# Patient Record
Sex: Female | Born: 1935
Health system: Southern US, Community
[De-identification: ages and names within clinical notes are randomized; demographics above are authoritative.]

## PROBLEM LIST (undated history)

## (undated) DIAGNOSIS — Z8619 Personal history of other infectious and parasitic diseases: Secondary | ICD-10-CM

## (undated) DIAGNOSIS — M545 Low back pain, unspecified: Secondary | ICD-10-CM

## (undated) DIAGNOSIS — E039 Hypothyroidism, unspecified: Secondary | ICD-10-CM

## (undated) DIAGNOSIS — H3551 Vitreoretinal dystrophy: Secondary | ICD-10-CM

## (undated) DIAGNOSIS — Z5189 Encounter for other specified aftercare: Secondary | ICD-10-CM

## (undated) DIAGNOSIS — E785 Hyperlipidemia, unspecified: Secondary | ICD-10-CM

## (undated) DIAGNOSIS — H7290 Unspecified perforation of tympanic membrane, unspecified ear: Secondary | ICD-10-CM

## (undated) DIAGNOSIS — H15003 Unspecified scleritis, bilateral: Secondary | ICD-10-CM

## (undated) DIAGNOSIS — M21379 Foot drop, unspecified foot: Secondary | ICD-10-CM

## (undated) DIAGNOSIS — I10 Essential (primary) hypertension: Secondary | ICD-10-CM

## (undated) DIAGNOSIS — N189 Chronic kidney disease, unspecified: Secondary | ICD-10-CM

## (undated) DIAGNOSIS — Z9079 Acquired absence of other genital organ(s): Secondary | ICD-10-CM

## (undated) DIAGNOSIS — B3781 Candidal esophagitis: Secondary | ICD-10-CM

## (undated) DIAGNOSIS — K259 Gastric ulcer, unspecified as acute or chronic, without hemorrhage or perforation: Secondary | ICD-10-CM

## (undated) DIAGNOSIS — F411 Generalized anxiety disorder: Secondary | ICD-10-CM

## (undated) DIAGNOSIS — M949 Disorder of cartilage, unspecified: Secondary | ICD-10-CM

## (undated) DIAGNOSIS — J309 Allergic rhinitis, unspecified: Secondary | ICD-10-CM

## (undated) DIAGNOSIS — M899 Disorder of bone, unspecified: Secondary | ICD-10-CM

## (undated) DIAGNOSIS — T7840XA Allergy, unspecified, initial encounter: Secondary | ICD-10-CM

## (undated) DIAGNOSIS — K862 Cyst of pancreas: Secondary | ICD-10-CM

## (undated) DIAGNOSIS — H269 Unspecified cataract: Secondary | ICD-10-CM

## (undated) DIAGNOSIS — K589 Irritable bowel syndrome without diarrhea: Secondary | ICD-10-CM

## (undated) DIAGNOSIS — M317 Microscopic polyangiitis: Secondary | ICD-10-CM

## (undated) HISTORY — DX: Encounter for other specified aftercare: Z51.89

## (undated) HISTORY — DX: Gastric ulcer, unspecified as acute or chronic, without hemorrhage or perforation: K25.9

## (undated) HISTORY — PX: TONSILLECTOMY: SUR1361

## (undated) HISTORY — DX: Unspecified cataract: H26.9

## (undated) HISTORY — DX: Essential (primary) hypertension: I10

## (undated) HISTORY — PX: COLONOSCOPY: SHX174

## (undated) HISTORY — DX: Allergy, unspecified, initial encounter: T78.40XA

## (undated) HISTORY — PX: ABDOMINAL HYSTERECTOMY: SHX81

## (undated) HISTORY — DX: Candidal esophagitis: B37.81

## (undated) HISTORY — DX: Generalized anxiety disorder: F41.1

## (undated) HISTORY — DX: Acquired absence of other genital organ(s): Z90.79

## (undated) HISTORY — DX: Hypothyroidism, unspecified: E03.9

## (undated) HISTORY — DX: Irritable bowel syndrome, unspecified: K58.9

## (undated) HISTORY — DX: Personal history of other infectious and parasitic diseases: Z86.19

## (undated) HISTORY — PX: UPPER GASTROINTESTINAL ENDOSCOPY: SHX188

## (undated) HISTORY — DX: Low back pain: M54.5

## (undated) HISTORY — DX: Unspecified perforation of tympanic membrane, unspecified ear: H72.90

## (undated) HISTORY — DX: Low back pain, unspecified: M54.50

## (undated) HISTORY — DX: Microscopic polyangiitis: M31.7

## (undated) HISTORY — PX: OTHER SURGICAL HISTORY: SHX169

## (undated) HISTORY — DX: Foot drop, unspecified foot: M21.379

## (undated) HISTORY — DX: Hyperlipidemia, unspecified: E78.5

## (undated) HISTORY — DX: Chronic kidney disease, unspecified: N18.9

## (undated) HISTORY — DX: Disorder of bone, unspecified: M89.9

## (undated) HISTORY — DX: Disorder of cartilage, unspecified: M94.9

## (undated) HISTORY — DX: Allergic rhinitis, unspecified: J30.9

## (undated) HISTORY — PX: POLYPECTOMY: SHX149

## (undated) HISTORY — DX: Cyst of pancreas: K86.2

## (undated) HISTORY — DX: Unspecified scleritis, bilateral: H15.003

---
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
## 1998-08-29 ENCOUNTER — Other Ambulatory Visit: Admission: RE | Admit: 1998-08-29 | Discharge: 1998-08-29 | Payer: Self-pay | Admitting: *Deleted

## 2000-08-13 ENCOUNTER — Other Ambulatory Visit: Admission: RE | Admit: 2000-08-13 | Discharge: 2000-08-13 | Payer: Self-pay | Admitting: *Deleted

## 2004-07-24 ENCOUNTER — Ambulatory Visit: Payer: Self-pay | Admitting: Pulmonary Disease

## 2004-07-25 ENCOUNTER — Ambulatory Visit: Payer: Self-pay | Admitting: Pulmonary Disease

## 2004-09-25 ENCOUNTER — Ambulatory Visit: Payer: Self-pay | Admitting: Pulmonary Disease

## 2004-10-23 ENCOUNTER — Ambulatory Visit: Payer: Self-pay | Admitting: Pulmonary Disease

## 2005-01-24 ENCOUNTER — Ambulatory Visit: Payer: Self-pay | Admitting: Pulmonary Disease

## 2005-04-25 ENCOUNTER — Ambulatory Visit: Payer: Self-pay | Admitting: Pulmonary Disease

## 2005-06-26 ENCOUNTER — Ambulatory Visit: Payer: Self-pay | Admitting: Pulmonary Disease

## 2005-07-25 ENCOUNTER — Ambulatory Visit: Payer: Self-pay | Admitting: Pulmonary Disease

## 2005-08-19 ENCOUNTER — Ambulatory Visit: Payer: Self-pay | Admitting: Pulmonary Disease

## 2006-03-27 ENCOUNTER — Ambulatory Visit: Payer: Self-pay | Admitting: Pulmonary Disease

## 2006-05-27 ENCOUNTER — Ambulatory Visit: Payer: Self-pay | Admitting: Pulmonary Disease

## 2006-07-25 ENCOUNTER — Ambulatory Visit: Payer: Self-pay | Admitting: Pulmonary Disease

## 2006-07-30 ENCOUNTER — Ambulatory Visit: Payer: Self-pay | Admitting: Family Medicine

## 2007-04-15 ENCOUNTER — Ambulatory Visit: Payer: Self-pay | Admitting: Pulmonary Disease

## 2007-04-29 ENCOUNTER — Ambulatory Visit: Payer: Self-pay | Admitting: Pulmonary Disease

## 2007-04-29 LAB — CONVERTED CEMR LAB
Bilirubin Urine: NEGATIVE
Crystals: NEGATIVE
Ketones, ur: NEGATIVE mg/dL
Mucus, UA: NEGATIVE
Nitrite: NEGATIVE
Squamous Epithelial / LPF: NEGATIVE /lpf
Total Protein, Urine: NEGATIVE mg/dL
Urine Glucose: NEGATIVE mg/dL

## 2007-05-11 DIAGNOSIS — Z9079 Acquired absence of other genital organ(s): Secondary | ICD-10-CM | POA: Insufficient documentation

## 2007-05-11 DIAGNOSIS — J309 Allergic rhinitis, unspecified: Secondary | ICD-10-CM | POA: Insufficient documentation

## 2007-05-11 DIAGNOSIS — K589 Irritable bowel syndrome without diarrhea: Secondary | ICD-10-CM | POA: Insufficient documentation

## 2007-05-11 DIAGNOSIS — I10 Essential (primary) hypertension: Secondary | ICD-10-CM

## 2007-05-11 DIAGNOSIS — M545 Low back pain, unspecified: Secondary | ICD-10-CM | POA: Insufficient documentation

## 2007-05-11 DIAGNOSIS — F411 Generalized anxiety disorder: Secondary | ICD-10-CM

## 2007-05-11 DIAGNOSIS — E785 Hyperlipidemia, unspecified: Secondary | ICD-10-CM

## 2007-05-26 ENCOUNTER — Ambulatory Visit: Payer: Self-pay | Admitting: Pulmonary Disease

## 2007-07-27 ENCOUNTER — Ambulatory Visit: Payer: Self-pay | Admitting: Pulmonary Disease

## 2007-07-27 DIAGNOSIS — M8580 Other specified disorders of bone density and structure, unspecified site: Secondary | ICD-10-CM

## 2007-08-08 DIAGNOSIS — H7290 Unspecified perforation of tympanic membrane, unspecified ear: Secondary | ICD-10-CM | POA: Insufficient documentation

## 2007-08-08 LAB — CONVERTED CEMR LAB
Albumin: 4.3 g/dL (ref 3.5–5.2)
Basophils Absolute: 0 10*3/uL (ref 0.0–0.1)
CO2: 29 meq/L (ref 19–32)
Cholesterol: 177 mg/dL (ref 0–200)
Eosinophils Absolute: 0.1 10*3/uL (ref 0.0–0.6)
GFR calc Af Amer: 79 mL/min
Glucose, Bld: 114 mg/dL — ABNORMAL HIGH (ref 70–99)
MCV: 90.8 fL (ref 78.0–100.0)
Monocytes Absolute: 0.5 10*3/uL (ref 0.2–0.7)
Platelets: 272 10*3/uL (ref 150–400)
Potassium: 4 meq/L (ref 3.5–5.1)
RBC: 4.23 M/uL (ref 3.87–5.11)
TSH: 1.92 microintl units/mL (ref 0.35–5.50)
Total Bilirubin: 0.8 mg/dL (ref 0.3–1.2)
Total Protein: 7.2 g/dL (ref 6.0–8.3)
WBC: 5.9 10*3/uL (ref 4.5–10.5)

## 2007-12-24 ENCOUNTER — Encounter: Payer: Self-pay | Admitting: Pulmonary Disease

## 2008-01-25 ENCOUNTER — Ambulatory Visit: Payer: Self-pay | Admitting: Pulmonary Disease

## 2008-04-27 ENCOUNTER — Ambulatory Visit: Payer: Self-pay | Admitting: Pulmonary Disease

## 2008-07-26 ENCOUNTER — Ambulatory Visit: Payer: Self-pay | Admitting: Pulmonary Disease

## 2008-07-27 ENCOUNTER — Encounter: Payer: Self-pay | Admitting: Pulmonary Disease

## 2008-07-27 ENCOUNTER — Ambulatory Visit: Payer: Self-pay | Admitting: Internal Medicine

## 2008-08-03 LAB — CONVERTED CEMR LAB
Albumin: 4.5 g/dL (ref 3.5–5.2)
Alkaline Phosphatase: 39 units/L (ref 39–117)
Bilirubin Urine: NEGATIVE
Bilirubin, Direct: 0.1 mg/dL (ref 0.0–0.3)
CO2: 30 meq/L (ref 19–32)
Crystals: NEGATIVE
Glucose, Bld: 118 mg/dL — ABNORMAL HIGH (ref 70–99)
HDL: 54.9 mg/dL (ref >39.0)
Hemoglobin, Urine: NEGATIVE
Hemoglobin: 13.8 g/dL (ref 12.0–15.0)
Lymphocytes Relative: 37.4 % (ref 12.0–46.0)
MCHC: 34.8 g/dL (ref 30.0–36.0)
Monocytes Absolute: 0.4 10*3/uL (ref 0.1–1.0)
Neutro Abs: 2.8 10*3/uL (ref 1.4–7.7)
Nitrite: NEGATIVE
Platelets: 241 10*3/uL (ref 150–400)
RBC / HPF: NONE SEEN
RBC: 4.35 M/uL (ref 3.87–5.11)
TSH: 2.01 microintl units/mL (ref 0.35–5.50)
Total Protein, Urine: NEGATIVE mg/dL
Total Protein: 7.4 g/dL (ref 6.0–8.3)
Triglycerides: 149 mg/dL (ref 0–149)

## 2008-08-10 ENCOUNTER — Telehealth: Payer: Self-pay | Admitting: Pulmonary Disease

## 2008-12-27 ENCOUNTER — Encounter: Payer: Self-pay | Admitting: Pulmonary Disease

## 2009-01-23 ENCOUNTER — Ambulatory Visit: Payer: Self-pay | Admitting: Pulmonary Disease

## 2009-01-23 LAB — CONVERTED CEMR LAB: Vit D, 25-Hydroxy: 45 ng/mL (ref 30–89)

## 2009-02-13 LAB — CONVERTED CEMR LAB
BUN: 16 mg/dL (ref 6–23)
Calcium: 10 mg/dL (ref 8.4–10.5)
Chloride: 104 meq/L (ref 96–112)
Hgb A1c MFr Bld: 6.4 % (ref 4.6–6.5)

## 2009-05-12 ENCOUNTER — Ambulatory Visit: Payer: Self-pay | Admitting: Pulmonary Disease

## 2009-07-27 ENCOUNTER — Ambulatory Visit: Payer: Self-pay | Admitting: Pulmonary Disease

## 2009-08-02 ENCOUNTER — Telehealth (INDEPENDENT_AMBULATORY_CARE_PROVIDER_SITE_OTHER): Payer: Self-pay | Admitting: *Deleted

## 2009-08-31 ENCOUNTER — Telehealth: Payer: Self-pay | Admitting: Pulmonary Disease

## 2009-09-11 ENCOUNTER — Telehealth (INDEPENDENT_AMBULATORY_CARE_PROVIDER_SITE_OTHER): Payer: Self-pay | Admitting: *Deleted

## 2009-10-26 ENCOUNTER — Telehealth: Payer: Self-pay | Admitting: Pulmonary Disease

## 2009-11-09 ENCOUNTER — Telehealth (INDEPENDENT_AMBULATORY_CARE_PROVIDER_SITE_OTHER): Payer: Self-pay | Admitting: *Deleted

## 2009-11-09 ENCOUNTER — Ambulatory Visit: Payer: Self-pay | Admitting: Pulmonary Disease

## 2009-11-09 LAB — CONVERTED CEMR LAB
BUN: 14 mg/dL (ref 6–23)
Eosinophils Relative: 1.5 % (ref 0.0–5.0)
Monocytes Relative: 9.1 % (ref 3.0–12.0)
Platelets: 269 10*3/uL (ref 150.0–400.0)
Potassium: 4.4 meq/L (ref 3.5–5.1)
RBC: 4.12 M/uL (ref 3.87–5.11)
Sodium: 141 meq/L (ref 135–145)
WBC: 5.3 10*3/uL (ref 4.5–10.5)

## 2009-11-15 ENCOUNTER — Encounter: Payer: Self-pay | Admitting: Cardiology

## 2009-11-16 ENCOUNTER — Ambulatory Visit: Payer: Self-pay | Admitting: Cardiology

## 2009-12-06 ENCOUNTER — Ambulatory Visit: Payer: Self-pay | Admitting: Cardiology

## 2009-12-06 ENCOUNTER — Ambulatory Visit: Payer: Self-pay

## 2009-12-06 ENCOUNTER — Encounter: Payer: Self-pay | Admitting: Cardiology

## 2009-12-06 ENCOUNTER — Ambulatory Visit (HOSPITAL_COMMUNITY): Admission: RE | Admit: 2009-12-06 | Discharge: 2009-12-06 | Payer: Self-pay | Admitting: Cardiology

## 2009-12-15 ENCOUNTER — Ambulatory Visit: Payer: Self-pay | Admitting: Pulmonary Disease

## 2009-12-22 ENCOUNTER — Ambulatory Visit: Payer: Self-pay | Admitting: Cardiology

## 2010-02-07 ENCOUNTER — Encounter: Payer: Self-pay | Admitting: Pulmonary Disease

## 2010-04-03 ENCOUNTER — Telehealth (INDEPENDENT_AMBULATORY_CARE_PROVIDER_SITE_OTHER): Payer: Self-pay | Admitting: *Deleted

## 2010-04-26 ENCOUNTER — Telehealth (INDEPENDENT_AMBULATORY_CARE_PROVIDER_SITE_OTHER): Payer: Self-pay | Admitting: *Deleted

## 2010-05-02 ENCOUNTER — Ambulatory Visit: Payer: Self-pay | Admitting: Pulmonary Disease

## 2010-05-10 ENCOUNTER — Ambulatory Visit: Payer: Self-pay | Admitting: Pulmonary Disease

## 2010-05-10 LAB — CONVERTED CEMR LAB
Albumin: 4.3 g/dL (ref 3.5–5.2)
Alkaline Phosphatase: 57 units/L (ref 39–117)
BUN: 15 mg/dL (ref 6–23)
Basophils Absolute: 0 10*3/uL (ref 0.0–0.1)
Basophils Relative: 0.7 % (ref 0.0–3.0)
Calcium: 9.5 mg/dL (ref 8.4–10.5)
Chloride: 105 meq/L (ref 96–112)
Eosinophils Relative: 1.8 % (ref 0.0–5.0)
GFR calc non Af Amer: 67.48 mL/min (ref >60)
Glucose, Bld: 97 mg/dL (ref 70–99)
HCT: 39.8 % (ref 36.0–46.0)
Hemoglobin: 13.5 g/dL (ref 12.0–15.0)
Lymphocytes Relative: 40.1 % (ref 12.0–46.0)
MCHC: 33.8 g/dL (ref 30.0–36.0)
MCV: 92.2 fL (ref 78.0–100.0)
Monocytes Absolute: 0.4 10*3/uL (ref 0.1–1.0)
Neutrophils Relative %: 48.5 % (ref 43.0–77.0)
Platelets: 260 10*3/uL (ref 150.0–400.0)
Potassium: 5.4 meq/L — ABNORMAL HIGH (ref 3.5–5.1)
RDW: 13.8 % (ref 11.5–14.6)
Sodium: 141 meq/L (ref 135–145)
TSH: 4.27 microintl units/mL (ref 0.35–5.50)
Total Bilirubin: 0.6 mg/dL (ref 0.3–1.2)
Total Protein: 6.9 g/dL (ref 6.0–8.3)
WBC: 4.9 10*3/uL (ref 4.5–10.5)

## 2010-05-15 ENCOUNTER — Encounter: Payer: Self-pay | Admitting: Pulmonary Disease

## 2010-08-26 LAB — CONVERTED CEMR LAB
ALT: 18 units/L (ref 0–35)
Albumin: 4.3 g/dL (ref 3.5–5.2)
Alkaline Phosphatase: 56 units/L (ref 39–117)
Basophils Relative: 0.7 % (ref 0.0–3.0)
Bilirubin, Direct: 0.1 mg/dL (ref 0.0–0.3)
Calcium: 9.7 mg/dL (ref 8.4–10.5)
Chloride: 104 meq/L (ref 96–112)
Eosinophils Absolute: 0.1 10*3/uL (ref 0.0–0.7)
Eosinophils Relative: 1.4 % (ref 0.0–5.0)
HDL: 54.7 mg/dL (ref >39.00)
Hemoglobin: 13.3 g/dL (ref 12.0–15.0)
LDL Cholesterol: 94 mg/dL (ref 0–99)
Lymphocytes Relative: 31.5 % (ref 12.0–46.0)
Lymphs Abs: 1.8 10*3/uL (ref 0.7–4.0)
Monocytes Absolute: 0.5 10*3/uL (ref 0.1–1.0)
Monocytes Relative: 8.7 % (ref 3.0–12.0)
Potassium: 4.2 meq/L (ref 3.5–5.1)
RDW: 12.8 % (ref 11.5–14.6)
Total Protein: 7.6 g/dL (ref 6.0–8.3)
Triglycerides: 190 mg/dL — ABNORMAL HIGH (ref 0.0–149.0)
VLDL: 38 mg/dL (ref 0.0–40.0)
WBC: 5.6 10*3/uL (ref 4.5–10.5)

## 2010-08-28 NOTE — Progress Notes (Signed)
Summary: bp med-line busy  Phone Note Call from Patient Call back at [facility] Phone [phone]   Caller: Patient Call For: Clebert Wenger Reason for Call: Talk to Nurse, Talk to Doctor Summary of Call: pt is taking Diovan HCT - c/o gums swollen, mouth dry, throat dry.  Her son, [name], Pharmacist told her to ask SN if she could switch to Avapro or Avapro HCTZ.  Pt asked that you call her son also - [phone] Initial call taken by: Zigmund Gottron,  October 26, 2009 9:34 AM  Follow-up for Phone Call        Pt c/o swollen gums, raspy voice and that her mouth was extremely dry. She states this all started after she started taking diovan hct x 3 weeks ago. She denies any swelling of her lips or tongue and no difficulty breathing.  Pt states her son is a Electrical engineer and he is rec she change to Avapro or Avapro HCT. Pt uses [facility] on [address]. Please advise. Wharton Bing CMA  October 26, 2009 9:42 AM   Additional Follow-up for Phone Call Additional follow up Details #1::        per SN---ok to change to avalide 150/12.5  #30  1 by mouth once daily refill x 1.  she will need rov with SN in 4-6 wks to recheck her BP on the new med and see how she is doing.  thanks Bartlett  October 26, 2009 2:30 PM   ATC patient, line busy. WCB Bartlett Bing CMA  October 26, 2009 3:09 PM     Additional Follow-up for Phone Call Additional follow up Details #2::    pt advised. [place] Bing CMA  October 26, 2009 3:46 PM   New/Updated Medications: AVALIDE 150-12.5 MG TABS (IRBESARTAN-HYDROCHLOROTHIAZIDE) Take 1 tablet by mouth once a day Prescriptions: AVALIDE 150-12.5 MG TABS (IRBESARTAN-HYDROCHLOROTHIAZIDE) Take 1 tablet by mouth once a day  #30 x 5   Entered by:   [place] Bing CMA   Authorized by:   Noralee Space MD   Signed by:   Merrill Bing CMA on 10/26/2009   Method used:   Electronically to        [facility]. [phone]* (retail)       [address]  [place], [place]  [address]       Ph: [phone]       Fax: [phone]   RxID:   [id number]

## 2010-08-28 NOTE — Progress Notes (Signed)
Summary: alprazolam refilled  Phone Note From Pharmacy Call back at [phone]   Caller: [place].* Call For: nadel  Summary of Call: Requests refill on alprazolam 0.25mg  #100. Initial call taken by: Netta Neat,  April 03, 2010 3:59 PM  Follow-up for Phone Call        Refill was called to pharm.   Follow-up by: Tilden Dome,  April 03, 2010 4:18 PM    Prescriptions: ALPRAZOLAM 0.25 MG  TABS (ALPRAZOLAM) 1 tab by mouth three times a day as needed for anxiety  #90 x 1   Entered by:   Tilden Dome   Authorized by:   Noralee Space MD   Signed by:   Tilden Dome on 04/03/2010   Method used:   Telephoned to ...       [facility] (retail)       [address]       [place],   [address]       Ph: [phone]       Fax: [phone]   RxID:   [id number]

## 2010-08-28 NOTE — Cardiovascular Report (Signed)
Summary: Outpatient Coinsurance Notice   Outpatient Coinsurance Notice   Imported By: Sallee Provencal 12/11/2009 12:41:53  _____________________________________________________________________  External Attachment:    Type:   Image     Comment:   External Document

## 2010-08-28 NOTE — Assessment & Plan Note (Signed)
Summary: chest pain    Visit Type:  Initial Consult Primary Provider:  Teressa Lower, MD  CC:  presyncope.  History of Present Illness: The patient is referred for the evaluation of presyncope.  In general she is stable.  There is a history of hypertension and hyperlipidemia.  She's had some mild irritable bowel.  She's also had some low back pain.  Recently she was very busy helping take care of her husband and it was a very warm day.  She began to move and realized that she had some dizziness and presyncope.  She sat down and stabilized.  She did not have any chest pain.  Current Medications (verified): 1)  Adult Aspirin Low Strength 81 Mg  Tbdp (Aspirin) .... Take 1 Tablet By Mouth Once A Day 2)  Metoprolol Succinate 50 Mg  Tb24 (Metoprolol Succinate) .... Take 1 Tab By Mouth Once Daily.Marland KitchenMarland Kitchen 3)  Lipitor 20 Mg  Tabs (Atorvastatin Calcium) .... Take 1 Tablet By Mouth Once A Day 4)  Gemfibrozil 600 Mg  Tabs (Gemfibrozil) .... Take 1 Tablet By Mouth Once A Day 5)  Calcium 600 + D 600-400 Mg-Unit  Tabs (Calcium Carbonate-Vitamin D) .Marland Kitchen.. 1 Tab Twice Daily 6)  Multivitamins   Tabs (Multiple Vitamin) .... Take 1 Tablet By Mouth Once A Day 7)  Vitamin D 1000 Unit Tabs (Cholecalciferol) .... Take 1 Cap Daily.Marland KitchenMarland Kitchen 8)  Alprazolam 0.25 Mg  Tabs (Alprazolam) .Marland Kitchen.. 1 Tab By Mouth Three Times A Day As Needed For Anxiety 9)  Tylenol Pm Extra Strength 500-25 Mg  Tabs (Diphenhydramine-Apap (Sleep)) .... As Needed 10)  Amlodipine Besylate 5 Mg Tabs (Amlodipine Besylate) .Marland Kitchen.. 1 By Mouth Once Daily 11)  Hydrochlorothiazide 12.5 Mg Tabs (Hydrochlorothiazide) .Marland Kitchen.. 1 By Mouth Once Daily  Allergies: 1)  ! Codeine 2)  ! Amoxicillin 3)  ! Fosamax (Alendronate Sodium) 4)  ! Diovan  Past History:  Past Medical History: Last updated: 11/15/2009 HEALTH MAINTENANCE - she sees [facility] for GYN- PAP & Mammogram are up-to-date... BMD here 1/08 & 12/09 as noted... colonoscopy 2/04 by DrPatterson w/ rec for f/u in 10  years... she's had her 2010 Flu shot this season, had Pneumovax 5 yrs ago at age 72... OK Tdap today.  Hx of Perforated TM 1999 (w/ a paper clip!) - eval by DrKraus & DrDeaton...  ~  exam 12/09 showed cerumen impactions and she will f/u w/ ENT for removal...  HYPERTENSION (ICD-401.9) - controlled on ASA 81mg /d, TOPROL XL 50mg /d,  --allergic rx to [place] 10/2009, stopped ARBs changed to   HYPERLIPIDEMIA (ICD-272.4) - controlled on LIPITOR 20mg /d & GEMFIB 600mg /d... takes these regularly & no side effects...   ~  [place] 12/07 showed TChol 193, TG 160, HDL 52, LDL 109  ~  FLP 12/08 showed TChol 177, TG 171, HDL 50, LDL 92... continue same meds.  ~  [place] 12/09 showed TChol 193, TG 149, HDL 55, LDL 108  ~  FLP 12/10 showed TChol 187, TG 190, HDL 55, LDL 94  IRRITABLE BOWEL SYNDROME (ICD-564.1) - last colon 2/04 by DrPatterson was normal (f/u rec for 73yrs)... hx of hemorroids and constipation in the past... denies nausea, vomiting, heartburn, diarrhea, constipation, blood in stool, abdominal pain, swelling, gas...  ~  For Constipation-  rec taking Senakot-S & Miralax daily...  LOW BACK PAIN (ICD-724.2) - she has hx of HNP, manages OK w/ exercise and Tylenol. OSTEOPENIA (ICD-733.90) - she stopped Fosamax due to side effects but tolerated BONIVA (on hold now w/ improved BMD).Marland KitchenMarland Kitchen  also takes ca++, MVI, Vit D 1000 daily.  ~  BMD 1/08 showed TScores -0.6 in spine, and -1.4 in fem neck...  ~  BMD 12/09 showed TScores +1.0 in Spine & -0.8 in left FemNeck... rec> stop Boniva.  ~  12/10:  no bone symptoms- on drug holiday at present...  ANXIETY (ICD-300.00) - uses ALPRAZOLAM 0.25mg  Prn...  CHEST PAIN (ICD-786.50) Dizziness / presyncope  10/2009 No Echo as of 11/15/2009  Review of Systems       Patient denies fever, chills, headache, sweats, rash, change in vision, change in hearing, chest pain, nausea vomiting, urinary symptoms.  All other systems are reviewed and are negative.  Vital Signs:  Patient  profile:   77 year old female Height:      61 inches Weight:      133.25 pounds BMI:     25.27 Pulse rate:   64 / minute Pulse rhythm:   regular Resp:     18 per minute BP sitting:   140 / 74  (left arm) Cuff size:   large  Vitals Entered By: Sidney Ace (November 16, 2009 2:52 PM)  Physical Exam  General:  patient is quite stable. Head:  head is atraumatic. Eyes:  no xanthelasma. Neck:  no jugular venous distention.  No carotid bruits. Chest Wall:  no chest wall tenderness. Lungs:  lungs are clear.  Respiratory effort is nonlabored. Heart:  cardiac exam reveals S1 and S2.  No clicks or significant murmurs. Abdomen:  abdomen is soft. Msk:  no musculoskeletal deformities. Extremities:  no peripheral edema. Skin:  no skin rashes. Psych:  patient is oriented to person time and place.  Affect is normal.   Impression & Recommendations:  Problem # 1:  * DIZZINESS / PRESYNCOPE  10/2009 I suspect that the patient's episode of dizziness and presyncope was probably due to mild dehydration.  She seems quite stable at this point.  I have reviewed EKG in the computer and is normal.  I decided to proceed with a 2-D echo to be sure that there is no structural heart disease.  In the meantime she will return to normal activities and be careful to keep herself hydrated.  Problem # 2:  HYPERTENSION (ICD-401.9)  Her updated medication list for this problem includes:    Adult Aspirin Low Strength 81 Mg Tbdp (Aspirin) .Marland Kitchen... Take 1 tablet by mouth once a day    Metoprolol Succinate 50 Mg Tb24 (Metoprolol succinate) .Marland Kitchen... Take 1 tab by mouth once daily...    Amlodipine Besylate 5 Mg Tabs (Amlodipine besylate) .Marland Kitchen... 1 by mouth once daily    Hydrochlorothiazide 12.5 Mg Tabs (Hydrochlorothiazide) .Marland Kitchen... 1 by mouth once daily Blood pressure is under good control.  No change in therapy.  Problem # 3:  HYPERLIPIDEMIA (B2193296.4)  Her updated medication list for this problem includes:    Lipitor 20 Mg  Tabs (Atorvastatin calcium) .Marland Kitchen... Take 1 tablet by mouth once a day    Gemfibrozil 600 Mg Tabs (Gemfibrozil) .Marland Kitchen... Take 1 tablet by mouth once a day Lipids are being treated.  No change in therapy.  Other Orders: Echocardiogram (Echo)  Patient Instructions: 1)  Your physician has requested that you have an echocardiogram.  Echocardiography is a painless test that uses sound waves to create images of your heart. It provides your doctor with information about the size and shape of your heart and how well your heart's chambers and valves are working.  This procedure takes approximately one hour. There are  no restrictions for this procedure. 2)  Follow up after echo

## 2010-08-28 NOTE — Miscellaneous (Signed)
Clinical Lists Changes  Problems: Added new problem of * DIZZINESS / PRESYNCOPE  10/2009 Observations: Added new observation of PAST MED HX: HEALTH MAINTENANCE - she sees [place] for GYN- PAP & Mammogram are up-to-date... BMD here 1/08 & 12/09 as noted... colonoscopy 2/04 by DrPatterson w/ rec for f/u in 10 years... she's had her 2010 Flu shot this season, had Pneumovax 5 yrs ago at age 77... OK Tdap today.  Hx of Perforated TM 1999 (w/ a paper clip!) - eval by DrKraus & DrDeaton...  ~  exam 12/09 showed cerumen impactions and she will f/u w/ ENT for removal...  HYPERTENSION (ICD-401.9) - controlled on ASA 81mg /d, TOPROL XL 50mg /d,  --allergic rx to [place] 10/2009, stopped ARBs changed to   HYPERLIPIDEMIA (ICD-272.4) - controlled on LIPITOR 20mg /d & GEMFIB 600mg /d... takes these regularly & no side effects...   ~  [place] 12/07 showed TChol 193, TG 160, HDL 52, LDL 109  ~  FLP 12/08 showed TChol 177, TG 171, HDL 50, LDL 92... continue same meds.  ~  [place] 12/09 showed TChol 193, TG 149, HDL 55, LDL 108  ~  FLP 12/10 showed TChol 187, TG 190, HDL 55, LDL 94  IRRITABLE BOWEL SYNDROME (ICD-564.1) - last colon 2/04 by DrPatterson was normal (f/u rec for 4yrs)... hx of hemorroids and constipation in the past... denies nausea, vomiting, heartburn, diarrhea, constipation, blood in stool, abdominal pain, swelling, gas...  ~  For Constipation-  rec taking Senakot-S & Miralax daily...  LOW BACK PAIN (ICD-724.2) - she has hx of HNP, manages OK w/ exercise and Tylenol. OSTEOPENIA (ICD-733.90) - she stopped Fosamax due to side effects but tolerated BONIVA (on hold now w/ improved BMD)... also takes ca++, MVI, Vit D 1000 daily.  ~  BMD 1/08 showed TScores -0.6 in spine, and -1.4 in fem neck...  ~  BMD 12/09 showed TScores +1.0 in Spine & -0.8 in left FemNeck... rec> stop Boniva.  ~  12/10:  no bone symptoms- on drug holiday at present...  ANXIETY (ICD-300.00) - uses ALPRAZOLAM 0.25mg  Prn...  CHEST PAIN  (ICD-786.50) Dizziness / presyncope  10/2009 No Echo as of 11/15/2009  (11/15/2009 11:53)       Past History:  Past Medical History: HEALTH MAINTENANCE - she sees [facility] for GYN- PAP & Mammogram are up-to-date... BMD here 1/08 & 12/09 as noted... colonoscopy 2/04 by DrPatterson w/ rec for f/u in 10 years... she's had her 2010 Flu shot this season, had Pneumovax 5 yrs ago at age 77... OK Tdap today.  Hx of Perforated TM 1999 (w/ a paper clip!) - eval by DrKraus & DrDeaton...  ~  exam 12/09 showed cerumen impactions and she will f/u w/ ENT for removal...  HYPERTENSION (ICD-401.9) - controlled on ASA 81mg /d, TOPROL XL 50mg /d,  --allergic rx to [place] 10/2009, stopped ARBs changed to   HYPERLIPIDEMIA (ICD-272.4) - controlled on LIPITOR 20mg /d & GEMFIB 600mg /d... takes these regularly & no side effects...   ~  [place] 12/07 showed TChol 193, TG 160, HDL 52, LDL 109  ~  FLP 12/08 showed TChol 177, TG 171, HDL 50, LDL 92... continue same meds.  ~  [place] 12/09 showed TChol 193, TG 149, HDL 55, LDL 108  ~  FLP 12/10 showed TChol 187, TG 190, HDL 55, LDL 94  IRRITABLE BOWEL SYNDROME (ICD-564.1) - last colon 2/04 by DrPatterson was normal (f/u rec for 105yrs)... hx of hemorroids and constipation in the past... denies nausea, vomiting, heartburn, diarrhea, constipation, blood in stool, abdominal  pain, swelling, gas...  ~  For Constipation-  rec taking Senakot-S & Miralax daily...  LOW BACK PAIN (ICD-724.2) - she has hx of HNP, manages OK w/ exercise and Tylenol. OSTEOPENIA (ICD-733.90) - she stopped Fosamax due to side effects but tolerated BONIVA (on hold now w/ improved BMD)... also takes ca++, MVI, Vit D 1000 daily.  ~  BMD 1/08 showed TScores -0.6 in spine, and -1.4 in fem neck...  ~  BMD 12/09 showed TScores +1.0 in Spine & -0.8 in left FemNeck... rec> stop Boniva.  ~  12/10:  no bone symptoms- on drug holiday at present...  ANXIETY (ICD-300.00) - uses ALPRAZOLAM 0.25mg  Prn...  CHEST PAIN  (ICD-786.50) Dizziness / presyncope  10/2009 No Echo as of 11/15/2009

## 2010-08-28 NOTE — Progress Notes (Signed)
Summary: Fasting labs > in IDX   Phone Note Call from Patient Call back at Home Phone [phone]   Caller: Patient Call For: Lenna Gilford Reason for Call: Talk to Nurse Summary of Call: Pt states was advised by Dr. Lenna Gilford at last OV to call in October to get scheduled for fastings labs. Pt's appointment is 05/10/2010 and wants to come in 05/02/2010 for labs.  Initial call taken by: Iran Planas CMA,  April 26, 2010 10:26 AM  Follow-up for Phone Call        Pls advise what labs to be ordered and I will put in [place], thanks! Tilden Dome  April 26, 2010 10:38 AM  ATC x2, line busy.  labs placed in IDX for 10.5.11 as requested by pt. Parke Poisson CNA/MA  April 26, 2010 4:54 PM   Additional Follow-up for Phone Call Additional follow up Details #1::        Spoke with pt.  She is aware fasting labs in system for 10.5.11.  She verbalized understanding. Additional Follow-up by: Raymondo Band RN,  April 27, 2010 12:25 PM

## 2010-08-28 NOTE — Assessment & Plan Note (Signed)
Summary: f/u echo done 12-06-09/sl   Visit Type:  Follow-up Primary Provider:  Teressa Lower, MD  CC:  presyncope.  History of Present Illness: The patient is seen for followup of presyncope.  I saw her last November 16, 2009.  At that time her presyncopal episode was thought to possibly have been from some mild dehydration.  She has returned to full activity. She has not had any recurring symptoms.  Two-dimensional echo was done and I have reviewed it completely.  She has excellent LV function and no wall motion abnormalities.  There no valvular abnormality.  Current Medications (verified): 1)  Adult Aspirin Low Strength 81 Mg  Tbdp (Aspirin) .... Take 1 Tablet By Mouth Once A Day 2)  Metoprolol Succinate 50 Mg  Tb24 (Metoprolol Succinate) .... Take 1 Tab By Mouth Once Daily.Marland KitchenMarland Kitchen 3)  Amlodipine Besylate 5 Mg Tabs (Amlodipine Besylate) .Marland Kitchen.. 1 By Mouth Once Daily 4)  Hydrochlorothiazide 12.5 Mg Tabs (Hydrochlorothiazide) .Marland Kitchen.. 1 By Mouth Once Daily 5)  Lipitor 20 Mg  Tabs (Atorvastatin Calcium) .... Take 1 Tablet By Mouth Once A Day 6)  Gemfibrozil 600 Mg  Tabs (Gemfibrozil) .... Take 1 Tablet By Mouth Once A Day 7)  Calcium 600 + D 600-400 Mg-Unit  Tabs (Calcium Carbonate-Vitamin D) .Marland Kitchen.. 1 Tab Twice Daily 8)  Multivitamins   Tabs (Multiple Vitamin) .... Take 1 Tablet By Mouth Once A Day 9)  Vitamin D 1000 Unit Tabs (Cholecalciferol) .... Take 1 Cap Daily... 10)  Alprazolam 0.25 Mg  Tabs (Alprazolam) .Marland Kitchen.. 1 Tab By Mouth Three Times A Day As Needed For Anxiety 11)  Tylenol Pm Extra Strength 500-25 Mg  Tabs (Diphenhydramine-Apap (Sleep)) .... As Needed  Allergies: 1)  ! Codeine 2)  ! Amoxicillin 3)  ! Fosamax (Alendronate Sodium) 4)  ! Diovan  Past History:  Past Medical History: EF  65%... echo... May, 2011 Presyncope.... 2011.... 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  All other systems are reviewed and are negative  Vital Signs:  Patient profile:   77 year old female Height:      61 inches Weight:      133 pounds BMI:     25.22 Pulse rate:   56 / minute Pulse rhythm:   regular Resp:     18 per minute BP sitting:   142 / 70  (left arm) Cuff size:   regular  Vitals Entered By: Sidney Ace (Dec 22, 2009 11:25 AM)  Physical Exam  General:  patient is stable and reassured today. Eyes:  no xanthelasma. Neck:  no jugular venous distention. Lungs:  lungs are clear respiratory effort is not labored. Heart:  cardiac exam reveals S1 and S2.  No clicks or significant murmurs. Abdomen:  abdomen is soft. Extremities:  no peripheral edema. Psych:  patient is oriented to person time and place.  Affect is normal.   Impression & Recommendations:  Problem # 1:  * DIZZINESS / PRESYNCOPE  10/2009 The patient is improved.  She has normal LV function and no valvular abnormalities..  No further workup is needed.  Problem # 2:  HYPERTENSION (ICD-401.9)  Her updated medication list for this problem includes:  Adult Aspirin Low Strength 81 Mg Tbdp (Aspirin) .Marland Kitchen... Take 1 tablet by mouth once a day    Metoprolol Succinate 50 Mg Tb24 (Metoprolol succinate) .Marland Kitchen... Take 1 tab by mouth once daily...    Amlodipine Besylate 5 Mg Tabs (Amlodipine besylate) .Marland Kitchen... 1 by mouth once daily    Hydrochlorothiazide 12.5 Mg Tabs (Hydrochlorothiazide) .Marland Kitchen... 1 by mouth once daily Blood pressure is stable today.  She will discuss with Dr.Nadel whether any of her medicines will be changed over time.

## 2010-08-28 NOTE — Assessment & Plan Note (Signed)
Summary: Acute NP office visit - dizzy/fainting spell   CC:  pt was walking Monday, states when she was about 1 block from home she became dizzy and almost fainted; went immediately home, became nauseas/perspired.  pt states she has changed BP meds several times this past month from avalide 300/12.5 (which was dc'd), and to diovan 320/12.5 (allergic reaction) to avalide 150/12.5 which she has been on x2weeks.  History of Present Illness: [name]  with known hx of    ~  Jan08:  she had BMD that showed osteopenia in the femoral neck w/ TScore -1.4 (it was -1.1 in 2005)... started on FOSAMAX but stopped it due to "SOB- I have a low tolerance to meds"... ?SOB improved off the Fosamax & she was switched to Boniva- tol well so far.  ~  Dec09:  doing satis- dieting, exercising, etc... no new complaints or concerns... wonders about coming off the bisphos therapy for a drug holiday... repeat BMD 12/09 showed improvement & normal TScores therefore Boniva stopped... Vit D was low at 19 and 50000 u weekly started along w/ her Calcium & MVI...   ~  January 23, 2009:  46mo follow up doing well- had Mammogram that was neg, & BMD here 12/09 that showed normal TScores therefore Boniva stopped... she's been on Vit D 50K weekly & f/u Vit D today = 45 therefore switch to 1000 u OTC daily...   ~  July 27, 2009:  doing well on meds w/o new complaints or concerns x sinus drainage & we discussed this & appropriate OTC Rx... needs refill of all meds for 30d supplies... she's had the 2010 Flu vaccine already & Pneumovax in past... OK Tdap today.  November 09, 2009--Presents for an acute office visit. pt was walking Monday, states when she was about 1 block from home she became dizzy and almost fainted; went immediately home, became nauseas/perspired.  pt states she has changed BP meds several times this past month from avalide 300/12.5 (which was dc'd), to diovan 320/12.5 (allergic reaction) to avalide 150/12.5 which she has  been on x2weeks. On Diovan had gum/throat swelling, went back to Avalide w/ no problems.  She walks on average 2 miles 4x/d. She was overheated outside, after she rested for a while. Symptoms went away. B/P was 116/47. Denies  dyspnea, orthopnea, hemoptysis, fever, n/v/d, edema, headache,palpitations or chest pain, headache.       Medications Prior to Update: 1)  Adult Aspirin Low Strength 81 Mg  Tbdp (Aspirin) .... Take 1 Tablet By Mouth Once A Day 2)  Metoprolol Succinate 50 Mg  Tb24 (Metoprolol Succinate) .... Take 1 Tab By Mouth Once Daily.Marland KitchenMarland Kitchen 3)  Lipitor 20 Mg  Tabs (Atorvastatin Calcium) .... Take 1 Tablet By Mouth Once A Day 4)  Gemfibrozil 600 Mg  Tabs (Gemfibrozil) .... Take 1 Tablet By Mouth Once A Day 5)  Calcium 600 + D 600-400 Mg-Unit  Tabs (Calcium Carbonate-Vitamin D) .Marland Kitchen.. 1 Tab Twice Daily 6)  Multivitamins   Tabs (Multiple Vitamin) .... Take 1 Tablet By Mouth Once A Day 7)  Vitamin D 1000 Unit Tabs (Cholecalciferol) .... Take 1 Cap Daily.Marland KitchenMarland Kitchen 8)  Alprazolam 0.25 Mg  Tabs (Alprazolam) .Marland Kitchen.. 1 Tab By Mouth Three Times A Day As Needed For Anxiety 9)  Tylenol Pm Extra Strength 500-25 Mg  Tabs (Diphenhydramine-Apap (Sleep)) .... As Needed 10)  Avalide 150-12.5 Mg Tabs (Irbesartan-Hydrochlorothiazide) .... Take 1 Tablet By Mouth Once A Day  Current Medications (verified): 1)  Adult Aspirin  Low Strength 81 Mg  Tbdp (Aspirin) .... Take 1 Tablet By Mouth Once A Day 2)  Metoprolol Succinate 50 Mg  Tb24 (Metoprolol Succinate) .... Take 1 Tab By Mouth Once Daily.Marland KitchenMarland Kitchen 3)  Lipitor 20 Mg  Tabs (Atorvastatin Calcium) .... Take 1 Tablet By Mouth Once A Day 4)  Gemfibrozil 600 Mg  Tabs (Gemfibrozil) .... Take 1 Tablet By Mouth Once A Day 5)  Calcium 600 + D 600-400 Mg-Unit  Tabs (Calcium Carbonate-Vitamin D) .Marland Kitchen.. 1 Tab Twice Daily 6)  Multivitamins   Tabs (Multiple Vitamin) .... Take 1 Tablet By Mouth Once A Day 7)  Vitamin D 1000 Unit Tabs (Cholecalciferol) .... Take 1 Cap Daily.Marland KitchenMarland Kitchen 8)  Alprazolam  0.25 Mg  Tabs (Alprazolam) .Marland Kitchen.. 1 Tab By Mouth Three Times A Day As Needed For Anxiety 9)  Tylenol Pm Extra Strength 500-25 Mg  Tabs (Diphenhydramine-Apap (Sleep)) .... As Needed 10)  Avalide 150-12.5 Mg Tabs (Irbesartan-Hydrochlorothiazide) .... Take 1 Tablet By Mouth Once A Day  Allergies: 1)  ! Codeine 2)  ! Amoxicillin 3)  ! Fosamax (Alendronate Sodium) 4)  ! Diovan  Past History:  Family History: Last updated: 12-06-2009 mother died age 41 from cancer father died age 33 from  lung problems-neg for heart dz.  1 sibling died age 58 from copd  Social History: Last updated: 07/27/2009 Married, husb= [name],  37yrs  1 Child- son [name], from prev marriage 1 Step-daughter Never smoked No alcohol Retired Research scientist (physical sciences)  Risk Factors: Smoking Status: never (07/26/2008)  Past Medical History: [place] - she sees Holiday representative for [place] are up-to-date... BMD here 1/08 & 12/09 as noted... colonoscopy 2/04 by DrPatterson w/ rec for f/u in 10 years... she's had her 2010 Flu shot this season, had Pneumovax 5 yrs ago at age 30... OK Tdap today.  Hx of Perforated TM 1999 (w/ a paper clip!) - eval by DrKraus & DrDeaton...  ~  exam 12/09 showed cerumen impactions and she will f/u w/ ENT for removal...  HYPERTENSION (ICD-401.9) - controlled on ASA 81mg /d, TOPROL XL 50mg /d,  --allergic rx to [place] 10/2009, stopped ARBs changed to   HYPERLIPIDEMIA (ICD-272.4) - controlled on LIPITOR 20mg /d & GEMFIB 600mg /d... takes these regularly & no side effects...   ~  [place] 12/07 showed TChol 193, TG 160, HDL 52, LDL 109  ~  FLP 12/08 showed TChol 177, TG 171, HDL 50, LDL 92... continue same meds.  ~  [place] 12/09 showed TChol 193, TG 149, HDL 55, LDL 108  ~  FLP 12/10 showed TChol 187, TG 190, HDL 55, LDL 94  IRRITABLE BOWEL SYNDROME (ICD-564.1) - last colon 2/04 by DrPatterson was normal (f/u rec for 39yrs)... hx of hemorroids and constipation in the past... denies nausea, vomiting,  heartburn, diarrhea, constipation, blood in stool, abdominal pain, swelling, gas...  ~  For Constipation-  rec taking Senakot-S & Miralax daily...  LOW BACK PAIN (ICD-724.2) - she has hx of HNP, manages OK w/ exercise and Tylenol. OSTEOPENIA (ICD-733.90) - she stopped Fosamax due to side effects but tolerated BONIVA (on hold now w/ improved BMD)... also takes ca++, MVI, Vit D 1000 daily.  ~  BMD 1/08 showed TScores -0.6 in spine, and -1.4 in fem neck...  ~  BMD 12/09 showed TScores +1.0 in Spine & -0.8 in left FemNeck... rec> stop Boniva.  ~  12/10:  no bone symptoms- on drug holiday at present...  ANXIETY (ICD-300.00) - uses ALPRAZOLAM 0.25mg  Prn...     Family History:  mother died age 32 from cancer father died age 13 from  lung problems-neg for heart dz.  1 sibling died age 29 from copd  Review of Systems      See HPI  Vital Signs:  Patient profile:   77 year old female Height:      61 inches Weight:      134.38 pounds BMI:     25.48 O2 Sat:      95 % on Room air Temp:     97.2 degrees F oral Pulse rate:   69 / minute BP sitting:   134 / 78  (left arm) Cuff size:   regular  Vitals Entered By: Parke Poisson CNA (November 09, 2009 10:59 AM)  O2 Flow:  Room air CC: pt was walking Monday, states when she was about 1 block from home she became dizzy and almost fainted; went immediately home, became nauseas/perspired.  pt states she has changed BP meds several times this past month from avalide 300/12.5 (which was dc'd), to diovan 320/12.5 (allergic reaction) to avalide 150/12.5 which she has been on x2weeks Is Patient Diabetic? No Comments Medications reviewed with patient Daytime contact number verified with patient. Parke Poisson CNA  November 09, 2009 11:00 AM    Physical Exam  Additional Exam:  WD, WN, [name] in NAD... GENERAL:  Alert & oriented; pleasant & cooperative... HEENT:  [place]/AT, EOM-wnl, PERRLA, Fundi-benign, EACs-clear, TMs-wnl x scarring on L, NOSE-clear,  THROAT-clear & wnl. NECK:  Supple w/ fairROM; no JVD; normal carotid impulses w/o bruits; no thyromegaly or nodules palpated; no lymphadenopathy. CHEST:  Clear to P & A; without wheezes/ rales/ or rhonchi. HEART:  Regular Rhythm; without murmurs/ rubs/ or gallops. ABDOMEN:  Soft & nontender; normal bowel sounds; no organomegaly or masses detected. EXT: without deformities or arthritic changes; no varicose veins/ venous insuffic/ or edema. NEURO:  CN's intact; motor testing normal; sensory testing normal; gait normal & balance OK. DERM:  No lesions noted; no rash etc...     Impression & Recommendations:  Problem # 1:  HYPERTENSION (ICD-401.9)  ?reaction to Diovan w/ gum swelling. Will not be able to continue Avalide-would be worried that this was angioedema which is typically a class effect and has the potential to be quite severe.  REC:  Begin Norvasc 5mg  once daily  Begin HCTZ 12.5mg  once daily  Keep b/p log once daily and bring to next ov.  follow up Dr. Lenna Gilford in 4 weeks     Her updated medication list for this problem includes:    Metoprolol Succinate 50 Mg Tb24 (Metoprolol succinate) .Marland Kitchen... Take 1 tab by mouth once daily...    Amlodipine Besylate 5 Mg Tabs (Amlodipine besylate) .Marland Kitchen... 1 by mouth once daily    Hydrochlorothiazide 12.5 Mg Tabs (Hydrochlorothiazide) .Marland Kitchen... 1 by mouth once daily  Orders: Cardiology Referral (Cardiology) TLB-BMP (Basic Metabolic Panel-BMET) (99991111) TLB-CBC Platelet - w/Differential (85025-CBCD) Est. Patient Level IV [id number])  Problem # 2:  WEAKNESS (ICD-780.79)  ?episode during waliking w/ presyncope like symptoms. she did not experience chest pain. EKG is unremarkable today She does have few risk factors w/ HTN/Hyperlipidemia. , age Will refer to card for eval of possible underlying heart dz.   Orders: Est. Patient Level IV [id number]) Prescription Created Electronically [phone])  Medications Added to Medication List This Visit: 1)   Amlodipine Besylate 5 Mg Tabs (Amlodipine besylate) .Marland Kitchen.. 1 by mouth once daily 2)  Hydrochlorothiazide 12.5 Mg Tabs (Hydrochlorothiazide) .Marland Kitchen.. 1 by mouth once daily  Complete Medication List: 1)  Adult Aspirin Low Strength 81 Mg Tbdp (Aspirin) .... Take 1 tablet by mouth once a day 2)  Metoprolol Succinate 50 Mg Tb24 (Metoprolol succinate) .... Take 1 tab by mouth once daily.Marland KitchenMarland Kitchen 3)  Lipitor 20 Mg Tabs (Atorvastatin calcium) .... Take 1 tablet by mouth once a day 4)  Gemfibrozil 600 Mg Tabs (Gemfibrozil) .... Take 1 tablet by mouth once a day 5)  Calcium 600 + D 600-400 Mg-unit Tabs (Calcium carbonate-vitamin d) .Marland Kitchen.. 1 tab twice daily 6)  Multivitamins Tabs (Multiple vitamin) .... Take 1 tablet by mouth once a day 7)  Vitamin D 1000 Unit Tabs (Cholecalciferol) .... Take 1 cap daily.Marland KitchenMarland Kitchen 8)  Alprazolam 0.25 Mg Tabs (Alprazolam) .Marland Kitchen.. 1 tab by mouth three times a day as needed for anxiety 9)  Tylenol Pm Extra Strength 500-25 Mg Tabs (Diphenhydramine-apap (sleep)) .... As needed 10)  Amlodipine Besylate 5 Mg Tabs (Amlodipine besylate) .Marland Kitchen.. 1 by mouth once daily 11)  Hydrochlorothiazide 12.5 Mg Tabs (Hydrochlorothiazide) .Marland Kitchen.. 1 by mouth once daily  Patient Instructions: 1)  Begin Norvasc 5mg  once daily  2)  Begin HCTZ 12.5mg  once daily  3)  Keep b/p log once daily and bring to next ov.  4)  follow up Dr. Lenna Gilford in 4 weeks 5)  We are referring you to cardiology for evaluation of heart.  6)  Please contact office for sooner follow up if symptoms do not improve or worsen  7)  Do no skip meals, increase fluds 8)  No exercise until cardiac evaluation.  Prescriptions: HYDROCHLOROTHIAZIDE 12.5 MG TABS (HYDROCHLOROTHIAZIDE) 1 by mouth once daily  #30 x 5   Entered and Authorized by:   Rexene Edison NP   Signed by:   Rexene Edison NP on 11/09/2009   Method used:   Electronically to        [facility].* (retail)       [place],  [place]  [address]       Ph: [phone]       Fax: [phone]   RxID:   [id number] AMLODIPINE BESYLATE 5 MG TABS (AMLODIPINE BESYLATE) 1 by mouth once daily  #30 x 5   Entered and Authorized by:   Rexene Edison NP   Signed by:   Rexene Edison NP on 11/09/2009   Method used:   Electronically to        [facility].* (retail)       [address]       [place], [place]  [address]       Ph: [phone]       Fax: [phone]   RxID:   [id number]      [facility] ECG  ID: [id number] Patient: Emily Cannon, Emily Cannon DOB: 07-23-1933 Age: 77 Years Old Sex: Female Race: White Physician: Rexene Edison PCP: Teressa Lower Technician: Parke Poisson CNA Height: 61 Weight: 134.38 Status: Unconfirmed Past Medical History:  ALLERGIC RHINITIS (ICD-477.9) HYPERTENSION (ICD-401.9) HYPERLIPIDEMIA (ICD-272.4) IRRITABLE BOWEL SYNDROME (ICD-564.1) HYSTERECTOMY, HX OF (ICD-V45.77) LOW BACK PAIN (ICD-724.2) OSTEOPENIA (ICD-733.90) ANXIETY (ICD-300.00) Hx of TYMPANIC MEMBRANE PERFORATION, LEFT EAR (ICD-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

## 2010-08-28 NOTE — Progress Notes (Signed)
Summary: needs ov 1 month  Phone Note Call from Patient Call back at [facility] Phone [phone]   Caller: Patient Call For: nadel Summary of Call: pt was seen by tp today. needs rov in 1 month. please advise and i will call pt w/ appt date/ time. thanks.  Initial call taken by: Cooper Render, CNA,  November 09, 2009 12:07 PM  Follow-up for Phone Call        ok to add pt on for 5-20  at 11:30.  called and spoke with pt and she is aware of appt made [place]  November 09, 2009 2:20 PM

## 2010-08-28 NOTE — Assessment & Plan Note (Signed)
Summary: 5 month return/mh   Primary Care Provider:  Teressa Lower, MD  CC:  5 month ROV & review of mult medical problems....  History of Present Illness: [name] here for a follow up visit... she has multiple medical problems as noted below...     ~  Jan08:  she had BMD that showed osteopenia in the femoral neck w/ TScore -1.4 (it was -1.1 in 2005)... started on FOSAMAX but stopped it due to "SOB- I have a low tolerance to meds"... ?SOB improved off the Fosamax & she was switched to Boniva- tol well so far.  ~  Dec09:  doing satis- dieting, exercising, etc... no new complaints or concerns... wonders about coming off the bisphos therapy for a drug holiday... repeat BMD 12/09 showed improvement & normal TScores therefore Boniva stopped... Vit D was low at 19 and 50000 u weekly started along w/ her Calcium & MVI...   ~  Jun10:  30mo follow up doing well- had Mammogram that was neg, & BMD here 12/09 that showed normal TScores therefore Boniva stopped... she's been on Vit D 50K weekly & f/u Vit D today = 45 therefore switch to 1000 u OTC daily...  ~  Dec10:  doing well on meds w/o new complaints or concerns x sinus drainage & we discussed this & appropriate OTC Rx... needs refill of all meds for 30d supplies... she's had the 2010 Flu vaccine already & Pneumovax in past... OK Tdap today.   ~  Dec 15, 2009:  insurance co required change Avalide to Diovan but she developed "lip swelling" so ARBs stopped in favor of Amlodipine + HCT... she saw Benay Spice 4/11 for presyncopal episode (occured during walk in 80 defree heat)> all symptoms had resolved and EKG= NSR, WNL; & 2DEcho was neg... no ch in meds, feeling better & back to baseline... we reviewed her prev labs etc.   ~  May 10, 2010:  she's had a good 32mo- no new complaints or concerns... notes decr hearing & has had eval DrKraus w/ periodic ear cleaning required... BP controlled on meds;  Chol looks OK on Lip20 & [id number];  GI is stable;  and Ortho at  baseline...  she had the Flu shot already...   Current Problem List:  HEALTH MAINTENANCE - she sees [place] for GYN- PAP & Mammogram are up-to-date... BMD here 1/08 & 12/09 as noted... colonoscopy 2/04 by DrPatterson w/ rec for f/u in 10 years... she's had her 2011 Flu shot this season, had Pneumovax 6 yrs ago at age 56... received TDAP 5/11, and had the Shingles vaccine 6/11...  Hx of Perforated TM 1999 (w/ a paper clip!) - eval by DrKraus & DrDeaton...  ~  exam 12/09 showed cerumen impactions and she had this disimpacted by ENT.  ~  she continues to follow up w/ DrKraus periodically for cerumen problems & hearing tests.  HYPERTENSION (ICD-401.9) - controlled on ASA 81mg /d, TOPROL XL 50mg /d, AMLODIPINE 5mg /d, & HCT 12.5mg /d... tolerates well, and takes them regularly... BP 140/80 today & at home BP's are "all good"... denies HA, fatigue, visual changes, CP, palipit, dizziness, syncope, dyspnea, edema, etc...  ~  4/11: Cardiac eval DrKatz for presyncopal spell> EKG= NSR, WNL; 2DEcho= norm LV w/ EF= 65%, norm valves, etc...  HYPERLIPIDEMIA (ICD-272.4) - controlled on LIPITOR 20mg /d & GEMFIB 600mg /d... takes these regularly & no side effects...   ~  [place] 12/07 showed TChol 193, TG 160, HDL 52, LDL 109  ~  FLP 12/08 showed TChol  177, TG 171, HDL 50, LDL 92... continue same meds.  ~  [place] 12/09 showed TChol 193, TG 149, HDL 55, LDL 108  ~  FLP 12/10 showed TChol 187, TG 190, HDL 55, LDL 94  ~  FLP 10/11 showed TChol 187, TG 217, HDL 50, LDL 107... rec> better low fat diet, same meds.  IRRITABLE BOWEL SYNDROME (ICD-564.1) - last colon 2/04 by DrPatterson was normal (f/u rec for 68yrs)... hx of hemorroids and constipation in the past (rec to take Miralax & Senakot-S)... denies nausea, vomiting, heartburn, diarrhea, constipation, blood in stool, abdominal pain, swelling, gas...  LOW BACK PAIN (ICD-724.2) - she has hx of HNP, manages OK w/ exercise and Tylenol. OSTEOPENIA (ICD-733.90) - she stopped  Fosamax due to side effects but tolerated BONIVA (on hold now w/ improved BMD)... also takes ca++, MVI, Vit D 1000 daily.  ~  BMD 1/08 showed TScores -0.6 in spine, and -1.4 in fem neck...  ~  BMD 12/09 showed TScores +1.0 in Spine & -0.8 in left FemNeck... rec> stop Boniva.  ~  12/10:  no bone symptoms- on drug holiday at present...  ~  5/11: remains asymptomatic, doing well & exercises by walking.  ~  labs 10/11 showed Vit D level = 56  ANXIETY (ICD-300.00) - uses ALPRAZOLAM 0.25mg  Prn...   Preventive Screening-Counseling & Management  Alcohol-Tobacco     Smoking Status: never  Allergies: 1)  ! Codeine 2)  ! Amoxicillin 3)  ! Fosamax (Alendronate Sodium) 4)  ! Diovan  Comments:  Nurse/Medical Assistant: The patient's medications and allergies were reviewed with the patient and were updated in the Medication and Allergy Lists.  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 HYPERLIPIDEMIA (ICD-272.4) IRRITABLE BOWEL SYNDROME (ICD-564.1) HYSTERECTOMY, HX OF (ICD-V45.77) LOW BACK PAIN (ICD-724.2) OSTEOPENIA (ICD-733.90) ANXIETY (ICD-300.00)  Past Surgical History: Hysterectomy  Family History: Reviewed history from 11/09/2009 and no changes required. mother died age 62 from cancer father died age 95 from  lung problems-neg for heart dz.  1 sibling died age 41 from [place] History: Reviewed history from 07/27/2009 and no changes required. Married, husb= [place],  30yrs  1 Child- son [name], from prev marriage 1 Step-daughter Never smoked No alcohol Retired Research scientist (physical sciences)  Review of Systems      See HPI  The patient denies anorexia, fever, weight loss, weight gain, vision loss, decreased hearing, hoarseness, chest pain, syncope, dyspnea on exertion, peripheral edema,  prolonged cough, headaches, hemoptysis, abdominal pain, melena, hematochezia, severe indigestion/heartburn, hematuria, incontinence, muscle weakness, suspicious skin lesions, transient blindness, difficulty walking, depression, unusual weight change, abnormal bleeding, enlarged lymph nodes, and angioedema.    Vital Signs:  Patient profile:   77 year old female Height:      61 inches Weight:      133.13 pounds BMI:     25.25 O2 Sat:      97 % on Room air Temp:     97.9 degrees F oral Pulse rate:   54 / minute BP sitting:   140 / 80  (left arm) Cuff size:   regular  Vitals Entered By: Elita Boone CMA (May 10, 2010 9:59 AM)  O2 Sat at Rest %:  97 O2 Flow:  Room air CC: 5 month ROV & review of mult medical problems... Is Patient Diabetic? No Pain Assessment Patient in pain? no  Comments no changes in meds today   Physical Exam  Additional Exam:  WD, WN, [name] in NAD... GENERAL:  Alert & oriented; pleasant & cooperative... HEENT:  [place]/AT, EOM-wnl, PERRLA, Fundi-benign, EACs- dry wax, TMs- not seen, NOSE-clear, THROAT-clear & wnl. NECK:  Supple w/ fairROM; no JVD; normal carotid impulses w/o bruits; no thyromegaly or nodules palpated; no lymphadenopathy. CHEST:  Clear to P & A; without wheezes/ rales/ or rhonchi. HEART:  Regular Rhythm; without murmurs/ rubs/ or gallops. ABDOMEN:  Soft & nontender; normal bowel sounds; no organomegaly or masses detected. EXT: without deformities or arthritic changes; no varicose veins/ venous insuffic/ or edema. NEURO:  CN's intact; motor testing normal; sensory testing normal; gait normal & balance OK. DERM:  No lesions noted; no rash etc...    MISC. Report  Procedure date:  05/02/2010  Findings:      Lipid Panel (LIPID)   Cholesterol               187 mg/dL                   0-200   Triglycerides        [H]  217.0 mg/dL                 0.0-149.0   HDL                       50.00 mg/dL                 >39.00  Cholesterol LDL  - Direct                             107.4 mg/dL  BMP (METABOL)   Sodium                    141 mEq/L                   135-145   Potassium            [H]  5.4 mEq/L                   3.5-5.1   Chloride                  105 mEq/L                   96-112   Carbon Dioxide            29 mEq/L                    19-32   Glucose                   97 mg/dL                    70-99   BUN                       15 mg/dL                    6-23   Creatinine                0.9 mg/dL                   0.4-1.2   Calcium  9.5 mg/dL                   8.4-10.5   GFR                       67.48 mL/min                >60  Hepatic/Liver Function Panel (HEPATIC)   Total Bilirubin           0.6 mg/dL                   0.3-1.2   Direct Bilirubin          0.1 mg/dL                   0.0-0.3   Alkaline Phosphatase      57 U/L                      39-117   AST                       19 U/L                      0-37   ALT                       18 U/L                      0-35   Total Protein             6.9 g/dL                    6.0-8.3   Albumin                   4.3 g/dL                    3.5-5.2  Comments:      CBC Platelet w/Diff (CBCD)   White Cell Count          4.9 K/uL                    4.5-10.5   Red Cell Count            4.32 Mil/uL                 3.87-5.11   Hemoglobin                13.5 g/dL                   12.0-15.0   Hematocrit                39.8 %                      36.0-46.0   MCV                       92.2 fl                     78.0-100.0   Platelet Count            260.0 K/uL                  150.0-400.0   Neutrophil %  48.5 %                      43.0-77.0   Lymphocyte %              40.1 %                      12.0-46.0   Monocyte %                8.9 %                       3.0-12.0   Eosinophils%              1.8 %                       0.0-5.0   Basophils %               0.7 %                       0.0-3.0  TSH (TSH)   FastTSH                    4.27 uIU/mL                 0.35-5.50  Vitamin D (25-Hydroxy) [id number])  Vitamin D (25-Hydroxy)                             56 ng/mL                    30-89    Impression & Recommendations:  Problem # 1:  Hx of TYMPANIC MEMBRANE PERFORATION, 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  Problem # 2:  HYPERTENSION (ICD-401.9) Controlled>  same meds. Her updated medication list for this problem includes:    Metoprolol Succinate 50 Mg Tb24 (Metoprolol succinate) .Marland Kitchen... Take 1 tab by mouth once daily...    Amlodipine Besylate 5 Mg Tabs (Amlodipine besylate) .Marland Kitchen... 1 by mouth once daily    Hydrochlorothiazide 12.5 Mg Tabs (Hydrochlorothiazide) .Marland Kitchen... 1 by mouth once daily  Problem # 3:  HYPERLIPIDEMIA (ICD-272.4) Stable on meds but needs better low fat diet... discussed option of incr Gemfib to Bid. Her updated medication list for this problem includes:    Lipitor 20 Mg Tabs (Atorvastatin calcium) .Marland Kitchen... Take 1 tablet by mouth once a day    Gemfibrozil 600 Mg Tabs (Gemfibrozil) .Marland Kitchen... Take 1 tablet by mouth once a day  Problem # 4:  IRRITABLE BOWEL SYNDROME (ICD-564.1) GI is stable...  Problem # 5:  LOW BACK PAIN (ICD-724.2) Ortho is stable>  continue exercises & walking daily... Her updated medication list for this problem includes:    Adult Aspirin Low Strength 81 Mg Tbdp (Aspirin) .Marland Kitchen... Take 1 tablet by mouth once a day  Problem # 6:  OTHER MEDICAL PROBLEMS AS NOTED>>>  Complete Medication List: 1)  Adult Aspirin Low Strength 81 Mg Tbdp (Aspirin) .... Take 1 tablet by mouth once a day 2)  Metoprolol Succinate 50 Mg Tb24 (Metoprolol succinate) .... Take 1 tab by mouth once daily.Marland KitchenMarland Kitchen 3)  Amlodipine Besylate 5 Mg Tabs (Amlodipine besylate) .Marland Kitchen.. 1 by mouth once daily 4)  Hydrochlorothiazide 12.5 Mg Tabs (Hydrochlorothiazide) .Marland Kitchen.. 1 by mouth once daily 5)  Lipitor  20 Mg Tabs (Atorvastatin calcium) .... Take 1 tablet by mouth once a day 6)   Gemfibrozil 600 Mg Tabs (Gemfibrozil) .... Take 1 tablet by mouth once a day 7)  Calcium 600 + D 600-400 Mg-unit Tabs (Calcium carbonate-vitamin d) .Marland Kitchen.. 1 tab twice daily 8)  Multivitamins Tabs (Multiple vitamin) .... Take 1 tablet by mouth once a day 9)  Vitamin D 1000 Unit Tabs (Cholecalciferol) .... Take 1 cap daily... 10)  Alprazolam 0.25 Mg Tabs (Alprazolam) .Marland Kitchen.. 1 tab by mouth three times a day as needed for anxiety 11)  Tylenol Pm Extra Strength 500-25 Mg Tabs (Diphenhydramine-apap (sleep)) .... As needed  Patient Instructions: 1)  Today we updated your med list- see below.... 2)  Continue your current meds the same... 3)  You are up to date on all your vaccinations.Marland KitchenMarland Kitchen 4)  Call for any problems.Marland KitchenMarland Kitchen 5)  Please schedule a follow-up appointment in 6 months. Prescriptions: ALPRAZOLAM 0.25 MG  TABS (ALPRAZOLAM) 1 tab by mouth three times a day as needed for anxiety  #90 x 6   Entered and Authorized by:   Noralee Space MD   Signed by:   Noralee Space MD on 05/10/2010   Method used:   Print then Give to Patient   RxID:   [id number] GEMFIBROZIL 600 MG  TABS (GEMFIBROZIL) Take 1 tablet by mouth once a day  #30 x 12   Entered and Authorized by:   Noralee Space MD   Signed by:   Noralee Space MD on 05/10/2010   Method used:   Print then Give to Patient   RxID:   [id number] LIPITOR 20 MG  TABS (ATORVASTATIN CALCIUM) Take 1 tablet by mouth once a day  #30 x 12   Entered and Authorized by:   Noralee Space MD   Signed by:   Noralee Space MD on 05/10/2010   Method used:   Print then Give to Patient   [id number] HYDROCHLOROTHIAZIDE 12.5 MG TABS (HYDROCHLOROTHIAZIDE) 1 by mouth once daily  #30 x 12   Entered and Authorized by:   Noralee Space MD   Signed by:   Noralee Space MD on 05/10/2010   Method used:   Print then Give to Patient   RxID:   [id number] AMLODIPINE BESYLATE 5 MG TABS (AMLODIPINE BESYLATE) 1 by mouth once daily  #30 x 12   Entered and Authorized  by:   Noralee Space MD   Signed by:   Noralee Space MD on 05/10/2010   Method used:   Print then Give to Patient   RxID:   [id number] METOPROLOL SUCCINATE 50 MG  TB24 (METOPROLOL SUCCINATE) take 1 tab by mouth once daily...  #30 x 12   Entered and Authorized by:   Noralee Space MD   Signed by:   Noralee Space MD on 05/10/2010   Method used:   Print then Give to Patient   RxID:   [id number]    Immunization History:  Influenza Immunization History:    Influenza:  historical (05/04/2010)

## 2010-08-28 NOTE — Miscellaneous (Signed)
Summary: Flu Vaccine / [facility]  Flu Vaccine / [facility]   Imported By: Rise Patience 05/17/2010 13:59:12  _____________________________________________________________________  External Attachment:    Type:   Image     Comment:   External Document

## 2010-08-28 NOTE — Assessment & Plan Note (Signed)
Summary: 1 month follow up/la   Primary Care Provider:  Teressa Lower, MD  CC:  5 month ROV & review of mult medical problems....  History of Present Illness: [name] here for a follow up visit... she has multiple medical problems as noted below...     ~  Jan08:  she had BMD that showed osteopenia in the femoral neck w/ TScore -1.4 (it was -1.1 in 2005)... started on FOSAMAX but stopped it due to "SOB- I have a low tolerance to meds"... ?SOB improved off the Fosamax & she was switched to Boniva- tol well so far.  ~  Dec09:  doing satis- dieting, exercising, etc... no new complaints or concerns... wonders about coming off the bisphos therapy for a drug holiday... repeat BMD 12/09 showed improvement & normal TScores therefore Boniva stopped... Vit D was low at 19 and 50000 u weekly started along w/ her Calcium & MVI...   ~  Jun10:  25mo follow up doing well- had Mammogram that was neg, & BMD here 12/09 that showed normal TScores therefore Boniva stopped... she's been on Vit D 50K weekly & f/u Vit D today = 45 therefore switch to 1000 u OTC daily...  ~  Dec10:  doing well on meds w/o new complaints or concerns x sinus drainage & we discussed this & appropriate OTC Rx... needs refill of all meds for 30d supplies... she's had the 2010 Flu vaccine already & Pneumovax in past... OK Tdap today.   ~  Dec 15, 2009:  insurance co required change Avalide to Diovan but she developed "lip swelling" so ATBs stopped in favor of Amlodipine + HCT... she saw Benay Spice 4/11 for presyncopal episode (occured during walk in 80 defree heat)> all symptoms had resolved and EKG= NSR, WNL; & 2DEcho was neg... no ch in meds, feeling better & back to baseline... we reviewed her prev labs etc.    Current Problem List:  HEALTH MAINTENANCE - she sees [facility] for GYN- PAP & Mammogram are up-to-date... BMD here 1/08 & 12/09 as noted... colonoscopy 2/04 by DrPatterson w/ rec for f/u in 10 years... she's had her 2010 Flu shot this  season, had Pneumovax 5 yrs ago at age 61... OK Tdap today.  Hx of Perforated TM 1999 (w/ a paper clip!) - eval by DrKraus & DrDeaton...  ~  exam 12/09 showed cerumen impactions and she had this disimpacted by ENT.  HYPERTENSION (ICD-401.9) - controlled on ASA 81mg /d, TOPROL XL 50mg /d, AMLODIPINE 5mg /d, & HCT 12.5mg /d... tolerates well, and takes them regularly... BP 140/80 today & at home BP's are "all good"... denies HA, fatigue, visual changes, CP, palipit, dizziness, syncope, dyspnea, edema, etc...  ~  4/11: Cardiac eval DrKatz for presyncopal spell> EKG= NSR, WNL; 2DEcho= norm LV w/ EF= 65%, norm valves, etc...  HYPERLIPIDEMIA (ICD-272.4) - controlled on LIPITOR 20mg /d & GEMFIB 600mg /d... takes these regularly & no side effects...   ~  [facility] 12/07 showed TChol 193, TG 160, HDL 52, LDL 109  ~  FLP 12/08 showed TChol 177, TG 171, HDL 50, LDL 92... continue same meds.  ~  [facility] 12/09 showed TChol 193, TG 149, HDL 55, LDL 108  ~  FLP 12/10 showed TChol 187, TG 190, HDL 55, LDL 94  IRRITABLE BOWEL SYNDROME (ICD-564.1) - last colon 2/04 by DrPatterson was normal (f/u rec for 68yrs)... hx of hemorroids and constipation in the past... denies nausea, vomiting, heartburn, diarrhea, constipation, blood in stool, abdominal pain, swelling, gas...  ~  For  Constipation-  rec taking Senakot-S & Miralax daily...  LOW BACK PAIN (ICD-724.2) - she has hx of HNP, manages OK w/ exercise and Tylenol. OSTEOPENIA (ICD-733.90) - she stopped Fosamax due to side effects but tolerated BONIVA (on hold now w/ improved BMD)... also takes ca++, MVI, Vit D 1000 daily.  ~  BMD 1/08 showed TScores -0.6 in spine, and -1.4 in fem neck...  ~  BMD 12/09 showed TScores +1.0 in Spine & -0.8 in left FemNeck... rec> stop Boniva.  ~  12/10:  no bone symptoms- on drug holiday at present...  ~  5/11: remains asymptomatic, doing well & exercises by walking.  ANXIETY (ICD-300.00) - uses ALPRAZOLAM 0.25mg  Prn...   Allergies: 1)  !  Codeine 2)  ! Amoxicillin 3)  ! Fosamax (Alendronate Sodium) 4)  ! Diovan  Comments:  Nurse/Medical Assistant: The patient's medications and allergies were reviewed with the patient and were updated in the Medication and Allergy Lists.  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 mother died age 52 from cancer father died age 66 from  lung problems-neg for heart dz.  1 sibling died age 67 from [place] History: Reviewed history from 07/27/2009 and no changes required. Married, husb= Colon,  40yrs  1 Child- son [name], from prev marriage 1 Step-daughter Never smoked No alcohol Retired Research scientist (physical sciences)  Review of Systems      See HPI  The patient denies anorexia, fever, weight loss, weight gain, vision loss, decreased hearing, hoarseness, chest pain, syncope, dyspnea on exertion, peripheral edema, prolonged cough, headaches, hemoptysis, abdominal pain, melena, hematochezia, severe indigestion/heartburn, hematuria, incontinence, muscle weakness, suspicious skin lesions, transient blindness, difficulty walking, depression, unusual weight change, abnormal bleeding, enlarged lymph nodes, and angioedema.    Vital Signs:  Patient profile:   77 year old female Height:      61 inches Weight:      132 pounds BMI:     25.03 O2 Sat:      96 % on Room air Temp:     99.4 degrees F oral Pulse rate:   56 / minute BP sitting:   140 / 80  (left arm) Cuff size:   regular  Vitals Entered By: Elita Boone CMA (Dec 15, 2009 11:19 AM)  O2 Sat at Rest %:  96 O2 Flow:  Room air CC: 5 month ROV &  review of mult medical problems... Is Patient Diabetic? No Pain Assessment Patient in pain? no      Comments no changes in meds today   Physical Exam  Additional Exam:  WD, WN, [name] in NAD... GENERAL:  Alert & oriented; pleasant & cooperative... HEENT:  [place]/AT, EOM-wnl, PERRLA, Fundi-benign, EACs-clear, TMs-wnl x scarring on L, NOSE-clear, THROAT-clear & wnl. NECK:  Supple w/ fairROM; no JVD; normal carotid impulses w/o bruits; no thyromegaly or nodules palpated; no lymphadenopathy. CHEST:  Clear to P & A; without wheezes/ rales/ or rhonchi. HEART:  Regular Rhythm; without murmurs/ rubs/ or gallops. ABDOMEN:  Soft & nontender; normal bowel sounds; no organomegaly or masses detected. EXT: without deformities or arthritic changes; no varicose veins/ venous insuffic/ or edema. NEURO:  CN's intact; motor testing normal; sensory testing normal; gait normal & balance OK. DERM:  No lesions noted; no rash etc...    MISC. Report  Procedure date:  12/15/2009  Findings:      DATA REVIEWED:   ~  Cardiology eval DrKatz4/21/11 w/ EKG, 2DEcho, etc...  ~  Prev lab data including BMDs & Vit D levels...   Impression & Recommendations:  Problem # 1:  * DIZZINESS / PRESYNCOPE  10/2009 Resolved... no recurrence, she is back to walking regularly etc...  Problem # 2:  HYPERTENSION (ICD-401.9) Controlled on meds>  meds adjusted as noted & doing satis on the new regimen... Her updated medication list for this problem includes:    Metoprolol Succinate 50 Mg Tb24 (Metoprolol succinate) .Marland Kitchen... Take 1 tab by mouth once daily...    Amlodipine Besylate 5 Mg Tabs (Amlodipine besylate) .Marland Kitchen... 1 by mouth once daily    Hydrochlorothiazide 12.5 Mg Tabs (Hydrochlorothiazide) .Marland Kitchen... 1 by mouth once daily  Problem # 3:  HYPERLIPIDEMIA (ICD-272.4) Stable on these meds>  continue same; discussed diet + exercise. Her updated medication list for this problem includes:    Lipitor 20 Mg Tabs (Atorvastatin  calcium) .Marland Kitchen... Take 1 tablet by mouth once a day    Gemfibrozil 600 Mg Tabs (Gemfibrozil) .Marland Kitchen... Take 1 tablet by mouth once a day  Problem # 4:  IRRITABLE BOWEL SYNDROME (ICD-564.1) Stable>  uses miralax/ senakot etc...  Problem # 5:  OSTEOPENIA (ICD-733.90) Stable, doing satis off the Bisphos (Boniva) drug holiday..  Problem # 6:  ANXIETY (ICD-300.00) Stable on the Alpraz Prn... Her updated medication list for this problem includes:    Alprazolam 0.25 Mg Tabs (Alprazolam) .Marland Kitchen... 1 tab by mouth three times a day as needed for anxiety  Problem # 7:  OTHER MEDICAL ISSUES AS NOTED>>> She & husb would like Rx for Shingles vaccine... Done.  Complete Medication List: 1)  Adult Aspirin Low Strength 81 Mg Tbdp (Aspirin) .... Take 1 tablet by mouth once a day 2)  Metoprolol Succinate 50 Mg Tb24 (Metoprolol succinate) .... Take 1 tab by mouth once daily.Marland KitchenMarland Kitchen 3)  Amlodipine Besylate 5 Mg Tabs (Amlodipine besylate) .Marland Kitchen.. 1 by mouth once daily 4)  Hydrochlorothiazide 12.5 Mg Tabs (Hydrochlorothiazide) .Marland Kitchen.. 1 by mouth once daily 5)  Lipitor 20 Mg Tabs (Atorvastatin calcium) .... Take 1 tablet by mouth once a day 6)  Gemfibrozil 600 Mg Tabs (Gemfibrozil) .... Take 1 tablet by mouth once a day 7)  Calcium 600 + D 600-400 Mg-unit Tabs (Calcium carbonate-vitamin d) .Marland Kitchen.. 1 tab twice daily 8)  Multivitamins Tabs (Multiple vitamin) .... Take 1 tablet by mouth once a day 9)  Vitamin D 1000 Unit Tabs (Cholecalciferol) .... Take 1 cap daily... 10)  Alprazolam 0.25 Mg Tabs (Alprazolam) .Marland Kitchen.. 1 tab by mouth three times a day as needed for anxiety 11)  Tylenol Pm Extra Strength 500-25 Mg Tabs (Diphenhydramine-apap (sleep)) .... As needed  Patient Instructions: 1)  Today we updated your med list- see below.... 2)  Continue your current meds the same... 3)  Call for any problems.Marland KitchenMarland Kitchen 4)  Let's plan a follow up visit in October, and you may call us the week before for your Fasting blood work... 5)  I wrote  perscriptions for the shingles vaccine for you and [name] to fill at your Pharm or the [facility] [facility].

## 2010-08-28 NOTE — Progress Notes (Signed)
Summary: retuning call  Phone Note Call from Patient Call back at Home Phone [phone]   Caller: Patient Call For: nadel Summary of Call: returning call leave her a msg/cb Initial call taken by: Don Broach,  September 11, 2009 8:33 AM  Follow-up for Phone Call        Marliss Czar, did you call this pt? Please advise thanks Tilden Dome  September 11, 2009 9:09 AM   i did call her about her avalide--this med is on backorder from the pharmacy and SN rec changing this to diovan/hct  320/12.5  1 tab daily.  just wanted to make sure that the pt was aware and ok with this.  thanks [place]  September 11, 2009 9:16 AM   Additional Follow-up for Phone Call Additional follow up Details #1::        Spoke with pt and made aware that SN wants to call in diovan hct in place of avalide due to med being on backorder.  Pt okay with this change and [facility]. Additional Follow-up by: Tilden Dome,  September 11, 2009 9:19 AM    New/Updated Medications: DIOVAN HCT 320-12.5 MG TABS (VALSARTAN-HYDROCHLOROTHIAZIDE) 1 by mouth once daily Prescriptions: DIOVAN HCT 320-12.5 MG TABS (VALSARTAN-HYDROCHLOROTHIAZIDE) 1 by mouth once daily  #30 x 5   Entered by:   Tilden Dome   Authorized by:   Noralee Space MD   Signed by:   Tilden Dome on 09/11/2009   Method used:   Electronically to        [facility]. [phone]* (retail)       [address] [place].       [place], [place]  [address]       Ph: [phone]       Fax: [phone]   RxID:   [id number]

## 2010-08-28 NOTE — Progress Notes (Signed)
Summary: Prescriptions  Phone Note Call from Patient Call back at Home Phone [phone]   Caller: Patient Call For: nadel Summary of Call: Had a prescription written and lost it, need a another to be called in.[phone] Initial call taken by: Netta Neat,  August 02, 2009 1:39 PM  Follow-up for Phone Call        will try back later---pt is not at home. Elita Boone CMA  August 02, 2009 2:25 PM   Pt is aware RX for her medications sent electronically today by Elita Boone to [facility] on [address]. I told her to check with the pharmacy to make sure they are ready before going to pick them up. Follow-up by: Francesca Jewett CMA,  August 02, 2009 4:19 PM

## 2010-08-28 NOTE — Progress Notes (Signed)
Summary: possible med change/ ins LMTCB x1  Phone Note Call from Patient   Caller: Patient Call For: Senai Ramnath Summary of Call: pt says that her ins co. sent her a letter stating that she "may" have to switch from avalide to avapro. pt says she does well on avalide and doesn't want to switch. pt's cell [phone] Initial call taken by: Cooper Render, CNA,  August 31, 2009 3:31 PM  Follow-up for Phone Call        called [phone], called [phone]-line busy.  Crystal Jones RN  August 31, 2009 4:03 PM  advised pt if she does not want  to switch meds then we can attempt to do a prior auth to have me approved. Advised she needed to call her pharmacy and have them fax [place] or call with PA info. Pt staets she will do so. Glen Jean Bing CMA  September 01, 2009 10:15 AM

## 2010-08-31 NOTE — Miscellaneous (Signed)
Summary: Psychologist, clinical Release   Imported By: Sallee Provencal 12/11/2009 12:56:53  _____________________________________________________________________  External Attachment:    Type:   Image     Comment:   External Document

## 2010-10-26 ENCOUNTER — Encounter: Payer: Self-pay | Admitting: Pulmonary Disease

## 2010-10-31 ENCOUNTER — Other Ambulatory Visit (INDEPENDENT_AMBULATORY_CARE_PROVIDER_SITE_OTHER): Payer: Medicare Other

## 2010-10-31 ENCOUNTER — Encounter: Payer: Self-pay | Admitting: Pulmonary Disease

## 2010-10-31 ENCOUNTER — Ambulatory Visit (INDEPENDENT_AMBULATORY_CARE_PROVIDER_SITE_OTHER): Payer: Medicare Other | Admitting: Pulmonary Disease

## 2010-10-31 ENCOUNTER — Ambulatory Visit (INDEPENDENT_AMBULATORY_CARE_PROVIDER_SITE_OTHER)
Admission: RE | Admit: 2010-10-31 | Discharge: 2010-10-31 | Disposition: A | Discharge status: Home / Self Care | Payer: Medicare Other | Source: Ambulatory Visit

## 2010-10-31 ENCOUNTER — Other Ambulatory Visit (INDEPENDENT_AMBULATORY_CARE_PROVIDER_SITE_OTHER): Payer: Medicare Other | Admitting: Pulmonary Disease

## 2010-10-31 DIAGNOSIS — I10 Essential (primary) hypertension: Secondary | ICD-10-CM

## 2010-10-31 DIAGNOSIS — M949 Disorder of cartilage, unspecified: Secondary | ICD-10-CM

## 2010-10-31 DIAGNOSIS — E785 Hyperlipidemia, unspecified: Secondary | ICD-10-CM

## 2010-10-31 DIAGNOSIS — K589 Irritable bowel syndrome without diarrhea: Secondary | ICD-10-CM

## 2010-10-31 DIAGNOSIS — M899 Disorder of bone, unspecified: Secondary | ICD-10-CM

## 2010-10-31 DIAGNOSIS — F411 Generalized anxiety disorder: Secondary | ICD-10-CM

## 2010-10-31 LAB — HEPATIC FUNCTION PANEL
AST: 22 U/L (ref 0–37)
Alkaline Phosphatase: 61 U/L (ref 39–117)
Total Bilirubin: 0.4 mg/dL (ref 0.3–1.2)

## 2010-10-31 LAB — BASIC METABOLIC PANEL
BUN: 14 mg/dL (ref 6–23)
Calcium: 9.5 mg/dL (ref 8.4–10.5)
GFR: 78.77 mL/min (ref >60.00)
Potassium: 4.4 mEq/L (ref 3.5–5.1)
Sodium: 143 mEq/L (ref 135–145)

## 2010-10-31 LAB — LIPID PANEL
HDL: 52.8 mg/dL (ref >39.00)
Triglycerides: 207 mg/dL — ABNORMAL HIGH (ref 0.0–149.0)
VLDL: 41.4 mg/dL — ABNORMAL HIGH (ref 0.0–40.0)

## 2010-10-31 LAB — LDL CHOLESTEROL, DIRECT: Direct LDL: 101.9 mg/dL

## 2010-10-31 MED ORDER — AMLODIPINE BESYLATE 5 MG PO TABS: 5.0000 mg | ORAL_TABLET | Freq: Every day | ORAL | Status: DC

## 2010-10-31 MED ORDER — ATORVASTATIN CALCIUM 20 MG PO TABS: 20.0000 mg | ORAL_TABLET | Freq: Every day | ORAL | Status: DC

## 2010-10-31 MED ORDER — METOPROLOL SUCCINATE ER 50 MG PO TB24: 50.0000 mg | ORAL_TABLET | Freq: Every day | ORAL | Status: DC

## 2010-10-31 MED ORDER — ALPRAZOLAM 0.25 MG PO TABS: 0.2500 mg | ORAL_TABLET | Freq: Three times a day (TID) | ORAL | Status: DC | PRN

## 2010-10-31 MED ORDER — GEMFIBROZIL 600 MG PO TABS: 600.0000 mg | ORAL_TABLET | Freq: Every day | ORAL | Status: DC

## 2010-10-31 MED ORDER — HYDROCHLOROTHIAZIDE 12.5 MG PO CAPS: 12.5000 mg | ORAL_CAPSULE | Freq: Every day | ORAL | Status: DC

## 2010-10-31 NOTE — Patient Instructions (Addendum)
Today we updated your med list & sent refills for 2012 to your Pharm...  We also did your follow up FASTING blood work...    Please call the PHONE TREE in a few days for your results...    Dial [id number] & when prompted enter your pt number followed by the # symbol...    Your pt number =  [id number]  We will sched a follow up Bone Density test for you, and we will call you w/ this result when avail...  Keep up the good work w/ your diet & exercise program... Call for any questions... Let's plan another follow up appt in 6 months.Marland KitchenMarland Kitchen

## 2010-10-31 NOTE — Progress Notes (Signed)
Subjective:    Patient ID: JUHEE COOLEY, female    DOB: [birth year], 77 y.o.   MRN: [id number]  HPI [name] here for a follow up visit... she has multiple medical problems including:  HBP;  Hyperlipidemia;  IBS/ Hems;  LBP;  Osteopenia;  Anxiety...  ~  May 10, 2010:  she's had a good 53mo- no new complaints or concerns... notes decr hearing & has had eval DrKraus w/ periodic ear cleaning required... BP controlled on meds;  Chol looks OK on Lip20 & [id number];  GI is stable;  and Ortho at baseline...  she had the Flu shot already...  ~  October 31, 2010:  45mo ROV & doing well- notes some scattered aches & pains, known DJD for which she takes OTC meds prn...    HBP>  Controlled on Toprol, Norvasc, HCTZ;  BP= 132/84, tol meds well & feeling well;  Denies CP, palpit, dizzy, SOB, edema...    Lipids>  Treated w/ Lipitor & Lopid w/ prev FLPs reviewed & we ediscussed low fat diet + exercise...    IBS>  Last colonoscopy was 2004 (neg);  She denies recent symptoms of abd pain, N V D C, or blood seen...    Osteop>  She has been off the Boniva ~31yrs and due for f/u BMD ==> pending;  Continue calcium, MVI, Vit D (level= 56 last fall)...    Anxiety>  On alprazolam prn...     HEALTH MAINTENANCE - she sees [facility] for GYN- PAP & Mammogram are up-to-date... BMD here 1/08 & 12/09 as noted... colonoscopy 2/04 by DrPatterson w/ rec for f/u in 10 years... she's had her 2011 Flu shot this season, had Pneumovax 6 yrs ago at age 41... received TDAP 5/11, and had the Shingles vaccine 6/11...   Past Medical History  Diagnosis Date  . Allergic rhinitis, cause unspecified   . Perforation of tympanic membrane, unspecified   . Unspecified essential hypertension   . Hyperlipidemia   . Irritable bowel syndrome   . S/P Hysterectomy   . Lumbago   . Osteopenia   . Anxiety      Past Surgical History  Procedure Date  . Abdominal hysterectomy     Outpatient Encounter Prescriptions as of 10/31/2010  Medication Sig  Dispense Refill  . ALPRAZolam (XANAX) 0.25 MG tablet Take 0.25 mg by mouth 3 (three) times daily as needed.        Marland Kitchen amLODipine (NORVASC) 5 MG tablet Take 5 mg by mouth daily.        Marland Kitchen aspirin 81 MG tablet Take 81 mg by mouth daily.        Marland Kitchen atorvastatin (LIPITOR) 20 MG tablet Take 20 mg by mouth daily.        . Calcium Carbonate-Vitamin D (CALCIUM 600+D) 600-400 MG-UNIT per tablet Take 1 tablet by mouth 2 (two) times daily.        . Cholecalciferol (VITAMIN D) 1000 UNITS capsule Take 1,000 Units by mouth daily.        . diphenhydramine-acetaminophen (TYLENOL PM) 25-500 MG TABS Take 1 tablet by mouth at bedtime as needed.        Marland Kitchen gemfibrozil (LOPID) 600 MG tablet Take 600 mg by mouth daily.        . hydrochlorothiazide (,MICROZIDE/HYDRODIURIL,) 12.5 MG capsule Take 12.5 mg by mouth daily.        . metoprolol (TOPROL-XL) 50 MG 24 hr tablet Take 50 mg by mouth daily.        Marland Kitchen  Multiple Vitamin (MULTIVITAMIN) tablet Take 1 tablet by mouth daily.          Allergies  Allergen Reactions  . Alendronate Sodium     REACTION: pt states "short of breath"  . Amoxicillin     REACTION: causes severe diarrhea  . Codeine     REACTION: pt states nausea  . Valsartan     REACTION: gums/throat swelling    Review of Systems        See HPI - all other systems neg except as noted...  The patient denies anorexia, fever, weight loss, weight gain, vision loss, decreased hearing, hoarseness, chest pain, syncope, dyspnea on exertion, peripheral edema, prolonged cough, headaches, hemoptysis, abdominal pain, melena, hematochezia, severe indigestion/heartburn, hematuria, incontinence, muscle weakness, suspicious skin lesions, transient blindness, difficulty walking, depression, unusual weight change, abnormal bleeding, enlarged lymph nodes, and angioedema.     Objective:   Physical Exam     WD, WN, [name] in NAD... GENERAL:  Alert & oriented; pleasant & cooperative... HEENT:  [place]/AT, EOM-wnl, PERRLA,  Fundi-benign, EACs- dry wax, TMs- not seen, NOSE-clear, THROAT-clear & wnl. NECK:  Supple w/ fairROM; no JVD; normal carotid impulses w/o bruits; no thyromegaly or nodules palpated; no lymphadenopathy. CHEST:  Clear to P & A; without wheezes/ rales/ or rhonchi. HEART:  Regular Rhythm; without murmurs/ rubs/ or gallops. ABDOMEN:  Soft & nontender; normal bowel sounds; no organomegaly or masses detected. EXT: without deformities or arthritic changes; no varicose veins/ venous insuffic/ or edema. NEURO:  CN's intact; motor testing normal; sensory testing normal; gait normal & balance OK. DERM:  No lesions noted; no rash etc...   Assessment & Plan:

## 2010-11-04 ENCOUNTER — Encounter: Payer: Self-pay | Admitting: Pulmonary Disease

## 2010-11-04 NOTE — Assessment & Plan Note (Signed)
She remains stable on Liptor, Gemfib, & low fat diet.Marland KitchenMarland Kitchen

## 2010-11-04 NOTE — Assessment & Plan Note (Signed)
BP controlled on her 69med regimen>  Keep same.

## 2010-11-04 NOTE — Assessment & Plan Note (Signed)
Stable on Alprazolam, meds refilled.Marland KitchenMarland Kitchen

## 2010-11-04 NOTE — Assessment & Plan Note (Signed)
Hx IBS but stable, no recent symptoms.Emily KitchenMarland Cannon

## 2010-11-04 NOTE — Assessment & Plan Note (Signed)
It is time for a f/u BMD> off Boniva 48yrs, & we will arrange for the bone Density test..Marland Kitchen

## 2010-11-30 ENCOUNTER — Telehealth: Payer: Self-pay | Admitting: Pulmonary Disease

## 2010-11-30 NOTE — Telephone Encounter (Signed)
Please advise results of DEXA, thanks!

## 2010-12-03 ENCOUNTER — Encounter: Payer: Self-pay | Admitting: Pulmonary Disease

## 2010-12-05 NOTE — Telephone Encounter (Signed)
Called and spoke with pt about her BMD results per SN---BMD is normal off the boniva, so recs to cont calcium, MVI, vit d and stay off the boniva.  Pt voiced her understanding of this.

## 2010-12-11 NOTE — Assessment & Plan Note (Signed)
[facility]                             PULMONARY OFFICE NOTE   Emily Cannon, Emily Cannon                       MRN:          [id number]  DATE:04/29/2007                            DOB:          Dec 31, 1932    HISTORY OF PRESENT ILLNESS:  Patient is a 77 year old white female  patient of Dr. Jeannine Kitten who has a known history of hypertension,  hyperlipidemia, presents for an acute office visit. Patient complains  over the last week she has had urinary pressure, dysuria, and urinary  frequency, and urgency. Patient denies any fevers, back pain, abdominal  pain, nausea, vomiting, or hematuria.   PAST MEDICAL HISTORY:  Reviewed.   CURRENT MEDICATIONS:  Reviewed.   PHYSICAL EXAMINATION:  Patient is a pleasant female in no acute  distress. She afebrile with stable vital signs. Her O2 saturation is 97%  on room air.  HEENT: Unremarkable.  LUNGS: Clear.  CARDIAC: Regular rate.  ABDOMEN: Soft, nontender. No guarding or rebound.  Negative CVA tenderness.   DATA:  Urinalysis reveals small blood, large leukocytes, and 1 +  bacteria, and too numerous to count WBCs.   IMPRESSION AND PLAN:  Acute cystitis. Patient to begin Cipro 250 b.i.d.  times 7 days. Pyridium 100 mg up to 3 times a day as needed for bladder  spasms. Patient to increase fluid intake. Advised on urinary hygiene  measures. Patient is to follow back up with Dr. Lenna Gilford as scheduled or  sooner if needed.      Rexene Edison, NP  Electronically Signed      Deborra Medina. Lenna Gilford, MD  Electronically Signed   TP/MedQ  DD: 04/29/2007  DT: 04/29/2007  Job #: [id number]

## 2010-12-14 NOTE — Assessment & Plan Note (Signed)
[facility]                               PULMONARY OFFICE NOTE   Emily Cannon, Emily Cannon                       MRN:          [id number]  DATE:03/27/2006                            DOB:          Oct 22, 1932    HISTORY OF PRESENT ILLNESS:  The patient is a 77 year old white female  patient of Dr. Lenna Gilford who presents with a 5-day history of nasal congestion,  productive cough, and sore throat.  Patient denies any recent travel,  antibiotic use, chest pain, shortness of breath.   PAST MEDICAL HISTORY:  Reviewed.   CURRENT MEDICATIONS:  Reviewed.   PHYSICAL EXAMINATION:  GENERAL:  Patient is a pleasant female in no acute  distress.  VITAL SIGNS:  She is afebrile with stable vital signs.  O2 saturation is 99%  on room air.  HEENT:  Posterior pharynx has some erythema, no exudate noted.  TMs are  normal.  Nasal mucosa moist.  NECK:  Supple without adenopathy.  LUNGS:  Lung sounds are clear.  CARDIAC:  Regular rate and rhythm.  ABDOMEN:  Soft, benign.  EXTREMITIES:  Warm without any edema.   DATA:  Strep test is negative.   IMPRESSION AND PLAN:  Acute upper respiratory infection.  Patient was given  a Z-Pak.  Mucinex DM  as needed for cough.  Patient is to return as needed  with Dr. Lenna Gilford or sooner p.r.n.                                   Emily Edison, NP                                Emily Cannon. Lenna Gilford, MD   TP/MedQ  DD:  03/27/2006  DT:  03/28/2006  Job #:  [id number]

## 2011-04-17 ENCOUNTER — Telehealth: Payer: Self-pay | Admitting: Pulmonary Disease

## 2011-04-17 DIAGNOSIS — M899 Disorder of bone, unspecified: Secondary | ICD-10-CM

## 2011-04-17 DIAGNOSIS — I10 Essential (primary) hypertension: Secondary | ICD-10-CM

## 2011-04-17 DIAGNOSIS — E785 Hyperlipidemia, unspecified: Secondary | ICD-10-CM

## 2011-04-17 DIAGNOSIS — R5381 Other malaise: Secondary | ICD-10-CM

## 2011-04-17 DIAGNOSIS — F411 Generalized anxiety disorder: Secondary | ICD-10-CM

## 2011-04-17 NOTE — Telephone Encounter (Signed)
Please advise if labs are needed prior to her 6 month rov, thanks!

## 2011-04-18 NOTE — Telephone Encounter (Signed)
Per SN--ok to have labs prior to ov in October.  Labs have been placed in the computer for the pt.  i called and lmom to make pt aware.

## 2011-05-08 ENCOUNTER — Ambulatory Visit: Payer: Medicare Other | Admitting: Pulmonary Disease

## 2011-05-14 ENCOUNTER — Other Ambulatory Visit (INDEPENDENT_AMBULATORY_CARE_PROVIDER_SITE_OTHER): Payer: Medicare Other

## 2011-05-14 ENCOUNTER — Other Ambulatory Visit: Payer: Self-pay | Admitting: Pulmonary Disease

## 2011-05-14 DIAGNOSIS — M899 Disorder of bone, unspecified: Secondary | ICD-10-CM

## 2011-05-14 DIAGNOSIS — I10 Essential (primary) hypertension: Secondary | ICD-10-CM

## 2011-05-14 DIAGNOSIS — F411 Generalized anxiety disorder: Secondary | ICD-10-CM

## 2011-05-14 DIAGNOSIS — R5383 Other fatigue: Secondary | ICD-10-CM

## 2011-05-14 DIAGNOSIS — E785 Hyperlipidemia, unspecified: Secondary | ICD-10-CM

## 2011-05-14 DIAGNOSIS — R5381 Other malaise: Secondary | ICD-10-CM

## 2011-05-14 DIAGNOSIS — M949 Disorder of cartilage, unspecified: Secondary | ICD-10-CM

## 2011-05-14 LAB — CBC WITH DIFFERENTIAL/PLATELET
Basophils Relative: 0.5 % (ref 0.0–3.0)
Eosinophils Relative: 1.8 % (ref 0.0–5.0)
Lymphocytes Relative: 39.6 % (ref 12.0–46.0)
MCV: 92.8 fl (ref 78.0–100.0)
Monocytes Absolute: 0.5 10*3/uL (ref 0.1–1.0)
Neutrophils Relative %: 49.6 % (ref 43.0–77.0)
Platelets: 249 10*3/uL (ref 150.0–400.0)
RBC: 4.34 Mil/uL (ref 3.87–5.11)
WBC: 5.5 10*3/uL (ref 4.5–10.5)

## 2011-05-14 LAB — LDL CHOLESTEROL, DIRECT: Direct LDL: 94.7 mg/dL

## 2011-05-14 LAB — BASIC METABOLIC PANEL
BUN: 12 mg/dL (ref 6–23)
CO2: 29 mEq/L (ref 19–32)
Chloride: 104 mEq/L (ref 96–112)
Creatinine, Ser: 0.7 mg/dL (ref 0.4–1.2)
Glucose, Bld: 91 mg/dL (ref 70–99)
Potassium: 3.5 mEq/L (ref 3.5–5.1)

## 2011-05-14 LAB — LIPID PANEL
Cholesterol: 187 mg/dL (ref 0–200)
VLDL: 48 mg/dL — ABNORMAL HIGH (ref 0.0–40.0)

## 2011-05-14 LAB — TSH: TSH: 5.74 u[IU]/mL — ABNORMAL HIGH (ref 0.35–5.50)

## 2011-05-14 LAB — HEPATIC FUNCTION PANEL
ALT: 16 U/L (ref 0–35)
AST: 20 U/L (ref 0–37)
Albumin: 4.4 g/dL (ref 3.5–5.2)
Total Bilirubin: 0.6 mg/dL (ref 0.3–1.2)
Total Protein: 7.4 g/dL (ref 6.0–8.3)

## 2011-05-15 LAB — VITAMIN D 25 HYDROXY (VIT D DEFICIENCY, FRACTURES): Vit D, 25-Hydroxy: 39 ng/mL (ref 30–89)

## 2011-05-22 ENCOUNTER — Encounter: Payer: Self-pay | Admitting: Pulmonary Disease

## 2011-05-22 ENCOUNTER — Ambulatory Visit (INDEPENDENT_AMBULATORY_CARE_PROVIDER_SITE_OTHER): Payer: Medicare Other | Admitting: Pulmonary Disease

## 2011-05-22 DIAGNOSIS — I10 Essential (primary) hypertension: Secondary | ICD-10-CM

## 2011-05-22 DIAGNOSIS — F411 Generalized anxiety disorder: Secondary | ICD-10-CM

## 2011-05-22 DIAGNOSIS — M949 Disorder of cartilage, unspecified: Secondary | ICD-10-CM

## 2011-05-22 DIAGNOSIS — K589 Irritable bowel syndrome without diarrhea: Secondary | ICD-10-CM

## 2011-05-22 DIAGNOSIS — M545 Low back pain: Secondary | ICD-10-CM

## 2011-05-22 DIAGNOSIS — E039 Hypothyroidism, unspecified: Secondary | ICD-10-CM

## 2011-05-22 DIAGNOSIS — M899 Disorder of bone, unspecified: Secondary | ICD-10-CM

## 2011-05-22 DIAGNOSIS — E785 Hyperlipidemia, unspecified: Secondary | ICD-10-CM

## 2011-05-22 MED ORDER — LEVOTHYROXINE SODIUM 50 MCG PO TABS: 50.0000 ug | ORAL_TABLET | Freq: Every day | ORAL | Status: DC

## 2011-05-22 NOTE — Progress Notes (Signed)
Subjective:    Patient ID: Emily Cannon, female    DOB: 1933-01-26, 77 y.o.   MRN: [id number]  HPI  [name] here for a follow up visit... she has multiple medical problems including:  HBP;  Hyperlipidemia;  IBS/ Hems;  LBP;  Osteopenia;  Anxiety...  ~  May 10, 2010:  she's had a good 55mo- no new complaints or concerns... notes decr hearing & has had eval DrKraus w/ periodic ear cleaning required... BP controlled on meds;  Chol looks OK on Lip20 & [id number];  GI is stable;  and Ortho at baseline...  she had the Flu shot already...  ~  October 31, 2010:  69mo ROV & doing well- notes some scattered aches & pains, known DJD for which she takes OTC meds prn; f/u BMD shows TScores WNL & we will leave her off the Bisphos Rx, continue calcium, MVI, Vit D...  ~  May 22, 2011:  15mo ROV & she continues to do well- no new complaints or concerns; recent labs reviewed w/ pt & TG are up at 240, and TSH is 5.74 (she agrees to try better diet & start Synthroid58mcg/d)...    HBP>  on ToprolXL50, Norvasc5, HCTZ12.5; BP= 130/80, tol meds well & feeling well;  Denies CP, palpit, dizzy, SOB, edema...    Lipids> Treated w/ Lipitor20 & Lopid600; FLP shows chol ok but TG=240 & prob needs to incr Lopid but she prefers diet & start Synthroid...    Hypothy> TSH has been drifting up and today= 5.74; she notes sl decr energy & she agrees to try Synthroid 75mcg/d...    IBS>  Last colonoscopy was 2004 (neg); she denies recent symptoms of abd pain, N V D C, or blood seen...    Osteop>  She has been off the Boniva ~18yrs and f/u BMD shows normal TScores -0.9 in left fem neck;  Continue calcium, MVI, & incr VitD to 2000u/d (VitD=39).    Anxiety>  On Alprazolam prn...          Problem List:  Hx of Perforated TM 1999 (w/ a paper clip!) - eval by DrKraus & DrDeaton... ~  exam 12/09 showed cerumen impactions and she had this disimpacted by ENT. ~  she continues to follow up w/ DrKraus periodically for cerumen problems &  hearing tests.  HYPERTENSION (ICD-401.9) - controlled on ASA 81mg /d, TOPROL XL 50mg /d, AMLODIPINE 5mg /d, & HCT 12.5mg /d... tolerates well, and takes them regularly; denies HA, fatigue, visual changes, CP, palipit, dizziness, syncope, dyspnea, edema, etc... ~  4/11: Cardiac eval DrKatz for presyncopal spell> EKG= NSR, WNL; 2DEcho= norm LV w/ EF= 65%, norm valves, etc...  HYPERLIPIDEMIA (ICD-272.4) - on LIPITOR 20mg /d & GEMFIBRIZOL 600mg /d; takes these regularly & no side effects...  ~  [place] 12/07 showed TChol 193, TG 160, HDL 52, LDL 109 ~  FLP 12/08 showed TChol 177, TG 171, HDL 50, LDL 92... continue same meds. ~  [place] 12/09 showed TChol 193, TG 149, HDL 55, LDL 108 ~  FLP 12/10 showed TChol 187, TG 190, HDL 55, LDL 94 ~  FLP 10/11 showed TChol 187, TG 217, HDL 50, LDL 107... rec> better low fat diet, same meds. ~  [place] 10/12 showed TChol 187, TG 240, HDL 52, LDL 95... May need incr Lopid but she prefers to try the Synthroid 1st.  IRRITABLE BOWEL SYNDROME (ICD-564.1) - last colon 2/04 by DrPatterson was normal (f/u rec for 76yrs)... hx of hemorroids and constipation in the past (rec to take  Miralax & Senakot-S)... denies nausea, vomiting, heartburn, diarrhea, constipation, blood in stool, abdominal pain, swelling, gas...  LOW BACK PAIN (ICD-724.2) - she has hx of HNP, manages OK w/ exercise and Tylenol. OSTEOPENIA (ICD-733.90) - she stopped Fosamax due to side effects but tolerated BONIVA (on hold now w/ improved BMD)... also takes ca++, MVI, Vit D 1000 daily. ~  BMD 1/08 showed TScores -0.6 in spine, and -1.4 in fem neck... ~  BMD 12/09 showed TScores +1.0 in Spine & -0.8 in left FemNeck... rec> stop Boniva. ~  12/10:  no bone symptoms- on drug holiday at present... ~  5/11: remains asymptomatic, doing well & exercises by walking. ~  labs 10/11 showed Vit D level = 56 ~  BMD 4/12 showed TScores +0.9 in Spine and -0.9 in left FemNeck... Continue off the Boniva. ~  Labs 10/12 showed Vit D level =  39... rec incr OTC supplement to 2000u daily.  ANXIETY (ICD-300.00) - uses ALPRAZOLAM 0.25mg  Prn...  HEALTH MAINTENANCE - she sees [employer] representative for GYN- PAP & Mammogram are up-to-date... BMD here 1/08 & 12/09 as noted... colonoscopy 2/04 by DrPatterson w/ rec for f/u in 10 years... she's had her 2011 Flu shot this season, had Pneumovax 6 yrs ago at age 73... received TDAP 5/11, and had the Shingles vaccine 6/11...   Past Surgical History  Procedure Date  . Abdominal hysterectomy     Outpatient Encounter Prescriptions as of 05/22/2011  Medication Sig Dispense Refill  . ALPRAZolam (XANAX) 0.25 MG tablet Take 1 tablet (0.25 mg total) by mouth 3 (three) times daily as needed.  90 tablet  5  . amLODipine (NORVASC) 5 MG tablet Take 1 tablet (5 mg total) by mouth daily.  30 tablet  11  . aspirin 81 MG tablet Take 81 mg by mouth daily.        Marland Kitchen atorvastatin (LIPITOR) 20 MG tablet Take 1 tablet (20 mg total) by mouth daily.  30 tablet  11  . Calcium Carbonate-Vitamin D (CALCIUM 600+D) 600-400 MG-UNIT per tablet Take 1 tablet by mouth 2 (two) times daily.        . Cholecalciferol (VITAMIN D) 1000 UNITS capsule Take 1,000 Units by mouth daily.        . diphenhydramine-acetaminophen (TYLENOL PM) 25-500 MG TABS Take 1 tablet by mouth at bedtime as needed.        Marland Kitchen gemfibrozil (LOPID) 600 MG tablet Take 1 tablet (600 mg total) by mouth daily.  30 tablet  11  . hydrochlorothiazide (,MICROZIDE/HYDRODIURIL,) 12.5 MG capsule Take 1 capsule (12.5 mg total) by mouth daily.  30 capsule  11  . metoprolol (TOPROL-XL) 50 MG 24 hr tablet Take 1 tablet (50 mg total) by mouth daily.  30 tablet  11  . Multiple Vitamin (MULTIVITAMIN) tablet Take 1 tablet by mouth daily.          Allergies  Allergen Reactions  . Alendronate Sodium     REACTION: pt states "short of breath"  . Amoxicillin     REACTION: causes severe diarrhea  . Codeine     REACTION: pt states nausea  . Valsartan     REACTION: gums/throat swelling     Current Medications, Allergies, Past Medical History, Past Surgical History, Family History, and Social History were reviewed in [employer] record.    Review of Systems        See HPI - all other systems neg except as noted...  The patient denies anorexia, fever, weight loss,  weight gain, vision loss, decreased hearing, hoarseness, chest pain, syncope, dyspnea on exertion, peripheral edema, prolonged cough, headaches, hemoptysis, abdominal pain, melena, hematochezia, severe indigestion/heartburn, hematuria, incontinence, muscle weakness, suspicious skin lesions, transient blindness, difficulty walking, depression, unusual weight change, abnormal bleeding, enlarged lymph nodes, and angioedema.     Objective:   Physical Exam     WD, WN, [name] in NAD... GENERAL:  Alert & oriented; pleasant & cooperative... HEENT:  [place]/AT, EOM-wnl, PERRLA, Fundi-benign, EACs- dry wax, TMs- not seen, NOSE-clear, THROAT-clear & wnl. NECK:  Supple w/ fairROM; no JVD; normal carotid impulses w/o bruits; no thyromegaly or nodules palpated; no lymphadenopathy. CHEST:  Clear to P & A; without wheezes/ rales/ or rhonchi. HEART:  Regular Rhythm; without murmurs/ rubs/ or gallops. ABDOMEN:  Soft & nontender; normal bowel sounds; no organomegaly or masses detected. EXT: without deformities or arthritic changes; no varicose veins/ venous insuffic/ or edema. NEURO:  CN's intact; motor testing normal; sensory testing normal; gait normal & balance OK. DERM:  No lesions noted; no rash etc...   Assessment & Plan:   HBP>  Controlled on med; continue same...     Lipids> Treated w/ [place];  With elev TGs we prob need to incr the Lopid but she wants to wait 7 try better diet & see what the synthroid does...     Hypothy> TSH has been drifting up and today= 5.74; she notes sl decr energy & she agrees to try Synthroid 79mcg/d...    IBS>  Last colonoscopy was 2004 (neg); she denies  recent symptoms of abd pain, N V D C, or blood seen; f/u due 2004...     Osteop>  She has been off the Boniva ~8yrs and f/u BMD shows normal TScores -0.9 in left fem neck;  Continue calcium, MVI, & incr VitD to 2000u/d (VitD=39).     Anxiety>  On Alprazolam prn.Marland KitchenMarland Kitchen

## 2011-05-22 NOTE — Patient Instructions (Signed)
Today we updated your med list in our EPIC system...    We decided to start a Thyroid medication= Synthroid (Levothyroxine) 79mcg one tab daily...  We reviewed your recent blood work & gave you a copy for your records...  Call for any questions...  Let's plan a follow up visit in 6 months.Marland KitchenMarland Kitchen

## 2011-06-01 ENCOUNTER — Encounter: Payer: Self-pay | Admitting: Pulmonary Disease

## 2011-06-10 ENCOUNTER — Other Ambulatory Visit: Payer: Self-pay | Admitting: *Deleted

## 2011-06-10 MED ORDER — ALPRAZOLAM 0.25 MG PO TABS: 0.2500 mg | ORAL_TABLET | Freq: Three times a day (TID) | ORAL | Status: DC | PRN

## 2011-06-10 NOTE — Telephone Encounter (Signed)
Received faxed refill request from Emily Cannon [phone] [place] for Alprazolam 0.25mg . Pt last seen 05/22/2011 last refill approval sent 10/31/2010. Refill approval called to pharmacy.

## 2011-10-11 ENCOUNTER — Other Ambulatory Visit: Payer: Self-pay | Admitting: *Deleted

## 2011-10-11 MED ORDER — METOPROLOL SUCCINATE ER 50 MG PO TB24: 50.0000 mg | ORAL_TABLET | Freq: Every day | ORAL | Status: DC

## 2011-10-11 MED ORDER — AMLODIPINE BESYLATE 5 MG PO TABS: 5.0000 mg | ORAL_TABLET | Freq: Every day | ORAL | Status: DC

## 2011-10-11 MED ORDER — ATORVASTATIN CALCIUM 20 MG PO TABS: 20.0000 mg | ORAL_TABLET | Freq: Every day | ORAL | Status: DC

## 2011-10-11 MED ORDER — HYDROCHLOROTHIAZIDE 12.5 MG PO CAPS: 12.5000 mg | ORAL_CAPSULE | Freq: Every day | ORAL | Status: DC

## 2011-11-21 ENCOUNTER — Other Ambulatory Visit (INDEPENDENT_AMBULATORY_CARE_PROVIDER_SITE_OTHER): Payer: Medicare Other

## 2011-11-21 ENCOUNTER — Ambulatory Visit (INDEPENDENT_AMBULATORY_CARE_PROVIDER_SITE_OTHER): Payer: Medicare Other | Admitting: Pulmonary Disease

## 2011-11-21 ENCOUNTER — Encounter: Payer: Self-pay | Admitting: Pulmonary Disease

## 2011-11-21 VITALS — BP 158/82 | HR 60 | Temp 96.9°F | Ht 61.0 in | Wt 133.8 lb

## 2011-11-21 DIAGNOSIS — I10 Essential (primary) hypertension: Secondary | ICD-10-CM

## 2011-11-21 DIAGNOSIS — M899 Disorder of bone, unspecified: Secondary | ICD-10-CM

## 2011-11-21 DIAGNOSIS — F411 Generalized anxiety disorder: Secondary | ICD-10-CM

## 2011-11-21 DIAGNOSIS — E039 Hypothyroidism, unspecified: Secondary | ICD-10-CM

## 2011-11-21 DIAGNOSIS — E785 Hyperlipidemia, unspecified: Secondary | ICD-10-CM

## 2011-11-21 DIAGNOSIS — K589 Irritable bowel syndrome without diarrhea: Secondary | ICD-10-CM

## 2011-11-21 DIAGNOSIS — M545 Low back pain: Secondary | ICD-10-CM

## 2011-11-21 DIAGNOSIS — M949 Disorder of cartilage, unspecified: Secondary | ICD-10-CM

## 2011-11-21 LAB — BASIC METABOLIC PANEL
BUN: 16 mg/dL (ref 6–23)
Creatinine, Ser: 0.7 mg/dL (ref 0.4–1.2)
GFR: 86.36 mL/min (ref >60.00)
Potassium: 3.9 mEq/L (ref 3.5–5.1)

## 2011-11-21 LAB — LIPID PANEL
Cholesterol: 193 mg/dL (ref 0–200)
Triglycerides: 221 mg/dL — ABNORMAL HIGH (ref 0.0–149.0)
VLDL: 44.2 mg/dL — ABNORMAL HIGH (ref 0.0–40.0)

## 2011-11-21 MED ORDER — AMLODIPINE BESYLATE 5 MG PO TABS: 5.0000 mg | ORAL_TABLET | Freq: Every day | ORAL | Status: DC

## 2011-11-21 MED ORDER — GEMFIBROZIL 600 MG PO TABS: 600.0000 mg | ORAL_TABLET | Freq: Every day | ORAL | Status: DC

## 2011-11-21 MED ORDER — HYDROCHLOROTHIAZIDE 12.5 MG PO CAPS: 12.5000 mg | ORAL_CAPSULE | Freq: Every day | ORAL | Status: DC

## 2011-11-21 MED ORDER — ATORVASTATIN CALCIUM 20 MG PO TABS: 20.0000 mg | ORAL_TABLET | Freq: Every day | ORAL | Status: DC

## 2011-11-21 MED ORDER — ALPRAZOLAM 0.25 MG PO TABS: 0.2500 mg | ORAL_TABLET | Freq: Three times a day (TID) | ORAL | Status: DC | PRN

## 2011-11-21 MED ORDER — LEVOTHYROXINE SODIUM 50 MCG PO TABS: 50.0000 ug | ORAL_TABLET | Freq: Every day | ORAL | Status: DC

## 2011-11-21 MED ORDER — METOPROLOL SUCCINATE ER 50 MG PO TB24: 50.0000 mg | ORAL_TABLET | Freq: Every day | ORAL | Status: DC

## 2011-11-21 NOTE — Patient Instructions (Signed)
Today we updated your med list in our EPIC system...    Continue your current medications the same...    We refilled your meds per request...  Today we did your follow up blood work...    We will call you w/ the results when avail...  Monitor your BP at home please & keep a log of the readings...    Let me know if your BP is sustained [id number] systolic...  Otherwise we will plan a follow up visit in 6 months.Marland KitchenMarland Kitchen

## 2011-11-21 NOTE — Progress Notes (Addendum)
Subjective:    Patient ID: Emily Cannon, female    DOB: 20-Jan-1933, 78 y.o.   MRN: [id number]  HPI 78 y/o WF here for a follow up visit... she has multiple medical problems including:  HBP;  Hyperlipidemia;  IBS/ Hems;  LBP;  Osteopenia;  Anxiety...  ~  May 10, 2010:  she's had a good 61mo- no new complaints or concerns... notes decr hearing & has had eval DrKraus w/ periodic ear cleaning required... BP controlled on meds;  Chol looks OK on Lip20 & [id number];  GI is stable;  and Ortho at baseline...  she had the Flu shot already...  ~  October 31, 2010:  69mo ROV & doing well- notes some scattered aches & pains, known DJD for which she takes OTC meds prn; f/u BMD shows TScores WNL & we will leave her off the Bisphos Rx, continue calcium, MVI, Vit D...  ~  May 22, 2011:  41mo ROV & she continues to do well- no new complaints or concerns; recent labs reviewed w/ pt & TG are up at 240, and TSH is 5.74 (she agrees to try better diet & start Synthroid17mcg/d)...    HBP>  on ToprolXL50, Norvasc5, HCTZ12.5; BP= 130/80, tol meds well & feeling well;  Denies CP, palpit, dizzy, SOB, edema...    Lipids> Treated w/ Lipitor20 & Lopid600; FLP shows chol ok but TG=240 & prob needs to incr Lopid but she prefers diet & start Synthroid...    Hypothy> TSH has been drifting up and today= 5.74; she notes sl decr energy & she agrees to try Synthroid 30mcg/d...    IBS>  Last colonoscopy was 2004 (neg); she denies recent symptoms of abd pain, N V D C, or blood seen...    Osteop>  She has been off the Boniva ~38yrs and f/u BMD shows normal TScores -0.9 in left fem neck;  Continue calcium, MVI, & incr VitD to 2000u/d (VitD=39).    Anxiety>  On Alprazolam prn...  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       We reviewed prob list, meds, xrays and labs> see below>> LABS 4/13:  FLP- at goals x TG=221 on Lip20+Lpid600;  [place]- wnl;  TSH=3.94 on Synth50.   Problem List:    << PROBLEM LIST UPDATED 11/21/11 >>  Hx of Perforated TM 1999 (w/ a paper clip!) - eval by DrKraus & DrDeaton... ~  exam 12/09 showed cerumen impactions and she had this disimpacted by ENT. ~  she continues to follow up w/ DrKraus periodically for cerumen problems & hearing tests.  HYPERTENSION (ICD-401.9) - controlled on ASA 81mg /d, TOPROL XL 50mg /d, AMLODIPINE 5mg /d, & HCT 12.5mg /d... tolerates well, and takes them regularly; denies HA, fatigue, visual changes, CP, palipit, dizziness, syncope, dyspnea, edema, etc... ~  CXR 12/09 showed normal heart size, clear lungs, old healed left clavicular fx... ~  4/11: Cardiac eval DrKatz for presyncopal spell> EKG= NSR, WNL; 2DEcho= norm LV w/ EF= 65%, norm valves, etc... ~  10/12:  BP= 130/80 & she is essentially asymptomatic... ~  4/13:  BP= 158/82 & she is stressed; discussed w/ pt & she will monitor BP at home.Marland KitchenMarland Kitchen  HYPERLIPIDEMIA (ICD-272.4) - on LIPITOR 20mg /d & GEMFIBRIZOL 600mg /d; takes these regularly & no side effects...  ~  [place] 12/07 showed TChol 193, TG 160, HDL 52, LDL 109 ~  FLP 12/08 showed TChol 177, TG 171, HDL 50, LDL 92... continue same meds. ~  [place] 12/09 showed TChol 193, TG 149, HDL 55, LDL 108 ~  FLP 12/10 showed TChol 187, TG 190, HDL 55, LDL 94 ~  FLP 10/11 showed TChol 187, TG 217, HDL 50, LDL 107... rec> better low fat diet, same meds. ~  [place] 10/12 showed TChol 187, TG 240, HDL 52, LDL 95... May need incr Lopid but she prefers to try the Synthroid 1st. ~  FLP 4/13 on Lip20+Lopid600 showed TChol 193, TG 221, HDL 58, LDL 102... Needs better low fat diet.  HYPOTHYROID >> on SYNTHROID 16mcg/d... ~  Labs 10/11 showed TSH= 4.27 ~  Labs 10/12 showed TSH= 5.74... rec to start Synthroid 50mcg/d... ~  Labs 4/13 on  Levothy50 showed TSH= 3.94  IRRITABLE BOWEL SYNDROME (ICD-564.1) - last colon 2/04 by DrPatterson was normal (f/u rec for 79yrs)... hx of hemorroids and constipation in the past (rec to take Miralax & Senakot-S)... denies nausea, vomiting, heartburn, diarrhea, constipation, blood in stool, abdominal pain, swelling, gas...  LOW BACK PAIN (ICD-724.2) - she has hx of HNP, manages OK w/ exercise (walking) and Tylenol. OSTEOPENIA (ICD-733.90) - she stopped Fosamax due to side effects but tolerated BONIVA (on hold now w/ improved BMD)... also takes ca++, MVI, Vit D 1000 daily. ~  BMD 1/08 showed TScores -0.6 in spine, and -1.4 in fem neck... ~  BMD 12/09 showed TScores +1.0 in Spine & -0.8 in left FemNeck... rec> stop Boniva. ~  12/10:  no bone symptoms- on drug holiday at present... ~  5/11: remains asymptomatic, doing well & exercises by walking. ~  labs 10/11 showed Vit D level = 56 ~  BMD 4/12 showed TScores +0.9 in Spine and -0.9 in left FemNeck... Continue off the Boniva. ~  Labs 10/12 showed Vit D level = 39... rec incr OTC supplement to 2000u daily.  ANXIETY (ICD-300.00) - uses ALPRAZOLAM 0.25mg  Prn...  HEALTH MAINTENANCE - she sees [employer] representative for GYN- PAP & Mammogram are up-to-date... BMD here 1/08 & 12/09 as noted... colonoscopy 2/04 by DrPatterson w/ rec for f/u in 10 years... she's had her 2011 Flu shot this season, had Pneumovax 6 yrs ago at age 38... received TDAP 5/11, and had the Shingles vaccine 6/11...   Past Surgical History  Procedure Date  . Abdominal hysterectomy     Outpatient Encounter Prescriptions as of 11/21/2011  Medication Sig Dispense Refill  . ALPRAZolam (XANAX) 0.25 MG tablet Take 1 tablet (0.25 mg total) by mouth 3 (three) times daily as needed.  90 tablet  5  . amLODipine (NORVASC) 5 MG tablet Take 1 tablet (5 mg total) by mouth daily.  30 tablet  1  . aspirin 81 MG tablet Take 81 mg by mouth daily.        Marland Kitchen atorvastatin (LIPITOR) 20 MG tablet Take 1 tablet (20  mg total) by mouth daily.  30 tablet  1  . Calcium Carbonate-Vitamin D (CALCIUM 600+D) 600-400 MG-UNIT per tablet Take 1 tablet by mouth daily.       . Cholecalciferol (VITAMIN D) 1000 UNITS capsule Take 1,000 Units by mouth daily.        . diphenhydramine-acetaminophen (TYLENOL PM) 25-500 MG TABS Take 1 tablet by mouth at bedtime as needed.        Marland Kitchen  gemfibrozil (LOPID) 600 MG tablet Take 1 tablet (600 mg total) by mouth daily.  30 tablet  11  . hydrochlorothiazide (MICROZIDE) 12.5 MG capsule Take 1 capsule (12.5 mg total) by mouth daily.  30 capsule  1  . levothyroxine (SYNTHROID) 50 MCG tablet Take 1 tablet (50 mcg total) by mouth daily.  90 tablet  3  . metoprolol succinate (TOPROL-XL) 50 MG 24 hr tablet Take 1 tablet (50 mg total) by mouth daily.  30 tablet  1  . Multiple Vitamin (MULTIVITAMIN) tablet Take 1 tablet by mouth daily.          Allergies  Allergen Reactions  . Alendronate Sodium     REACTION: pt states "short of breath"  . Amoxicillin     REACTION: causes severe diarrhea  . Codeine     REACTION: pt states nausea  . Valsartan     REACTION: gums/throat swelling    Current Medications, Allergies, Past Medical History, Past Surgical History, Family History, and Social History were reviewed in [employer] record.    Review of Systems        See HPI - all other systems neg except as noted... The patient denies anorexia, fever, weight loss, weight gain, vision loss, decreased hearing, hoarseness, chest pain, syncope, dyspnea on exertion, peripheral edema, prolonged cough, headaches, hemoptysis, abdominal pain, melena, hematochezia, severe indigestion/heartburn, hematuria, incontinence, muscle weakness, suspicious skin lesions, transient blindness, difficulty walking, depression, unusual weight change, abnormal bleeding, enlarged lymph nodes, and angioedema.     Objective:   Physical Exam     WD, WN, 78 y/o WF in NAD... GENERAL:  Alert & oriented;  pleasant & cooperative... HEENT:  [place]/AT, EOM-wnl, PERRLA, Fundi-benign, EACs- dry wax, TMs- not seen, NOSE-clear, THROAT-clear & wnl. NECK:  Supple w/ fairROM; no JVD; normal carotid impulses w/o bruits; no thyromegaly or nodules palpated; no lymphadenopathy. CHEST:  Clear to P & A; without wheezes/ rales/ or rhonchi. HEART:  Regular Rhythm; without murmurs/ rubs/ or gallops. ABDOMEN:  Soft & nontender; normal bowel sounds; no organomegaly or masses detected. EXT: without deformities or arthritic changes; no varicose veins/ venous insuffic/ or edema. NEURO:  CN's intact; motor testing normal; sensory testing normal; gait normal & balance OK. DERM:  No lesions noted; no rash etc...  RADIOLOGY DATA:  Reviewed in the EPIC EMR & discussed w/ the patient...  LABORATORY DATA:  Reviewed in the EPIC EMR & discussed w/ the patient...   Assessment & Plan:   HBP>  Controlled on med; continue same...     Lipids> Treated w/ [place];  With elev TGs we prob need to incr the Lopid but she wants to wait & try better low fat diet...     Hypothy>  On Synthroid 38mcg/d now & TSH is wnl...    IBS>  Last colonoscopy was 2004 (neg); she denies recent symptoms of abd pain, N V D C, or blood seen; f/u due 2004...     Osteop>  She has been off the Boniva ~43yrs and f/u BMD shows normal TScores -0.9 in left fem neck;  Continue calcium, MVI, & incr VitD to 2000u/d (VitD=39).     Anxiety>  On Alprazolam prn...   Patient's Medications  New Prescriptions   No medications on file  Previous Medications   ASPIRIN 81 MG TABLET    Take 81 mg by mouth daily.     CALCIUM CARBONATE-VITAMIN D (CALCIUM 600+D) 600-400 MG-UNIT PER TABLET    Take 1 tablet  by mouth daily.    CHOLECALCIFEROL (VITAMIN D) 1000 UNITS CAPSULE    Take 1,000 Units by mouth daily.     DIPHENHYDRAMINE-ACETAMINOPHEN (TYLENOL PM) 25-500 MG TABS    Take 1 tablet by mouth at bedtime as needed.     MULTIPLE VITAMIN (MULTIVITAMIN) TABLET     Take 1 tablet by mouth daily.    Modified Medications   Modified Medication Previous Medication   ALPRAZOLAM (XANAX) 0.25 MG TABLET ALPRAZolam (XANAX) 0.25 MG tablet      Take 1 tablet (0.25 mg total) by mouth 3 (three) times daily as needed.    Take 1 tablet (0.25 mg total) by mouth 3 (three) times daily as needed.   AMLODIPINE (NORVASC) 5 MG TABLET amLODipine (NORVASC) 5 MG tablet      Take 1 tablet (5 mg total) by mouth daily.    Take 1 tablet (5 mg total) by mouth daily.   ATORVASTATIN (LIPITOR) 20 MG TABLET atorvastatin (LIPITOR) 20 MG tablet      Take 1 tablet (20 mg total) by mouth daily.    Take 1 tablet (20 mg total) by mouth daily.   GEMFIBROZIL (LOPID) 600 MG TABLET gemfibrozil (LOPID) 600 MG tablet      Take 1 tablet (600 mg total) by mouth daily.    Take 1 tablet (600 mg total) by mouth daily.   HYDROCHLOROTHIAZIDE (MICROZIDE) 12.5 MG CAPSULE hydrochlorothiazide (MICROZIDE) 12.5 MG capsule      Take 1 capsule (12.5 mg total) by mouth daily.    Take 1 capsule (12.5 mg total) by mouth daily.   LEVOTHYROXINE (SYNTHROID) 50 MCG TABLET levothyroxine (SYNTHROID) 50 MCG tablet      Take 1 tablet (50 mcg total) by mouth daily.    Take 1 tablet (50 mcg total) by mouth daily.   METOPROLOL SUCCINATE (TOPROL-XL) 50 MG 24 HR TABLET metoprolol succinate (TOPROL-XL) 50 MG 24 hr tablet      Take 1 tablet (50 mg total) by mouth daily.    Take 1 tablet (50 mg total) by mouth daily.  Discontinued Medications   No medications on file

## 2012-02-05 ENCOUNTER — Other Ambulatory Visit: Payer: Self-pay | Admitting: Pulmonary Disease

## 2012-05-20 ENCOUNTER — Encounter: Payer: Self-pay | Admitting: *Deleted

## 2012-05-25 ENCOUNTER — Other Ambulatory Visit (INDEPENDENT_AMBULATORY_CARE_PROVIDER_SITE_OTHER): Payer: Medicare Other

## 2012-05-25 ENCOUNTER — Encounter: Payer: Self-pay | Admitting: Pulmonary Disease

## 2012-05-25 ENCOUNTER — Ambulatory Visit (INDEPENDENT_AMBULATORY_CARE_PROVIDER_SITE_OTHER): Payer: Medicare Other | Admitting: Pulmonary Disease

## 2012-05-25 VITALS — BP 126/88 | HR 66 | Temp 97.5°F | Ht 61.0 in | Wt 134.0 lb

## 2012-05-25 DIAGNOSIS — F411 Generalized anxiety disorder: Secondary | ICD-10-CM

## 2012-05-25 DIAGNOSIS — I10 Essential (primary) hypertension: Secondary | ICD-10-CM

## 2012-05-25 DIAGNOSIS — K589 Irritable bowel syndrome without diarrhea: Secondary | ICD-10-CM

## 2012-05-25 DIAGNOSIS — M949 Disorder of cartilage, unspecified: Secondary | ICD-10-CM

## 2012-05-25 DIAGNOSIS — E785 Hyperlipidemia, unspecified: Secondary | ICD-10-CM

## 2012-05-25 DIAGNOSIS — M545 Low back pain, unspecified: Secondary | ICD-10-CM

## 2012-05-25 DIAGNOSIS — Z23 Encounter for immunization: Secondary | ICD-10-CM

## 2012-05-25 DIAGNOSIS — E039 Hypothyroidism, unspecified: Secondary | ICD-10-CM

## 2012-05-25 DIAGNOSIS — M899 Disorder of bone, unspecified: Secondary | ICD-10-CM

## 2012-05-25 DIAGNOSIS — J309 Allergic rhinitis, unspecified: Secondary | ICD-10-CM

## 2012-05-25 LAB — CBC WITH DIFFERENTIAL/PLATELET
Basophils Relative: 0.7 % (ref 0.0–3.0)
Eosinophils Relative: 1.5 % (ref 0.0–5.0)
HCT: 40.7 % (ref 36.0–46.0)
Hemoglobin: 13.4 g/dL (ref 12.0–15.0)
Lymphs Abs: 1.8 10*3/uL (ref 0.7–4.0)
MCV: 91 fl (ref 78.0–100.0)
Monocytes Absolute: 0.5 10*3/uL (ref 0.1–1.0)
Neutro Abs: 3.7 10*3/uL (ref 1.4–7.7)
Neutrophils Relative %: 60.1 % (ref 43.0–77.0)
RBC: 4.47 Mil/uL (ref 3.87–5.11)
WBC: 6.2 10*3/uL (ref 4.5–10.5)

## 2012-05-25 LAB — BASIC METABOLIC PANEL
BUN: 14 mg/dL (ref 6–23)
Calcium: 9.5 mg/dL (ref 8.4–10.5)
Creatinine, Ser: 0.7 mg/dL (ref 0.4–1.2)
GFR: 83.49 mL/min (ref >60.00)

## 2012-05-25 LAB — LIPID PANEL
Cholesterol: 182 mg/dL (ref 0–200)
LDL Cholesterol: 92 mg/dL (ref 0–99)
Total CHOL/HDL Ratio: 4
Triglycerides: 196 mg/dL — ABNORMAL HIGH (ref 0.0–149.0)
VLDL: 39.2 mg/dL (ref 0.0–40.0)

## 2012-05-25 LAB — HEPATIC FUNCTION PANEL: Total Bilirubin: 0.5 mg/dL (ref 0.3–1.2)

## 2012-05-25 MED ORDER — ALPRAZOLAM 0.25 MG PO TABS: 0.2500 mg | ORAL_TABLET | Freq: Three times a day (TID) | ORAL | Status: DC | PRN

## 2012-05-25 NOTE — Patient Instructions (Addendum)
Today we updated your med list in our EPIC system...    Continue your current medications the same...    We refilled the Alprazolam per request...  Today we did your full FASTING labs...    We will notify you of the results when avail...  We gave you the 2013Flu vaccine today...  Call for any problems...  Let's plan a brief follow up visit in 6 months.Marland KitchenMarland Kitchen

## 2012-05-25 NOTE — Progress Notes (Addendum)
Subjective:    Patient ID: Emily Cannon, female    DOB: [birth year], 78 y.o.   MRN: [id number]  HPI 78 y/o WF here for a follow up visit... she has multiple medical problems including:  HBP;  Hyperlipidemia;  IBS/ Hems;  LBP;  Osteopenia;  Anxiety...  ~  May 10, 2010:  she's had a good 40mo- no new complaints or concerns... notes decr hearing & has had eval DrKraus w/ periodic ear cleaning required... BP controlled on meds;  Chol looks OK on Lip20 & [id number];  GI is stable;  and Ortho at baseline...  she had the Flu shot already...  ~  October 31, 2010:  21mo ROV & doing well- notes some scattered aches & pains, known DJD for which she takes OTC meds prn; f/u BMD shows TScores WNL & we will leave her off the Bisphos Rx, continue calcium, MVI, Vit D...  ~  May 22, 2011:  42mo ROV & she continues to do well- no new complaints or concerns; recent labs reviewed w/ pt & TG are up at 240, and TSH is 5.74 (she agrees to try better diet & start Synthroid70mcg/d)...    HBP>  on ToprolXL50, Norvasc5, HCTZ12.5; BP= 130/80, tol meds well & feeling well;  Denies CP, palpit, dizzy, SOB, edema...    Lipids> Treated w/ Lipitor20 & Lopid600; FLP shows chol ok but TG=240 & prob needs to incr Lopid but she prefers diet & start Synthroid...    Hypothy> TSH has been drifting up and today= 5.74; she notes sl decr energy & she agrees to try Synthroid 68mcg/d...    IBS>  Last colonoscopy was 2004 (neg); she denies recent symptoms of abd pain, N V D C, or blood seen...    Osteop>  She has been off the Boniva ~69yrs and f/u BMD shows normal TScores -0.9 in left fem neck;  Continue calcium, MVI, & incr VitD to 2000u/d (VitD=39).    Anxiety>  On Alprazolam prn...  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 ; she  will monitor BP closely & call for problems...       We reviewed prob list, meds, xrays and labs> see below>> LABS 4/13:  FLP- at goals x TG=221 on Lip20+Lpid600;  Chems- wnl;  TSH=3.94 on Synth50.  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    Lipids> Treated w/ Lipitor20 & Lopid600; FLP shows Chol ok but TG=196 & prob needs to incr Lopid but she prefers diet & exercise...    Hypothy> on Synthroid50 now; TSH= 2.40; she notes improved energy on Rx...    IBS>  Last colonoscopy was 2004 (neg); she denies recent symptoms of abd pain, N V D C, or blood seen...    DJD> known LBP & c/o left hip pain- using OTC analgesics w/ Aleve & Tylenol...    Osteop>  She has been off the Boniva ~8yrs and f/u BMD shows normal TScores -0.9 in left fem neck;  Continue  calcium, MVI, & incr VitD to 2000u/d (VitD=39).    Anxiety>  On Alprazolam prn... We reviewed prob list, meds, xrays and labs> see below for updates >> OK Flu shot today... LABS 10/13:  FLP- ok on Lip20+Gemfib600 x TG=196;  Chems- wnl x BS=106;  CBC- wnl;  TSH=2.40          Problem List:     Hx of Perforated TM 1999 (w/ a paper clip!) - eval by DrKraus & DrDeaton... ~  exam 12/09 showed cerumen impactions and she had this disimpacted by ENT. ~  she continues to follow up w/ DrKraus periodically for cerumen problems & hearing tests.  HYPERTENSION (ICD-401.9) - controlled on ASA 81mg /d, TOPROL XL 50mg /d, AMLODIPINE 5mg /d, & HCT 12.5mg /d... tolerates well, and takes them regularly; denies HA, fatigue, visual changes, CP, palipit, dizziness, syncope, dyspnea, edema, etc... ~  CXR 12/09 showed normal heart size, clear lungs, old healed left clavicular fx... ~  4/11: Cardiac eval DrKatz for presyncopal spell> EKG= NSR, WNL; 2DEcho= norm LV w/ EF=  65%, norm valves, etc... ~  10/12:  BP= 130/80 & she is essentially asymptomatic... ~  4/13:  BP= 158/82 & she is stressed; discussed w/ pt & she will monitor BP at home... ~  10/13:  BP= 126/88 & she denies CP, palpit, SOB, edema...  HYPERLIPIDEMIA (ICD-272.4) - on LIPITOR 20mg /d & GEMFIBRIZOL 600mg /d; takes these regularly & no side effects...  ~  [place] 12/07 showed TChol 193, TG 160, HDL 52, LDL 109 ~  FLP 12/08 showed TChol 177, TG 171, HDL 50, LDL 92... continue same meds. ~  [place] 12/09 showed TChol 193, TG 149, HDL 55, LDL 108 ~  FLP 12/10 showed TChol 187, TG 190, HDL 55, LDL 94 ~  FLP 10/11 showed TChol 187, TG 217, HDL 50, LDL 107... rec> better low fat diet, same meds. ~  [place] 10/12 showed TChol 187, TG 240, HDL 52, LDL 95... May need incr Lopid but she prefers to try the Synthroid 1st. ~  FLP 4/13 on Lip20+Lopid600 showed TChol 193, TG 221, HDL 58, LDL 102... Needs better low fat diet. ~  FLP 10/13 on Lip20+Lopid600 showed TChol 182, TG 198, HDL 51, LDL 92  HYPOTHYROID >> on SYNTHROID 88mcg/d... ~  Labs 10/11 showed TSH= 4.27 ~  Labs 10/12 showed TSH= 5.74... rec to start Synthroid 60mcg/d... ~  Labs 4/13 on Levothy50 showed TSH= 3.94 ~  Labs 10/13 on Levo50 showed TSH= 2.40  IRRITABLE BOWEL SYNDROME (ICD-564.1) - last colon 2/04 by DrPatterson was normal (f/u rec for 38yrs)... hx of hemorroids and constipation in the past (rec to take Miralax & Senakot-S)... denies nausea, vomiting, heartburn, diarrhea, constipation, blood in stool, abdominal pain, swelling, gas...  LOW BACK PAIN (ICD-724.2) - she has hx of HNP, manages OK w/ exercise (walking) and Tylenol. OSTEOPENIA (ICD-733.90) - she stopped Fosamax due to side effects but tolerated BONIVA (on hold now w/ improved BMD)... also takes ca++, MVI, Vit D 1000 daily. ~  BMD 1/08 showed TScores -0.6 in spine, and -1.4 in fem neck... ~  BMD 12/09 showed TScores +1.0 in Spine & -0.8 in left FemNeck... rec> stop Boniva. ~  12/10:  no bone  symptoms- on drug holiday at present... ~  5/11: remains asymptomatic, doing well & exercises by walking. ~  labs 10/11 showed Vit D level = 56 ~  BMD 4/12 showed TScores +0.9 in Spine and -0.9 in left FemNeck... Continue off the Boniva. ~  Labs 10/12 showed  Vit D level = 39... rec incr OTC supplement to 2000u daily. ~  10/13:  C/o left hip pain & Rx w/ OTC Aleve, Tylenol...  ANXIETY (ICD-300.00) - uses ALPRAZOLAM 0.25mg  Prn...  HEALTH MAINTENANCE - she sees Holiday representative for GYN- PAP & Mammogram are up-to-date... BMD here 1/08 & 12/09 as noted... colonoscopy 2/04 by DrPatterson w/ rec for f/u in 10 years... she's had her 2011 Flu shot this season, had Pneumovax 6 yrs ago at age 37... received TDAP 5/11, and had the Shingles vaccine 6/11...   Past Surgical History  Procedure Date  . Abdominal hysterectomy     Outpatient Encounter Prescriptions as of 05/25/2012  Medication Sig Dispense Refill  . ALPRAZolam (XANAX) 0.25 MG tablet Take 1 tablet (0.25 mg total) by mouth 3 (three) times daily as needed.  90 tablet  5  . amLODipine (NORVASC) 5 MG tablet Take 1 tablet (5 mg total) by mouth daily.  30 tablet  11  . aspirin 81 MG tablet Take 81 mg by mouth daily.        Marland Kitchen atorvastatin (LIPITOR) 20 MG tablet Take 1 tablet (20 mg total) by mouth daily.  30 tablet  11  . Calcium Carbonate-Vitamin D (CALCIUM 600+D) 600-400 MG-UNIT per tablet Take 1 tablet by mouth daily.       . Cholecalciferol (VITAMIN D) 1000 UNITS capsule Take 1,000 Units by mouth daily.        . diphenhydramine-acetaminophen (TYLENOL PM) 25-500 MG TABS Take 1 tablet by mouth at bedtime as needed.        Marland Kitchen gemfibrozil (LOPID) 600 MG tablet Take 1 tablet (600 mg total) by mouth daily.  30 tablet  11  . hydrochlorothiazide (MICROZIDE) 12.5 MG capsule Take 1 capsule (12.5 mg total) by mouth daily.  30 capsule  11  . levothyroxine (SYNTHROID) 50 MCG tablet Take 1 tablet (50 mcg total) by mouth daily.  90 tablet  3  . levothyroxine  (SYNTHROID, LEVOTHROID) 50 MCG tablet TAKE ONE TABLET BY MOUTH DAILY.  90 tablet  2  . metoprolol succinate (TOPROL-XL) 50 MG 24 hr tablet Take 1 tablet (50 mg total) by mouth daily.  30 tablet  11  . Multiple Vitamin (MULTIVITAMIN) tablet Take 1 tablet by mouth daily.          Allergies  Allergen Reactions  . Alendronate Sodium     REACTION: pt states "short of breath"  . Amoxicillin     REACTION: causes severe diarrhea  . Codeine     REACTION: pt states nausea  . Valsartan     REACTION: gums/throat swelling    Current Medications, Allergies, Past Medical History, Past Surgical History, Family History, and Social History were reviewed in [employer] record.    Review of Systems        See HPI - all other systems neg except as noted... The patient denies anorexia, fever, weight loss, weight gain, vision loss, decreased hearing, hoarseness, chest pain, syncope, dyspnea on exertion, peripheral edema, prolonged cough, headaches, hemoptysis, abdominal pain, melena, hematochezia, severe indigestion/heartburn, hematuria, incontinence, muscle weakness, suspicious skin lesions, transient blindness, difficulty walking, depression, unusual weight change, abnormal bleeding, enlarged lymph nodes, and angioedema.     Objective:   Physical Exam     WD, WN, 78 y/o WF in NAD... GENERAL:  Alert & oriented; pleasant & cooperative... HEENT:  [place]/AT, EOM-wnl, PERRLA, Fundi-benign, EACs- dry wax, TMs- not seen, NOSE-clear, THROAT-clear & wnl. NECK:  Supple w/ fairROM; no JVD;  normal carotid impulses w/o bruits; no thyromegaly or nodules palpated; no lymphadenopathy. CHEST:  Clear to P & A; without wheezes/ rales/ or rhonchi. HEART:  Regular Rhythm; without murmurs/ rubs/ or gallops. ABDOMEN:  Soft & nontender; normal bowel sounds; no organomegaly or masses detected. EXT: without deformities or arthritic changes; no varicose veins/ venous insuffic/ or edema. NEURO:  CN's intact;  motor testing normal; sensory testing normal; gait normal & balance OK. DERM:  No lesions noted; no rash etc...  RADIOLOGY DATA:  Reviewed in the EPIC EMR & discussed w/ the patient...  LABORATORY DATA:  Reviewed in the EPIC EMR & discussed w/ the patient...   Assessment & Plan:    HBP>  Controlled on med; continue same...     Lipids> Treated w/ [place];  With elev TGs may need to incr the Lopid but she wants to wait & try better low fat diet...     Hypothy>  On Synthroid 67mcg/d now & TSH is wnl...    IBS>  Last colonoscopy was 2004 (neg); she denies recent symptoms of abd pain, N V D C, or blood seen; f/u due 2004...     Osteop>  She has been off the Boniva ~71yrs and f/u BMD shows normal TScores -0.9 in left fem neck;  Continue calcium, MVI, & incr VitD to 2000u/d (VitD=39).     Anxiety>  On Alprazolam prn...   Patient's Medications  New Prescriptions   No medications on file  Previous Medications   AMLODIPINE (NORVASC) 5 MG TABLET    Take 1 tablet (5 mg total) by mouth daily.   ASPIRIN 81 MG TABLET    Take 81 mg by mouth daily.     ATORVASTATIN (LIPITOR) 20 MG TABLET    Take 1 tablet (20 mg total) by mouth daily.   CALCIUM CARBONATE-VITAMIN D (CALCIUM 600+D) 600-400 MG-UNIT PER TABLET    Take 1 tablet by mouth daily.    CHOLECALCIFEROL (VITAMIN D) 1000 UNITS CAPSULE    Take 1,000 Units by mouth daily.     DIPHENHYDRAMINE-ACETAMINOPHEN (TYLENOL PM) 25-500 MG TABS    Take 1 tablet by mouth at bedtime as needed.     GEMFIBROZIL (LOPID) 600 MG TABLET    Take 1 tablet (600 mg total) by mouth daily.   HYDROCHLOROTHIAZIDE (MICROZIDE) 12.5 MG CAPSULE    Take 1 capsule (12.5 mg total) by mouth daily.   LEVOTHYROXINE (SYNTHROID) 50 MCG TABLET    Take 1 tablet (50 mcg total) by mouth daily.   LEVOTHYROXINE (SYNTHROID, LEVOTHROID) 50 MCG TABLET    TAKE ONE TABLET BY MOUTH DAILY.   METOPROLOL SUCCINATE (TOPROL-XL) 50 MG 24 HR TABLET    Take 1 tablet (50 mg total) by mouth  daily.   MULTIPLE VITAMIN (MULTIVITAMIN) TABLET    Take 1 tablet by mouth daily.    Modified Medications   Modified Medication Previous Medication   ALPRAZOLAM (XANAX) 0.25 MG TABLET ALPRAZolam (XANAX) 0.25 MG tablet      Take 1 tablet (0.25 mg total) by mouth 3 (three) times daily as needed for anxiety.    Take 1 tablet (0.25 mg total) by mouth 3 (three) times daily as needed.  Discontinued Medications   No medications on file

## 2012-07-14 ENCOUNTER — Other Ambulatory Visit: Payer: Self-pay | Admitting: Pulmonary Disease

## 2012-07-14 NOTE — Telephone Encounter (Signed)
Refill sent to the pharmacy in oct with 5 refills.  Nothing further is needed.

## 2012-11-03 ENCOUNTER — Other Ambulatory Visit: Payer: Self-pay | Admitting: Pulmonary Disease

## 2012-11-03 MED ORDER — GEMFIBROZIL 600 MG PO TABS: 600.0000 mg | ORAL_TABLET | Freq: Every day | ORAL | Status: DC

## 2012-11-05 ENCOUNTER — Other Ambulatory Visit: Payer: Self-pay | Admitting: Pulmonary Disease

## 2012-11-09 ENCOUNTER — Telehealth: Payer: Self-pay | Admitting: Pulmonary Disease

## 2012-11-09 NOTE — Telephone Encounter (Signed)
Per SN not likely caused by Norvasc,elevate legs apply a cool compress if gets worse ,or feels like throat is closing up ,sob, Call 911.  Pt aware will call if gets worse.  Nothing further needed.

## 2012-11-09 NOTE — Telephone Encounter (Signed)
Spoke with pt just got her norvasc filled on 4-4 noticed today that both legs are pink in color.left leg seems to be the worse.  (L) ankle is swollen. Denies any itching ,pain and not hot to touch. No sob,wheezing .advised pt if she felt like she was  Getting sob,or unable to breath to call 911 .  Allergies  Allergen Reactions  . Alendronate Sodium     REACTION: pt states "short of breath"  . Amoxicillin     REACTION: causes severe diarrhea  . Codeine     REACTION: pt states nausea  . Valsartan     REACTION: gums/throat swelling   Dr Lenna Gilford please advise. Thank you

## 2012-11-10 ENCOUNTER — Telehealth: Payer: Self-pay | Admitting: Pulmonary Disease

## 2012-11-10 ENCOUNTER — Encounter: Payer: Self-pay | Admitting: Adult Health

## 2012-11-10 ENCOUNTER — Ambulatory Visit (INDEPENDENT_AMBULATORY_CARE_PROVIDER_SITE_OTHER): Payer: Medicare Other | Admitting: Adult Health

## 2012-11-10 VITALS — BP 140/82 | HR 62 | Temp 98.0°F | Ht 61.0 in | Wt 137.0 lb

## 2012-11-10 DIAGNOSIS — L259 Unspecified contact dermatitis, unspecified cause: Secondary | ICD-10-CM

## 2012-11-10 DIAGNOSIS — L309 Dermatitis, unspecified: Secondary | ICD-10-CM

## 2012-11-10 NOTE — Assessment & Plan Note (Signed)
Mild Dermatitis -? Stasis dermatatic changes vs dry skin w/ localized irriatation . No signs of infectious source. Pulses are intact w/ minimal edema.  Will treat conservatively . Hold off on labs today as she is returning for CPX next week with fasting labs.   Plan  Use sensitive skin soaps and lotions Keep legs elevated.  May use Eucerin skin lotion for sensitive skin .  Call if develop fever, redness, or worsening of symptoms  Follow up Dr. Lenna Gilford  For Physical next week with labs.  Please contact office for sooner follow up if symptoms do not improve or worsen or seek emergency care

## 2012-11-10 NOTE — Telephone Encounter (Signed)
SN has no openings today. TP has no openings in [place] office but is in [place] office this am with openings. Called pt back.  Spoke with her husband, per her request.  He knows where [facility] is and ok with taking pt to that office.  We have scheduled pt to see TP today at 10:30 am in [place].  She has [place] phone # incase they need further directions.  Nothing further needed at this time.

## 2012-11-10 NOTE — Patient Instructions (Addendum)
Use sensitive skin soaps and lotions Keep legs elevated.  May use Eucerin skin lotion for sensitive skin .  Call if develop fever, redness, or worsening of symptoms  Follow up Dr. Lenna Gilford  For Physical next week with labs.  Please contact office for sooner follow up if symptoms do not improve or worsen or seek emergency care

## 2012-11-10 NOTE — Telephone Encounter (Signed)
Pt called in yesterday and was advised:  Emily Cannon, CMA at 11/09/2012 5:20 PM   Status: Signed            Per SN not likely caused by Norvasc,elevate legs apply a cool compress if gets worse ,or feels like throat is closing up ,sob,  Call 911.  Pt aware will call if gets worse.  Nothing further needed.        ------  Called, spoke with pt.  Reports both legs are still red from ankle up approx 7 inches and the tops of her feet are red as well.  L > R.  Ankles are "a little swollen."  Reports this is unchanged since yesterday.  Also reports she took BP yesterday at 4:30 pm = 183/65 and at 5:30 pm it was 187/75; pulse 63.  C/o dull HA x few days and feeling nervous with these symptoms.  Denies any pain in legs or calves, itching, SOB, wheezing, chest tightness, chest pain, f/c/s, or either leg feeling warm/hot to touch.  Setates she is using the cool compresses and elevating legs since yesterday but still no change.  Requesting OV today.  Dr. Lenna Gilford, pls advise if pt can be worked in.  Thank you.

## 2012-11-10 NOTE — Progress Notes (Signed)
Subjective:    Patient ID: Emily Cannon, female    DOB: 03/06/1933, 79 y.o.   MRN: [id number]  HPI 79 y/o WF -hx of multiple medical problems including:  HBP;  Hyperlipidemia;  IBS/ Hems;  LBP;  Osteopenia;  Anxiety...  ~  May 10, 2010:  she's had a good 42mo- no new complaints or concerns... notes decr hearing & has had eval DrKraus w/ periodic ear cleaning required... BP controlled on meds;  Chol looks OK on Lip20 & [id number];  GI is stable;  and Ortho at baseline...  she had the Flu shot already...  ~  October 31, 2010:  75mo ROV & doing well- notes some scattered aches & pains, known DJD for which she takes OTC meds prn; f/u BMD shows TScores WNL & we will leave her off the Bisphos Rx, continue calcium, MVI, Vit D...  ~  May 22, 2011:  51mo ROV & she continues to do well- no new complaints or concerns; recent labs reviewed w/ pt & TG are up at 240, and TSH is 5.74 (she agrees to try better diet & start Synthroid40mcg/d)...    HBP>  on ToprolXL50, Norvasc5, HCTZ12.5; BP= 130/80, tol meds well & feeling well;  Denies CP, palpit, dizzy, SOB, edema...    Lipids> Treated w/ Lipitor20 & Lopid600; FLP shows chol ok but TG=240 & prob needs to incr Lopid but she prefers diet & start Synthroid...    Hypothy> TSH has been drifting up and today= 5.74; she notes sl decr energy & she agrees to try Synthroid 4mcg/d...    IBS>  Last colonoscopy was 2004 (neg); she denies recent symptoms of abd pain, N V D C, or blood seen...    Osteop>  She has been off the Boniva ~44yrs and f/u BMD shows normal TScores -0.9 in left fem neck;  Continue calcium, MVI, & incr VitD to 2000u/d (VitD=39).    Anxiety>  On Alprazolam prn...  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 ; she will monitor BP closely & call  for problems...       We reviewed prob list, meds, xrays and labs> see below>> LABS 4/13:  FLP- at goals x TG=221 on Lip20+Lpid600;  Chems- wnl;  TSH=3.94 on Synth50.  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    Lipids> Treated w/ Lipitor20 & Lopid600; FLP shows Chol ok but TG=196 & prob needs to incr Lopid but she prefers diet & exercise...    Hypothy> on Synthroid50 now; TSH= 2.40; she notes improved energy on Rx...    IBS>  Last colonoscopy was 2004 (neg); she denies recent symptoms of abd pain, N V D C, or blood seen...    DJD> known LBP & c/o left hip pain- using OTC analgesics w/ Aleve & Tylenol...    Osteop>  She has been off the Boniva ~66yrs and f/u BMD shows normal TScores -0.9 in left fem neck;  Continue calcium, MVI, & incr VitD to  2000u/d (VitD=39).    Anxiety>  On Alprazolam prn... We reviewed prob list, meds, xrays and labs> see below for updates >> OK Flu shot today... LABS 10/13:  FLP- ok on Lip20+Gemfib600 x TG=196;  Chems- wnl x BS=106;  CBC- wnl;  TSH=2.40   11/10/2012 Acute OV  Complains of discoloration along front of lower legs for 1 month. Ankles puffy for last 3 days .  Has been walking a lot lately. Standing more on her feet.  Shaves legs daily and has very dry skin.  Concerned that her norvasc is causing or the dye in her fluid pills. No calf pain, dyspnea, itching , fever, increased redness , red streaks, claudication symptoms, dizziness, syncope or chest pain . No hemoptysis. No new meds or travel.  Denies pain to the touch, no warmth to the touch. Pt states that she hasn't been on her feet a lot or increased her salt intake..         Problem List:     Hx of Perforated TM 1999 (w/ a paper clip!) - eval by DrKraus & DrDeaton... ~  exam 12/09 showed cerumen  impactions and she had this disimpacted by ENT. ~  she continues to follow up w/ DrKraus periodically for cerumen problems & hearing tests.  HYPERTENSION (ICD-401.9) - controlled on ASA 81mg /d, TOPROL XL 50mg /d, AMLODIPINE 5mg /d, & HCT 12.5mg /d... tolerates well, and takes them regularly; denies HA, fatigue, visual changes, CP, palipit, dizziness, syncope, dyspnea, edema, etc... ~  CXR 12/09 showed normal heart size, clear lungs, old healed left clavicular fx... ~  4/11: Cardiac eval DrKatz for presyncopal spell> EKG= NSR, WNL; 2DEcho= norm LV w/ EF= 65%, norm valves, etc... ~  10/12:  BP= 130/80 & she is essentially asymptomatic... ~  4/13:  BP= 158/82 & she is stressed; discussed w/ pt & she will monitor BP at home... ~  10/13:  BP= 126/88 & she denies CP, palpit, SOB, edema...  HYPERLIPIDEMIA (ICD-272.4) - on LIPITOR 20mg /d & GEMFIBRIZOL 600mg /d; takes these regularly & no side effects...  ~  [place] 12/07 showed TChol 193, TG 160, HDL 52, LDL 109 ~  FLP 12/08 showed TChol 177, TG 171, HDL 50, LDL 92... continue same meds. ~  [place] 12/09 showed TChol 193, TG 149, HDL 55, LDL 108 ~  FLP 12/10 showed TChol 187, TG 190, HDL 55, LDL 94 ~  FLP 10/11 showed TChol 187, TG 217, HDL 50, LDL 107... rec> better low fat diet, same meds. ~  [place] 10/12 showed TChol 187, TG 240, HDL 52, LDL 95... May need incr Lopid but she prefers to try the Synthroid 1st. ~  FLP 4/13 on Lip20+Lopid600 showed TChol 193, TG 221, HDL 58, LDL 102... Needs better low fat diet. ~  FLP 10/13 on Lip20+Lopid600 showed TChol 182, TG 198, HDL 51, LDL 92  HYPOTHYROID >> on SYNTHROID 29mcg/d... ~  Labs 10/11 showed TSH= 4.27 ~  Labs 10/12 showed TSH= 5.74... rec to start Synthroid 52mcg/d... ~  Labs 4/13 on Levothy50 showed TSH= 3.94 ~  Labs 10/13 on Levo50 showed TSH= 2.40  IRRITABLE BOWEL SYNDROME (ICD-564.1) - last colon 2/04 by DrPatterson was normal (f/u rec for 80yrs)... hx of hemorroids and constipation in the past (rec to take  Miralax & Senakot-S)... denies nausea, vomiting, heartburn, diarrhea, constipation, blood in stool, abdominal pain, swelling, gas...  LOW BACK PAIN (ICD-724.2) - she has hx of HNP, manages OK w/ exercise (walking) and Tylenol. OSTEOPENIA (ICD-733.90) - she stopped Fosamax due to  side effects but tolerated BONIVA (on hold now w/ improved BMD)... also takes ca++, MVI, Vit D 1000 daily. ~  BMD 1/08 showed TScores -0.6 in spine, and -1.4 in fem neck... ~  BMD 12/09 showed TScores +1.0 in Spine & -0.8 in left FemNeck... rec> stop Boniva. ~  12/10:  no bone symptoms- on drug holiday at present... ~  5/11: remains asymptomatic, doing well & exercises by walking. ~  labs 10/11 showed Vit D level = 56 ~  BMD 4/12 showed TScores +0.9 in Spine and -0.9 in left FemNeck... Continue off the Boniva. ~  Labs 10/12 showed Vit D level = 39... rec incr OTC supplement to 2000u daily. ~  10/13:  C/o left hip pain & Rx w/ OTC Aleve, Tylenol...  ANXIETY (ICD-300.00) - uses ALPRAZOLAM 0.25mg  Prn...  HEALTH MAINTENANCE - she sees Holiday representative for GYN- PAP & Mammogram are up-to-date... BMD here 1/08 & 12/09 as noted... colonoscopy 2/04 by DrPatterson w/ rec for f/u in 10 years... she's had her 2011 Flu shot this season, had Pneumovax 6 yrs ago at age 23... received TDAP 5/11, and had the Shingles vaccine 6/11...   Past Surgical History  Procedure Laterality Date  . Abdominal hysterectomy      Outpatient Encounter Prescriptions as of 11/10/2012  Medication Sig Dispense Refill  . ALPRAZolam (XANAX) 0.25 MG tablet Take 1 tablet (0.25 mg total) by mouth 3 (three) times daily as needed for anxiety.  90 tablet  5  . amLODipine (NORVASC) 5 MG tablet Take 1 tablet (5 mg total) by mouth daily.  30 tablet  11  . aspirin 81 MG tablet Take 81 mg by mouth daily.        Marland Kitchen atorvastatin (LIPITOR) 20 MG tablet Take 1 tablet (20 mg total) by mouth daily.  30 tablet  11  . Calcium Carbonate-Vitamin D (CALCIUM 600+D) 600-400 MG-UNIT per  tablet Take 1 tablet by mouth daily.       . Cholecalciferol (VITAMIN D) 1000 UNITS capsule Take 1,000 Units by mouth daily.        . diphenhydramine-acetaminophen (TYLENOL PM) 25-500 MG TABS Take 1 tablet by mouth at bedtime as needed.        Marland Kitchen gemfibrozil (LOPID) 600 MG tablet Take 1 tablet (600 mg total) by mouth daily.  30 tablet  5  . hydrochlorothiazide (MICROZIDE) 12.5 MG capsule Take 1 capsule (12.5 mg total) by mouth daily.  30 capsule  11  . levothyroxine (SYNTHROID, LEVOTHROID) 50 MCG tablet TAKE ONE TABLET BY MOUTH DAILY.  90 tablet  1  . metoprolol succinate (TOPROL-XL) 50 MG 24 hr tablet Take 1 tablet (50 mg total) by mouth daily.  30 tablet  11  . Multiple Vitamin (MULTIVITAMIN) tablet Take 1 tablet by mouth daily.        . [DISCONTINUED] levothyroxine (SYNTHROID) 50 MCG tablet Take 1 tablet (50 mcg total) by mouth daily.  90 tablet  3  . [DISCONTINUED] levothyroxine (SYNTHROID, LEVOTHROID) 50 MCG tablet TAKE ONE TABLET BY MOUTH DAILY.  90 tablet  2   No facility-administered encounter medications on file as of 11/10/2012.    Allergies  Allergen Reactions  . Alendronate Sodium     REACTION: pt states "short of breath"  . Amoxicillin     REACTION: causes severe diarrhea  . Codeine     REACTION: pt states nausea  . Valsartan     REACTION: gums/throat swelling    Current Medications, Allergies, Past Medical History, Past Surgical  History, Family History, and Social History were reviewed in [employer] record.    Review of Systems        Constitutional:   No  weight loss, night sweats,  Fevers, chills, fatigue, or  lassitude.  HEENT:   No headaches,  Difficulty swallowing,  Tooth/dental problems, or  Sore throat,                No sneezing, itching, ear ache, nasal congestion, post nasal drip,   CV:  No chest pain,  Orthopnea, PND, swelling in lower extremities, anasarca, dizziness, palpitations, syncope.   GI  No heartburn, indigestion,  abdominal pain, nausea, vomiting, diarrhea, change in bowel habits, loss of appetite, bloody stools.   Resp: No shortness of breath with exertion or at rest.  No excess mucus, no productive cough,  No non-productive cough,  No coughing up of blood.  No change in color of mucus.  No wheezing.  No chest wall deformity  Skin: +discoloration along front of lower legs .  GU: no dysuria, change in color of urine, no urgency or frequency.  No flank pain, no hematuria   MS:  No joint pain or swelling.  No decreased range of motion.  No back pain.  Psych:  No change in mood or affect. No depression + anxiety.  No memory loss.       Objective:   Physical Exam     WD, WN, 79 y/o WF in NAD... GENERAL:  Alert & oriented; pleasant & cooperative... HEENT:  /AT,  EACs- dry wax, TMs- not seen, NOSE-clear, THROAT-clear & wnl. NECK:  Supple w/ fairROM; no JVD; normal carotid impulses w/o bruits; no thyromegaly or nodules palpated; no lymphadenopathy. CHEST:  Clear to P & A; without wheezes/ rales/ or rhonchi. HEART:  Regular Rhythm; without murmurs/ rubs/ or gallops. Pulses intact, none to minimal edema along ankles. Mild venous insufficiency changes . Neg homans sign  ABDOMEN:  Soft & nontender; normal bowel sounds; no organomegaly or masses detected. EXT: without deformities or arthritic changes; no varicose veins/ venous insuffic/ or edema. NEURO:  CN's intact; motor testing normal; sensory testing normal; gait normal & balance OK. DERM: along anterior lower legs with mild skin discoloration ? Mild stasis dermatitis , siginificant dry skin along lower legs, feet .    Assessment & Plan:

## 2012-11-18 ENCOUNTER — Telehealth: Payer: Self-pay | Admitting: Pulmonary Disease

## 2012-11-18 MED ORDER — HYDRALAZINE HCL 25 MG PO TABS: 25.0000 mg | ORAL_TABLET | Freq: Two times a day (BID) | ORAL | Status: DC

## 2012-11-18 NOTE — Telephone Encounter (Signed)
Per SN---  Call in apresoline 25 mg  1 po bid  #60 and keep her follow up appt with SN.  thanks

## 2012-11-18 NOTE — Telephone Encounter (Signed)
Spoke with patient Had MD appt on Monday 4/21 to have ears checked and they check her BP it was 185/65 rechecked it prior to leaving and it was 170/80 Yesterday morning 152/56 with BP medication. (metoprolol, amlodipine and hctz) This morning 171/65 with taking her all BP medications and a "nerve pill" Each time pressure was checked it was approx 30-45 min after taking medication States she has had mild headache but thinks this is related to her other medications and also states she has had some vision problems but thinks this may be cataract related Patients next appt 11/23/12 Requesting recs per Dr. Lenna Gilford, please advise  Last OV:11/10/12 Next OV:11/23/12  Current Outpatient Prescriptions on File Prior to Visit  Medication Sig Dispense Refill  . ALPRAZolam (XANAX) 0.25 MG tablet Take 1 tablet (0.25 mg total) by mouth 3 (three) times daily as needed for anxiety.  90 tablet  5  . amLODipine (NORVASC) 5 MG tablet Take 1 tablet (5 mg total) by mouth daily.  30 tablet  11  . aspirin 81 MG tablet Take 81 mg by mouth daily.        Marland Kitchen atorvastatin (LIPITOR) 20 MG tablet Take 1 tablet (20 mg total) by mouth daily.  30 tablet  11  . Calcium Carbonate-Vitamin D (CALCIUM 600+D) 600-400 MG-UNIT per tablet Take 1 tablet by mouth daily.       . Cholecalciferol (VITAMIN D) 1000 UNITS capsule Take 1,000 Units by mouth daily.        . diphenhydramine-acetaminophen (TYLENOL PM) 25-500 MG TABS Take 1 tablet by mouth at bedtime as needed.        Marland Kitchen gemfibrozil (LOPID) 600 MG tablet Take 1 tablet (600 mg total) by mouth daily.  30 tablet  5  . hydrochlorothiazide (MICROZIDE) 12.5 MG capsule Take 1 capsule (12.5 mg total) by mouth daily.  30 capsule  11  . levothyroxine (SYNTHROID, LEVOTHROID) 50 MCG tablet TAKE ONE TABLET BY MOUTH DAILY.  90 tablet  1  . metoprolol succinate (TOPROL-XL) 50 MG 24 hr tablet Take 1 tablet (50 mg total) by mouth daily.  30 tablet  11  . Multiple Vitamin (MULTIVITAMIN) tablet Take 1  tablet by mouth daily.         No current facility-administered medications on file prior to visit.         Allergies  Allergen Reactions  . Alendronate Sodium     REACTION: pt states "short of breath"  . Amoxicillin     REACTION: causes severe diarrhea  . Codeine     REACTION: pt states nausea  . Valsartan     REACTION: gums/throat swelling

## 2012-11-18 NOTE — Telephone Encounter (Signed)
Patient has allergy to Valsartan. Dr. Lenna Gilford please advise, thank you   Allergies  Allergen Reactions  . Alendronate Sodium     REACTION: pt states "short of breath"  . Amoxicillin     REACTION: causes severe diarrhea  . Codeine     REACTION: pt states nausea  . Valsartan     REACTION: gums/throat swelling

## 2012-11-18 NOTE — Telephone Encounter (Signed)
lmomtcb for pt 

## 2012-11-18 NOTE — Telephone Encounter (Signed)
Spoke with patient-aware of Rx and has been sent to [facility]. Pt will keep 11-23-12 appt with SN.

## 2012-11-18 NOTE — Telephone Encounter (Signed)
Patient returning call.

## 2012-11-18 NOTE — Telephone Encounter (Signed)
Per SN---  Add losartan 50 mg  #30  1 daily.   thanks

## 2012-11-20 ENCOUNTER — Other Ambulatory Visit: Payer: Self-pay | Admitting: Pulmonary Disease

## 2012-11-23 ENCOUNTER — Ambulatory Visit (INDEPENDENT_AMBULATORY_CARE_PROVIDER_SITE_OTHER): Payer: Medicare Other | Admitting: Pulmonary Disease

## 2012-11-23 ENCOUNTER — Other Ambulatory Visit (INDEPENDENT_AMBULATORY_CARE_PROVIDER_SITE_OTHER): Payer: Medicare Other

## 2012-11-23 ENCOUNTER — Encounter: Payer: Self-pay | Admitting: Pulmonary Disease

## 2012-11-23 VITALS — BP 120/64 | HR 61 | Temp 98.4°F | Ht 61.0 in | Wt 136.2 lb

## 2012-11-23 DIAGNOSIS — K589 Irritable bowel syndrome without diarrhea: Secondary | ICD-10-CM

## 2012-11-23 DIAGNOSIS — E785 Hyperlipidemia, unspecified: Secondary | ICD-10-CM

## 2012-11-23 DIAGNOSIS — M899 Disorder of bone, unspecified: Secondary | ICD-10-CM

## 2012-11-23 DIAGNOSIS — M545 Low back pain: Secondary | ICD-10-CM

## 2012-11-23 DIAGNOSIS — F411 Generalized anxiety disorder: Secondary | ICD-10-CM

## 2012-11-23 DIAGNOSIS — I10 Essential (primary) hypertension: Secondary | ICD-10-CM

## 2012-11-23 DIAGNOSIS — I872 Venous insufficiency (chronic) (peripheral): Secondary | ICD-10-CM | POA: Insufficient documentation

## 2012-11-23 DIAGNOSIS — E039 Hypothyroidism, unspecified: Secondary | ICD-10-CM

## 2012-11-23 LAB — LIPID PANEL
Cholesterol: 151 mg/dL (ref 0–200)
HDL: 52.2 mg/dL
LDL Cholesterol: 66 mg/dL (ref 0–99)
Total CHOL/HDL Ratio: 3
Triglycerides: 162 mg/dL — ABNORMAL HIGH (ref 0.0–149.0)
VLDL: 32.4 mg/dL (ref 0.0–40.0)

## 2012-11-23 LAB — BASIC METABOLIC PANEL WITH GFR
BUN: 16 mg/dL (ref 6–23)
CO2: 29 meq/L (ref 19–32)
Calcium: 9.5 mg/dL (ref 8.4–10.5)
Chloride: 103 meq/L (ref 96–112)
Creatinine, Ser: 0.8 mg/dL (ref 0.4–1.2)
GFR: 77.16 mL/min
Glucose, Bld: 104 mg/dL — ABNORMAL HIGH (ref 70–99)
Potassium: 3.7 meq/L (ref 3.5–5.1)
Sodium: 139 meq/L (ref 135–145)

## 2012-11-23 MED ORDER — HYDRALAZINE HCL 25 MG PO TABS: 25.0000 mg | ORAL_TABLET | Freq: Two times a day (BID) | ORAL | Status: DC

## 2012-11-23 MED ORDER — LEVOTHYROXINE SODIUM 50 MCG PO TABS: ORAL_TABLET | ORAL | Status: DC

## 2012-11-23 MED ORDER — HYDROCHLOROTHIAZIDE 12.5 MG PO CAPS: 12.5000 mg | ORAL_CAPSULE | Freq: Every day | ORAL | Status: DC

## 2012-11-23 MED ORDER — GEMFIBROZIL 600 MG PO TABS: 600.0000 mg | ORAL_TABLET | Freq: Every day | ORAL | Status: DC

## 2012-11-23 MED ORDER — ALPRAZOLAM 0.25 MG PO TABS: 0.2500 mg | ORAL_TABLET | Freq: Three times a day (TID) | ORAL | Status: DC | PRN

## 2012-11-23 MED ORDER — AMLODIPINE BESYLATE 5 MG PO TABS: 5.0000 mg | ORAL_TABLET | Freq: Every day | ORAL | Status: DC

## 2012-11-23 MED ORDER — ATORVASTATIN CALCIUM 20 MG PO TABS: 20.0000 mg | ORAL_TABLET | Freq: Every day | ORAL | Status: DC

## 2012-11-23 MED ORDER — METOPROLOL SUCCINATE ER 50 MG PO TB24: 50.0000 mg | ORAL_TABLET | Freq: Every day | ORAL | Status: DC

## 2012-11-23 NOTE — Patient Instructions (Addendum)
Today we updated your med list in our EPIC system...    Continue your current medications the same...  Today we did your follow up Metabolic panel & Lipid check...    We will contact you w/ the results when available...   We will refer your chart to our GI Dept regarding your follow up colonoscopy due...  Please call [facility] for your overdue GYN check & Mammogram...  Call for any questions...  Let's plan a follow up visit in 49mo, sooner if needed for problems.Marland KitchenMarland Kitchen

## 2012-11-23 NOTE — Progress Notes (Signed)
Subjective:    Patient ID: Emily Cannon, female    DOB: 02/09/1933, 79 y.o.   MRN: [id number]  HPI 79 y/o WF here for a follow up visit... she has multiple medical problems including:  HBP;  Hyperlipidemia;  IBS/ Hems;  LBP;  Osteopenia;  Anxiety...  ~  May 22, 2011:  27mo ROV & she continues to do well- no new complaints or concerns; recent labs reviewed w/ pt & TG are up at 240, and TSH is 5.74 (she agrees to try better diet & start Synthroid63mcg/d)...    HBP>  on ToprolXL50, Norvasc5, HCTZ12.5; BP= 130/80, tol meds well & feeling well;  Denies CP, palpit, dizzy, SOB, edema...    Lipids> Treated w/ Lipitor20 & Lopid600; FLP shows chol ok but TG=240 & prob needs to incr Lopid but she prefers diet & start Synthroid...    Hypothy> TSH has been drifting up and today= 5.74; she notes sl decr energy & she agrees to try Synthroid 65mcg/d...    IBS>  Last colonoscopy was 2004 (neg); she denies recent symptoms of abd pain, N V D C, or blood seen...    Osteop>  She has been off the Boniva ~56yrs and f/u BMD shows normal TScores -0.9 in left fem neck;  Continue calcium, MVI, & incr VitD to 2000u/d (VitD=39).    Anxiety>  On Alprazolam prn...  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       We reviewed prob list, meds, xrays and labs> see below>> LABS 4/13:  FLP- at goals x TG=221 on Lip20+Lpid600;  Chems- wnl;  TSH=3.94 on Synth50.  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    Lipids> Treated w/ Lipitor20 & Lopid600; FLP shows Chol  ok but TG=196 & prob needs to incr Lopid but she prefers diet & exercise...    Hypothy> on Synthroid50 now; TSH= 2.40; she notes improved energy on Rx...    IBS>  Last colonoscopy was 2004 (neg); she denies recent symptoms of abd pain, N V D C, or blood seen...    DJD> known LBP & c/o left hip pain- using OTC analgesics w/ Aleve & Tylenol...    Osteop>  She has been off the Boniva ~65yrs and f/u BMD shows normal TScores -0.9 in left fem neck;  Continue calcium, MVI, & incr VitD to 2000u/d (VitD=39).    Anxiety>  On Alprazolam prn... We reviewed prob list, meds, xrays and labs> see below for updates >> OK Flu shot today... LABS 10/13:  FLP- ok on Lip20+Gemfib600 x TG=196;  Chems- wnl x BS=106;  CBC- wnl;  TSH=2.40   ~  November 23, 2012:  77mo ROV & Emily Cannon is stable- reports sched for cataract surg soon... She called after check by ENT & was told BP elev so we added Apres25Bid & improved (see below)... We reviewed the following medical problems during today's office visit >>  HBP>  on ToprolXL50, Norvasc5, Apres25Bid, HCTZ12.5; BP= 120/64, tol meds well & feeling well;  Denies CP, palpit, dizzy, SOB, edema...    Lipids> Treated w/ Lipitor20 & Lopid600; FLP 4/14 shows TChol 151, TG 162, HDL 52, LDL 66; continue same & low fat diet...    Hypothy> on Synthroid50 now; Labs 10/13 showed TSH= 2.40; she notes improved energy on Rx...    IBS>  Last colonoscopy was 2004 (neg); she denies recent symptoms of abd pain, N V D C, or blood seen...    DJD> known LBP & c/o left hip pain- using OTC analgesics w/ Aleve & Tylenol...    Osteop>  She has been off the Boniva ~48yrs and f/u BMD 4/12 showed normal TScores -0.9 in left fem neck; Continue calcium, MVI, & incr VitD to 2000u/d (VitD=39).    Anxiety>  On Alprazolam0.25 prn... We reviewed prob list, meds, xrays and labs> see below for updates >> she is overdue for GYN check up & she promises to call...          Problem List:     Hx of Perforated TM 1999 (w/ a  paper clip!) - eval by DrKraus & DrDeaton... ~  exam 12/09 showed cerumen impactions and she had this disimpacted by ENT. ~  she continues to follow up w/ DrKraus periodically for cerumen problems & hearing tests.  HYPERTENSION (ICD-401.9) - controlled on ASA 81mg /d, TOPROL XL 50mg /d, AMLODIPINE 5mg /d, & HCT 12.5mg /d... tolerates well, and takes them regularly; denies HA, fatigue, visual changes, CP, palipit, dizziness, syncope, dyspnea, edema, etc... ~  CXR 12/09 showed normal heart size, clear lungs, old healed left clavicular fx... ~  4/11: Cardiac eval DrKatz for presyncopal spell> EKG= NSR, WNL; 2DEcho= norm LV w/ EF= 65%, norm valves, etc... ~  10/12:  BP= 130/80 & she is essentially asymptomatic... ~  4/13:  BP= 158/82 & she is stressed; discussed w/ pt & she will monitor BP at home... ~  10/13:  BP= 126/88 & she denies CP, palpit, SOB, edema... ~  4/14: on ToprolXL50, Norvasc5, Apres25Bid, HCTZ12.5; BP= 120/64, tol meds well & feeling well;  Denies CP, palpit, dizzy, SOB, edema.  HYPERLIPIDEMIA (ICD-272.4) - on LIPITOR 20mg /d & GEMFIBRIZOL 600mg /d; takes these regularly & no side effects...  ~  [place] 12/07 showed TChol 193, TG 160, HDL 52, LDL 109 ~  FLP 12/08 showed TChol 177, TG 171, HDL 50, LDL 92... continue same meds. ~  [place] 12/09 showed TChol 193, TG 149, HDL 55, LDL 108 ~  FLP 12/10 showed TChol 187, TG 190, HDL 55, LDL 94 ~  FLP 10/11 showed TChol 187, TG 217, HDL 50, LDL 107... rec> better low fat diet, same meds. ~  [place] 10/12 showed TChol 187, TG 240, HDL 52, LDL 95... May need incr Lopid but she prefers to try the Synthroid 1st. ~  FLP 4/13 on Lip20+Lopid600 showed TChol 193, TG 221, HDL 58, LDL 102... Needs better low fat diet. ~  FLP 10/13 on Lip20+Lopid600 showed TChol 182, TG 198, HDL 51, LDL 92 ~  FLP 4/14 on  Lip20+Lopid600 showed TChol 151, TG 162, HDL 52, LDL 66   HYPOTHYROID >> on SYNTHROID 39mcg/d... ~  Labs 10/11 showed TSH= 4.27 ~  Labs 10/12 showed TSH= 5.74...  rec to start Synthroid 77mcg/d... ~  Labs 4/13 on Levothy50 showed TSH= 3.94 ~  Labs 10/13 on Levo50 showed TSH= 2.40  IRRITABLE BOWEL SYNDROME (ICD-564.1) - last colon 2/04 by DrPatterson was normal (f/u  rec for 65yrs)... hx of hemorroids and constipation in the past (rec to take Miralax & Senakot-S)... denies nausea, vomiting, heartburn, diarrhea, constipation, blood in stool, abdominal pain, swelling, gas...  LOW BACK PAIN (ICD-724.2) - she has hx of HNP, manages OK w/ exercise (walking) and Tylenol. OSTEOPENIA (ICD-733.90) - she stopped Fosamax due to side effects but tolerated BONIVA (on hold now w/ improved BMD)... also takes ca++, MVI, Vit D 1000 daily. ~  BMD 1/08 showed TScores -0.6 in spine, and -1.4 in fem neck... ~  BMD 12/09 showed TScores +1.0 in Spine & -0.8 in left FemNeck... rec> stop Boniva. ~  12/10:  no bone symptoms- on drug holiday at present... ~  5/11: remains asymptomatic, doing well & exercises by walking. ~  labs 10/11 showed Vit D level = 56 ~  BMD 4/12 showed TScores +0.9 in Spine and -0.9 in left FemNeck... Continue off the Boniva. ~  Labs 10/12 showed Vit D level = 39... rec incr OTC supplement to 2000u daily. ~  10/13:  C/o left hip pain & Rx w/ OTC Aleve, Tylenol...  ANXIETY (ICD-300.00) - uses ALPRAZOLAM 0.25mg  Prn...  HEALTH MAINTENANCE - she sees Holiday representative for GYN- PAP & Mammogram are up-to-date... BMD here 1/08 & 12/09 as noted... colonoscopy 2/04 by DrPatterson w/ rec for f/u in 10 years... she's had her 2011 Flu shot this season, had Pneumovax 6 yrs ago at age 46... received TDAP 5/11, and had the Shingles vaccine 6/11...   Past Surgical History  Procedure Laterality Date  . Abdominal hysterectomy      Outpatient Encounter Prescriptions as of 11/23/2012  Medication Sig Dispense Refill  . ALPRAZolam (XANAX) 0.25 MG tablet Take 1 tablet (0.25 mg total) by mouth 3 (three) times daily as needed for anxiety.  90 tablet  5  . amLODipine (NORVASC) 5 MG  tablet Take 1 tablet (5 mg total) by mouth daily.  30 tablet  11  . aspirin 81 MG tablet Take 81 mg by mouth daily.        Marland Kitchen atorvastatin (LIPITOR) 20 MG tablet Take 1 tablet (20 mg total) by mouth daily.  30 tablet  11  . Calcium Carbonate-Vitamin D (CALCIUM 600+D) 600-400 MG-UNIT per tablet Take 1 tablet by mouth daily.       . Cholecalciferol (VITAMIN D) 1000 UNITS capsule Take 1,000 Units by mouth daily.        . diphenhydramine-acetaminophen (TYLENOL PM) 25-500 MG TABS Take 1 tablet by mouth at bedtime as needed.        Marland Kitchen gemfibrozil (LOPID) 600 MG tablet Take 1 tablet (600 mg total) by mouth daily.  30 tablet  5  . hydrALAZINE (APRESOLINE) 25 MG tablet Take 1 tablet (25 mg total) by mouth 2 (two) times daily.  60 tablet  0  . hydrochlorothiazide (MICROZIDE) 12.5 MG capsule Take 1 capsule (12.5 mg total) by mouth daily.  30 capsule  11  . levothyroxine (SYNTHROID, LEVOTHROID) 50 MCG tablet TAKE ONE TABLET BY MOUTH DAILY.  90 tablet  1  . metoprolol succinate (TOPROL-XL) 50 MG 24 hr tablet Take 1 tablet (50 mg total) by mouth daily.  30 tablet  11  . Multiple Vitamin (MULTIVITAMIN) tablet Take 1 tablet by mouth daily.         No facility-administered encounter medications on file as of 11/23/2012.    Allergies  Allergen Reactions  . Alendronate Sodium     REACTION: pt states "short of breath"  . Amoxicillin  REACTION: causes severe diarrhea  . Codeine     REACTION: pt states nausea  . Valsartan     REACTION: gums/throat swelling    Current Medications, Allergies, Past Medical History, Past Surgical History, Family History, and Social History were reviewed in [employer] record.    Review of Systems        See HPI - all other systems neg except as noted... The patient denies anorexia, fever, weight loss, weight gain, vision loss, decreased hearing, hoarseness, chest pain, syncope, dyspnea on exertion, peripheral edema, prolonged cough, headaches,  hemoptysis, abdominal pain, melena, hematochezia, severe indigestion/heartburn, hematuria, incontinence, muscle weakness, suspicious skin lesions, transient blindness, difficulty walking, depression, unusual weight change, abnormal bleeding, enlarged lymph nodes, and angioedema.     Objective:   Physical Exam     WD, WN, 79 y/o WF in NAD... GENERAL:  Alert & oriented; pleasant & cooperative... HEENT:  /AT, EOM-wnl, PERRLA, Fundi-benign, EACs- dry wax, TMs- not seen, NOSE-clear, THROAT-clear & wnl. NECK:  Supple w/ fairROM; no JVD; normal carotid impulses w/o bruits; no thyromegaly or nodules palpated; no lymphadenopathy. CHEST:  Clear to P & A; without wheezes/ rales/ or rhonchi. HEART:  Regular Rhythm; without murmurs/ rubs/ or gallops. ABDOMEN:  Soft & nontender; normal bowel sounds; no organomegaly or masses detected. EXT: without deformities or arthritic changes; no varicose veins/ venous insuffic/ or edema. NEURO:  CN's intact; motor testing normal; sensory testing normal; gait normal & balance OK. DERM:  No lesions noted; no rash etc...  RADIOLOGY DATA:  Reviewed in the EPIC EMR & discussed w/ the patient...  LABORATORY DATA:  Reviewed in the EPIC EMR & discussed w/ the patient...   Assessment & Plan:    HBP>  Controlled on med w/ Apres added; continue same...     Lipids> Treated w/ [place];  With elev TGs may need to incr the Lopid but she wants to wait & try better low fat diet...     Hypothy>  On Synthroid 36mcg/d now & TSH is wnl...    IBS>  Last colonoscopy was 2004 (neg); she denies recent symptoms of abd pain, N V D C, or blood seen; f/u due 2014...     Osteop>  She has been off the Boniva ~6yrs and f/u BMD shows normal TScores -0.9 in left fem neck;  Continue calcium, MVI, & incr VitD to 2000u/d (VitD=39).     Anxiety>  On Alprazolam prn...   Patient's Medications  New Prescriptions   No medications on file  Previous Medications   ASPIRIN 81 MG  TABLET    Take 81 mg by mouth daily.     CALCIUM CARBONATE-VITAMIN D (CALCIUM 600+D) 600-400 MG-UNIT PER TABLET    Take 1 tablet by mouth daily.    CHOLECALCIFEROL (VITAMIN D) 1000 UNITS CAPSULE    Take 1,000 Units by mouth daily.     DIPHENHYDRAMINE-ACETAMINOPHEN (TYLENOL PM) 25-500 MG TABS    Take 1 tablet by mouth at bedtime as needed.     MULTIPLE VITAMIN (MULTIVITAMIN) TABLET    Take 1 tablet by mouth daily.    Modified Medications   Modified Medication Previous Medication   ALPRAZOLAM (XANAX) 0.25 MG TABLET ALPRAZolam (XANAX) 0.25 MG tablet      Take 1 tablet (0.25 mg total) by mouth 3 (three) times daily as needed for anxiety.    Take 1 tablet (0.25 mg total) by mouth 3 (three) times daily as needed for anxiety.   AMLODIPINE (NORVASC)  5 MG TABLET amLODipine (NORVASC) 5 MG tablet      Take 1 tablet (5 mg total) by mouth daily.    Take 1 tablet (5 mg total) by mouth daily.   ATORVASTATIN (LIPITOR) 20 MG TABLET atorvastatin (LIPITOR) 20 MG tablet      Take 1 tablet (20 mg total) by mouth daily.    Take 1 tablet (20 mg total) by mouth daily.   GEMFIBROZIL (LOPID) 600 MG TABLET gemfibrozil (LOPID) 600 MG tablet      Take 1 tablet (600 mg total) by mouth daily.    Take 1 tablet (600 mg total) by mouth daily.   HYDRALAZINE (APRESOLINE) 25 MG TABLET hydrALAZINE (APRESOLINE) 25 MG tablet      TAKE 1 TABLET (25 MG TOTAL) BY MOUTH 2 (TWO) TIMES DAILY.    Take 1 tablet (25 mg total) by mouth 2 (two) times daily.   HYDROCHLOROTHIAZIDE (MICROZIDE) 12.5 MG CAPSULE hydrochlorothiazide (MICROZIDE) 12.5 MG capsule      Take 1 capsule (12.5 mg total) by mouth daily.    Take 1 capsule (12.5 mg total) by mouth daily.   LEVOTHYROXINE (SYNTHROID, LEVOTHROID) 50 MCG TABLET levothyroxine (SYNTHROID, LEVOTHROID) 50 MCG tablet      TAKE ONE TABLET BY MOUTH DAILY.    TAKE ONE TABLET BY MOUTH DAILY.   LIPITOR 20 MG TABLET atorvastatin (LIPITOR) 20 MG tablet      TAKE ONE TABLET (20 MG TOTAL) BY MOUTH DAILY.    Take  1 tablet (20 mg total) by mouth daily.   METOPROLOL SUCCINATE (TOPROL-XL) 50 MG 24 HR TABLET metoprolol succinate (TOPROL-XL) 50 MG 24 hr tablet      Take 1 tablet (50 mg total) by mouth daily.    Take 1 tablet (50 mg total) by mouth daily.  Discontinued Medications   No medications on file

## 2012-12-08 ENCOUNTER — Other Ambulatory Visit: Payer: Self-pay | Admitting: Pulmonary Disease

## 2012-12-11 ENCOUNTER — Other Ambulatory Visit: Payer: Self-pay | Admitting: Pulmonary Disease

## 2013-01-13 ENCOUNTER — Telehealth: Payer: Self-pay | Admitting: Gastroenterology

## 2013-01-13 NOTE — Telephone Encounter (Signed)
Dr Sharlett Iles, [place] for pt to switch to Dr Olevia Perches; her husband sees Dr Olevia Perches? Thanks.

## 2013-01-13 NOTE — Telephone Encounter (Signed)
yes

## 2013-01-22 ENCOUNTER — Telehealth: Payer: Self-pay | Admitting: Pulmonary Disease

## 2013-01-22 NOTE — Telephone Encounter (Signed)
Spoke with pt She states since taking hydralazine 25 mg 1 bid she has experienced dizziness upon standing She states that her readings have been as low as 100/45  She is also taking toprol 50 mg daily, amlodipine 5 mg daily, and hctz 12.5 mg daily  Please advise recs thanks! Last ov 11/23/12 Next ov 05/26/13

## 2013-01-22 NOTE — Telephone Encounter (Signed)
Spoke with pt and notified of recs per SN. She verbalized understanding and states nothing further needed.  

## 2013-01-22 NOTE — Telephone Encounter (Signed)
Per SN: Hold hydralazine and cont to monitor BP. If BP bad >150 start hydralazine back at 1/2 BID    lmomtcb x1 for pt

## 2013-02-01 NOTE — Telephone Encounter (Signed)
OK db

## 2013-02-01 NOTE — Telephone Encounter (Signed)
Dr Olevia Perches, will you accept this pt; her husband see you? Thanks.

## 2013-02-02 ENCOUNTER — Encounter: Payer: Self-pay | Admitting: Internal Medicine

## 2013-02-02 NOTE — Telephone Encounter (Signed)
Please call pt and schedule an OV for possible COLON. Dr Olevia Perches agreed to accept pt.Thanks

## 2013-02-09 ENCOUNTER — Encounter: Payer: Self-pay | Admitting: Pulmonary Disease

## 2013-03-19 ENCOUNTER — Ambulatory Visit (AMBULATORY_SURGERY_CENTER): Payer: Medicare Other

## 2013-03-19 VITALS — Ht 60.0 in | Wt 136.0 lb

## 2013-03-19 DIAGNOSIS — Z1211 Encounter for screening for malignant neoplasm of colon: Secondary | ICD-10-CM

## 2013-03-19 MED ORDER — MOVIPREP 100 G PO SOLR: 1.0000 | Freq: Once | ORAL | Status: DC

## 2013-03-22 ENCOUNTER — Encounter: Payer: Self-pay | Admitting: Internal Medicine

## 2013-04-02 ENCOUNTER — Encounter: Payer: Self-pay | Admitting: Internal Medicine

## 2013-04-02 ENCOUNTER — Ambulatory Visit (AMBULATORY_SURGERY_CENTER): Payer: Medicare Other | Admitting: Internal Medicine

## 2013-04-02 VITALS — BP 118/65 | HR 55 | Temp 96.2°F | Resp 14 | Ht 61.0 in | Wt 134.0 lb

## 2013-04-02 DIAGNOSIS — Z1211 Encounter for screening for malignant neoplasm of colon: Secondary | ICD-10-CM

## 2013-04-02 MED ORDER — SODIUM CHLORIDE 0.9 % IV SOLN: 500.0000 mL | INTRAVENOUS | Status: DC

## 2013-04-02 NOTE — Patient Instructions (Addendum)
YOU HAD AN ENDOSCOPIC PROCEDURE TODAY AT THE [facility]: Refer to the procedure report that was given to you for any specific questions about what was found during the examination.  If the procedure report does not answer your questions, please call your gastroenterologist to clarify.  If you requested that your care partner not be given the details of your procedure findings, then the procedure report has been included in a sealed envelope for you to review at your convenience later.  YOU SHOULD EXPECT: Some feelings of bloating in the abdomen. Passage of more gas than usual.  Walking can help get rid of the air that was put into your GI tract during the procedure and reduce the bloating. If you had a lower endoscopy (such as a colonoscopy or flexible sigmoidoscopy) you may notice spotting of blood in your stool or on the toilet paper. If you underwent a bowel prep for your procedure, then you may not have a normal bowel movement for a few days.  DIET: Your first meal following the procedure should be a light meal and then it is ok to progress to your normal diet.  A half-sandwich or bowl of soup is an example of a good first meal.  Heavy or fried foods are harder to digest and may make you feel nauseous or bloated.  Likewise meals heavy in dairy and vegetables can cause extra gas to form and this can also increase the bloating.  Drink plenty of fluids but you should avoid alcoholic beverages for 24 hours.  ACTIVITY: Your care partner should take you home directly after the procedure.  You should plan to take it easy, moving slowly for the rest of the day.  You can resume normal activity the day after the procedure however you should NOT DRIVE or use heavy machinery for 24 hours (because of the sedation medicines used during the test).    SYMPTOMS TO REPORT IMMEDIATELY: A gastroenterologist can be reached at any hour.  During normal business hours, 8:30 AM to 5:00 PM Monday through Friday,  call [phone].  After hours and on weekends, please call the GI answering service at [phone] who will take a message and have the physician on call contact you.   Following lower endoscopy (colonoscopy or flexible sigmoidoscopy):  Excessive amounts of blood in the stool  Significant tenderness or worsening of abdominal pains  Swelling of the abdomen that is new, acute  Fever of 100F or higher    FOLLOW UP: If any biopsies were taken you will be contacted by phone or by letter within the next 1-3 weeks.  Call your gastroenterologist if you have not heard about the biopsies in 3 weeks.  Our staff will call the home number listed on your records the next business day following your procedure to check on you and address any questions or concerns that you may have at that time regarding the information given to you following your procedure. This is a courtesy call and so if there is no answer at the home number and we have not heard from you through the emergency physician on call, we will assume that you have returned to your regular daily activities without incident.  SIGNATURES/CONFIDENTIALITY: You and/or your care partner have signed paperwork which will be entered into your electronic medical record.  These signatures attest to the fact that that the information above on your After Visit Summary has been reviewed and is understood.  Full responsibility of the confidentiality   of this discharge information lies with you and/or your care-partner.  Diverticulosis and high fiber diet information given.  No recall due to age. 

## 2013-04-02 NOTE — Op Note (Signed)
[place]  [address] [place], [id number]   COLONOSCOPY PROCEDURE REPORT  PATIENT: Emily Cannon, Emily Cannon  MR#: [id number] BIRTHDATE: 06/16/1933 , 30  yrs. old GENDER: Female ENDOSCOPIST: Lafayette Dragon, MD REFERRED [id number] colonoscopy PROCEDURE DATE:  04/02/2013 PROCEDURE:   Colonoscopy, screening First Screening Colonoscopy - Avg.  risk and is 50 yrs.  old or older - No.  Prior Negative Screening - Now for repeat screening. 10 or more years since last screening  History of Adenoma - Now for follow-up colonoscopy & has been > or = to 3 yrs.  N/A  Polyps Removed Today? No.  Recommend repeat exam, <10 yrs? No. ASA CLASS:   Class II INDICATIONS:Average risk patient for colon cancer and last colonoscopy 08/2000. MEDICATIONS: MAC sedation, administered by CRNA and propofol (Diprivan) 200mg  IV  DESCRIPTION OF PROCEDURE:   After the risks benefits and alternatives of the procedure were thoroughly explained, informed consent was obtained.  A digital rectal exam revealed no abnormalities of the rectum.   The LB [id number]  endoscope was introduced through the anus and advanced to the cecum, which was identified by both the appendix and ileocecal valve. No adverse events experienced.   The quality of the prep was good, using MoviPrep  The instrument was then slowly withdrawn as the colon was fully examined.      COLON FINDINGS: There was mild diverticulosis noted in the sigmoid colon with associated colonic spasm and tortuosity.  Retroflexed views revealed no abnormalities. The time to cecum=7 minutes 26 seconds.  Withdrawal time=6 minutes 57 seconds.  The scope was withdrawn and the procedure completed. COMPLICATIONS: There were no complications.  ENDOSCOPIC IMPRESSION: There was mild diverticulosis noted in the sigmoid colon  RECOMMENDATIONS: 1.  High fiber diet 2.   no recall due to age   eSigned:  Lafayette Dragon, MD 04/02/2013 10:32 AM   cc:  Noralee Space, MD

## 2013-04-02 NOTE — Progress Notes (Signed)
A/ox3 pleased with MAC, report to [facility]

## 2013-04-05 ENCOUNTER — Telehealth: Payer: Self-pay | Admitting: *Deleted

## 2013-04-05 NOTE — Telephone Encounter (Signed)
  Follow up Call-  Call back number 04/02/2013  Post procedure Call Back phone  # [phone]  Permission to leave phone message Yes     Patient questions:  Do you have a fever, pain , or abdominal swelling? no Pain Score  0 *  Have you tolerated food without any problems? yes  Have you been able to return to your normal activities? yes  Do you have any questions about your discharge instructions: Diet   no Medications  no Follow up visit  no  Do you have questions or concerns about your Care? no  Actions: * If pain score is 4 or above: No action needed, pain <4.

## 2013-05-26 ENCOUNTER — Ambulatory Visit (INDEPENDENT_AMBULATORY_CARE_PROVIDER_SITE_OTHER): Payer: Medicare Other | Admitting: Pulmonary Disease

## 2013-05-26 ENCOUNTER — Other Ambulatory Visit (INDEPENDENT_AMBULATORY_CARE_PROVIDER_SITE_OTHER): Payer: Medicare Other

## 2013-05-26 ENCOUNTER — Encounter: Payer: Self-pay | Admitting: Pulmonary Disease

## 2013-05-26 VITALS — BP 140/80 | HR 58 | Temp 97.7°F | Ht 61.0 in | Wt 134.8 lb

## 2013-05-26 DIAGNOSIS — I872 Venous insufficiency (chronic) (peripheral): Secondary | ICD-10-CM

## 2013-05-26 DIAGNOSIS — F411 Generalized anxiety disorder: Secondary | ICD-10-CM

## 2013-05-26 DIAGNOSIS — K589 Irritable bowel syndrome without diarrhea: Secondary | ICD-10-CM

## 2013-05-26 DIAGNOSIS — M545 Low back pain, unspecified: Secondary | ICD-10-CM

## 2013-05-26 DIAGNOSIS — E785 Hyperlipidemia, unspecified: Secondary | ICD-10-CM

## 2013-05-26 DIAGNOSIS — E039 Hypothyroidism, unspecified: Secondary | ICD-10-CM

## 2013-05-26 DIAGNOSIS — I10 Essential (primary) hypertension: Secondary | ICD-10-CM

## 2013-05-26 DIAGNOSIS — M899 Disorder of bone, unspecified: Secondary | ICD-10-CM

## 2013-05-26 LAB — BASIC METABOLIC PANEL
BUN: 14 mg/dL (ref 6–23)
Calcium: 9.8 mg/dL (ref 8.4–10.5)
Creatinine, Ser: 0.7 mg/dL (ref 0.4–1.2)
Glucose, Bld: 106 mg/dL — ABNORMAL HIGH (ref 70–99)
Potassium: 4.4 mEq/L (ref 3.5–5.1)

## 2013-05-26 LAB — LDL CHOLESTEROL, DIRECT: Direct LDL: 97.1 mg/dL

## 2013-05-26 LAB — CBC WITH DIFFERENTIAL/PLATELET
Basophils Relative: 0.4 % (ref 0.0–3.0)
Eosinophils Absolute: 0.1 10*3/uL (ref 0.0–0.7)
Eosinophils Relative: 1.2 % (ref 0.0–5.0)
Hemoglobin: 13.9 g/dL (ref 12.0–15.0)
Lymphocytes Relative: 33.5 % (ref 12.0–46.0)
MCHC: 34 g/dL (ref 30.0–36.0)
MCV: 89.3 fl (ref 78.0–100.0)
Neutro Abs: 3.4 10*3/uL (ref 1.4–7.7)
Neutrophils Relative %: 56.7 % (ref 43.0–77.0)
Platelets: 267 10*3/uL (ref 150.0–400.0)
RBC: 4.57 Mil/uL (ref 3.87–5.11)
RDW: 13.7 % (ref 11.5–14.6)
WBC: 6 10*3/uL (ref 4.5–10.5)

## 2013-05-26 LAB — LIPID PANEL
Cholesterol: 183 mg/dL (ref 0–200)
HDL: 57.5 mg/dL (ref >39.00)
Total CHOL/HDL Ratio: 3
Triglycerides: 212 mg/dL — ABNORMAL HIGH (ref 0.0–149.0)
VLDL: 42.4 mg/dL — ABNORMAL HIGH (ref 0.0–40.0)

## 2013-05-26 LAB — HEPATIC FUNCTION PANEL
ALT: 19 U/L (ref 0–35)
AST: 22 U/L (ref 0–37)
Albumin: 4.7 g/dL (ref 3.5–5.2)
Bilirubin, Direct: 0.1 mg/dL (ref 0.0–0.3)
Total Bilirubin: 0.8 mg/dL (ref 0.3–1.2)
Total Protein: 7.9 g/dL (ref 6.0–8.3)

## 2013-05-26 MED ORDER — ALPRAZOLAM 0.25 MG PO TABS: 0.2500 mg | ORAL_TABLET | Freq: Three times a day (TID) | ORAL | Status: DC | PRN

## 2013-05-26 MED ORDER — LEVOTHYROXINE SODIUM 50 MCG PO TABS: ORAL_TABLET | ORAL | Status: DC

## 2013-05-26 NOTE — Patient Instructions (Signed)
Today we updated your med list in our EPIC system...    Continue your current medications the same...  Today we did your follow up FASTING blood work...    We will contact you w/ the results when available...   We gave you the 2014 Flu vaccine today...  Call for any questions...  Let's plan a follow up visit in 60mo, sooner if needed for problems.Marland KitchenMarland Kitchen

## 2013-05-26 NOTE — Progress Notes (Signed)
Subjective:    Patient ID: Emily Cannon, female    DOB: [birth year], 79 y.o.   MRN: [id number]  HPI 79 y/o WF here for a follow up visit... she has multiple medical problems including:  HBP;  Hyperlipidemia;  IBS/ Hems;  LBP;  Osteopenia;  Anxiety...  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       We reviewed prob list, meds, xrays and labs> see below>>  LABS 4/13:  FLP- at goals x TG=221 on Lip20+Lpid600;  Chems- wnl;  TSH=3.94 on Synth50.  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    Lipids> Treated w/ Lipitor20 & Lopid600; FLP shows Chol ok but TG=196 & prob needs to incr Lopid but she prefers diet & exercise...    Hypothy> on Synthroid50 now; TSH= 2.40; she notes improved energy on Rx...    IBS>  Last colonoscopy was 2004 (neg); she denies recent symptoms of abd pain, N V D C, or blood seen...    DJD> known LBP & c/o left hip pain- using OTC analgesics w/ Aleve & Tylenol...    Osteop>  She has been off the Boniva ~27yrs and f/u BMD shows normal TScores -0.9 in left fem neck;  Continue calcium, MVI, & incr VitD to 2000u/d (VitD=39).    Anxiety>  On Alprazolam prn... We reviewed prob list, meds, xrays and labs> see below for updates >> OK Flu shot today...  LABS 10/13:  FLP- ok on Lip20+Gemfib600 x TG=196;  Chems- wnl x BS=106;  CBC- wnl;  TSH=2.40   ~  November 23, 2012:  105mo ROV & Emily Cannon is stable- reports sched for cataract surg soon... She called after check by ENT & was told BP elev so we added Apres25Bid & improved (see  below)... We reviewed the following medical problems during today's office visit >>     HBP>  on ToprolXL50, Norvasc5, Apres25Bid, HCTZ12.5; BP= 120/64, tol meds well & feeling well;  Denies CP, palpit, dizzy, SOB, edema...    Lipids> Treated w/ Lipitor20 & Lopid600; FLP 4/14 shows TChol 151, TG 162, HDL 52, LDL 66; continue same & low fat diet...    Hypothy> on Synthroid50 now; Labs 10/13 showed TSH= 2.40; she notes improved energy on Rx...    IBS>  Last colonoscopy was 2004 (neg); she denies recent symptoms of abd pain, N V D C, or blood seen...    DJD> known LBP & c/o left hip pain- using OTC analgesics w/ Aleve & Tylenol...    Osteop>  She has been off the Boniva ~12yrs and f/u BMD 4/12 showed normal TScores -0.9 in left fem neck; Continue calcium, MVI, & incr VitD to 2000u/d (VitD=39).    Anxiety>  On Alprazolam0.25 prn... We reviewed prob list, meds, xrays and labs> see below for updates >> she is overdue for GYN check up & she promises to call...  ~  May 26, 2013:  16mo ROV & Emily Cannon reports that she feels she is dropping things, weak grip, notices this ever since her colonoscopy 9/14; exam is unremarkable but I offered her eval by ortho, hand, or neuro- she wants to wait...     BP remains stable on Metop50, Amlod5, Apres25-1/2Bid, and HCT12.5; BP= 140/80 & she remains asymptomatic...    She remains on Lip20 & Lopid600; FLP 10/14 shows TChol 183, TG 212, HDL 58, LDL 97... rec to continue same + low fat diet...    On Synthroid & she remains clinically & biochemically euthyroid; last TSH 10/13 = 2.40...    She takes calcium, MVI, VitD supplement; Vit D level = 41...    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 We reviewed prob list, meds, xrays and labs> see below for updates >> she had neg mammogram 6/14;  OK Flu shot today...  LABS 101/14:  FLP- ok x TG=212;  Chems- wnl;  CBC- wnl;  VitD=41...          Problem List:      Hx of Perforated TM 1999 (w/ a paper clip!) - eval by DrKraus & DrDeaton... ~  exam 12/09 showed cerumen impactions and she had this disimpacted by ENT. ~  she continues to follow up w/ DrKraus periodically for cerumen problems & hearing tests.  HYPERTENSION (ICD-401.9) - controlled on ASA 81mg /d, TOPROL XL 50mg /d, AMLODIPINE 5mg /d, & HCT 12.5mg /d... tolerates well, and takes them regularly; denies HA, fatigue, visual changes, CP, palipit, dizziness, syncope, dyspnea, edema, etc... ~  CXR 12/09 showed normal heart size, clear lungs, old healed left clavicular fx... ~  4/11: Cardiac eval DrKatz for presyncopal spell> EKG= NSR, WNL; 2DEcho= norm LV w/ EF= 65%, norm valves, etc... ~  10/12:  BP= 130/80 & she is essentially asymptomatic... ~  4/13:  BP= 158/82 & she is stressed; discussed w/ pt & she will monitor BP at home... ~  10/13:  BP= 126/88 & she denies CP, palpit, SOB, edema... ~  4/14: on ToprolXL50, Norvasc5, Apres25Bid, HCTZ12.5; BP= 120/64, tol meds well & feeling well;  Denies CP, palpit, dizzy, SOB, edema. ~  10/14: BP remains stable on Metop50, Amlod5, Apres25-1/2Bid, and HCT12.5; BP= 140/80 & she remains asymptomatic.  HYPERLIPIDEMIA (ICD-272.4) - on LIPITOR 20mg /d & GEMFIBRIZOL 600mg /d; takes these regularly & no side effects...  ~  [place] 12/07 showed TChol 193, TG 160, HDL 52, LDL 109 ~  FLP 12/08 showed TChol 177, TG 171, HDL 50, LDL 92... continue same meds. ~  [place] 12/09 showed TChol 193, TG 149, HDL 55, LDL 108 ~  FLP 12/10 showed TChol 187, TG 190, HDL 55, LDL 94 ~  FLP 10/11 showed TChol 187, TG 217, HDL 50, LDL 107... rec> better low fat diet, same meds. ~  [place] 10/12 showed TChol 187, TG 240, HDL 52, LDL 95... May need incr Lopid but she prefers to try the Synthroid 1st. ~  FLP 4/13 on Lip20+Lopid600 showed TChol 193, TG 221, HDL 58, LDL 102... Needs better low fat diet. ~  FLP 10/13 on Lip20+Lopid600 showed TChol 182, TG 198, HDL 51, LDL 92 ~  FLP 4/14 on  Lip20+Lopid600  showed TChol 151, TG 162, HDL 52, LDL 66  ~  FLP 10/14 on Lip20+Lopid600 showed TChol 183, TG 212, HDL 58, LDL 97   HYPOTHYROID >> on SYNTHROID 12mcg/d... ~  Labs 10/11 showed TSH= 4.27 ~  Labs 10/12 showed TSH= 5.74.Marland KitchenMarland Kitchen  rec to start Synthroid 58mcg/d... ~  Labs 4/13 on Levothy50 showed TSH= 3.94 ~  Labs 10/13 on Levo50 showed TSH= 2.40  IRRITABLE BOWEL SYNDROME (ICD-564.1) - last colon 2/04 by DrPatterson was normal (f/u rec for 34yrs)... hx of hemorroids and constipation in the past (rec to take Miralax & Senakot-S)... denies nausea, vomiting, heartburn, diarrhea, constipation, blood in stool, abdominal pain, swelling, gas... ~  Colonoscopy 9/14 by DrDBrodie showed mild divertics in sigmoid w/ spasm & tortuosity, no lesion seen...  LOW BACK PAIN (ICD-724.2) - she has hx of HNP, manages OK w/ exercise (walking) and Tylenol. OSTEOPENIA (ICD-733.90) - she stopped Fosamax due to side effects but tolerated BONIVA (on hold now w/ improved BMD)... also takes ca++, MVI, Vit D 1000 daily. ~  BMD 1/08 showed TScores -0.6 in spine, and -1.4 in fem neck... ~  BMD 12/09 showed TScores +1.0 in Spine & -0.8 in left FemNeck... rec> stop Boniva. ~  12/10:  no bone symptoms- on drug holiday at present... ~  5/11: remains asymptomatic, doing well & exercises by walking. ~  labs 10/11 showed Vit D level = 56 ~  BMD 4/12 showed TScores +0.9 in Spine and -0.9 in left FemNeck... Continue off the Boniva. ~  Labs 10/12 showed Vit D level = 39... rec incr OTC supplement to 2000u daily. ~  10/13:  C/o left hip pain & Rx w/ OTC Aleve, Tylenol...  ANXIETY (ICD-300.00) - uses ALPRAZOLAM 0.25mg  Prn...  HEALTH MAINTENANCE - she sees [place] for GYN- PAP & Mammogram are up-to-date (neg mammogram 6/14)... BMD here 12/09 & 4/12 as noted... colonoscopy 2/04 by DrPatterson w/ rec for f/u in 10 years... she's had her 2011 Flu shot this season, had Pneumovax 6 yrs ago at age 16... received TDAP 5/11, and had the Shingles  vaccine 6/11...   Past Surgical History  Procedure Laterality Date  . Abdominal hysterectomy    . Cataract surgery  5/12 and 12/24/2012    both eyes    Outpatient Encounter Prescriptions as of 05/26/2013  Medication Sig Dispense Refill  . ALPRAZolam (XANAX) 0.25 MG tablet Take 1 tablet (0.25 mg total) by mouth 3 (three) times daily as needed for anxiety.  90 tablet  5  . amLODipine (NORVASC) 5 MG tablet Take 1 tablet (5 mg total) by mouth daily.  30 tablet  11  . aspirin 81 MG tablet Take 81 mg by mouth daily.        Marland Kitchen atorvastatin (LIPITOR) 20 MG tablet Take 1 tablet (20 mg total) by mouth daily.  30 tablet  11  . Calcium Carbonate-Vitamin D (CALCIUM 600+D) 600-400 MG-UNIT per tablet Take 1 tablet by mouth daily.       . Cholecalciferol (VITAMIN D) 1000 UNITS capsule Take 1,000 Units by mouth daily.        . diphenhydramine-acetaminophen (TYLENOL PM) 25-500 MG TABS Take 1 tablet by mouth at bedtime as needed.        Marland Kitchen gemfibrozil (LOPID) 600 MG tablet Take 1 tablet (600 mg total) by mouth daily.  30 tablet  11  . hydrALAZINE (APRESOLINE) 25 MG tablet TAKE 1/2  TABLET (25 MG TOTAL) BY MOUTH 2 (TWO) TIMES DAILY.      . hydrochlorothiazide (MICROZIDE) 12.5 MG capsule Take 1 capsule (12.5 mg total) by mouth daily.  30 capsule  11  . levothyroxine (SYNTHROID, LEVOTHROID) 50 MCG tablet TAKE ONE TABLET BY MOUTH DAILY.  30 tablet  11  . metoprolol succinate (TOPROL-XL) 50 MG 24  hr tablet Take 1 tablet (50 mg total) by mouth daily.  30 tablet  11  . Multiple Vitamin (MULTIVITAMIN) tablet Take 1 tablet by mouth daily.        . [DISCONTINUED] hydrALAZINE (APRESOLINE) 25 MG tablet TAKE 1 TABLET (25 MG TOTAL) BY MOUTH 2 (TWO) TIMES DAILY.  60 tablet  6   No facility-administered encounter medications on file as of 05/26/2013.    Allergies  Allergen Reactions  . Alendronate Sodium     REACTION: pt states "short of breath"  . Amoxicillin     REACTION: causes severe diarrhea  . Codeine      REACTION: pt states nausea  . Valsartan     REACTION: gums/throat swelling    Current Medications, Allergies, Past Medical History, Past Surgical History, Family History, and Social History were reviewed in [employer] record.    Review of Systems        See HPI - all other systems neg except as noted... The patient denies anorexia, fever, weight loss, weight gain, vision loss, decreased hearing, hoarseness, chest pain, syncope, dyspnea on exertion, peripheral edema, prolonged cough, headaches, hemoptysis, abdominal pain, melena, hematochezia, severe indigestion/heartburn, hematuria, incontinence, muscle weakness, suspicious skin lesions, transient blindness, difficulty walking, depression, unusual weight change, abnormal bleeding, enlarged lymph nodes, and angioedema.     Objective:   Physical Exam     WD, WN, 79 y/o WF in NAD... GENERAL:  Alert & oriented; pleasant & cooperative... HEENT:  [place]/AT, EOM-wnl, PERRLA, Fundi-benign, EACs- dry wax, TMs- not seen, NOSE-clear, THROAT-clear & wnl. NECK:  Supple w/ fairROM; no JVD; normal carotid impulses w/o bruits; no thyromegaly or nodules palpated; no lymphadenopathy. CHEST:  Clear to P & A; without wheezes/ rales/ or rhonchi. HEART:  Regular Rhythm; without murmurs/ rubs/ or gallops. ABDOMEN:  Soft & nontender; normal bowel sounds; no organomegaly or masses detected. EXT: without deformities or arthritic changes; no varicose veins/ venous insuffic/ or edema. NEURO:  CN's intact; motor testing normal; sensory testing normal; gait normal & balance OK. DERM:  No lesions noted; no rash etc...  RADIOLOGY DATA:  Reviewed in the EPIC EMR & discussed w/ the patient...  LABORATORY DATA:  Reviewed in the EPIC EMR & discussed w/ the patient...   Assessment & Plan:    HBP>  Controlled on med w/ Apres added; continue same...     Lipids> Treated w/ [place];  With elev TGs may need to incr the Lopid but she  wants to wait & try better low fat diet...     Hypothy>  On Synthroid 25mcg/d now & TSH is wnl...    IBS>  Last colonoscopy was 9/14 w/ divertics, otherw neg...     Osteop>  She has been off the Boniva ~35yrs and f/u BMD shows normal TScores -0.9 in left fem neck;  Continue calcium, MVI, & incr VitD to 2000u/d (VitD=39).     Anxiety>  On Alprazolam prn.Marland KitchenMarland Kitchen

## 2013-05-27 LAB — VITAMIN D 25 HYDROXY (VIT D DEFICIENCY, FRACTURES): Vit D, 25-Hydroxy: 41 ng/mL (ref 30–89)

## 2013-07-08 ENCOUNTER — Telehealth: Payer: Self-pay | Admitting: Pulmonary Disease

## 2013-07-08 NOTE — Telephone Encounter (Signed)
Spoke to pt. States that her BP has been running high - 180/81, pulse was 80. She is currently on 4 BP meds. The last one she was put on was Hydralazine 25mg  tablets 1 BID. That was lowering her BP to much, so SN cut her back to 1/2 tab BID. Wants to know what to do now.  Per SN - pt is to increase back to 1 BID.  Pt is aware to increase her Hydralazine to 1 BID.

## 2013-08-02 ENCOUNTER — Other Ambulatory Visit: Payer: Self-pay | Admitting: Pulmonary Disease

## 2013-10-01 ENCOUNTER — Other Ambulatory Visit: Payer: Self-pay | Admitting: Pulmonary Disease

## 2013-10-01 DIAGNOSIS — I10 Essential (primary) hypertension: Secondary | ICD-10-CM

## 2013-10-01 DIAGNOSIS — E785 Hyperlipidemia, unspecified: Secondary | ICD-10-CM

## 2013-10-15 ENCOUNTER — Telehealth: Payer: Self-pay | Admitting: Pulmonary Disease

## 2013-10-15 NOTE — Telephone Encounter (Signed)
No need for message. °

## 2013-10-20 ENCOUNTER — Other Ambulatory Visit: Payer: Self-pay | Admitting: Pulmonary Disease

## 2013-10-25 ENCOUNTER — Other Ambulatory Visit: Payer: Self-pay | Admitting: Pulmonary Disease

## 2013-10-27 ENCOUNTER — Telehealth: Payer: Self-pay | Admitting: Pulmonary Disease

## 2013-10-27 NOTE — Telephone Encounter (Signed)
Per SN---  Take metoprolol 50 mg  1 po bid for now Schedule appt with SN for next week for recheck thanks

## 2013-10-27 NOTE — Telephone Encounter (Signed)
Per SN---  Metoprolol xl 50 mg daily hctz 12.5 mg daily norvasc 5 mg daily Hydro 25 mg  1/2 bid rec increase the hydralazine 25 mg  To 1 bid rov for recheck in 2 wks please.  thanks

## 2013-10-27 NOTE — Telephone Encounter (Signed)
Called spoke with patient and informed her of SN's recs as stated below.  Pt verbalized her understanding and denied any questions/concerns at this time.  Appt scheduled with SN for 4.8.15 @ 0900 with the understanding that she will have a higher copay and we are unsure what her insurance will cover during the visit.  Pt verbalized her understanding on this as well.  Med list updated.  Pt aware to call back if her BP continues to stay elevated.

## 2013-10-27 NOTE — Telephone Encounter (Signed)
Called spoke with pt. She reports last night BP was 150/63, at 10 AM today it was 194/70 then 11 it was 194/72. Pt is concerned. She takes her BP medications as directed. Please advise SN thanks  Allergies  Allergen Reactions  . Alendronate Sodium     REACTION: pt states "short of breath"  . Amoxicillin     REACTION: causes severe diarrhea  . Codeine     REACTION: pt states nausea  . Valsartan     REACTION: gums/throat swelling      Current Outpatient Prescriptions on File Prior to Visit  Medication Sig Dispense Refill  . ALPRAZolam (XANAX) 0.25 MG tablet Take 1 tablet (0.25 mg total) by mouth 3 (three) times daily as needed for anxiety.  90 tablet  5  . amLODipine (NORVASC) 5 MG tablet Take 1 tablet (5 mg total) by mouth daily.  30 tablet  11  . aspirin 81 MG tablet Take 81 mg by mouth daily.        Marland Kitchen atorvastatin (LIPITOR) 20 MG tablet Take 1 tablet (20 mg total) by mouth daily.  30 tablet  11  . Calcium Carbonate-Vitamin D (CALCIUM 600+D) 600-400 MG-UNIT per tablet Take 1 tablet by mouth daily.       . Cholecalciferol (VITAMIN D) 1000 UNITS capsule Take 1,000 Units by mouth daily.        . diphenhydramine-acetaminophen (TYLENOL PM) 25-500 MG TABS Take 1 tablet by mouth at bedtime as needed.        Marland Kitchen gemfibrozil (LOPID) 600 MG tablet Take 1 tablet (600 mg total) by mouth daily.  30 tablet  11  . hydrALAZINE (APRESOLINE) 25 MG tablet TAKE 1/2  TABLET (25 MG TOTAL) BY MOUTH 2 (TWO) TIMES DAILY.      . hydrochlorothiazide (MICROZIDE) 12.5 MG capsule Take 1 capsule (12.5 mg total) by mouth daily.  30 capsule  11  . hydrochlorothiazide (MICROZIDE) 12.5 MG capsule TAKE ONE CAPSULE (12.5 MG TOTAL) BY MOUTH DAILY.  30 capsule  2  . levothyroxine (SYNTHROID, LEVOTHROID) 50 MCG tablet TAKE ONE TABLET BY MOUTH DAILY.  30 tablet  11  . levothyroxine (SYNTHROID, LEVOTHROID) 50 MCG tablet TAKE ONE TABLET BY MOUTH DAILY.  90 tablet  2  . metoprolol succinate (TOPROL-XL) 50 MG 24 hr tablet Take 1  tablet (50 mg total) by mouth daily.  30 tablet  11  . Multiple Vitamin (MULTIVITAMIN) tablet Take 1 tablet by mouth daily.         No current facility-administered medications on file prior to visit.

## 2013-10-27 NOTE — Telephone Encounter (Signed)
Spoke with pt and clarified her medications and dosage.  Pt states that she is already taking Hydralazine 25mg  one tablet twice daily and has been doing this for some time.  Please advise.

## 2013-10-29 ENCOUNTER — Encounter (HOSPITAL_COMMUNITY): Payer: Self-pay | Admitting: Emergency Medicine

## 2013-10-29 ENCOUNTER — Emergency Department (HOSPITAL_COMMUNITY)
Admission: EM | Admit: 2013-10-29 | Discharge: 2013-10-29 | Disposition: A | Discharge status: Home / Self Care | Payer: Medicare Other | Attending: Emergency Medicine | Admitting: Emergency Medicine

## 2013-10-29 DIAGNOSIS — Z880 Allergy status to penicillin: Secondary | ICD-10-CM | POA: Insufficient documentation

## 2013-10-29 DIAGNOSIS — Z8739 Personal history of other diseases of the musculoskeletal system and connective tissue: Secondary | ICD-10-CM | POA: Insufficient documentation

## 2013-10-29 DIAGNOSIS — Z79899 Other long term (current) drug therapy: Secondary | ICD-10-CM | POA: Insufficient documentation

## 2013-10-29 DIAGNOSIS — Z9079 Acquired absence of other genital organ(s): Secondary | ICD-10-CM | POA: Insufficient documentation

## 2013-10-29 DIAGNOSIS — Z8719 Personal history of other diseases of the digestive system: Secondary | ICD-10-CM | POA: Insufficient documentation

## 2013-10-29 DIAGNOSIS — Z8669 Personal history of other diseases of the nervous system and sense organs: Secondary | ICD-10-CM | POA: Insufficient documentation

## 2013-10-29 DIAGNOSIS — F411 Generalized anxiety disorder: Secondary | ICD-10-CM | POA: Insufficient documentation

## 2013-10-29 DIAGNOSIS — Z7982 Long term (current) use of aspirin: Secondary | ICD-10-CM | POA: Insufficient documentation

## 2013-10-29 DIAGNOSIS — I10 Essential (primary) hypertension: Secondary | ICD-10-CM | POA: Insufficient documentation

## 2013-10-29 DIAGNOSIS — E785 Hyperlipidemia, unspecified: Secondary | ICD-10-CM | POA: Insufficient documentation

## 2013-10-29 LAB — I-STAT TROPONIN, ED: TROPONIN I, POC: 0 ng/mL (ref 0.00–0.08)

## 2013-10-29 LAB — I-STAT CHEM 8, ED
BUN: 18 mg/dL (ref 6–23)
CREATININE: 0.9 mg/dL (ref 0.50–1.10)
Calcium, Ion: 1.13 mmol/L (ref 1.13–1.30)
Chloride: 102 mEq/L (ref 96–112)
Glucose, Bld: 107 mg/dL — ABNORMAL HIGH (ref 70–99)
HCT: 40 % (ref 36.0–46.0)
HEMOGLOBIN: 13.6 g/dL (ref 12.0–15.0)
POTASSIUM: 3.4 meq/L — AB (ref 3.7–5.3)
SODIUM: 139 meq/L (ref 137–147)
TCO2: 25 mmol/L (ref 0–100)

## 2013-10-29 NOTE — ED Notes (Signed)
Pt reports hx of HTN, has taken all medications today. Has checked BP twice today and both times has been elevated. Reports slight headache 4/10, usually gets headache when blood pressure is high.

## 2013-10-29 NOTE — ED Provider Notes (Signed)
CSN: [id number]     Arrival date & time 10/29/13  1726 History   First MD Initiated Contact with Patient 10/29/13 1743     Chief Complaint  Patient presents with  . Hypertension     (Consider location/radiation/quality/duration/timing/severity/associated sxs/prior Treatment) HPI Comments: Pt is a 80 y.o. female with Pmhx as above who presents with labile HTN. BP has been elevated, for months, worse than 2-3 weeks. She just had her metoprolol doubled about 1 week ago & had her hydralazine double 2 days ago.  She has had occasional dull headache and occasional dizziness.   She is asymptomatic currently, denies h/a, acute visual changes, N/v, dec UOP, CP, SOB, leg pain or swelling.  Patient is a 80 y.o. female presenting with hypertension.  Hypertension Pertinent negatives include no chest pain, no abdominal pain, no headaches and no shortness of breath.    Past Medical History  Diagnosis Date  . Allergic rhinitis, cause unspecified   . Perforation of tympanic membrane, unspecified   . Unspecified essential hypertension   . Other and unspecified hyperlipidemia   . Irritable bowel syndrome   . Acquired absence of organ, genital organs   . Lumbago   . Disorder of bone and cartilage, unspecified   . Anxiety state, unspecified    Past Surgical History  Procedure Laterality Date  . Abdominal hysterectomy    . Cataract surgery  5/12 and 12/24/2012    both eyes   Family History  Problem Relation Age of Onset  . Lung cancer Mother 9  . Lung disease Father 37  . COPD Other     sibling  . Colon cancer Neg Hx    History  Substance Use Topics  . Smoking status: Never Smoker   . Smokeless tobacco: Never Used  . Alcohol Use: No   OB History   Grav Para Term Preterm Abortions TAB SAB Ect Mult Living                 Review of Systems  Constitutional: Negative for fever, chills, diaphoresis, activity change, appetite change and fatigue.  HENT: Negative for congestion, facial  swelling, rhinorrhea and sore throat.   Eyes: Negative for photophobia and discharge.  Respiratory: Negative for cough, chest tightness and shortness of breath.   Cardiovascular: Negative for chest pain, palpitations and leg swelling.  Gastrointestinal: Negative for nausea, vomiting, abdominal pain and diarrhea.  Endocrine: Negative for polydipsia and polyuria.  Genitourinary: Negative for dysuria, frequency, difficulty urinating and pelvic pain.  Musculoskeletal: Negative for arthralgias, back pain, neck pain and neck stiffness.  Skin: Negative for color change and wound.  Allergic/Immunologic: Negative for immunocompromised state.  Neurological: Negative for facial asymmetry, weakness, numbness and headaches.  Hematological: Does not bruise/bleed easily.  Psychiatric/Behavioral: Negative for confusion and agitation.      Allergies  Alendronate sodium; Amoxicillin; Codeine; and Valsartan  Home Medications   Current Outpatient Rx  Name  Route  Sig  Dispense  Refill  . ALPRAZolam (XANAX) 0.25 MG tablet   Oral   Take 1 tablet (0.25 mg total) by mouth 3 (three) times daily as needed for anxiety.   90 tablet   5   . amLODipine (NORVASC) 5 MG tablet   Oral   Take 1 tablet (5 mg total) by mouth daily.   30 tablet   11   . aspirin 81 MG tablet   Oral   Take 81 mg by mouth every evening.          Marland Kitchen  atorvastatin (LIPITOR) 20 MG tablet   Oral   Take 1 tablet (20 mg total) by mouth daily.   30 tablet   11   . diphenhydramine-acetaminophen (TYLENOL PM) 25-500 MG TABS   Oral   Take 1 tablet by mouth at bedtime as needed.           Marland Kitchen gemfibrozil (LOPID) 600 MG tablet   Oral   Take 1 tablet (600 mg total) by mouth daily.   30 tablet   11   . hydrALAZINE (APRESOLINE) 25 MG tablet   Oral   Take 25 mg by mouth 2 (two) times daily.          . hydrochlorothiazide (MICROZIDE) 12.5 MG capsule   Oral   Take 1 capsule (12.5 mg total) by mouth daily.   30 capsule   11    . levothyroxine (SYNTHROID, LEVOTHROID) 50 MCG tablet   Oral   Take 50 mcg by mouth daily before breakfast.         . metoprolol succinate (TOPROL-XL) 50 MG 24 hr tablet   Oral   Take 50 mg by mouth 2 (two) times daily.          BP 150/68  Pulse 74  Temp(Src) 98.1 F (36.7 C) (Oral)  Resp 19  SpO2 97% Physical Exam  Constitutional: She is oriented to person, place, and time. She appears well-developed and well-nourished. No distress.  HENT:  Head: Normocephalic and atraumatic.  Mouth/Throat: No oropharyngeal exudate.  Eyes: Pupils are equal, round, and reactive to light.  Neck: Normal range of motion. Neck supple.  Cardiovascular: Normal rate, regular rhythm and normal heart sounds.  Exam reveals no gallop and no friction rub.   No murmur heard. Pulmonary/Chest: Effort normal and breath sounds normal. No respiratory distress. She has no wheezes. She has no rales.  Abdominal: Soft. Bowel sounds are normal. She exhibits no distension and no mass. There is no tenderness. There is no rebound and no guarding.  Musculoskeletal: Normal range of motion. She exhibits no edema and no tenderness.  Neurological: She is alert and oriented to person, place, and time. She has normal strength. She displays no tremor. No cranial nerve deficit or sensory deficit. She exhibits normal muscle tone. She displays a negative Romberg sign. Coordination and gait normal. GCS eye subscore is 4. GCS verbal subscore is 5. GCS motor subscore is 6.  Skin: Skin is warm and dry.  Psychiatric: She has a normal mood and affect.    ED Course  Procedures (including critical care time) Labs Review Labs Reviewed  I-STAT CHEM 8, ED - Abnormal; Notable for the following:    Potassium 3.4 (*)    Glucose, Bld 107 (*)    All other components within normal limits  I-STAT TROPOININ, ED   Imaging Review No results found.   EKG Interpretation   Date/Time:  Friday October 29 2013 19:29:53 EDT Ventricular Rate:   66 PR Interval:  184 QRS Duration: 97 QT Interval:  439 QTC Calculation: 460 R Axis:   54 Text Interpretation:  Sinus arrhythmia Minimal ST depression, inferior  leads with similar morphology from EKG done Jul 31, 1997 Confirmed by  [place] [phone]) on 10/29/2013 7:49:13 PM      MDM   Final diagnoses:  Hypertension    Pt is a 80 y.o. female with Pmhx as above who presents with labile HTN. BP in the room during my exam 150's/80's. She has had occasional dull headache  and occasional dizziness.   She is asymptomatic currently, denies h/a, acute visual changes, N/v, dec UOP, CP, SOB, leg pain or swelling. Cardiopulm, abdominal, and neuro exam benign. EKG with slight ST depression in inferior leads with similar morphology as EKG from '99. No reciprocal changes, trop nml. Cr 0.9.  As she has had changes to her meds as recently as 3 days ago, I will not make further changes tonight. I have suggested she continue to keep a log of BP, work on reducing stress. Return precautions given for new or worsening symptoms including CP, SOB, h/a, neuro complaints.         Neta Ehlers, MD 10/29/13 2356

## 2013-10-29 NOTE — Discharge Instructions (Signed)
Arterial Hypertension °Arterial hypertension (high blood pressure) is a condition of elevated pressure in your blood vessels. Hypertension over a long period of time is a risk factor for strokes, heart attacks, and heart failure. It is also the leading cause of kidney (renal) failure.  °CAUSES  °· In Adults -- Over 90% of all hypertension has no known cause. This is called essential or primary hypertension. In the other 10% of people with hypertension, the increase in blood pressure is caused by another disorder. This is called secondary hypertension. Important causes of secondary hypertension are: °· Heavy alcohol use. °· Obstructive sleep apnea. °· Hyperaldosterosim (Conn's syndrome). °· Steroid use. °· Chronic kidney failure. °· Hyperparathyroidism. °· Medications. °· Renal artery stenosis. °· Pheochromocytoma. °· Cushing's disease. °· Coarctation of the aorta. °· Scleroderma renal crisis. °· Licorice (in excessive amounts). °· Drugs (cocaine, methamphetamine). °Your caregiver can explain any items above that apply to you. °· In Children -- Secondary hypertension is more common and should always be considered. °· Pregnancy -- Few women of childbearing age have high blood pressure. However, up to 10% of them develop hypertension of pregnancy. Generally, this will not harm the woman. It may be a sign of 3 complications of pregnancy: preeclampsia, HELLP syndrome, and eclampsia. Follow up and control with medication is necessary. °SYMPTOMS  °· This condition normally does not produce any noticeable symptoms. It is usually found during a routine exam. °· Malignant hypertension is a late problem of high blood pressure. It may have the following symptoms: °· Headaches. °· Blurred vision. °· End-organ damage (this means your kidneys, heart, lungs, and other organs are being damaged). °· Stressful situations can increase the blood pressure. If a person with normal blood pressure has their blood pressure go up while being  seen by their caregiver, this is often termed "white coat hypertension." Its importance is not known. It may be related with eventually developing hypertension or complications of hypertension. °· Hypertension is often confused with mental tension, stress, and anxiety. °DIAGNOSIS  °The diagnosis is made by 3 separate blood pressure measurements. They are taken at least 1 week apart from each other. If there is organ damage from hypertension, the diagnosis may be made without repeat measurements. °Hypertension is usually identified by having blood pressure readings: °· Above 140/90 mmHg measured in both arms, at 3 separate times, over a couple weeks. °· Over 130/80 mmHg should be considered a risk factor and may require treatment in patients with diabetes. °Blood pressure readings over 120/80 mmHg are called "pre-hypertension" even in non-diabetic patients. °To get a true blood pressure measurement, use the following guidelines. Be aware of the factors that can alter blood pressure readings. °· Take measurements at least 1 hour after caffeine. °· Take measurements 30 minutes after smoking and without any stress. This is another reason to quit smoking  it raises your blood pressure. °· Use a proper cuff size. Ask your caregiver if you are not sure about your cuff size. °· Most home blood pressure cuffs are automatic. They will measure systolic and diastolic pressures. The systolic pressure is the pressure reading at the start of sounds. Diastolic pressure is the pressure at which the sounds disappear. If you are elderly, measure pressures in multiple postures. Try sitting, lying or standing. °· Sit at rest for a minimum of 5 minutes before taking measurements. °· You should not be on any medications like decongestants. These are found in many cold medications. °· Record your blood pressure readings and review   them with your caregiver. °If you have hypertension: °· Your caregiver may do tests to be sure you do not have  secondary hypertension (see "causes" above). °· Your caregiver may also look for signs of metabolic syndrome. This is also called Syndrome X or Insulin Resistance Syndrome. You may have this syndrome if you have type 2 diabetes, abdominal obesity, and abnormal blood lipids in addition to hypertension. °· Your caregiver will take your medical and family history and perform a physical exam. °· Diagnostic tests may include blood tests (for glucose, cholesterol, potassium, and kidney function), a urinalysis, or an EKG. Other tests may also be necessary depending on your condition. °PREVENTION  °There are important lifestyle issues that you can adopt to reduce your chance of developing hypertension: °· Maintain a normal weight. °· Limit the amount of salt (sodium) in your diet. °· Exercise often. °· Limit alcohol intake. °· Get enough potassium in your diet. Discuss specific advice with your caregiver. °· Follow a DASH diet (dietary approaches to stop hypertension). This diet is rich in fruits, vegetables, and low-fat dairy products, and avoids certain fats. °PROGNOSIS  °Essential hypertension cannot be cured. Lifestyle changes and medical treatment can lower blood pressure and reduce complications. The prognosis of secondary hypertension depends on the underlying cause. Many people whose hypertension is controlled with medicine or lifestyle changes can live a normal, healthy life.  °RISKS AND COMPLICATIONS  °While high blood pressure alone is not an illness, it often requires treatment due to its short- and long-term effects on many organs. Hypertension increases your risk for: °· CVAs or strokes (cerebrovascular accident). °· Heart failure due to chronically high blood pressure (hypertensive cardiomyopathy). °· Heart attack (myocardial infarction). °· Damage to the retina (hypertensive retinopathy). °· Kidney failure (hypertensive nephropathy). °Your caregiver can explain list items above that apply to you. Treatment  of hypertension can significantly reduce the risk of complications. °TREATMENT  °· For overweight patients, weight loss and regular exercise are recommended. Physical fitness lowers blood pressure. °· Mild hypertension is usually treated with diet and exercise. A diet rich in fruits and vegetables, fat-free dairy products, and foods low in fat and salt (sodium) can help lower blood pressure. Decreasing salt intake decreases blood pressure in a 1/3 of people. °· Stop smoking if you are a smoker. °The steps above are highly effective in reducing blood pressure. While these actions are easy to suggest, they are difficult to achieve. Most patients with moderate or severe hypertension end up requiring medications to bring their blood pressure down to a normal level. There are several classes of medications for treatment. Blood pressure pills (antihypertensives) will lower blood pressure by their different actions. Lowering the blood pressure by 10 mmHg may decrease the risk of complications by as much as 25%. °The goal of treatment is effective blood pressure control. This will reduce your risk for complications. Your caregiver will help you determine the best treatment for you according to your lifestyle. What is excellent treatment for one person, may not be for you. °HOME CARE INSTRUCTIONS  °· Do not smoke. °· Follow the lifestyle changes outlined in the "Prevention" section. °· If you are on medications, follow the directions carefully. Blood pressure medications must be taken as prescribed. Skipping doses reduces their benefit. It also puts you at risk for problems. °· Follow up with your caregiver, as directed. °· If you are asked to monitor your blood pressure at home, follow the guidelines in the "Diagnosis" section above. °SEEK MEDICAL CARE   IF:   You think you are having medication side effects.  You have recurrent headaches or lightheadedness.  You have swelling in your ankles.  You have trouble with  your vision. SEEK IMMEDIATE MEDICAL CARE IF:   You have sudden onset of chest pain or pressure, difficulty breathing, or other symptoms of a heart attack.  You have a severe headache.  You have symptoms of a stroke (such as sudden weakness, difficulty speaking, difficulty walking). MAKE SURE YOU:   Understand these instructions.  Will watch your condition.  Will get help right away if you are not doing well or get worse. Document Released: 07/15/2005 Document Revised: 10/07/2011 Document Reviewed: 02/12/2007 [facility] Patient Information 2014 [place]. Stress Management Stress is a state of physical or mental tension that often results from changes in your life or normal routine. Some common causes of stress are:  Death of a loved one.  Injuries or severe illnesses.  Getting fired or changing jobs.  Moving into a new home. Other causes may be:  Sexual problems.  Business or financial losses.  Taking on a large debt.  Regular conflict with someone at home or at work.  Constant tiredness from lack of sleep. It is not just bad things that are stressful. It may be stressful to:  Win the lottery.  Get married.  Buy a new car. The amount of stress that can be easily tolerated varies from person to person. Changes generally cause stress, regardless of the types of change. Too much stress can affect your health. It may lead to physical or emotional problems. Too little stress (boredom) may also become stressful. SUGGESTIONS TO REDUCE STRESS:  Talk things over with your family and friends. It often is helpful to share your concerns and worries. If you feel your problem is serious, you may want to get help from a professional counselor.  Consider your problems one at a time instead of lumping them all together. Trying to take care of everything at once may seem impossible. List all the things you need to do and then start with the most important one. Set a goal to  accomplish 2 or 3 things each day. If you expect to do too many in a single day you will naturally fail, causing you to feel even more stressed.  Do not use alcohol or drugs to relieve stress. Although you may feel better for a short time, they do not remove the problems that caused the stress. They can also be habit forming.  Exercise regularly - at least 3 times per week. Physical exercise can help to relieve that "uptight" feeling and will relax you.  The shortest distance between despair and hope is often a good night's sleep.  Go to bed and get up on time allowing yourself time for appointments without being rushed.  Take a short "time-out" period from any stressful situation that occurs during the day. Close your eyes and take some deep breaths. Starting with the muscles in your face, tense them, hold it for a few seconds, then relax. Repeat this with the muscles in your neck, shoulders, hand, stomach, back and legs.  Take good care of yourself. Eat a balanced diet and get plenty of rest.  Schedule time for having fun. Take a break from your daily routine to relax. HOME CARE INSTRUCTIONS   Call if you feel overwhelmed by your problems and feel you can no longer manage them on your own.  Return immediately if you feel like  hurting yourself or someone else. Document Released: 01/08/2001 Document Revised: 10/07/2011 Document Reviewed: 03/09/2013 [facility] Patient Information 2014 [place], [place].

## 2013-10-29 NOTE — ED Notes (Signed)
Pt reports intermittent hypertension x "a while."  Sts she has an appointment w/ PCP next week.  Pt has been taking medications as prescribed.

## 2013-11-03 ENCOUNTER — Encounter: Payer: Self-pay | Admitting: Pulmonary Disease

## 2013-11-03 ENCOUNTER — Other Ambulatory Visit: Payer: Self-pay | Admitting: Pulmonary Disease

## 2013-11-03 ENCOUNTER — Ambulatory Visit: Payer: Medicare Other | Admitting: Pulmonary Disease

## 2013-11-03 ENCOUNTER — Ambulatory Visit (INDEPENDENT_AMBULATORY_CARE_PROVIDER_SITE_OTHER): Payer: Medicare Other | Admitting: Pulmonary Disease

## 2013-11-03 VITALS — BP 142/74 | HR 58 | Temp 97.8°F | Ht 61.0 in | Wt 136.2 lb

## 2013-11-03 DIAGNOSIS — F411 Generalized anxiety disorder: Secondary | ICD-10-CM

## 2013-11-03 DIAGNOSIS — E039 Hypothyroidism, unspecified: Secondary | ICD-10-CM

## 2013-11-03 DIAGNOSIS — M545 Low back pain, unspecified: Secondary | ICD-10-CM

## 2013-11-03 DIAGNOSIS — E785 Hyperlipidemia, unspecified: Secondary | ICD-10-CM

## 2013-11-03 DIAGNOSIS — I872 Venous insufficiency (chronic) (peripheral): Secondary | ICD-10-CM

## 2013-11-03 DIAGNOSIS — I10 Essential (primary) hypertension: Secondary | ICD-10-CM

## 2013-11-03 DIAGNOSIS — M949 Disorder of cartilage, unspecified: Secondary | ICD-10-CM

## 2013-11-03 DIAGNOSIS — M899 Disorder of bone, unspecified: Secondary | ICD-10-CM

## 2013-11-03 DIAGNOSIS — K589 Irritable bowel syndrome without diarrhea: Secondary | ICD-10-CM

## 2013-11-03 MED ORDER — GEMFIBROZIL 600 MG PO TABS: 600.0000 mg | ORAL_TABLET | Freq: Every day | ORAL | Status: DC

## 2013-11-03 MED ORDER — METOPROLOL SUCCINATE ER 50 MG PO TB24: 50.0000 mg | ORAL_TABLET | Freq: Two times a day (BID) | ORAL | Status: DC

## 2013-11-03 MED ORDER — LEVOTHYROXINE SODIUM 50 MCG PO TABS: 50.0000 ug | ORAL_TABLET | Freq: Every day | ORAL | Status: DC

## 2013-11-03 MED ORDER — ALPRAZOLAM 0.25 MG PO TABS: 0.2500 mg | ORAL_TABLET | Freq: Three times a day (TID) | ORAL | Status: DC | PRN

## 2013-11-03 MED ORDER — HYDROCHLOROTHIAZIDE 12.5 MG PO CAPS: 12.5000 mg | ORAL_CAPSULE | Freq: Every day | ORAL | Status: DC

## 2013-11-03 MED ORDER — ATORVASTATIN CALCIUM 20 MG PO TABS: 20.0000 mg | ORAL_TABLET | Freq: Every day | ORAL | Status: DC

## 2013-11-03 MED ORDER — AMLODIPINE BESYLATE 5 MG PO TABS
5.0000 mg | ORAL_TABLET | Freq: Every day | ORAL | Status: DC
Start: 2013-11-03 — End: 2013-11-22

## 2013-11-03 NOTE — Patient Instructions (Signed)
Today we updated your med list in our EPIC system...    Continue your current medications the same...  Your BP today is 140/70 range  (despite the white coat) on your current meds...    Work on [employer] & we will refer you to DrGutterman's counseling dept for help w/ this...    In the meanwhile increase the Alprazolam (low dose) to one tab three times daily...  Keep up the good work w/ low sodium diet & your exercise program...  Call for any questions or if we can be of service in any way.Marland KitchenMarland Kitchen

## 2013-11-08 NOTE — Progress Notes (Signed)
Subjective:    Patient ID: Emily Cannon, female    DOB: 1933/07/21, 80 y.o.   MRN: [id number]  HPI 80 y/o WF here for a follow up visit... she has multiple medical problems including:  HBP;  Hyperlipidemia;  IBS/ Hems;  LBP;  Osteopenia;  Anxiety...  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    Lipids> Treated w/ Lipitor20 & Lopid600; FLP shows Chol ok but TG=196 & prob needs to incr Lopid but she prefers diet & exercise...    Hypothy> on Synthroid50 now; TSH= 2.40; she notes improved energy on Rx...    IBS>  Last colonoscopy was 2004 (neg); she denies recent symptoms of abd pain, N V D C, or blood seen...    DJD> known LBP & c/o left hip pain- using OTC analgesics w/ Aleve & Tylenol...    Osteop>  She has been off the Boniva ~39yrs and f/u BMD shows normal TScores -0.9 in left fem neck;  Continue calcium, MVI, & incr VitD to 2000u/d (VitD=39).    Anxiety>  On Alprazolam prn... We reviewed prob list, meds, xrays and labs> see below for updates >> OK Flu shot today...  LABS 10/13:  FLP- ok on Lip20+Gemfib600 x TG=196;  Chems- wnl x BS=106;  CBC- wnl;  TSH=2.40   ~  November 23, 2012:  43mo ROV & Emily Cannon is stable- reports sched for cataract surg soon... She called after check by ENT & was told BP elev so we added Apres25Bid & improved (see below)... We reviewed the following medical problems during today's office visit >>     HBP>  on ToprolXL50, Norvasc5, Apres25Bid, HCTZ12.5; BP= 120/64, tol meds well & feeling well;  Denies CP, palpit, dizzy, SOB, edema...    Lipids> Treated w/ Lipitor20 & Lopid600; FLP 4/14 shows TChol 151, TG 162, HDL 52, LDL 66; continue same & low fat diet...    Hypothy> on Synthroid50 now; Labs 10/13 showed TSH= 2.40; she notes improved energy on Rx...    IBS>  Last  colonoscopy was 2004 (neg); she denies recent symptoms of abd pain, N V D C, or blood seen...    DJD> known LBP & c/o left hip pain- using OTC analgesics w/ Aleve & Tylenol...    Osteop>  She has been off the Boniva ~50yrs and f/u BMD 4/12 showed normal TScores -0.9 in left fem neck; Continue calcium, MVI, & incr VitD to 2000u/d (VitD=39).    Anxiety>  On Alprazolam0.25 prn... We reviewed prob list, meds, xrays and labs> see below for updates >> she is overdue for GYN check up & she promises to call...  ~  May 26, 2013:  80mo ROV & Emily Cannon reports that she feels she is dropping things, weak grip, notices this ever since her colonoscopy 9/14; exam is unremarkable but I offered her eval by ortho, hand, or neuro- she wants to wait...     BP remains stable on Metop50, Amlod5, Apres25-1/2Bid, and HCT12.5; BP= 140/80 & she remains asymptomatic...    She remains on Lip20 & Lopid600; FLP 10/14 shows TChol 183, TG 212, HDL 58, LDL 97... rec to continue same + low fat diet...    On Synthroid & she remains clinically & biochemically euthyroid; last TSH  10/13 = 2.40...    She takes calcium, MVI, VitD supplement; Vit D level = 41...    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 We reviewed prob list, meds, xrays and labs> see below for updates >> she had neg mammogram 6/14;  OK Flu shot today...  LABS 101/14:  FLP- ok x TG=212;  Chems- wnl;  CBC- wnl;  VitD=41...  ~  November 03, 2013:  80mo ROV & Emily Cannon has had some recent issues w/ her BP- suspect a lot is stress related but she has increased her BP meds (anxious about her BP as well)... We reviewed the following medical problems during today's office visit >>     HBP>  on ToprolXL50Bid, Norvasc5, Apres25Bid, HCTZ12.5; BP= 142/74, tol meds well & feeling OK; denies CP, palpit, dizzy, SOB, edema...    Lipids> Treated w/ Lipitor20 & Lopid600; FLP 10/14 showed TChol 183, TG 212, HDL 58, LDL 97;  continue same & low fat diet...    Hypothy> on Synthroid50; Labs 10/13 showed TSH= 2.40; she notes improved energy on Rx...    IBS>  She had colonoscopy 9/14 by DrDBrodie- divertics in sigmoid w/ some spasm & tortuosity, no lesions; she denies recent symptoms of abd pain, N V D C, or blood seen...    DJD> known LBP & c/o left hip pain- using OTC analgesics w/ Aleve & Tylenol...    Osteop>  She has been off the Boniva ~2-42yrs and last BMD 4/12 showed normal TScores -0.9 in left fem neck; Continue calcium, MVI, & incr VitD to 2000u/d (VitD=41).    Anxiety>  On Alprazolam0.25 prn... We reviewed prob list, meds, xrays and labs> see below for updates >>           Problem List:     Hx of Perforated TM 1999 (w/ a paper clip!) - eval by DrKraus & DrDeaton... ~  exam 12/09 showed cerumen impactions and she had this disimpacted by ENT. ~  she continues to follow up w/ DrKraus periodically for cerumen problems & hearing tests.  HYPERTENSION (ICD-401.9) - controlled on ASA 81mg /d, TOPROL XL 50mg /d, AMLODIPINE 5mg /d, & HCT 12.5mg /d... tolerates well, and takes them regularly; denies HA, fatigue, visual changes, CP, palipit, dizziness, syncope, dyspnea, edema, etc... ~  CXR 12/09 showed normal heart size, clear lungs, old healed left clavicular fx... ~  4/11: Cardiac eval DrKatz for presyncopal spell> EKG= NSR, WNL; 2DEcho= norm LV w/ EF= 65%, norm valves, etc... ~  10/12:  BP= 130/80 & she is essentially asymptomatic... ~  4/13:  BP= 158/82 & she is stressed; discussed w/ pt & she will monitor BP at home... ~  10/13:  BP= 126/88 & she denies CP, palpit, SOB, edema... ~  4/14: on ToprolXL50, Norvasc5, Apres25Bid, HCTZ12.5; BP= 120/64, tol meds well & feeling well;  Denies CP, palpit, dizzy, SOB, edema. ~  10/14: BP remains stable on Metop50, Amlod5, Apres25-1/2Bid, and HCT12.5; BP= 140/80 & she remains asymptomatic. ~  4/14: on ToprolXL50Bid, Norvasc5, Apres25Bid, HCTZ12.5; BP= 142/74, tol meds well &  feeling OK; denies CP, palpit, dizzy, SOB, edema.  HYPERLIPIDEMIA (ICD-272.4) - on LIPITOR 20mg /d & GEMFIBRIZOL 600mg /d; takes these regularly & no side effects...  ~  [place] 12/07 showed TChol 193, TG 160, HDL 52, LDL 109 ~  FLP 12/08 showed TChol 177, TG 171, HDL 50, LDL 92... continue same meds. ~  [place] 12/09 showed TChol 193, TG 149, HDL 55,  LDL 108 ~  FLP 12/10 showed TChol 187, TG 190, HDL 55, LDL 94 ~  FLP 10/11 showed TChol 187, TG 217, HDL 50, LDL 107... rec> better low fat diet, same meds. ~  [place] 10/12 showed TChol 187, TG 240, HDL 52, LDL 95... May need incr Lopid but she prefers to try the Synthroid 1st. ~  FLP 4/13 on Lip20+Lopid600 showed TChol 193, TG 221, HDL 58, LDL 102... Needs better low fat diet. ~  FLP 10/13 on Lip20+Lopid600 showed TChol 182, TG 198, HDL 51, LDL 92 ~  FLP 4/14 on  Lip20+Lopid600 showed TChol 151, TG 162, HDL 52, LDL 66  ~  FLP 10/14 on Lip20+Lopid600 showed TChol 183, TG 212, HDL 58, LDL 97   HYPOTHYROID >> on SYNTHROID 53mcg/d... ~  Labs 10/11 showed TSH= 4.27 ~  Labs 10/12 showed TSH= 5.74... rec to start Synthroid 41mcg/d... ~  Labs 4/13 on Levothy50 showed TSH= 3.94 ~  Labs 10/13 on Levo50 showed TSH= 2.40  IRRITABLE BOWEL SYNDROME (ICD-564.1) - last colon 2/04 by DrPatterson was normal (f/u rec for 50yrs)... hx of hemorroids and constipation in the past (rec to take Miralax & Senakot-S)... denies nausea, vomiting, heartburn, diarrhea, constipation, blood in stool, abdominal pain, swelling, gas... ~  Colonoscopy 9/14 by DrDBrodie showed mild divertics in sigmoid w/ spasm & tortuosity, no lesion seen...  LOW BACK PAIN (ICD-724.2) - she has hx of HNP, manages OK w/ exercise (walking) and Tylenol. OSTEOPENIA (ICD-733.90) - she stopped Fosamax due to side effects but tolerated BONIVA (on hold now w/ improved BMD)... also takes ca++, MVI, Vit D 1000 daily. ~  BMD 1/08 showed TScores -0.6 in spine, and -1.4 in fem neck... ~  BMD 12/09 showed TScores +1.0 in  Spine & -0.8 in left FemNeck... rec> stop Boniva. ~  12/10:  no bone symptoms- on drug holiday at present... ~  5/11: remains asymptomatic, doing well & exercises by walking. ~  labs 10/11 showed Vit D level = 56 ~  BMD 4/12 showed TScores +0.9 in Spine and -0.9 in left FemNeck... Continue off the Boniva. ~  Labs 10/12 showed Vit D level = 39... rec incr OTC supplement to 2000u daily. ~  10/13:  C/o left hip pain & Rx w/ OTC Aleve, Tylenol... ~  10/14:  Stable on OTC meds as needed...  ANXIETY (ICD-300.00) - uses ALPRAZOLAM 0.25mg  Prn & asked to incr med for stress...  HEALTH MAINTENANCE - she sees [facility] for GYN- PAP & Mammogram are up-to-date (neg mammogram 6/14)... BMD here 12/09 & 4/12 as noted... colonoscopy 2/04 by DrPatterson w/ rec for f/u in 10 years... she's had her 2011 Flu shot this season, had Pneumovax 6 yrs ago at age 80... received TDAP 5/11, and had the Shingles vaccine 6/11...   Past Surgical History  Procedure Laterality Date  . Abdominal hysterectomy    . Cataract surgery  5/12 and 12/24/2012    both eyes    Outpatient Encounter Prescriptions as of 11/03/2013  Medication Sig  . ALPRAZolam (XANAX) 0.25 MG tablet Take 1 tablet (0.25 mg total) by mouth 3 (three) times daily as needed for anxiety.  Marland Kitchen amLODipine (NORVASC) 5 MG tablet Take 1 tablet (5 mg total) by mouth daily.  Marland Kitchen aspirin 81 MG tablet Take 81 mg by mouth every evening.   Marland Kitchen atorvastatin (LIPITOR) 20 MG tablet Take 1 tablet (20 mg total) by mouth daily.  . diphenhydramine-acetaminophen (TYLENOL PM) 25-500 MG TABS Take 1 tablet by mouth at  bedtime as needed.    . hydrALAZINE (APRESOLINE) 25 MG tablet Take 25 mg by mouth 2 (two) times daily.   . hydrochlorothiazide (MICROZIDE) 12.5 MG capsule Take 1 capsule (12.5 mg total) by mouth daily.  Marland Kitchen levothyroxine (SYNTHROID, LEVOTHROID) 50 MCG tablet Take 1 tablet (50 mcg total) by mouth daily before breakfast.  . metoprolol succinate (TOPROL-XL) 50 MG 24 hr tablet  Take 1 tablet (50 mg total) by mouth 2 (two) times daily.  . [DISCONTINUED] ALPRAZolam (XANAX) 0.25 MG tablet Take 1 tablet (0.25 mg total) by mouth 3 (three) times daily as needed for anxiety.  . [DISCONTINUED] amLODipine (NORVASC) 5 MG tablet Take 1 tablet (5 mg total) by mouth daily.  . [DISCONTINUED] atorvastatin (LIPITOR) 20 MG tablet Take 1 tablet (20 mg total) by mouth daily.  . [DISCONTINUED] gemfibrozil (LOPID) 600 MG tablet Take 1 tablet (600 mg total) by mouth daily.  . [DISCONTINUED] hydrochlorothiazide (MICROZIDE) 12.5 MG capsule Take 1 capsule (12.5 mg total) by mouth daily.  . [DISCONTINUED] levothyroxine (SYNTHROID, LEVOTHROID) 50 MCG tablet Take 50 mcg by mouth daily before breakfast.  . [DISCONTINUED] metoprolol succinate (TOPROL-XL) 50 MG 24 hr tablet Take 50 mg by mouth 2 (two) times daily.    Allergies  Allergen Reactions  . Alendronate Sodium     REACTION: pt states "short of breath"  . Amoxicillin     REACTION: causes severe diarrhea  . Codeine     REACTION: pt states nausea  . Valsartan     REACTION: gums/throat swelling    Current Medications, Allergies, Past Medical History, Past Surgical History, Family History, and Social History were reviewed in [employer] record.    Review of Systems        See HPI - all other systems neg except as noted... The patient denies anorexia, fever, weight loss, weight gain, vision loss, decreased hearing, hoarseness, chest pain, syncope, dyspnea on exertion, peripheral edema, prolonged cough, headaches, hemoptysis, abdominal pain, melena, hematochezia, severe indigestion/heartburn, hematuria, incontinence, muscle weakness, suspicious skin lesions, transient blindness, difficulty walking, depression, unusual weight change, abnormal bleeding, enlarged lymph nodes, and angioedema.     Objective:   Physical Exam     WD, WN, 80 y/o WF in NAD... GENERAL:  Alert & oriented; pleasant & cooperative... HEENT:   /AT, EOM-wnl, PERRLA, Fundi-benign, EACs- dry wax, TMs- not seen, NOSE-clear, THROAT-clear & wnl. NECK:  Supple w/ fairROM; no JVD; normal carotid impulses w/o bruits; no thyromegaly or nodules palpated; no lymphadenopathy. CHEST:  Clear to P & A; without wheezes/ rales/ or rhonchi. HEART:  Regular Rhythm; without murmurs/ rubs/ or gallops. ABDOMEN:  Soft & nontender; normal bowel sounds; no organomegaly or masses detected. EXT: without deformities or arthritic changes; no varicose veins/ venous insuffic/ or edema. NEURO:  CN's intact; motor testing normal; sensory testing normal; gait normal & balance OK. DERM:  No lesions noted; no rash etc...  RADIOLOGY DATA:  Reviewed in the EPIC EMR & discussed w/ the patient...  LABORATORY DATA:  Reviewed in the EPIC EMR & discussed w/ the patient...   Assessment & Plan:    HBP>  Controlled w/ med adjustments; she needs to de-stress!     Lipids> Treated w/ [place];  With elev TGs may need to incr the Lopid but she wants to wait & try better low fat diet...     Hypothy>  On Synthroid 90mcg/d now & TSH is wnl...    IBS>  Last colonoscopy was 9/14 w/ divertics,  otherw neg...     Osteop>  She has been off the Boniva ~23yrs and f/u BMD shows normal TScores -0.9 in left fem neck;  Continue calcium, MVI, & incr VitD to 2000u/d (VitD=39).     Anxiety>  On Alprazolam prn...   Patient's Medications  New Prescriptions   No medications on file  Previous Medications   ASPIRIN 81 MG TABLET    Take 81 mg by mouth every evening.    DIPHENHYDRAMINE-ACETAMINOPHEN (TYLENOL PM) 25-500 MG TABS    Take 1 tablet by mouth at bedtime as needed.     HYDRALAZINE (APRESOLINE) 25 MG TABLET    Take 25 mg by mouth 2 (two) times daily.   Modified Medications   Modified Medication Previous Medication   ALPRAZOLAM (XANAX) 0.25 MG TABLET ALPRAZolam (XANAX) 0.25 MG tablet      Take 1 tablet (0.25 mg total) by mouth 3 (three) times daily as needed for anxiety.     Take 1 tablet (0.25 mg total) by mouth 3 (three) times daily as needed for anxiety.   AMLODIPINE (NORVASC) 5 MG TABLET amLODipine (NORVASC) 5 MG tablet      Take 1 tablet (5 mg total) by mouth daily.    Take 1 tablet (5 mg total) by mouth daily.   ATORVASTATIN (LIPITOR) 20 MG TABLET atorvastatin (LIPITOR) 20 MG tablet      Take 1 tablet (20 mg total) by mouth daily.    Take 1 tablet (20 mg total) by mouth daily.   GEMFIBROZIL (LOPID) 600 MG TABLET gemfibrozil (LOPID) 600 MG tablet      Take 1 tablet (600 mg total) by mouth daily.    Take 1 tablet (600 mg total) by mouth daily.   HYDROCHLOROTHIAZIDE (MICROZIDE) 12.5 MG CAPSULE hydrochlorothiazide (MICROZIDE) 12.5 MG capsule      Take 1 capsule (12.5 mg total) by mouth daily.    Take 1 capsule (12.5 mg total) by mouth daily.   LEVOTHYROXINE (SYNTHROID, LEVOTHROID) 50 MCG TABLET levothyroxine (SYNTHROID, LEVOTHROID) 50 MCG tablet      Take 1 tablet (50 mcg total) by mouth daily before breakfast.    Take 50 mcg by mouth daily before breakfast.   METOPROLOL SUCCINATE (TOPROL-XL) 50 MG 24 HR TABLET metoprolol succinate (TOPROL-XL) 50 MG 24 hr tablet      Take 1 tablet (50 mg total) by mouth 2 (two) times daily.    Take 50 mg by mouth 2 (two) times daily.  Discontinued Medications   No medications on file

## 2013-11-22 ENCOUNTER — Other Ambulatory Visit: Payer: Self-pay | Admitting: Pulmonary Disease

## 2013-11-29 ENCOUNTER — Ambulatory Visit: Payer: Medicare Other | Admitting: Pulmonary Disease

## 2013-11-30 ENCOUNTER — Ambulatory Visit (INDEPENDENT_AMBULATORY_CARE_PROVIDER_SITE_OTHER): Payer: Medicare Other | Admitting: Family Medicine

## 2013-11-30 ENCOUNTER — Encounter: Payer: Self-pay | Admitting: Family Medicine

## 2013-11-30 VITALS — BP 136/84 | HR 73 | Temp 97.7°F | Wt 136.0 lb

## 2013-11-30 DIAGNOSIS — K589 Irritable bowel syndrome without diarrhea: Secondary | ICD-10-CM

## 2013-11-30 DIAGNOSIS — I10 Essential (primary) hypertension: Secondary | ICD-10-CM

## 2013-11-30 DIAGNOSIS — E785 Hyperlipidemia, unspecified: Secondary | ICD-10-CM

## 2013-11-30 DIAGNOSIS — E039 Hypothyroidism, unspecified: Secondary | ICD-10-CM

## 2013-11-30 DIAGNOSIS — Z23 Encounter for immunization: Secondary | ICD-10-CM

## 2013-11-30 LAB — LIPID PANEL
CHOLESTEROL: 177 mg/dL (ref 0–200)
HDL: 53.2 mg/dL (ref >39.00)
LDL Cholesterol: 87 mg/dL (ref 0–99)
Total CHOL/HDL Ratio: 3
Triglycerides: 185 mg/dL — ABNORMAL HIGH (ref 0.0–149.0)
VLDL: 37 mg/dL (ref 0.0–40.0)

## 2013-11-30 LAB — BASIC METABOLIC PANEL
BUN: 15 mg/dL (ref 6–23)
CO2: 26 mEq/L (ref 19–32)
CREATININE: 0.8 mg/dL (ref 0.4–1.2)
Calcium: 9.8 mg/dL (ref 8.4–10.5)
Chloride: 103 mEq/L (ref 96–112)
GFR: 75.82 mL/min (ref >60.00)
Glucose, Bld: 99 mg/dL (ref 70–99)
POTASSIUM: 3.7 meq/L (ref 3.5–5.1)
Sodium: 139 mEq/L (ref 135–145)

## 2013-11-30 LAB — TSH: TSH: 1.91 u[IU]/mL (ref 0.35–4.50)

## 2013-11-30 NOTE — Progress Notes (Signed)
   Subjective:    Patient ID: Emily Cannon, female    DOB: [birth year], 80 y.o.   MRN: [id number]  HPI Patient is seen to establish care. She has chronic problems including hypertension, hyperlipidemia, allergic rhinitis, hypothyroidism, irritable bowel syndrome. She is on multidrug regimen for hypertension. She apparently has history of whitecoat syndrome. Her blood pressures usually run [id number] systolic at home. Occasional rare readings up around 180.  She's had no recent chest pains. No dizziness. Rare headaches. She has hypothyroidism and has not had TSH blood levels since 2013. She has hyperlipidemia treated with atorvastatin and Lopid. No recent myalgias. She complains in general with difficulties handling stress issues. She has name of counselor but has not set up anything yet. She has chronic insomnia and takes low-dose alprazolam for that.  Past Medical History  Diagnosis Date  . Allergic rhinitis, cause unspecified   . Perforation of tympanic membrane, unspecified   . Unspecified essential hypertension   . Other and unspecified hyperlipidemia   . Irritable bowel syndrome   . Acquired absence of organ, genital organs   . Lumbago   . Disorder of bone and cartilage, unspecified   . Anxiety state, unspecified    Past Surgical History  Procedure Laterality Date  . Abdominal hysterectomy    . Cataract surgery  5/12 and 12/24/2012    both eyes    reports that she has never smoked. She has never used smokeless tobacco. She reports that she does not drink alcohol or use illicit drugs. family history includes COPD in her other; Lung cancer (age of onset: 21) in her mother; Lung disease (age of onset: 48) in her father. There is no history of Colon cancer. Allergies  Allergen Reactions  . Alendronate Sodium     REACTION: pt states "short of breath"  . Amoxicillin     REACTION: causes severe diarrhea  . Codeine     REACTION: pt states nausea  . Valsartan     REACTION: gums/throat  swelling      Review of Systems  Constitutional: Negative for fatigue and unexpected weight change.  Eyes: Negative for visual disturbance.  Respiratory: Negative for cough, chest tightness, shortness of breath and wheezing.   Cardiovascular: Negative for chest pain, palpitations and leg swelling.  Endocrine: Negative for polydipsia and polyuria.  Neurological: Negative for dizziness, seizures, syncope, weakness, light-headedness and headaches.       Objective:   Physical Exam  Constitutional: She appears well-developed and well-nourished.  Neck: Neck supple. No thyromegaly present.  Cardiovascular: Normal rate and regular rhythm.  Exam reveals no gallop.   Pulmonary/Chest: Effort normal and breath sounds normal. No respiratory distress. She has no wheezes. She has no rales.  Musculoskeletal: She exhibits no edema.  Neurological: She is alert.          Assessment & Plan:  #1 hypertension. Initial reading today 136/84 but I obtained a reading of 180/80 left arm seated and standing. By home readings she has been fairly well controlled. Probable white coat syndrome. Continue close monitoring. Reassess in one month and bring her home blood pressure cuff to compare with ours at that time. Consider addition of angiotensin receptor blocker if still elevated then #2 hyperlipidemia. Check lipid and hepatic panel #3 health maintenance. Prevnar 13 immunization given #4 hypothyroidism. Recheck TSH

## 2013-11-30 NOTE — Progress Notes (Signed)
Pre visit review using our clinic review tool, if applicable. No additional management support is needed unless otherwise documented below in the visit note. 

## 2013-11-30 NOTE — Patient Instructions (Signed)
Monitor blood pressure and let me know if consistently > 150/90 Bring blood pressure cuff at follow up in one month,

## 2013-12-01 ENCOUNTER — Telehealth: Payer: Self-pay | Admitting: Family Medicine

## 2013-12-01 ENCOUNTER — Ambulatory Visit: Payer: Medicare Other | Admitting: Family Medicine

## 2013-12-01 NOTE — Telephone Encounter (Signed)
Relevant patient education assigned to patient using Emmi. ° °

## 2013-12-11 ENCOUNTER — Other Ambulatory Visit: Payer: Self-pay | Admitting: Pulmonary Disease

## 2013-12-13 MED ORDER — HYDRALAZINE HCL 25 MG PO TABS: 25.0000 mg | ORAL_TABLET | Freq: Two times a day (BID) | ORAL | Status: DC

## 2013-12-17 ENCOUNTER — Ambulatory Visit (INDEPENDENT_AMBULATORY_CARE_PROVIDER_SITE_OTHER): Payer: Medicare Other | Admitting: Family Medicine

## 2013-12-17 ENCOUNTER — Encounter: Payer: Self-pay | Admitting: Family Medicine

## 2013-12-17 VITALS — BP 160/80 | HR 72 | Temp 98.3°F | Wt 136.0 lb

## 2013-12-17 DIAGNOSIS — I10 Essential (primary) hypertension: Secondary | ICD-10-CM

## 2013-12-17 MED ORDER — AMLODIPINE BESYLATE 10 MG PO TABS: 10.0000 mg | ORAL_TABLET | Freq: Every day | ORAL | Status: DC

## 2013-12-17 NOTE — Progress Notes (Signed)
Pre visit review using our clinic review tool, if applicable. No additional management support is needed unless otherwise documented below in the visit note. 

## 2013-12-17 NOTE — Progress Notes (Signed)
   Subjective:    Patient ID: Emily Cannon, female    DOB: 25-Dec-1932, 80 y.o.   MRN: [id number]  Hypertension Pertinent negatives include no chest pain, headaches, palpitations or shortness of breath.   Seen for elevated blood pressure. Yesterday she was at ENT office and had reading of 215/91 right arm and 204/120 left arm. She did have some mild headache. They called here and we suggested increasing amlodipine to 10 mg but apparently she took an extra HCTZ last night. In any event, her blood pressure is somewhat better today. She still feels slightly fatigued but had no chest pains. No peripheral edema issues. No change in diet. No alcohol consumption. No recent nonsteroidal use. She's been compliant with medications. She takes 4 drug regimen for hypertension include amlodipine, HCTZ, hydralazine, and metoprolol.  Past Medical History  Diagnosis Date  . Allergic rhinitis, cause unspecified   . Perforation of tympanic membrane, unspecified   . Unspecified essential hypertension   . Other and unspecified hyperlipidemia   . Irritable bowel syndrome   . Acquired absence of organ, genital organs   . Lumbago   . Disorder of bone and cartilage, unspecified   . Anxiety state, unspecified    Past Surgical History  Procedure Laterality Date  . Abdominal hysterectomy    . Cataract surgery  5/12 and 12/24/2012    both eyes    reports that she has never smoked. She has never used smokeless tobacco. She reports that she does not drink alcohol or use illicit drugs. family history includes COPD in her other; Lung cancer (age of onset: 6) in her mother; Lung disease (age of onset: 43) in her father. There is no history of Colon cancer. Allergies  Allergen Reactions  . Alendronate Sodium     REACTION: pt states "short of breath"  . Amoxicillin     REACTION: causes severe diarrhea  . Codeine     REACTION: pt states nausea  . Valsartan     REACTION: gums/throat swelling      Review of  Systems  Constitutional: Positive for fatigue.  Eyes: Negative for visual disturbance.  Respiratory: Negative for cough, chest tightness, shortness of breath and wheezing.   Cardiovascular: Negative for chest pain, palpitations and leg swelling.  Neurological: Negative for dizziness, seizures, syncope, weakness, light-headedness and headaches.       Objective:   Physical Exam  Constitutional: She appears well-developed and well-nourished.  Neck: Neck supple. No thyromegaly present.  Cardiovascular: Normal rate and regular rhythm.  Exam reveals no gallop.   Pulmonary/Chest: Effort normal and breath sounds normal. No respiratory distress. She has no wheezes. She has no rales.  Musculoskeletal: She exhibits no edema.  Lymphadenopathy:    She has no cervical adenopathy.          Assessment & Plan:  Hypertension. Poorly controlled. Avoid nonsteroidals. Low-sodium diet. Titrate amlodipine to 10 mg once daily. Watch for edema. She has followup appointment in June. Keep other blood pressure medications the same. If she develops any edema issues with amlodipine consider further titration of hydralazine. She's had previous intolerance with losartan with possible angioedema type reaction. This would make use of angiotensin receptor blocker or ACE inhibitor prohibited

## 2013-12-17 NOTE — Patient Instructions (Signed)
Increase Amlodipine to 10 mg once daily and keep all other blood pressure medications just as you have been taking them

## 2013-12-29 ENCOUNTER — Other Ambulatory Visit: Payer: Self-pay | Admitting: Pulmonary Disease

## 2013-12-30 ENCOUNTER — Other Ambulatory Visit: Payer: Self-pay | Admitting: Pulmonary Disease

## 2013-12-31 ENCOUNTER — Encounter: Payer: Self-pay | Admitting: Family Medicine

## 2013-12-31 ENCOUNTER — Telehealth: Payer: Self-pay | Admitting: Family Medicine

## 2013-12-31 ENCOUNTER — Ambulatory Visit (INDEPENDENT_AMBULATORY_CARE_PROVIDER_SITE_OTHER): Payer: Medicare Other | Admitting: Family Medicine

## 2013-12-31 VITALS — BP 142/82 | HR 80 | Wt 136.0 lb

## 2013-12-31 DIAGNOSIS — I10 Essential (primary) hypertension: Secondary | ICD-10-CM

## 2013-12-31 MED ORDER — GEMFIBROZIL 600 MG PO TABS: 600.0000 mg | ORAL_TABLET | Freq: Every day | ORAL | Status: DC

## 2013-12-31 NOTE — Progress Notes (Signed)
   Subjective:    Patient ID: Emily Cannon, female    DOB: June 05, 1933, 80 y.o.   MRN: [id number]  HPI Patient seen for followup hypertension. She's on multidrug regimen. Compliant with all. 2 weeks ago we increased her amlodipine to 10 mg daily. No headaches or peripheral edema. Prior intolerance with angiotensin receptor blockers with possible angioedema response. She is currently not doing any walking. No headaches. No chest pains. No dizziness.  Past Medical History  Diagnosis Date  . Allergic rhinitis, cause unspecified   . Perforation of tympanic membrane, unspecified   . Unspecified essential hypertension   . Other and unspecified hyperlipidemia   . Irritable bowel syndrome   . Acquired absence of organ, genital organs   . Lumbago   . Disorder of bone and cartilage, unspecified   . Anxiety state, unspecified    Past Surgical History  Procedure Laterality Date  . Abdominal hysterectomy    . Cataract surgery  5/12 and 12/24/2012    both eyes    reports that she has never smoked. She has never used smokeless tobacco. She reports that she does not drink alcohol or use illicit drugs. family history includes COPD in her other; Lung cancer (age of onset: 74) in her mother; Lung disease (age of onset: 68) in her father. There is no history of Colon cancer. Allergies  Allergen Reactions  . Alendronate Sodium     REACTION: pt states "short of breath"  . Amoxicillin     REACTION: causes severe diarrhea  . Codeine     REACTION: pt states nausea  . Valsartan     REACTION: gums/throat swelling      Review of Systems  Constitutional: Negative for fatigue and unexpected weight change.  Eyes: Negative for visual disturbance.  Respiratory: Negative for cough, chest tightness, shortness of breath and wheezing.   Cardiovascular: Negative for chest pain, palpitations and leg swelling.  Neurological: Negative for dizziness, seizures, syncope, weakness, light-headedness and headaches.         Objective:   Physical Exam  Constitutional: She appears well-developed and well-nourished.  Cardiovascular: Normal rate and regular rhythm.   Pulmonary/Chest: Effort normal and breath sounds normal. No respiratory distress. She has no wheezes. She has no rales.  Musculoskeletal: She exhibits no edema.          Assessment & Plan:  Hypertension. Improved. Get back to her regular walking program. Continue monitoring and be in touch if consistently over 150/90. Plan routine followup in 2 months

## 2013-12-31 NOTE — Patient Instructions (Signed)
Continue to monitor blood pressure and be in touch if BP consistently > 150/90

## 2013-12-31 NOTE — Telephone Encounter (Signed)
Rx sent to pharmacy   

## 2013-12-31 NOTE — Progress Notes (Signed)
Pre visit review using our clinic review tool, if applicable. No additional management support is needed unless otherwise documented below in the visit note. 

## 2013-12-31 NOTE — Telephone Encounter (Signed)
[place], [place] is requesting re-fill on gemfibrozil (LOPID) 600 MG tablet

## 2014-01-07 ENCOUNTER — Other Ambulatory Visit: Payer: Self-pay | Admitting: Pulmonary Disease

## 2014-01-13 ENCOUNTER — Telehealth: Payer: Self-pay | Admitting: Family Medicine

## 2014-01-13 MED ORDER — METOPROLOL SUCCINATE ER 50 MG PO TB24: ORAL_TABLET | ORAL | Status: DC

## 2014-01-13 MED ORDER — HYDRALAZINE HCL 25 MG PO TABS: 25.0000 mg | ORAL_TABLET | Freq: Two times a day (BID) | ORAL | Status: DC

## 2014-01-13 NOTE — Telephone Encounter (Signed)
[place], [place] is requesting re-fills on the following: metoprolol succinate (TOPROL-XL) 50 MG 24 hr tablet hydrALAZINE (APRESOLINE) 25 MG tablet

## 2014-01-13 NOTE — Telephone Encounter (Signed)
Sent to the pharmacy by e-scribe. 

## 2014-02-10 ENCOUNTER — Telehealth: Payer: Self-pay | Admitting: Family Medicine

## 2014-02-10 MED ORDER — HYDROCHLOROTHIAZIDE 12.5 MG PO CAPS: 12.5000 mg | ORAL_CAPSULE | Freq: Every day | ORAL | Status: DC

## 2014-02-10 NOTE — Telephone Encounter (Signed)
[place], [place] is requesting re-fill on hydrochlorothiazide (MICROZIDE) 12.5 MG capsule

## 2014-02-10 NOTE — Telephone Encounter (Signed)
RX sent to pharmacy  

## 2014-03-02 ENCOUNTER — Ambulatory Visit: Payer: Medicare Other | Admitting: Family Medicine

## 2014-03-09 ENCOUNTER — Other Ambulatory Visit: Payer: Self-pay | Admitting: Family Medicine

## 2014-03-09 ENCOUNTER — Ambulatory Visit (INDEPENDENT_AMBULATORY_CARE_PROVIDER_SITE_OTHER): Payer: Medicare Other | Admitting: Family Medicine

## 2014-03-09 ENCOUNTER — Encounter: Payer: Self-pay | Admitting: Family Medicine

## 2014-03-09 VITALS — BP 148/78 | HR 64 | Temp 98.3°F | Wt 136.0 lb

## 2014-03-09 DIAGNOSIS — I10 Essential (primary) hypertension: Secondary | ICD-10-CM

## 2014-03-09 NOTE — Progress Notes (Signed)
   Subjective:    Patient ID: Emily Cannon, female    DOB: 06-Mar-1933, 80 y.o.   MRN: [id number]  Hypertension Pertinent negatives include no chest pain, headaches, palpitations or shortness of breath.   Followup hypertension. Patient takes 4 drug regimen. She's had previous possible angioedema with angiotensin receptor blocker and thus cannot take that class nor ACE inhibitor. She recently got new blood pressure cuff. Her readings have varied considerably. No headaches. Mild malaise. No chest pains. No dizziness. No peripheral edema issues. Compliant with medications. Denies any side effects  Past Medical History  Diagnosis Date  . Allergic rhinitis, cause unspecified   . Perforation of tympanic membrane, unspecified   . Unspecified essential hypertension   . Other and unspecified hyperlipidemia   . Irritable bowel syndrome   . Acquired absence of organ, genital organs   . Lumbago   . Disorder of bone and cartilage, unspecified   . Anxiety state, unspecified    Past Surgical History  Procedure Laterality Date  . Abdominal hysterectomy    . Cataract surgery  5/12 and 12/24/2012    both eyes    reports that she has never smoked. She has never used smokeless tobacco. She reports that she does not drink alcohol or use illicit drugs. family history includes COPD in her other; Lung cancer (age of onset: 41) in her mother; Lung disease (age of onset: 94) in her father. There is no history of Colon cancer. Allergies  Allergen Reactions  . Alendronate Sodium     REACTION: pt states "short of breath"  . Amoxicillin     REACTION: causes severe diarrhea  . Codeine     REACTION: pt states nausea  . Valsartan     REACTION: gums/throat swelling      Review of Systems  Constitutional: Positive for fatigue.  Eyes: Negative for visual disturbance.  Respiratory: Negative for cough, chest tightness, shortness of breath and wheezing.   Cardiovascular: Negative for chest pain,  palpitations and leg swelling.  Neurological: Negative for dizziness, seizures, syncope, weakness, light-headedness and headaches.       Objective:   Physical Exam  Constitutional: She appears well-developed and well-nourished.  Neck: Neck supple. No thyromegaly present.  Cardiovascular: Normal rate and regular rhythm.   Pulmonary/Chest: Effort normal and breath sounds normal. No respiratory distress. She has no wheezes. She has no rales.  Musculoskeletal: She exhibits no edema.          Assessment & Plan:  Hypertension. Marginal control. Continue regular walking. Continue sodium reduction. Reassess in 3 months. Bring blood pressure cuff then and if consistently above Q000111Q systolic at that time consider titration of hydralazine

## 2014-03-09 NOTE — Progress Notes (Signed)
Pre visit review using our clinic review tool, if applicable. No additional management support is needed unless otherwise documented below in the visit note. 

## 2014-03-24 ENCOUNTER — Other Ambulatory Visit: Payer: Self-pay | Admitting: Pulmonary Disease

## 2014-04-18 ENCOUNTER — Telehealth: Payer: Self-pay | Admitting: Family Medicine

## 2014-04-18 MED ORDER — ATORVASTATIN CALCIUM 20 MG PO TABS: 20.0000 mg | ORAL_TABLET | Freq: Every day | ORAL | Status: DC

## 2014-04-18 NOTE — Telephone Encounter (Signed)
Emily Cannon, Emily Cannon is requesting re-fill on atorvastatin (LIPITOR) 20 MG tablet

## 2014-04-18 NOTE — Telephone Encounter (Signed)
Rx sent to pharmacy   

## 2014-05-26 ENCOUNTER — Other Ambulatory Visit: Payer: Self-pay | Admitting: Family Medicine

## 2014-05-30 ENCOUNTER — Encounter: Payer: Self-pay | Admitting: Family Medicine

## 2014-05-30 ENCOUNTER — Ambulatory Visit (INDEPENDENT_AMBULATORY_CARE_PROVIDER_SITE_OTHER): Payer: Medicare Other | Admitting: Family Medicine

## 2014-05-30 ENCOUNTER — Ambulatory Visit (INDEPENDENT_AMBULATORY_CARE_PROVIDER_SITE_OTHER): Payer: Medicare Other

## 2014-05-30 VITALS — BP 150/78 | HR 70 | Temp 98.2°F | Ht 61.0 in | Wt 135.0 lb

## 2014-05-30 DIAGNOSIS — Z23 Encounter for immunization: Secondary | ICD-10-CM

## 2014-05-30 DIAGNOSIS — E785 Hyperlipidemia, unspecified: Secondary | ICD-10-CM

## 2014-05-30 DIAGNOSIS — I10 Essential (primary) hypertension: Secondary | ICD-10-CM

## 2014-05-30 DIAGNOSIS — E038 Other specified hypothyroidism: Secondary | ICD-10-CM

## 2014-05-30 DIAGNOSIS — Z0000 Encounter for general adult medical examination without abnormal findings: Secondary | ICD-10-CM

## 2014-05-30 LAB — LIPID PANEL
Cholesterol: 169 mg/dL (ref 0–200)
HDL: 49.8 mg/dL (ref >39.00)
LDL CALC: 83 mg/dL (ref 0–99)
NONHDL: 119.2
Total CHOL/HDL Ratio: 3
Triglycerides: 179 mg/dL — ABNORMAL HIGH (ref 0.0–149.0)
VLDL: 35.8 mg/dL (ref 0.0–40.0)

## 2014-05-30 LAB — BASIC METABOLIC PANEL
BUN: 14 mg/dL (ref 6–23)
CO2: 25 meq/L (ref 19–32)
Calcium: 9.7 mg/dL (ref 8.4–10.5)
Chloride: 102 mEq/L (ref 96–112)
Creatinine, Ser: 0.8 mg/dL (ref 0.4–1.2)
GFR: 71.48 mL/min (ref >60.00)
GLUCOSE: 100 mg/dL — AB (ref 70–99)
Potassium: 4.4 mEq/L (ref 3.5–5.1)
SODIUM: 139 meq/L (ref 135–145)

## 2014-05-30 LAB — HEPATIC FUNCTION PANEL
ALBUMIN: 4 g/dL (ref 3.5–5.2)
ALT: 17 U/L (ref 0–35)
AST: 22 U/L (ref 0–37)
Alkaline Phosphatase: 57 U/L (ref 39–117)
Bilirubin, Direct: 0 mg/dL (ref 0.0–0.3)
Total Bilirubin: 0.5 mg/dL (ref 0.2–1.2)
Total Protein: 7.8 g/dL (ref 6.0–8.3)

## 2014-05-30 LAB — TSH: TSH: 2.37 u[IU]/mL (ref 0.35–4.50)

## 2014-05-30 NOTE — Progress Notes (Signed)
Subjective:    Patient ID: Emily Cannon, female    DOB: [birth year], 80 y.o.   MRN: [id number]  HPI Patient seen for Medicare wellness exam and medical follow-up. Her chronic problems include history of hypertension, hypothyroidism, hyperlipidemia, chronic anxiety, irritable bowel syndrome. Her blood pressures been challenging to control and we suspect white coat syndrome. She has consistently gotten better home blood pressure readings. Medications reviewed. Compliant with all. She still needs flu vaccine. Other immunizations are up-to-date.  Denies any recent falls. No depression issues.she still sees gynecologist. She gets mammograms yearly and has gotten Pap smears through their office still. Her colonoscopy is up-to-date  Past Medical History  Diagnosis Date  . Allergic rhinitis, cause unspecified   . Perforation of tympanic membrane, unspecified   . Unspecified essential hypertension   . Other and unspecified hyperlipidemia   . Irritable bowel syndrome   . Acquired absence of organ, genital organs   . Lumbago   . Disorder of bone and cartilage, unspecified   . Anxiety state, unspecified    Past Surgical History  Procedure Laterality Date  . Abdominal hysterectomy    . Cataract surgery  5/12 and 12/24/2012    both eyes    reports that she has never smoked. She has never used smokeless tobacco. She reports that she does not drink alcohol or use illicit drugs. family history includes COPD in her other; Lung cancer (age of onset: 27) in her mother; Lung disease (age of onset: 55) in her father. There is no history of Colon cancer. Allergies  Allergen Reactions  . Alendronate Sodium     REACTION: pt states "short of breath"  . Amoxicillin     REACTION: causes severe diarrhea  . Codeine     REACTION: pt states nausea  . Valsartan     REACTION: gums/throat swelling   1.  Risk factors based on Past Medical , Social, and Family history reviewed and as indicated above with no  changes 2.  Limitations in physical activities None.  No recent falls. 3.  Depression/mood No active depression or anxiety issues 4.  Hearing she has some bilateral hearing loss and plans to see audiologist soon. 5.  ADLs independent in all. 6.  Cognitive function (orientation to time and place, language, writing, speech,memory) no short or long term memory issues.  Language and judgement intact. 7.  Home Safety no issues 8.  Height, weight, and visual acuity.all stable. 9.  Counseling discussed importance of regular weightbearing exercise and adequate calcium and vitamin D intake. 10. Recommendation of preventive services.yearly flu vaccine (flu vaccine given today). Tetanus every 10 years. already had shingles vaccine. 11. Labs based on risk factors -basic metabolic panel, hepatic panel, lipid panel, and TSH 12. Care Plan as above 13. Other Providers-none 14. Written schedule of screening/prevention services given to patient.    Review of Systems  Constitutional: Negative for fever, activity change, appetite change, fatigue and unexpected weight change.  HENT: Negative for ear pain, hearing loss, sore throat and trouble swallowing.   Eyes: Negative for visual disturbance.  Respiratory: Negative for cough and shortness of breath.   Cardiovascular: Negative for chest pain and palpitations.  Gastrointestinal: Negative for abdominal pain, diarrhea, constipation and blood in stool.  Genitourinary: Negative for dysuria and hematuria.  Musculoskeletal: Negative for myalgias, back pain and arthralgias.  Skin: Negative for rash.  Neurological: Negative for dizziness, syncope and headaches.  Hematological: Negative for adenopathy.  Psychiatric/Behavioral: Negative for confusion and dysphoric mood.  Objective:   Physical Exam  Constitutional: She is oriented to person, place, and time. She appears well-developed and well-nourished.  HENT:  Head: Normocephalic and atraumatic.  Eyes:  EOM are normal. Pupils are equal, round, and reactive to light.  Neck: Normal range of motion. Neck supple. No thyromegaly present.  Cardiovascular: Normal rate, regular rhythm and normal heart sounds.   No murmur heard. Pulmonary/Chest: Breath sounds normal. No respiratory distress. She has no wheezes. She has no rales.  Abdominal: Soft. Bowel sounds are normal. She exhibits no distension and no mass. There is no tenderness. There is no rebound and no guarding.  Genitourinary:  Per GYN  Musculoskeletal: Normal range of motion. She exhibits no edema.  Lymphadenopathy:    She has no cervical adenopathy.  Neurological: She is alert and oriented to person, place, and time. She displays normal reflexes. No cranial nerve deficit.  Skin: No rash noted.  Psychiatric: She has a normal mood and affect. Her behavior is normal. Judgment and thought content normal.          Assessment & Plan:  #1 health maintenance. Flu vaccine given. Other immunizations up-to-date. Colonoscopy up-to-date. She will continue with yearly mammogram. #2 dyslipidemia. Check lipid and hepatic panel. She is on combination with Lopid and Lipitor and has no history of any myalgias. #3 hypertension. Marginal control systolically. Continue close monitoring. Additional treatment if consistently over Q000111Q systolic. She denies any history of diabetes or chronic kidney disease. Reassess one month and she will bring her monitor then. #4 hypothyroidism. Check TSH.

## 2014-05-30 NOTE — Progress Notes (Signed)
Pre visit review using our clinic review tool, if applicable. No additional management support is needed unless otherwise documented below in the visit note. 

## 2014-05-30 NOTE — Patient Instructions (Signed)
Continue with yearly flu vaccine. We will call you with lab results. Continue to monitor blood pressure closely and be in touch if consistently over 150/90 Tetanus booster every 10 years.

## 2014-06-02 ENCOUNTER — Telehealth: Payer: Self-pay | Admitting: Family Medicine

## 2014-06-02 MED ORDER — LEVOTHYROXINE SODIUM 50 MCG PO TABS: 50.0000 ug | ORAL_TABLET | Freq: Every day | ORAL | Status: DC

## 2014-06-02 NOTE — Telephone Encounter (Signed)
Emily Cannon, Emily Cannon is requesting re-fill on levothyroxine (SYNTHROID, LEVOTHROID) 50 MCG tablet

## 2014-06-02 NOTE — Telephone Encounter (Signed)
Rx sent to pharmacy   

## 2014-06-17 ENCOUNTER — Ambulatory Visit (INDEPENDENT_AMBULATORY_CARE_PROVIDER_SITE_OTHER): Payer: Medicare Other | Admitting: Family Medicine

## 2014-06-17 ENCOUNTER — Encounter: Payer: Self-pay | Admitting: Family Medicine

## 2014-06-17 VITALS — BP 172/70 | HR 72 | Temp 97.9°F | Wt 137.0 lb

## 2014-06-17 DIAGNOSIS — I10 Essential (primary) hypertension: Secondary | ICD-10-CM

## 2014-06-17 MED ORDER — SPIRONOLACTONE 25 MG PO TABS: 25.0000 mg | ORAL_TABLET | Freq: Every day | ORAL | Status: DC

## 2014-06-17 NOTE — Progress Notes (Signed)
   Subjective:    Patient ID: Emily Cannon, female    DOB: Jun 05, 1933, 80 y.o.   MRN: [id number]  HPI Patient seen for follow-up hypertension. This has been very challenging to control. She's had previous possible angioedema type reaction to angiotensin receptor blocker so avoided ace inhibitors and ARBs. She is currently maintained on hydralazine, metoprolol, HCTZ, and amlodipine. She recently went to ENT surgeon had initial systolic [id number] and subsequent readings [id number] / 60 systolic. She's had several home readings over Q000111Q systolic recently. Diastolics have generally been well controlled. No headaches. She is quite anxious. No chest pains. No peripheral edema issues. Normal renal function by recent labs. Normal electrolytes. States she is compliant with medications. No nonsteroidals. No alcohol.  Past Medical History  Diagnosis Date  . Allergic rhinitis, cause unspecified   . Perforation of tympanic membrane, unspecified   . Unspecified essential hypertension   . Other and unspecified hyperlipidemia   . Irritable bowel syndrome   . Acquired absence of organ, genital organs   . Lumbago   . Disorder of bone and cartilage, unspecified   . Anxiety state, unspecified    Past Surgical History  Procedure Laterality Date  . Abdominal hysterectomy    . Cataract surgery  5/12 and 12/24/2012    both eyes    reports that she has never smoked. She has never used smokeless tobacco. She reports that she does not drink alcohol or use illicit drugs. family history includes COPD in her other; Lung cancer (age of onset: 44) in her mother; Lung disease (age of onset: 86) in her father. There is no history of Colon cancer. Allergies  Allergen Reactions  . Alendronate Sodium     REACTION: pt states "short of breath"  . Amoxicillin     REACTION: causes severe diarrhea  . Codeine     REACTION: pt states nausea  . Valsartan     REACTION: gums/throat swelling      Review of Systems  Constitutional:  Negative for fatigue.  Eyes: Negative for visual disturbance.  Respiratory: Negative for cough, chest tightness, shortness of breath and wheezing.   Cardiovascular: Negative for chest pain, palpitations and leg swelling.  Neurological: Negative for dizziness, seizures, syncope, weakness, light-headedness and headaches.       Objective:   Physical Exam  Constitutional: She appears well-developed and well-nourished.  Neck: Neck supple. No thyromegaly present.  Cardiovascular: Normal rate and regular rhythm.   Pulmonary/Chest: Effort normal and breath sounds normal. No respiratory distress. She has no wheezes. She has no rales.  Musculoskeletal: She exhibits no edema.          Assessment & Plan:  Hypertension. Poorly controlled. Repeat blood pressure left arm seated 170/70 and right arm seated 172/70. Continue current medications. Add spirinolactone 25 mg once daily. Reassess blood pressure follow-up 2 weeks. Emphasize low-sodium diet

## 2014-06-17 NOTE — Progress Notes (Signed)
Pre visit review using our clinic review tool, if applicable. No additional management support is needed unless otherwise documented below in the visit note. 

## 2014-06-29 ENCOUNTER — Ambulatory Visit (INDEPENDENT_AMBULATORY_CARE_PROVIDER_SITE_OTHER): Payer: Medicare Other | Admitting: Family Medicine

## 2014-06-29 ENCOUNTER — Encounter: Payer: Self-pay | Admitting: Family Medicine

## 2014-06-29 VITALS — BP 150/70 | HR 74 | Temp 98.2°F | Wt 135.0 lb

## 2014-06-29 DIAGNOSIS — I10 Essential (primary) hypertension: Secondary | ICD-10-CM

## 2014-06-29 MED ORDER — HYDRALAZINE HCL 50 MG PO TABS: 50.0000 mg | ORAL_TABLET | Freq: Two times a day (BID) | ORAL | Status: DC

## 2014-06-29 NOTE — Progress Notes (Signed)
Pre visit review using our clinic review tool, if applicable. No additional management support is needed unless otherwise documented below in the visit note. 

## 2014-06-29 NOTE — Progress Notes (Signed)
   Subjective:    Patient ID: Emily Cannon, female    DOB: [birth year], 80 y.o.   MRN: [id number]  HPI Here for follow-up hypertension. She's had resistant hypertension currently on 5 drug regimen. She's had previous angioedema with angiotensin receptor blocker so we've been reluctant to use that or ACE inhibitor. We did add spirinolactone last visit and she's had no side effects. By her readings she is getting mostly [id number] systolic. No headaches. No chest pains. No dizziness. No peripheral edema issues. She has history of normal renal function. She states she is compliant with all medications. No excessive ETOH or other obvious exacerbating factors.  Past Medical History  Diagnosis Date  . Allergic rhinitis, cause unspecified   . Perforation of tympanic membrane, unspecified   . Unspecified essential hypertension   . Other and unspecified hyperlipidemia   . Irritable bowel syndrome   . Acquired absence of organ, genital organs   . Lumbago   . Disorder of bone and cartilage, unspecified   . Anxiety state, unspecified    Past Surgical History  Procedure Laterality Date  . Abdominal hysterectomy    . Cataract surgery  5/12 and 12/24/2012    both eyes    reports that she has never smoked. She has never used smokeless tobacco. She reports that she does not drink alcohol or use illicit drugs. family history includes COPD in her other; Lung cancer (age of onset: 61) in her mother; Lung disease (age of onset: 54) in her father. There is no history of Colon cancer. Allergies  Allergen Reactions  . Alendronate Sodium     REACTION: pt states "short of breath"  . Amoxicillin     REACTION: causes severe diarrhea  . Codeine     REACTION: pt states nausea  . Valsartan     REACTION: gums/throat swelling      Review of Systems  Constitutional: Negative for fever and chills.  Eyes: Negative for visual disturbance.  Respiratory: Negative for cough and shortness of breath.   Cardiovascular:  Negative for chest pain.  Genitourinary: Negative for dysuria.  Neurological: Negative for weakness and headaches.       Objective:   Physical Exam  Constitutional: She appears well-developed and well-nourished.  Neck: Neck supple. No JVD present. No thyromegaly present.  Cardiovascular: Normal rate and regular rhythm.   Pulmonary/Chest: Effort normal and breath sounds normal. No respiratory distress. She has no wheezes. She has no rales.  Musculoskeletal: She exhibits no edema.  Neurological: She is alert.          Assessment & Plan:  Hypertension. This has been resistant to treatment. She has had some improvement with Spironolactone- but still not to goal with systolics > 0000000. Check basic metabolic panel. Increase hydralazine to 50 mg BID. We explained this is frequently given as a 3 times a day or 4 times a day drug. She is reluctant to take > twice a day. Follow-up in 3 weeks to reassess. Set up renal duplex scan to rule out renal artery stenosis .continue with low sodium diet and regular aerobic exercise with walking.

## 2014-06-30 LAB — BASIC METABOLIC PANEL
BUN: 14 mg/dL (ref 6–23)
CHLORIDE: 102 meq/L (ref 96–112)
CO2: 25 mEq/L (ref 19–32)
Calcium: 9.6 mg/dL (ref 8.4–10.5)
Creatinine, Ser: 0.8 mg/dL (ref 0.4–1.2)
GFR: 72.48 mL/min (ref >60.00)
Glucose, Bld: 112 mg/dL — ABNORMAL HIGH (ref 70–99)
POTASSIUM: 3.9 meq/L (ref 3.5–5.1)
Sodium: 137 mEq/L (ref 135–145)

## 2014-07-08 ENCOUNTER — Ambulatory Visit (HOSPITAL_COMMUNITY): Payer: Medicare Other | Attending: Family Medicine | Admitting: Cardiology

## 2014-07-08 DIAGNOSIS — E785 Hyperlipidemia, unspecified: Secondary | ICD-10-CM | POA: Insufficient documentation

## 2014-07-08 DIAGNOSIS — I10 Essential (primary) hypertension: Secondary | ICD-10-CM

## 2014-07-08 DIAGNOSIS — Z87891 Personal history of nicotine dependence: Secondary | ICD-10-CM | POA: Insufficient documentation

## 2014-07-08 NOTE — Progress Notes (Signed)
Renal artery duplex performed  

## 2014-07-18 ENCOUNTER — Telehealth: Payer: Self-pay

## 2014-07-18 NOTE — Telephone Encounter (Signed)
Last visit 06/29/14 Last refill 11/03/13 #90 5 refill

## 2014-07-18 NOTE — Telephone Encounter (Signed)
Rx request for alprazolam 0.25 mg tab- Take 1 tablet by mouth three times daily as needed for anxiety #90  Pharm:  [place].   Pls advise.

## 2014-07-18 NOTE — Telephone Encounter (Signed)
Refill OK.  Would be preferable if she can scale back frequency of dosing gradually over time.

## 2014-07-19 MED ORDER — ALPRAZOLAM 0.25 MG PO TABS: 0.2500 mg | ORAL_TABLET | Freq: Three times a day (TID) | ORAL | Status: DC | PRN

## 2014-07-19 NOTE — Telephone Encounter (Signed)
Rx called in to pharmacy. 

## 2014-07-20 ENCOUNTER — Ambulatory Visit (INDEPENDENT_AMBULATORY_CARE_PROVIDER_SITE_OTHER): Payer: Medicare Other | Admitting: Family Medicine

## 2014-07-20 ENCOUNTER — Encounter: Payer: Self-pay | Admitting: Family Medicine

## 2014-07-20 VITALS — BP 152/78 | HR 71 | Temp 97.9°F | Wt 134.0 lb

## 2014-07-20 DIAGNOSIS — I10 Essential (primary) hypertension: Secondary | ICD-10-CM

## 2014-07-20 MED ORDER — ALPRAZOLAM 0.25 MG PO TABS: 0.2500 mg | ORAL_TABLET | Freq: Every evening | ORAL | Status: DC | PRN

## 2014-07-20 NOTE — Patient Instructions (Signed)
DASH Eating Plan °DASH stands for "Dietary Approaches to Stop Hypertension." The DASH eating plan is a healthy eating plan that has been shown to reduce high blood pressure (hypertension). Additional health benefits may include reducing the risk of type 2 diabetes mellitus, heart disease, and stroke. The DASH eating plan may also help with weight loss. °WHAT DO I NEED TO KNOW ABOUT THE DASH EATING PLAN? °For the DASH eating plan, you will follow these general guidelines: °· Choose foods with a percent daily value for sodium of less than 5% (as listed on the food label). °· Use salt-free seasonings or herbs instead of table salt or sea salt. °· Check with your health care provider or pharmacist before using salt substitutes. °· Eat lower-sodium products, often labeled as "lower sodium" or "no salt added." °· Eat fresh foods. °· Eat more vegetables, fruits, and low-fat dairy products. °· Choose whole grains. Look for the word "whole" as the first word in the ingredient list. °· Choose fish and skinless chicken or turkey more often than red meat. Limit fish, poultry, and meat to 6 oz (170 g) each day. °· Limit sweets, desserts, sugars, and sugary drinks. °· Choose heart-healthy fats. °· Limit cheese to 1 oz (28 g) per day. °· Eat more home-cooked food and less restaurant, buffet, and fast food. °· Limit fried foods. °· Cook foods using methods other than frying. °· Limit canned vegetables. If you do use them, rinse them well to decrease the sodium. °· When eating at a restaurant, ask that your food be prepared with less salt, or no salt if possible. °WHAT FOODS CAN I EAT? °Seek help from a dietitian for individual calorie needs. °Grains °Whole grain or whole wheat bread. Brown rice. Whole grain or whole wheat pasta. Quinoa, bulgur, and whole grain cereals. Low-sodium cereals. Corn or whole wheat flour tortillas. Whole grain cornbread. Whole grain crackers. Low-sodium crackers. °Vegetables °Fresh or frozen vegetables  (raw, steamed, roasted, or grilled). Low-sodium or reduced-sodium tomato and vegetable juices. Low-sodium or reduced-sodium tomato sauce and paste. Low-sodium or reduced-sodium canned vegetables.  °Fruits °All fresh, canned (in natural juice), or frozen fruits. °Meat and Other Protein Products °Ground beef (85% or leaner), grass-fed beef, or beef trimmed of fat. Skinless chicken or turkey. Ground chicken or turkey. Pork trimmed of fat. All fish and seafood. Eggs. Dried beans, peas, or lentils. Unsalted nuts and seeds. Unsalted canned beans. °Dairy °Low-fat dairy products, such as skim or 1% milk, 2% or reduced-fat cheeses, low-fat ricotta or cottage cheese, or plain low-fat yogurt. Low-sodium or reduced-sodium cheeses. °Fats and Oils °Tub margarines without trans fats. Light or reduced-fat mayonnaise and salad dressings (reduced sodium). Avocado. Safflower, olive, or canola oils. Natural peanut or almond butter. °Other °Unsalted popcorn and pretzels. °The items listed above may not be a complete list of recommended foods or beverages. Contact your dietitian for more options. °WHAT FOODS ARE NOT RECOMMENDED? °Grains °White bread. White pasta. White rice. Refined cornbread. Bagels and croissants. Crackers that contain trans fat. °Vegetables °Creamed or fried vegetables. Vegetables in a cheese sauce. Regular canned vegetables. Regular canned tomato sauce and paste. Regular tomato and vegetable juices. °Fruits °Dried fruits. Canned fruit in light or heavy syrup. Fruit juice. °Meat and Other Protein Products °Fatty cuts of meat. Ribs, chicken wings, bacon, sausage, bologna, salami, chitterlings, fatback, hot dogs, bratwurst, and packaged luncheon meats. Salted nuts and seeds. Canned beans with salt. °Dairy °Whole or 2% milk, cream, half-and-half, and cream cheese. Whole-fat or sweetened yogurt. Full-fat   cheeses or blue cheese. Nondairy creamers and whipped toppings. Processed cheese, cheese spreads, or cheese  curds. °Condiments °Onion and garlic salt, seasoned salt, table salt, and sea salt. Canned and packaged gravies. Worcestershire sauce. Tartar sauce. Barbecue sauce. Teriyaki sauce. Soy sauce, including reduced sodium. Steak sauce. Fish sauce. Oyster sauce. Cocktail sauce. Horseradish. Ketchup and mustard. Meat flavorings and tenderizers. Bouillon cubes. Hot sauce. Tabasco sauce. Marinades. Taco seasonings. Relishes. °Fats and Oils °Butter, stick margarine, lard, shortening, ghee, and bacon fat. Coconut, palm kernel, or palm oils. Regular salad dressings. °Other °Pickles and olives. Salted popcorn and pretzels. °The items listed above may not be a complete list of foods and beverages to avoid. Contact your dietitian for more information. °WHERE CAN I FIND MORE INFORMATION? °[employer], [employer], [employer]: www.nhlbi.nih.gov/health/health-topics/topics/dash/ °Document Released: 07/04/2011 Document Revised: 11/29/2013 Document Reviewed: 05/19/2013 °[employer]® Patient Information ©2015 [employer]. This information is not intended to replace advice given to you by your health care provider. Make sure you discuss any questions you have with your health care provider. ° °

## 2014-07-20 NOTE — Progress Notes (Signed)
   Subjective:    Patient ID: Emily Cannon, female    DOB: April 09, 1933, 80 y.o.   MRN: [id number]  HPI Patient here for follow-up regarding resistant hypertension. Refer to previous notes. We recently added Aldactone and also increased her hydralazine to 50 mg twice daily. Recent renal artery duplex scan revealed no aneurysm and normal flow through her renal arteries. She has improved by home readings. She is doing some deep breathing exercises and walking for exercise. No headaches. No dizziness. No chest pains. Compliant with all medications.  Past Medical History  Diagnosis Date  . Allergic rhinitis, cause unspecified   . Perforation of tympanic membrane, unspecified   . Unspecified essential hypertension   . Other and unspecified hyperlipidemia   . Irritable bowel syndrome   . Acquired absence of organ, genital organs   . Lumbago   . Disorder of bone and cartilage, unspecified   . Anxiety state, unspecified    Past Surgical History  Procedure Laterality Date  . Abdominal hysterectomy    . Cataract surgery  5/12 and 12/24/2012    both eyes    reports that she has never smoked. She has never used smokeless tobacco. She reports that she does not drink alcohol or use illicit drugs. family history includes COPD in her other; Lung cancer (age of onset: 74) in her mother; Lung disease (age of onset: 57) in her father. There is no history of Colon cancer. Allergies  Allergen Reactions  . Alendronate Sodium     REACTION: pt states "short of breath"  . Amoxicillin     REACTION: causes severe diarrhea  . Codeine     REACTION: pt states nausea  . Valsartan     REACTION: gums/throat swelling      Review of Systems  Constitutional: Negative for fatigue.  Eyes: Negative for visual disturbance.  Respiratory: Negative for cough, chest tightness, shortness of breath and wheezing.   Cardiovascular: Negative for chest pain, palpitations and leg swelling.  Neurological: Negative for  dizziness, seizures, syncope, weakness, light-headedness and headaches.       Objective:   Physical Exam  Constitutional: She appears well-developed and well-nourished.  Neck: Neck supple. No thyromegaly present.  Cardiovascular: Normal rate and regular rhythm.   Pulmonary/Chest: Effort normal and breath sounds normal. No respiratory distress. She has no wheezes. She has no rales.  Musculoskeletal: She exhibits no edema.          Assessment & Plan:  Hypertension which has been resistant. Improved. Continue current medications. Information on DASH diet given. Aerobic exercise with walking as much as possible. Reassess 3 months

## 2014-07-20 NOTE — Progress Notes (Signed)
Pre visit review using our clinic review tool, if applicable. No additional management support is needed unless otherwise documented below in the visit note. 

## 2014-08-02 ENCOUNTER — Encounter: Payer: Self-pay | Admitting: Family Medicine

## 2014-08-15 ENCOUNTER — Other Ambulatory Visit: Payer: Self-pay

## 2014-08-15 MED ORDER — METOPROLOL SUCCINATE ER 50 MG PO TB24: ORAL_TABLET | ORAL | Status: DC

## 2014-09-20 ENCOUNTER — Other Ambulatory Visit: Payer: Self-pay | Admitting: Family Medicine

## 2014-10-19 ENCOUNTER — Ambulatory Visit (INDEPENDENT_AMBULATORY_CARE_PROVIDER_SITE_OTHER): Payer: Medicare Other | Admitting: Family Medicine

## 2014-10-19 ENCOUNTER — Encounter: Payer: Self-pay | Admitting: Family Medicine

## 2014-10-19 VITALS — BP 140/74 | HR 71 | Temp 98.4°F | Wt 137.0 lb

## 2014-10-19 DIAGNOSIS — I10 Essential (primary) hypertension: Secondary | ICD-10-CM | POA: Diagnosis not present

## 2014-10-19 NOTE — Progress Notes (Signed)
Pre visit review using our clinic review tool, if applicable. No additional management support is needed unless otherwise documented below in the visit note. 

## 2014-10-19 NOTE — Patient Instructions (Signed)
Potassium Content of Foods  Potassium is a mineral found in many foods and drinks. It helps keep fluids and minerals balanced in your body and affects how steadily your heart beats. Potassium also helps control your blood pressure and keep your muscles and nervous system healthy.  Certain health conditions and medicines may change the balance of potassium in your body. When this happens, you can help balance your level of potassium through the foods that you do or do not eat. Your health care provider or dietitian may recommend an amount of potassium that you should have each day. The following lists of foods provide the amount of potassium (in parentheses) per serving in each item.  HIGH IN POTASSIUM   The following foods and beverages have 200 mg or more of potassium per serving:  · Apricots, 2 raw or 5 dry (200 mg).  · Artichoke, 1 medium (345 mg).  · Avocado, raw,  ¼ each (245 mg).  · Banana, 1 medium (425 mg).  · Beans, lima, or baked beans, canned, ½ cup (280 mg).  · Beans, white, canned, ½ cup (595 mg).  · Beef roast, 3 oz (320 mg).  · Beef, ground, 3 oz (270 mg).  · Beets, raw or cooked, ½ cup (260 mg).  · Bran muffin, 2 oz (300 mg).  · Broccoli, ½ cup (230 mg).  · Brussels sprouts, ½ cup (250 mg).  · Cantaloupe, ½ cup (215 mg).  · Cereal, 100% bran, ½ cup (200-400 mg).  · Cheeseburger, single, fast food, 1 each (225-400 mg).  · Chicken, 3 oz (220 mg).  · Clams, canned, 3 oz (535 mg).  · Crab, 3 oz (225 mg).  · Dates, 5 each (270 mg).  · Dried beans and peas, ½ cup (300-475 mg).  · Figs, dried, 2 each (260 mg).  · Fish: halibut, tuna, cod, snapper, 3 oz (480 mg).  · Fish: salmon, haddock, swordfish, perch, 3 oz (300 mg).  · Fish, tuna, canned 3 oz (200 mg).  · French fries, fast food, 3 oz (470 mg).  · Granola with fruit and nuts, ½ cup (200 mg).  · Grapefruit juice, ½ cup (200 mg).  · Greens, beet, ½ cup (655 mg).  · Honeydew melon, ½ cup (200 mg).  · Kale, raw, 1 cup (300 mg).  · Kiwi, 1 medium (240  mg).  · Kohlrabi, rutabaga, parsnips, ½ cup (280 mg).  · Lentils, ½ cup (365 mg).  · Mango, 1 each (325 mg).  · Milk, chocolate, 1 cup (420 mg).  · Milk: nonfat, low-fat, whole, buttermilk, 1 cup (350-380 mg).  · Molasses, 1 Tbsp (295 mg).  · Mushrooms, ½ cup (280) mg.  · Nectarine, 1 each (275 mg).  · Nuts: almonds, peanuts, hazelnuts, [place], cashew, mixed, 1 oz (200 mg).  · Nuts, pistachios, 1 oz (295 mg).  · Orange, 1 each (240 mg).  · Orange juice, ½ cup (235 mg).  · Papaya, medium, ½ fruit (390 mg).  · Peanut butter, chunky, 2 Tbsp (240 mg).  · Peanut butter, smooth, 2 Tbsp (210 mg).  · Pear, 1 medium (200 mg).  · Pomegranate, 1 whole (400 mg).  · Pomegranate juice, ½ cup (215 mg).  · Pork, 3 oz (350 mg).  · Potato chips, salted, 1 oz (465 mg).  · Potato, baked with skin, 1 medium (925 mg).  · Potatoes, boiled, ½ cup (255 mg).  · Potatoes, mashed, ½ cup (330 mg).  · Prune juice, ½ cup (  370 mg).  · Prunes, 5 each (305 mg).  · Pudding, chocolate, ½ cup (230 mg).  · Pumpkin, canned, ½ cup (250 mg).  · Raisins, seedless, ¼ cup (270 mg).  · Seeds, sunflower or pumpkin, 1 oz (240 mg).  · Soy milk, 1 cup (300 mg).  · Spinach, ½ cup (420 mg).  · Spinach, canned, ½ cup (370 mg).  · Sweet potato, baked with skin, 1 medium (450 mg).  · Swiss chard, ½ cup (480 mg).  · Tomato or vegetable juice, ½ cup (275 mg).  · Tomato sauce or puree, ½ cup (400-550 mg).  · Tomato, raw, 1 medium (290 mg).  · Tomatoes, canned, ½ cup (200-300 mg).  · Turkey, 3 oz (250 mg).  · Wheat germ, 1 oz (250 mg).  · Winter squash, ½ cup (250 mg).  · Yogurt, plain or fruited, 6 oz (260-435 mg).  · Zucchini, ½ cup (220 mg).  MODERATE IN POTASSIUM  The following foods and beverages have 50-200 mg of potassium per serving:  · Apple, 1 each (150 mg).  · Apple juice, ½ cup (150 mg).  · Applesauce, ½ cup (90 mg).  · Apricot nectar, ½ cup (140 mg).  · Asparagus, small spears, ½ cup or 6 spears (155 mg).  · Bagel, cinnamon raisin, 1 each (130 mg).  · Bagel,  egg or plain, 4 in., 1 each (70 mg).  · Beans, green, ½ cup (90 mg).  · Beans, yellow, ½ cup (190 mg).  · Beer, regular, 12 oz (100 mg).  · Beets, canned, ½ cup (125 mg).  · Blackberries, ½ cup (115 mg).  · Blueberries, ½ cup (60 mg).  · Bread, whole wheat, 1 slice (70 mg).  · Broccoli, raw, ½ cup (145 mg).  · Cabbage, ½ cup (150 mg).  · Carrots, cooked or raw, ½ cup (180 mg).  · Cauliflower, raw, ½ cup (150 mg).  · Celery, raw, ½ cup (155 mg).  · Cereal, bran flakes, ½cup (120-150 mg).  · Cheese, cottage, ½ cup (110 mg).  · Cherries, 10 each (150 mg).  · Chocolate, 1½ oz bar (165 mg).  · Coffee, brewed 6 oz (90 mg).  · Corn, ½ cup or 1 ear (195 mg).  · Cucumbers, ½ cup (80 mg).  · Egg, large, 1 each (60 mg).  · Eggplant, ½ cup (60 mg).  · Endive, raw, ½cup (80 mg).  · English muffin, 1 each (65 mg).  · Fish, orange roughy, 3 oz (150 mg).  · Frankfurter, beef or pork, 1 each (75 mg).  · Fruit cocktail, ½ cup (115 mg).  · Grape juice, ½ cup (170 mg).  · Grapefruit, ½ fruit (175 mg).  · Grapes, ½ cup (155 mg).  · Greens: kale, turnip, collard, ½ cup (110-150 mg).  · Ice cream or frozen yogurt, chocolate, ½ cup (175 mg).  · Ice cream or frozen yogurt, vanilla, ½ cup (120-150 mg).  · Lemons, limes, 1 each (80 mg).  · Lettuce, all types, 1 cup (100 mg).  · Mixed vegetables, ½ cup (150 mg).  · Mushrooms, raw, ½ cup (110 mg).  · Nuts: walnuts, pecans, or macadamia, 1 oz (125 mg).  · Oatmeal, ½ cup (80 mg).  · Okra, ½ cup (110 mg).  · Onions, raw, ½ cup (120 mg).  · Peach, 1 each (185 mg).  · Peaches, canned, ½ cup (120 mg).  · Pears, canned, ½ cup (120 mg).  · Peas, green,   frozen, ½ cup (90 mg).  · Peppers, green, ½ cup (130 mg).  · Peppers, red, ½ cup (160 mg).  · Pineapple juice, ½ cup (165 mg).  · Pineapple, fresh or canned, ½ cup (100 mg).  · Plums, 1 each (105 mg).  · Pudding, vanilla, ½ cup (150 mg).  · Raspberries, ½ cup (90 mg).  · Rhubarb, ½ cup (115 mg).  · Rice, wild, ½ cup (80 mg).  · Shrimp, 3 oz (155  mg).  · Spinach, raw, 1 cup (170 mg).  · Strawberries, ½ cup (125 mg).  · Summer squash ½ cup (175-200 mg).  · Swiss chard, raw, 1 cup (135 mg).  · Tangerines, 1 each (140 mg).  · Tea, brewed, 6 oz (65 mg).  · Turnips, ½ cup (140 mg).  · Watermelon, ½ cup (85 mg).  · Wine, red, table, 5 oz (180 mg).  · Wine, white, table, 5 oz (100 mg).  LOW IN POTASSIUM  The following foods and beverages have less than 50 mg of potassium per serving.  · Bread, white, 1 slice (30 mg).  · Carbonated beverages, 12 oz (less than 5 mg).  · Cheese, 1 oz (20-30 mg).  · Cranberries, ½ cup (45 mg).  · Cranberry juice cocktail, ½ cup (20 mg).  · Fats and oils, 1 Tbsp (less than 5 mg).  · Hummus, 1 Tbsp (32 mg).  · Nectar: papaya, mango, or pear, ½ cup (35 mg).  · Rice, white or brown, ½ cup (50 mg).  · Spaghetti or macaroni, ½ cup cooked (30 mg).  · Tortilla, flour or corn, 1 each (50 mg).  · Waffle, 4 in., 1 each (50 mg).  · Water chestnuts, ½ cup (40 mg).  Document Released: 02/26/2005 Document Revised: 07/20/2013 Document Reviewed: 06/11/2013  [employer]® Patient Information ©2015 [employer]. This information is not intended to replace advice given to you by your health care provider. Make sure you discuss any questions you have with your health care provider.

## 2014-10-19 NOTE — Progress Notes (Signed)
   Subjective:    Patient ID: Emily Cannon, female    DOB: 1932-11-20, 81 y.o.   MRN: [id number]  HPI Patient seen for follow-up hypertension. She's had resistant hypertension. Currently takes regimen of amlodipine, HCTZ, hydralazine, Aldactone, and Toprol. Blood pressures have been stable. She had angioedema with ACE inhibitor so we are avoiding that class and angiotensin receptor blockers. No headaches. No dizziness. No chest pains. No peripheral edema. Blood pressures at home mostly [id number] systolic and [id number] diastolic. Recent electrolytes stable.  Past Medical History  Diagnosis Date  . Allergic rhinitis, cause unspecified   . Perforation of tympanic membrane, unspecified   . Unspecified essential hypertension   . Other and unspecified hyperlipidemia   . Irritable bowel syndrome   . Acquired absence of organ, genital organs   . Lumbago   . Disorder of bone and cartilage, unspecified   . Anxiety state, unspecified    Past Surgical History  Procedure Laterality Date  . Abdominal hysterectomy    . Cataract surgery  5/12 and 12/24/2012    both eyes    reports that she has never smoked. She has never used smokeless tobacco. She reports that she does not drink alcohol or use illicit drugs. family history includes COPD in her other; Lung cancer (age of onset: 64) in her mother; Lung disease (age of onset: 44) in her father. There is no history of Colon cancer. Allergies  Allergen Reactions  . Alendronate Sodium     REACTION: pt states "short of breath"  . Amoxicillin     REACTION: causes severe diarrhea  . Codeine     REACTION: pt states nausea  . Valsartan     REACTION: gums/throat swelling      Review of Systems  Constitutional: Negative for fatigue.  Eyes: Negative for visual disturbance.  Respiratory: Negative for cough, chest tightness, shortness of breath and wheezing.   Cardiovascular: Negative for chest pain, palpitations and leg swelling.  Neurological: Negative  for dizziness, seizures, syncope, weakness, light-headedness and headaches.       Objective:   Physical Exam  Constitutional: She appears well-developed and well-nourished. No distress.  Neck: Neck supple. No JVD present.  Cardiovascular: Normal rate and regular rhythm.   Pulmonary/Chest: Effort normal and breath sounds normal. No respiratory distress. She has no wheezes. She has no rales.  Musculoskeletal: She exhibits no edema.          Assessment & Plan:  Resistant hypertension. Stable on 5 drug regimen. Dietary potassium in moderation with her Aldactone therapy. Continue current medication. Continue walking for exercise. Continue sodium reduction. Reassess 3 months and basic metabolic panel then

## 2014-10-23 ENCOUNTER — Other Ambulatory Visit: Payer: Self-pay | Admitting: Family Medicine

## 2014-11-15 ENCOUNTER — Other Ambulatory Visit: Payer: Self-pay | Admitting: Family Medicine

## 2014-12-03 ENCOUNTER — Other Ambulatory Visit: Payer: Self-pay | Admitting: Family Medicine

## 2014-12-19 DIAGNOSIS — H6123 Impacted cerumen, bilateral: Secondary | ICD-10-CM | POA: Diagnosis not present

## 2014-12-19 DIAGNOSIS — H903 Sensorineural hearing loss, bilateral: Secondary | ICD-10-CM | POA: Diagnosis not present

## 2014-12-20 ENCOUNTER — Ambulatory Visit (INDEPENDENT_AMBULATORY_CARE_PROVIDER_SITE_OTHER): Payer: Medicare Other | Admitting: Family Medicine

## 2014-12-20 ENCOUNTER — Encounter: Payer: Self-pay | Admitting: Family Medicine

## 2014-12-20 VITALS — BP 132/72 | HR 84 | Temp 98.2°F | Ht 61.0 in | Wt 135.5 lb

## 2014-12-20 DIAGNOSIS — F411 Generalized anxiety disorder: Secondary | ICD-10-CM

## 2014-12-20 DIAGNOSIS — I10 Essential (primary) hypertension: Secondary | ICD-10-CM | POA: Diagnosis not present

## 2014-12-20 NOTE — Progress Notes (Signed)
HPI:  Emily Cannon is a 81 yo pt of Dr. Elease Hashimoto here for an acute visit for:  HTN: -hx of resistant BP and anxiety -on many meds:norvasc 10, hydrochlorthiazide 12.5, toprol xl 50 bid, spironolactone 25, hydralazine 50mg  bid asa, statin -per PCP notes angioedema on acei. -reports: she gets very anxious when she comes to the doctor and usually has to take nerve pill to come to the doctor, but did not get to take her nerve pill. She reports her BP is always up when she sees her ear doctor and was up yesterday at her ear visit and they told her to follow up here as BP was elevated. -denies:CP, SOB, palpitation, HA, vision changes, swelling in legs -reports BP at home pretty good, but was up a little yesterday - reports has been having increased stress due to a girlfriend whom calls her 7 times per day - takes xnanx once nightly   ROS: See pertinent positives and negatives per HPI.  Past Medical History  Diagnosis Date  . Allergic rhinitis, cause unspecified   . Perforation of tympanic membrane, unspecified   . Unspecified essential hypertension   . Other and unspecified hyperlipidemia   . Irritable bowel syndrome   . Acquired absence of organ, genital organs   . Lumbago   . Disorder of bone and cartilage, unspecified   . Anxiety state, unspecified     Past Surgical History  Procedure Laterality Date  . Abdominal hysterectomy    . Cataract surgery  5/12 and 12/24/2012    both eyes    Family History  Problem Relation Age of Onset  . Lung cancer Mother 59  . Lung disease Father 91  . COPD Other     sibling  . Colon cancer Neg Hx     History   Social History  . Marital Status: Married    Spouse Name: Jeneen Rinks  . Number of Children: 1  . Years of Education: N/A   Occupational History  . retired Research scientist (physical sciences)    Social History Main Topics  . Smoking status: Never Smoker   . Smokeless tobacco: Never Used  . Alcohol Use: No  . Drug Use: No  . Sexual Activity: Not on  file   Other Topics Concern  . None   Social History Narrative   Son is Doug Coble--pt here      Current outpatient prescriptions:  .  ALPRAZolam (XANAX) 0.25 MG tablet, Take 1 tablet (0.25 mg total) by mouth at bedtime as needed for anxiety., Disp: 90 tablet, Rfl: 1 .  amLODipine (NORVASC) 10 MG tablet, TAKE 1 TABLET (10 MG TOTAL) BY MOUTH DAILY., Disp: 30 tablet, Rfl: 2 .  aspirin 81 MG tablet, Take 81 mg by mouth every evening. , Disp: , Rfl:  .  atorvastatin (LIPITOR) 20 MG tablet, TAKE 1 TABLET (20 MG TOTAL) BY MOUTH DAILY., Disp: 30 tablet, Rfl: 4 .  diphenhydramine-acetaminophen (TYLENOL PM) 25-500 MG TABS, Take 1 tablet by mouth at bedtime as needed.  , Disp: , Rfl:  .  gemfibrozil (LOPID) 600 MG tablet, TAKE 1 TABLET (600 MG TOTAL) BY MOUTH DAILY., Disp: 30 tablet, Rfl: 3 .  hydrALAZINE (APRESOLINE) 50 MG tablet, Take 1 tablet (50 mg total) by mouth 2 (two) times daily., Disp: 60 tablet, Rfl: 11 .  hydrochlorothiazide (MICROZIDE) 12.5 MG capsule, Take 1 capsule (12.5 mg total) by mouth daily., Disp: 30 capsule, Rfl: 11 .  levothyroxine (SYNTHROID, LEVOTHROID) 50 MCG tablet, Take 1 tablet (50 mcg  total) by mouth daily before breakfast., Disp: 30 tablet, Rfl: 11 .  metoprolol succinate (TOPROL-XL) 50 MG 24 hr tablet, TAKE 1 TABLET (50 MG TOTAL) BY MOUTH 2 (TWO) TIMES DAILY., Disp: 60 tablet, Rfl: 5 .  spironolactone (ALDACTONE) 25 MG tablet, TAKE 1 TABLET (25 MG TOTAL) BY MOUTH DAILY., Disp: 30 tablet, Rfl: 5  EXAM:  Filed Vitals:   12/20/14 1415  BP: 132/72  Pulse: 84  Temp: 98.2 F (36.8 C)    Body mass index is 25.62 kg/(m^2).  GENERAL: vitals reviewed and listed above, alert, oriented, appears well hydrated and in no acute distress  HEENT: atraumatic, conjunttiva clear, no obvious abnormalities on inspection of external nose and ears  NECK: no obvious masses on inspection  LUNGS: clear to auscultation bilaterally, no wheezes, rales or rhonchi, good air  movement  CV: HRRR, no peripheral edema  MS: moves all extremities without noticeable abnormality  PSYCH: pleasant and cooperative, no obvious depression or anxiety  ASSESSMENT AND PLAN:  Discussed the following assessment and plan:  Essential hypertension -BP good on recheck here today after sitting, she reports chronic white coat syndrome -cont to monitor -has follow up with PCP in a few weeks, would recheck then  Anxiety state -discussed options -she may consider Dr. Glennon Hamilton -she will follow up with her PCP as scheduled and may also consider SSRI - she is somewhat reluctant about doing more medications  As she was leaving she mentioned some mild L post neck pain, intermittent for 3-4 month, not worsening, no radiation, HA, weakness, numbness. On brief exam normal ROM head and neg, neg spurling, does have some L post cervical paraspinal muscle tension. Advise isometric gentle home exercises and follow up at scheduled visit with PCP. May need imaging if persists, advised follow up sooner if worsening.   -Patient advised to return or notify a doctor immediately if symptoms worsen or persist or new concerns arise.  Patient Instructions  BEFORE YOU LEAVE: -brochure for Dr. Glennon Hamilton  Follow up with Dr. Elease Hashimoto as scheduled to recheck blood pressure  Consider seeing Dr. Glennon Hamilton for help with the stress and anxiety     Laquenta Whitsell, [facility].

## 2014-12-20 NOTE — Patient Instructions (Signed)
BEFORE YOU LEAVE: -brochure for Dr. Glennon Hamilton  Follow up with Dr. Elease Hashimoto as scheduled to recheck blood pressure  Consider seeing Dr. Glennon Hamilton for help with the stress and anxiety

## 2014-12-20 NOTE — Progress Notes (Signed)
Pre visit review using our clinic review tool, if applicable. No additional management support is needed unless otherwise documented below in the visit note. 

## 2015-01-06 ENCOUNTER — Other Ambulatory Visit: Payer: Self-pay | Admitting: Family Medicine

## 2015-01-08 NOTE — Telephone Encounter (Signed)
Refill with one additional refill. 

## 2015-01-18 ENCOUNTER — Encounter: Payer: Self-pay | Admitting: Family Medicine

## 2015-01-18 ENCOUNTER — Ambulatory Visit (INDEPENDENT_AMBULATORY_CARE_PROVIDER_SITE_OTHER): Payer: Medicare Other | Admitting: Family Medicine

## 2015-01-18 VITALS — BP 136/74 | HR 69 | Temp 98.1°F | Wt 134.0 lb

## 2015-01-18 DIAGNOSIS — I10 Essential (primary) hypertension: Secondary | ICD-10-CM | POA: Diagnosis not present

## 2015-01-18 LAB — BASIC METABOLIC PANEL
BUN: 16 mg/dL (ref 6–23)
CHLORIDE: 100 meq/L (ref 96–112)
CO2: 26 meq/L (ref 19–32)
Calcium: 9.8 mg/dL (ref 8.4–10.5)
Creatinine, Ser: 0.79 mg/dL (ref 0.40–1.20)
GFR: 74.5 mL/min (ref >60.00)
Glucose, Bld: 130 mg/dL — ABNORMAL HIGH (ref 70–99)
Potassium: 3.7 mEq/L (ref 3.5–5.1)
SODIUM: 135 meq/L (ref 135–145)

## 2015-01-18 NOTE — Progress Notes (Signed)
Pre visit review using our clinic review tool, if applicable. No additional management support is needed unless otherwise documented below in the visit note. 

## 2015-01-18 NOTE — Progress Notes (Signed)
   Subjective:    Patient ID: Emily Cannon, female    DOB: 09-21-1932, 81 y.o.   MRN: [id number]  HPI  follow-up hypertension. On multidrug regimen. Blood pressure finally stable by home readings. No headaches. No dizziness. Denies side effects from medication other than possibly some fatigue issues. We added Aldactone and that's when her blood pressure seemed to turn a corner for the better.  Past Medical History  Diagnosis Date  . Allergic rhinitis, cause unspecified   . Perforation of tympanic membrane, unspecified   . Unspecified essential hypertension   . Other and unspecified hyperlipidemia   . Irritable bowel syndrome   . Acquired absence of organ, genital organs   . Lumbago   . Disorder of bone and cartilage, unspecified   . Anxiety state, unspecified    Past Surgical History  Procedure Laterality Date  . Abdominal hysterectomy    . Cataract surgery  5/12 and 12/24/2012    both eyes    reports that she has never smoked. She has never used smokeless tobacco. She reports that she does not drink alcohol or use illicit drugs. family history includes COPD in her other; Lung cancer (age of onset: 51) in her mother; Lung disease (age of onset: 38) in her father. There is no history of Colon cancer. Allergies  Allergen Reactions  . Alendronate Sodium     REACTION: pt states "short of breath"  . Amoxicillin     REACTION: causes severe diarrhea  . Codeine     REACTION: pt states nausea  . Valsartan     REACTION: gums/throat swelling      Review of Systems  Constitutional: Positive for fatigue. Negative for appetite change and unexpected weight change.  Eyes: Negative for visual disturbance.  Respiratory: Negative for cough, chest tightness, shortness of breath and wheezing.   Cardiovascular: Negative for chest pain, palpitations and leg swelling.  Neurological: Negative for dizziness, seizures, syncope, weakness, light-headedness and headaches.       Objective:   Physical Exam  Constitutional: She appears well-developed and well-nourished.  Neck: Neck supple. No JVD present.  Cardiovascular: Normal rate and regular rhythm.   Pulmonary/Chest: Effort normal and breath sounds normal. No respiratory distress. She has no wheezes. She has no rales.  Musculoskeletal: She exhibits no edema.          Assessment & Plan:   Hypertension. Stable and improved. Check basic metabolic panel. Continue close monitoring

## 2015-01-23 ENCOUNTER — Other Ambulatory Visit: Payer: Self-pay | Admitting: *Deleted

## 2015-01-23 MED ORDER — HYDROCHLOROTHIAZIDE 12.5 MG PO CAPS: 12.5000 mg | ORAL_CAPSULE | Freq: Every day | ORAL | Status: DC

## 2015-02-03 ENCOUNTER — Other Ambulatory Visit: Payer: Self-pay | Admitting: Family Medicine

## 2015-02-06 ENCOUNTER — Encounter: Payer: Self-pay | Admitting: Family Medicine

## 2015-02-06 ENCOUNTER — Ambulatory Visit (INDEPENDENT_AMBULATORY_CARE_PROVIDER_SITE_OTHER): Payer: Medicare Other | Admitting: Family Medicine

## 2015-02-06 VITALS — BP 138/74 | HR 76 | Temp 97.6°F | Wt 136.0 lb

## 2015-02-06 DIAGNOSIS — M778 Other enthesopathies, not elsewhere classified: Secondary | ICD-10-CM | POA: Diagnosis not present

## 2015-02-06 DIAGNOSIS — M779 Enthesopathy, unspecified: Secondary | ICD-10-CM | POA: Diagnosis not present

## 2015-02-06 DIAGNOSIS — M542 Cervicalgia: Secondary | ICD-10-CM

## 2015-02-06 MED ORDER — METHYLPREDNISOLONE ACETATE 40 MG/ML IJ SUSP: 20.0000 mg | Freq: Once | INTRAMUSCULAR | Status: DC

## 2015-02-06 MED ORDER — METHYLPREDNISOLONE ACETATE 40 MG/ML IJ SUSP
40.0000 mg | Freq: Once | INTRAMUSCULAR | Status: AC
  Administered 2015-02-06: 40 mg via INTRA_ARTICULAR

## 2015-02-06 MED ORDER — METHOCARBAMOL 500 MG PO TABS: 500.0000 mg | ORAL_TABLET | Freq: Three times a day (TID) | ORAL | Status: DC | PRN

## 2015-02-06 NOTE — Progress Notes (Signed)
Pre visit review using our clinic review tool, if applicable. No additional management support is needed unless otherwise documented below in the visit note. 

## 2015-02-06 NOTE — Progress Notes (Signed)
Subjective:    Patient ID: Emily Cannon, female    DOB: [birth year], 81 y.o.   MRN: [id number]  HPI Patient seen for the following issues  Tenderness/pain left side of neck. No injury. Duration about one week. She has noticed some stiffness and soreness left side of neck. She woke up with this way one day. She has not had any sore throat. No fever. No adenopathy noted she has not tried any heat or other topical treatments.  Second issue is persistent right wrist pain. We suspected tendinitis and she was advised to use ice 2 times daily- without relief. Her pain is along the radial aspect of the wrist and is worse with wrist motion. No weakness. No numbness. No radiculopathy pain.  Past Medical History  Diagnosis Date  . Allergic rhinitis, cause unspecified   . Perforation of tympanic membrane, unspecified   . Unspecified essential hypertension   . Other and unspecified hyperlipidemia   . Irritable bowel syndrome   . Acquired absence of organ, genital organs   . Lumbago   . Disorder of bone and cartilage, unspecified   . Anxiety state, unspecified    Past Surgical History  Procedure Laterality Date  . Abdominal hysterectomy    . Cataract surgery  5/12 and 12/24/2012    both eyes    reports that she has never smoked. She has never used smokeless tobacco. She reports that she does not drink alcohol or use illicit drugs. family history includes COPD in her other; Lung cancer (age of onset: 46) in her mother; Lung disease (age of onset: 17) in her father. There is no history of Colon cancer. Allergies  Allergen Reactions  . Alendronate Sodium     REACTION: pt states "short of breath"  . Amoxicillin     REACTION: causes severe diarrhea  . Codeine     REACTION: pt states nausea  . Valsartan     REACTION: gums/throat swelling      Review of Systems  Constitutional: Negative for fever and chills.  HENT: Negative for sore throat and trouble swallowing.   Skin: Negative for  rash.  Neurological: Negative for weakness and numbness.  Hematological: Negative for adenopathy.       Objective:   Physical Exam  Constitutional: She appears well-developed and well-nourished.  HENT:  Right Ear: External ear normal.  Left Ear: External ear normal.  Mouth/Throat: Oropharynx is clear and moist.  Neck: Neck supple.  Cardiovascular: Normal rate and regular rhythm.   Pulmonary/Chest: Effort normal and breath sounds normal. No respiratory distress. She has no wheezes. She has no rales.  Musculoskeletal:  Neck reveals some tenderness over the left sternocleidomastoid muscle. She has some palpable increased muscle tension in her left trapezius and paracervical muscles compared to the right. Somewhat limited range of motion secondary to pain  Right wrist no visible swelling. No warmth. No erythema. She has tenderness involving the abductor and extensor tendons of the thumb. Good grip strength. Full range of motion wrist.  Lymphadenopathy:    She has no cervical adenopathy.          Assessment & Plan:  #1 left neck pain. Suspect musculoskeletal. No adenopathy. Try moist heat. Limited Robaxin 500 mg daily at bedtime. Consider physical therapy if not resolving with that #2 tendinitis right wrist. Discussed risk and benefits of steroid injection since she has not improved with icing. Patient consented. Prepped skin with Betadine. Using sterile technique injected 1/2 mL of Depo-Medrol parallel to involved  tendon (abductor pollicis longus) right wrist.  Continue icing

## 2015-02-06 NOTE — Patient Instructions (Signed)
De Quervain's Disease Harriet Pho disease is a condition often seen in racquet sports where there is a soreness (inflammation) in the cord like structures (tendons) which attach muscle to bone on the thumb side of the wrist. There may be a tightening of the tissuesaround the tendons. This condition is often helped by giving up or modifying the activity which caused it. When conservative treatment does not help, surgery may be required. Conservative treatment could include changes in the activity which brought about the problem or made it worse. Anti-inflammatory medications and injections may be used to help decrease the inflammation and help with pain control. Your caregiver will help you determine which is best for you. DIAGNOSIS  Often the diagnosis (learning what is wrong) can be made by examination. Sometimes x-rays are required. HOME CARE INSTRUCTIONS   Apply ice to the sore area for 15-20 minutes, 03-04 times per day while awake. Put the ice in a plastic bag and place a towel between the bag of ice and your skin. This is especially helpful if it can be done after all activities involving the sore wrist.  Temporary splinting may help.  Only take over-the-counter or prescription medicines for pain, discomfort or fever as directed by your caregiver. SEEK MEDICAL CARE IF:   Pain relief is not obtained with medications, or if you have increasing pain and seem to be getting worse rather than better. MAKE SURE YOU:   Understand these instructions.  Will watch your condition.  Will get help right away if you are not doing well or get worse. Document Released: 04/09/2001 Document Revised: 10/07/2011 Document Reviewed: 11/17/2013 [facility] Patient Information 2015 [place], [place]. This information is not intended to replace advice given to you by your health care provider. Make sure you discuss any questions you have with your health care provider.

## 2015-02-15 ENCOUNTER — Other Ambulatory Visit: Payer: Self-pay | Admitting: Family Medicine

## 2015-02-21 ENCOUNTER — Other Ambulatory Visit: Payer: Self-pay | Admitting: Family Medicine

## 2015-05-18 ENCOUNTER — Encounter: Payer: Self-pay | Admitting: Gastroenterology

## 2015-05-31 ENCOUNTER — Other Ambulatory Visit: Payer: Self-pay | Admitting: Family Medicine

## 2015-06-08 ENCOUNTER — Encounter: Payer: Self-pay | Admitting: Family Medicine

## 2015-06-08 ENCOUNTER — Ambulatory Visit (INDEPENDENT_AMBULATORY_CARE_PROVIDER_SITE_OTHER): Payer: Medicare Other | Admitting: Family Medicine

## 2015-06-08 VITALS — BP 130/78 | HR 60 | Temp 98.4°F | Ht 60.5 in | Wt 132.0 lb

## 2015-06-08 DIAGNOSIS — E785 Hyperlipidemia, unspecified: Secondary | ICD-10-CM

## 2015-06-08 DIAGNOSIS — Z0001 Encounter for general adult medical examination with abnormal findings: Secondary | ICD-10-CM | POA: Diagnosis not present

## 2015-06-08 DIAGNOSIS — M546 Pain in thoracic spine: Secondary | ICD-10-CM

## 2015-06-08 DIAGNOSIS — I10 Essential (primary) hypertension: Secondary | ICD-10-CM

## 2015-06-08 DIAGNOSIS — Z23 Encounter for immunization: Secondary | ICD-10-CM | POA: Diagnosis not present

## 2015-06-08 DIAGNOSIS — M549 Dorsalgia, unspecified: Secondary | ICD-10-CM

## 2015-06-08 DIAGNOSIS — Z0000 Encounter for general adult medical examination without abnormal findings: Secondary | ICD-10-CM

## 2015-06-08 LAB — CBC WITH DIFFERENTIAL/PLATELET
Basophils Absolute: 0 10*3/uL (ref 0.0–0.1)
Basophils Relative: 0.6 % (ref 0.0–3.0)
Eosinophils Absolute: 0.1 10*3/uL (ref 0.0–0.7)
Eosinophils Relative: 1.5 % (ref 0.0–5.0)
HCT: 43.1 % (ref 36.0–46.0)
HEMOGLOBIN: 14.2 g/dL (ref 12.0–15.0)
Lymphocytes Relative: 26.4 % (ref 12.0–46.0)
Lymphs Abs: 1.8 10*3/uL (ref 0.7–4.0)
MCHC: 33 g/dL (ref 30.0–36.0)
MCV: 91.7 fl (ref 78.0–100.0)
MONOS PCT: 9.2 % (ref 3.0–12.0)
Monocytes Absolute: 0.6 10*3/uL (ref 0.1–1.0)
Neutro Abs: 4.4 10*3/uL (ref 1.4–7.7)
Neutrophils Relative %: 62.3 % (ref 43.0–77.0)
Platelets: 265 10*3/uL (ref 150.0–400.0)
RBC: 4.7 Mil/uL (ref 3.87–5.11)
RDW: 13.5 % (ref 11.5–15.5)
WBC: 7 10*3/uL (ref 4.0–10.5)

## 2015-06-08 LAB — LIPID PANEL
Cholesterol: 175 mg/dL (ref 0–200)
HDL: 49.7 mg/dL (ref >39.00)
LDL Cholesterol: 91 mg/dL (ref 0–99)
NONHDL: 125.6
Total CHOL/HDL Ratio: 4
Triglycerides: 172 mg/dL — ABNORMAL HIGH (ref 0.0–149.0)
VLDL: 34.4 mg/dL (ref 0.0–40.0)

## 2015-06-08 LAB — HEPATIC FUNCTION PANEL
ALK PHOS: 61 U/L (ref 39–117)
ALT: 16 U/L (ref 0–35)
AST: 18 U/L (ref 0–37)
Albumin: 4.8 g/dL (ref 3.5–5.2)
Bilirubin, Direct: 0.1 mg/dL (ref 0.0–0.3)
TOTAL PROTEIN: 7.4 g/dL (ref 6.0–8.3)
Total Bilirubin: 0.5 mg/dL (ref 0.2–1.2)

## 2015-06-08 LAB — BASIC METABOLIC PANEL
BUN: 15 mg/dL (ref 6–23)
CALCIUM: 10.3 mg/dL (ref 8.4–10.5)
CO2: 29 mEq/L (ref 19–32)
CREATININE: 0.89 mg/dL (ref 0.40–1.20)
Chloride: 101 mEq/L (ref 96–112)
GFR: 64.86 mL/min (ref >60.00)
GLUCOSE: 108 mg/dL — AB (ref 70–99)
Potassium: 4.6 mEq/L (ref 3.5–5.1)
SODIUM: 139 meq/L (ref 135–145)

## 2015-06-08 LAB — TSH: TSH: 2.11 u[IU]/mL (ref 0.35–4.50)

## 2015-06-08 NOTE — Patient Instructions (Signed)
Monitor blood pressure and be in touch if consistently > 150/90 Continue with regular walking and daily calcium and Vit D

## 2015-06-08 NOTE — Progress Notes (Signed)
Pre visit review using our clinic review tool, if applicable. No additional management support is needed unless otherwise documented below in the visit note. 

## 2015-06-08 NOTE — Progress Notes (Signed)
Subjective:    Patient ID: Emily Cannon, female    DOB: 1933-07-29, 81 y.o.   MRN: [id number]  HPI Patient seen for complete physical. Her chronic problems include history of chronic anxiety, hypertension, irritable bowel syndrome, hyperlipidemia. Medications reviewed. Compliant with all. She needs flu vaccine. She had Prevnar last year. She's had prior shingles vaccine. Colonoscopy up-to-date. She gets yearly mammograms. Walks several times per week for exercise. She takes regular calcium and vitamin D. Nonsmoker  She is also here for separate problem of persistent left upper back and neck pain. No radiculopathy symptoms. Pain is mostly left paracervical and trapezius area. She has sensation of tightness. She took muscle relaxer briefly which did not help. She's tried heat and topical rubs without much relief. Pain worse with turning her neck to either side. He is right-hand dominant. Denies upper extremity numbness or weakness.  Past Medical History  Diagnosis Date  . Allergic rhinitis, cause unspecified   . Perforation of tympanic membrane, unspecified   . Unspecified essential hypertension   . Other and unspecified hyperlipidemia   . Irritable bowel syndrome   . Acquired absence of organ, genital organs   . Lumbago   . Disorder of bone and cartilage, unspecified   . Anxiety state, unspecified    Past Surgical History  Procedure Laterality Date  . Abdominal hysterectomy    . Cataract surgery  5/12 and 12/24/2012    both eyes    reports that she has never smoked. She has never used smokeless tobacco. She reports that she does not drink alcohol or use illicit drugs. family history includes COPD in her other; Lung cancer (age of onset: 11) in her mother; Lung disease (age of onset: 51) in her father. There is no history of Colon cancer. Allergies  Allergen Reactions  . Alendronate Sodium     REACTION: pt states "short of breath"  . Amoxicillin     REACTION: causes severe  diarrhea  . Codeine     REACTION: pt states nausea  . Valsartan     REACTION: gums/throat swelling      Review of Systems  Constitutional: Negative for fever, activity change, appetite change, fatigue and unexpected weight change.  HENT: Negative for ear pain, hearing loss, sore throat and trouble swallowing.   Eyes: Negative for visual disturbance.  Respiratory: Negative for cough and shortness of breath.   Cardiovascular: Negative for chest pain and palpitations.  Gastrointestinal: Negative for abdominal pain, diarrhea, constipation and blood in stool.  Endocrine: Negative for polydipsia and polyuria.  Genitourinary: Negative for dysuria and hematuria.  Musculoskeletal: Positive for back pain. Negative for myalgias.  Skin: Negative for rash.  Neurological: Negative for dizziness, syncope and headaches.  Hematological: Negative for adenopathy.  Psychiatric/Behavioral: Negative for confusion and dysphoric mood.       Objective:   Physical Exam  Constitutional: She is oriented to person, place, and time. She appears well-developed and well-nourished.  HENT:  Head: Normocephalic and atraumatic.  Eyes: EOM are normal. Pupils are equal, round, and reactive to light.  Neck: Normal range of motion. Neck supple. No thyromegaly present.  Cardiovascular: Normal rate, regular rhythm and normal heart sounds.   No murmur heard. Pulmonary/Chest: Breath sounds normal. No respiratory distress. She has no wheezes. She has no rales.  Abdominal: Soft. Bowel sounds are normal. She exhibits no distension and no mass. There is no tenderness. There is no rebound and no guarding.  Musculoskeletal: Normal range of motion. She exhibits no  edema.  She has some mild tenderness in palpating the left trapezius muscles. Full range of motion cervical spine. Full range of motion left shoulder. No impingement findings.  Lymphadenopathy:    She has no cervical adenopathy.  Neurological: She is alert and  oriented to person, place, and time. She displays normal reflexes. No cranial nerve deficit.  Skin: No rash noted.  Psychiatric: She has a normal mood and affect. Her behavior is normal. Judgment and thought content normal.          Assessment & Plan:  #1 complete physical. Flu vaccine given. Other immunizations up-to-date. Obtain screening lab work. Continue regular weightbearing exercise. Continue daily calcium and vitamin D #2 persistent left upper back pain. Suspect muscular. This has gone on for several months and not relieved with conservative therapies. Set up physical therapy

## 2015-06-21 ENCOUNTER — Encounter: Payer: Self-pay | Admitting: Physical Therapy

## 2015-06-21 ENCOUNTER — Other Ambulatory Visit: Payer: Self-pay | Admitting: Family Medicine

## 2015-06-21 ENCOUNTER — Ambulatory Visit: Payer: Medicare Other | Attending: Family Medicine | Admitting: Physical Therapy

## 2015-06-21 DIAGNOSIS — R29898 Other symptoms and signs involving the musculoskeletal system: Secondary | ICD-10-CM

## 2015-06-21 DIAGNOSIS — M539 Dorsopathy, unspecified: Secondary | ICD-10-CM | POA: Diagnosis not present

## 2015-06-21 DIAGNOSIS — M542 Cervicalgia: Secondary | ICD-10-CM | POA: Insufficient documentation

## 2015-06-21 NOTE — Therapy (Signed)
[facility] [address] [place], [place], [address] Phone: [phone]   Fax:  [phone]  Physical Therapy Evaluation  Patient Details  Name: Emily Cannon MRN: [id number] Date of Birth: February 18, 1933 Referring Provider: Dr. Carolann Littler  Encounter Date: 06/21/2015      PT End of Session - 06/21/15 0912    Visit Number 1   Number of Visits 10  Medicare   Date for PT Re-Evaluation 08/02/15   PT Start Time 0835   PT Stop Time 0913   PT Time Calculation (min) 38 min   Activity Tolerance Patient tolerated treatment well   Behavior During Therapy [facility] for tasks assessed/performed      Past Medical History  Diagnosis Date  . Allergic rhinitis, cause unspecified   . Perforation of tympanic membrane, unspecified   . Unspecified essential hypertension   . Other and unspecified hyperlipidemia   . Irritable bowel syndrome   . Acquired absence of organ, genital organs   . Lumbago   . Disorder of bone and cartilage, unspecified   . Anxiety state, unspecified     Past Surgical History  Procedure Laterality Date  . Abdominal hysterectomy    . Cataract surgery  5/12 and 12/24/2012    both eyes    There were no vitals filed for this visit.  Visit Diagnosis:  Neck pain - Plan: PT plan of care cert/re-cert  Tightness of neck - Plan: PT plan of care cert/re-cert      Subjective Assessment - 06/21/15 0841    Subjective Patient reports she has a knot in left shoulder making it difficulty to turn her head to the left.  Patient reports she started with it for 6 months with sudden onset.    Patient Stated Goals turn her neck to the left   Currently in Pain? Yes   Pain Score 8    Pain Location Neck   Pain Orientation Left   Pain Type Acute pain   Pain Onset More than a month ago   Pain Frequency Intermittent   Aggravating Factors  turn neck to left, sleeping,    Pain Relieving Factors keep still   Multiple  Pain Sites No            [facility] PT Assessment - 06/21/15 0001    Assessment   Medical Diagnosis M54.6 Upper back pain on left side   Referring Provider Dr. Carolann Littler   Onset Date/Surgical Date 08/29/14   Hand Dominance Right   Prior Therapy None   Precautions   Precautions None   Balance Screen   Has the patient fallen in the past 6 months No   Has the patient had a decrease in activity level because of a fear of falling?  No   Is the patient reluctant to leave their home because of a fear of falling?  No   Prior Function   Level of Independence Independent   Leisure walks 6-8 miles per week   Cognition   Overall Cognitive Status Within Functional Limits for tasks assessed   Observation/Other Assessments   Focus on Therapeutic Outcomes (FOTO)  49% limitation CK  Goal is 37% limitation CJ   Posture/Postural Control   Posture/Postural Control Postural limitations   Postural Limitations Forward head;Rounded Shoulders  increased cervical thoracic kyphosis   ROM / Strength   AROM / PROM / Strength AROM;Strength   AROM   AROM Assessment Site Cervical   Cervical Flexion decreased by 10%  with pain on left   Cervical Extension decreased by 75%   Cervical - Right Side Bend full   Cervical - Left Side Bend decreased by 75% with pain   Cervical - Right Rotation decreased by 50%   Cervical - Left Rotation decreased by 50%   Thoracic - Right Rotation decreased by 25%   Thoracic - Left Rotation decreased by 25%   Palpation   SI assessment  C3-4 rotated right, decreased mobility of C7-T4   Palpation comment trigger point in left upper trapezius, palpable tenderness located in left cervical paraspinals and thoracic paraspinals, left scalenes                           PT Education - 06/21/15 0911    Education provided Yes   Education Details using a ball for trigger point massage, cervical stretches   Person(s) Educated Patient   Methods  Explanation;Demonstration;Verbal cues;Handout   Comprehension Returned demonstration;Verbalized understanding          PT Short Term Goals - 06/21/15 0918    PT SHORT TERM GOAL #1   Title independent with cervical ROM exercises   Time 3   Period Weeks   Status New   PT SHORT TERM GOAL #2   Title turn her neck with pain decreased >/= 25%   Time 3   Period Weeks   Status New   PT SHORT TERM GOAL #3   Title sleep with pain decreased >/= 25%   Time 3   Period Weeks   Status New           PT Long Term Goals - 06/21/15 0919    PT LONG TERM GOAL #1   Title independent with HEP and understand how to progress herself   Time 6   Period Weeks   Status New   PT LONG TERM GOAL #2   Title cervical rotation is full and pain decreased >/= 75% so she can turn her head to drive   Time 6   Period Weeks   Status New   PT LONG TERM GOAL #3   Title sleep without dificulty due to full cervical motion   Time 6   Period Weeks   Status New   PT LONG TERM GOAL #4   Title FOTO score </= 37% limitation   Time 6   Period Weeks   Status New               Plan - 06/21/15 0913    Clinical Impression Statement Patient is a 81 year old female with diagnosis of upper back pain on left side for the past 6 months.  Patient reports intermittent pain in left upper trapezius 8/10 intermittently when she turns her head to the left.  FOTO score is 49% limitation.  Cervical ROM deficits: flexion decresaed by 105, extension and left sidebending decreased by 75%, and bilateral rotation decreased by 50%.  Bilateral thoracic  rotation decreased by 25%. C3-4 rotated right.  Decreased mobility of C7-T4.  Palpable tenderness located in left cervical paraspinals, scalenes and upper trapezius.  Patient would benefit from physical therapy to reduce pain and increased mobility.    Pt will benefit from skilled therapeutic intervention in order to improve on the following deficits Decreased activity  tolerance;Decreased mobility;Decreased strength;Impaired flexibility;Pain;Increased muscle spasms;Decreased endurance;Decreased range of motion   Rehab Potential Excellent   Clinical Impairments Affecting Rehab Potential None   PT Frequency 2x /  week   PT Duration 6 weeks   PT Treatment/Interventions Cryotherapy;Electrical Stimulation;Moist Heat;Therapeutic exercise;Therapeutic activities;Ultrasound;Neuromuscular re-education;Patient/family education;Manual techniques;Dry needling   PT Next Visit Plan cervcial ROM exercises, ultrasound to left cervical, joint mobilization to cervical and upper thoracic, soft tissue work   PT Home Exercise Plan cervcial ROM exercises   Recommended Other Services None   Consulted and Agree with Plan of Care Patient          G-Codes - 07-08-15 0836    Functional Assessment Tool Used FOTO score is 49% limitaiton   Goal is 37% limitation CJ   Functional Limitation Mobility: Walking and moving around   Mobility: Walking and Moving Around Current Status [phone]) At least 40 percent but less than 60 percent impaired, limited or restricted   Mobility: Walking and Moving Around Goal Status [phone]) At least 20 percent but less than 40 percent impaired, limited or restricted       Problem List Patient Active Problem List   Diagnosis Date Noted  . Unspecified venous (peripheral) insufficiency 11/23/2012  . Dermatitis 11/10/2012  . Hypothyroid 05/22/2011  . TYMPANIC MEMBRANE PERFORATION, LEFT EAR 08/08/2007  . OSTEOPENIA 07/27/2007  . Hyperlipidemia 05/11/2007  . Anxiety state 05/11/2007  . Essential hypertension 05/11/2007  . ALLERGIC RHINITIS 05/11/2007  . Irritable bowel syndrome 05/11/2007  . LOW BACK PAIN 05/11/2007  . HYSTERECTOMY, HX OF 05/11/2007    Jossie Smoot,PT July 08, 2015, 9:23 AM  [facility] [address], [place], [place], [address] Phone: [phone]   Fax:   [phone]  Name: Emily Cannon MRN: [id number] Date of Birth: Oct 04, 1932

## 2015-06-21 NOTE — Patient Instructions (Signed)
Levator Scapula Stretch, Sitting    Sit, one hand tucked under hip on side to be stretched, other hand over top of head. Turn head toward other side and look down. Use hand on head to gently stretch neck in that position. Hold _15__ seconds. Repeat _2__ times per session. Do _2__ sessions per day.  Copyright  VHI. All rights reserved.  Scalene Stretch, Sitting    Sit, one hand tucked under hip on side to be stretched, other hand over top of head. Gently pull head to side and backwards. Hold 15___ seconds. For more stretch, lean body in direction of head pull. Repeat _2__ times per session. Do _2__ sessions per day.  Copyright  VHI. All rights reserved.  Rotation With Wall Assist, Standing    Stand, feet 12-24 inches from wall and shoulder-width apart. Turn upper body and try to place hands on wall at shoulder height. Hold _15__ seconds. Repeat to other side. Repeat _2__ times per session. Do _2__ sessions per day.  Copyright  VHI. All rights reserved.  [facility] [address] [place], [place] [address] Phone # [phone] Fax [phone]

## 2015-06-26 ENCOUNTER — Other Ambulatory Visit: Payer: Self-pay | Admitting: Family Medicine

## 2015-06-26 MED ORDER — HYDRALAZINE HCL 50 MG PO TABS: 50.0000 mg | ORAL_TABLET | Freq: Two times a day (BID) | ORAL | Status: DC

## 2015-06-29 ENCOUNTER — Encounter: Payer: Self-pay | Admitting: Physical Therapy

## 2015-06-29 ENCOUNTER — Ambulatory Visit: Payer: Medicare Other | Attending: Family Medicine | Admitting: Physical Therapy

## 2015-06-29 DIAGNOSIS — M542 Cervicalgia: Secondary | ICD-10-CM

## 2015-06-29 DIAGNOSIS — R29898 Other symptoms and signs involving the musculoskeletal system: Secondary | ICD-10-CM

## 2015-06-29 DIAGNOSIS — M539 Dorsopathy, unspecified: Secondary | ICD-10-CM | POA: Diagnosis not present

## 2015-06-29 NOTE — Therapy (Signed)
[facility] [address] [place], [place], [address] Phone: [phone]   Fax:  [phone]  Physical Therapy Treatment  Patient Details  Name: Emily Cannon MRN: [id number] Date of Birth: Dec 13, 1932 Referring Provider: Dr. Carolann Littler  Encounter Date: 06/29/2015      PT End of Session - 06/29/15 0852    Visit Number 2   Number of Visits 10   Date for PT Re-Evaluation 08/02/15   PT Start Time 0759   PT Stop Time 0858   PT Time Calculation (min) 59 min   Activity Tolerance Patient tolerated treatment well   Behavior During Therapy [facility] for tasks assessed/performed      Past Medical History  Diagnosis Date  . Allergic rhinitis, cause unspecified   . Perforation of tympanic membrane, unspecified   . Unspecified essential hypertension   . Other and unspecified hyperlipidemia   . Irritable bowel syndrome   . Acquired absence of organ, genital organs   . Lumbago   . Disorder of bone and cartilage, unspecified   . Anxiety state, unspecified     Past Surgical History  Procedure Laterality Date  . Abdominal hysterectomy    . Cataract surgery  5/12 and 12/24/2012    both eyes    There were no vitals filed for this visit.  Visit Diagnosis:  Neck pain  Tightness of neck      Subjective Assessment - 06/29/15 0801    Subjective Pt had only evaluation, no goals achieved yet, but she reports compliance with initial HEP, what seems to make it sore.   Patient Stated Goals turn her neck to the left   Currently in Pain? Yes   Pain Score 6    Pain Location Neck   Pain Orientation Left   Pain Descriptors / Indicators Sore;Tightness;Tender   Pain Type Acute pain   Pain Onset More than a month ago   Pain Frequency Intermittent                         [facility] Adult PT Treatment/Exercise - 06/29/15 0001    Posture/Postural Control   Posture/Postural Control Postural limitations   Postural  Limitations Forward head;Rounded Shoulders  increased thoracic kyphosis   Exercises   Exercises Shoulder;Neck   Neck Exercises: Standing   Other Standing Exercises Thoracic/cervical/rotation with wall assist x 3 with 15 sec hold each side   Neck Exercises: Seated   Other Seated Exercise Scalene stretch and levator stretch x 3 with 15 sec hold   Other Seated Exercise Dynamic Selfmob in sitting with towelroll x 10 with 3 sec hold   Shoulder Exercises: Supine   Other Supine Exercises Selfmob over towelroll x 3 min, with overhead flexion x 10    Shoulder Exercises: Pulleys   Flexion 3 minutes  after pulley,s slighly incre of cervical ROM   Modalities   Modalities Ultrasound;Social worker IFC   Electrical Stimulation Parameters to patients tolerance   Electrical Stimulation Goals Tone;Pain   Ultrasound   Ultrasound Location bil UT, bil cervical paraspinals   Ultrasound Parameters 100%, 1Mhz, 0.8 W/cm   Ultrasound Goals Pain   Manual Therapy   Manual Therapy Soft tissue mobilization   Manual therapy comments to bil UT and bil cervical paraspinals                PT Education -  06/29/15 0817    Education provided Yes   Education Details selfmob over towelroll in supine and dynamic selfmob in sitting ,    Person(s) Educated Patient   Methods Explanation;Demonstration;Verbal cues;Handout   Comprehension Verbalized understanding;Returned demonstration          PT Short Term Goals - 06/29/15 0859    PT SHORT TERM GOAL #1   Title independent with cervical ROM exercises   Time 3   Period Weeks   Status On-going   PT SHORT TERM GOAL #2   Title turn her neck with pain decreased >/= 25%   Time 3   Period Weeks   Status On-going   PT SHORT TERM GOAL #3   Title sleep with pain decreased >/= 25%   Time 3   Period Weeks   Status On-going           PT  Long Term Goals - 06/21/15 0919    PT LONG TERM GOAL #1   Title independent with HEP and understand how to progress herself   Time 6   Period Weeks   Status New   PT LONG TERM GOAL #2   Title cervical rotation is full and pain decreased >/= 75% so she can turn her head to drive   Time 6   Period Weeks   Status New   PT LONG TERM GOAL #3   Title sleep without dificulty due to full cervical motion   Time 6   Period Weeks   Status New   PT LONG TERM GOAL #4   Title FOTO score </= 37% limitation   Time 6   Period Weeks   Status New               Plan - 06/29/15 0854    Clinical Impression Statement Patient is a 82y.o. female with diagnosis of thoracic pain and bil UT and cervical paraspinals. Patient with limited ROM cervical and decreased thoracic flexibility. Pt will continue to benefit from skilled PT.   Pt will benefit from skilled therapeutic intervention in order to improve on the following deficits Decreased activity tolerance;Decreased mobility;Decreased strength;Impaired flexibility;Pain;Increased muscle spasms;Decreased endurance;Decreased range of motion   Rehab Potential Excellent   PT Frequency 2x / week   PT Duration 6 weeks   PT Treatment/Interventions Cryotherapy;Electrical Stimulation;Moist Heat;Therapeutic exercise;Therapeutic activities;Ultrasound;Neuromuscular re-education;Patient/family education;Manual techniques;Dry needling   PT Next Visit Plan cervcial ROM exercises, ultrasound to left cervical, joint mobilization to cervical and upper thoracic, soft tissue work   PT Home Exercise Plan cervcial ROM exercises   Consulted and Agree with Plan of Care Patient        Problem List Patient Active Problem List   Diagnosis Date Noted  . Unspecified venous (peripheral) insufficiency 11/23/2012  . Dermatitis 11/10/2012  . Hypothyroid 05/22/2011  . TYMPANIC MEMBRANE PERFORATION, LEFT EAR 08/08/2007  . OSTEOPENIA 07/27/2007  . Hyperlipidemia 05/11/2007   . Anxiety state 05/11/2007  . Essential hypertension 05/11/2007  . ALLERGIC RHINITIS 05/11/2007  . Irritable bowel syndrome 05/11/2007  . LOW BACK PAIN 05/11/2007  . HYSTERECTOMY, HX OF 05/11/2007    NAUMANN-HOUEGNIFIO,Asti Mackley PTA 06/29/2015, 9:05 AM  [facility] [address] [place], [place], [address] Phone: [phone]   Fax:  [phone]  Name: Emily Cannon MRN: [id number] Date of Birth: 1932-09-27

## 2015-06-29 NOTE — Patient Instructions (Signed)
Thoracic Self-Mobilization (Sitting)   With small rolled towel at lower ribs level, gently lean back until stretch is felt. Hold _5 sec seconds. Relax. Repeat  10 times per set. Do 2 sets per session. Do  2-3  sessions per day.  http://orth.exer.us/998   Copyright  VHI. All rights reserved.  Thoracic Self-Mobilization Stretch (Supine)   With small rolled towel at lower ribs level, gently lie back until stretch is felt. Hold  3 minutes. Relax. Repeat _1___ times per set.  Do 1 -2  sessions per day.  http://orth.exer.us/994   Copyright  VHI. All rights reserved.

## 2015-07-05 ENCOUNTER — Encounter: Payer: Self-pay | Admitting: Physical Therapy

## 2015-07-05 ENCOUNTER — Ambulatory Visit: Payer: Medicare Other | Admitting: Physical Therapy

## 2015-07-05 DIAGNOSIS — M542 Cervicalgia: Secondary | ICD-10-CM

## 2015-07-05 DIAGNOSIS — R29898 Other symptoms and signs involving the musculoskeletal system: Secondary | ICD-10-CM

## 2015-07-05 DIAGNOSIS — M539 Dorsopathy, unspecified: Secondary | ICD-10-CM | POA: Diagnosis not present

## 2015-07-05 NOTE — Therapy (Signed)
[facility] [name], [place], [place], [address] Phone: [phone]   Fax:  [phone]  Physical Therapy Treatment  Patient Details  Name: Emily Cannon MRN: [id number] Date of Birth: [birth year] Referring Provider: Dr. Carolann Littler  Encounter Date: 07/05/2015      PT End of Session - 07/05/15 1205    Visit Number 3   Number of Visits 10   Date for PT Re-Evaluation 08/02/15   PT Start Time 1157   PT Stop Time 1247   PT Time Calculation (min) 58 min   Activity Tolerance Patient tolerated treatment well   Behavior During Therapy [facility] for tasks assessed/performed      Past Medical History  Diagnosis Date  . Allergic rhinitis, cause unspecified   . Perforation of tympanic membrane, unspecified   . Unspecified essential hypertension   . Other and unspecified hyperlipidemia   . Irritable bowel syndrome   . Acquired absence of organ, genital organs   . Lumbago   . Disorder of bone and cartilage, unspecified   . Anxiety state, unspecified     Past Surgical History  Procedure Laterality Date  . Abdominal hysterectomy    . Cataract surgery  5/12 and 12/24/2012    both eyes    There were no vitals filed for this visit.  Visit Diagnosis:  Neck pain  Tightness of neck      Subjective Assessment - 07/05/15 1159    Subjective Pt reports compliance with initial HEP, and she notices improvement in her cervical ROM    Pertinent History around 30years ago pt had left clavicle fracture   Currently in Pain? Yes   Pain Score 5    Pain Location Neck   Pain Orientation Left   Pain Descriptors / Indicators Tightness;Sore;Tender   Pain Type Acute pain   Pain Onset More than a month ago   Pain Frequency Intermittent   Multiple Pain Sites No                         [facility] Adult PT Treatment/Exercise - 07/05/15 0001    Posture/Postural Control   Posture/Postural Control Postural  limitations   Postural Limitations Forward head;Rounded Shoulders   Exercises   Exercises Shoulder;Neck   Neck Exercises: Standing   Other Standing Exercises Thoracic/cervical/rotation with wall assist x 3 with 15 sec hold each side   Neck Exercises: Seated   Other Seated Exercise Scalene stretch and levator stretch x 3 with 15 sec hold   Other Seated Exercise Dynamic Selfmob in sitting with towelroll x 10 with 3 sec hold   Shoulder Exercises: Supine   Other Supine Exercises Selfmob over towelroll x 3 min, with overhead flexion x 10    Shoulder Exercises: Pulleys   Flexion 3 minutes  added cervical movement    Modalities   Modalities Ultrasound;Electrical Stimulation;Moist Heat   Moist Heat Therapy   Number Minutes Moist Heat 15 Minutes   Moist Heat Location Cervical   Electrical Stimulation   Electrical Stimulation Location upper trapezius   Electrical Stimulation Action IFC   Electrical Stimulation Parameters to patients tolerance   Electrical Stimulation Goals Tone;Pain   Ultrasound   Ultrasound Location bil UT, bil cervical paraspals   Ultrasound Parameters 100%, 1 Mhz, 0.8W/cm    Manual Therapy   Manual Therapy Soft tissue mobilization   Manual therapy comments to bil UT and bil cervical paraspinals  PT Short Term Goals - 07/05/15 1212    PT SHORT TERM GOAL #1   Title independent with cervical ROM exercises   Time 3   Period Weeks   Status Achieved   PT SHORT TERM GOAL #2   Title turn her neck with pain decreased >/= 25%  20% as of 07/05/2015   Time 3   Period Weeks   Status Partially Met   PT SHORT TERM GOAL #3   Title sleep with pain decreased >/= 25%  50% improved   Time 3   Period Weeks   Status Achieved           PT Long Term Goals - 06/21/15 0919    PT LONG TERM GOAL #1   Title independent with HEP and understand how to progress herself   Time 6   Period Weeks   Status New   PT LONG TERM GOAL #2   Title cervical  rotation is full and pain decreased >/= 75% so she can turn her head to drive   Time 6   Period Weeks   Status New   PT LONG TERM GOAL #3   Title sleep without dificulty due to full cervical motion   Time 6   Period Weeks   Status New   PT LONG TERM GOAL #4   Title FOTO score </= 37% limitation   Time 6   Period Weeks   Status New               Plan - 07/05/15 1207    Clinical Impression Statement Patient is a 81 y.o. female with diagnosis of thoracic, bil upper trapezius and cervical paraspinal pain. Pt with limited ROM cervical and decreased thoracic flkexibility. Pt will continue to benefit from skilled PT.     Pt will benefit from skilled therapeutic intervention in order to improve on the following deficits Decreased activity tolerance;Decreased mobility;Decreased strength;Impaired flexibility;Pain;Increased muscle spasms;Decreased endurance;Decreased range of motion   Rehab Potential Excellent   PT Frequency 2x / week   PT Duration 6 weeks   PT Treatment/Interventions Cryotherapy;Electrical Stimulation;Moist Heat;Therapeutic exercise;Therapeutic activities;Ultrasound;Neuromuscular re-education;Patient/family education;Manual techniques;Dry needling   PT Next Visit Plan cervcial ROM exercises, ultrasound to left cervical, joint mobilization to cervical and upper thoracic, soft tissue work   PT Home Exercise Plan current HEP   Consulted and Agree with Plan of Care Patient        Problem List Patient Active Problem List   Diagnosis Date Noted  . Unspecified venous (peripheral) insufficiency 11/23/2012  . Dermatitis 11/10/2012  . Hypothyroid 05/22/2011  . TYMPANIC MEMBRANE PERFORATION, LEFT EAR 08/08/2007  . OSTEOPENIA 07/27/2007  . Hyperlipidemia 05/11/2007  . Anxiety state 05/11/2007  . Essential hypertension 05/11/2007  . ALLERGIC RHINITIS 05/11/2007  . Irritable bowel syndrome 05/11/2007  . LOW BACK PAIN 05/11/2007  . HYSTERECTOMY, HX OF 05/11/2007     NAUMANN-HOUEGNIFIO,Joni Norrod PTA 07/05/2015, 12:46 PM  [facility] [address], [place], [place], [address] Phone: [phone]   Fax:  [phone]  Name: Emily Cannon MRN: [id number] Date of Birth: 1933/06/18

## 2015-07-07 ENCOUNTER — Other Ambulatory Visit: Payer: Self-pay | Admitting: Family Medicine

## 2015-07-10 ENCOUNTER — Other Ambulatory Visit: Payer: Self-pay | Admitting: Family Medicine

## 2015-07-11 ENCOUNTER — Ambulatory Visit: Payer: Medicare Other | Admitting: Physical Therapy

## 2015-07-11 ENCOUNTER — Encounter: Payer: Self-pay | Admitting: Physical Therapy

## 2015-07-11 DIAGNOSIS — R29898 Other symptoms and signs involving the musculoskeletal system: Secondary | ICD-10-CM

## 2015-07-11 DIAGNOSIS — M539 Dorsopathy, unspecified: Secondary | ICD-10-CM | POA: Diagnosis not present

## 2015-07-11 DIAGNOSIS — M542 Cervicalgia: Secondary | ICD-10-CM | POA: Diagnosis not present

## 2015-07-11 NOTE — Therapy (Signed)
[facility] [address], [address] [place], [place], [address] Phone: [phone]   Fax:  [phone]  Physical Therapy Treatment  Patient Details  Name: Emily Cannon MRN: [id number] Date of Birth: 03/13/1933 Referring Provider: Dr. Bruce Burchette  Encounter Date: 07/11/2015      PT End of Session - 07/11/15 1023    Visit Number 4   Number of Visits 10   Date for PT Re-Evaluation 08/02/15   PT Start Time 1014   PT Stop Time 1115   PT Time Calculation (min) 61 min   Activity Tolerance Patient tolerated treatment well   Behavior During Therapy [facility] for tasks assessed/performed      Past Medical History  Diagnosis Date  . Allergic rhinitis, cause unspecified   . Perforation of tympanic membrane, unspecified   . Unspecified essential hypertension   . Other and unspecified hyperlipidemia   . Irritable bowel syndrome   . Acquired absence of organ, genital organs   . Lumbago   . Disorder of bone and cartilage, unspecified   . Anxiety state, unspecified     Past Surgical History  Procedure Laterality Date  . Abdominal hysterectomy    . Cataract surgery  5/12 and 12/24/2012    both eyes    There were no vitals filed for this visit.  Visit Diagnosis:  Neck pain  Tightness of neck      Subjective Assessment - 07/11/15 1019    Subjective Pt reports her improvement in her neck as 50%.    Currently in Pain? Yes   Pain Score 4    Pain Location Neck   Pain Descriptors / Indicators Sore;Tender;Tightness   Pain Type Acute pain   Pain Onset More than a month ago   Pain Frequency Intermittent   Multiple Pain Sites No                         [facility] Adult PT Treatment/Exercise - 07/11/15 0001    Posture/Postural Control   Posture/Postural Control Postural limitations   Postural Limitations Forward head;Rounded Shoulders   Exercises   Exercises Shoulder;Neck   Neck Exercises: Standing   Other  Standing Exercises Thoracic/cervical/rotation with wall assist x 3 with 15 sec hold each side   Neck Exercises: Seated   Other Seated Exercise Scalene stretch and levator stretch x 3 with 15 sec hold   Other Seated Exercise Dynamic Selfmob in sitting with towelroll x 10 with 3 sec hold   Shoulder Exercises: Supine   Other Supine Exercises Selfmob over towelroll x 3 min, with overhead flexion x 10    Shoulder Exercises: Pulleys   Flexion 3 minutes   Other Pulley Exercises IR with Lt UE with rotation of head to Rt x 3 min in standing   Modalities   Modalities Ultrasound;Electrical Stimulation;Moist Heat   Moist Heat Therapy   Number Minutes Moist Heat 15 Minutes   Moist Heat Location Cervical   Electrical Stimulation   Electrical Stimulation Location upper trapezius   Electrical Stimulation Action IFC   Electrical Stimulation Parameters to patients tolerance   Electrical Stimulation Goals Tone;Pain   Ultrasound   Ultrasound Location bil UT, bil cervical paraspinals    Ultrasound Parameters 100%, 1 MHz, 1W/cm   Ultrasound Goals Pain   Manual Therapy   Manual Therapy Soft tissue mobilization   Manual therapy comments to bil UT and bil cervical paraspinals                    PT Short Term Goals - 07/11/15 1029    PT SHORT TERM GOAL #1   Title independent with cervical ROM exercises   Time 3   Period Weeks   Status Achieved   PT SHORT TERM GOAL #2   Title turn her neck with pain decreased >/= 25%   Time 3   Period Weeks   Status Partially Met   PT SHORT TERM GOAL #3   Title sleep with pain decreased >/= 25%   Time 3   Period Weeks   Status Achieved           PT Long Term Goals - 07/11/15 1030    PT LONG TERM GOAL #1   Title independent with HEP and understand how to progress herself   Time 6   Period Weeks   Status On-going   PT LONG TERM GOAL #2   Title cervical rotation is full and pain decreased >/= 75% so she can turn her head to drive   Time 6    Period Weeks   Status On-going   PT LONG TERM GOAL #3   Title sleep without dificulty due to full cervical motion   Time 6   Period Weeks   Status On-going   PT LONG TERM GOAL #4   Title FOTO score </= 37% limitation   Time 6   Period Weeks   Status On-going               Plan - 07/11/15 1024    Clinical Impression Statement Patient is a 81 year old female with diagnosis of thoracic, bil upper trapezius and cervial paraspinal pain. Pt  with improved cervical ROM, she needs correction with stretching for proper technique   Pt will benefit from skilled therapeutic intervention in order to improve on the following deficits Decreased activity tolerance;Decreased mobility;Decreased strength;Impaired flexibility;Pain;Increased muscle spasms;Decreased endurance;Decreased range of motion   Rehab Potential Excellent   PT Frequency 2x / week   PT Duration 6 weeks   PT Treatment/Interventions Cryotherapy;Electrical Stimulation;Moist Heat;Therapeutic exercise;Therapeutic activities;Ultrasound;Neuromuscular re-education;Patient/family education;Manual techniques;Dry needling   PT Next Visit Plan cervcial ROM exercises, ultrasound to left cervical, joint mobilization to cervical and upper thoracic, soft tissue work   PT Home Exercise Plan current HEP   Consulted and Agree with Plan of Care Patient        Problem List Patient Active Problem List   Diagnosis Date Noted  . Unspecified venous (peripheral) insufficiency 11/23/2012  . Dermatitis 11/10/2012  . Hypothyroid 05/22/2011  . TYMPANIC MEMBRANE PERFORATION, LEFT EAR 08/08/2007  . OSTEOPENIA 07/27/2007  . Hyperlipidemia 05/11/2007  . Anxiety state 05/11/2007  . Essential hypertension 05/11/2007  . ALLERGIC RHINITIS 05/11/2007  . Irritable bowel syndrome 05/11/2007  . LOW BACK PAIN 05/11/2007  . HYSTERECTOMY, HX OF 05/11/2007    Cannon,Emily Bias PTA 07/11/2015, 11:04 AM  [facility] [address] [place], [place], [address] Phone: [phone]   Fax:  [phone]  Name: Emily Cannon MRN: [id number] Date of Birth: 29-Nov-1932

## 2015-07-13 ENCOUNTER — Encounter: Payer: Self-pay | Admitting: Physical Therapy

## 2015-07-13 ENCOUNTER — Ambulatory Visit: Payer: Medicare Other | Admitting: Physical Therapy

## 2015-07-13 DIAGNOSIS — R29898 Other symptoms and signs involving the musculoskeletal system: Secondary | ICD-10-CM

## 2015-07-13 DIAGNOSIS — M539 Dorsopathy, unspecified: Secondary | ICD-10-CM | POA: Diagnosis not present

## 2015-07-13 DIAGNOSIS — M542 Cervicalgia: Secondary | ICD-10-CM

## 2015-07-13 NOTE — Therapy (Signed)
[facility] [address], [place], [place], [address] Phone: [phone]   Fax:  [phone]  Physical Therapy Treatment  Patient Details  Name: Emily Cannon MRN: [id number] Date of Birth: 1932-10-30 Referring Provider: Dr. Carolann Littler  Encounter Date: 07/13/2015      PT End of Session - 07/13/15 0940    Visit Number 5   Number of Visits 10  medicare   Date for PT Re-Evaluation 08/02/15   PT Start Time 0935   PT Stop Time 1013   PT Time Calculation (min) 38 min   Activity Tolerance Patient tolerated treatment well   Behavior During Therapy [facility] for tasks assessed/performed      Past Medical History  Diagnosis Date  . Allergic rhinitis, cause unspecified   . Perforation of tympanic membrane, unspecified   . Unspecified essential hypertension   . Other and unspecified hyperlipidemia   . Irritable bowel syndrome   . Acquired absence of organ, genital organs   . Lumbago   . Disorder of bone and cartilage, unspecified   . Anxiety state, unspecified     Past Surgical History  Procedure Laterality Date  . Abdominal hysterectomy    . Cataract surgery  5/12 and 12/24/2012    both eyes    There were no vitals filed for this visit.  Visit Diagnosis:  Neck pain  Tightness of neck      Subjective Assessment - 07/13/15 0943    Subjective I feel 70% better.  I have trouble driving with turning her head.   Pertinent History around 30years ago pt had left clavicle fracture   Patient Stated Goals turn her neck to the left   Currently in Pain? Yes   Pain Score 4    Pain Location Neck   Pain Orientation Left   Pain Descriptors / Indicators Tightness   Pain Type Acute pain   Pain Onset More than a month ago   Pain Frequency Intermittent   Aggravating Factors  turn neck to left, sleeping    Pain Relieving Factors keeping still   Multiple Pain Sites No            [facility] PT Assessment - 07/13/15  0001    AROM   AROM Assessment Site Cervical   Cervical Flexion full   Cervical Extension decreased by 25%   Cervical - Left Side Bend decreased by 50%   Cervical - Right Rotation decreased by 25%   Cervical - Left Rotation decreased by 25%   Thoracic - Right Rotation full   Thoracic - Left Rotation decreased by 25%                     [facility] Adult PT Treatment/Exercise - 07/13/15 0001    Neck Exercises: Seated   Cervical Rotation 5 reps;Right  with guidance to increase thoracic right rotation   Shoulder Exercises: Seated   Other Seated Exercises PNF bil. chop/lift with assistance and overpressure 10x   Shoulder Exercises: Pulleys   Flexion 3 minutes   Other Pulley Exercises IR with Lt UE with rotation of head to Rt x 3 min in standing   Manual Therapy   Manual Therapy Soft tissue mobilization;Joint mobilization;Muscle Energy Technique   Joint Mobilization P-A and rotational glide to C4-T4 in sitting   Soft tissue mobilization cervical and bil. upper trap, and suboccpital   Muscle Energy Technique to rotate C4 to the right, and to increase subocciptial flexion  PT Education - 07/13/15 1011    Education provided No          PT Short Term Goals - 07/11/15 1029    PT SHORT TERM GOAL #1   Title independent with cervical ROM exercises   Time 3   Period Weeks   Status Achieved   PT SHORT TERM GOAL #2   Title turn her neck with pain decreased >/= 25%   Time 3   Period Weeks   Status Partially Met   PT SHORT TERM GOAL #3   Title sleep with pain decreased >/= 25%   Time 3   Period Weeks   Status Achieved           PT Long Term Goals - 07/11/15 1030    PT LONG TERM GOAL #1   Title independent with HEP and understand how to progress herself   Time 6   Period Weeks   Status On-going   PT LONG TERM GOAL #2   Title cervical rotation is full and pain decreased >/= 75% so she can turn her head to drive   Time 6   Period Weeks    Status On-going   PT LONG TERM GOAL #3   Title sleep without dificulty due to full cervical motion   Time 6   Period Weeks   Status On-going   PT LONG TERM GOAL #4   Title FOTO score </= 37% limitation   Time 6   Period Weeks   Status On-going               Plan - 07/13/15 1011    Clinical Impression Statement Patient is an 81 year old female with diagnosis of thoracic, bil. upper trapezius, and cervical paraspinal pain.  Patient has decreased mobility of C1 and C4-T4 mobility.  Cervical ROM has improved by 25% in all directions.  Patient will benefit formp hysical therapy to improve mobility and reduce pain.    Pt will benefit from skilled therapeutic intervention in order to improve on the following deficits Decreased activity tolerance;Decreased mobility;Decreased strength;Impaired flexibility;Pain;Increased muscle spasms;Decreased endurance;Decreased range of motion   Rehab Potential Excellent   Clinical Impairments Affecting Rehab Potential None   PT Frequency 2x / week   PT Duration 6 weeks   PT Treatment/Interventions Cryotherapy;Electrical Stimulation;Moist Heat;Therapeutic exercise;Therapeutic activities;Ultrasound;Neuromuscular re-education;Patient/family education;Manual techniques;Dry needling   PT Next Visit Plan cervcial ROM exercises, ultrasound to left cervical, joint mobilization to cervical and upper thoracic, soft tissue work   PT Home Exercise Plan current HEP   Consulted and Agree with Plan of Care Patient        Problem List Patient Active Problem List   Diagnosis Date Noted  . Unspecified venous (peripheral) insufficiency 11/23/2012  . Dermatitis 11/10/2012  . Hypothyroid 05/22/2011  . TYMPANIC MEMBRANE PERFORATION, LEFT EAR 08/08/2007  . OSTEOPENIA 07/27/2007  . Hyperlipidemia 05/11/2007  . Anxiety state 05/11/2007  . Essential hypertension 05/11/2007  . ALLERGIC RHINITIS 05/11/2007  . Irritable bowel syndrome 05/11/2007  . LOW BACK PAIN  05/11/2007  . HYSTERECTOMY, HX OF 05/11/2007    Earlie Counts, PT 07/13/2015 10:14 AM   [facility] [address], [place], [place], [address] Phone: [phone]   Fax:  [phone]  Name: DAVIN MURAMOTO MRN: [id number] Date of Birth: [birth year]

## 2015-07-18 ENCOUNTER — Encounter: Payer: Self-pay | Admitting: Physical Therapy

## 2015-07-18 ENCOUNTER — Ambulatory Visit: Payer: Medicare Other | Admitting: Physical Therapy

## 2015-07-18 DIAGNOSIS — R29898 Other symptoms and signs involving the musculoskeletal system: Secondary | ICD-10-CM

## 2015-07-18 DIAGNOSIS — M542 Cervicalgia: Secondary | ICD-10-CM | POA: Diagnosis not present

## 2015-07-18 DIAGNOSIS — M539 Dorsopathy, unspecified: Secondary | ICD-10-CM | POA: Diagnosis not present

## 2015-07-18 DIAGNOSIS — H6123 Impacted cerumen, bilateral: Secondary | ICD-10-CM | POA: Diagnosis not present

## 2015-07-18 DIAGNOSIS — H903 Sensorineural hearing loss, bilateral: Secondary | ICD-10-CM | POA: Diagnosis not present

## 2015-07-18 NOTE — Patient Instructions (Signed)
Cervico-Thoracic: Extension / Rotation (Sitting)    Reach across body with left arm and grasp back of chair. Gently look over right side shoulder. Hold ___20_ seconds.  Relax. Repeat to left side  Repeat 3____ times per set. Do 1____ sets per session. Do 2-3____ sessions per day.  [email or web address]

## 2015-07-18 NOTE — Therapy (Signed)
[facility] [address], [place], [place], [address] Phone: [phone]   Fax:  [phone]  Physical Therapy Treatment  Patient Details  Name: Emily Cannon MRN: [id number] Date of Birth: 08-13-1932 Referring Provider: Dr. Carolann Littler  Encounter Date: 07/18/2015      PT End of Session - 07/18/15 1053    Visit Number 6   Number of Visits 10   Date for PT Re-Evaluation 08/02/15   PT Start Time 6962   PT Stop Time 1100   PT Time Calculation (min) 46 min   Activity Tolerance Patient tolerated treatment well   Behavior During Therapy [facility] for tasks assessed/performed      Past Medical History  Diagnosis Date  . Allergic rhinitis, cause unspecified   . Perforation of tympanic membrane, unspecified   . Unspecified essential hypertension   . Other and unspecified hyperlipidemia   . Irritable bowel syndrome   . Acquired absence of organ, genital organs   . Lumbago   . Disorder of bone and cartilage, unspecified   . Anxiety state, unspecified     Past Surgical History  Procedure Laterality Date  . Abdominal hysterectomy    . Cataract surgery  5/12 and 12/24/2012    both eyes    There were no vitals filed for this visit.  Visit Diagnosis:  Neck pain  Tightness of neck      Subjective Assessment - 07/18/15 1043    Subjective I feel miuch better, the movement in my neck improved by 80%   Pertinent History around 30years ago pt had left clavicle fracture   Patient Stated Goals turn her neck to the left   Currently in Pain? Yes   Pain Score 4   but only with movement   Pain Location Neck   Pain Orientation Left   Pain Descriptors / Indicators Tightness   Pain Type Acute pain   Pain Onset More than a month ago   Pain Frequency Intermittent   Aggravating Factors  turning neck to left, sleeping   Pain Relieving Factors keeping still   Multiple Pain Sites No                         [facility] Adult PT Treatment/Exercise - 07/18/15 0001    Exercises   Exercises Shoulder;Neck   Neck Exercises: Standing   Other Standing Exercises Thoracic/cervical/rotation with wall assist x 3 with 15 sec hold each side   Neck Exercises: Seated   Cervical Rotation 5 reps;Right  with guidance for arm placement to incr elongation   Other Seated Exercise Cervico /thoracic selfmob in sitting x 3 with 20 sec hold   Shoulder Exercises: Supine   Other Supine Exercises Selfmob over towelroll x 3 min, with overhead flexion x 10    Shoulder Exercises: Seated   Other Seated Exercises PNF bil. chop/lift with assistance and overpressure 10x   Shoulder Exercises: Pulleys   Flexion 3 minutes   Other Pulley Exercises IR with Lt UE with rotation of head to Rt x 3 min in standing   Moist Heat Therapy   Number Minutes Moist Heat 15 Minutes   Moist Heat Location Cervical   Manual Therapy   Manual Therapy Soft tissue mobilization  to bil cervical paraspinals,and bil UT with PROM for elong                  PT Education - 07/18/15 1051    Education  provided Yes   Education Details cervico / thoracic selfmob in sitting   Person(s) Educated Patient   Methods Explanation;Demonstration;Verbal cues   Comprehension Verbalized understanding;Returned demonstration          PT Short Term Goals - 07/11/15 1029    PT SHORT TERM GOAL #1   Title independent with cervical ROM exercises   Time 3   Period Weeks   Status Achieved   PT SHORT TERM GOAL #2   Title turn her neck with pain decreased >/= 25%   Time 3   Period Weeks   Status Partially Met   PT SHORT TERM GOAL #3   Title sleep with pain decreased >/= 25%   Time 3   Period Weeks   Status Achieved           PT Long Term Goals - 07/11/15 1030    PT LONG TERM GOAL #1   Title independent with HEP and understand how to progress herself   Time 6   Period Weeks   Status On-going   PT LONG TERM GOAL #2   Title cervical rotation is  full and pain decreased >/= 75% so she can turn her head to drive   Time 6   Period Weeks   Status On-going   PT LONG TERM GOAL #3   Title sleep without dificulty due to full cervical motion   Time 6   Period Weeks   Status On-going   PT LONG TERM GOAL #4   Title FOTO score </= 37% limitation   Time 6   Period Weeks   Status On-going               Plan - 07/18/15 1053    Clinical Impression Statement Patient is a 81 year old female with diagnosis of thoracic, bil upper trapezius and cervical paraspinal pain, mainly on left side due to old clavicle fracture. Pt will continue to benefit from skilled Pt to improve mobility and reduce pain.     Pt will benefit from skilled therapeutic intervention in order to improve on the following deficits Decreased activity tolerance;Decreased mobility;Decreased strength;Impaired flexibility;Pain;Increased muscle spasms;Decreased endurance;Decreased range of motion   Rehab Potential Excellent   PT Frequency 2x / week   PT Duration 6 weeks   PT Treatment/Interventions Cryotherapy;Electrical Stimulation;Moist Heat;Therapeutic exercise;Therapeutic activities;Ultrasound;Neuromuscular re-education;Patient/family education;Manual techniques;Dry needling   PT Next Visit Plan cervcial ROM exercises, ultrasound to left cervical, joint mobilization to cervical and upper thoracic, soft tissue work   PT Home Exercise Plan current HEP   Consulted and Agree with Plan of Care Patient        Problem List Patient Active Problem List   Diagnosis Date Noted  . Unspecified venous (peripheral) insufficiency 11/23/2012  . Dermatitis 11/10/2012  . Hypothyroid 05/22/2011  . TYMPANIC MEMBRANE PERFORATION, LEFT EAR 08/08/2007  . OSTEOPENIA 07/27/2007  . Hyperlipidemia 05/11/2007  . Anxiety state 05/11/2007  . Essential hypertension 05/11/2007  . ALLERGIC RHINITIS 05/11/2007  . Irritable bowel syndrome 05/11/2007  . LOW BACK PAIN 05/11/2007  . HYSTERECTOMY,  HX OF 05/11/2007    NAUMANN-HOUEGNIFIO,Emily [place] 07/18/2015, 1:22 PM  [facility] [address], [place], [place], [address] Phone: [phone]   Fax:  [phone]  Name: [place] MRN: [id number] Date of Birth: 04-06-1933

## 2015-07-20 ENCOUNTER — Encounter: Payer: Self-pay | Admitting: Physical Therapy

## 2015-07-20 ENCOUNTER — Ambulatory Visit: Payer: Medicare Other | Admitting: Physical Therapy

## 2015-07-20 DIAGNOSIS — M542 Cervicalgia: Secondary | ICD-10-CM | POA: Diagnosis not present

## 2015-07-20 DIAGNOSIS — R29898 Other symptoms and signs involving the musculoskeletal system: Secondary | ICD-10-CM

## 2015-07-20 DIAGNOSIS — M539 Dorsopathy, unspecified: Secondary | ICD-10-CM | POA: Diagnosis not present

## 2015-07-20 NOTE — Therapy (Signed)
[facility] [address], [place], [place], [address] Phone: [phone]   Fax:  [phone]  Physical Therapy Treatment  Patient Details  Name: [place] MRN: [id number] Date of Birth: 28-Jan-1933 Referring Provider: Dr. Carolann Littler  Encounter Date: 07/20/2015    Past Medical History  Diagnosis Date  . Allergic rhinitis, cause unspecified   . Perforation of tympanic membrane, unspecified   . Unspecified essential hypertension   . Other and unspecified hyperlipidemia   . Irritable bowel syndrome   . Acquired absence of organ, genital organs   . Lumbago   . Disorder of bone and cartilage, unspecified   . Anxiety state, unspecified     Past Surgical History  Procedure Laterality Date  . Abdominal hysterectomy    . Cataract surgery  5/12 and 12/24/2012    both eyes    There were no vitals filed for this visit.  Visit Diagnosis:  Neck pain  Tightness of neck      Subjective Assessment - 07/20/15 1039    Subjective Pt feels much better, the movement in neck improved by 80%   Pertinent History around 30years ago pt had left clavicle fracture   Patient Stated Goals turn her neck to the left   Currently in Pain? No/denies  only pain with cervical end range of rotation rated as 3/10   Multiple Pain Sites No                         [facility] Adult PT Treatment/Exercise - 07/20/15 0001    Posture/Postural Control   Posture/Postural Control Postural limitations   Postural Limitations Forward head;Rounded Shoulders   Exercises   Exercises Shoulder;Neck   Neck Exercises: Standing   Other Standing Exercises Thoracic/cervical/rotation with wall assist x 3 with 15 sec hold each side   Neck Exercises: Seated   Cervical Rotation 5 reps;Right  with verbal cues for armplacement   Other Seated Exercise Cervico /thoracic selfmob in sitting x 3 with 20 sec hold   Shoulder Exercises: Supine   Other Supine Exercises Selfmob over towelroll x 3 min, with overhead flexion x 10    Shoulder Exercises: Seated   Other Seated Exercises PNF bil. chop/lift with assistance and overpressure 10x   Shoulder Exercises: Pulleys   Flexion 3 minutes   Other Pulley Exercises IR with Lt UE with rotation of head to Rt x 3 min in standing   Moist Heat Therapy   Number Minutes Moist Heat 15 Minutes   Moist Heat Location Cervical   Electrical Stimulation   Electrical Stimulation Location upper trapezius   Electrical Stimulation Action IFC   Electrical Stimulation Parameters to patients tolerance   Electrical Stimulation Goals Tone   Manual Therapy   Manual Therapy Soft tissue mobilization                  PT Short Term Goals - 07/11/15 1029    PT SHORT TERM GOAL #1   Title independent with cervical ROM exercises   Time 3   Period Weeks   Status Achieved   PT SHORT TERM GOAL #2   Title turn her neck with pain decreased >/= 25%   Time 3   Period Weeks   Status Partially Met   PT SHORT TERM GOAL #3   Title sleep with pain decreased >/= 25%   Time 3   Period Weeks   Status Achieved  PT Long Term Goals - 07/11/15 1030    PT LONG TERM GOAL #1   Title independent with HEP and understand how to progress herself   Time 6   Period Weeks   Status On-going   PT LONG TERM GOAL #2   Title cervical rotation is full and pain decreased >/= 75% so she can turn her head to drive   Time 6   Period Weeks   Status On-going   PT LONG TERM GOAL #3   Title sleep without dificulty due to full cervical motion   Time 6   Period Weeks   Status On-going   PT LONG TERM GOAL #4   Title FOTO score </= 37% limitation   Time 6   Period Weeks   Status On-going               Problem List Patient Active Problem List   Diagnosis Date Noted  . Unspecified venous (peripheral) insufficiency 11/23/2012  . Dermatitis 11/10/2012  . Hypothyroid 05/22/2011  . TYMPANIC MEMBRANE  PERFORATION, LEFT EAR 08/08/2007  . OSTEOPENIA 07/27/2007  . Hyperlipidemia 05/11/2007  . Anxiety state 05/11/2007  . Essential hypertension 05/11/2007  . ALLERGIC RHINITIS 05/11/2007  . Irritable bowel syndrome 05/11/2007  . LOW BACK PAIN 05/11/2007  . HYSTERECTOMY, HX OF 05/11/2007    NAUMANN-HOUEGNIFIO,Inigo Lantigua PTA 07/20/2015, 11:16 AM  [facility] [address] [place], [place], [address] Phone: [phone]   Fax:  [phone]  Name: DORINNE GRAEFF MRN: [id number] Date of Birth: 04-29-1933

## 2015-07-25 ENCOUNTER — Encounter: Payer: Self-pay | Admitting: Physical Therapy

## 2015-07-25 ENCOUNTER — Ambulatory Visit: Payer: Medicare Other | Admitting: Physical Therapy

## 2015-07-25 DIAGNOSIS — M542 Cervicalgia: Secondary | ICD-10-CM

## 2015-07-25 DIAGNOSIS — R29898 Other symptoms and signs involving the musculoskeletal system: Secondary | ICD-10-CM

## 2015-07-25 DIAGNOSIS — M539 Dorsopathy, unspecified: Secondary | ICD-10-CM | POA: Diagnosis not present

## 2015-07-25 NOTE — Therapy (Signed)
[facility] [address], [place], [place], [address] Phone: [phone]   Fax:  [phone]  Physical Therapy Treatment  Patient Details  Name: [place] MRN: [id number] Date of Birth: [birth year] Referring Provider: Dr. Carolann Littler  Encounter Date: 07/25/2015      PT End of Session - 07/25/15 1101    Visit Number 7   Number of Visits 10   Date for PT Re-Evaluation 08/02/15   PT Start Time 1056   PT Stop Time 1159   PT Time Calculation (min) 63 min   Activity Tolerance Patient tolerated treatment well   Behavior During Therapy [facility] for tasks assessed/performed      Past Medical History  Diagnosis Date  . Allergic rhinitis, cause unspecified   . Perforation of tympanic membrane, unspecified   . Unspecified essential hypertension   . Other and unspecified hyperlipidemia   . Irritable bowel syndrome   . Acquired absence of organ, genital organs   . Lumbago   . Disorder of bone and cartilage, unspecified   . Anxiety state, unspecified     Past Surgical History  Procedure Laterality Date  . Abdominal hysterectomy    . Cataract surgery  5/12 and 12/24/2012    both eyes    There were no vitals filed for this visit.  Visit Diagnosis:  Neck pain  Tightness of neck                       [facility] Adult PT Treatment/Exercise - 07/25/15 0001    Posture/Postural Control   Posture/Postural Control Postural limitations   Postural Limitations Forward head;Rounded Shoulders   Exercises   Exercises Shoulder;Neck   Neck Exercises: Standing   Other Standing Exercises Thoracic/cervical/rotation with wall assist x 3 with 15 sec hold each side   Neck Exercises: Seated   Cervical Rotation 5 reps;Right  with reminders for arm placement   Other Seated Exercise Cervico /thoracic selfmob in sitting x 3 with 20 sec hold   Other Seated Exercise Dynamic Selfmob in sitting with towelroll x 10 with 3  sec hold   Shoulder Exercises: Supine   Other Supine Exercises Selfmob over towelroll x 3 min, with overhead flexion x 10    Shoulder Exercises: Seated   Other Seated Exercises PNF bil. chop/lift with assistance and overpressure 10x   Shoulder Exercises: Pulleys   Flexion 3 minutes   Other Pulley Exercises IR with Lt UE with rotation of head to Rt x 3 min in standing   Moist Heat Therapy   Number Minutes Moist Heat 15 Minutes   Moist Heat Location Cervical   Electrical Stimulation   Electrical Stimulation Location upper trapezius   Electrical Stimulation Action IFC   Electrical Stimulation Parameters to patients to   Electrical Stimulation Goals Tone   Manual Therapy   Manual Therapy Soft tissue mobilization  to bil cervical paraspinals and bil UT in supine                  PT Short Term Goals - 07/25/15 1106    PT SHORT TERM GOAL #1   Title independent with cervical ROM exercises   Time 3   Period Weeks   Status Achieved   PT SHORT TERM GOAL #2   Title turn her neck with pain decreased >/= 25%  90% improved   Time 3   Period Weeks   Status Achieved   PT SHORT TERM GOAL #3  Title sleep with pain decreased >/= 25%   Time 3   Period Weeks   Status Achieved           PT Long Term Goals - 07/25/15 1107    PT LONG TERM GOAL #1   Title independent with HEP and understand how to progress herself   Time 6   Period Weeks   Status On-going   PT LONG TERM GOAL #2   Title cervical rotation is full and pain decreased >/= 75% so she can turn her head to drive  [id number] improved   Time 6   Period Weeks   Status Achieved   PT LONG TERM GOAL #3   Title sleep without dificulty due to full cervical motion   Time 6   Period Weeks   Status Achieved   PT LONG TERM GOAL #4   Title FOTO score </= 37% limitation   Time 6   Period Weeks   Status On-going               Plan - 07/25/15 1102    Clinical Impression Statement Patient is a 81 year old female with  diagnosis of thoracic, bil upper trapezius and fcervical paraspinal pain, mainly due to old clavicle fracture. Pt with improved cervical ROM. Pt wilth one more visit and then D/C to HEP    Pt will benefit from skilled therapeutic intervention in order to improve on the following deficits Decreased activity tolerance;Decreased mobility;Decreased strength;Impaired flexibility;Pain;Increased muscle spasms;Decreased endurance;Decreased range of motion   Rehab Potential Excellent   PT Frequency 2x / week   PT Duration 6 weeks   PT Treatment/Interventions Cryotherapy;Electrical Stimulation;Moist Heat;Therapeutic exercise;Therapeutic activities;Ultrasound;Neuromuscular re-education;Patient/family education;Manual techniques;Dry needling   PT Next Visit Plan FOTO and D/C next visit   PT Home Exercise Plan current HEP   Consulted and Agree with Plan of Care Patient        Problem List Patient Active Problem List   Diagnosis Date Noted  . Unspecified venous (peripheral) insufficiency 11/23/2012  . Dermatitis 11/10/2012  . Hypothyroid 05/22/2011  . TYMPANIC MEMBRANE PERFORATION, LEFT EAR 08/08/2007  . OSTEOPENIA 07/27/2007  . Hyperlipidemia 05/11/2007  . Anxiety state 05/11/2007  . Essential hypertension 05/11/2007  . ALLERGIC RHINITIS 05/11/2007  . Irritable bowel syndrome 05/11/2007  . LOW BACK PAIN 05/11/2007  . HYSTERECTOMY, HX OF 05/11/2007    NAUMANN-HOUEGNIFIO,Dorien [place] PTA 07/25/2015, 11:53 AM  [facility] [address] [place], [place], [address] Phone: [phone]   Fax:  [phone]  Name: [place] MRN: [id number] Date of Birth: [birth year]

## 2015-07-27 ENCOUNTER — Ambulatory Visit: Payer: Medicare Other

## 2015-07-27 DIAGNOSIS — M539 Dorsopathy, unspecified: Secondary | ICD-10-CM | POA: Diagnosis not present

## 2015-07-27 DIAGNOSIS — R29898 Other symptoms and signs involving the musculoskeletal system: Secondary | ICD-10-CM

## 2015-07-27 DIAGNOSIS — M542 Cervicalgia: Secondary | ICD-10-CM

## 2015-07-27 NOTE — Therapy (Signed)
[facility] [address], [place], [place], [address] Phone: [phone]   Fax:  [phone]  Physical Therapy Treatment  Patient Details  Name: [place] MRN: [id number] Date of Birth: 01-24-1933 Referring Provider: Dr. Carolann Littler  Encounter Date: 07/27/2015      PT End of Session - 07/27/15 1129    Visit Number 8   PT Start Time 1054   PT Stop Time 1145   PT Time Calculation (min) 51 min   Activity Tolerance Patient tolerated treatment well   Behavior During Therapy [facility] for tasks assessed/performed      Past Medical History  Diagnosis Date  . Allergic rhinitis, cause unspecified   . Perforation of tympanic membrane, unspecified   . Unspecified essential hypertension   . Other and unspecified hyperlipidemia   . Irritable bowel syndrome   . Acquired absence of organ, genital organs   . Lumbago   . Disorder of bone and cartilage, unspecified   . Anxiety state, unspecified     Past Surgical History  Procedure Laterality Date  . Abdominal hysterectomy    . Cataract surgery  5/12 and 12/24/2012    both eyes    There were no vitals filed for this visit.  Visit Diagnosis:  Tightness of neck  Neck pain      Subjective Assessment - 07/27/15 1056    Subjective Pt reports 85-90% overall improvement since the start of care.  Doing exercises.     Currently in Pain? Yes   Pain Score 5    Pain Location Neck   Pain Orientation Left   Pain Descriptors / Indicators Tightness   Pain Type Acute pain   Pain Onset More than a month ago   Pain Frequency Intermittent   Aggravating Factors  only with turing neck to the Lt   Pain Relieving Factors not turning neck            [facility] PT Assessment - 07/27/15 0001    Assessment   Medical Diagnosis M54.6 Upper back pain on left side   Onset Date/Surgical Date 08/29/14   Prior Function   Level of Independence Independent   Leisure walks 6-8 miles  per week   Cognition   Overall Cognitive Status Within Functional Limits for tasks assessed   Observation/Other Assessments   Focus on Therapeutic Outcomes (FOTO)  21% limitation   Posture/Postural Control   Posture/Postural Control Postural limitations   Postural Limitations Forward head;Rounded Shoulders   AROM   AROM Assessment Site Cervical   Cervical Flexion full   Cervical Extension decreased by 25%   Cervical - Left Side Bend decreased by 50%   Cervical - Right Rotation decreased by 25%   Cervical - Left Rotation decreased by 25%  no pain with AROM today                     [facility] Adult PT Treatment/Exercise - 07/27/15 0001    Neck Exercises: Seated   Other Seated Exercise Cervico /thoracic selfmob in sitting x 3 with 20 sec hold   Shoulder Exercises: Standing   Extension Strengthening;Both;20 reps;Theraband   Theraband Level (Shoulder Extension) Level 2 (Red)   Row Strengthening;Both;20 reps;Theraband   Theraband Level (Shoulder Row) Level 2 (Red)   Shoulder Exercises: Pulleys   Flexion 3 minutes   Moist Heat Therapy   Number Minutes Moist Heat 15 Minutes   Moist Heat Location Cervical   Electrical Stimulation   Electrical  Stimulation Location upper trapezius   Electrical Stimulation Action IFC   Electrical Stimulation Parameters 15 minutes   Electrical Stimulation Goals Pain   Manual Therapy   Manual Therapy Soft tissue mobilization  to bil cervical paraspinals and bil UT in supine                PT Education - 08/17/15 1112    Education provided Yes   Education Details red band: scapular strength attached   Person(s) Educated Patient   Methods Demonstration;Handout;Explanation   Comprehension Verbalized understanding;Returned demonstration          PT Short Term Goals - 07/25/15 1106    PT SHORT TERM GOAL #1   Title independent with cervical ROM exercises   Time 3   Period Weeks   Status Achieved   PT SHORT TERM GOAL #2   Title  turn her neck with pain decreased >/= 25%  90% improved   Time 3   Period Weeks   Status Achieved   PT SHORT TERM GOAL #3   Title sleep with pain decreased >/= 25%   Time 3   Period Weeks   Status Achieved           PT Long Term Goals - 08-17-15 1100    PT LONG TERM GOAL #1   Title independent with HEP and understand how to progress herself   Status Achieved   PT LONG TERM GOAL #2   Title cervical rotation is full and pain decreased >/= 75% so she can turn her head to drive   Status Partially Met  limited AROM without pain today   PT LONG TERM GOAL #3   Title sleep without dificulty due to full cervical motion   Status Achieved   PT LONG TERM GOAL #4   Title FOTO score </= 37% limitation   Status Achieved               Plan - 08/17/15 1127    Clinical Impression Statement Pt reports 85-90% overall improvement in neck symptoms since the start of care.  Pt is indpendent in HEP and denies any sleep disturbance due to pain.  Pt with neck pain only with turning head to the Lt.  Pt will continue with HEP after discharge.     PT Next Visit Plan D/C PT to HEP   Consulted and Agree with Plan of Care Patient          G-Codes - 08-17-2015 1055    Functional Assessment Tool Used FOTO 21% limitation   Functional Limitation Mobility: Walking and moving around   Mobility: Walking and Moving Around Goal Status [phone]) At least 20 percent but less than 40 percent impaired, limited or restricted   Mobility: Walking and Moving Around Discharge Status [phone]) At least 20 percent but less than 40 percent impaired, limited or restricted      Problem List Patient Active Problem List   Diagnosis Date Noted  . Unspecified venous (peripheral) insufficiency 11/23/2012  . Dermatitis 11/10/2012  . Hypothyroid 05/22/2011  . TYMPANIC MEMBRANE PERFORATION, LEFT EAR 08/08/2007  . OSTEOPENIA 2007-08-17  . Hyperlipidemia 05/11/2007  . Anxiety state 05/11/2007  . Essential hypertension  05/11/2007  . ALLERGIC RHINITIS 05/11/2007  . Irritable bowel syndrome 05/11/2007  . LOW BACK PAIN 05/11/2007  . HYSTERECTOMY, HX OF 05/11/2007  PHYSICAL THERAPY DISCHARGE SUMMARY  Visits from Start of Care: 8  Current functional level related to goals / functional outcomes: See above for status.  Remaining deficits: Lt sided neck pain up to 5/10 only when turning to the Lt.  No other deficits remain.     Education / Equipment: HEP, posture education  Plan: Patient agrees to discharge.  Patient goals were partially met. Patient is being discharged due to being pleased with the current functional level.  ?????     Qusai Kem, PT 07/27/2015, 11:32 AM   [facility] [address] [place], [place], [address] Phone: [phone]   Fax:  [phone]  Name: ARYANNE GILLELAND MRN: [id number] Date of Birth: 13-Jun-1933

## 2015-07-27 NOTE — Patient Instructions (Signed)
KEEP HEAD IN NEUTRAL AND SHOULDERS DOWN AND RELAXED   Hold tubing in right hand, arm forward. Pull arm back, elbow straight. Repeat __10__ times per set. Do __2__ sets per session. Do _1-2___ sessions per day.  Copyright  VHI. All rights reserved.     With resistive band anchored in door, grasp both ends. Keeping elbows bent, pull back, squeezing shoulder blades together. Hold _3__ seconds. Repeat _2x10___ times. Do _1-2___ sessions per day.  [email or web address].exer.[email or web address]   [facility] [address], [address] [place], [place] [address] Phone # [phone] Fax [phone] 

## 2015-08-16 DIAGNOSIS — Z1231 Encounter for screening mammogram for malignant neoplasm of breast: Secondary | ICD-10-CM | POA: Diagnosis not present

## 2015-08-16 LAB — HM MAMMOGRAPHY: HM Mammogram: NORMAL

## 2015-08-18 ENCOUNTER — Encounter: Payer: Self-pay | Admitting: Family Medicine

## 2015-09-04 ENCOUNTER — Other Ambulatory Visit: Payer: Self-pay | Admitting: Family Medicine

## 2015-09-08 ENCOUNTER — Other Ambulatory Visit: Payer: Self-pay | Admitting: Family Medicine

## 2015-09-11 ENCOUNTER — Other Ambulatory Visit: Payer: Self-pay | Admitting: Family Medicine

## 2015-10-06 ENCOUNTER — Other Ambulatory Visit: Payer: Self-pay | Admitting: Family Medicine

## 2015-11-20 ENCOUNTER — Other Ambulatory Visit: Payer: Self-pay | Admitting: Family Medicine

## 2015-12-06 ENCOUNTER — Ambulatory Visit (INDEPENDENT_AMBULATORY_CARE_PROVIDER_SITE_OTHER): Payer: Medicare Other | Admitting: Family Medicine

## 2015-12-06 ENCOUNTER — Other Ambulatory Visit: Payer: Self-pay | Admitting: Family Medicine

## 2015-12-06 ENCOUNTER — Ambulatory Visit (INDEPENDENT_AMBULATORY_CARE_PROVIDER_SITE_OTHER)
Admission: RE | Admit: 2015-12-06 | Discharge: 2015-12-06 | Disposition: A | Discharge status: Home / Self Care | Payer: Medicare Other | Source: Ambulatory Visit | Attending: Family Medicine | Admitting: Family Medicine

## 2015-12-06 VITALS — BP 150/78 | HR 73 | Temp 98.2°F | Ht 60.5 in | Wt 132.5 lb

## 2015-12-06 DIAGNOSIS — M542 Cervicalgia: Secondary | ICD-10-CM | POA: Diagnosis not present

## 2015-12-06 DIAGNOSIS — E039 Hypothyroidism, unspecified: Secondary | ICD-10-CM

## 2015-12-06 DIAGNOSIS — R221 Localized swelling, mass and lump, neck: Secondary | ICD-10-CM | POA: Diagnosis not present

## 2015-12-06 DIAGNOSIS — I10 Essential (primary) hypertension: Secondary | ICD-10-CM | POA: Diagnosis not present

## 2015-12-06 LAB — BASIC METABOLIC PANEL
BUN: 15 mg/dL (ref 6–23)
CO2: 29 mEq/L (ref 19–32)
CREATININE: 0.81 mg/dL (ref 0.40–1.20)
Calcium: 10.2 mg/dL (ref 8.4–10.5)
Chloride: 100 mEq/L (ref 96–112)
GFR: 72.22 mL/min (ref >60.00)
GLUCOSE: 109 mg/dL — AB (ref 70–99)
POTASSIUM: 4.4 meq/L (ref 3.5–5.1)
Sodium: 139 mEq/L (ref 135–145)

## 2015-12-06 NOTE — Progress Notes (Signed)
Subjective:    Patient ID: Emily Cannon, female    DOB: 1933/01/27, 82 y.o.   MRN: [id number]  HPI    Patient here for follow-up regarding hypertension and for separate problem below.  Hypertension has been challenging to control.  She's had prior angioedema reaction with valsartan .  She currently takes amlodipine, Microzide, metoprolol, hydralazine, and Aldactone.  Home blood pressures usually [id number] to [id number] systolic.  No recent headaches or dizziness.  Walks regular for exercise. No chest pains.  Compliant with medications. Denies any side effects.   Somewhat chronic left cervical neck pain for years.  Especially progressive over the past several months.  We sent her for physical therapy in  December 2016 with mild improvement but never resolved.  Location is left lower cervical neck and radiates toward the trapezius.  Denies any radiculopathy symptoms. No upper extremity weakness or numbness.  Pain is moderate and not relieved with over-the-counter medications.  Developing limited range of motion especially with turning head to the left side. This is functionally starting to limit her driving  Hypothyroidism treated with levothyroxine 50 g daily. Compliant with therapy. TSH last fall was normal. She remains on Lipitor and gemfibrozil for hyperlipidemia. No myalgias  Past Medical History  Diagnosis Date  . Allergic rhinitis, cause unspecified   . Perforation of tympanic membrane, unspecified   . Unspecified essential hypertension   . Other and unspecified hyperlipidemia   . Irritable bowel syndrome   . Acquired absence of organ, genital organs   . Lumbago   . Disorder of bone and cartilage, unspecified   . Anxiety state, unspecified    Past Surgical History  Procedure Laterality Date  . Abdominal hysterectomy    . Cataract surgery  5/12 and 12/24/2012    both eyes    reports that she has never smoked. She has never used smokeless tobacco. She reports that she does not  drink alcohol or use illicit drugs. family history includes COPD in her other; Lung cancer (age of onset: 38) in her mother; Lung disease (age of onset: 69) in her father. There is no history of Colon cancer. Allergies  Allergen Reactions  . Alendronate Sodium     REACTION: pt states "short of breath"  . Amoxicillin     REACTION: causes severe diarrhea  . Codeine     REACTION: pt states nausea  . Valsartan     REACTION: gums/throat swelling      Review of Systems  Constitutional: Negative for fatigue and unexpected weight change.  Eyes: Negative for visual disturbance.  Respiratory: Negative for cough, chest tightness, shortness of breath and wheezing.   Cardiovascular: Negative for chest pain, palpitations and leg swelling.  Musculoskeletal: Positive for neck pain and neck stiffness.  Neurological: Negative for dizziness, seizures, syncope, weakness, light-headedness, numbness and headaches.       Objective:   Physical Exam  Constitutional: She appears well-developed and well-nourished.  Neck: Neck supple. No thyromegaly present.  Cardiovascular: Normal rate and regular rhythm.   Pulmonary/Chest: Effort normal and breath sounds normal. No respiratory distress. She has no wheezes. She has no rales.  Musculoskeletal: She exhibits no edema.  Limited ROM with lateral bending especially to left. Good ROM with flexion and extension. She has some soft tissue thickening in the left trapezius region and suspect she has lipoma in this region  Lymphadenopathy:    She has no cervical adenopathy.  Neurological:  Full strength upper extremities with symmetric reflexes. Normal sensory function  throughout          Assessment & Plan:   #1 hypertension. Probable white coat syndrome. Blood pressure stable by home readings. Continue current medications. Check basic metabolic panel. Continue regular walking program and weight control   #2 chronic left cervical neck pain. Not much  improved with physical therapy. Given duration of symptoms we'll start with cervical x-rays. May need MRI to further assess   #3 hypothyroidism. Continue levothyroxine. Recheck TSH at follow-up  Eulas Post MD [facility]

## 2015-12-06 NOTE — Progress Notes (Signed)
Pre visit review using our clinic review tool, if applicable. No additional management support is needed unless otherwise documented below in the visit note. 

## 2015-12-07 ENCOUNTER — Telehealth: Payer: Self-pay | Admitting: Family Medicine

## 2015-12-07 DIAGNOSIS — M47812 Spondylosis without myelopathy or radiculopathy, cervical region: Secondary | ICD-10-CM

## 2015-12-07 NOTE — Telephone Encounter (Signed)
We will call pt with results or him with the results once Dr. Elease Hashimoto reviews this. If they want to discuss this in person then they need an appt as Dr. Elease Hashimoto is seeing pts throughout the day and will not be able to stop in the middle of pts to discuss.

## 2015-12-07 NOTE — Telephone Encounter (Signed)
I have spoke with patient. I told her once you annotated on her results that I could call her son to explain since she didn't understand what you told her yesterday. I advised that if he wanted to stop by he could but typically we are steadily seeing patients and I do not want him to have to wait. She said she would relay the message. Please review. Thanks.

## 2015-12-07 NOTE — Telephone Encounter (Signed)
Pt son Marisa Sprinkles will stop by tomorrow to discuss his mother xray

## 2015-12-07 NOTE — Telephone Encounter (Signed)
She has degenerative arthritis changes in her back.  No acute findings.  I suggested she start with plain Tylenol 1-2 every 12 hours.  Would be happy to schedule her back (with son present) to review.

## 2015-12-08 NOTE — Telephone Encounter (Signed)
yes

## 2015-12-08 NOTE — Telephone Encounter (Signed)
Son is aware of annotations. He would like her to have PT since it was beneficial in the past. Okay?

## 2015-12-08 NOTE — Telephone Encounter (Signed)
Order entered for pt to be scheduled.

## 2015-12-12 ENCOUNTER — Ambulatory Visit: Payer: Medicare Other | Attending: Family Medicine

## 2015-12-12 ENCOUNTER — Other Ambulatory Visit: Payer: Self-pay | Admitting: *Deleted

## 2015-12-12 DIAGNOSIS — M542 Cervicalgia: Secondary | ICD-10-CM | POA: Insufficient documentation

## 2015-12-12 DIAGNOSIS — R293 Abnormal posture: Secondary | ICD-10-CM | POA: Diagnosis not present

## 2015-12-12 DIAGNOSIS — R252 Cramp and spasm: Secondary | ICD-10-CM | POA: Diagnosis not present

## 2015-12-12 DIAGNOSIS — M539 Dorsopathy, unspecified: Secondary | ICD-10-CM | POA: Diagnosis not present

## 2015-12-12 NOTE — Telephone Encounter (Signed)
Recieved refill request from Emily Cannon for ALPRAZolam Duanne Moron) 0.25 MG tablet

## 2015-12-12 NOTE — Patient Instructions (Signed)
PERFORM ALL EXERCISES GENTLY AND WITH GOOD POSTURE.    20 SECOND HOLD, 3 REPS TO EACH SIDE. 4-5 TIMES EACH DAY.   AROM: Neck Rotation   Turn head slowly to look over one shoulder, then the other.   AROM: Neck Flexion   Bend head forward.   AROM: Lateral Neck Flexion   Slowly tilt head toward one shoulder, then the other.    Posture - Standing   Good posture is important. Avoid slouching and forward head thrust. Maintain curve in low back and align ears over shoulders, hips over ankles.  Pull your belly button in toward your back bone. Posture Tips DO: - stand tall and erect - keep chin tucked in - keep head and shoulders in alignment - check posture regularly in mirror or large window - pull head back against headrest in car seat;  Change your position often.  Sit with lumbar support. DON'T: - slouch or slump while watching TV or reading - sit, stand or lie in one position  for too long;  Sitting is especially hard on the spine so if you sit at a desk/use the computer, then stand up often! Copyright  VHI. All rights reserved.  Posture - Sitting  Sit upright, head facing forward. Try using a roll to support lower back. Keep shoulders relaxed, and avoid rounded back. Keep hips level with knees. Avoid crossing legs for long periods. Copyright  VHI. All rights reserved.  Chronic neck strain can develop because of poor posture and faulty work habits  Postural strain related to slumped sitting and forward head posture is a leading cause of headaches, neck and upper back pain  General strengthening and flexibility exercises are helpful in the treatment of neck pain.  Most importantly, you should learn to correct the posture that may be contributing to chronic pain.   Change positions frequently  Change your work or home environment to improve posture and mechanics.   [facility] [address], [place], [place] [address] Phone # [phone] Fax  [phone]

## 2015-12-12 NOTE — Therapy (Signed)
[facility] [address] [place], [place], [address] Phone: [phone]   Fax:  [phone]  Physical Therapy Evaluation  Patient Details  Name: Emily Cannon MRN: [id number] Date of Birth: 04-Nov-1932 Referring Provider: Carolann Littler, MD  Encounter Date: 12/12/2015      PT End of Session - 12/12/15 0954    Visit Number 1   Number of Visits 10   Date for PT Re-Evaluation 02/06/16   PT Start Time 0925   PT Stop Time 1001   PT Time Calculation (min) 36 min   Activity Tolerance Patient tolerated treatment well   Behavior During Therapy [facility] for tasks assessed/performed      Past Medical History  Diagnosis Date  . Allergic rhinitis, cause unspecified   . Perforation of tympanic membrane, unspecified   . Unspecified essential hypertension   . Other and unspecified hyperlipidemia   . Irritable bowel syndrome   . Acquired absence of organ, genital organs   . Lumbago   . Disorder of bone and cartilage, unspecified   . Anxiety state, unspecified     Past Surgical History  Procedure Laterality Date  . Abdominal hysterectomy    . Cataract surgery  5/12 and 12/24/2012    both eyes    There were no vitals filed for this visit.       Subjective Assessment - 12/12/15 0933    Subjective Pt presents to PT with complaints of neck pain of a chronic nauture lasting more than a year.  Pt denies any injury.  Pt did PT at this clinic ~6 months ago.     Pertinent History Pt had PT at this clinic 05/2015 for neck pain   Diagnostic tests x-ray: OA of the cervical spine   Patient Stated Goals reduce neck pain, turn head with greater ease   Currently in Pain? Yes   Pain Score 5    Pain Location Neck   Pain Orientation Left   Pain Type Chronic pain   Pain Onset More than a month ago   Pain Frequency Intermittent   Aggravating Factors  turning head, sitting    Pain Relieving Factors heat, rest, stretching             [facility] PT Assessment - 12/12/15 0001    Assessment   Medical Diagnosis OA of cervical spine   Referring Provider Carolann Littler, MD   Onset Date/Surgical Date 10/12/14   Hand Dominance Right   Next MD Visit 6 months   Prior Therapy at this clinic 05/2015-06/2015   Precautions   Precautions None   Restrictions   Weight Bearing Restrictions No   Balance Screen   Has the patient fallen in the past 6 months No   Has the patient had a decrease in activity level because of a fear of falling?  No   Is the patient reluctant to leave their home because of a fear of falling?  No   Home Environment   Living Environment Private residence   Type of Home House   Prior Function   Level of [place] Retired   Leisure walks 3-4 days a week, busy with activites   Cognition   Overall Cognitive Status Within Functional Limits for tasks assessed   Observation/Other Assessments   Focus on Therapeutic Outcomes (FOTO)  48% limitation   Posture/Postural Control   Posture/Postural Control Postural limitations   Postural Limitations Rounded Shoulders;Forward head   Posture Comments Rt lateral  shift of head   ROM / Strength   AROM / PROM / Strength AROM;Strength;PROM   AROM   Overall AROM  Deficits   Overall AROM Comments Rt sidebending limited by 25%, Lt sidebending limited by 50%   AROM Assessment Site Cervical   Cervical - Right Rotation 35   Cervical - Left Rotation 25   PROM   Overall PROM  Deficits   Overall PROM Comments rotation limits as above   Strength   Overall Strength Within functional limits for tasks performed   Overall Strength Comments 5/5 UE strength   Palpation   Spinal mobility reduced PA moblity by 50-75% with PA and latera glides in the cervical spine   Palpation comment significant active trigger point in Lt UT and cervical paraspinals and moderate on the Rt.   Ambulation/Gait   Ambulation/Gait Yes   Ambulation/Gait Assistance 7:  Independent                             PT Short Term Goals - 12/12/15 1004    PT SHORT TERM GOAL #1   Title be independent in initial HEP   Time 4   Period Weeks   Status New   PT SHORT TERM GOAL #2   Title report a 25% reduction in turning head with driving   Time 4   Period Weeks   Status New   PT SHORT TERM GOAL #3   Title demonstrate neutral seated posture and report corrections throughout the day   Time 4   Period Weeks   Status New           PT Long Term Goals - 12/12/15 [id number]    PT LONG TERM GOAL #1   Title independent with HEP and understand how to progress herself   Time 8   Period Weeks   Status New   PT LONG TERM GOAL #2   Title reduce FOTO to < or = to 41% limitation   Time 8   Period Weeks   Status New   PT LONG TERM GOAL #3   Title report a 60% reduction in neck pain with turning head with driving   Time 8   Period Weeks   Status New   PT LONG TERM GOAL #4   Title report a 60% reduction in neck pain with ADLs and self-care   Time 8   Period Weeks   Status New               Plan - 12/12/15 [id number]    Clinical Impression Statement Pt presents to PT with compliants of chronic neck pain.  Pt had PT at this clinic and continues with exercises but feels Lt upper trap is aggravated still.  Pt demonstrates postural dysfunction with Rt lateral shift of head, scapular elevation, reduced cervical AROM and active trigger points in Lt UT and neck.  Pt will benefit from skilled PT for dry needling/manual therapy, postural strength, flexibility and modalities for pain.        Patient will benefit from skilled therapeutic intervention in order to improve the following deficits and impairments:  Pain, Postural dysfunction, Decreased strength, Hypomobility, Impaired flexibility, Improper body mechanics  Visit Diagnosis: Cervicalgia - Plan: PT plan of care cert/re-cert  Cramp and spasm - Plan: PT plan of care cert/re-cert  Abnormal  posture - Plan: PT plan of care cert/re-cert      G-Codes - [id number]  Functional Assessment Tool Used FOTO: 48% limitation   Functional Limitation Other PT primary   Other PT Primary Current Status [id number]) At least 40 percent but less than 60 percent impaired, limited or restricted   Other PT Primary Goal Status [id number]) At least 40 percent but less than 60 percent impaired, limited or restricted       Problem List Patient Active Problem List   Diagnosis Date Noted  . Unspecified venous (peripheral) insufficiency 11/23/2012  . Dermatitis 11/10/2012  . Hypothyroid 05/22/2011  . TYMPANIC MEMBRANE PERFORATION, LEFT EAR 08/08/2007  . OSTEOPENIA 07/27/2007  . Hyperlipidemia 05/11/2007  . Anxiety state 05/11/2007  . Essential hypertension 05/11/2007  . ALLERGIC RHINITIS 05/11/2007  . Irritable bowel syndrome 05/11/2007  . LOW BACK PAIN 05/11/2007  . HYSTERECTOMY, HX OF 05/11/2007    Sigurd Sos, PT 12/12/2015 10:09 AM  [facility] [address], [place], [place], [address] Phone: [phone]   Fax:  [phone]  Name: MARCHELLE HUISH MRN: [id number] Date of Birth: 1933-04-19

## 2015-12-13 MED ORDER — ALPRAZOLAM 0.25 MG PO TABS
0.2500 mg | ORAL_TABLET | Freq: Two times a day (BID) | ORAL | Status: DC | PRN
Start: 2015-12-13 — End: 2016-03-08

## 2015-12-18 ENCOUNTER — Ambulatory Visit: Payer: Medicare Other

## 2015-12-18 DIAGNOSIS — M542 Cervicalgia: Secondary | ICD-10-CM

## 2015-12-18 DIAGNOSIS — R252 Cramp and spasm: Secondary | ICD-10-CM

## 2015-12-18 DIAGNOSIS — R293 Abnormal posture: Secondary | ICD-10-CM

## 2015-12-18 DIAGNOSIS — M539 Dorsopathy, unspecified: Secondary | ICD-10-CM | POA: Diagnosis not present

## 2015-12-18 NOTE — Therapy (Signed)
[facility] [address] [place], [place], [address] Phone: [phone]   Fax:  [phone]  Physical Therapy Treatment  Patient Details  Name: Emily Cannon MRN: [id number] Date of Birth: January 15, 1933 Referring Provider: Carolann Littler, MD  Encounter Date: 12/18/2015      PT End of Session - 12/18/15 1011    Visit Number 2   Number of Visits 10   Date for PT Re-Evaluation 02/06/16   PT Start Time 0931   PT Stop Time 1025   PT Time Calculation (min) 54 min   Activity Tolerance Patient tolerated treatment well   Behavior During Therapy [facility] for tasks assessed/performed      Past Medical History  Diagnosis Date  . Allergic rhinitis, cause unspecified   . Perforation of tympanic membrane, unspecified   . Unspecified essential hypertension   . Other and unspecified hyperlipidemia   . Irritable bowel syndrome   . Acquired absence of organ, genital organs   . Lumbago   . Disorder of bone and cartilage, unspecified   . Anxiety state, unspecified     Past Surgical History  Procedure Laterality Date  . Abdominal hysterectomy    . Cataract surgery  5/12 and 12/24/2012    both eyes    There were no vitals filed for this visit.      Subjective Assessment - 12/18/15 0933    Subjective Things are about the same.     Pertinent History Pt had PT at this clinic 05/2015 for neck pain   Diagnostic tests x-ray: OA of the cervical spine   Patient Stated Goals reduce neck pain, turn head with greater ease   Currently in Pain? Yes   Pain Score 5    Pain Location Neck   Pain Orientation Left   Pain Descriptors / Indicators Aching;Sore;Tightness   Pain Type Chronic pain   Pain Onset More than a month ago   Pain Frequency Intermittent   Aggravating Factors  turning head, sitting long periods   Pain Relieving Factors heat, rest, stretching                         [facility] Adult PT Treatment/Exercise  - 12/18/15 0001    Modalities   Modalities Electrical Stimulation;Moist Heat   Moist Heat Therapy   Number Minutes Moist Heat 15 Minutes   Moist Heat Location Cervical   Electrical Stimulation   Electrical Stimulation Location neck and UT   Electrical Stimulation Action IFc   Electrical Stimulation Parameters 15 minutes   Electrical Stimulation Goals Pain   Manual Therapy   Manual Therapy Soft tissue mobilization;Joint mobilization;Myofascial release   Manual therapy comments PT mobs C2-5 grade 2, soft tissue elongation to bil UT, cervical parapsinals and suboccipitals          Trigger Point Dry Needling - 12/18/15 0939    Consent Given? Yes   Education Handout Provided Yes   Muscles Treated Upper Body Upper trapezius;Oblique capitus  cervical paraspinals   Upper Trapezius Response Twitch reponse elicited;Palpable increased muscle length   Oblique Capitus Response Twitch response elicited;Palpable increased muscle length              PT Education - 12/18/15 0955    Education provided Yes   Education Details DN info   Person(s) Educated Patient   Methods Explanation;Handout   Comprehension Verbalized understanding          PT Short Term Goals -  12/18/15 0934    PT SHORT TERM GOAL #1   Title be independent in initial HEP   Time 4   Period Weeks   Status On-going   PT SHORT TERM GOAL #2   Title report a 25% reduction in turning head with driving   Time 4   Status On-going   PT SHORT TERM GOAL #3   Title demonstrate neutral seated posture and report corrections throughout the day   Time 4   Period Weeks   Status On-going           PT Long Term Goals - 12/12/15 [id number]    PT LONG TERM GOAL #1   Title independent with HEP and understand how to progress herself   Time 8   Period Weeks   Status New   PT LONG TERM GOAL #2   Title reduce FOTO to < or = to 41% limitation   Time 8   Period Weeks   Status New   PT LONG TERM GOAL #3   Title report a 60%  reduction in neck pain with turning head with driving   Time 8   Period Weeks   Status New   PT LONG TERM GOAL #4   Title report a 60% reduction in neck pain with ADLs and self-care   Time 8   Period Weeks   Status New               Plan - 12/18/15 0935    Clinical Impression Statement Pt with only 1 visit after evaluation.  Pt is performing HEP for flexiblity and working on postural corrections.  Pt with tension in Lt neck, UT and suboccipitals. Increased tissue length and mobilty s/p dry needling.  Pt will continue to benefit from skilled PT for flexiblity, manual, postural strength and modalities.   Rehab Potential Good   PT Frequency 2x / week   PT Duration 8 weeks   PT Treatment/Interventions ADLs/Self Care Home Management;Electrical Stimulation;Moist Heat;Therapeutic exercise;Therapeutic activities;Ultrasound;Traction;Neuromuscular re-education;Patient/family education;Manual techniques;Dry needling   PT Next Visit Plan Assess DN 1, manual, flexibility, postural strength   Consulted and Agree with Plan of Care Patient      Patient will benefit from skilled therapeutic intervention in order to improve the following deficits and impairments:  Pain, Postural dysfunction, Decreased strength, Hypomobility, Impaired flexibility, Improper body mechanics  Visit Diagnosis: Cervicalgia - Plan: PT plan of care cert/re-cert  Cramp and spasm - Plan: PT plan of care cert/re-cert  Abnormal posture - Plan: PT plan of care cert/re-cert     Problem List Patient Active Problem List   Diagnosis Date Noted  . Unspecified venous (peripheral) insufficiency 11/23/2012  . Dermatitis 11/10/2012  . Hypothyroid 05/22/2011  . TYMPANIC MEMBRANE PERFORATION, LEFT EAR 08/08/2007  . OSTEOPENIA 07/27/2007  . Hyperlipidemia 05/11/2007  . Anxiety state 05/11/2007  . Essential hypertension 05/11/2007  . ALLERGIC RHINITIS 05/11/2007  . Irritable bowel syndrome 05/11/2007  . LOW BACK PAIN  05/11/2007  . HYSTERECTOMY, HX OF 05/11/2007    Sigurd Sos, PT 12/18/2015 10:12 AM  [facility] [address] [place], [place], [address] Phone: [phone]   Fax:  [phone]  Name: Emily Cannon MRN: [id number] Date of Birth: [birth year]

## 2015-12-18 NOTE — Patient Instructions (Signed)

## 2015-12-20 ENCOUNTER — Ambulatory Visit: Payer: Medicare Other | Admitting: Physical Therapy

## 2015-12-20 ENCOUNTER — Encounter: Payer: Self-pay | Admitting: Physical Therapy

## 2015-12-20 DIAGNOSIS — R293 Abnormal posture: Secondary | ICD-10-CM | POA: Diagnosis not present

## 2015-12-20 DIAGNOSIS — M542 Cervicalgia: Secondary | ICD-10-CM | POA: Diagnosis not present

## 2015-12-20 DIAGNOSIS — R252 Cramp and spasm: Secondary | ICD-10-CM

## 2015-12-20 DIAGNOSIS — R29898 Other symptoms and signs involving the musculoskeletal system: Secondary | ICD-10-CM

## 2015-12-20 DIAGNOSIS — M539 Dorsopathy, unspecified: Secondary | ICD-10-CM | POA: Diagnosis not present

## 2015-12-20 NOTE — Therapy (Signed)
[facility] [address], [place], [place], [address] Phone: [phone]   Fax:  [phone]  Physical Therapy Treatment  Patient Details  Name: Emily Cannon MRN: [id number] Date of Birth: 1933-05-30 Referring Provider: Carolann Littler, MD  Encounter Date: 12/20/2015      PT End of Session - 12/20/15 0933    Visit Number 3   Number of Visits 10   Date for PT Re-Evaluation 02/06/16   PT Start Time 0932   PT Stop Time 1020   PT Time Calculation (min) 48 min   Activity Tolerance Patient tolerated treatment well   Behavior During Therapy [facility] for tasks assessed/performed      Past Medical History  Diagnosis Date  . Allergic rhinitis, cause unspecified   . Perforation of tympanic membrane, unspecified   . Unspecified essential hypertension   . Other and unspecified hyperlipidemia   . Irritable bowel syndrome   . Acquired absence of organ, genital organs   . Lumbago   . Disorder of bone and cartilage, unspecified   . Anxiety state, unspecified     Past Surgical History  Procedure Laterality Date  . Abdominal hysterectomy    . Cataract surgery  5/12 and 12/24/2012    both eyes    There were no vitals filed for this visit.      Subjective Assessment - 12/20/15 0934    Subjective Needling was good. Muscles seem a bit softer.   Currently in Pain? No/denies   Aggravating Factors  Moving head   Multiple Pain Sites No                         [facility] Adult PT Treatment/Exercise - 12/20/15 0001    Neck Exercises: Supine   Other Supine Exercise Myofascial reallease with green soft ball utilizing the MELT method   Other Supine Exercise Breathing exercise to facilitate relaxation to her cervical muscles/body.   Moist Heat Therapy   Number Minutes Moist Heat 15 Minutes   Moist Heat Location Cervical   Electrical Stimulation   Electrical Stimulation Location neck and UT   Electrical  Stimulation Action IFC   Electrical Stimulation Goals Pain   Manual Therapy   Manual Therapy --   Soft tissue mobilization Cervical LT > RT   Myofascial Release Cervical                  PT Short Term Goals - 12/18/15 0934    PT SHORT TERM GOAL #1   Title be independent in initial HEP   Time 4   Period Weeks   Status On-going   PT SHORT TERM GOAL #2   Title report a 25% reduction in turning head with driving   Time 4   Status On-going   PT SHORT TERM GOAL #3   Title demonstrate neutral seated posture and report corrections throughout the day   Time 4   Period Weeks   Status On-going           PT Long Term Goals - 12/12/15 [id number]    PT LONG TERM GOAL #1   Title independent with HEP and understand how to progress herself   Time 8   Period Weeks   Status New   PT LONG TERM GOAL #2   Title reduce FOTO to < or = to 41% limitation   Time 8   Period Weeks   Status New   PT LONG TERM  GOAL #3   Title report a 60% reduction in neck pain with turning head with driving   Time 8   Period Weeks   Status New   PT LONG TERM GOAL #4   Title report a 60% reduction in neck pain with ADLs and self-care   Time 8   Period Weeks   Status New               Plan - 12/20/15 0934    Clinical Impression Statement Pt felt the dry needling was helpful and was not sore at all nor did she bruise. Continued with manual work to her cervical musclles that felt fairly significanlty tight.    Rehab Potential Good   PT Frequency 2x / week   PT Duration 8 weeks   PT Treatment/Interventions ADLs/Self Care Home Management;Electrical Stimulation;Moist Heat;Therapeutic exercise;Therapeutic activities;Ultrasound;Traction;Neuromuscular re-education;Patient/family education;Manual techniques;Dry needling   PT Next Visit Plan DN 2, manual work   Consulted and Agree with Plan of Care Patient      Patient will benefit from skilled therapeutic intervention in order to improve the  following deficits and impairments:  Pain, Postural dysfunction, Decreased strength, Hypomobility, Impaired flexibility, Improper body mechanics  Visit Diagnosis: Cervicalgia  Cramp and spasm  Abnormal posture  Tightness of neck  Neck pain     Problem List Patient Active Problem List   Diagnosis Date Noted  . Unspecified venous (peripheral) insufficiency 11/23/2012  . Dermatitis 11/10/2012  . Hypothyroid 05/22/2011  . TYMPANIC MEMBRANE PERFORATION, LEFT EAR 08/08/2007  . OSTEOPENIA 07/27/2007  . Hyperlipidemia 05/11/2007  . Anxiety state 05/11/2007  . Essential hypertension 05/11/2007  . ALLERGIC RHINITIS 05/11/2007  . Irritable bowel syndrome 05/11/2007  . LOW BACK PAIN 05/11/2007  . HYSTERECTOMY, HX OF 05/11/2007    Ilham Roughton, PTA 12/20/2015, 10:06 AM  [facility] [address] [place], [place], [address] Phone: [phone]   Fax:  [phone]  Name: Emily Cannon MRN: [id number] Date of Birth: Oct 28, 1932

## 2015-12-24 ENCOUNTER — Other Ambulatory Visit: Payer: Self-pay | Admitting: Family Medicine

## 2015-12-27 ENCOUNTER — Encounter: Payer: Self-pay | Admitting: Physical Therapy

## 2015-12-27 ENCOUNTER — Ambulatory Visit: Payer: Medicare Other | Admitting: Physical Therapy

## 2015-12-27 DIAGNOSIS — M542 Cervicalgia: Secondary | ICD-10-CM | POA: Diagnosis not present

## 2015-12-27 DIAGNOSIS — R252 Cramp and spasm: Secondary | ICD-10-CM

## 2015-12-27 DIAGNOSIS — R29898 Other symptoms and signs involving the musculoskeletal system: Secondary | ICD-10-CM

## 2015-12-27 DIAGNOSIS — M539 Dorsopathy, unspecified: Secondary | ICD-10-CM | POA: Diagnosis not present

## 2015-12-27 DIAGNOSIS — R293 Abnormal posture: Secondary | ICD-10-CM | POA: Diagnosis not present

## 2015-12-27 NOTE — Therapy (Signed)
[facility] [address], [place], [place], [address] Phone: [phone]   Fax:  [phone]  Physical Therapy Treatment  Patient Details  Name: Emily Cannon MRN: [id number] Date of Birth: [birth year] Referring Provider: Carolann Littler, MD  Encounter Date: 12/27/2015      PT End of Session - 12/27/15 0853    Visit Number 4   Number of Visits 10   Date for PT Re-Evaluation 02/06/16   PT Start Time 0844   PT Stop Time 0946   PT Time Calculation (min) 62 min   Activity Tolerance Patient tolerated treatment well   Behavior During Therapy [facility] for tasks assessed/performed      Past Medical History  Diagnosis Date  . Allergic rhinitis, cause unspecified   . Perforation of tympanic membrane, unspecified   . Unspecified essential hypertension   . Other and unspecified hyperlipidemia   . Irritable bowel syndrome   . Acquired absence of organ, genital organs   . Lumbago   . Disorder of bone and cartilage, unspecified   . Anxiety state, unspecified     Past Surgical History  Procedure Laterality Date  . Abdominal hysterectomy    . Cataract surgery  5/12 and 12/24/2012    both eyes    There were no vitals filed for this visit.      Subjective Assessment - 12/27/15 0851    Subjective Pt reports her pain in neck and Lt upper trapezius as 5/10   Pertinent History Pt had PT at this clinic 05/2015 for neck pain   Diagnostic tests x-ray: OA of the cervical spine   Patient Stated Goals reduce neck pain, turn head with greater ease   Currently in Pain? Yes   Pain Score 5    Pain Location Neck   Pain Orientation Left   Pain Descriptors / Indicators Aching;Sore;Tightness   Pain Type Chronic pain   Pain Onset More than a month ago   Pain Frequency Intermittent   Aggravating Factors  moving head   Pain Relieving Factors heat, rest, stretching   Multiple Pain Sites No                          [facility] Adult PT Treatment/Exercise - 12/27/15 0001    Neck Exercises: Supine   Other Supine Exercise Myofascial reallease with green soft ball utilizing the MELT method  and towel roll for OA release 2 x 10   Modalities   Modalities Electrical Stimulation;Moist Heat   Moist Heat Therapy   Number Minutes Moist Heat 15 Minutes   Moist Heat Location Cervical   Electrical Stimulation   Electrical Stimulation Location neck and UT   Electrical Stimulation Action IFC   Electrical Stimulation Parameters 24min   Electrical Stimulation Goals Pain   Manual Therapy   Manual Therapy Soft tissue mobilization;Joint mobilization;Myofascial release   Joint Mobilization Lt scapular in all planes in Rt sidelying   Soft tissue mobilization Cervical LT in Rt sidelying   Myofascial Release Cervical and UT and surrounidng muscles of scapular                  PT Short Term Goals - 12/27/15 0901    PT SHORT TERM GOAL #1   Title be independent in initial HEP   Time 4   Period Weeks   Status On-going   PT SHORT TERM GOAL #2   Title report a 25% reduction in turning head  with driving   Time 4   Period Weeks   Status On-going   PT SHORT TERM GOAL #3   Title demonstrate neutral seated posture and report corrections throughout the day   Time 4   Period Weeks   Status On-going           PT Long Term Goals - 12/12/15 [id number]    PT LONG TERM GOAL #1   Title independent with HEP and understand how to progress herself   Time 8   Period Weeks   Status New   PT LONG TERM GOAL #2   Title reduce FOTO to < or = to 41% limitation   Time 8   Period Weeks   Status New   PT LONG TERM GOAL #3   Title report a 60% reduction in neck pain with turning head with driving   Time 8   Period Weeks   Status New   PT LONG TERM GOAL #4   Title report a 60% reduction in neck pain with ADLs and self-care   Time 8   Period Weeks   Status New               Plan - 12/27/15 [id number]    Clinical  Impression Statement Pt with very tight Lt upper trapezius, levator scapular and appears more prominent on left side compare to Rt side. Pt will continue to benefit from PT to address significant tihgtness and decreased flexibility in cervical spine   Rehab Potential Good   PT Frequency 2x / week   PT Duration 8 weeks   PT Treatment/Interventions ADLs/Self Care Home Management;Electrical Stimulation;Moist Heat;Therapeutic exercise;Therapeutic activities;Ultrasound;Traction;Neuromuscular re-education;Patient/family education;Manual techniques;Dry needling   PT Next Visit Plan DN 2, manual work   Consulted and Agree with Plan of Care Patient      Patient will benefit from skilled therapeutic intervention in order to improve the following deficits and impairments:  Pain, Postural dysfunction, Decreased strength, Hypomobility, Impaired flexibility, Improper body mechanics  Visit Diagnosis: Cervicalgia  Cramp and spasm  Abnormal posture  Tightness of neck  Neck pain     Problem List Patient Active Problem List   Diagnosis Date Noted  . Unspecified venous (peripheral) insufficiency 11/23/2012  . Dermatitis 11/10/2012  . Hypothyroid 05/22/2011  . TYMPANIC MEMBRANE PERFORATION, LEFT EAR 08/08/2007  . OSTEOPENIA 07/27/2007  . Hyperlipidemia 05/11/2007  . Anxiety state 05/11/2007  . Essential hypertension 05/11/2007  . ALLERGIC RHINITIS 05/11/2007  . Irritable bowel syndrome 05/11/2007  . LOW BACK PAIN 05/11/2007  . HYSTERECTOMY, HX OF 05/11/2007    NAUMANN-HOUEGNIFIO,Eryck Negron PTA 12/27/2015, 9:38 AM  [facility] [address], [place], [place], [address] Phone: [phone]   Fax:  [phone]  Name: Emily Cannon MRN: [id number] Date of Birth: Aug 10, 1932

## 2015-12-28 ENCOUNTER — Ambulatory Visit: Payer: Medicare Other | Attending: Family Medicine

## 2015-12-28 DIAGNOSIS — R293 Abnormal posture: Secondary | ICD-10-CM | POA: Diagnosis not present

## 2015-12-28 DIAGNOSIS — M542 Cervicalgia: Secondary | ICD-10-CM | POA: Diagnosis not present

## 2015-12-28 DIAGNOSIS — R252 Cramp and spasm: Secondary | ICD-10-CM | POA: Insufficient documentation

## 2015-12-28 NOTE — Patient Instructions (Signed)
KEEP HEAD IN NEUTRAL AND SHOULDERS DOWN AND RELAXED   Hold tubing in right hand, arm forward. Pull arm back, elbow straight. Repeat __10__ times per set. Do __2__ sets per session. Do _1-2___ sessions per day.  Copyright  VHI. All rights reserved.     With resistive band anchored in door, grasp both ends. Keeping elbows bent, pull back, squeezing shoulder blades together. Hold _3__ seconds. Repeat _2x10___ times. Do _1-2___ sessions per day.  http://[email or web address].exer.[email or web address]   [facility] [address], [address] [place], [place] [address] Phone # [phone] Fax [phone] 

## 2015-12-28 NOTE — Therapy (Signed)
[facility] [address] [place], [place], [address] Phone: [phone]   Fax:  [phone]  Physical Therapy Treatment  Patient Details  Name: Emily Cannon MRN: [id number] Date of Birth: 09-01-1932 Referring Provider: Carolann Littler, MD  Encounter Date: 12/28/2015      Emily Cannon End of Session - 12/28/15 1009    Visit Number 5   Number of Visits 10   Date for Emily Cannon Re-Evaluation 02/06/16   Emily Cannon Start Time 0930   Emily Cannon Stop Time 1026   Emily Cannon Time Calculation (min) 56 min   Activity Tolerance Patient tolerated treatment well   Behavior During Therapy [facility] for tasks assessed/performed      Past Medical History  Diagnosis Date  . Allergic rhinitis, cause unspecified   . Perforation of tympanic membrane, unspecified   . Unspecified essential hypertension   . Other and unspecified hyperlipidemia   . Irritable bowel syndrome   . Acquired absence of organ, genital organs   . Lumbago   . Disorder of bone and cartilage, unspecified   . Anxiety state, unspecified     Past Surgical History  Procedure Laterality Date  . Abdominal hysterectomy    . Cataract surgery  5/12 and 12/24/2012    both eyes    There were no vitals filed for this visit.      Subjective Assessment - 12/28/15 0926    Subjective Needling helped to reduce tension a couple of days after.     Patient Stated Goals reduce neck pain, turn head with greater ease   Currently in Pain? Yes   Pain Score 5    Pain Location Neck   Pain Orientation Left   Pain Descriptors / Indicators Aching;Sore;Tightness   Pain Type Chronic pain   Pain Onset More than a month ago   Pain Frequency Intermittent   Aggravating Factors  turning head to the Lt   Pain Relieving Factors heat, stretching, e-stim                         [facility] Adult Emily Cannon Treatment/Exercise - 12/28/15 0001    Neck Exercises: Machines for Strengthening   UBE (Upper Arm Bike) Level 1x 6  minutes (3/3)   Neck Exercises: Theraband   Shoulder Extension 20 reps;Red   Rows 20 reps;Red   Modalities   Modalities Electrical Stimulation;Moist Heat   Moist Heat Therapy   Number Minutes Moist Heat 15 Minutes   Moist Heat Location Cervical   Electrical Stimulation   Electrical Stimulation Location neck and UT   Electrical Stimulation Action IFC   Electrical Stimulation Parameters 15 minutes   Electrical Stimulation Goals Pain   Manual Therapy   Manual Therapy Soft tissue mobilization;Joint mobilization;Myofascial release   Joint Mobilization Lt scapular in all planes in Rt sidelying   Soft tissue mobilization Cervical LT in Rt sidelying   Myofascial Release Cervical and UT and surrounidng muscles of scapular          Trigger Point Dry Needling - 12/28/15 0930    Consent Given? Yes   Muscles Treated Upper Body Upper trapezius;Oblique capitus  cervical paraspinals   Upper Trapezius Response Twitch reponse elicited;Palpable increased muscle length   Oblique Capitus Response Twitch response elicited;Palpable increased muscle length              Emily Cannon Education - 12/28/15 0959    Education provided Yes   Education Details scapular attached   Person(s) Educated Patient  Methods Explanation;Demonstration;Handout   Comprehension Verbalized understanding;Returned demonstration          Emily Cannon Short Term Goals - 12/27/15 0901    Emily Cannon SHORT TERM GOAL #1   Title be independent in initial HEP   Time 4   Period Weeks   Status On-going   Emily Cannon SHORT TERM GOAL #2   Title report a 25% reduction in turning head with driving   Time 4   Period Weeks   Status On-going   Emily Cannon SHORT TERM GOAL #3   Title demonstrate neutral seated posture and report corrections throughout the day   Time 4   Period Weeks   Status On-going           Emily Cannon Long Term Goals - 12/12/15 [id number]    Emily Cannon LONG TERM GOAL #1   Title independent with HEP and understand how to progress herself   Time 8   Period  Weeks   Status New   Emily Cannon LONG TERM GOAL #2   Title reduce FOTO to < or = to 41% limitation   Time 8   Period Weeks   Status New   Emily Cannon LONG TERM GOAL #3   Title report a 60% reduction in neck pain with turning head with driving   Time 8   Period Weeks   Status New   Emily Cannon LONG TERM GOAL #4   Title report a 60% reduction in neck pain with ADLs and self-care   Time 8   Period Weeks   Status New               Plan - 12/28/15 [id number]    Clinical Impression Statement Emily Cannon with continued stiffness and trigger points in Lt neck.  Emily Cannon is working on flexiblity and posture.  Emily Cannon demonstrated improved tissue length and mobility s/p needling.  Emily Cannon will continue to benefit from skilled Emily Cannon for flexiblity, postural strength and manual/modalities.   Rehab Potential Good   Emily Cannon Frequency 2x / week   Emily Cannon Duration 8 weeks   Emily Cannon Treatment/Interventions ADLs/Self Care Home Management;Electrical Stimulation;Moist Heat;Therapeutic exercise;Therapeutic activities;Ultrasound;Traction;Neuromuscular re-education;Patient/family education;Manual techniques;Dry needling   Emily Cannon Next Visit Plan assess response to DN 2, manual work, strength, flexibility. Review new HEP   Consulted and Agree with Plan of Care Patient      Patient will benefit from skilled therapeutic intervention in order to improve the following deficits and impairments:  Pain, Postural dysfunction, Decreased strength, Hypomobility, Impaired flexibility, Improper body mechanics  Visit Diagnosis: Cervicalgia  Cramp and spasm  Abnormal posture     Problem List Patient Active Problem List   Diagnosis Date Noted  . Unspecified venous (peripheral) insufficiency 11/23/2012  . Dermatitis 11/10/2012  . Hypothyroid 05/22/2011  . TYMPANIC MEMBRANE PERFORATION, LEFT EAR 08/08/2007  . OSTEOPENIA 07/27/2007  . Hyperlipidemia 05/11/2007  . Anxiety state 05/11/2007  . Essential hypertension 05/11/2007  . ALLERGIC RHINITIS 05/11/2007  . Irritable bowel  syndrome 05/11/2007  . LOW BACK PAIN 05/11/2007  . HYSTERECTOMY, HX OF 05/11/2007    Emily Cannon, Emily Cannon 12/28/2015 10:10 AM  [facility] [address], [place], [place], [address] Phone: [phone]   Fax:  [phone]  Name: Emily Cannon MRN: [id number] Date of Birth: 1933/07/27

## 2016-01-02 ENCOUNTER — Ambulatory Visit: Payer: Medicare Other

## 2016-01-02 DIAGNOSIS — R293 Abnormal posture: Secondary | ICD-10-CM | POA: Diagnosis not present

## 2016-01-02 DIAGNOSIS — M542 Cervicalgia: Secondary | ICD-10-CM

## 2016-01-02 DIAGNOSIS — R252 Cramp and spasm: Secondary | ICD-10-CM

## 2016-01-02 NOTE — Therapy (Addendum)
[facility] [address] [place], [place], [address] Phone: [phone]   Fax:  [phone]  Physical Therapy Treatment  Patient Details  Name: Emily Cannon MRN: [id number] Date of Birth: July 10, 1933 Referring Provider: Carolann Littler, MD  Encounter Date: 01/02/2016      PT End of Session - 01/02/16 1013    Visit Number 6   Number of Visits 10   Date for PT Re-Evaluation 02/06/16   PT Start Time 0930   PT Stop Time 1029   PT Time Calculation (min) 59 min   Activity Tolerance Patient tolerated treatment well   Behavior During Therapy [facility] for tasks assessed/performed      Past Medical History  Diagnosis Date  . Allergic rhinitis, cause unspecified   . Perforation of tympanic membrane, unspecified   . Unspecified essential hypertension   . Other and unspecified hyperlipidemia   . Irritable bowel syndrome   . Acquired absence of organ, genital organs   . Lumbago   . Disorder of bone and cartilage, unspecified   . Anxiety state, unspecified     Past Surgical History  Procedure Laterality Date  . Abdominal hysterectomy    . Cataract surgery  5/12 and 12/24/2012    both eyes    There were no vitals filed for this visit.      Subjective Assessment - 01/02/16 0934    Subjective Pt reports 50% overall improvement since the start of care.     Patient Stated Goals reduce neck pain, turn head with greater ease   Currently in Pain? Yes   Pain Score 4   up to 5/10 with turning head   Pain Location Neck   Pain Orientation Left   Pain Descriptors / Indicators Aching;Tightness;Sore   Pain Type Chronic pain   Pain Onset More than a month ago   Pain Frequency Intermittent   Aggravating Factors  turning head to the Lt   Pain Relieving Factors heat, stretching, e-stim            [facility] PT Assessment - 01/02/16 0001    AROM   Cervical - Right Rotation 35   Cervical - Left Rotation 30                      [facility] Adult PT Treatment/Exercise - 01/02/16 0001    Exercises   Exercises Shoulder   Neck Exercises: Machines for Strengthening   UBE (Upper Arm Bike) Level 1x 6 minutes (3/3)   Neck Exercises: Theraband   Shoulder Extension 20 reps;Red   Rows 20 reps;Red   Shoulder Exercises: Seated   Flexion Strengthening;Both;20 reps;Weights   Flexion Weight (lbs) 1   Abduction Strengthening;Both;20 reps;Weights   ABduction Weight (lbs) 1   ABduction Limitations scaption 1# 2x10   Modalities   Modalities Electrical Stimulation;Moist Heat   Moist Heat Therapy   Number Minutes Moist Heat 15 Minutes   Moist Heat Location Cervical   Electrical Stimulation   Electrical Stimulation Location neck and UT   Electrical Stimulation Action IFC   Electrical Stimulation Parameters 15 minutes   Electrical Stimulation Goals Pain   Manual Therapy   Manual Therapy Soft tissue mobilization;Joint mobilization;Myofascial release   Soft tissue mobilization Cervical LT in supine   Myofascial Release Cervical and UT and surrounding muscles          Trigger Point Dry Needling - 01/02/16 0944    Consent Given? Yes   Muscles Treated Upper  Body Upper trapezius;Oblique capitus  cervical paraspinals   Upper Trapezius Response Twitch reponse elicited;Palpable increased muscle length   Oblique Capitus Response Twitch response elicited;Palpable increased muscle length                PT Short Term Goals - 01/02/16 0935    PT SHORT TERM GOAL #1   Title be independent in initial HEP   Status Achieved   PT SHORT TERM GOAL #2   Title report a 25% reduction in turning head with driving   Status Achieved   PT SHORT TERM GOAL #3   Title demonstrate neutral seated posture and report corrections throughout the day   Time 4   Period Weeks   Status On-going           PT Long Term Goals - 12/12/15 [id number]    PT LONG TERM GOAL #1   Title independent with HEP and understand how  to progress herself   Time 8   Period Weeks   Status New   PT LONG TERM GOAL #2   Title reduce FOTO to < or = to 41% limitation   Time 8   Period Weeks   Status New   PT LONG TERM GOAL #3   Title report a 60% reduction in neck pain with turning head with driving   Time 8   Period Weeks   Status New   PT LONG TERM GOAL #4   Title report a 60% reduction in neck pain with ADLs and self-care   Time 8   Period Weeks   Status New               Plan - 01/02/16 [id number]    Clinical Impression Statement Pt reports 50% overall improvement in symptoms since the start of care. Pt with continued stiffness and trigger points in Lt neck and this has been improved since the start of care.  Pt with improved Lt rotation by 5 degrees since evaluation. Muscle with improved mobiltiy and reduced tension.  Pt with continued Lt neck pain (rated 5/10) with turning head to the Lt.  Pt also with postural abnormality and requires verbal cues for neutral seated posture.  Pt will continue to benefit from skilled PT for posutral strength, flexibility, manual and modaliteis to reduce pain.     Rehab Potential Good   PT Frequency 2x / week   PT Duration 8 weeks   PT Treatment/Interventions ADLs/Self Care Home Management;Electrical Stimulation;Moist Heat;Therapeutic exercise;Therapeutic activities;Ultrasound;Traction;Neuromuscular re-education;Patient/family education;Manual techniques;Dry needling   PT Next Visit Plan Assess response to DN 3, manual, postural strength, flexiblity.  Foam roll and scapular strength   Consulted and Agree with Plan of Care Patient      Patient will benefit from skilled therapeutic intervention in order to improve the following deficits and impairments:  Pain, Postural dysfunction, Decreased strength, Hypomobility, Impaired flexibility, Improper body mechanics  Visit Diagnosis: Cervicalgia  Cramp and spasm  Abnormal posture     Problem List Patient Active Problem List    Diagnosis Date Noted  . Unspecified venous (peripheral) insufficiency 11/23/2012  . Dermatitis 11/10/2012  . Hypothyroid 05/22/2011  . TYMPANIC MEMBRANE PERFORATION, LEFT EAR 08/08/2007  . OSTEOPENIA 07/27/2007  . Hyperlipidemia 05/11/2007  . Anxiety state 05/11/2007  . Essential hypertension 05/11/2007  . ALLERGIC RHINITIS 05/11/2007  . Irritable bowel syndrome 05/11/2007  . LOW BACK PAIN 05/11/2007  . HYSTERECTOMY, HX OF 05/11/2007     Sigurd Sos, PT 01/02/2016 1:53 PM  [facility] [address] [place], [place], [address] Phone: [phone]   Fax:  [phone]  Name: Emily Cannon MRN: [id number] Date of Birth: January 23, 1933

## 2016-01-04 ENCOUNTER — Encounter: Payer: Self-pay | Admitting: Physical Therapy

## 2016-01-04 ENCOUNTER — Ambulatory Visit: Payer: Medicare Other | Admitting: Physical Therapy

## 2016-01-04 DIAGNOSIS — M542 Cervicalgia: Secondary | ICD-10-CM

## 2016-01-04 DIAGNOSIS — R252 Cramp and spasm: Secondary | ICD-10-CM | POA: Diagnosis not present

## 2016-01-04 DIAGNOSIS — R293 Abnormal posture: Secondary | ICD-10-CM

## 2016-01-04 NOTE — Therapy (Signed)
[facility] [address] [place], [place], [address] Phone: [phone]   Fax:  [phone]  Physical Therapy Treatment  Patient Details  Name: Emily Cannon MRN: [id number] Date of Birth: [birth year] Referring Provider: Carolann Littler, MD  Encounter Date: 01/04/2016      PT End of Session - 01/04/16 0939    Visit Number 7   Number of Visits 10   Date for PT Re-Evaluation 02/06/16   PT Start Time 0929   PT Stop Time 1030   PT Time Calculation (min) 61 min   Activity Tolerance Patient tolerated treatment well   Behavior During Therapy [facility] for tasks assessed/performed      Past Medical History  Diagnosis Date  . Allergic rhinitis, cause unspecified   . Perforation of tympanic membrane, unspecified   . Unspecified essential hypertension   . Other and unspecified hyperlipidemia   . Irritable bowel syndrome   . Acquired absence of organ, genital organs   . Lumbago   . Disorder of bone and cartilage, unspecified   . Anxiety state, unspecified     Past Surgical History  Procedure Laterality Date  . Abdominal hysterectomy    . Cataract surgery  5/12 and 12/24/2012    both eyes    There were no vitals filed for this visit.      Subjective Assessment - 01/04/16 0937    Subjective Pt reports after DN she feels sore, but the day after she feeels better   Pertinent History Pt had PT at this clinic 05/2015 for neck pain   Diagnostic tests x-ray: OA of the cervical spine   Patient Stated Goals reduce neck pain, turn head with greater ease   Currently in Pain? No/denies  pain only with cervical rotation                         [facility] Adult PT Treatment/Exercise - 01/04/16 0001    Exercises   Exercises Shoulder   Neck Exercises: Machines for Strengthening   UBE (Upper Arm Bike) Level 1x 6 minutes (3/3), sitting on green physio ball   Neck Exercises: Theraband   Shoulder Extension 20  reps;Red   Rows 20 reps;Red   Shoulder Exercises: Seated   Flexion Strengthening;Both;20 reps;Weights   Flexion Weight (lbs) 1   Abduction Strengthening;Both;20 reps;Weights   ABduction Weight (lbs) 1   ABduction Limitations scaption 1# 2x10   Shoulder Exercises: Stretch   Corner Stretch 3 reps;20 seconds  with staggering stance x 3 each    Modalities   Modalities Electrical Stimulation;Moist Heat   Moist Heat Therapy   Number Minutes Moist Heat 15 Minutes   Moist Heat Location Cervical   Electrical Stimulation   Electrical Stimulation Location neck and UT   Electrical Stimulation Action IFC   Electrical Stimulation Parameters 15   Electrical Stimulation Goals Pain   Manual Therapy   Manual Therapy Soft tissue mobilization;Joint mobilization;Myofascial release   Soft tissue mobilization Cervical LT in supine   Myofascial Release Cervical and UT and surrounding muscles                  PT Short Term Goals - 01/02/16 0935    PT SHORT TERM GOAL #1   Title be independent in initial HEP   Status Achieved   PT SHORT TERM GOAL #2   Title report a 25% reduction in turning head with driving   Status Achieved   PT  SHORT TERM GOAL #3   Title demonstrate neutral seated posture and report corrections throughout the day   Time 4   Period Weeks   Status On-going           PT Long Term Goals - 12/12/15 [id number]    PT LONG TERM GOAL #1   Title independent with HEP and understand how to progress herself   Time 8   Period Weeks   Status New   PT LONG TERM GOAL #2   Title reduce FOTO to < or = to 41% limitation   Time 8   Period Weeks   Status New   PT LONG TERM GOAL #3   Title report a 60% reduction in neck pain with turning head with driving   Time 8   Period Weeks   Status New   PT LONG TERM GOAL #4   Title report a 60% reduction in neck pain with ADLs and self-care   Time 8   Period Weeks   Status New               Plan - 01/04/16 0940    Clinical  Impression Statement Pt with godd improvement in cervical ROM, but still pain with rotation Lt > Rt.  Pt with continued stiffness and trigger points in Lt neck, but it is improving. Pt will ocntinue to benefit from skilled PT for postural strength, flexibility, manual and modalities to reduce pain.    Rehab Potential Good   PT Frequency 2x / week   PT Duration 8 weeks   PT Treatment/Interventions ADLs/Self Care Home Management;Electrical Stimulation;Moist Heat;Therapeutic exercise;Therapeutic activities;Ultrasound;Traction;Neuromuscular re-education;Patient/family education;Manual techniques;Dry needling   PT Next Visit Plan Assess response to DN 3, manual, postural strength, flexiblity.  Foam roll and scapular strength   Consulted and Agree with Plan of Care Patient      Patient will benefit from skilled therapeutic intervention in order to improve the following deficits and impairments:  Pain, Postural dysfunction, Decreased strength, Hypomobility, Impaired flexibility, Improper body mechanics  Visit Diagnosis: Cervicalgia  Cramp and spasm  Abnormal posture     Problem List Patient Active Problem List   Diagnosis Date Noted  . Unspecified venous (peripheral) insufficiency 11/23/2012  . Dermatitis 11/10/2012  . Hypothyroid 05/22/2011  . TYMPANIC MEMBRANE PERFORATION, LEFT EAR 08/08/2007  . OSTEOPENIA 07/27/2007  . Hyperlipidemia 05/11/2007  . Anxiety state 05/11/2007  . Essential hypertension 05/11/2007  . ALLERGIC RHINITIS 05/11/2007  . Irritable bowel syndrome 05/11/2007  . LOW BACK PAIN 05/11/2007  . HYSTERECTOMY, HX OF 05/11/2007    NAUMANN-HOUEGNIFIO,Alleta Avery PTA 01/04/2016, 12:16 PM  [facility] [address], [place], [place], [address] Phone: [phone]   Fax:  [phone]  Name: Emily Cannon MRN: [id number] Date of Birth: May 20, 1933

## 2016-01-08 ENCOUNTER — Encounter: Payer: Self-pay | Admitting: Physical Therapy

## 2016-01-08 ENCOUNTER — Ambulatory Visit: Payer: Medicare Other | Admitting: Physical Therapy

## 2016-01-08 DIAGNOSIS — R293 Abnormal posture: Secondary | ICD-10-CM | POA: Diagnosis not present

## 2016-01-08 DIAGNOSIS — M542 Cervicalgia: Secondary | ICD-10-CM | POA: Diagnosis not present

## 2016-01-08 DIAGNOSIS — R252 Cramp and spasm: Secondary | ICD-10-CM

## 2016-01-08 NOTE — Therapy (Signed)
[facility] [address] [place], [place], [address] Phone: [phone]   Fax:  [phone]  Physical Therapy Treatment  Patient Details  Name: Emily Cannon MRN: [id number] Date of Birth: [birth year] Referring Provider: Carolann Littler, MD  Encounter Date: 01/08/2016      PT End of Session - 01/08/16 0934    Visit Number 8   Number of Visits 10   Date for PT Re-Evaluation 02/06/16   PT Start Time 0925   PT Stop Time 1025   PT Time Calculation (min) 60 min   Activity Tolerance Patient tolerated treatment well   Behavior During Therapy [facility] for tasks assessed/performed      Past Medical History  Diagnosis Date  . Allergic rhinitis, cause unspecified   . Perforation of tympanic membrane, unspecified   . Unspecified essential hypertension   . Other and unspecified hyperlipidemia   . Irritable bowel syndrome   . Acquired absence of organ, genital organs   . Lumbago   . Disorder of bone and cartilage, unspecified   . Anxiety state, unspecified     Past Surgical History  Procedure Laterality Date  . Abdominal hysterectomy    . Cataract surgery  5/12 and 12/24/2012    both eyes    There were no vitals filed for this visit.      Subjective Assessment - 01/08/16 0933    Subjective Yesterday I had no pain. Today I have no pain.    Currently in Pain? No/denies   Multiple Pain Sites No                         [facility] Adult PT Treatment/Exercise - 01/08/16 0001    Neck Exercises: Machines for Strengthening   UBE (Upper Arm Bike) Level 1x 6 minutes (3/3), sitting on green physio ball   Neck Exercises: Theraband   Shoulder Extension 20 reps;Red   Rows 20 reps;Red   Neck Exercises: Supine   Other Supine Exercise Cervical release with purple ball   Shoulder Exercises: Seated   Flexion Strengthening;Both;20 reps;Weights   Flexion Weight (lbs) 1   Abduction Strengthening;Both;20  reps;Weights   ABduction Weight (lbs) 1   ABduction Limitations scaption 1# 2x10   Shoulder Exercises: Stretch   Corner Stretch 3 reps;20 seconds  with staggering stance x 3 each    Moist Heat Therapy   Number Minutes Moist Heat 15 Minutes   Moist Heat Location Cervical   Electrical Stimulation   Electrical Stimulation Location neck and UT   Electrical Stimulation Action IFC   Electrical Stimulation Parameters 15   Electrical Stimulation Goals Pain   Manual Therapy   Manual Therapy Soft tissue mobilization;Joint mobilization;Myofascial release   Soft tissue mobilization Cervical LT in supine   Myofascial Release Cervical and UT and surrounding muscles                  PT Short Term Goals - 01/02/16 0935    PT SHORT TERM GOAL #1   Title be independent in initial HEP   Status Achieved   PT SHORT TERM GOAL #2   Title report a 25% reduction in turning head with driving   Status Achieved   PT SHORT TERM GOAL #3   Title demonstrate neutral seated posture and report corrections throughout the day   Time 4   Period Weeks   Status On-going           PT Long  Term Goals - 01/08/16 0935    PT LONG TERM GOAL #3   Title report a 60% reduction in neck pain with turning head with driving   Time 8   Period Weeks   Status Achieved  60%   PT LONG TERM GOAL #4   Title report a 60% reduction in neck pain with ADLs and self-care   Time 8   Period Weeks   Status Achieved  60%               Plan - 01/08/16 0935    Clinical Impression Statement Pt is 60% improved in regards to pain decreased and functional movements like turning her head when driving. This met two of her short term goals.    Rehab Potential Good   PT Frequency 2x / week   PT Duration 8 weeks   PT Treatment/Interventions ADLs/Self Care Home Management;Electrical Stimulation;Moist Heat;Therapeutic exercise;Therapeutic activities;Ultrasound;Traction;Neuromuscular re-education;Patient/family  education;Manual techniques;Dry needling   Consulted and Agree with Plan of Care Patient      Patient will benefit from skilled therapeutic intervention in order to improve the following deficits and impairments:  Pain, Postural dysfunction, Decreased strength, Hypomobility, Impaired flexibility, Improper body mechanics  Visit Diagnosis: Cervicalgia  Cramp and spasm  Abnormal posture     Problem List Patient Active Problem List   Diagnosis Date Noted  . Unspecified venous (peripheral) insufficiency 11/23/2012  . Dermatitis 11/10/2012  . Hypothyroid 05/22/2011  . TYMPANIC MEMBRANE PERFORATION, LEFT EAR 08/08/2007  . OSTEOPENIA 07/27/2007  . Hyperlipidemia 05/11/2007  . Anxiety state 05/11/2007  . Essential hypertension 05/11/2007  . ALLERGIC RHINITIS 05/11/2007  . Irritable bowel syndrome 05/11/2007  . LOW BACK PAIN 05/11/2007  . HYSTERECTOMY, HX OF 05/11/2007    Emily Cannon, PTA 01/08/2016, 10:07 AM  [facility] [address] [place], [place], [address] Phone: [phone]   Fax:  [phone]  Name: Emily Cannon MRN: [id number] Date of Birth: [birth year]

## 2016-01-16 ENCOUNTER — Encounter: Payer: Self-pay | Admitting: Physical Therapy

## 2016-01-16 ENCOUNTER — Ambulatory Visit: Payer: Medicare Other | Admitting: Physical Therapy

## 2016-01-16 DIAGNOSIS — R293 Abnormal posture: Secondary | ICD-10-CM

## 2016-01-16 DIAGNOSIS — R252 Cramp and spasm: Secondary | ICD-10-CM

## 2016-01-16 DIAGNOSIS — M542 Cervicalgia: Secondary | ICD-10-CM

## 2016-01-18 ENCOUNTER — Ambulatory Visit: Payer: Medicare Other

## 2016-01-18 DIAGNOSIS — R252 Cramp and spasm: Secondary | ICD-10-CM | POA: Diagnosis not present

## 2016-01-18 DIAGNOSIS — R293 Abnormal posture: Secondary | ICD-10-CM | POA: Diagnosis not present

## 2016-01-18 DIAGNOSIS — M542 Cervicalgia: Secondary | ICD-10-CM | POA: Diagnosis not present

## 2016-01-18 NOTE — Therapy (Signed)
[facility] [address], [place], [place], [address] Phone: [phone]   Fax:  [phone]  Physical Therapy Treatment  Patient Details  Name: Emily Cannon MRN: [id number] Date of Birth: 09-10-1932 Referring Provider: Carolann Littler, MD  Encounter Date: 01/18/2016      PT End of Session - 01/18/16 1309    Visit Number 10   Number of Visits 20   Date for PT Re-Evaluation 02/06/16   PT Start Time 1225   PT Stop Time 1320   PT Time Calculation (min) 55 min   Activity Tolerance Patient tolerated treatment well      Past Medical History  Diagnosis Date  . Allergic rhinitis, cause unspecified   . Perforation of tympanic membrane, unspecified   . Unspecified essential hypertension   . Other and unspecified hyperlipidemia   . Irritable bowel syndrome   . Acquired absence of organ, genital organs   . Lumbago   . Disorder of bone and cartilage, unspecified   . Anxiety state, unspecified     Past Surgical History  Procedure Laterality Date  . Abdominal hysterectomy    . Cataract surgery  5/12 and 12/24/2012    both eyes    There were no vitals filed for this visit.          [facility] PT Assessment - 01/18/16 0001    Assessment   Medical Diagnosis OA of cervical spine   Observation/Other Assessments   Focus on Therapeutic Outcomes (FOTO)  45% limitation                     [facility] Adult PT Treatment/Exercise - 01/18/16 0001    Neck Exercises: Machines for Strengthening   UBE (Upper Arm Bike) Level 2x 6 minutes (3/3), sitting on green physio ball   Neck Exercises: Theraband   Shoulder Extension 20 reps;Green   Rows 20 reps;Green   Shoulder Exercises: Seated   Flexion Strengthening;Both;20 reps;Weights   Flexion Weight (lbs) 1   Abduction Strengthening;Both;20 reps;Weights   ABduction Weight (lbs) 1   ABduction Limitations scaption 1# 2x10   Modalities   Modalities Electrical  Stimulation;Moist Heat   Moist Heat Therapy   Number Minutes Moist Heat 10 Minutes   Moist Heat Location Cervical   Electrical Stimulation   Electrical Stimulation Location neck and UT   Electrical Stimulation Action IFC   Electrical Stimulation Parameters 10 minutes   Electrical Stimulation Goals Pain   Manual Therapy   Manual Therapy Soft tissue mobilization;Joint mobilization;Myofascial release   Soft tissue mobilization Cervical paraspinals and UT   Myofascial Release Cervical and UT and surrounding muscles          Trigger Point Dry Needling - 01/18/16 1235    Consent Given? Yes   Muscles Treated Upper Body Oblique capitus;Upper trapezius  cervical paraspinals and multifidi   Upper Trapezius Response Twitch reponse elicited;Palpable increased muscle length   Oblique Capitus Response Twitch response elicited;Palpable increased muscle length                PT Short Term Goals - 01/18/16 1230    PT SHORT TERM GOAL #2   Title report a 25% reduction in turning head with driving   Status Achieved   PT SHORT TERM GOAL #3   Title demonstrate neutral seated posture and report corrections throughout the day   Status Achieved           PT Long Term Goals - 01/18/16 1231  PT LONG TERM GOAL #1   Title independent with HEP and understand how to progress herself   Time 8   Period Weeks   Status On-going   PT LONG TERM GOAL #2   Title reduce FOTO to < or = to 41% limitation   Time 8   Period Weeks   Status On-going   PT LONG TERM GOAL #3   Title report a 60% reduction in neck pain with turning head with driving   Time 8   Period Weeks   Status Achieved               Plan - Jan 31, 2016 1231    Clinical Impression Statement Pt reports 50-60% overall improvement in symptoms since the start of care.  Pt with trigger points in Lt neck>Rt and demonstrates improved tissue mobility and reduced tension after dry needling today.  Pt with improved cervical AROM  although it is still limited due to chronic AROM deficits.  Pt will continue to benefit from skilled PT for postural strength, flexiblity, modalities and manual as needed.     Rehab Potential Good   PT Frequency 2x / week   PT Duration 8 weeks   PT Treatment/Interventions ADLs/Self Care Home Management;Electrical Stimulation;Moist Heat;Therapeutic exercise;Therapeutic activities;Ultrasound;Traction;Neuromuscular re-education;Patient/family education;Manual techniques;Dry needling   PT Next Visit Plan Assess response to dry needling to neck. Continue manual, postural strength, flexibility, manual therapy   Consulted and Agree with Plan of Care Patient      Patient will benefit from skilled therapeutic intervention in order to improve the following deficits and impairments:  Pain, Postural dysfunction, Decreased strength, Hypomobility, Impaired flexibility, Improper body mechanics  Visit Diagnosis: Cervicalgia  Cramp and spasm  Abnormal posture       G-Codes - 2016-01-31 1247    Functional Assessment Tool Used FOTO: 45% limitaiton   Functional Limitation Other PT primary   Other PT Primary Current Status [id number]) At least 40 percent but less than 60 percent impaired, limited or restricted   Other PT Primary Goal Status [id number]) At least 40 percent but less than 60 percent impaired, limited or restricted      Problem List Patient Active Problem List   Diagnosis Date Noted  . Unspecified venous (peripheral) insufficiency 11/23/2012  . Dermatitis 11/10/2012  . Hypothyroid 05/22/2011  . TYMPANIC MEMBRANE PERFORATION, LEFT EAR 08/08/2007  . OSTEOPENIA 07/27/2007  . Hyperlipidemia 05/11/2007  . Anxiety state 05/11/2007  . Essential hypertension 05/11/2007  . ALLERGIC RHINITIS 05/11/2007  . Irritable bowel syndrome 05/11/2007  . LOW BACK PAIN 05/11/2007  . HYSTERECTOMY, HX OF 05/11/2007     Sigurd Sos, PT 01/31/2016 1:10 PM  [facility] [address] [place], [place], [address] Phone: [phone]   Fax:  [phone]  Name: Emily Cannon MRN: [id number] Date of Birth: [birth year]

## 2016-01-23 ENCOUNTER — Ambulatory Visit: Payer: Medicare Other | Admitting: Physical Therapy

## 2016-01-23 DIAGNOSIS — M542 Cervicalgia: Secondary | ICD-10-CM

## 2016-01-23 DIAGNOSIS — R252 Cramp and spasm: Secondary | ICD-10-CM | POA: Diagnosis not present

## 2016-01-23 DIAGNOSIS — R293 Abnormal posture: Secondary | ICD-10-CM

## 2016-01-23 NOTE — Therapy (Signed)
[facility] [address], [place], [place], [address] Phone: [phone]   Fax:  [phone]  Physical Therapy Treatment  Patient Details  Name: Emily Cannon MRN: [id number] Date of Birth: 1932-11-24 Referring Provider: Carolann Littler, MD  Encounter Date: 01/23/2016      PT End of Session - 01/23/16 1111    Visit Number 11   Number of Visits 20   Date for PT Re-Evaluation 02/06/16   PT Start Time 0725   PT Stop Time 0820   PT Time Calculation (min) 55 min   Activity Tolerance Patient tolerated treatment well      Past Medical History  Diagnosis Date  . Allergic rhinitis, cause unspecified   . Perforation of tympanic membrane, unspecified   . Unspecified essential hypertension   . Other and unspecified hyperlipidemia   . Irritable bowel syndrome   . Acquired absence of organ, genital organs   . Lumbago   . Disorder of bone and cartilage, unspecified   . Anxiety state, unspecified     Past Surgical History  Procedure Laterality Date  . Abdominal hysterectomy    . Cataract surgery  5/12 and 12/24/2012    both eyes    There were no vitals filed for this visit.      Subjective Assessment - 01/23/16 0726    Subjective My neck is stiff but not really painful.   I haven't had time to move around much this morning.   States the needling helps a lot.  E-stim/heat helps too.     Currently in Pain? No/denies   Pain Score 0-No pain   Pain Location Neck   Pain Orientation Left   Pain Type Chronic pain                         [facility] Adult PT Treatment/Exercise - 01/23/16 0001    Neck Exercises: Seated   Other Seated Exercise UBE 2 forward/2 backward   Shoulder Exercises: Seated   Extension Strengthening;20 reps;Theraband   Theraband Level (Shoulder Extension) Level 3 (Green)   Row Strengthening;Both;20 reps;Theraband   Theraband Level (Shoulder Row) Level 3 (Green)   Shoulder  Exercises: Standing   Other Standing Exercises wall push ups 15x   Other Standing Exercises against wall shoulder flexion and snowangels 5 x each   Moist Heat Therapy   Number Minutes Moist Heat 10 Minutes   Moist Heat Location Cervical   Electrical Stimulation   Electrical Stimulation Location neck and UT   Electrical Stimulation Action IFC   Electrical Stimulation Parameters 15   Electrical Stimulation Goals Pain   Manual Therapy   Manual Therapy Muscle Energy Technique;Manual Traction   Soft tissue mobilization Cervical paraspinals and UT   Manual Traction 5x 20 sec   Muscle Energy Technique contract relax upper traps          Trigger Point Dry Needling - 01/23/16 1110    Consent Given? Yes   Muscles Treated Upper Body Levator scapulae  Bilateral cervical multifidi   Upper Trapezius Response Twitch reponse elicited;Palpable increased muscle length   Levator Scapulae Response Twitch response elicited;Palpable increased muscle length                PT Short Term Goals - 01/23/16 1842    PT SHORT TERM GOAL #1   Title be independent in initial HEP   Status Achieved   PT SHORT TERM GOAL #2   Title report  a 25% reduction in turning head with driving   Status Achieved   PT SHORT TERM GOAL #3   Title demonstrate neutral seated posture and report corrections throughout the day   Status Achieved           PT Long Term Goals - 01/23/16 1842    PT LONG TERM GOAL #1   Title independent with HEP and understand how to progress herself   Time 8   Period Weeks   Status On-going   PT LONG TERM GOAL #2   Title reduce FOTO to < or = to 41% limitation   Time 8   Period Weeks   Status On-going   PT LONG TERM GOAL #3   Title report a 60% reduction in neck pain with turning head with driving   Status Achieved   PT LONG TERM GOAL #4   Title report a 60% reduction in neck pain with ADLs and self-care   Status Achieved               Plan - 01/23/16 1837     Clinical Impression Statement The patient continues to be highly motivated to improve her function and ROM.  She demonstrates good compliance with postural correction and HEP.  Tender points in upper trapezius muscles which limits her side bending and rotation ROM.  Improved muscle length following treatment session.  Therapist closely monitoring response with all interventions.     PT Next Visit Plan Assess response to dry needling #5 to neck. 1 more as needed; Continue manual, postural strength, flexibility, manual therapy;  e-stim/heat as needed      Patient will benefit from skilled therapeutic intervention in order to improve the following deficits and impairments:     Visit Diagnosis: Cervicalgia  Cramp and spasm  Abnormal posture     Problem List Patient Active Problem List   Diagnosis Date Noted  . Unspecified venous (peripheral) insufficiency 11/23/2012  . Dermatitis 11/10/2012  . Hypothyroid 05/22/2011  . TYMPANIC MEMBRANE PERFORATION, LEFT EAR 08/08/2007  . OSTEOPENIA 07/27/2007  . Hyperlipidemia 05/11/2007  . Anxiety state 05/11/2007  . Essential hypertension 05/11/2007  . ALLERGIC RHINITIS 05/11/2007  . Irritable bowel syndrome 05/11/2007  . LOW BACK PAIN 05/11/2007  . HYSTERECTOMY, HX OF 05/11/2007   Ruben Im, PT 01/23/2016 6:44 PM Phone: [phone] Fax: [phone]  Alvera Singh 01/23/2016, 6:43 PM  [facility] [address] [place], [place], [address] Phone: [phone]   Fax:  [phone]  Name: Emily Cannon MRN: [id number] Date of Birth: [birth year]

## 2016-01-25 ENCOUNTER — Ambulatory Visit: Payer: Medicare Other

## 2016-01-25 DIAGNOSIS — R252 Cramp and spasm: Secondary | ICD-10-CM

## 2016-01-25 DIAGNOSIS — M542 Cervicalgia: Secondary | ICD-10-CM | POA: Diagnosis not present

## 2016-01-25 DIAGNOSIS — R293 Abnormal posture: Secondary | ICD-10-CM

## 2016-01-25 NOTE — Patient Instructions (Addendum)
YOUR  [employer] Stand with back to wall and elbows/forearms at 90 degrees as shown. Bring hands toward each other as if doing a 'snow angel'. Repeat 10 Times Hold 3 Seconds Complete 1 Set   Wall posture with shoulder flexion Stand with back against the wall. shoulders back touching wall, chin in, core tight. While holding this position, slowly raise one arm. If you feel your upper back come off the wall, stop, and return to starting position. Go up as high as you can. Alternate arms. Repeat 10 Times Complete 2 Sets Perform 1 Time(s) a Day  [facility] [address] [place], [place] [address] Phone # [phone] Fax [phone]

## 2016-01-25 NOTE — Therapy (Signed)
[facility] [address], [place], [place], [address] Phone: [phone]   Fax:  [phone]  Physical Therapy Treatment  Patient Details  Name: Emily Cannon MRN: [id number] Date of Birth: 04/14/1933 Referring Provider: Carolann Littler, MD  Encounter Date: 01/25/2016      PT End of Session - 01/25/16 1443    Visit Number 12   Number of Visits 20   Date for PT Re-Evaluation 02/06/16   PT Start Time 1401   PT Stop Time 1458   PT Time Calculation (min) 57 min   Activity Tolerance Patient tolerated treatment well   Behavior During Therapy [facility] for tasks assessed/performed      Past Medical History  Diagnosis Date  . Allergic rhinitis, cause unspecified   . Perforation of tympanic membrane, unspecified   . Unspecified essential hypertension   . Other and unspecified hyperlipidemia   . Irritable bowel syndrome   . Acquired absence of organ, genital organs   . Lumbago   . Disorder of bone and cartilage, unspecified   . Anxiety state, unspecified     Past Surgical History  Procedure Laterality Date  . Abdominal hysterectomy    . Cataract surgery  5/12 and 12/24/2012    both eyes    There were no vitals filed for this visit.      Subjective Assessment - 01/25/16 1408    Subjective Pt reports that she is getting better.  Pt reports 50% overall improvement since the start of care.     Patient Stated Goals reduce neck pain, turn head with greater ease   Currently in Pain? No/denies                         [facility] Adult PT Treatment/Exercise - 01/25/16 0001    Neck Exercises: Machines for Strengthening   UBE (Upper Arm Bike) Level 2x 6 minutes (3/3)   Shoulder Exercises: Supine   Horizontal ABduction Strengthening;Both;20 reps;Theraband   Theraband Level (Shoulder Horizontal ABduction) Level 3 (Green)   Other Supine Exercises D2 with green band 2x10   Shoulder Exercises: Standing    Other Standing Exercises wall push ups 2x10   Other Standing Exercises against wall shoulder flexion and snowangels 2x10   Moist Heat Therapy   Number Minutes Moist Heat 15 Minutes   Moist Heat Location Cervical   Electrical Stimulation   Electrical Stimulation Location neck and UT   Electrical Stimulation Action IFC   Electrical Stimulation Parameters 15   Electrical Stimulation Goals Pain   Manual Therapy   Manual Therapy Muscle Energy Technique;Manual Traction   Soft tissue mobilization Cervical paraspinals and UT                PT Education - 01/25/16 1427    Education provided Yes   Education Details snow angel and shoulder flexion with back against the wall   Person(s) Educated Patient   Methods Explanation;Demonstration;Handout   Comprehension Verbalized understanding;Returned demonstration          PT Short Term Goals - 01/23/16 1842    PT SHORT TERM GOAL #1   Title be independent in initial HEP   Status Achieved   PT SHORT TERM GOAL #2   Title report a 25% reduction in turning head with driving   Status Achieved   PT SHORT TERM GOAL #3   Title demonstrate neutral seated posture and report corrections throughout the day   Status Achieved  PT Long Term Goals - 01/23/16 1842    PT LONG TERM GOAL #1   Title independent with HEP and understand how to progress herself   Time 8   Period Weeks   Status On-going   PT LONG TERM GOAL #2   Title reduce FOTO to < or = to 41% limitation   Time 8   Period Weeks   Status On-going   PT LONG TERM GOAL #3   Title report a 60% reduction in neck pain with turning head with driving   Status Achieved   PT LONG TERM GOAL #4   Title report a 60% reduction in neck pain with ADLs and self-care   Status Achieved               Plan - 01/25/16 1409    Clinical Impression Statement Pt reports 50% overall improvement since the start of care.  Pt is compliant with her HEP and is making postural  corrections at home consistently.  Pt with continued tension in upper trap and cervical paraspinals and limited cervcial AROM.  Pt with benefit from skilled PT for continued strength, flexiblity and manual/modalities as needed for pain.     Rehab Potential Good   PT Frequency 2x / week   PT Duration 8 weeks   PT Treatment/Interventions ADLs/Self Care Home Management;Electrical Stimulation;Moist Heat;Therapeutic exercise;Therapeutic activities;Ultrasound;Traction;Neuromuscular re-education;Patient/family education;Manual techniques;Dry needling   PT Next Visit Plan  1 more dry needling as needed; Continue manual, postural strength, flexibility, manual therapy;  e-stim/heat as needed   Consulted and Agree with Plan of Care Patient      Patient will benefit from skilled therapeutic intervention in order to improve the following deficits and impairments:  Pain, Postural dysfunction, Decreased strength, Hypomobility, Impaired flexibility, Improper body mechanics  Visit Diagnosis: Cervicalgia  Cramp and spasm  Abnormal posture     Problem List Patient Active Problem List   Diagnosis Date Noted  . Unspecified venous (peripheral) insufficiency 11/23/2012  . Dermatitis 11/10/2012  . Hypothyroid 05/22/2011  . TYMPANIC MEMBRANE PERFORATION, LEFT EAR 08/08/2007  . OSTEOPENIA 07/27/2007  . Hyperlipidemia 05/11/2007  . Anxiety state 05/11/2007  . Essential hypertension 05/11/2007  . ALLERGIC RHINITIS 05/11/2007  . Irritable bowel syndrome 05/11/2007  . LOW BACK PAIN 05/11/2007  . HYSTERECTOMY, HX OF 05/11/2007     Sigurd Sos, PT 01/25/2016 2:44 PM  [facility] [address], [place], [place], [address] Phone: [phone]   Fax:  [phone]  Name: Emily Cannon MRN: [id number] Date of Birth: 05/22/1933

## 2016-01-29 ENCOUNTER — Ambulatory Visit: Payer: Medicare Other | Attending: Family Medicine

## 2016-01-29 DIAGNOSIS — M542 Cervicalgia: Secondary | ICD-10-CM | POA: Diagnosis not present

## 2016-01-29 DIAGNOSIS — R252 Cramp and spasm: Secondary | ICD-10-CM | POA: Diagnosis not present

## 2016-01-29 DIAGNOSIS — R293 Abnormal posture: Secondary | ICD-10-CM

## 2016-01-29 NOTE — Therapy (Signed)
[facility] [address], [place], [place], [address] Phone: [phone]   Fax:  [phone]  Physical Therapy Treatment  Patient Details  Name: Emily Cannon MRN: [id number] Date of Birth: [birth year] Referring Provider: Carolann Littler, MD  Encounter Date: 01/29/2016      PT End of Session - 01/29/16 1225    Visit Number 13   Number of Visits 20   Date for PT Re-Evaluation 02/06/16   PT Start Time [id number]   PT Stop Time 1240   PT Time Calculation (min) 59 min   Activity Tolerance Patient tolerated treatment well   Behavior During Therapy [facility] for tasks assessed/performed      Past Medical History  Diagnosis Date  . Allergic rhinitis, cause unspecified   . Perforation of tympanic membrane, unspecified   . Unspecified essential hypertension   . Other and unspecified hyperlipidemia   . Irritable bowel syndrome   . Acquired absence of organ, genital organs   . Lumbago   . Disorder of bone and cartilage, unspecified   . Anxiety state, unspecified     Past Surgical History  Procedure Laterality Date  . Abdominal hysterectomy    . Cataract surgery  5/12 and 12/24/2012    both eyes    There were no vitals filed for this visit.      Subjective Assessment - 01/29/16 1143    Subjective I've been working on my exercises.     Currently in Pain? Yes   Pain Score 1    Pain Location Neck   Pain Orientation Left   Pain Descriptors / Indicators Pressure   Pain Type Chronic pain   Pain Onset More than a month ago   Aggravating Factors  turning head to the Lt   Pain Relieving Factors heat, stretching, e-stim                         [facility] Adult PT Treatment/Exercise - 01/29/16 0001    Neck Exercises: Machines for Strengthening   UBE (Upper Arm Bike) Level 2x 6 minutes (3/3)   Shoulder Exercises: Supine   Horizontal ABduction Strengthening;Both;20 reps;Theraband   Theraband Level (Shoulder  Horizontal ABduction) Level 3 (Green)   Other Supine Exercises D2 with green band 2x10   Shoulder Exercises: Seated   Flexion Strengthening;Both;20 reps;Weights   Flexion Weight (lbs) 1   Abduction Strengthening;Both;20 reps;Weights   ABduction Weight (lbs) 1   ABduction Limitations scaption 1# 2x10   Shoulder Exercises: Standing   Other Standing Exercises wall push ups 2x10   Other Standing Exercises against wall shoulder flexion and snowangels 2x10   Moist Heat Therapy   Number Minutes Moist Heat 15 Minutes   Moist Heat Location Cervical   Electrical Stimulation   Electrical Stimulation Location neck and UT   Electrical Stimulation Action IFC   Electrical Stimulation Parameters 15 minutes   Electrical Stimulation Goals Pain   Manual Therapy   Manual Therapy Soft tissue mobilization;Joint mobilization;Myofascial release   Soft tissue mobilization Cervical paraspinals and UT                  PT Short Term Goals - 01/23/16 1842    PT SHORT TERM GOAL #1   Title be independent in initial HEP   Status Achieved   PT SHORT TERM GOAL #2   Title report a 25% reduction in turning head with driving   Status Achieved   PT SHORT TERM  GOAL #3   Title demonstrate neutral seated posture and report corrections throughout the day   Status Achieved           PT Long Term Goals - 01/29/16 1146    PT LONG TERM GOAL #1   Title independent with HEP and understand how to progress herself   Time 8   Period Weeks   Status On-going   PT LONG TERM GOAL #2   Title reduce FOTO to < or = to 41% limitation   Time 8   Period Weeks   Status On-going   PT LONG TERM GOAL #3   Title report a 60% reduction in neck pain with turning head with driving   Status Achieved   PT LONG TERM GOAL #4   Title report a 60% reduction in neck pain with ADLs and self-care   Status Achieved               Plan - 01/29/16 1147    Clinical Impression Statement Pt reports 60% overall improvement  since the start of care. Pt reports 1/10 Lt sided neck pain and describes as pressure with turning head to the Lt.  Pt is compliant with her HEP for strength and flexiblity.  Pt with tension in Lt upper trap and neck and this is reduced from evaluation . Pt will benefit from skilled PT for continued strength, flexibility, and manual/modalities as needed for pain.     Rehab Potential Good   PT Frequency 2x / week   PT Duration 8 weeks   PT Treatment/Interventions ADLs/Self Care Home Management;Electrical Stimulation;Moist Heat;Therapeutic exercise;Therapeutic activities;Ultrasound;Traction;Neuromuscular re-education;Patient/family education;Manual techniques;Dry needling   PT Next Visit Plan  1 more dry needling as needed; Continue manual, postural strength, flexibility, manual therapy;  e-stim/heat as needed   Consulted and Agree with Plan of Care Patient      Patient will benefit from skilled therapeutic intervention in order to improve the following deficits and impairments:  Pain, Postural dysfunction, Decreased strength, Hypomobility, Impaired flexibility, Improper body mechanics  Visit Diagnosis: Cervicalgia  Cramp and spasm  Abnormal posture     Problem List Patient Active Problem List   Diagnosis Date Noted  . Unspecified venous (peripheral) insufficiency 11/23/2012  . Dermatitis 11/10/2012  . Hypothyroid 05/22/2011  . TYMPANIC MEMBRANE PERFORATION, LEFT EAR 08/08/2007  . OSTEOPENIA 07/27/2007  . Hyperlipidemia 05/11/2007  . Anxiety state 05/11/2007  . Essential hypertension 05/11/2007  . ALLERGIC RHINITIS 05/11/2007  . Irritable bowel syndrome 05/11/2007  . LOW BACK PAIN 05/11/2007  . HYSTERECTOMY, HX OF 05/11/2007     Sigurd Sos, PT 01/29/2016 12:26 PM   [facility] [address] [place], [place], [address] Phone: [phone]   Fax:  [phone]  Name: Emily Cannon MRN: [id number] Date of  Birth: Jun 21, 1933

## 2016-02-01 ENCOUNTER — Ambulatory Visit: Payer: Medicare Other

## 2016-02-01 DIAGNOSIS — R293 Abnormal posture: Secondary | ICD-10-CM

## 2016-02-01 DIAGNOSIS — R252 Cramp and spasm: Secondary | ICD-10-CM

## 2016-02-01 DIAGNOSIS — M542 Cervicalgia: Secondary | ICD-10-CM | POA: Diagnosis not present

## 2016-02-01 NOTE — Therapy (Signed)
[facility] [address] [place], [place], [address] Phone: [phone]   Fax:  [phone]  Physical Therapy Treatment  Patient Details  Name: Emily Cannon MRN: [id number] Date of Birth: 01/27/1933 Referring Provider: Carolann Littler, MD  Encounter Date: 02/01/2016      PT End of Session - 02/01/16 1009    Visit Number 14   Number of Visits 20   Date for PT Re-Evaluation 02/06/16   PT Start Time 0930   PT Stop Time 1026   PT Time Calculation (min) 56 min   Activity Tolerance Patient tolerated treatment well   Behavior During Therapy [facility] for tasks assessed/performed      Past Medical History  Diagnosis Date  . Allergic rhinitis, cause unspecified   . Perforation of tympanic membrane, unspecified   . Unspecified essential hypertension   . Other and unspecified hyperlipidemia   . Irritable bowel syndrome   . Acquired absence of organ, genital organs   . Lumbago   . Disorder of bone and cartilage, unspecified   . Anxiety state, unspecified     Past Surgical History  Procedure Laterality Date  . Abdominal hysterectomy    . Cataract surgery  5/12 and 12/24/2012    both eyes    There were no vitals filed for this visit.      Subjective Assessment - 02/01/16 0941    Subjective Neck is feeling pretty good.     Currently in Pain? Yes   Pain Score 2    Pain Location Neck   Pain Orientation Left   Pain Descriptors / Indicators Pressure   Pain Type Chronic pain   Pain Onset More than a month ago   Pain Frequency Intermittent   Aggravating Factors  turning head to the Lt   Pain Relieving Factors heat, stretching, e-stim                         [facility] Adult PT Treatment/Exercise - 02/01/16 0001    Neck Exercises: Machines for Strengthening   UBE (Upper Arm Bike) Level 2x 8 minutes (4/4)   Shoulder Exercises: Supine   Horizontal ABduction Strengthening;Both;20 reps;Theraband   standing against the wall   Theraband Level (Shoulder Horizontal ABduction) Level 1 (Yellow)   Other Supine Exercises D2 with yellow band 2x10 against the wall   Shoulder Exercises: Seated   Flexion Strengthening;Both;20 reps;Weights   Flexion Weight (lbs) 1   Abduction Strengthening;Both;20 reps;Weights   ABduction Weight (lbs) 1   ABduction Limitations scaption 1# 2x10   Shoulder Exercises: Standing   Other Standing Exercises wall push ups 2x10   Other Standing Exercises against wall shoulder flexion and snowangels 2x10   Moist Heat Therapy   Number Minutes Moist Heat 15 Minutes   Moist Heat Location Cervical   Electrical Stimulation   Electrical Stimulation Location neck and UT   Electrical Stimulation Action IFC   Electrical Stimulation Parameters 15 minutes   Electrical Stimulation Goals Pain   Manual Therapy   Manual Therapy Soft tissue mobilization;Joint mobilization;Myofascial release   Soft tissue mobilization Cervical paraspinals and UT                  PT Short Term Goals - 01/23/16 1842    PT SHORT TERM GOAL #1   Title be independent in initial HEP   Status Achieved   PT SHORT TERM GOAL #2   Title report a 25% reduction in turning  head with driving   Status Achieved   PT SHORT TERM GOAL #3   Title demonstrate neutral seated posture and report corrections throughout the day   Status Achieved           PT Long Term Goals - 01/29/16 1146    PT LONG TERM GOAL #1   Title independent with HEP and understand how to progress herself   Time 8   Period Weeks   Status On-going   PT LONG TERM GOAL #2   Title reduce FOTO to < or = to 41% limitation   Time 8   Period Weeks   Status On-going   PT LONG TERM GOAL #3   Title report a 60% reduction in neck pain with turning head with driving   Status Achieved   PT LONG TERM GOAL #4   Title report a 60% reduction in neck pain with ADLs and self-care   Status Achieved               Plan -  02/01/16 0941    Clinical Impression Statement Pt reports 65% overall improvement since the start of care.  Pt reports 2/10 LT neck pain today.  Pt is compliant with her HEP for strength and flexiblity.  Pt performed theraband exercise in standing today without difficulty.  Pt with tension in Lt UT and neck that is reduced since evaluation.  Pt will benefit from 1 more PT session for final HEP and manual/modalities.     Rehab Potential Good   PT Frequency 2x / week   PT Duration 8 weeks   PT Treatment/Interventions ADLs/Self Care Home Management;Electrical Stimulation;Moist Heat;Therapeutic exercise;Therapeutic activities;Ultrasound;Traction;Neuromuscular re-education;Patient/family education;Manual techniques;Dry needling   PT Next Visit Plan  1 more dry needling as needed; Continue manual, postural strength, flexibility, manual therapy;  e-stim/heat as needed.  D/C PT next session to HEP   Consulted and Agree with Plan of Care Patient      Patient will benefit from skilled therapeutic intervention in order to improve the following deficits and impairments:  Pain, Postural dysfunction, Decreased strength, Hypomobility, Impaired flexibility, Improper body mechanics  Visit Diagnosis: Cervicalgia  Cramp and spasm  Abnormal posture     Problem List Patient Active Problem List   Diagnosis Date Noted  . Unspecified venous (peripheral) insufficiency 11/23/2012  . Dermatitis 11/10/2012  . Hypothyroid 05/22/2011  . TYMPANIC MEMBRANE PERFORATION, LEFT EAR 08/08/2007  . OSTEOPENIA 07/27/2007  . Hyperlipidemia 05/11/2007  . Anxiety state 05/11/2007  . Essential hypertension 05/11/2007  . ALLERGIC RHINITIS 05/11/2007  . Irritable bowel syndrome 05/11/2007  . LOW BACK PAIN 05/11/2007  . HYSTERECTOMY, HX OF 05/11/2007     Sigurd Sos, PT 02/01/2016 10:11 AM  [facility] [address] [place], [place], [address] Phone:  [phone]   Fax:  [phone]  Name: [place] MRN: [id number] Date of Birth: 09-01-1932

## 2016-02-06 ENCOUNTER — Ambulatory Visit: Payer: Medicare Other | Admitting: Physical Therapy

## 2016-02-06 DIAGNOSIS — R252 Cramp and spasm: Secondary | ICD-10-CM

## 2016-02-06 DIAGNOSIS — M542 Cervicalgia: Secondary | ICD-10-CM | POA: Diagnosis not present

## 2016-02-06 DIAGNOSIS — R293 Abnormal posture: Secondary | ICD-10-CM

## 2016-02-06 NOTE — Therapy (Signed)
[facility] [address] [place], [place], [address] Phone: [phone]   Fax:  [phone]  Physical Therapy Treatment/Discharge Summary  Patient Details  Name: [place] Cannon MRN: [id number] Date of Birth: January 06, 1933 Referring Provider: Carolann Littler, MD  Encounter Date: 02/06/2016      PT End of Session - 02/06/16 0745    Visit Number 15   Number of Visits 20   Date for PT Re-Evaluation 02/06/16   PT Start Time 0725   PT Stop Time 0805   PT Time Calculation (min) 40 min   Activity Tolerance Patient tolerated treatment well      Past Medical History  Diagnosis Date  . Allergic rhinitis, cause unspecified   . Perforation of tympanic membrane, unspecified   . Unspecified essential hypertension   . Other and unspecified hyperlipidemia   . Irritable bowel syndrome   . Acquired absence of organ, genital organs   . Lumbago   . Disorder of bone and cartilage, unspecified   . Anxiety state, unspecified     Past Surgical History  Procedure Laterality Date  . Abdominal hysterectomy    . Cataract surgery  5/12 and 12/24/2012    both eyes    There were no vitals filed for this visit.      Subjective Assessment - 02/06/16 0726    Subjective "I'm so much better than when I started here."     Currently in Pain? Yes   Pain Score 2    Pain Location Neck   Pain Orientation Left   Aggravating Factors  turning head            [facility] PT Assessment - 02/06/16 0001    Observation/Other Assessments   Focus on Therapeutic Outcomes (FOTO)  37% limitation   AROM   AROM Assessment Site Cervical   Cervical Flexion 45   Cervical Extension 42   Cervical - Right Side Bend 30   Cervical - Left Side Bend 20   Cervical - Right Rotation 32   Cervical - Left Rotation 28                     [facility] Adult PT Treatment/Exercise - 02/06/16 0001    Neck Exercises: Standing   Other Standing Exercises  yellow band D2 20x yellow band   Shoulder Exercises: Seated   Flexion Strengthening;Both;20 reps;Weights   Flexion Weight (lbs) 1   Abduction Strengthening;Both;20 reps;Weights   ABduction Weight (lbs) 1   ABduction Limitations scaption 1# 2x10   Shoulder Exercises: Standing   Horizontal ABduction Strengthening;Both;20 reps;Theraband   Theraband Level (Shoulder Horizontal ABduction) Level 1 (Yellow)   Other Standing Exercises wall push ups 2x10   Other Standing Exercises against wall shoulder flexion and snowangels 2x10   Moist Heat Therapy   Number Minutes Moist Heat 15 Minutes   Moist Heat Location Cervical   Electrical Stimulation   Electrical Stimulation Location neck and UT   Electrical Stimulation Action IFC   Electrical Stimulation Parameters 15 min   Electrical Stimulation Goals Pain                  PT Short Term Goals - 02/06/16 0734    PT SHORT TERM GOAL #1   Title be independent in initial HEP   Status Achieved   PT SHORT TERM GOAL #2   Title report a 25% reduction in turning head with driving   Status Achieved   PT SHORT TERM  GOAL #3   Title demonstrate neutral seated posture and report corrections throughout the day   Status Achieved           PT Long Term Goals - Feb 13, 2016 0734    PT LONG TERM GOAL #1   Title independent with HEP and understand how to progress herself   Status Achieved   PT LONG TERM GOAL #2   Title reduce FOTO to < or = to 41% limitation   Status Achieved   PT LONG TERM GOAL #3   Title report a 60% reduction in neck pain with turning head with driving   Status Achieved   PT LONG TERM GOAL #4   Title report a 60% reduction in neck pain with ADLs and self-care   Status Achieved               Plan - 02/13/16 0746    Clinical Impression Statement The patient reports a signficant improvement of 75% since starting PT.  She is very pleased with her progress and demonstrates good compliance with her home ex program.    She also demonstrates good postural awareness in both sitting and standing.  Her cervical AROM is limited with rotation and sidebending most likely secondary to arthritic changes.  All rehab goals were met.        Patient will benefit from skilled therapeutic intervention in order to improve the following deficits and impairments:     Visit Diagnosis: Cervicalgia  Cramp and spasm  Abnormal posture       G-Codes - 02/13/2016 0754    Functional Assessment Tool Used FOTO   Functional Limitation Other PT primary   Other PT Primary Current Status (J3354) At least 20 percent but less than 40 percent impaired, limited or restricted   Other PT Primary Goal Status (T6256) At least 40 percent but less than 60 percent impaired, limited or restricted   Other PT Primary Discharge Status [id number]) At least 20 percent but less than 40 percent impaired, limited or restricted     PHYSICAL THERAPY DISCHARGE SUMMARY  Visits from Start of Care: 15  Current functional level related to goals / functional outcomes: See clinical impressions above   Remaining deficits: As above   Education / Equipment: HEP progression Plan: Patient agrees to discharge.  Patient goals were met. Patient is being discharged due to meeting the stated rehab goals.  ?????        Problem List Patient Active Problem List   Diagnosis Date Noted  . Unspecified venous (peripheral) insufficiency 11/23/2012  . Dermatitis 11/10/2012  . Hypothyroid 05/22/2011  . TYMPANIC MEMBRANE PERFORATION, LEFT EAR 08/08/2007  . OSTEOPENIA 07/27/2007  . Hyperlipidemia 05/11/2007  . Anxiety state 05/11/2007  . Essential hypertension 05/11/2007  . ALLERGIC RHINITIS 05/11/2007  . Irritable bowel syndrome 05/11/2007  . LOW BACK PAIN 05/11/2007  . HYSTERECTOMY, HX OF 05/11/2007   Ruben Im, PT 02-13-2016 7:57 AM Phone: [phone] Fax: [phone]  Alvera Singh Feb 13, 2016, 7:56 AM  [facility] [address], [place], [place], [address] Phone: [phone]   Fax:  [phone]  Name: MELBA ARAKI MRN: [id number] Date of Birth: March 05, 1933

## 2016-02-15 ENCOUNTER — Other Ambulatory Visit: Payer: Self-pay | Admitting: Family Medicine

## 2016-02-29 ENCOUNTER — Other Ambulatory Visit: Payer: Self-pay | Admitting: Family Medicine

## 2016-03-05 ENCOUNTER — Other Ambulatory Visit: Payer: Self-pay | Admitting: Family Medicine

## 2016-03-06 ENCOUNTER — Ambulatory Visit (INDEPENDENT_AMBULATORY_CARE_PROVIDER_SITE_OTHER): Payer: Medicare Other | Admitting: Family Medicine

## 2016-03-06 VITALS — BP 160/80 | HR 87 | Temp 98.5°F | Ht 60.5 in | Wt 130.0 lb

## 2016-03-06 DIAGNOSIS — M778 Other enthesopathies, not elsewhere classified: Secondary | ICD-10-CM | POA: Diagnosis not present

## 2016-03-06 MED ORDER — DICLOFENAC SODIUM 1 % TD GEL: 2.0000 g | Freq: Four times a day (QID) | TRANSDERMAL | 1 refills | Status: DC

## 2016-03-06 NOTE — Progress Notes (Signed)
Subjective:     Patient ID: Emily Cannon, female   DOB: 05/08/1933, 82 y.o.   MRN: [id number]  HPI Acute visit for right wrist pain. 3 weeks ago she was helping her husband pull on elastic compression when she felt some pain near the base of her thumb and along the radial aspect of the wrist. No visible swelling. She's tried some ice without much relief. Also tried over-the-counter Aspercreme. She has pain with writing and daily activities. No bruising. No weakness.  Past Medical History:  Diagnosis Date  . Acquired absence of organ, genital organs   . Allergic rhinitis, cause unspecified   . Anxiety state, unspecified   . Disorder of bone and cartilage, unspecified   . Irritable bowel syndrome   . Lumbago   . Other and unspecified hyperlipidemia   . Perforation of tympanic membrane, unspecified   . Unspecified essential hypertension    Past Surgical History:  Procedure Laterality Date  . ABDOMINAL HYSTERECTOMY    . cataract surgery  5/12 and 12/24/2012   both eyes    reports that she has never smoked. She has never used smokeless tobacco. She reports that she does not drink alcohol or use drugs. family history includes COPD in her other; Lung cancer (age of onset: 47) in her mother; Lung disease (age of onset: 59) in her father. Allergies  Allergen Reactions  . Alendronate Sodium     REACTION: pt states "short of breath"  . Amoxicillin     REACTION: causes severe diarrhea  . Codeine     REACTION: pt states nausea  . Valsartan     REACTION: gums/throat swelling     Review of Systems  Neurological: Negative for weakness and numbness.       Objective:   Physical Exam  Constitutional: She appears well-developed and well-nourished.  Cardiovascular: Normal rate and regular rhythm.   Pulmonary/Chest: Effort normal and breath sounds normal. No respiratory distress. She has no wheezes. She has no rales.  Musculoskeletal:  Right wrist no edema. No ecchymosis. She has some  tenderness along the abductor and extensor tendon of the right thumb. No bony tenderness. Full range of motion right wrist. No warmth or erythema       Assessment:     Right wrist pain. Suspect tendinitis-de Quervain's    Plan:     -Try icing 2-3 times daily -Consider over-the-counter wrist brace for the next couple weeks -Try diclofenac gel 3-4 times daily -Touch base in 2-3 weeks if no improvement. May need to consider steroid injection at that point if not improved  Eulas Post MD [facility] at [facility]

## 2016-03-06 NOTE — Progress Notes (Signed)
Pre visit review using our clinic review tool, if applicable. No additional management support is needed unless otherwise documented below in the visit note. 

## 2016-03-06 NOTE — Patient Instructions (Addendum)
Emily Cannon Disease Emily Cannon disease is inflammation of the tendon on the thumb side of the wrist. Tendons are cords of tissue that connect bones to muscles. The tendons in your hand pass through a tunnel, or sheath. A slippery layer of tissue (synovium) lets the tendons move smoothly in the sheath. With de Quervain disease, the sheath swells or thickens, causing friction and pain. The condition is also called de Quervain tendinosis and de Quervain syndrome. It occurs most often in women who are 83-67 years old. CAUSES  The exact cause of de Quervain disease is not known. It may result from:   Overusing your hands, especially with repetitive motions that involve twisting your hand or using a forceful grip.  Pregnancy.  Rheumatoid disease. RISK FACTORS You may have a greater risk for de Quervain disease if you:  Are a middle-aged woman.  Are pregnant.  Have rheumatoid arthritis.  Have diabetes.  Use your hands far more than normal, especially with a tight grip or excessive twisting. SIGNS AND SYMPTOMS Pain on the thumb side of your wrist is the main symptom of de Quervain disease. Other signs and symptoms include:  Pain that gets worse when you grasp something or turn your wrist.  Pain that extends up the forearm.  Cysts in the area of the pain.  Swelling of your wrist and hand.  A sensation of snapping in the wrist.  Trouble moving the thumb and wrist. DIAGNOSIS  Your health care provider may diagnose de Quervain disease based on your signs and symptoms. A physical exam will also be done. A simple test Wynn Maudlin test) that involves pulling your thumb and wrist to see if this causes pain can help determine whether you have the condition. Sometimes you may need to have an X-ray.  TREATMENT  Avoiding any activity that causes pain and swelling is the best treatment. Other options include:  Wearing a splint.  Taking medicine. Anti-inflammatory medicines and corticosteroid  injections may reduce inflammation and relieve pain.  Having surgery if other treatments do not work. HOME CARE INSTRUCTIONS   Using ice can be helpful after doing activities that involve the sore wrist. To apply ice to the injured area:  Put ice in a plastic bag.  Place a towel between your skin and the bag.  Leave the ice on for 20 minutes, 2-3 times a day.  Take medicines only as directed by your health care provider.  Wear your splint as directed. This will allow your hand to rest and heal. SEEK MEDICAL CARE IF:   Your pain medicine does not help.   Your pain gets worse.  You develop new symptoms. MAKE SURE YOU:   Understand these instructions.  Will watch your condition.  Will get help right away if you are not doing well or get worse.   This information is not intended to replace advice given to you by your health care provider. Make sure you discuss any questions you have with your health care provider.   Document Released: 04/09/2001 Document Revised: 08/05/2014 Document Reviewed: 11/17/2013 Elsevier Interactive Patient Education 2016 [employer].  Consider short arm wrist brace Continue with icing twice daily Try the Diclofenac gel 3-4 times daily. Follow up in 2-3 weeks if no better.

## 2016-03-08 ENCOUNTER — Other Ambulatory Visit: Payer: Self-pay | Admitting: *Deleted

## 2016-03-08 NOTE — Telephone Encounter (Signed)
Last refill given on 12/13/2015-#90 with 1 refill

## 2016-03-10 NOTE — Telephone Encounter (Signed)
Refill with one additional refill. 

## 2016-03-11 MED ORDER — ALPRAZOLAM 0.25 MG PO TABS: 0.2500 mg | ORAL_TABLET | Freq: Two times a day (BID) | ORAL | 1 refills | Status: DC | PRN

## 2016-03-13 DIAGNOSIS — H61303 Acquired stenosis of external ear canal, unspecified, bilateral: Secondary | ICD-10-CM | POA: Diagnosis not present

## 2016-03-13 DIAGNOSIS — H6123 Impacted cerumen, bilateral: Secondary | ICD-10-CM | POA: Diagnosis not present

## 2016-03-13 DIAGNOSIS — H903 Sensorineural hearing loss, bilateral: Secondary | ICD-10-CM | POA: Diagnosis not present

## 2016-03-25 ENCOUNTER — Ambulatory Visit (INDEPENDENT_AMBULATORY_CARE_PROVIDER_SITE_OTHER): Payer: Medicare Other | Admitting: Family Medicine

## 2016-03-25 DIAGNOSIS — M6588 Other synovitis and tenosynovitis, other site: Secondary | ICD-10-CM | POA: Diagnosis not present

## 2016-03-25 DIAGNOSIS — M778 Other enthesopathies, not elsewhere classified: Secondary | ICD-10-CM

## 2016-03-25 DIAGNOSIS — M779 Enthesopathy, unspecified: Principal | ICD-10-CM

## 2016-03-25 MED ORDER — METHYLPREDNISOLONE ACETATE 40 MG/ML IJ SUSP
40.0000 mg | Freq: Once | INTRAMUSCULAR | Status: AC
  Administered 2016-03-25: 40 mg via INTRA_ARTICULAR

## 2016-03-25 NOTE — Progress Notes (Signed)
Subjective:     Patient ID: Emily Cannon, female   DOB: 1933/05/29, 82 y.o.   MRN: [id number]  HPI Patient here for follow-up right wrist pain. Refer note. Suspected de Quervain's tenosynovitis. Patient has been icing regularly along with topical diclofenac and wrist brace without improvement. It appears however that her wrist brace was not a thumb spica splint No injury. Husband had recent knee replacement surgery and she had been struggling to get compression stockings on him and states this might have exacerbated her pain. No weakness.  Past Medical History:  Diagnosis Date  . Acquired absence of organ, genital organs   . Allergic rhinitis, cause unspecified   . Anxiety state, unspecified   . Disorder of bone and cartilage, unspecified   . Irritable bowel syndrome   . Lumbago   . Other and unspecified hyperlipidemia   . Perforation of tympanic membrane, unspecified   . Unspecified essential hypertension    Past Surgical History:  Procedure Laterality Date  . ABDOMINAL HYSTERECTOMY    . cataract surgery  5/12 and 12/24/2012   both eyes    reports that she has never smoked. She has never used smokeless tobacco. She reports that she does not drink alcohol or use drugs. family history includes COPD in her other; Lung cancer (age of onset: 51) in her mother; Lung disease (age of onset: 46) in her father. Allergies  Allergen Reactions  . Alendronate Sodium     REACTION: pt states "short of breath"  . Amoxicillin     REACTION: causes severe diarrhea  . Codeine     REACTION: pt states nausea  . Valsartan     REACTION: gums/throat swelling     Review of Systems  Musculoskeletal: Negative for neck pain.  Neurological: Negative for weakness and numbness.       Objective:   Physical Exam  Constitutional: She appears well-developed and well-nourished.  Cardiovascular: Normal rate and regular rhythm.   Pulmonary/Chest: Effort normal and breath sounds normal. No respiratory  distress. She has no wheezes. She has no rales.  Musculoskeletal:  Right wrist no edema. She has tenderness over the abductor tendon of the right thumb She does not have any tenderness over the joints of the thumb. No radial tenderness. Normal radial and ulnar pulses       Assessment:     Tendinitis right thumb not improved with icing, topical diclofenac, and bracing    Plan:     -Discussed risk and benefits of steroid injection right thumb tendon sheath with patient. We discussed risk of bleeding, bruising, infection and patient consented. Using 1/2 mL of Depo-Medrol 25-gauge 5/8 needle injected without difficulty. Patient will continue icing for the next few days. Touch base in 2 weeks if not improved  Eulas Post MD [facility] at [facility]

## 2016-03-25 NOTE — Patient Instructions (Signed)
De Quervain Disease De Quervain disease is inflammation of the tendon on the thumb side of the wrist. Tendons are cords of tissue that connect bones to muscles. The tendons in your hand pass through a tunnel, or sheath. A slippery layer of tissue (synovium) lets the tendons move smoothly in the sheath. With de Quervain disease, the sheath swells or thickens, causing friction and pain. The condition is also called de Quervain tendinosis and de Quervain syndrome. It occurs most often in women who are 80 years old. CAUSES  The exact cause of de Quervain disease is not known. It may result from:   Overusing your hands, especially with repetitive motions that involve twisting your hand or using a forceful grip.  Pregnancy.  Rheumatoid disease. RISK FACTORS You may have a greater risk for de Quervain disease if you:  Are a middle-aged woman.  Are pregnant.  Have rheumatoid arthritis.  Have diabetes.  Use your hands far more than normal, especially with a tight grip or excessive twisting. SIGNS AND SYMPTOMS Pain on the thumb side of your wrist is the main symptom of de Quervain disease. Other signs and symptoms include:  Pain that gets worse when you grasp something or turn your wrist.  Pain that extends up the forearm.  Cysts in the area of the pain.  Swelling of your wrist and hand.  A sensation of snapping in the wrist.  Trouble moving the thumb and wrist. DIAGNOSIS  Your health care provider may diagnose de Quervain disease based on your signs and symptoms. A physical exam will also be done. A simple test (Finkelstein test) that involves pulling your thumb and wrist to see if this causes pain can help determine whether you have the condition. Sometimes you may need to have an X-ray.  TREATMENT  Avoiding any activity that causes pain and swelling is the best treatment. Other options include:  Wearing a splint.  Taking medicine. Anti-inflammatory medicines and corticosteroid  injections may reduce inflammation and relieve pain.  Having surgery if other treatments do not work. HOME CARE INSTRUCTIONS   Using ice can be helpful after doing activities that involve the sore wrist. To apply ice to the injured area:  Put ice in a plastic bag.  Place a towel between your skin and the bag.  Leave the ice on for 20 minutes, 2-3 times a day.  Take medicines only as directed by your health care provider.  Wear your splint as directed. This will allow your hand to rest and heal. SEEK MEDICAL CARE IF:   Your pain medicine does not help.   Your pain gets worse.  You develop new symptoms. MAKE SURE YOU:   Understand these instructions.  Will watch your condition.  Will get help right away if you are not doing well or get worse.   This information is not intended to replace advice given to you by your health care provider. Make sure you discuss any questions you have with your health care provider.   Document Released: 04/09/2001 Document Revised: 08/05/2014 Document Reviewed: 11/17/2013 Elsevier Interactive Patient Education 2016 Elsevier Inc.  

## 2016-03-25 NOTE — Progress Notes (Signed)
Pre visit review using our clinic review tool, if applicable. No additional management support is needed unless otherwise documented below in the visit note. 

## 2016-03-26 ENCOUNTER — Ambulatory Visit: Payer: Medicare Other | Admitting: Family Medicine

## 2016-04-23 ENCOUNTER — Other Ambulatory Visit: Payer: Self-pay | Admitting: Family Medicine

## 2016-04-30 ENCOUNTER — Ambulatory Visit (INDEPENDENT_AMBULATORY_CARE_PROVIDER_SITE_OTHER): Payer: Medicare Other | Admitting: Family Medicine

## 2016-04-30 ENCOUNTER — Telehealth: Payer: Self-pay

## 2016-04-30 DIAGNOSIS — Z23 Encounter for immunization: Secondary | ICD-10-CM | POA: Diagnosis not present

## 2016-04-30 MED ORDER — ALPRAZOLAM 0.25 MG PO TABS: 0.2500 mg | ORAL_TABLET | Freq: Two times a day (BID) | ORAL | 0 refills | Status: DC | PRN

## 2016-04-30 NOTE — Telephone Encounter (Signed)
Medication verbally called in.

## 2016-04-30 NOTE — Telephone Encounter (Signed)
Pt would like a refill on Alprazolam to her [facility]. Last refill 03/11/2016 #90, 1 rf. Last OV 03/25/2016. Please advise.

## 2016-04-30 NOTE — Telephone Encounter (Signed)
One refill OK.  I would like for her to try to reduce frequency of use if possible.

## 2016-05-09 ENCOUNTER — Telehealth: Payer: Self-pay | Admitting: Family Medicine

## 2016-05-09 NOTE — Telephone Encounter (Signed)
Noted. Appointment with Dr. Elease Hashimoto Friday, October 13th.

## 2016-05-09 NOTE — Telephone Encounter (Signed)
Patient Name: Emily Cannon  DOB: June 08, 1933    Initial Comment Caller states she had a Cortisone shot a month ago, in her wrist. Now she has two knots where she got the shot at.   Nurse Assessment  Nurse: Raphael Gibney, RN, Vera Date/Time (Eastern Time): 05/09/2016 2:15:10 PM  Confirm and document reason for call. If symptomatic, describe symptoms. You must click the next button to save text entered. ---Caller states she had a cortisone injection on her right wrist about a month. Has 2 knots where she got the injections. No pain. Noticed knots about a week after she got the injection. She is still using the cream.  Has the patient traveled out of the country within the last 30 days? ---Not Applicable  Does the patient have any new or worsening symptoms? ---Yes  Will a triage be completed? ---Yes  Related visit to physician within the last 2 weeks? ---No  Does the PT have any chronic conditions? (i.e. diabetes, asthma, etc.) ---No  Is this a behavioral health or substance abuse call? ---No     Guidelines    Guideline Title Affirmed Question Affirmed Notes  Skin Lump or Localized Swelling [1] Small swelling or lump AND [2] unexplained AND [3] present > 1 week    Final Disposition User   See PCP When Office is Open (within 3 days) Raphael Gibney, RN, Vanita Ingles    Comments  appt scheduled for 10 am with Dr. Carolann Littler on 05/10/2016   Referrals  REFERRED TO PCP OFFICE   Disagree/Comply: Leta Baptist

## 2016-05-10 ENCOUNTER — Ambulatory Visit (INDEPENDENT_AMBULATORY_CARE_PROVIDER_SITE_OTHER): Payer: Medicare Other | Admitting: Family Medicine

## 2016-05-10 VITALS — BP 128/68 | HR 70 | Temp 98.4°F | Ht 60.6 in | Wt 128.1 lb

## 2016-05-10 DIAGNOSIS — M67431 Ganglion, right wrist: Secondary | ICD-10-CM | POA: Diagnosis not present

## 2016-05-10 NOTE — Progress Notes (Signed)
Subjective:     Patient ID: Emily Cannon, female   DOB: [birth year], 82 y.o.   MRN: [id number]  HPI Patient recently seen with tendinitis of the thumb. We injected with steroids and she's had almost total resolution of her wrist pain. She does have couple small mobile cystic lesions now which she had not noted previously. These are nontender. She is getting back to her usual activities. Denies any prior history of ganglion cyst.  Past Medical History:  Diagnosis Date  . Acquired absence of organ, genital organs   . Allergic rhinitis, cause unspecified   . Anxiety state, unspecified   . Disorder of bone and cartilage, unspecified   . Irritable bowel syndrome   . Lumbago   . Other and unspecified hyperlipidemia   . Perforation of tympanic membrane, unspecified   . Unspecified essential hypertension    Past Surgical History:  Procedure Laterality Date  . ABDOMINAL HYSTERECTOMY    . cataract surgery  5/12 and 12/24/2012   both eyes    reports that she has never smoked. She has never used smokeless tobacco. She reports that she does not drink alcohol or use drugs. family history includes COPD in her other; Lung cancer (age of onset: 29) in her mother; Lung disease (age of onset: 47) in her father. Allergies  Allergen Reactions  . Alendronate Sodium     REACTION: pt states "short of breath"  . Amoxicillin     REACTION: causes severe diarrhea  . Codeine     REACTION: pt states nausea  . Valsartan     REACTION: gums/throat swelling     Review of Systems  Neurological: Negative for weakness and numbness.       Objective:   Physical Exam  Constitutional: She appears well-developed and well-nourished.  Cardiovascular: Normal rate and regular rhythm.   Pulmonary/Chest: Effort normal and breath sounds normal. No respiratory distress. She has no wheezes. She has no rales.  Musculoskeletal:  Patient has full range of motion right wrist. She has a couple of small mobile rounded  nontender cystic lesions on the radial aspect of the wrist       Assessment:     Benign-appearing ganglion cyst right wrist    Plan:     -We explained that these are frequently related to inflammation around joint, injury, repetitive movement or use, or arthritis. She is reassured these are benign -Recommend observation for now. Follow-up if these become pain for other concerns  Eulas Post MD [facility] at [facility]

## 2016-05-10 NOTE — Progress Notes (Signed)
Pre visit review using our clinic review tool, if applicable. No additional management support is needed unless otherwise documented below in the visit note. 

## 2016-05-10 NOTE — Patient Instructions (Signed)
Ganglion Cyst  A ganglion cyst is a noncancerous, fluid-filled lump that occurs near joints or tendons. The ganglion cyst grows out of a joint or the lining of a tendon. It most often develops in the hand or wrist, but it can also develop in the shoulder, elbow, hip, knee, ankle, or foot. The round or oval ganglion cyst can be the size of a pea or larger than a grape. Increased activity may enlarge the size of the cyst because more fluid starts to build up.   CAUSES  It is not known what causes a ganglion cyst to grow. However, it may be related to:  · Inflammation or irritation around the joint.  · An injury.  · Repetitive movements or overuse.  · Arthritis.  RISK FACTORS  Risk factors include:  · Being a woman.  · Being age 20-50.  SIGNS AND SYMPTOMS  Symptoms may include:   · A lump. This most often appears on the hand or wrist, but it can occur in other areas of the body.  · Tingling.  · Pain.  · Numbness.  · Muscle weakness.  · Weak grip.  · Less movement in a joint.  DIAGNOSIS  Ganglion cysts are most often diagnosed based on a physical exam. Your health care provider will feel the lump and may shine a light alongside it. If it is a ganglion cyst, a light often shines through it. Your health care provider may order an X-ray, ultrasound, or MRI to rule out other conditions.  TREATMENT  Ganglion cysts usually go away on their own without treatment. If pain or other symptoms are involved, treatment may be needed. Treatment is also needed if the ganglion cyst limits your movement or if it gets infected. Treatment may include:  · Wearing a brace or splint on your wrist or finger.  · Taking anti-inflammatory medicine.  · Draining fluid from the lump with a needle (aspiration).  · Injecting a steroid into the joint.  · Surgery to remove the ganglion cyst.  HOME CARE INSTRUCTIONS  · Do not press on the ganglion cyst, poke it with a needle, or hit it.  · Take medicines only as directed by your health care  provider.  · Wear your brace or splint as directed by your health care provider.  · Watch your ganglion cyst for any changes.  · Keep all follow-up visits as directed by your health care provider. This is important.  SEEK MEDICAL CARE IF:  · Your ganglion cyst becomes larger or more painful.  · You have increased redness, red streaks, or swelling.  · You have pus coming from the lump.  · You have weakness or numbness in the affected area.  · You have a fever or chills.     This information is not intended to replace advice given to you by your health care provider. Make sure you discuss any questions you have with your health care provider.     Document Released: 07/12/2000 Document Revised: 08/05/2014 Document Reviewed: 12/28/2013  Elsevier Interactive Patient Education ©2016 Elsevier Inc.

## 2016-05-28 ENCOUNTER — Encounter: Payer: Self-pay | Admitting: Family Medicine

## 2016-05-28 ENCOUNTER — Telehealth: Payer: Self-pay

## 2016-05-28 ENCOUNTER — Ambulatory Visit (INDEPENDENT_AMBULATORY_CARE_PROVIDER_SITE_OTHER): Payer: Medicare Other | Admitting: Family Medicine

## 2016-05-28 VITALS — BP 138/62 | Ht 60.72 in | Wt 131.0 lb

## 2016-05-28 DIAGNOSIS — R55 Syncope and collapse: Secondary | ICD-10-CM | POA: Diagnosis not present

## 2016-05-28 NOTE — Patient Instructions (Signed)

## 2016-05-28 NOTE — Telephone Encounter (Signed)
Pt presented as walk-in for triage. Pt was exercising at [facility] and was using equipment when she got lightheaded and passed out. Positive for LOC approximately 30sec. EMS was called and they obtained vital signs and EKG. They advised pt to go to ED. Pt declined and came to office instead. Pt brought EMS EKG for comparison. Pt added to Dr. Erick Blinks schedule and seen immediately.

## 2016-05-28 NOTE — Progress Notes (Signed)
Subjective:     Patient ID: Emily Cannon, female   DOB: [birth year], 82 y.o.   MRN: [id number]  HPI Patient seen as a walk-in following reported syncopal episode earlier today around 1:30 PM while exercising at the [facility]. 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 She recalled feeling somewhat lightheaded for at least couple minutes prior to syncopal episode which was estimated 30 seconds loss of consciousness. This was observed by several folks including the trainer she was working with. She did have some urine incontinence. No reported obvious seizure activity. No prior history of seizures. Denied any chest pains, dyspnea, or any focal weakness or speech changes.  EMS was called and patient apparently had blood pressure of 156/90 with capillary blood glucose of 162. Heart rate reported at 70 and regular. EKG strip is reviewed and reveals sinus rhythm with no acute changes.  Patient feels well at this time. No further dizziness. Denied any preceding pain or other clear precipitants. She denies any confusion afterwards. She did not describe any lethargy or post ictal type symptoms.  Patient reportedly had presyncopal episode back in 2011 evaluated per cardiology. Echocardiogram at that point was generally unremarkable. She is not aware of any other further testing.  Her chronic problems are as outlined below. She has hypertension which has been challenging to manage but has not had any recent orthostatic type symptoms. She is compliant with all medications. She's not aware of any recent bradycardia. No history of CAD or known peripheral vascular disease. No recent chest pains. Has generally had good exercise tolerance in the past.  Past Medical History:  Diagnosis Date  . Acquired absence of organ, genital organs   . Allergic rhinitis, cause unspecified   . Anxiety state, unspecified   . Disorder of bone and cartilage,  unspecified   . Irritable bowel syndrome   . Lumbago   . Other and unspecified hyperlipidemia   . Perforation of tympanic membrane, unspecified   . Unspecified essential hypertension    Past Surgical History:  Procedure Laterality Date  . ABDOMINAL HYSTERECTOMY    . cataract surgery  5/12 and 12/24/2012   both eyes    reports that she has never smoked. She has never used smokeless tobacco. She reports that she does not drink alcohol or use drugs. family history includes COPD in her other; Lung cancer (age of onset: 20) in her mother; Lung disease (age of onset: 99) in her father. Allergies  Allergen Reactions  . Alendronate Sodium     REACTION: pt states "short of breath"  . Amoxicillin     REACTION: causes severe diarrhea  . Codeine     REACTION: pt states nausea  . Valsartan     REACTION: gums/throat swelling     Review of Systems  Constitutional: Negative for chills, fatigue and fever.  Eyes: Negative for visual disturbance.  Respiratory: Negative for cough, chest tightness, shortness of breath and wheezing.   Cardiovascular: Negative for chest pain, palpitations and leg swelling.  Gastrointestinal: Negative for abdominal pain, diarrhea, nausea and vomiting.  Genitourinary: Negative for dysuria.  Neurological: Positive for syncope. Negative for dizziness, seizures, weakness, light-headedness and headaches.  Psychiatric/Behavioral: Negative for agitation and confusion.       Objective:   Physical Exam  Constitutional: She is oriented to person, place, and time. She appears well-developed and well-nourished.  HENT:  Mouth/Throat: Oropharynx is clear and moist.  Eyes: Pupils  are equal, round, and reactive to light.  Neck: Neck supple. No JVD present. No thyromegaly present.  Cardiovascular: Normal rate and regular rhythm.  Exam reveals no gallop.   Pulmonary/Chest: Effort normal and breath sounds normal. No respiratory distress. She has no wheezes. She has no rales.   Musculoskeletal: She exhibits no edema.  Neurological: She is alert and oriented to person, place, and time. No cranial nerve deficit. Coordination normal.  No focal weakness. Gait normal.  Psychiatric: She has a normal mood and affect. Her behavior is normal. Judgment and thought content normal.       Assessment:     Syncopal episode earlier today. Etiology unclear. Doubt seizure episode. Nonfocal exam at this point. EKG strip per EMS reviewed with normal sinus rhythm    Plan:     -Set up carotid Dopplers -Consider repeat echocardiogram -Set up referral to cardiology for further evaluation -We'll probably need event monitor but will defer to cardiology -Avoid driving until further evaluated -Follow-up immediately for any recurrent symptoms/episodes of syncope. -will bring back tomorrow for labs.  She left before these were obtained.  Eulas Post MD [facility] at [facility]

## 2016-05-31 ENCOUNTER — Other Ambulatory Visit (INDEPENDENT_AMBULATORY_CARE_PROVIDER_SITE_OTHER): Payer: Medicare Other

## 2016-05-31 DIAGNOSIS — R55 Syncope and collapse: Secondary | ICD-10-CM

## 2016-05-31 LAB — CBC WITH DIFFERENTIAL/PLATELET
BASOS ABS: 0 10*3/uL (ref 0.0–0.1)
Basophils Relative: 0.6 % (ref 0.0–3.0)
Eosinophils Absolute: 0.1 10*3/uL (ref 0.0–0.7)
Eosinophils Relative: 1 % (ref 0.0–5.0)
HEMATOCRIT: 40.1 % (ref 36.0–46.0)
HEMOGLOBIN: 13.6 g/dL (ref 12.0–15.0)
LYMPHS PCT: 21.6 % (ref 12.0–46.0)
Lymphs Abs: 1.7 10*3/uL (ref 0.7–4.0)
MCHC: 33.8 g/dL (ref 30.0–36.0)
MCV: 90.6 fl (ref 78.0–100.0)
MONOS PCT: 9 % (ref 3.0–12.0)
Monocytes Absolute: 0.7 10*3/uL (ref 0.1–1.0)
NEUTROS PCT: 67.8 % (ref 43.0–77.0)
Neutro Abs: 5.4 10*3/uL (ref 1.4–7.7)
Platelets: 263 10*3/uL (ref 150.0–400.0)
RBC: 4.43 Mil/uL (ref 3.87–5.11)
RDW: 13.4 % (ref 11.5–15.5)
WBC: 8 10*3/uL (ref 4.0–10.5)

## 2016-05-31 LAB — COMPREHENSIVE METABOLIC PANEL
ALK PHOS: 55 U/L (ref 39–117)
ALT: 16 U/L (ref 0–35)
AST: 18 U/L (ref 0–37)
Albumin: 4.6 g/dL (ref 3.5–5.2)
BILIRUBIN TOTAL: 0.4 mg/dL (ref 0.2–1.2)
BUN: 19 mg/dL (ref 6–23)
CALCIUM: 10 mg/dL (ref 8.4–10.5)
CO2: 27 mEq/L (ref 19–32)
Chloride: 100 mEq/L (ref 96–112)
Creatinine, Ser: 0.78 mg/dL (ref 0.40–1.20)
GFR: 75.34 mL/min (ref >60.00)
GLUCOSE: 87 mg/dL (ref 70–99)
Potassium: 3.8 mEq/L (ref 3.5–5.1)
Sodium: 136 mEq/L (ref 135–145)
TOTAL PROTEIN: 7.5 g/dL (ref 6.0–8.3)

## 2016-06-02 ENCOUNTER — Other Ambulatory Visit: Payer: Self-pay | Admitting: Family Medicine

## 2016-06-04 ENCOUNTER — Encounter: Payer: Self-pay | Admitting: Cardiology

## 2016-06-05 ENCOUNTER — Ambulatory Visit (INDEPENDENT_AMBULATORY_CARE_PROVIDER_SITE_OTHER): Payer: Medicare Other | Admitting: Family Medicine

## 2016-06-05 VITALS — BP 170/80 | HR 71 | Temp 97.6°F | Ht 60.0 in | Wt 129.4 lb

## 2016-06-05 DIAGNOSIS — I10 Essential (primary) hypertension: Secondary | ICD-10-CM

## 2016-06-05 DIAGNOSIS — R55 Syncope and collapse: Secondary | ICD-10-CM | POA: Diagnosis not present

## 2016-06-05 NOTE — Progress Notes (Signed)
Subjective:     Patient ID: Emily Cannon, female   DOB: 02/08/1933, 82 y.o.   MRN: [id number]  HPI Patient seen for follow-up regarding recent syncopal episode which occurred while exercising with her trainer around 1:30 PM one week ago. Refer to recent note for details:  "Patient seen as a walk-in following reported syncopal episode earlier today around 1:30 PM while exercising at the [facility]. 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 She recalled feeling somewhat lightheaded for at least couple minutes prior to syncopal episode which was estimated 30 seconds loss of consciousness. This was observed by several folks including the trainer she was working with. She did have some urine incontinence. No reported obvious seizure activity. No prior history of seizures. Denied any chest pains, dyspnea, or any focal weakness or speech changes.  EMS was called and patient apparently had blood pressure of 156/90 with capillary blood glucose of 162. Heart rate reported at 70 and regular. EKG strip is reviewed and reveals sinus rhythm with no acute changes.  Patient feels well at this time. No further dizziness. Denied any preceding pain or other clear precipitants. She denies any confusion afterwards. She did not describe any lethargy or post ictal type symptoms.  Patient reportedly had presyncopal episode back in 2011 evaluated per cardiology. Echocardiogram at that point was generally unremarkable. She is not aware of any other further testing."  Patient has not done any further lifting and has not had any further episodes whatsoever. No dizziness. No chest pains. No dyspnea. We had ordered carotid Dopplers along with echocardiogram. She has appointment see cardiology next week. She's not aware of any recent palpitations. Blood pressures generally been controlled recently. She has history of labile blood pressure and tends to be very  anxious. She states that she usually takes a low-dose Alprazolam prior to office visits here and has had several recent good pressures but up today. She feels somewhat more anxious today.  Past Medical History:  Diagnosis Date  . Acquired absence of organ, genital organs   . Allergic rhinitis, cause unspecified   . Anxiety state, unspecified   . Disorder of bone and cartilage, unspecified   . Irritable bowel syndrome   . Lumbago   . Other and unspecified hyperlipidemia   . Perforation of tympanic membrane, unspecified   . Unspecified essential hypertension    Past Surgical History:  Procedure Laterality Date  . ABDOMINAL HYSTERECTOMY    . cataract surgery  5/12 and 12/24/2012   both eyes    reports that she has never smoked. She has never used smokeless tobacco. She reports that she does not drink alcohol or use drugs. family history includes COPD in her other; Lung cancer (age of onset: 17) in her mother; Lung disease (age of onset: 45) in her father. Allergies  Allergen Reactions  . Alendronate Sodium     REACTION: pt states "short of breath"  . Amoxicillin     REACTION: causes severe diarrhea  . Codeine     REACTION: pt states nausea  . Valsartan     REACTION: gums/throat swelling     Review of Systems  Constitutional: Negative for fatigue.  Eyes: Negative for visual disturbance.  Respiratory: Negative for cough, chest tightness, shortness of breath and wheezing.   Cardiovascular: Negative for chest pain, palpitations and leg swelling.  Neurological: Positive for syncope (recent syncope as per history of present illness). Negative for  dizziness, seizures, weakness, light-headedness and headaches.       Objective:   Physical Exam  Constitutional: She is oriented to person, place, and time. She appears well-developed and well-nourished. No distress.  HENT:  Mouth/Throat: Oropharynx is clear and moist.  Neck: Neck supple.  Cardiovascular: Normal rate and regular  rhythm.  Exam reveals no gallop.   Pulmonary/Chest: Effort normal and breath sounds normal. No respiratory distress. She has no wheezes. She has no rales.  Musculoskeletal: She exhibits no edema.  Neurological: She is alert and oriented to person, place, and time. No cranial nerve deficit.       Assessment:     Recent syncopal episode while exercising. She's not had any orthostatic changes and ,in fact, blood pressure is quite elevated today. She has labile hypertension and probable white coat syndrome. Blood pressure generally been very well-controlled at home    Plan:     -Further evaluation with carotid Dopplers and echocardiogram pending. -Continue follow-up with cardiology as scheduled. -We feel that she she should OK to do some light walking for exercise but would avoid any weight lifting until further evaluated by cardiology -Monitor blood pressures closely at home and be in touch if consistently greater than 160/90  Eulas Post MD [facility] at [facility]

## 2016-06-05 NOTE — Progress Notes (Signed)
HPI: 82 year old female for evaluation of syncope. Echocardiogram May 2011 showed normal LV function. Renal Dopplers December 2015 normal. Syncopal episode recently while exercising. Apparently blood pressure per EMS was 156/90. Glucose 162. EMS rhythm strip apparently showed sinus rhythm (not available for review). Patient states that she typically does not have dyspnea on exertion, orthopnea, PND, pedal edema or syncope. No exertional chest pain. Occasional palpitations. She was exercising at the time of her recent event. She developed a "queasy" feeling followed by syncope. No preceding palpitations, chest pain or dyspnea. No incontinence. She was unconscious for approximately 1 minute. No recurrences since. Because of the above cardiology asked to evaluate. She does not have orthostatic symptoms.   Current Outpatient Prescriptions  Medication Sig Dispense Refill  . ALPRAZolam (XANAX) 0.25 MG tablet Take 1 tablet (0.25 mg total) by mouth 2 (two) times daily as needed for anxiety. 90 tablet 0  . amLODipine (NORVASC) 10 MG tablet TAKE 1 TABLET (10 MG TOTAL) BY MOUTH DAILY. 30 tablet 5  . aspirin 81 MG tablet Take 81 mg by mouth every evening.     Marland Kitchen atorvastatin (LIPITOR) 20 MG tablet TAKE 1 TABLET (20 MG TOTAL) BY MOUTH DAILY. 30 tablet 10  . diclofenac sodium (VOLTAREN) 1 % GEL Apply 2 g topically 4 (four) times daily. 100 g 1  . diphenhydramine-acetaminophen (TYLENOL PM) 25-500 MG TABS Take 1 tablet by mouth at bedtime as needed.      Marland Kitchen gemfibrozil (LOPID) 600 MG tablet TAKE 1 TABLET BY MOUTH DAILY. 30 tablet 11  . hydrALAZINE (APRESOLINE) 50 MG tablet Take 1 tablet (50 mg total) by mouth 2 (two) times daily. 60 tablet 11  . hydrochlorothiazide (MICROZIDE) 12.5 MG capsule TAKE ONE CAPSULE (12.5 MG TOTAL) BY MOUTH DAILY. 30 capsule 11  . levothyroxine (SYNTHROID, LEVOTHROID) 50 MCG tablet TAKE 1 TABLET (50 MCG TOTAL) BY MOUTH DAILY BEFORE BREAKFAST. 30 tablet 3  . metoprolol succinate  (TOPROL-XL) 50 MG 24 hr tablet TAKE 1 TABLET (50 MG TOTAL) BY MOUTH 2 (TWO) TIMES DAILY. 60 tablet 11  . spironolactone (ALDACTONE) 25 MG tablet TAKE 1 TABLET (25 MG TOTAL) BY MOUTH DAILY. 30 tablet 5   No current facility-administered medications for this visit.     Allergies  Allergen Reactions  . Alendronate Sodium     REACTION: pt states "short of breath"  . Amoxicillin     REACTION: causes severe diarrhea  . Codeine     REACTION: pt states nausea  . Valsartan     REACTION: gums/throat swelling     Past Medical History:  Diagnosis Date  . Acquired absence of organ, genital organs   . Allergic rhinitis, cause unspecified   . Anxiety state, unspecified   . Disorder of bone and cartilage, unspecified   . Hypothyroid   . Irritable bowel syndrome   . Lumbago   . Other and unspecified hyperlipidemia   . Perforation of tympanic membrane, unspecified   . Unspecified essential hypertension     Past Surgical History:  Procedure Laterality Date  . ABDOMINAL HYSTERECTOMY    . cataract surgery  5/12 and 12/24/2012   both eyes  . TONSILLECTOMY      Social History   Social History  . Marital status: Married    Spouse name: Jeneen Rinks  . Number of children: 2  . Years of education: N/A   Occupational History  . retired Research scientist (physical sciences)    Social History Main Topics  . Smoking status: Never Smoker  .  Smokeless tobacco: Never Used  . Alcohol use No  . Drug use: No  . Sexual activity: Not on file   Other Topics Concern  . Not on file   Social History Narrative   Son is Doug Coble--pt here     Family History  Problem Relation Age of Onset  . Lung cancer Mother 10  . Lung disease Father 83  . COPD Other     sibling  . Colon cancer Neg Hx     ROS: no fevers or chills, productive cough, hemoptysis, dysphasia, odynophagia, melena, hematochezia, dysuria, hematuria, rash, seizure activity, orthopnea, PND, pedal edema, claudication. Remaining systems are  negative.  Physical Exam:   Blood pressure (!) 146/84, pulse 71, height 5' (1.524 m), weight 129 lb (58.5 kg).  General:  Well developed/well nourished in NAD Skin warm/dry Patient not depressed No peripheral clubbing Back-normal HEENT-normal/normal eyelids Neck supple/normal carotid upstroke bilaterally; no bruits; no JVD; no thyromegaly chest - CTA/ normal expansion CV - RRR/normal S1 and S2; no murmurs, rubs or gallops;  PMI nondisplaced Abdomen -NT/ND, no HSM, no mass, + bowel sounds, no bruit 2+ femoral pulses, no bruits Ext-no edema, chords, 2+ DP Neuro-grossly nonfocal  ECG -Sinus rhythm at a rate of 71. Normal axis. Nonspecific ST changes.  A/P  1 Syncope-etiology unclear. She did have a queasy feeling prior to her event and had not been drinking as much fluid. I wonder if she may have had a vagal episode. I will arrange a stress echocardiogram to screen for coronary disease given this occurred while exercising. This will also quantitate LV function. I will not pursue an event monitor unless she has recurrent episodes. Patient has been instructed not to drive for 6 months from previous event.  2 hypertension-blood pressure is borderline. We will follow this and increase medications as needed.  3 hyperlipidemia-continue statin.  Kirk Ruths, MD

## 2016-06-05 NOTE — Patient Instructions (Signed)
Monitor blood pressure at home and be in touch if consistently > 160/90 OK to try some simple walking Would not do any weight lifting at this time Will go ahead and set up cardiac event monitor.

## 2016-06-05 NOTE — Progress Notes (Signed)
Pre visit review using our clinic review tool, if applicable. No additional management support is needed unless otherwise documented below in the visit note. 

## 2016-06-10 ENCOUNTER — Encounter: Payer: Self-pay | Admitting: Cardiology

## 2016-06-10 ENCOUNTER — Ambulatory Visit (INDEPENDENT_AMBULATORY_CARE_PROVIDER_SITE_OTHER): Payer: Medicare Other | Admitting: Cardiology

## 2016-06-10 VITALS — BP 146/84 | HR 71 | Ht 60.0 in | Wt 129.0 lb

## 2016-06-10 DIAGNOSIS — I10 Essential (primary) hypertension: Secondary | ICD-10-CM

## 2016-06-10 DIAGNOSIS — E7800 Pure hypercholesterolemia, unspecified: Secondary | ICD-10-CM | POA: Diagnosis not present

## 2016-06-10 DIAGNOSIS — R55 Syncope and collapse: Secondary | ICD-10-CM

## 2016-06-10 NOTE — Patient Instructions (Signed)
Medication Instructions:   NO CHANGE  Testing/Procedures:  Your physician has requested that you have a stress echocardiogram. For further information please visit [email or web address]. Please follow instruction sheet as given.    Follow-Up:  Your physician recommends that you schedule a follow-up appointment in: 3 MONTHS WITH DR Stanford Breed   Exercise Stress Echocardiogram An exercise stress echocardiogram is a heart (cardiac) test used to check the function of your heart. This test may also be called an exercise stress echocardiography or stress echo. This stress test will check how well your heart muscle and valves are working and determine if your heart muscle is getting enough blood. You will exercise on a treadmill to naturally increase or stress the functioning of your heart.  An echocardiogram uses sound waves (ultrasound) to produce an image of your heart. If your heart does not work normally, it may indicate coronary artery disease with poor coronary blood supply. The coronary arteries are the arteries that bring blood and oxygen to your heart. LET [facility] CARE PROVIDER KNOW ABOUT:  Any allergies you have.  All medicines you are taking, including vitamins, herbs, eye drops, creams, and over-the-counter medicines.  Previous problems you or members of your family have had with the use of anesthetics.  Any blood disorders you have.  Previous surgeries you have had.  Medical conditions you have.  Possibility of pregnancy, if this applies. RISKS AND COMPLICATIONS Generally, this is a safe procedure. However, as with any procedure, complications can occur. Possible complications can include:  You develop pain or pressure in the following areas:  Chest.  Jaw or neck.  Between your shoulder blades.  Radiating down your left arm.  Dizziness or lightheadedness.  Shortness of breath.  Increased or irregular heartbeat.  Nausea or vomiting.  Heart attack  (rare). BEFORE THE PROCEDURE  Avoid all forms of caffeine for 24 hours before your test or as directed by your health care provider. This includes coffee, tea (even decaffeinated tea), caffeinated sodas, chocolate, cocoa, and certain pain medicines.  Follow your health care provider's instructions regarding eating and drinking before the test.  Take your medicines as directed at regular times with water unless instructed otherwise. Exceptions may include:  If you have diabetes, ask how you are to take your insulin or pills. It is common to adjust insulin dosing the morning of the test.  If you are taking beta-blocker medicines, it is important to talk to your health care provider about these medicines well before the date of your test. Taking beta-blocker medicines may interfere with the test. In some cases, these medicines need to be changed or stopped 24 hours or more before the test.  If you wear a nitroglycerin patch, it may need to be removed prior to the test. Ask your health care provider if the patch should be removed before the test.  If you use an inhaler for any breathing condition, bring it with you to the test.  If you are an outpatient, bring a snack so you can eat right after the stress phase of the test.  Do not smoke for 4 hours prior to the test or as directed by your health care provider.  Wear loose-fitting clothes and comfortable shoes for the test. This test involves walking on a treadmill. PROCEDURE   Multiple electrodes will be put on your chest. If needed, small areas of your chest may be shaved to get better contact with the electrodes. Once the electrodes are attached to your body, multiple  wires will be attached to the electrodes, and your heart rate will be monitored.  You will have an echocardiogram done at rest.  To produce this image of your heart, gel is applied to your chest, and a wand-like tool (transducer) is moved over the chest. The transducer sends  the sound waves through the chest to create the moving images of your heart.  You may need an IV to receive a medication that improves the quality of the pictures.  You will then walk on a treadmill. The treadmill will be started at a slow pace. The treadmill speed and incline will gradually be increased to raise your heart rate.  At the peak of exercise, the treadmill will be stopped. You will lie down immediately on a bed so that a second echocardiogram can be done to visualize your heart's motion with exercise.  The test usually takes 30-60 minutes to complete. AFTER THE PROCEDURE  Your heart rate and blood pressure will be monitored after the test.  You may return to your normal schedule, including diet, activities, and medicines, unless your health care provider tells you otherwise.   This information is not intended to replace advice given to you by your health care provider. Make sure you discuss any questions you have with your health care provider.   Document Released: 07/19/2004 Document Revised: 07/20/2013 Document Reviewed: 03/22/2013 Elsevier Interactive Patient Education [employer].

## 2016-06-11 ENCOUNTER — Other Ambulatory Visit (HOSPITAL_COMMUNITY): Payer: Medicare Other

## 2016-06-14 ENCOUNTER — Telehealth: Payer: Self-pay | Admitting: *Deleted

## 2016-06-14 NOTE — Telephone Encounter (Signed)
Poke with patient regarding stress echo appointment---scheduled 07/09/16 @ 2:30 pm [id number]--[address] floor.  I told patient I would mail information to her.  She voiced her understanding.

## 2016-06-15 ENCOUNTER — Other Ambulatory Visit: Payer: Self-pay | Admitting: Family Medicine

## 2016-06-18 ENCOUNTER — Other Ambulatory Visit: Payer: Self-pay

## 2016-06-18 MED ORDER — ALPRAZOLAM 0.25 MG PO TABS: 0.2500 mg | ORAL_TABLET | Freq: Two times a day (BID) | ORAL | 0 refills | Status: DC | PRN

## 2016-06-19 ENCOUNTER — Ambulatory Visit (HOSPITAL_COMMUNITY)
Admission: RE | Admit: 2016-06-19 | Discharge: 2016-06-19 | Disposition: A | Discharge status: Home / Self Care | Payer: Medicare Other | Source: Ambulatory Visit | Attending: Internal Medicine | Admitting: Internal Medicine

## 2016-06-19 DIAGNOSIS — R55 Syncope and collapse: Secondary | ICD-10-CM | POA: Insufficient documentation

## 2016-06-19 DIAGNOSIS — I10 Essential (primary) hypertension: Secondary | ICD-10-CM | POA: Insufficient documentation

## 2016-06-19 DIAGNOSIS — E785 Hyperlipidemia, unspecified: Secondary | ICD-10-CM | POA: Diagnosis not present

## 2016-07-04 ENCOUNTER — Other Ambulatory Visit (HOSPITAL_COMMUNITY): Payer: Medicare Other

## 2016-07-05 ENCOUNTER — Telehealth: Payer: Self-pay | Admitting: Cardiology

## 2016-07-05 ENCOUNTER — Telehealth (HOSPITAL_COMMUNITY): Payer: Self-pay | Admitting: *Deleted

## 2016-07-05 NOTE — Telephone Encounter (Signed)
Pt needs a call back to clearify her medication intake on Tuesday 12th Dec 2017.  Please give her a call back before then.  Pt sated Emily Cannon called her

## 2016-07-05 NOTE — Telephone Encounter (Signed)
Spoke to patient, reviewed and clarified her preprocedure instructions for cardiovasc stress test. She is aware to hold the metoprolol 24 hrs prior, no other changes to meds. Recommended avoidance of caffeine 24 hrs prior to test. Patient aware if she needs anything further, to call.

## 2016-07-05 NOTE — Telephone Encounter (Signed)
Left message on voicemail per DPR in reference to upcoming appointment scheduled on 07/09/16 at 2:30 with detailed instructions given per Stress Test Requisition Sheet for the test. LM to arrive 30 minutes early, and that it is imperative to arrive on time for appointment to keep from having the test rescheduled. If you need to cancel or reschedule your appointment, please call the office within 24 hours of your appointment. Failure to do so may result in a cancellation of your appointment, and a $50 no show fee. Phone number given for call back for any questions. Emily Cannon

## 2016-07-09 ENCOUNTER — Encounter (HOSPITAL_COMMUNITY): Payer: Self-pay | Admitting: *Deleted

## 2016-07-09 ENCOUNTER — Ambulatory Visit (HOSPITAL_BASED_OUTPATIENT_CLINIC_OR_DEPARTMENT_OTHER): Payer: Medicare Other

## 2016-07-09 ENCOUNTER — Ambulatory Visit (HOSPITAL_COMMUNITY): Payer: Medicare Other | Attending: Cardiology

## 2016-07-09 DIAGNOSIS — R55 Syncope and collapse: Secondary | ICD-10-CM | POA: Diagnosis not present

## 2016-07-09 DIAGNOSIS — I10 Essential (primary) hypertension: Secondary | ICD-10-CM | POA: Diagnosis not present

## 2016-07-09 DIAGNOSIS — R0989 Other specified symptoms and signs involving the circulatory and respiratory systems: Secondary | ICD-10-CM

## 2016-07-09 NOTE — Progress Notes (Signed)
Patient had Ekg Chages during stress echo and was not symptomatic so DOD, Dr. Radford Pax was consultant . Dr. Radford Pax wanted stress echo to be read by Dr. Acie Fredrickson. Per Dr. Acie Fredrickson stress echo was normal and patient was discharge.

## 2016-07-09 NOTE — Progress Notes (Unsigned)
Patient had Ekg Chages during stress echo and was not symptomatic so DOD, Dr. Radford Pax was consultant . Dr. Radford Pax wanted stress echo to be read by Dr. Acie Fredrickson. Per Dr. Acie Fredrickson stress echo was normal and patient was discharge.

## 2016-07-10 ENCOUNTER — Other Ambulatory Visit (HOSPITAL_COMMUNITY): Payer: Medicare Other

## 2016-07-15 ENCOUNTER — Telehealth: Payer: Self-pay | Admitting: Family Medicine

## 2016-07-15 NOTE — Telephone Encounter (Signed)
We are aware of the potential for interaction. However, she has been on this combination for years (well before we saw her).  She has not had myalgias or hepatic issues on this combination.

## 2016-07-15 NOTE — Telephone Encounter (Signed)
Please review

## 2016-07-15 NOTE — Telephone Encounter (Signed)
° ° ° °  Pharmacy  call to say that the following med   gemfibrozil (LOPID) 600 MG tablet   Interacts with the below med  Atorvastatin    Would like a call back before filling  [place] [phone]

## 2016-07-15 NOTE — Telephone Encounter (Signed)
Pharmacy is aware that it is okay to refill both medications.

## 2016-07-30 ENCOUNTER — Other Ambulatory Visit: Payer: Self-pay | Admitting: Family Medicine

## 2016-07-30 NOTE — Telephone Encounter (Signed)
Refill once 

## 2016-07-30 NOTE — Telephone Encounter (Signed)
Pt would like a refill last refill was 1129/2017. Please advise.

## 2016-08-08 ENCOUNTER — Ambulatory Visit (INDEPENDENT_AMBULATORY_CARE_PROVIDER_SITE_OTHER): Payer: Medicare Other

## 2016-08-08 DIAGNOSIS — Z0000 Encounter for general adult medical examination without abnormal findings: Secondary | ICD-10-CM | POA: Diagnosis not present

## 2016-08-08 DIAGNOSIS — Z23 Encounter for immunization: Secondary | ICD-10-CM

## 2016-08-08 NOTE — Patient Instructions (Addendum)
Emily Cannon , Thank you for taking time to come for your Medicare Wellness Visit. I appreciate your ongoing commitment to your health goals. Please review the following plan we discussed and let me know if I can assist you in the future.   Will have her PSV 23 today  Scheduling AWV - noted at the end of the assessment and added but discussed   These are the goals we discussed: Goals    . relaxation          Will consider massage and ways of reducing        This is a list of the screening recommended for you and due dates:  Health Maintenance  Topic Date Due  . Pneumonia vaccines (2 of 2 - PPSV23) 12/01/2014  . Tetanus Vaccine  07/28/2019  . Flu Shot  Completed  . DEXA scan (bone density measurement)  Completed  . Shingles Vaccine  Completed     Bone Densitometry Introduction Bone densitometry is an imaging test that uses a special X-ray to measure the amount of calcium and other minerals in your bones (bone density). This test is also known as a bone mineral density test or dual-energy X-ray absorptiometry (DXA). The test can measure bone density at your hip and your spine. It is similar to having a regular X-ray. You may have this test to:  Diagnose a condition that causes weak or thin bones (osteoporosis).  Predict your risk of a broken bone (fracture).  Determine how well osteoporosis treatment is working. Tell a health care provider about:  Any allergies you have.  All medicines you are taking, including vitamins, herbs, eye drops, creams, and over-the-counter medicines.  Any problems you or family members have had with anesthetic medicines.  Any blood disorders you have.  Any surgeries you have had.  Any medical conditions you have.  Possibility of pregnancy.  Any other medical test you had within the previous 14 days that used contrast material. What are the risks? Generally, this is a safe procedure. However, problems can occur and may include the  following:  This test exposes you to a very small amount of radiation.  The risks of radiation exposure may be greater to unborn children. What happens before the procedure?  Do not take any calcium supplements for 24 hours before having the test. You can otherwise eat and drink what you usually do.  Take off all metal jewelry, eyeglasses, dental appliances, and any other metal objects. What happens during the procedure?  You may lie on an exam table. There will be an X-ray generator below you and an imaging device above you.  Other devices, such as boxes or braces, may be used to position your body properly for the scan.  You will need to lie still while the machine slowly scans your body.  The images will show up on a computer monitor. What happens after the procedure? You may need more testing at a later time. This information is not intended to replace advice given to you by your health care provider. Make sure you discuss any questions you have with your health care provider. Document Released: 08/06/2004 Document Revised: 12/21/2015 Document Reviewed: 12/23/2013  2017 [employer]   Fall Prevention in the Home Introduction Falls can cause injuries. They can happen to people of all ages. There are many things you can do to make your home safe and to help prevent falls. What can I do on the outside of my home?  Regularly fix  the edges of walkways and driveways and fix any cracks.  Remove anything that might make you trip as you walk through a door, such as a raised step or threshold.  Trim any bushes or trees on the path to your home.  Use bright outdoor lighting.  Clear any walking paths of anything that might make someone trip, such as rocks or tools.  Regularly check to see if handrails are loose or broken. Make sure that both sides of any steps have handrails.  Any raised decks and porches should have guardrails on the edges.  Have any leaves, snow, or ice cleared  regularly.  Use sand or salt on walking paths during winter.  Clean up any spills in your garage right away. This includes oil or grease spills. What can I do in the bathroom?  Use night lights.  Install grab bars by the toilet and in the tub and shower. Do not use towel bars as grab bars.  Use non-skid mats or decals in the tub or shower.  If you need to sit down in the shower, use a plastic, non-slip stool.  Keep the floor dry. Clean up any water that spills on the floor as soon as it happens.  Remove soap buildup in the tub or shower regularly.  Attach bath mats securely with double-sided non-slip rug tape.  Do not have throw rugs and other things on the floor that can make you trip. What can I do in the bedroom?  Use night lights.  Make sure that you have a light by your bed that is easy to reach.  Do not use any sheets or blankets that are too big for your bed. They should not hang down onto the floor.  Have a firm chair that has side arms. You can use this for support while you get dressed.  Do not have throw rugs and other things on the floor that can make you trip. What can I do in the kitchen?  Clean up any spills right away.  Avoid walking on wet floors.  Keep items that you use a lot in easy-to-reach places.  If you need to reach something above you, use a strong step stool that has a grab bar.  Keep electrical cords out of the way.  Do not use floor polish or wax that makes floors slippery. If you must use wax, use non-skid floor wax.  Do not have throw rugs and other things on the floor that can make you trip. What can I do with my stairs?  Do not leave any items on the stairs.  Make sure that there are handrails on both sides of the stairs and use them. Fix handrails that are broken or loose. Make sure that handrails are as long as the stairways.  Check any carpeting to make sure that it is firmly attached to the stairs. Fix any carpet that is loose  or worn.  Avoid having throw rugs at the top or bottom of the stairs. If you do have throw rugs, attach them to the floor with carpet tape.  Make sure that you have a light switch at the top of the stairs and the bottom of the stairs. If you do not have them, ask someone to add them for you. What else can I do to help prevent falls?  Wear shoes that:  Do not have high heels.  Have rubber bottoms.  Are comfortable and fit you well.  Are closed at the toe.  Do not wear sandals.  If you use a stepladder:  Make sure that it is fully opened. Do not climb a closed stepladder.  Make sure that both sides of the stepladder are locked into place.  Ask someone to hold it for you, if possible.  Clearly mark and make sure that you can see:  Any grab bars or handrails.  First and last steps.  Where the edge of each step is.  Use tools that help you move around (mobility aids) if they are needed. These include:  Canes.  Walkers.  Scooters.  Crutches.  Turn on the lights when you go into a dark area. Replace any light bulbs as soon as they burn out.  Set up your furniture so you have a clear path. Avoid moving your furniture around.  If any of your floors are uneven, fix them.  If there are any pets around you, be aware of where they are.  Review your medicines with your doctor. Some medicines can make you feel dizzy. This can increase your chance of falling. Ask your doctor what other things that you can do to help prevent falls. This information is not intended to replace advice given to you by your health care provider. Make sure you discuss any questions you have with your health care provider. Document Released: 05/11/2009 Document Revised: 12/21/2015 Document Reviewed: 08/19/2014  2017 [employer]  Health Maintenance, Female Introduction Adopting a healthy lifestyle and getting preventive care can go a long way to promote health and wellness. Talk with your health care  provider about what schedule of regular examinations is right for you. This is a good chance for you to check in with your provider about disease prevention and staying healthy. In between checkups, there are plenty of things you can do on your own. Experts have done a lot of research about which lifestyle changes and preventive measures are most likely to keep you healthy. Ask your health care provider for more information. Weight and diet Eat a healthy diet  Be sure to include plenty of vegetables, fruits, low-fat dairy products, and lean protein.  Do not eat a lot of foods high in solid fats, added sugars, or salt.  Get regular exercise. This is one of the most important things you can do for your health.  Most adults should exercise for at least 150 minutes each week. The exercise should increase your heart rate and make you sweat (moderate-intensity exercise).  Most adults should also do strengthening exercises at least twice a week. This is in addition to the moderate-intensity exercise. Maintain a healthy weight  Body mass index (BMI) is a measurement that can be used to identify possible weight problems. It estimates body fat based on height and weight. Your health care provider can help determine your BMI and help you achieve or maintain a healthy weight.  For females 20 years of age and older:  A BMI below 18.5 is considered underweight.  A BMI of 18.5 to 24.9 is normal.  A BMI of 25 to 29.9 is considered overweight.  A BMI of 30 and above is considered obese. Watch levels of cholesterol and blood lipids  You should start having your blood tested for lipids and cholesterol at 81 years of age, then have this test every 5 years.  You may need to have your cholesterol levels checked more often if:  Your lipid or cholesterol levels are high.  You are older than 81 years of age.  You are  at high risk for heart disease. Cancer screening Lung Cancer  Lung cancer screening  is recommended for adults 6-28 years old who are at high risk for lung cancer because of a history of smoking.  A yearly low-dose CT scan of the lungs is recommended for people who:  Currently smoke.  Have quit within the past 15 years.  Have at least a 30-pack-year history of smoking. A pack year is smoking an average of one pack of cigarettes a day for 1 year.  Yearly screening should continue until it has been 15 years since you quit.  Yearly screening should stop if you develop a health problem that would prevent you from having lung cancer treatment. Breast Cancer  Practice breast self-awareness. This means understanding how your breasts normally appear and feel.  It also means doing regular breast self-exams. Let your health care provider know about any changes, no matter how small.  If you are in your 20s or 30s, you should have a clinical breast exam (CBE) by a health care provider every 1-3 years as part of a regular health exam.  If you are 9 or older, have a CBE every year. Also consider having a breast X-ray (mammogram) every year.  If you have a family history of breast cancer, talk to your health care provider about genetic screening.  If you are at high risk for breast cancer, talk to your health care provider about having an MRI and a mammogram every year.  Breast cancer gene (BRCA) assessment is recommended for women who have family members with BRCA-related cancers. BRCA-related cancers include:  Breast.  Ovarian.  Tubal.  Peritoneal cancers.  Results of the assessment will determine the need for genetic counseling and BRCA1 and BRCA2 testing. Cervical Cancer  Your health care provider may recommend that you be screened regularly for cancer of the pelvic organs (ovaries, uterus, and vagina). This screening involves a pelvic examination, including checking for microscopic changes to the surface of your cervix (Pap test). You may be encouraged to have this  screening done every 3 years, beginning at age 94.  For women ages 74-65, health care providers may recommend pelvic exams and Pap testing every 3 years, or they may recommend the Pap and pelvic exam, combined with testing for human papilloma virus (HPV), every 5 years. Some types of HPV increase your risk of cervical cancer. Testing for HPV may also be done on women of any age with unclear Pap test results.  Other health care providers may not recommend any screening for nonpregnant women who are considered low risk for pelvic cancer and who do not have symptoms. Ask your health care provider if a screening pelvic exam is right for you.  If you have had past treatment for cervical cancer or a condition that could lead to cancer, you need Pap tests and screening for cancer for at least 20 years after your treatment. If Pap tests have been discontinued, your risk factors (such as having a new sexual partner) need to be reassessed to determine if screening should resume. Some women have medical problems that increase the chance of getting cervical cancer. In these cases, your health care provider may recommend more frequent screening and Pap tests. Colorectal Cancer  This type of cancer can be detected and often prevented.  Routine colorectal cancer screening usually begins at 81 years of age and continues through 81 years of age.  Your health care provider may recommend screening at an earlier age if  you have risk factors for colon cancer.  Your health care provider may also recommend using home test kits to check for hidden blood in the stool.  A small camera at the end of a tube can be used to examine your colon directly (sigmoidoscopy or colonoscopy). This is done to check for the earliest forms of colorectal cancer.  Routine screening usually begins at age 57.  Direct examination of the colon should be repeated every 5-10 years through 81 years of age. However, you may need to be screened  more often if early forms of precancerous polyps or small growths are found. Skin Cancer  Check your skin from head to toe regularly.  Tell your health care provider about any new moles or changes in moles, especially if there is a change in a mole's shape or color.  Also tell your health care provider if you have a mole that is larger than the size of a pencil eraser.  Always use sunscreen. Apply sunscreen liberally and repeatedly throughout the day.  Protect yourself by wearing long sleeves, pants, a wide-brimmed hat, and sunglasses whenever you are outside. Heart disease, diabetes, and high blood pressure  High blood pressure causes heart disease and increases the risk of stroke. High blood pressure is more likely to develop in:  People who have blood pressure in the high end of the normal range (130-139/85-89 mm Hg).  People who are overweight or obese.  People who are African American.  If you are 33-6 years of age, have your blood pressure checked every 3-5 years. If you are 72 years of age or older, have your blood pressure checked every year. You should have your blood pressure measured twice-once when you are at a hospital or clinic, and once when you are not at a hospital or clinic. Record the average of the two measurements. To check your blood pressure when you are not at a hospital or clinic, you can use:  An automated blood pressure machine at a pharmacy.  A home blood pressure monitor.  If you are between 28 years and 38 years old, ask your health care provider if you should take aspirin to prevent strokes.  Have regular diabetes screenings. This involves taking a blood sample to check your fasting blood sugar level.  If you are at a normal weight and have a low risk for diabetes, have this test once every three years after 81 years of age.  If you are overweight and have a high risk for diabetes, consider being tested at a younger age or more often. Preventing  infection Hepatitis B  If you have a higher risk for hepatitis B, you should be screened for this virus. You are considered at high risk for hepatitis B if:  You were born in a country where hepatitis B is common. Ask your health care provider which countries are considered high risk.  Your parents were born in a high-risk country, and you have not been immunized against hepatitis B (hepatitis B vaccine).  You have HIV or AIDS.  You use needles to inject street drugs.  You live with someone who has hepatitis B.  You have had sex with someone who has hepatitis B.  You get hemodialysis treatment.  You take certain medicines for conditions, including cancer, organ transplantation, and autoimmune conditions. Hepatitis C  Blood testing is recommended for:  Everyone born from [birth year] through 1963.  Anyone with known risk factors for hepatitis C. Sexually transmitted infections (STIs)  You should be screened for sexually transmitted infections (STIs) including gonorrhea and chlamydia if:  You are sexually active and are younger than 81 years of age.  You are older than 81 years of age and your health care provider tells you that you are at risk for this type of infection.  Your sexual activity has changed since you were last screened and you are at an increased risk for chlamydia or gonorrhea. Ask your health care provider if you are at risk.  If you do not have HIV, but are at risk, it may be recommended that you take a prescription medicine daily to prevent HIV infection. This is called pre-exposure prophylaxis (PrEP). You are considered at risk if:  You are sexually active and do not regularly use condoms or know the HIV status of your partner(s).  You take drugs by injection.  You are sexually active with a partner who has HIV. Talk with your health care provider about whether you are at high risk of being infected with HIV. If you choose to begin PrEP, you should first be  tested for HIV. You should then be tested every 3 months for as long as you are taking PrEP. Pregnancy  If you are premenopausal and you may become pregnant, ask your health care provider about preconception counseling.  If you may become pregnant, take 400 to 800 micrograms (mcg) of folic acid every day.  If you want to prevent pregnancy, talk to your health care provider about birth control (contraception). Osteoporosis and menopause  Osteoporosis is a disease in which the bones lose minerals and strength with aging. This can result in serious bone fractures. Your risk for osteoporosis can be identified using a bone density scan.  If you are 3 years of age or older, or if you are at risk for osteoporosis and fractures, ask your health care provider if you should be screened.  Ask your health care provider whether you should take a calcium or vitamin D supplement to lower your risk for osteoporosis.  Menopause may have certain physical symptoms and risks.  Hormone replacement therapy may reduce some of these symptoms and risks. Talk to your health care provider about whether hormone replacement therapy is right for you. Follow these instructions at home:  Schedule regular health, dental, and eye exams.  Stay current with your immunizations.  Do not use any tobacco products including cigarettes, chewing tobacco, or electronic cigarettes.  If you are pregnant, do not drink alcohol.  If you are breastfeeding, limit how much and how often you drink alcohol.  Limit alcohol intake to no more than 1 drink per day for nonpregnant women. One drink equals 12 ounces of beer, 5 ounces of wine, or 1 ounces of hard liquor.  Do not use street drugs.  Do not share needles.  Ask your health care provider for help if you need support or information about quitting drugs.  Tell your health care provider if you often feel depressed.  Tell your health care provider if you have ever been abused  or do not feel safe at home. This information is not intended to replace advice given to you by your health care provider. Make sure you discuss any questions you have with your health care provider. Document Released: 01/28/2011 Document Revised: 12/21/2015 Document Reviewed: 04/18/2015  2017 [employer]

## 2016-08-08 NOTE — Progress Notes (Addendum)
Subjective:   Emily Cannon is a 83 y.o. female who presents for Medicare Annual (Subsequent) preventive examination.  The Patient was informed that the wellness visit is to identify future health risk and educate and initiate measures that can reduce risk for increased disease through the lifespan.    NO ROS; Medicare Wellness Visit/  Married and spouse was in Event organiser  Married x 42 years      Describes health as good, fair or great? Recent episode of syncope during work out at gym on [employer].   States she has issues relaxing as she stays very busy.  She likes to walk; did walk 2 miles a day  Also was told she may like massage and may try this  She is excited about restarting her exercise program   The following written screening schedule of preventive measures were reviewed with assessment and plan made per below and patient instructions:  Smoking history reviewed / never smoked  Use of Smokeless tobacco /no  Second Hand Smoke status; No Smokers in the home  ETOH; NO  RISK FACTORS  Lipids; coh 175; HDL 49; LDL 91; Trig 172 (glucose normal)   [facility]; doesn't like to sit  Has a lot of energy  [facility] and pick out some new things you like   Diet;  "Eat's right" Breakfast cereal Lunch; healthy food, greens;  Vegetables Supper; chicken   incorporates fruits and vegetables;   BMI wnl   Fall risk: presented mobile; get up and go is very good  Mobility of Functional changes this year? no  Safety at home and  Community reviewed; wears sunscreen if in the sun; Keeps firearms in a safe place if they exist Safe driving recommendations for older adults  Hearing Screening Comments: States she has her ears checked q 6 months Goes q 6 months Div of HOH Vision Screening Comments: Vision checks  Cataract surgery Dr. Dolores Lory;  Will find another md    Depression Screen neg PhQ 2:  negative  Activities of Daily Living - See functional screen   Cognitive testing; Ad8 score; 0 or less than 2  MMSE deferred or completed if AD8 + 2 issues  Advanced Directives reviewed for completion; YES  Patient Care Team: Eulas Post, MD as PCP - General (Family Medicine)   Preventives screens reviewed Colonoscopy 03/2013 ? Aged out Mammogram 07/2015 Dexa 10/2010 -0.9/ education provided      Immunization History  Administered Date(s) Administered  . Influenza Split 05/03/2011, 05/25/2012  . Influenza Whole 05/27/2006, 04/27/2008, 05/12/2009, 05/04/2010  . Influenza, High Dose Seasonal PF 06/08/2015, 04/30/2016  . Influenza,inj,Quad PF,36+ Mos 05/30/2014  . Influenza-Unspecified 04/28/2013  . Pneumococcal Conjugate-13 11/30/2013  . Pneumococcal Polysaccharide-23 04/28/2000, 08/08/2016  . Td 07/27/2009  . Zoster 10/28/2010   Required Immunizations needed today  Screening test up to date or reviewed for plan of completion Health Maintenance Due  Topic Date Due  . PNA vac Low Risk Adult (2 of 2 - PPSV23) 12/01/2014    Last PSV completed in October prior to 67[facility] BD in November;   Cardiac Risk Factors include: advanced age (>3men, >78 women)     Objective:     Vitals: BP 122/70   Pulse 69   Ht 5\' 1"  (1.549 m)   Wt 130 lb 8 oz (59.2 kg)   SpO2 97%   BMI 24.66 kg/m   Body mass index is 24.66  kg/m.  Tobacco History  Smoking Status  . Never Smoker  Smokeless Tobacco  . Never Used     Counseling given: Yes   Past Medical History:  Diagnosis Date  . Acquired absence of organ, genital organs   . Allergic rhinitis, cause unspecified   . Anxiety state, unspecified   . Disorder of bone and cartilage, unspecified   . Hypothyroid   . Irritable bowel syndrome   . Lumbago   . Other and unspecified hyperlipidemia   . Perforation of tympanic membrane, unspecified   . Unspecified essential hypertension    Past Surgical History:  Procedure  Laterality Date  . ABDOMINAL HYSTERECTOMY    . cataract surgery  5/12 and 12/24/2012   both eyes  . TONSILLECTOMY     Family History  Problem Relation Age of Onset  . Lung cancer Mother 66  . Lung disease Father 7  . COPD Other     sibling  . Colon cancer Neg Hx    History  Sexual Activity  . Sexual activity: Not on file    Outpatient Encounter Prescriptions as of 08/08/2016  Medication Sig  . ALPRAZolam (XANAX) 0.25 MG tablet TAKE ONE TABLET BY MOUTH TWICE A DAY AS NEEDED FOR ANXIETY  . amLODipine (NORVASC) 10 MG tablet TAKE 1 TABLET (10 MG TOTAL) BY MOUTH DAILY.  Marland Kitchen aspirin 81 MG tablet Take 81 mg by mouth every evening.   Marland Kitchen atorvastatin (LIPITOR) 20 MG tablet TAKE 1 TABLET (20 MG TOTAL) BY MOUTH DAILY.  Marland Kitchen diclofenac sodium (VOLTAREN) 1 % GEL Apply 2 g topically 4 (four) times daily.  . diphenhydramine-acetaminophen (TYLENOL PM) 25-500 MG TABS Take 1 tablet by mouth at bedtime as needed.    Marland Kitchen gemfibrozil (LOPID) 600 MG tablet TAKE 1 TABLET BY MOUTH DAILY.  . hydrALAZINE (APRESOLINE) 50 MG tablet TAKE 1 TABLET (50 MG TOTAL) BY MOUTH 2 (TWO) TIMES DAILY.  . hydrochlorothiazide (MICROZIDE) 12.5 MG capsule TAKE ONE CAPSULE (12.5 MG TOTAL) BY MOUTH DAILY.  Marland Kitchen levothyroxine (SYNTHROID, LEVOTHROID) 50 MCG tablet TAKE 1 TABLET (50 MCG TOTAL) BY MOUTH DAILY BEFORE BREAKFAST.  . metoprolol succinate (TOPROL-XL) 50 MG 24 hr tablet TAKE 1 TABLET (50 MG TOTAL) BY MOUTH 2 (TWO) TIMES DAILY.  Marland Kitchen spironolactone (ALDACTONE) 25 MG tablet TAKE 1 TABLET (25 MG TOTAL) BY MOUTH DAILY.   No facility-administered encounter medications on file as of 08/08/2016.     Activities of Daily Living In your present state of health, do you have any difficulty performing the following activities: 08/08/2016  Hearing? Y  Vision? N  Difficulty concentrating or making decisions? N  Walking or climbing stairs? N  Dressing or bathing? N  Doing errands, shopping? N  Preparing Food and eating ? N  Using the Toilet?  N  In the past six months, have you accidently leaked urine? N  Do you have problems with loss of bowel control? N  Managing your Medications? N  Managing your Finances? N  Housekeeping or managing your Housekeeping? N  Some recent data might be hidden    Patient Care Team: Eulas Post, MD as PCP - General (Family Medicine)    Assessment:     Exercise Activities and Dietary recommendations Current Exercise Habits: Home exercise routine;Structured exercise class, Time (Minutes): 60, Frequency (Times/Week): 3, Weekly Exercise (Minutes/Week): 180, Intensity: Moderate  Goals    . relaxation          Will consider massage and ways of reducing  Fall Risk Fall Risk  08/08/2016 06/08/2015 06/08/2015 05/30/2014 11/30/2013  Falls in the past year? Yes No No No No  Number falls in past yr: 1 - - - -  Follow up Education provided - - - -   Depression Screen PHQ 2/9 Scores 08/08/2016 06/08/2015 06/08/2015 05/30/2014  PHQ - 2 Score 0 0 0 0     Cognitive Function MMSE - Mini Mental State Exam 08/08/2016  Not completed: (No Data)        Immunization History  Administered Date(s) Administered  . Influenza Split 05/03/2011, 05/25/2012  . Influenza Whole 05/27/2006, 04/27/2008, 05/12/2009, 05/04/2010  . Influenza, High Dose Seasonal PF 06/08/2015, 04/30/2016  . Influenza,inj,Quad PF,36+ Mos 05/30/2014  . Influenza-Unspecified 04/28/2013  . Pneumococcal Conjugate-13 11/30/2013  . Pneumococcal Polysaccharide-23 04/28/2000, 08/08/2016  . Td 07/27/2009  . Zoster 10/28/2010   Screening Tests Health Maintenance  Topic Date Due  . PNA vac Low Risk Adult (2 of 2 - PPSV23) 12/01/2014  . TETANUS/TDAP  07/28/2019  . INFLUENZA VACCINE  Completed  . DEXA SCAN  Completed  . ZOSTAVAX  Completed      Plan:   Dexa in 2012; -0.9; Educated provided on calcium 1200mg ; vit d 1000 and continued weight bearing exercise.  Agreed to take her last PSV 23 today  Preventive health no up  to date  During the course of the visit the patient was educated and counseled about the following appropriate screening and preventive services:   Vaccines to include Pneumoccal, Influenza, Hepatitis B, Td, Zostavax, HCV  Electrocardiogram  Cardiovascular Disease  Colorectal cancer screening 03/2013; aged out  Bone density screening/ 2012; avid exerciser;   Diabetes screening neg  Glaucoma screening   Mammography/ she is scheduling her annual mammogram  Nutrition counseling   Patient Instructions (the written plan) was given to the patient.   Wynetta Fines, RN  08/08/2016   Agree with notes/assessment above as per Wynetta Fines, RN  Eulas Post MD [facility] to Dr. Maudie Mercury to review and sign per coding  Lucretia Kern., DO

## 2016-08-20 ENCOUNTER — Other Ambulatory Visit: Payer: Self-pay | Admitting: Family Medicine

## 2016-08-20 DIAGNOSIS — Z1231 Encounter for screening mammogram for malignant neoplasm of breast: Secondary | ICD-10-CM | POA: Diagnosis not present

## 2016-08-20 LAB — HM MAMMOGRAPHY: HM MAMMO: NORMAL (ref 0–4)

## 2016-08-29 ENCOUNTER — Other Ambulatory Visit: Payer: Self-pay | Admitting: Family Medicine

## 2016-09-09 NOTE — Progress Notes (Signed)
HPI: FU syncope. Echocardiogram May 2011 showed normal LV function. Renal Dopplers December 2015 normal. Carotid dopplers 11/17 normal. Stress echo 12/17 with no ischemia and normal LV function. Previous episode felt possibly neurocardiogenic. Since last seen, the patient denies any dyspnea on exertion, orthopnea, PND, pedal edema, palpitations, syncope or chest pain.   Current Outpatient Prescriptions  Medication Sig Dispense Refill  . ALPRAZolam (XANAX) 0.25 MG tablet TAKE ONE TABLET BY MOUTH TWICE A DAY AS NEEDED FOR ANXIETY 90 tablet 1  . amLODipine (NORVASC) 10 MG tablet TAKE 1 TABLET (10 MG TOTAL) BY MOUTH DAILY. 90 tablet 4  . aspirin 81 MG tablet Take 81 mg by mouth every evening.     Marland Kitchen atorvastatin (LIPITOR) 20 MG tablet TAKE 1 TABLET (20 MG TOTAL) BY MOUTH DAILY. 30 tablet 10  . diclofenac sodium (VOLTAREN) 1 % GEL Apply 2 g topically 4 (four) times daily. 100 g 1  . diphenhydramine-acetaminophen (TYLENOL PM) 25-500 MG TABS Take 1 tablet by mouth at bedtime as needed.      Marland Kitchen gemfibrozil (LOPID) 600 MG tablet TAKE 1 TABLET BY MOUTH DAILY. 30 tablet 10  . hydrALAZINE (APRESOLINE) 50 MG tablet TAKE 1 TABLET (50 MG TOTAL) BY MOUTH 2 (TWO) TIMES DAILY. 60 tablet 10  . hydrochlorothiazide (MICROZIDE) 12.5 MG capsule TAKE ONE CAPSULE (12.5 MG TOTAL) BY MOUTH DAILY. 30 capsule 11  . levothyroxine (SYNTHROID, LEVOTHROID) 50 MCG tablet TAKE 1 TABLET (50 MCG TOTAL) BY MOUTH DAILY BEFORE BREAKFAST. 30 tablet 5  . metoprolol succinate (TOPROL-XL) 50 MG 24 hr tablet TAKE 1 TABLET (50 MG TOTAL) BY MOUTH 2 (TWO) TIMES DAILY. 60 tablet 11  . spironolactone (ALDACTONE) 25 MG tablet TAKE 1 TABLET (25 MG TOTAL) BY MOUTH DAILY. 30 tablet 5   No current facility-administered medications for this visit.      Past Medical History:  Diagnosis Date  . Acquired absence of organ, genital organs   . Allergic rhinitis, cause unspecified   . Anxiety state, unspecified   . Disorder of bone and  cartilage, unspecified   . Hypothyroid   . Irritable bowel syndrome   . Lumbago   . Other and unspecified hyperlipidemia   . Perforation of tympanic membrane, unspecified   . Unspecified essential hypertension     Past Surgical History:  Procedure Laterality Date  . ABDOMINAL HYSTERECTOMY    . cataract surgery  5/12 and 12/24/2012   both eyes  . TONSILLECTOMY      Social History   Social History  . Marital status: Married    Spouse name: Jeneen Rinks  . Number of children: 2  . Years of education: N/A   Occupational History  . retired Research scientist (physical sciences)    Social History Main Topics  . Smoking status: Never Smoker  . Smokeless tobacco: Never Used  . Alcohol use No  . Drug use: No  . Sexual activity: Not on file   Other Topics Concern  . Not on file   Social History Narrative   Son is Doug Coble--pt here     Family History  Problem Relation Age of Onset  . Lung cancer Mother 54  . Lung disease Father 33  . COPD Other     sibling  . Colon cancer Neg Hx     ROS: no fevers or chills, productive cough, hemoptysis, dysphasia, odynophagia, melena, hematochezia, dysuria, hematuria, rash, seizure activity, orthopnea, PND, pedal edema, claudication. Remaining systems are negative.  Physical Exam: Well-developed well-nourished in no  acute distress.  Skin is warm and dry.  HEENT is normal.  Neck is supple. No bruits Chest is clear to auscultation with normal expansion.  Cardiovascular exam is regular rate and rhythm.  Abdominal exam nontender or distended. No masses palpated. Extremities show no edema. neuro grossly intact  A/P  1 Syncope-no recurrent episodes. No further evaluation unless recurrence.  2 hypertension-blood pressure controlled. Continue present medications.  3 hyperlipidemia-continue statin.  Kirk Ruths, MD

## 2016-09-18 ENCOUNTER — Ambulatory Visit (INDEPENDENT_AMBULATORY_CARE_PROVIDER_SITE_OTHER): Payer: Medicare Other | Admitting: Cardiology

## 2016-09-18 ENCOUNTER — Encounter: Payer: Self-pay | Admitting: Cardiology

## 2016-09-18 VITALS — BP 132/58 | HR 64 | Ht 61.0 in | Wt 131.0 lb

## 2016-09-18 DIAGNOSIS — R55 Syncope and collapse: Secondary | ICD-10-CM

## 2016-09-18 DIAGNOSIS — I10 Essential (primary) hypertension: Secondary | ICD-10-CM | POA: Diagnosis not present

## 2016-09-18 DIAGNOSIS — E7800 Pure hypercholesterolemia, unspecified: Secondary | ICD-10-CM | POA: Diagnosis not present

## 2016-09-18 NOTE — Patient Instructions (Signed)
Your physician recommends that you schedule a follow-up appointment in: AS NEEDED  

## 2016-10-07 DIAGNOSIS — H6123 Impacted cerumen, bilateral: Secondary | ICD-10-CM | POA: Diagnosis not present

## 2016-10-07 DIAGNOSIS — H903 Sensorineural hearing loss, bilateral: Secondary | ICD-10-CM | POA: Diagnosis not present

## 2016-10-07 DIAGNOSIS — H61303 Acquired stenosis of external ear canal, unspecified, bilateral: Secondary | ICD-10-CM | POA: Diagnosis not present

## 2016-10-26 ENCOUNTER — Other Ambulatory Visit: Payer: Self-pay | Admitting: Family Medicine

## 2016-10-28 NOTE — Telephone Encounter (Signed)
Refill once and set up office visit within 1-2 months to discuss.

## 2016-10-28 NOTE — Telephone Encounter (Signed)
Last refill 09/11/16.  Last office visit 06/05/16.  Okay to refill?

## 2016-11-01 ENCOUNTER — Other Ambulatory Visit: Payer: Self-pay | Admitting: Family Medicine

## 2016-12-04 ENCOUNTER — Encounter: Payer: Self-pay | Admitting: Family Medicine

## 2016-12-04 ENCOUNTER — Ambulatory Visit (INDEPENDENT_AMBULATORY_CARE_PROVIDER_SITE_OTHER): Payer: Medicare Other | Admitting: Family Medicine

## 2016-12-04 VITALS — BP 130/70 | HR 75 | Temp 98.1°F | Wt 130.5 lb

## 2016-12-04 DIAGNOSIS — I10 Essential (primary) hypertension: Secondary | ICD-10-CM | POA: Diagnosis not present

## 2016-12-04 DIAGNOSIS — F411 Generalized anxiety disorder: Secondary | ICD-10-CM

## 2016-12-04 DIAGNOSIS — E039 Hypothyroidism, unspecified: Secondary | ICD-10-CM

## 2016-12-04 DIAGNOSIS — E7800 Pure hypercholesterolemia, unspecified: Secondary | ICD-10-CM

## 2016-12-04 LAB — BASIC METABOLIC PANEL
BUN: 16 mg/dL (ref 6–23)
CALCIUM: 10.1 mg/dL (ref 8.4–10.5)
CHLORIDE: 100 meq/L (ref 96–112)
CO2: 30 meq/L (ref 19–32)
CREATININE: 0.76 mg/dL (ref 0.40–1.20)
GFR: 77.53 mL/min (ref >60.00)
GLUCOSE: 105 mg/dL — AB (ref 70–99)
Potassium: 4.1 mEq/L (ref 3.5–5.1)
Sodium: 137 mEq/L (ref 135–145)

## 2016-12-04 LAB — LIPID PANEL
CHOL/HDL RATIO: 3
Cholesterol: 176 mg/dL (ref 0–200)
HDL: 53.2 mg/dL (ref >39.00)
LDL CALC: 85 mg/dL (ref 0–99)
NONHDL: 122.34
Triglycerides: 188 mg/dL — ABNORMAL HIGH (ref 0.0–149.0)
VLDL: 37.6 mg/dL (ref 0.0–40.0)

## 2016-12-04 LAB — HEPATIC FUNCTION PANEL
ALBUMIN: 4.9 g/dL (ref 3.5–5.2)
ALK PHOS: 65 U/L (ref 39–117)
ALT: 15 U/L (ref 0–35)
AST: 17 U/L (ref 0–37)
BILIRUBIN DIRECT: 0.1 mg/dL (ref 0.0–0.3)
Total Bilirubin: 0.5 mg/dL (ref 0.2–1.2)
Total Protein: 7.3 g/dL (ref 6.0–8.3)

## 2016-12-04 NOTE — Progress Notes (Signed)
Subjective:     Patient ID: Emily Cannon, female   DOB: 1932/09/26, 83 y.o.   MRN: [id number]  HPI Patient seen for medical follow-up. Her chronic problems include history of hyperlipidemia, chronic insomnia, hypertension, irritable bowel syndrome, hypothyroidism. Medications reviewed. She takes multi drug regimen for hypertension. She walks regularly for exercise. No headaches or dizziness. No recent chest pains. She is taken very low-dose alprazolam at night for insomnia for years. We brought her back today to discuss idea of trying to taper off this.  She does not use any alcohol at night. Tries to avoid computers but does frequently watch TV at night.  Past Medical History:  Diagnosis Date  . Acquired absence of organ, genital organs   . Allergic rhinitis, cause unspecified   . Anxiety state, unspecified   . Disorder of bone and cartilage, unspecified   . Hypothyroid   . Irritable bowel syndrome   . Lumbago   . Other and unspecified hyperlipidemia   . Perforation of tympanic membrane, unspecified   . Unspecified essential hypertension    Past Surgical History:  Procedure Laterality Date  . ABDOMINAL HYSTERECTOMY    . cataract surgery  5/12 and 12/24/2012   both eyes  . TONSILLECTOMY      reports that she has never smoked. She has never used smokeless tobacco. She reports that she does not drink alcohol or use drugs. family history includes COPD in her other; Lung cancer (age of onset: 76) in her mother; Lung disease (age of onset: 24) in her father. Allergies  Allergen Reactions  . Alendronate Sodium     REACTION: pt states "short of breath"  . Amoxicillin     REACTION: causes severe diarrhea  . Codeine     REACTION: pt states nausea  . Valsartan     REACTION: gums/throat swelling     Review of Systems  Constitutional: Negative for appetite change, fatigue and unexpected weight change.  Eyes: Negative for visual disturbance.  Respiratory: Negative for cough, chest  tightness, shortness of breath and wheezing.   Cardiovascular: Negative for chest pain, palpitations and leg swelling.  Endocrine: Negative for polydipsia and polyuria.  Genitourinary: Negative for dysuria.  Neurological: Negative for dizziness, seizures, syncope, weakness, light-headedness and headaches.       Objective:   Physical Exam  Constitutional: She appears well-developed and well-nourished.  Eyes: Pupils are equal, round, and reactive to light.  Neck: Neck supple. No JVD present. No thyromegaly present.  Cardiovascular: Normal rate and regular rhythm.  Exam reveals no gallop.   Pulmonary/Chest: Effort normal and breath sounds normal. No respiratory distress. She has no wheezes. She has no rales.  Musculoskeletal: She exhibits no edema.  Neurological: She is alert.       Assessment:     #1 hypertension. Probable white coat syndrome.  #2 dyslipidemia  #3 hypothyroidism  #4 chronic insomnia    Plan:     -Check labs with TSH, basic metabolic panel, hepatic, lipid panel -We discussed idea of trying to taper off alprazolam even though she takes only very low-dose of 0.25 mg at night -Sleep hygiene discussed -Routine follow-up in 6 months and sooner as needed  Eulas Post MD [facility]

## 2016-12-05 LAB — TSH: TSH: 1.63 u[IU]/mL (ref 0.35–4.50)

## 2016-12-11 ENCOUNTER — Other Ambulatory Visit: Payer: Self-pay | Admitting: Family Medicine

## 2016-12-11 NOTE — Telephone Encounter (Signed)
Refilled for #90 early April. Should not be due yet-especially if taking just at night.  We discussed trying to taper off.

## 2016-12-11 NOTE — Telephone Encounter (Signed)
Last refill 10/28/16 and last office visit 12/04/16. We discussed idea of trying to taper off alprazolam even though she takes only very low-dose of 0.25 mg at night Okay to fill?

## 2016-12-20 ENCOUNTER — Other Ambulatory Visit: Payer: Self-pay | Admitting: Family Medicine

## 2017-01-26 ENCOUNTER — Other Ambulatory Visit: Payer: Self-pay | Admitting: Family Medicine

## 2017-02-01 ENCOUNTER — Other Ambulatory Visit: Payer: Self-pay | Admitting: Family Medicine

## 2017-02-10 ENCOUNTER — Other Ambulatory Visit: Payer: Self-pay | Admitting: Family Medicine

## 2017-02-20 ENCOUNTER — Other Ambulatory Visit: Payer: Self-pay | Admitting: Family Medicine

## 2017-04-05 ENCOUNTER — Other Ambulatory Visit: Payer: Self-pay | Admitting: Family Medicine

## 2017-04-09 DIAGNOSIS — H903 Sensorineural hearing loss, bilateral: Secondary | ICD-10-CM | POA: Diagnosis not present

## 2017-04-09 DIAGNOSIS — H6123 Impacted cerumen, bilateral: Secondary | ICD-10-CM | POA: Diagnosis not present

## 2017-04-09 DIAGNOSIS — H61303 Acquired stenosis of external ear canal, unspecified, bilateral: Secondary | ICD-10-CM | POA: Diagnosis not present

## 2017-04-23 DIAGNOSIS — H02834 Dermatochalasis of left upper eyelid: Secondary | ICD-10-CM | POA: Diagnosis not present

## 2017-04-23 DIAGNOSIS — H43813 Vitreous degeneration, bilateral: Secondary | ICD-10-CM | POA: Diagnosis not present

## 2017-04-23 DIAGNOSIS — Z961 Presence of intraocular lens: Secondary | ICD-10-CM | POA: Diagnosis not present

## 2017-04-23 DIAGNOSIS — H02831 Dermatochalasis of right upper eyelid: Secondary | ICD-10-CM | POA: Diagnosis not present

## 2017-04-23 DIAGNOSIS — H04123 Dry eye syndrome of bilateral lacrimal glands: Secondary | ICD-10-CM | POA: Diagnosis not present

## 2017-05-05 ENCOUNTER — Ambulatory Visit (INDEPENDENT_AMBULATORY_CARE_PROVIDER_SITE_OTHER): Payer: Medicare Other | Admitting: *Deleted

## 2017-05-05 DIAGNOSIS — Z23 Encounter for immunization: Secondary | ICD-10-CM | POA: Diagnosis not present

## 2017-05-08 ENCOUNTER — Other Ambulatory Visit: Payer: Self-pay | Admitting: Family Medicine

## 2017-06-06 ENCOUNTER — Encounter: Payer: Medicare Other | Admitting: Family Medicine

## 2017-06-27 ENCOUNTER — Ambulatory Visit: Payer: Medicare Other | Admitting: Family Medicine

## 2017-06-27 ENCOUNTER — Encounter: Payer: Self-pay | Admitting: Family Medicine

## 2017-06-27 VITALS — BP 120/70 | HR 70 | Temp 98.3°F | Ht 61.0 in | Wt 129.1 lb

## 2017-06-27 DIAGNOSIS — I10 Essential (primary) hypertension: Secondary | ICD-10-CM | POA: Diagnosis not present

## 2017-06-27 DIAGNOSIS — Z0000 Encounter for general adult medical examination without abnormal findings: Secondary | ICD-10-CM

## 2017-06-27 LAB — BASIC METABOLIC PANEL
BUN: 16 mg/dL (ref 6–23)
CALCIUM: 10.4 mg/dL (ref 8.4–10.5)
CO2: 28 meq/L (ref 19–32)
CREATININE: 0.81 mg/dL (ref 0.40–1.20)
Chloride: 101 mEq/L (ref 96–112)
GFR: 71.94 mL/min (ref >60.00)
GLUCOSE: 110 mg/dL — AB (ref 70–99)
Potassium: 4 mEq/L (ref 3.5–5.1)
Sodium: 137 mEq/L (ref 135–145)

## 2017-06-27 MED ORDER — ALPRAZOLAM 0.25 MG PO TABS: ORAL_TABLET | ORAL | 0 refills | Status: DC

## 2017-06-27 NOTE — Progress Notes (Signed)
Subjective:     Patient ID: Emily Cannon, female   DOB: 07/10/1933, 83 y.o.   MRN: [id number]  HPI Patient seen for physical exam. She has history of hypertension, irritable bowel syndrome, hypothyroidism, osteopenia, hyperlipidemia, chronic insomnia. She has reduced her alprazolam down to just 0.25 mg daily at bedtime. We encouraged her several months ago to try to reduce this. She's had challenging blood pressure to control in the past but seems to be stable this point. Medications reviewed and compliant with all.  She still gets yearly mammograms. She walks for exercise. Immunizations are up-to-date. Would not get any further colonoscopies. Never smoked.  Past Medical History:  Diagnosis Date  . Acquired absence of organ, genital organs   . Allergic rhinitis, cause unspecified   . Anxiety state, unspecified   . Disorder of bone and cartilage, unspecified   . Hypothyroid   . Irritable bowel syndrome   . Lumbago   . Other and unspecified hyperlipidemia   . Perforation of tympanic membrane, unspecified   . Unspecified essential hypertension    Past Surgical History:  Procedure Laterality Date  . ABDOMINAL HYSTERECTOMY    . cataract surgery  5/12 and 12/24/2012   both eyes  . TONSILLECTOMY      reports that  has never smoked. she has never used smokeless tobacco. She reports that she does not drink alcohol or use drugs. family history includes COPD in her other; Lung cancer (age of onset: 98) in her mother; Lung disease (age of onset: 35) in her father. Allergies  Allergen Reactions  . Alendronate Sodium     REACTION: pt states "short of breath"  . Amoxicillin     REACTION: causes severe diarrhea  . Codeine     REACTION: pt states nausea  . Valsartan     REACTION: gums/throat swelling     Review of Systems  Constitutional: Negative for activity change, appetite change, fatigue, fever and unexpected weight change.  HENT: Negative for ear pain, hearing loss, sore throat  and trouble swallowing.   Eyes: Negative for visual disturbance.  Respiratory: Negative for cough and shortness of breath.   Cardiovascular: Negative for chest pain and palpitations.  Gastrointestinal: Negative for abdominal pain, blood in stool, constipation and diarrhea.  Endocrine: Negative for polydipsia and polyuria.  Genitourinary: Negative for dysuria and hematuria.  Musculoskeletal: Negative for arthralgias, back pain and myalgias.  Skin: Negative for rash.  Neurological: Negative for dizziness, syncope and headaches.  Hematological: Negative for adenopathy.  Psychiatric/Behavioral: Negative for confusion and dysphoric mood.       Objective:   Physical Exam  Constitutional: She is oriented to person, place, and time. She appears well-developed and well-nourished.  HENT:  Right Ear: External ear normal.  Left Ear: External ear normal.  Mouth/Throat: Oropharynx is clear and moist. No oropharyngeal exudate.  Eyes: Pupils are equal, round, and reactive to light.  Neck: Neck supple. No JVD present. No thyromegaly present.  Cardiovascular: Normal rate and regular rhythm. Exam reveals no gallop.  Pulmonary/Chest: Effort normal and breath sounds normal. No respiratory distress. She has no wheezes. She has no rales.  Abdominal: Soft. She exhibits no mass. There is no tenderness. There is no rebound and no guarding.  Musculoskeletal: She exhibits no edema.  Neurological: She is alert and oriented to person, place, and time. No cranial nerve deficit.  Skin: No rash noted.  Psychiatric: She has a normal mood and affect. Her behavior is normal.  Assessment:     Physical exam.-Several issues addressed as below    Plan:     -Recheck basic metabolic panel -Immunizations reviewed and up-to-date -Continue with yearly mammogram -Continue with regular weightbearing exercise and over-the-counter vitamin D and calcium supplements -Routine follow-up in 6 months and sooner as  needed  Eulas Post MD  [facility] at [facility]

## 2017-07-19 ENCOUNTER — Other Ambulatory Visit: Payer: Self-pay | Admitting: Family Medicine

## 2017-07-30 ENCOUNTER — Other Ambulatory Visit: Payer: Self-pay | Admitting: Family Medicine

## 2017-09-18 DIAGNOSIS — R922 Inconclusive mammogram: Secondary | ICD-10-CM | POA: Diagnosis not present

## 2017-09-18 DIAGNOSIS — Z1231 Encounter for screening mammogram for malignant neoplasm of breast: Secondary | ICD-10-CM | POA: Diagnosis not present

## 2017-09-20 ENCOUNTER — Other Ambulatory Visit: Payer: Self-pay | Admitting: Family Medicine

## 2017-10-17 ENCOUNTER — Other Ambulatory Visit: Payer: Self-pay | Admitting: Family Medicine

## 2017-11-18 ENCOUNTER — Ambulatory Visit: Payer: Self-pay

## 2017-11-18 DIAGNOSIS — R531 Weakness: Secondary | ICD-10-CM | POA: Diagnosis not present

## 2017-11-18 NOTE — Telephone Encounter (Signed)
Pt's husband called to report that pt passed out for 15-25 seconds after coming back from exercising. Pt stated that she came inside to sit down and he heard her mumbling and he tapped her face to wake her up but he did not get a response. Pt's husband called EMS who triaged her at the scene. He stated that her VS were stable and a 12 lead EKG was normal. He stated that she was unconscious for 15-25 seconds then she was alert. Husband stated she had urine incontinence during event.  Pt is alert and orient to person, place and time. She said that she had not drank any water prior to walking and she feels that is why she passed out. Pt denies cardiac sx but stated that she is having some weakness. No GI sx. She stated she is having left leg numbness but stated, "it is always like that." Pt had similar episode 05/28/16. Cleophas Dunker at PCP office and gave her the above information. No appt available today. Sunday Spillers stated to use a same day appt tomorrow.  Instructed pt to take it easy the rest of the day and to drink water and drink juice. Instructed her to lie down with her feet elevated. Husband to drive her to appt tomorrow. Reason for Disposition . [1] Age > 50 years  AND [2] now alert and feels fine  Answer Assessment - Initial Assessment Questions 1. ONSET: "How long were you unconscious?" (minutes) "When did it happen?"     15 to 25 seconds 2. CONTENT: "What happened during period of unconsciousness?" (e.g., seizure activity)      Mumbling- no seizure actvity 3. MENTAL STATUS: "Alert and oriented now?" (oriented x 3 = name, month, location)      yes 4. TRIGGER: "What do you think caused the fainting?" "What were you doing just before you fainted?"  (e.g., exercise, sudden standing up, prolonged standing)     Not drinking enough water 5. RECURRENT SYMPTOM: "Have you ever passed out before?" If so, ask: "When was the last time?" and "What happened that time?"      Yes at the [facility] 6-8 months  ago-was exercising and suddenly passed out 6. INJURY: "Did you sustain any injury during the fall?"      no 7. CARDIAC SYMPTOMS: "Have you had any of the following symptoms: chest pain, difficulty breathing, palpitations?"     no 8. NEUROLOGIC SYMPTOMS: "Have you had any of the following symptoms: headache, numbness, vertigo, weakness?"     Weakness. 9. GI SYMPTOMS: "Have you had any of the following symptoms: abdominal pain, vomiting, diarrhea, blood in stools?"     no 10. OTHER SYMPTOMS: "Do you have any other symptoms?"       Left leg numb but has always like that 11. PREGNANCY: "Is there any chance you are pregnant?" "When was your last menstrual period?"       n/a  Protocols used: [facility]

## 2017-11-18 NOTE — Telephone Encounter (Signed)
Dr. Elease Hashimoto did review this note and agreed okay for pt to be seen tomorrow 11/19/2017.

## 2017-11-19 ENCOUNTER — Encounter: Payer: Self-pay | Admitting: Family Medicine

## 2017-11-19 ENCOUNTER — Ambulatory Visit: Payer: Medicare Other | Admitting: Family Medicine

## 2017-11-19 VITALS — BP 136/60 | HR 74 | Temp 98.2°F | Ht 61.0 in | Wt 133.3 lb

## 2017-11-19 DIAGNOSIS — I10 Essential (primary) hypertension: Secondary | ICD-10-CM

## 2017-11-19 DIAGNOSIS — R55 Syncope and collapse: Secondary | ICD-10-CM | POA: Diagnosis not present

## 2017-11-19 NOTE — Progress Notes (Signed)
Subjective:     Patient ID: Emily Cannon, female   DOB: 02-14-1933, 84 y.o.   MRN: [id number]  HPI Patient seen following syncopal episode yesterday. She's had similar episode in the past and had cardiology workup back in 2017 with stress echo which was unremarkable. Carotid Dopplers then were unremarkable as well. She states that yesterday she went for a walk around noon. It was fairly hot outside. She had not had anything to drink other than a couple of cups of coffee that morning. She takes several blood pressure medications. When she returned back home she felt somewhat lightheadedness and this is followed by brief witnessed syncopal episode which lasted less than a minute. No head injury. No confusion or significant weakness afterwards. No history of seizure.  EMS was called. EKG strip is reviewed and showed sinus rhythm. No acute changes. She had no chest pain. Blood pressure by the time EMS arrived was systolic 030. She focused on increased hydration and has felt much better today with no recurrent dizziness whatsoever. No recent palpitations  Past Medical History:  Diagnosis Date  . Acquired absence of organ, genital organs   . Allergic rhinitis, cause unspecified   . Anxiety state, unspecified   . Disorder of bone and cartilage, unspecified   . Hypothyroid   . Irritable bowel syndrome   . Lumbago   . Other and unspecified hyperlipidemia   . Perforation of tympanic membrane, unspecified   . Unspecified essential hypertension    Past Surgical History:  Procedure Laterality Date  . ABDOMINAL HYSTERECTOMY    . cataract surgery  5/12 and 12/24/2012   both eyes  . TONSILLECTOMY      reports that she has never smoked. She has never used smokeless tobacco. She reports that she does not drink alcohol or use drugs. family history includes COPD in her other; Lung cancer (age of onset: 65) in her mother; Lung disease (age of onset: 22) in her father. Allergies  Allergen Reactions  .  Alendronate Sodium     REACTION: pt states "short of breath"  . Amoxicillin     REACTION: causes severe diarrhea  . Codeine     REACTION: pt states nausea  . Valsartan     REACTION: gums/throat swelling     Review of Systems  Constitutional: Negative for fatigue and unexpected weight change.  Eyes: Negative for visual disturbance.  Respiratory: Negative for cough, chest tightness, shortness of breath and wheezing.   Cardiovascular: Negative for chest pain, palpitations and leg swelling.  Endocrine: Negative for polydipsia and polyuria.  Genitourinary: Negative for dysuria.  Neurological: Positive for syncope. Negative for dizziness, seizures, weakness, light-headedness and headaches.  Psychiatric/Behavioral: Negative for confusion.       Objective:   Physical Exam  Constitutional: She is oriented to person, place, and time. She appears well-developed and well-nourished.  Neck: Neck supple. No thyromegaly present.  Cardiovascular: Normal rate and regular rhythm. Exam reveals no gallop.  Pulmonary/Chest: Effort normal and breath sounds normal. No respiratory distress. She has no wheezes. She has no rales.  Musculoskeletal: She exhibits no edema.  Neurological: She is alert and oriented to person, place, and time. No cranial nerve deficit.       Assessment:     Transient syncopal episode yesterday following exercise and heat. Suspect she had transient drop in blood pressure related to dehydration and multiple blood pressure medications. She is fully back to baseline today and has had previous cardiac workup as above  Plan:     -would recommend observation for now. -Strongly advise avoiding exercising in the heat of the day. -Also advised start drinking more water early in the day even prior to drinking her coffee. -standing blood pressure today is 136/60 - no significant orthostatic drop -Follow-up immediately for any recurrent symptoms or other concerns.  Eulas Post MD [facility]

## 2017-11-19 NOTE — Patient Instructions (Signed)
Start drinking more water early in the day  Avoid walking in the heat of the day  Follow up immediately for any recurrent syncope episodes.

## 2017-12-26 ENCOUNTER — Encounter: Payer: Self-pay | Admitting: Family Medicine

## 2017-12-26 ENCOUNTER — Ambulatory Visit: Payer: Medicare Other | Admitting: Family Medicine

## 2017-12-26 VITALS — BP 124/78 | HR 64 | Temp 98.2°F | Wt 131.1 lb

## 2017-12-26 DIAGNOSIS — I10 Essential (primary) hypertension: Secondary | ICD-10-CM | POA: Diagnosis not present

## 2017-12-26 DIAGNOSIS — E039 Hypothyroidism, unspecified: Secondary | ICD-10-CM | POA: Diagnosis not present

## 2017-12-26 DIAGNOSIS — E7800 Pure hypercholesterolemia, unspecified: Secondary | ICD-10-CM

## 2017-12-26 LAB — BASIC METABOLIC PANEL
BUN: 15 mg/dL (ref 6–23)
CALCIUM: 10.4 mg/dL (ref 8.4–10.5)
CO2: 29 mEq/L (ref 19–32)
CREATININE: 0.82 mg/dL (ref 0.40–1.20)
Chloride: 99 mEq/L (ref 96–112)
GFR: 70.84 mL/min (ref >60.00)
Glucose, Bld: 103 mg/dL — ABNORMAL HIGH (ref 70–99)
Potassium: 4.6 mEq/L (ref 3.5–5.1)
SODIUM: 137 meq/L (ref 135–145)

## 2017-12-26 LAB — LIPID PANEL
Cholesterol: 170 mg/dL (ref 0–200)
HDL: 47.5 mg/dL (ref >39.00)
LDL Cholesterol: 86 mg/dL (ref 0–99)
NonHDL: 122.42
TRIGLYCERIDES: 184 mg/dL — AB (ref 0.0–149.0)
Total CHOL/HDL Ratio: 4
VLDL: 36.8 mg/dL (ref 0.0–40.0)

## 2017-12-26 LAB — HEPATIC FUNCTION PANEL
ALBUMIN: 4.9 g/dL (ref 3.5–5.2)
ALK PHOS: 67 U/L (ref 39–117)
ALT: 13 U/L (ref 0–35)
AST: 14 U/L (ref 0–37)
BILIRUBIN DIRECT: 0.1 mg/dL (ref 0.0–0.3)
TOTAL PROTEIN: 7.8 g/dL (ref 6.0–8.3)
Total Bilirubin: 0.6 mg/dL (ref 0.2–1.2)

## 2017-12-26 LAB — TSH: TSH: 1.97 u[IU]/mL (ref 0.35–4.50)

## 2017-12-26 NOTE — Progress Notes (Signed)
  Subjective:     Patient ID: Emily Cannon, female   DOB: Apr 18, 1933, 84 y.o.   MRN: [id number]  HPI Patient seen for medical follow-up. She has history of hypothyroidism, hyperlipidemia, hypertension. She had episode of syncope recently (after being out in the heat) but has had none since then. She took our advice and has been drinking fluids each morning when she first gets up before she drinks coffee. She had no dizziness whatsoever. Not describing any consistent orthostatic type symptoms.  Due for follow-up labs. Compliant with all medications. Denies any side effects.  Past Medical History:  Diagnosis Date  . Acquired absence of organ, genital organs   . Allergic rhinitis, cause unspecified   . Anxiety state, unspecified   . Disorder of bone and cartilage, unspecified   . Hypothyroid   . Irritable bowel syndrome   . Lumbago   . Other and unspecified hyperlipidemia   . Perforation of tympanic membrane, unspecified   . Unspecified essential hypertension    Past Surgical History:  Procedure Laterality Date  . ABDOMINAL HYSTERECTOMY    . cataract surgery  5/12 and 12/24/2012   both eyes  . TONSILLECTOMY      reports that she has never smoked. She has never used smokeless tobacco. She reports that she does not drink alcohol or use drugs. family history includes COPD in her other; Lung cancer (age of onset: 28) in her mother; Lung disease (age of onset: 50) in her father. Allergies  Allergen Reactions  . Alendronate Sodium     REACTION: pt states "short of breath"  . Amoxicillin     REACTION: causes severe diarrhea  . Codeine     REACTION: pt states nausea  . Valsartan     REACTION: gums/throat swelling     Review of Systems  Constitutional: Negative for fatigue.  Eyes: Negative for visual disturbance.  Respiratory: Negative for cough, chest tightness, shortness of breath and wheezing.   Cardiovascular: Negative for chest pain, palpitations and leg swelling.   Endocrine: Negative for polydipsia and polyuria.  Neurological: Negative for dizziness, seizures, syncope, weakness, light-headedness and headaches.       Objective:   Physical Exam  Constitutional: She appears well-developed and well-nourished.  Eyes: Pupils are equal, round, and reactive to light.  Neck: Neck supple. No JVD present. No thyromegaly present.  Cardiovascular: Normal rate and regular rhythm. Exam reveals no gallop.  Pulmonary/Chest: Effort normal and breath sounds normal. No respiratory distress. She has no wheezes. She has no rales.  Musculoskeletal: She exhibits no edema.  Neurological: She is alert.       Assessment:     #1 hypertension stable and at goal  #2 hypothyroidism-on replacement mediation.    #3 hyperlipidemia    Plan:     -Recheck labs with lipid panel, hepatic panel, basic metabolic panel, TSH -Continue current medications -Follow-up immediately for any recurrent dizziness or syncope  Eulas Post MD [facility] at [facility]

## 2017-12-30 DIAGNOSIS — H61303 Acquired stenosis of external ear canal, unspecified, bilateral: Secondary | ICD-10-CM | POA: Diagnosis not present

## 2017-12-30 DIAGNOSIS — H6123 Impacted cerumen, bilateral: Secondary | ICD-10-CM | POA: Diagnosis not present

## 2017-12-30 DIAGNOSIS — H903 Sensorineural hearing loss, bilateral: Secondary | ICD-10-CM | POA: Diagnosis not present

## 2018-03-18 DIAGNOSIS — R922 Inconclusive mammogram: Secondary | ICD-10-CM | POA: Diagnosis not present

## 2018-03-18 LAB — HM MAMMOGRAPHY

## 2018-03-23 ENCOUNTER — Encounter: Payer: Self-pay | Admitting: Family Medicine

## 2018-03-25 ENCOUNTER — Other Ambulatory Visit: Payer: Self-pay | Admitting: Family Medicine
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
## 2018-04-08 ENCOUNTER — Ambulatory Visit (INDEPENDENT_AMBULATORY_CARE_PROVIDER_SITE_OTHER): Payer: Medicare Other

## 2018-04-08 DIAGNOSIS — Z0000 Encounter for general adult medical examination without abnormal findings: Secondary | ICD-10-CM

## 2018-04-08 DIAGNOSIS — Z23 Encounter for immunization: Secondary | ICD-10-CM | POA: Diagnosis not present

## 2018-04-08 NOTE — Patient Instructions (Addendum)
Ms. Dombrosky , Thank you for taking time to come for your Medicare Wellness Visit. I appreciate your ongoing commitment to your health goals. Please review the following plan we discussed and let me know if I can assist you in the future.   YOu need another shingrix vaccine within 6 months of the first  Your first one was 01/23/2018  [facility] - can assist with hearing aid x 1  No reviews  [place]  [facility] [phone]  [phone] - denise hayes   [email or web address]_Buy_Hearing_[email or web address]    These are the goals we discussed: Goals    . relaxation     Will consider massage and ways of reducing        This is a list of the screening recommended for you and due dates:  Health Maintenance  Topic Date Due  . Flu Shot  02/26/2018  . Tetanus Vaccine  07/28/2019  . DEXA scan (bone density measurement)  Completed  . Pneumonia vaccines  Completed     Kegel Exercises Kegel exercises help strengthen the muscles that support the rectum, vagina, small intestine, bladder, and uterus. Doing Kegel exercises can help:  Improve bladder and bowel control.  Improve sexual response.  Reduce problems and discomfort during pregnancy.  Kegel exercises involve squeezing your pelvic floor muscles, which are the same muscles you squeeze when you try to stop the flow of urine. The exercises can be done while sitting, standing, or lying down, but it is best to vary your position. Phase 1 exercises 1. Squeeze your pelvic floor muscles tight. You should feel a tight lift in your rectal area. If you are a female, you should also feel a tightness in your vaginal area. Keep your stomach, buttocks, and legs relaxed. 2. Hold the muscles tight for up to 10 seconds. 3. Relax your muscles. Repeat this exercise 50 times a day or as many times as told by your health care provider. Continue to  do this exercise for at least 4-6 weeks or for as long as told by your health care provider. This information is not intended to replace advice given to you by your health care provider. Make sure you discuss any questions you have with your health care provider. Document Released: 07/01/2012 Document Revised: 03/09/2016 Document Reviewed: 06/04/2015 Elsevier Interactive Patient Education  2018 [place] in the Home Falls can cause injuries. They can happen to people of all ages. There are many things you can do to make your home safe and to help prevent falls. What can I do on the outside of my home?  Regularly fix the edges of walkways and driveways and fix any cracks.  Remove anything that might make you trip as you walk through a door, such as a raised step or threshold.  Trim any bushes or trees on the path to your home.  Use bright outdoor lighting.  Clear any walking paths of anything that might make someone trip, such as rocks or tools.  Regularly check to see if handrails are loose or broken. Make sure that both sides of any steps have handrails.  Any raised decks and porches should have guardrails on the edges.  Have any leaves, snow, or ice cleared regularly.  Use sand or salt on walking paths during winter.  Clean up any spills in your garage right away. This includes oil or grease spills. What can I do in the bathroom?  Use night lights.  Install grab bars by the toilet and in the tub and shower. Do not use towel bars as grab bars.  Use non-skid mats or decals in the tub or shower.  If you need to sit down in the shower, use a plastic, non-slip stool.  Keep the floor dry. Clean up any water that spills on the floor as soon as it happens.  Remove soap buildup in the tub or shower regularly.  Attach bath mats securely with double-sided non-slip rug tape.  Do not have throw rugs and other things on the floor that can make you trip. What  can I do in the bedroom?  Use night lights.  Make sure that you have a light by your bed that is easy to reach.  Do not use any sheets or blankets that are too big for your bed. They should not hang down onto the floor.  Have a firm chair that has side arms. You can use this for support while you get dressed.  Do not have throw rugs and other things on the floor that can make you trip. What can I do in the kitchen?  Clean up any spills right away.  Avoid walking on wet floors.  Keep items that you use a lot in easy-to-reach places.  If you need to reach something above you, use a strong step stool that has a grab bar.  Keep electrical cords out of the way.  Do not use floor polish or wax that makes floors slippery. If you must use wax, use non-skid floor wax.  Do not have throw rugs and other things on the floor that can make you trip. What can I do with my stairs?  Do not leave any items on the stairs.  Make sure that there are handrails on both sides of the stairs and use them. Fix handrails that are broken or loose. Make sure that handrails are as long as the stairways.  Check any carpeting to make sure that it is firmly attached to the stairs. Fix any carpet that is loose or worn.  Avoid having throw rugs at the top or bottom of the stairs. If you do have throw rugs, attach them to the floor with carpet tape.  Make sure that you have a light switch at the top of the stairs and the bottom of the stairs. If you do not have them, ask someone to add them for you. What else can I do to help prevent falls?  Wear shoes that: ? Do not have high heels. ? Have rubber bottoms. ? Are comfortable and fit you well. ? Are closed at the toe. Do not wear sandals.  If you use a stepladder: ? Make sure that it is fully opened. Do not climb a closed stepladder. ? Make sure that both sides of the stepladder are locked into place. ? Ask someone to hold it for you, if  possible.  Clearly mark and make sure that you can see: ? Any grab bars or handrails. ? First and last steps. ? Where the edge of each step is.  Use tools that help you move around (mobility aids) if they are needed. These include: ? Canes. ? Walkers. ? Scooters. ? Crutches.  Turn on the lights when you go into a dark area. Replace any light bulbs as soon as they burn out.  Set up your furniture so you have a clear path. Avoid moving your furniture around.  If any  of your floors are uneven, fix them.  If there are any pets around you, be aware of where they are.  Review your medicines with your doctor. Some medicines can make you feel dizzy. This can increase your chance of falling. Ask your doctor what other things that you can do to help prevent falls. This information is not intended to replace advice given to you by your health care provider. Make sure you discuss any questions you have with your health care provider. Document Released: 05/11/2009 Document Revised: 12/21/2015 Document Reviewed: 08/19/2014 Elsevier Interactive Patient Education  2018 [place] Maintenance, Female Adopting a healthy lifestyle and getting preventive care can go a long way to promote health and wellness. Talk with your health care provider about what schedule of regular examinations is right for you. This is a good chance for you to check in with your provider about disease prevention and staying healthy. In between checkups, there are plenty of things you can do on your own. Experts have done a lot of research about which lifestyle changes and preventive measures are most likely to keep you healthy. Ask your health care provider for more information. Weight and diet Eat a healthy diet  Be sure to include plenty of vegetables, fruits, low-fat dairy products, and lean protein.  Do not eat a lot of foods high in solid fats, added sugars, or salt.  Get regular exercise. This is one of  the most important things you can do for your health. ? Most adults should exercise for at least 150 minutes each week. The exercise should increase your heart rate and make you sweat (moderate-intensity exercise). ? Most adults should also do strengthening exercises at least twice a week. This is in addition to the moderate-intensity exercise.  Maintain a healthy weight  Body mass index (BMI) is a measurement that can be used to identify possible weight problems. It estimates body fat based on height and weight. Your health care provider can help determine your BMI and help you achieve or maintain a healthy weight.  For females 75 years of age and older: ? A BMI below 18.5 is considered underweight. ? A BMI of 18.5 to 24.9 is normal. ? A BMI of 25 to 29.9 is considered overweight. ? A BMI of 30 and above is considered obese.  Watch levels of cholesterol and blood lipids  You should start having your blood tested for lipids and cholesterol at 82 years of age, then have this test every 5 years.  You may need to have your cholesterol levels checked more often if: ? Your lipid or cholesterol levels are high. ? You are older than 82 years of age. ? You are at high risk for heart disease.  Cancer screening Lung Cancer  Lung cancer screening is recommended for adults 61-45 years old who are at high risk for lung cancer because of a history of smoking.  A yearly low-dose CT scan of the lungs is recommended for people who: ? Currently smoke. ? Have quit within the past 15 years. ? Have at least a 30-pack-year history of smoking. A pack year is smoking an average of one pack of cigarettes a day for 1 year.  Yearly screening should continue until it has been 15 years since you quit.  Yearly screening should stop if you develop a health problem that would prevent you from having lung cancer treatment.  Breast Cancer  Practice breast self-awareness. This means understanding how your  breasts  normally appear and feel.  It also means doing regular breast self-exams. Let your health care provider know about any changes, no matter how small.  If you are in your 20s or 30s, you should have a clinical breast exam (CBE) by a health care provider every 1-3 years as part of a regular health exam.  If you are 62 or older, have a CBE every year. Also consider having a breast X-ray (mammogram) every year.  If you have a family history of breast cancer, talk to your health care provider about genetic screening.  If you are at high risk for breast cancer, talk to your health care provider about having an MRI and a mammogram every year.  Breast cancer gene (BRCA) assessment is recommended for women who have family members with BRCA-related cancers. BRCA-related cancers include: ? Breast. ? Ovarian. ? Tubal. ? Peritoneal cancers.  Results of the assessment will determine the need for genetic counseling and BRCA1 and BRCA2 testing.  Cervical Cancer Your health care provider may recommend that you be screened regularly for cancer of the pelvic organs (ovaries, uterus, and vagina). This screening involves a pelvic examination, including checking for microscopic changes to the surface of your cervix (Pap test). You may be encouraged to have this screening done every 3 years, beginning at age 25.  For women ages 59-65, health care providers may recommend pelvic exams and Pap testing every 3 years, or they may recommend the Pap and pelvic exam, combined with testing for human papilloma virus (HPV), every 5 years. Some types of HPV increase your risk of cervical cancer. Testing for HPV may also be done on women of any age with unclear Pap test results.  Other health care providers may not recommend any screening for nonpregnant women who are considered low risk for pelvic cancer and who do not have symptoms. Ask your health care provider if a screening pelvic exam is right for you.  If you  have had past treatment for cervical cancer or a condition that could lead to cancer, you need Pap tests and screening for cancer for at least 20 years after your treatment. If Pap tests have been discontinued, your risk factors (such as having a new sexual partner) need to be reassessed to determine if screening should resume. Some women have medical problems that increase the chance of getting cervical cancer. In these cases, your health care provider may recommend more frequent screening and Pap tests.  Colorectal Cancer  This type of cancer can be detected and often prevented.  Routine colorectal cancer screening usually begins at 82 years of age and continues through 82 years of age.  Your health care provider may recommend screening at an earlier age if you have risk factors for colon cancer.  Your health care provider may also recommend using home test kits to check for hidden blood in the stool.  A small camera at the end of a tube can be used to examine your colon directly (sigmoidoscopy or colonoscopy). This is done to check for the earliest forms of colorectal cancer.  Routine screening usually begins at age 59.  Direct examination of the colon should be repeated every 5-10 years through 82 years of age. However, you may need to be screened more often if early forms of precancerous polyps or small growths are found.  Skin Cancer  Check your skin from head to toe regularly.  Tell your health care provider about any new moles or changes in moles, especially  if there is a change in a mole's shape or color.  Also tell your health care provider if you have a mole that is larger than the size of a pencil eraser.  Always use sunscreen. Apply sunscreen liberally and repeatedly throughout the day.  Protect yourself by wearing long sleeves, pants, a wide-brimmed hat, and sunglasses whenever you are outside.  Heart disease, diabetes, and high blood pressure  High blood pressure causes  heart disease and increases the risk of stroke. High blood pressure is more likely to develop in: ? People who have blood pressure in the high end of the normal range (130-139/85-89 mm Hg). ? People who are overweight or obese. ? People who are African American.  If you are 45-21 years of age, have your blood pressure checked every 3-5 years. If you are 93 years of age or older, have your blood pressure checked every year. You should have your blood pressure measured twice-once when you are at a hospital or clinic, and once when you are not at a hospital or clinic. Record the average of the two measurements. To check your blood pressure when you are not at a hospital or clinic, you can use: ? An automated blood pressure machine at a pharmacy. ? A home blood pressure monitor.  If you are between 38 years and 56 years old, ask your health care provider if you should take aspirin to prevent strokes.  Have regular diabetes screenings. This involves taking a blood sample to check your fasting blood sugar level. ? If you are at a normal weight and have a low risk for diabetes, have this test once every three years after 82 years of age. ? If you are overweight and have a high risk for diabetes, consider being tested at a younger age or more often. Preventing infection Hepatitis B  If you have a higher risk for hepatitis B, you should be screened for this virus. You are considered at high risk for hepatitis B if: ? You were born in a country where hepatitis B is common. Ask your health care provider which countries are considered high risk. ? Your parents were born in a high-risk country, and you have not been immunized against hepatitis B (hepatitis B vaccine). ? You have HIV or AIDS. ? You use needles to inject street drugs. ? You live with someone who has hepatitis B. ? You have had sex with someone who has hepatitis B. ? You get hemodialysis treatment. ? You take certain medicines for  conditions, including cancer, organ transplantation, and autoimmune conditions.  Hepatitis C  Blood testing is recommended for: ? Everyone born from [birth year] through 1963. ? Anyone with known risk factors for hepatitis C.  Sexually transmitted infections (STIs)  You should be screened for sexually transmitted infections (STIs) including gonorrhea and chlamydia if: ? You are sexually active and are younger than 82 years of age. ? You are older than 82 years of age and your health care provider tells you that you are at risk for this type of infection. ? Your sexual activity has changed since you were last screened and you are at an increased risk for chlamydia or gonorrhea. Ask your health care provider if you are at risk.  If you do not have HIV, but are at risk, it may be recommended that you take a prescription medicine daily to prevent HIV infection. This is called pre-exposure prophylaxis (PrEP). You are considered at risk if: ? You are sexually active  and do not regularly use condoms or know the HIV status of your partner(s). ? You take drugs by injection. ? You are sexually active with a partner who has HIV.  Talk with your health care provider about whether you are at high risk of being infected with HIV. If you choose to begin PrEP, you should first be tested for HIV. You should then be tested every 3 months for as long as you are taking PrEP. Pregnancy  If you are premenopausal and you may become pregnant, ask your health care provider about preconception counseling.  If you may become pregnant, take 400 to 800 micrograms (mcg) of folic acid every day.  If you want to prevent pregnancy, talk to your health care provider about birth control (contraception). Osteoporosis and menopause  Osteoporosis is a disease in which the bones lose minerals and strength with aging. This can result in serious bone fractures. Your risk for osteoporosis can be identified using a bone density  scan.  If you are 30 years of age or older, or if you are at risk for osteoporosis and fractures, ask your health care provider if you should be screened.  Ask your health care provider whether you should take a calcium or vitamin D supplement to lower your risk for osteoporosis.  Menopause may have certain physical symptoms and risks.  Hormone replacement therapy may reduce some of these symptoms and risks. Talk to your health care provider about whether hormone replacement therapy is right for you. Follow these instructions at home:  Schedule regular health, dental, and eye exams.  Stay current with your immunizations.  Do not use any tobacco products including cigarettes, chewing tobacco, or electronic cigarettes.  If you are pregnant, do not drink alcohol.  If you are breastfeeding, limit how much and how often you drink alcohol.  Limit alcohol intake to no more than 1 drink per day for nonpregnant women. One drink equals 12 ounces of beer, 5 ounces of wine, or 1 ounces of hard liquor.  Do not use street drugs.  Do not share needles.  Ask your health care provider for help if you need support or information about quitting drugs.  Tell your health care provider if you often feel depressed.  Tell your health care provider if you have ever been abused or do not feel safe at home. This information is not intended to replace advice given to you by your health care provider. Make sure you discuss any questions you have with your health care provider. Document Released: 01/28/2011 Document Revised: 12/21/2015 Document Reviewed: 04/18/2015 Elsevier Interactive Patient Education  [employer].

## 2018-04-15 ENCOUNTER — Other Ambulatory Visit: Payer: Self-pay | Admitting: Family Medicine

## 2018-04-17 IMAGING — DX DG CERVICAL SPINE COMPLETE 4+V
6 series · 6 of 6 positions shown · non-contrast
Comparison: None.

CLINICAL DATA: Mass in left side of neck

EXAM:
CERVICAL SPINE - COMPLETE 4+ VIEW

[c-spine lat]
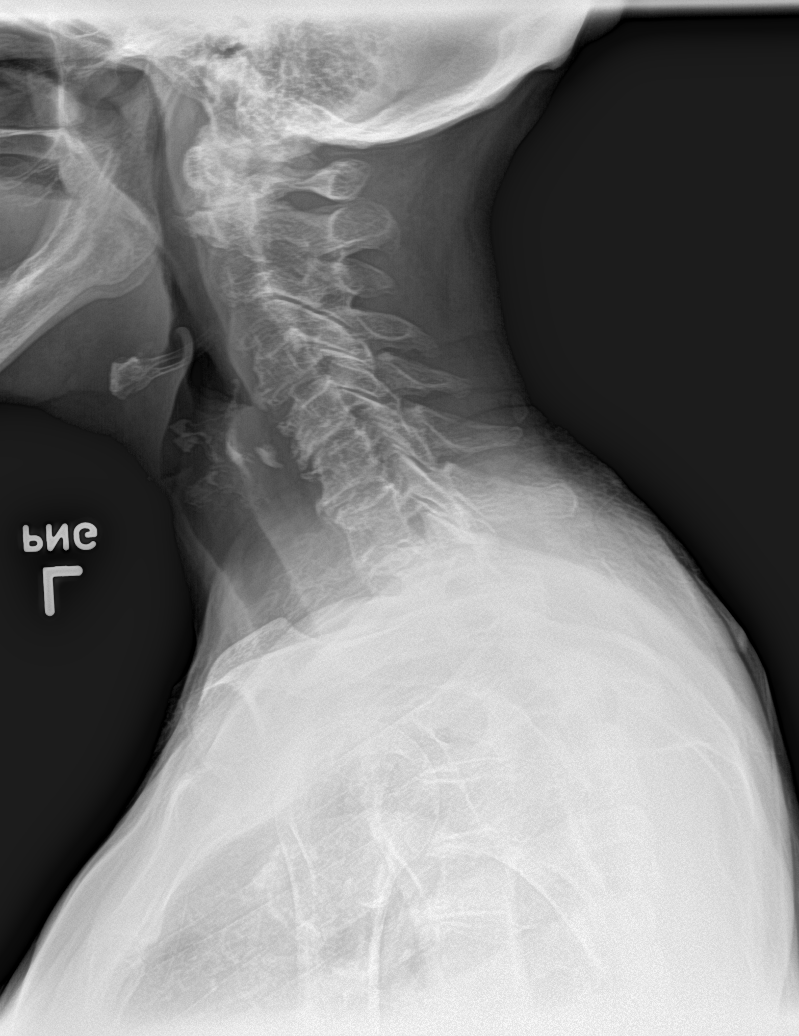

[c-spine obl (1 of 2)]
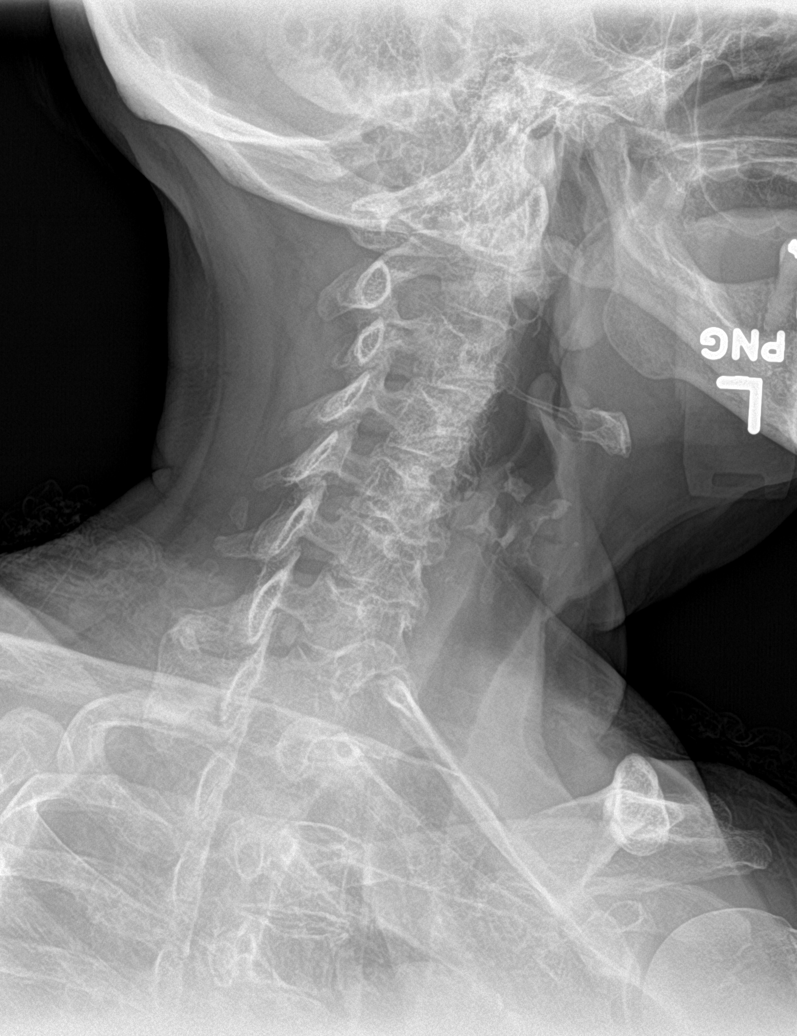

[c-spine obl (2 of 2)]
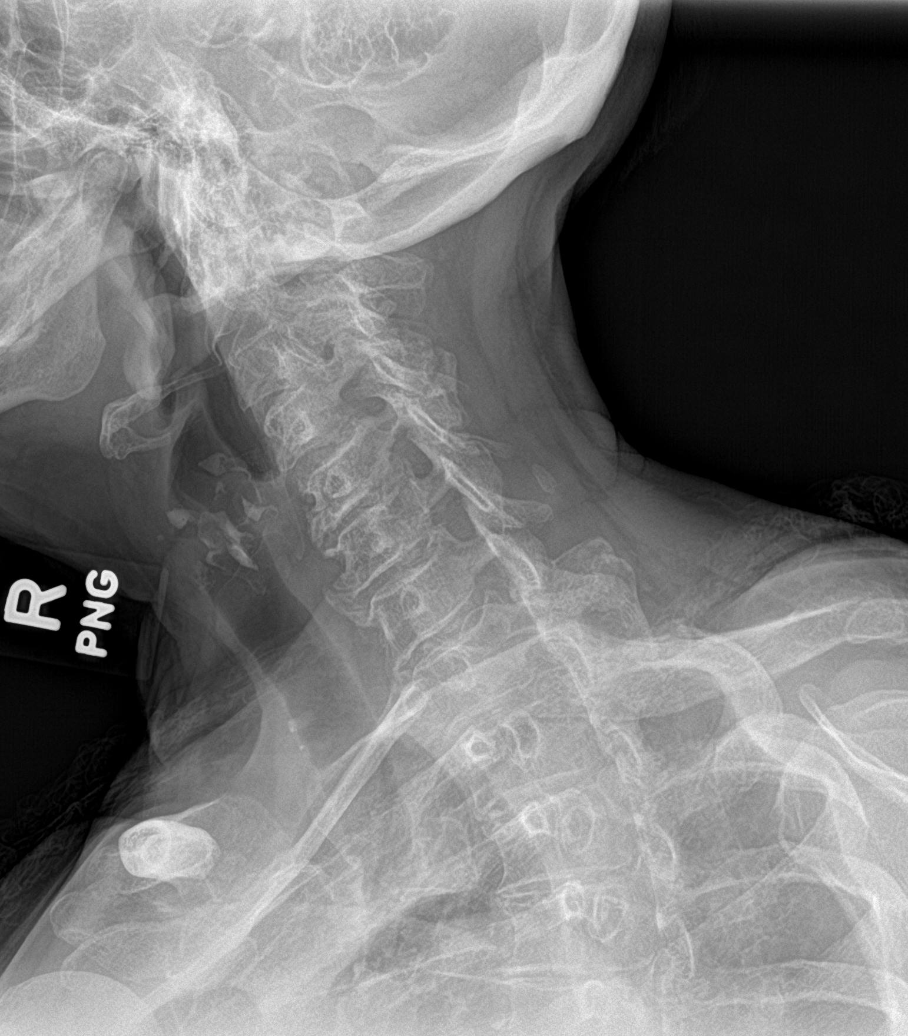

[c-spine ap]
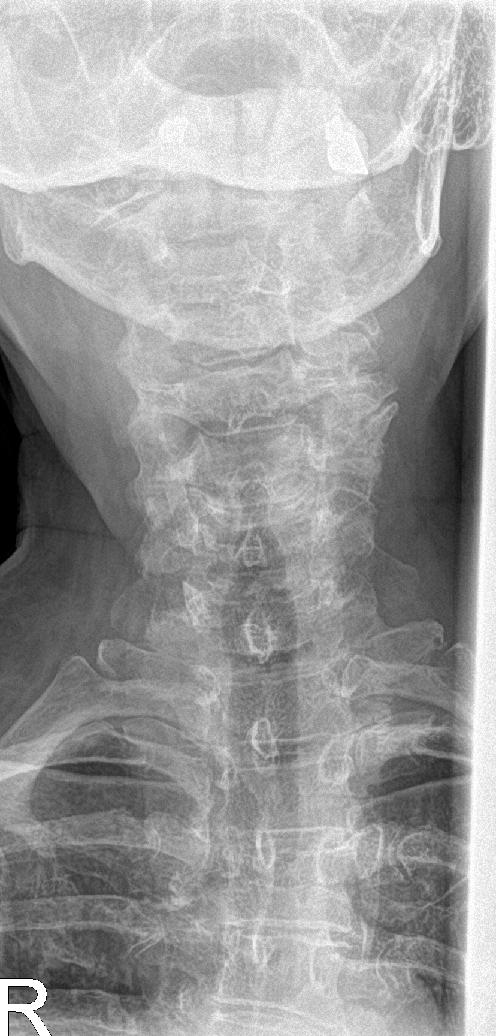

[c-spine open mouth (1 of 2)]
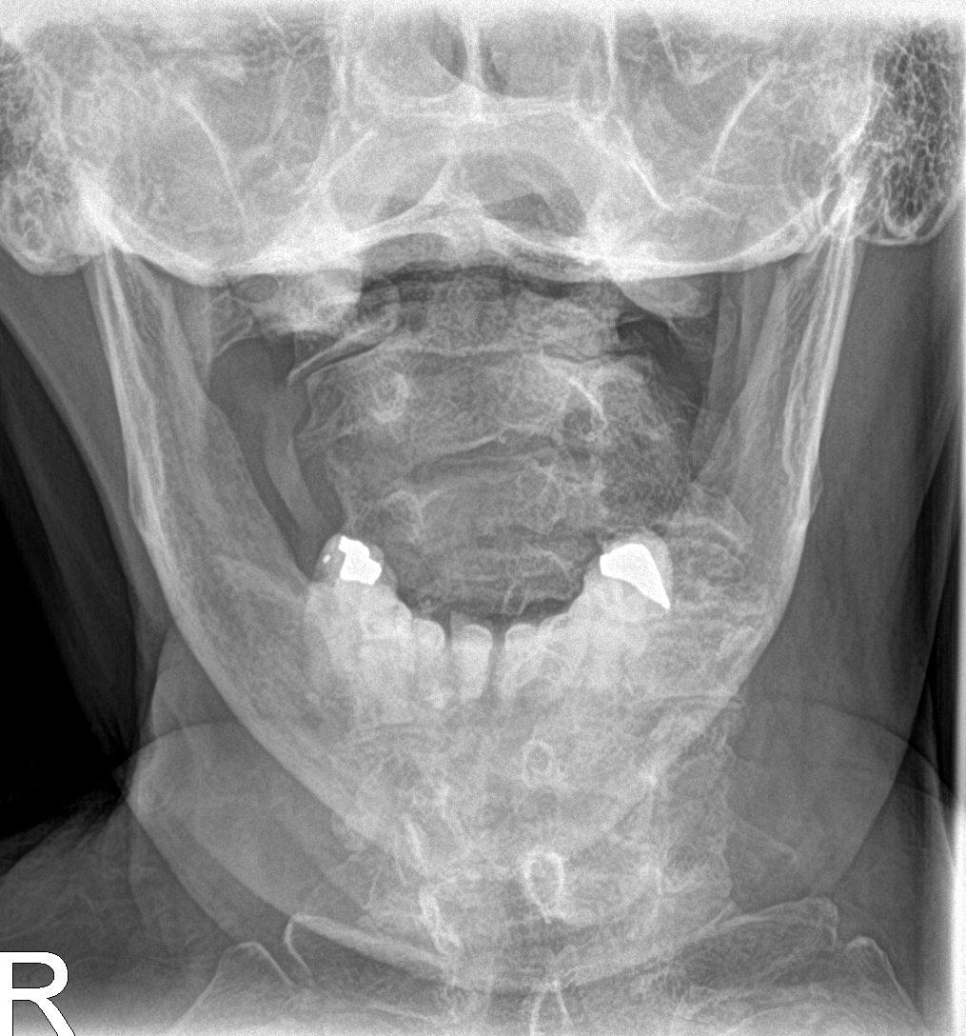

[c-spine open mouth (2 of 2)]
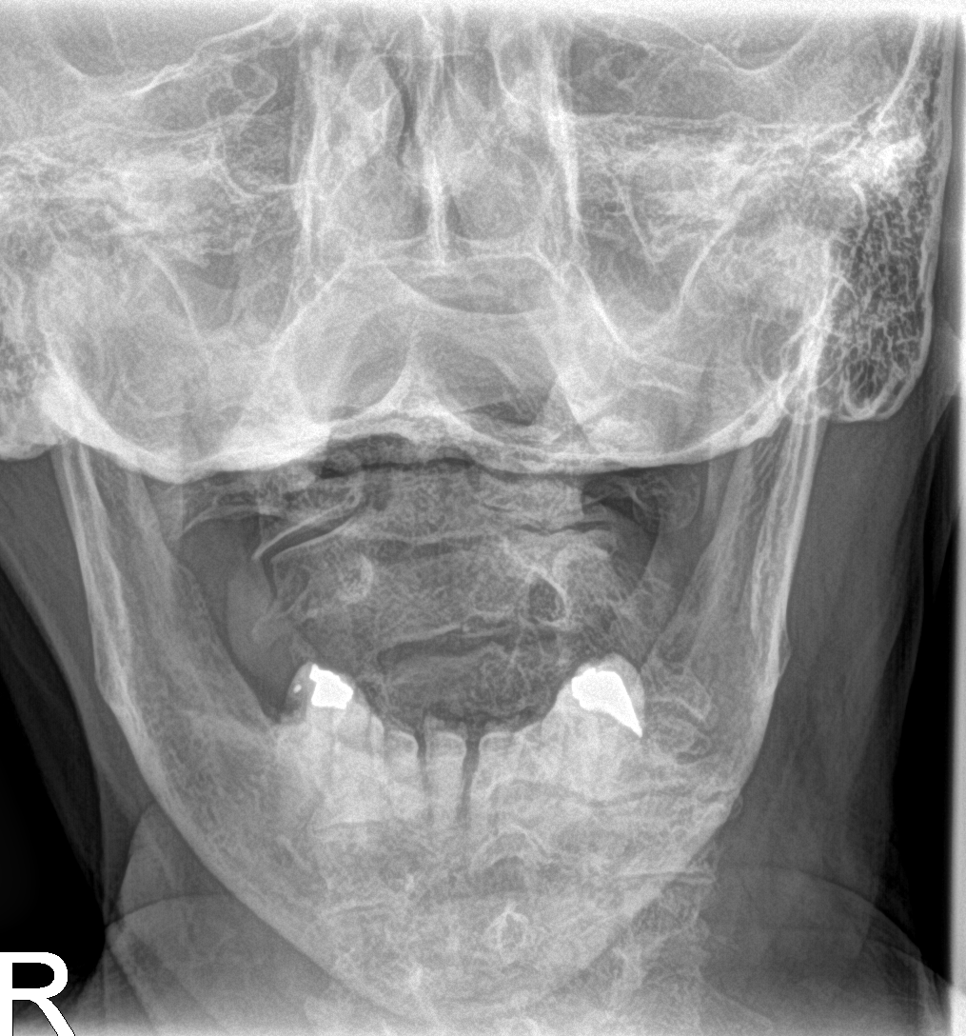

[6 of 6 positions shown; findings below may reference images not displayed]

FINDINGS: There is limited visualization of the upper cervical spine secondary
to motion and positioning. There is slight anterolisthesis C4 upon
C5. Multilevel disc space narrowing and osteophyte formation is
present. Mild right C5-6 uncovertebral osteophytes. Mild left C4-5
uncovertebral osteophytes. Odontoid is obscured. No definite
vertebral compression deformity.
IMPRESSION: Chronic changes as described.  No definite acute bony pathology.

## 2018-05-12 ENCOUNTER — Ambulatory Visit: Payer: Medicare Other | Admitting: Family Medicine

## 2018-05-12 ENCOUNTER — Encounter: Payer: Self-pay | Admitting: Family Medicine

## 2018-05-12 VITALS — BP 140/70 | Temp 98.2°F | Wt 130.2 lb

## 2018-05-12 DIAGNOSIS — J069 Acute upper respiratory infection, unspecified: Secondary | ICD-10-CM | POA: Insufficient documentation

## 2018-05-12 NOTE — Progress Notes (Signed)
Emily Cannon is a 84 year old married female non-smoker who comes in today with a 1 day history of head congestion scratchy throat and fever  Body temperature today 98.2  Review of system negative  Vital signs weight 130 pounds temperature 98.2 BP 140/70  Well-developed well nourished female in no acute distress HEENT were negative  1.  Viral syndrome x24 hours............ long discussion about how to treat viral infections

## 2018-05-12 NOTE — Patient Instructions (Signed)
Drink lots of liquids  Tylenol.......... 2 tabs 3 times daily as needed  Return as needed

## 2018-05-13 ENCOUNTER — Telehealth: Payer: Self-pay | Admitting: Family Medicine

## 2018-05-13 ENCOUNTER — Other Ambulatory Visit: Payer: Self-pay | Admitting: Family Medicine

## 2018-05-13 MED ORDER — BENZONATATE 100 MG PO CAPS: 100.0000 mg | ORAL_CAPSULE | Freq: Two times a day (BID) | ORAL | 1 refills | Status: DC | PRN

## 2018-05-13 NOTE — Telephone Encounter (Signed)
Copied from [place] [id number]. Topic: General - Other >> May 13, 2018  8:00 AM Lennox Solders wrote: Reason for CRM: pt saw dr todd yesterday and would like something for her cough. Pt was up all night coughing Pt has htn. [facility] [address]

## 2018-05-13 NOTE — Telephone Encounter (Signed)
Tessalon pearls e scribed to pt pharmacy.

## 2018-05-13 NOTE — Telephone Encounter (Signed)
Please advise 

## 2018-05-15 ENCOUNTER — Encounter: Payer: Self-pay | Admitting: Family Medicine

## 2018-05-15 ENCOUNTER — Ambulatory Visit: Payer: Medicare Other | Admitting: Family Medicine

## 2018-05-15 VITALS — BP 152/82 | HR 74 | Temp 97.8°F

## 2018-05-15 DIAGNOSIS — J209 Acute bronchitis, unspecified: Secondary | ICD-10-CM

## 2018-05-15 MED ORDER — LEVOFLOXACIN 500 MG PO TABS: 500.0000 mg | ORAL_TABLET | Freq: Every day | ORAL | 0 refills | Status: AC

## 2018-05-15 NOTE — Progress Notes (Signed)
   Subjective:    Patient ID: Emily Cannon, female    DOB: April 04, 1933, 84 y.o.   MRN: [id number]  HPI Here for one week of stuffy head, PND, chest congestion and coughing up green sputum. No fever. Drinking fluids.    Review of Systems  Constitutional: Negative.   HENT: Positive for congestion and postnasal drip. Negative for sinus pressure, sinus pain and sore throat.   Eyes: Negative.   Respiratory: Positive for cough and chest tightness.        Objective:   Physical Exam  Constitutional: She appears well-developed and well-nourished.  HENT:  Right Ear: External ear normal.  Left Ear: External ear normal.  Nose: Nose normal.  Mouth/Throat: Oropharynx is clear and moist.  Eyes: Conjunctivae are normal.  Neck: No thyromegaly present.  Pulmonary/Chest: Effort normal. No stridor. No respiratory distress. She has no wheezes. She has no rales.  Scattered rhonchi   Lymphadenopathy:    She has no cervical adenopathy.          Assessment & Plan:  Bronchitis, treat with Levaquin and Mucinex.  Alysia Penna, MD

## 2018-05-27 ENCOUNTER — Ambulatory Visit: Payer: Medicare Other | Admitting: Family Medicine

## 2018-05-27 ENCOUNTER — Encounter: Payer: Self-pay | Admitting: Family Medicine

## 2018-05-27 ENCOUNTER — Other Ambulatory Visit: Payer: Self-pay

## 2018-05-27 VITALS — BP 140/80 | HR 75 | Temp 97.8°F | Wt 129.1 lb

## 2018-05-27 DIAGNOSIS — R062 Wheezing: Secondary | ICD-10-CM

## 2018-05-27 DIAGNOSIS — J209 Acute bronchitis, unspecified: Secondary | ICD-10-CM | POA: Diagnosis not present

## 2018-05-27 MED ORDER — METHYLPREDNISOLONE ACETATE 80 MG/ML IJ SUSP
80.0000 mg | Freq: Once | INTRAMUSCULAR | Status: AC
  Administered 2018-05-27: 80 mg via INTRAMUSCULAR

## 2018-05-27 NOTE — Progress Notes (Signed)
  Subjective:     Patient ID: Emily Cannon, female   DOB: [birth year], 84 y.o.   MRN: [id number]  HPI  Patient seen for follow-up regarding recent bronchial illness.  She was seen here on the 15th and diagnosed with viral illness.  She was then seen on the 18th and diagnosed with bacterial bronchitis.  She was treated with Levaquin 10-day course.  She feels slightly better now but still has productive cough.  No fever.  No dyspnea.  No hemoptysis.  She feels she may have some wheezing.  Non-smoker.  No history of asthma.  Past Medical History:  Diagnosis Date  . Acquired absence of organ, genital organs   . Allergic rhinitis, cause unspecified   . Anxiety state, unspecified   . Disorder of bone and cartilage, unspecified   . Hypothyroid   . Irritable bowel syndrome   . Lumbago   . Other and unspecified hyperlipidemia   . Perforation of tympanic membrane, unspecified   . Unspecified essential hypertension    Past Surgical History:  Procedure Laterality Date  . ABDOMINAL HYSTERECTOMY    . cataract surgery  5/12 and 12/24/2012   both eyes  . TONSILLECTOMY      reports that she has never smoked. She has never used smokeless tobacco. She reports that she does not drink alcohol or use drugs. family history includes COPD in her other; Lung cancer (age of onset: 35) in her mother; Lung disease (age of onset: 71) in her father. Allergies  Allergen Reactions  . Alendronate Sodium     REACTION: pt states "short of breath"  . Amoxicillin     REACTION: causes severe diarrhea  . Codeine     REACTION: pt states nausea  . Valsartan     REACTION: gums/throat swelling    Review of Systems  Constitutional: Negative for chills and fever.  Respiratory: Positive for cough, shortness of breath and wheezing.        Objective:   Physical Exam  Constitutional: She appears well-developed and well-nourished.  Cardiovascular: Normal rate and regular rhythm.  Pulmonary/Chest:  She has some  diffuse wheezes.  No rales.  Pulse oximetry 97%.  No retractions.       Assessment:     Recent acute bronchial illness treated with antibiotics with reactive airway component but no respiratory distress    Plan:     -Try over-the-counter Mucinex twice daily -Depo-Medrol 80 mg IM given -Follow-up for any shortness of breath, fever, or other concerns  Eulas Post MD [facility] at [facility]

## 2018-05-27 NOTE — Patient Instructions (Signed)
Try OTC Mucinex 800 to 1,000 mg twice daily.  Follow up promptly for any fever or increased shortness of breath.

## 2018-07-01 ENCOUNTER — Encounter: Payer: Self-pay | Admitting: Family Medicine

## 2018-07-01 ENCOUNTER — Ambulatory Visit: Payer: Medicare Other | Admitting: Family Medicine

## 2018-07-01 VITALS — BP 132/70 | HR 65 | Temp 98.3°F | Wt 127.4 lb

## 2018-07-01 DIAGNOSIS — E039 Hypothyroidism, unspecified: Secondary | ICD-10-CM | POA: Diagnosis not present

## 2018-07-01 DIAGNOSIS — I10 Essential (primary) hypertension: Secondary | ICD-10-CM

## 2018-07-01 DIAGNOSIS — E7800 Pure hypercholesterolemia, unspecified: Secondary | ICD-10-CM

## 2018-07-01 MED ORDER — HYDRALAZINE HCL 50 MG PO TABS: 50.0000 mg | ORAL_TABLET | Freq: Two times a day (BID) | ORAL | 0 refills | Status: DC

## 2018-07-01 NOTE — Progress Notes (Signed)
  Subjective:     Patient ID: Emily Cannon, female   DOB: [birth year], 84 y.o.   MRN: [id number]  HPI Patient seen for medical follow-up  Hypertension.  This has been challenging to manage in the past.  She is on multiple medications including amlodipine, HCTZ, Aldactone, hydralazine, metoprolol.  Suspected whitecoat syndrome.  Blood pressure stable by home readings.  Rare lightheadedness.  No headaches.  No chest pains.  No dyspnea.  Hypothyroidism on replacement.  TSH was normal last spring.  Hyperlipidemia treated with Lipitor.  No myalgias.  Not getting much exercise recently.  Husband has had hip replacement.  She has been busy caring for him.  Hopes to start walking more soon.  Compliant with all medications.  Denies any medication side effects.  Past Medical History:  Diagnosis Date  . Acquired absence of organ, genital organs   . Allergic rhinitis, cause unspecified   . Anxiety state, unspecified   . Disorder of bone and cartilage, unspecified   . Hypothyroid   . Irritable bowel syndrome   . Lumbago   . Other and unspecified hyperlipidemia   . Perforation of tympanic membrane, unspecified   . Unspecified essential hypertension    Past Surgical History:  Procedure Laterality Date  . ABDOMINAL HYSTERECTOMY    . cataract surgery  5/12 and 12/24/2012   both eyes  . TONSILLECTOMY      reports that she has never smoked. She has never used smokeless tobacco. She reports that she does not drink alcohol or use drugs. family history includes COPD in her other; Lung cancer (age of onset: 50) in her mother; Lung disease (age of onset: 4) in her father. Allergies  Allergen Reactions  . Alendronate Sodium     REACTION: pt states "short of breath"  . Amoxicillin     REACTION: causes severe diarrhea  . Codeine     REACTION: pt states nausea  . Valsartan     REACTION: gums/throat swelling     Review of Systems  Constitutional: Negative for fatigue.  Eyes: Negative for visual  disturbance.  Respiratory: Negative for cough, chest tightness, shortness of breath and wheezing.   Cardiovascular: Negative for chest pain, palpitations and leg swelling.  Endocrine: Negative for polydipsia and polyuria.  Neurological: Negative for dizziness, seizures, syncope, weakness, light-headedness and headaches.       Objective:   Physical Exam  Constitutional: She appears well-developed and well-nourished.  Eyes: Pupils are equal, round, and reactive to light.  Neck: Neck supple. No JVD present. No thyromegaly present.  Cardiovascular: Normal rate and regular rhythm. Exam reveals no gallop.  Pulmonary/Chest: Effort normal and breath sounds normal. No respiratory distress. She has no wheezes. She has no rales.  Musculoskeletal: She exhibits no edema.  Neurological: She is alert.       Assessment:     #1 hypertension stable  #2 hyperlipidemia treated with Lipitor.  She is also been on Lopid for several years.  #3 hypothyroidism    Plan:     -Continue current medications -Get back to regular exercise habits -Routine follow-up 6 months and will plan to get lab work then  Eulas Post MD [facility] at [facility]

## 2018-08-20 DIAGNOSIS — H903 Sensorineural hearing loss, bilateral: Secondary | ICD-10-CM | POA: Diagnosis not present

## 2018-08-20 DIAGNOSIS — H61303 Acquired stenosis of external ear canal, unspecified, bilateral: Secondary | ICD-10-CM | POA: Diagnosis not present

## 2018-08-20 DIAGNOSIS — H6123 Impacted cerumen, bilateral: Secondary | ICD-10-CM | POA: Diagnosis not present

## 2018-09-09 ENCOUNTER — Ambulatory Visit: Payer: Medicare Other | Admitting: Family Medicine

## 2018-09-09 ENCOUNTER — Encounter: Payer: Self-pay | Admitting: Family Medicine

## 2018-09-09 ENCOUNTER — Other Ambulatory Visit: Payer: Self-pay

## 2018-09-09 VITALS — BP 140/88 | HR 69 | Temp 98.0°F | Ht 60.0 in | Wt 130.3 lb

## 2018-09-09 DIAGNOSIS — J988 Other specified respiratory disorders: Secondary | ICD-10-CM | POA: Diagnosis not present

## 2018-09-09 DIAGNOSIS — R600 Localized edema: Secondary | ICD-10-CM | POA: Diagnosis not present

## 2018-09-09 DIAGNOSIS — R791 Abnormal coagulation profile: Secondary | ICD-10-CM

## 2018-09-09 DIAGNOSIS — R233 Spontaneous ecchymoses: Secondary | ICD-10-CM | POA: Diagnosis not present

## 2018-09-09 LAB — CBC WITH DIFFERENTIAL/PLATELET
BASOS ABS: 0.1 10*3/uL (ref 0.0–0.1)
Basophils Relative: 0.7 % (ref 0.0–3.0)
EOS ABS: 0 10*3/uL (ref 0.0–0.7)
Eosinophils Relative: 0.6 % (ref 0.0–5.0)
HCT: 36.1 % (ref 36.0–46.0)
HEMOGLOBIN: 12 g/dL (ref 12.0–15.0)
LYMPHS PCT: 17.9 % (ref 12.0–46.0)
Lymphs Abs: 1.5 10*3/uL (ref 0.7–4.0)
MCHC: 33.2 g/dL (ref 30.0–36.0)
MCV: 88.9 fl (ref 78.0–100.0)
MONO ABS: 0.7 10*3/uL (ref 0.1–1.0)
Monocytes Relative: 8.5 % (ref 3.0–12.0)
NEUTROS ABS: 6.1 10*3/uL (ref 1.4–7.7)
Neutrophils Relative %: 72.3 % (ref 43.0–77.0)
PLATELETS: 296 10*3/uL (ref 150.0–400.0)
RBC: 4.06 Mil/uL (ref 3.87–5.11)
RDW: 13.7 % (ref 11.5–15.5)
WBC: 8.4 10*3/uL (ref 4.0–10.5)

## 2018-09-09 LAB — COMPREHENSIVE METABOLIC PANEL
ALT: 13 U/L (ref 0–35)
AST: 16 U/L (ref 0–37)
Albumin: 4.5 g/dL (ref 3.5–5.2)
Alkaline Phosphatase: 77 U/L (ref 39–117)
BUN: 17 mg/dL (ref 6–23)
CHLORIDE: 100 meq/L (ref 96–112)
CO2: 28 meq/L (ref 19–32)
Calcium: 9.9 mg/dL (ref 8.4–10.5)
Creatinine, Ser: 0.91 mg/dL (ref 0.40–1.20)
GFR: 59 mL/min — AB (ref >60.00)
GLUCOSE: 97 mg/dL (ref 70–99)
Potassium: 4.7 mEq/L (ref 3.5–5.1)
Sodium: 137 mEq/L (ref 135–145)
Total Bilirubin: 0.5 mg/dL (ref 0.2–1.2)
Total Protein: 7.5 g/dL (ref 6.0–8.3)

## 2018-09-09 LAB — APTT: APTT: 69.3 s — AB (ref 23.4–32.7)

## 2018-09-09 LAB — PROTIME-INR
INR: 1 ratio (ref 0.8–1.0)
PROTHROMBIN TIME: 12.2 s (ref 9.6–13.1)

## 2018-09-09 NOTE — Patient Instructions (Signed)
  Avoid eating within 2 to 3 hours of bedtime  Consider starting over-the-counter Nexium or Prilosec 20 mg once daily  Consider elevating head of bed about 4 to 5 inches  Gradually reduce caffeine intake  Avoid all mint products  Touch base if upper airway congestion not improving over the next several weeks with the above

## 2018-09-09 NOTE — Progress Notes (Signed)
Subjective:     Patient ID: Emily Cannon, female   DOB: 02-12-1933, 85 y.o.   MRN: [id number]  HPI Patient is seen with some left ankle swelling greater than right over the past week or so.  Denies any injury.  No ankle pain.  She has also noticed some nonblanching petechial type lesions left leg greater than right.  Denies any prior history of petechiae.  She has not noted any petechiae on her upper extremities or trunk.  No calf pain.  Denies any recent fever, chills, headaches, arthralgias, dyspnea, chest pain  Other issue is that she states since about October she has had some thick mucus frequently early in the mornings and frequently has to clear her throat.  Does not really have any persistent cough.  No dyspnea.  No obvious postnasal drip symptoms.  She does apparently consume several mint candies per day.  Past Medical History:  Diagnosis Date  . Acquired absence of organ, genital organs   . Allergic rhinitis, cause unspecified   . Anxiety state, unspecified   . Disorder of bone and cartilage, unspecified   . Hypothyroid   . Irritable bowel syndrome   . Lumbago   . Other and unspecified hyperlipidemia   . Perforation of tympanic membrane, unspecified   . Unspecified essential hypertension    Past Surgical History:  Procedure Laterality Date  . ABDOMINAL HYSTERECTOMY    . cataract surgery  5/12 and 12/24/2012   both eyes  . TONSILLECTOMY      reports that she has never smoked. She has never used smokeless tobacco. She reports that she does not drink alcohol or use drugs. family history includes COPD in an other family member; Lung cancer (age of onset: 67) in her mother; Lung disease (age of onset: 64) in her father. Allergies  Allergen Reactions  . Alendronate Sodium     REACTION: pt states "short of breath"  . Amoxicillin     REACTION: causes severe diarrhea  . Codeine     REACTION: pt states nausea  . Valsartan     REACTION: gums/throat swelling     Review  of Systems  Constitutional: Negative for chills, fatigue and fever.  HENT: Negative for sore throat.   Eyes: Negative for visual disturbance.  Respiratory: Negative for cough, chest tightness, shortness of breath and wheezing.   Cardiovascular: Negative for chest pain, palpitations and leg swelling.  Gastrointestinal: Negative for abdominal pain, blood in stool, diarrhea, nausea and vomiting.  Genitourinary: Negative for dysuria and hematuria.  Musculoskeletal: Negative for arthralgias.  Skin: Positive for rash.  Neurological: Negative for dizziness, seizures, syncope, weakness, light-headedness and headaches.  Hematological: Negative for adenopathy. Does not bruise/bleed easily.       Objective:   Physical Exam Constitutional:      Appearance: She is well-developed.  Eyes:     Pupils: Pupils are equal, round, and reactive to light.  Neck:     Musculoskeletal: Neck supple.     Thyroid: No thyromegaly.     Vascular: No JVD.  Cardiovascular:     Rate and Rhythm: Normal rate and regular rhythm.     Heart sounds: No gallop.   Pulmonary:     Effort: Pulmonary effort is normal. No respiratory distress.     Breath sounds: Normal breath sounds. No wheezing or rales.  Skin:    Comments: She has a few scattered nonblanching petechial lesions left lower extremity greater than right.  She has some of both.  We  do not appreciate any above the knee and none on her upper extremities or trunk.  She does have some mild nonpitting edema left leg but appears to have very slight edema on the right as well.  No calf tenderness.  Good capillary refill.  Both feet are warm to touch with excellent distal pulses  Neurological:     Mental Status: She is alert.        Assessment:     #1 patient has a few petechiae left lower extremity greater than right.  These are confined only to the lower extremities.  Etiology unclear.  No evidence for any generalized bruising or bleeding  #2 frequent upper  airway mucus and clearing of throat.  No obvious postnasal drip symptoms.  Question silent GERD    Plan:     -Check comprehensive metabolic panel, CBC, PT, PTT -Recommend trial of over-the-counter Prilosec or Nexium 20 mg daily -Gradually reduce caffeine consumption -Consider elevate head of bed about 4 to 6 inches -Avoid mint products -Avoid eating within 2 to 3 hours of bedtime -Reassess in 2 weeks and sooner as needed  Eulas Post MD [facility] at [facility]

## 2018-09-11 ENCOUNTER — Other Ambulatory Visit (INDEPENDENT_AMBULATORY_CARE_PROVIDER_SITE_OTHER): Payer: Medicare Other

## 2018-09-11 ENCOUNTER — Telehealth: Payer: Self-pay | Admitting: Family Medicine

## 2018-09-11 DIAGNOSIS — R791 Abnormal coagulation profile: Secondary | ICD-10-CM | POA: Diagnosis not present

## 2018-09-11 LAB — LIPID PANEL
CHOL/HDL RATIO: 3
Cholesterol: 136 mg/dL (ref 0–200)
HDL: 46 mg/dL (ref >39.00)
LDL Cholesterol: 65 mg/dL (ref 0–99)
NonHDL: 89.63
Triglycerides: 124 mg/dL (ref 0.0–149.0)
VLDL: 24.8 mg/dL (ref 0.0–40.0)

## 2018-09-11 LAB — PROTIME-INR
INR: 1.1 ratio — ABNORMAL HIGH (ref 0.8–1.0)
Prothrombin Time: 12.6 s (ref 9.6–13.1)

## 2018-09-11 LAB — APTT: aPTT: 69.8 s — ABNORMAL HIGH (ref 23.4–32.7)

## 2018-09-11 NOTE — Telephone Encounter (Signed)
Yes

## 2018-09-11 NOTE — Telephone Encounter (Signed)
Pt would like to know if she should stop taking her aspirin for her appointment on Monday?

## 2018-09-11 NOTE — Addendum Note (Signed)
Addended by: Eulas Post on: 09/11/2018 07:35 AM   Modules accepted: Orders

## 2018-09-14 NOTE — Telephone Encounter (Signed)
I called the pt, apologized for responding to the message today and informed her of the message below.  She stated she will stop taking this and has an appt on Wednesday.

## 2018-09-16 ENCOUNTER — Encounter: Payer: Self-pay | Admitting: Family Medicine

## 2018-09-16 ENCOUNTER — Other Ambulatory Visit: Payer: Self-pay

## 2018-09-16 ENCOUNTER — Ambulatory Visit: Payer: Medicare Other | Admitting: Family Medicine

## 2018-09-16 VITALS — BP 128/62 | HR 76 | Temp 98.1°F | Ht 60.0 in | Wt 128.9 lb

## 2018-09-16 DIAGNOSIS — R791 Abnormal coagulation profile: Secondary | ICD-10-CM | POA: Diagnosis not present

## 2018-09-16 DIAGNOSIS — R233 Spontaneous ecchymoses: Secondary | ICD-10-CM | POA: Diagnosis not present

## 2018-09-16 NOTE — Patient Instructions (Signed)
Continue to avoid aspirin or any other nonsteroidal such as ibuprofen, Motrin, or Aleve.  Tylenol is okay to take  We will set up hematology referral

## 2018-09-16 NOTE — Progress Notes (Signed)
  Subjective:     Patient ID: Emily Cannon, female   DOB: Mar 21, 1933, 85 y.o.   MRN: [id number]  HPI Patient is seen for follow-up regarding recent petechiae lower extremities.  We obtained lab work and her CBC was normal.  Normal platelet count.  Normal INR.  She did have elevated PTT of 69.  This was repeated and we obtained the same result.  She has not had any new petechiae.  She has occasional very mild nosebleeds.  No hematuria.  No bloody stools.  Denies any petechiae or easy bruising on her trunk or upper extremities.  No gum bleeding.  Other than some mild edema lower extremities recently has felt well.  No dyspnea.  No chest pains.  She has hypertension and is on multiple medications for that  Past Medical History:  Diagnosis Date  . Acquired absence of organ, genital organs   . Allergic rhinitis, cause unspecified   . Anxiety state, unspecified   . Disorder of bone and cartilage, unspecified   . Hypothyroid   . Irritable bowel syndrome   . Lumbago   . Other and unspecified hyperlipidemia   . Perforation of tympanic membrane, unspecified   . Unspecified essential hypertension    Past Surgical History:  Procedure Laterality Date  . ABDOMINAL HYSTERECTOMY    . cataract surgery  5/12 and 12/24/2012   both eyes  . TONSILLECTOMY      reports that she has never smoked. She has never used smokeless tobacco. She reports that she does not drink alcohol or use drugs. family history includes COPD in an other family member; Lung cancer (age of onset: 38) in her mother; Lung disease (age of onset: 4) in her father. Allergies  Allergen Reactions  . Alendronate Sodium     REACTION: pt states "short of breath"  . Amoxicillin     REACTION: causes severe diarrhea  . Codeine     REACTION: pt states nausea  . Valsartan     REACTION: gums/throat swelling     Review of Systems  Constitutional: Negative for chills and fever.  Respiratory: Negative for cough and shortness of breath.    Cardiovascular: Negative for chest pain.  Gastrointestinal: Negative for abdominal pain and blood in stool.  Genitourinary: Negative for hematuria.  Hematological: Negative for adenopathy. Does not bruise/bleed easily.       Objective:   Physical Exam Constitutional:      Appearance: Normal appearance.  Neck:     Musculoskeletal: Neck supple.  Cardiovascular:     Rate and Rhythm: Normal rate and regular rhythm.  Pulmonary:     Effort: Pulmonary effort is normal.     Breath sounds: Normal breath sounds.  Skin:    Comments: He has a few nonblanching petechiae lower extremities.  These are basically unchanged from last week.  No new lesions noted.  Neurological:     Mental Status: She is alert.        Assessment:     #1 faint scattered petechiae lower extremities involving legs  #2 elevated PTT.  No known family history of intrinsic pathway problems    Plan:     -We will check von Willebrand factor and factor IX assay -Set up hematology referral -Avoid aspirin or any other non-steroidals -follow up for generalized petechiae or other bleeding issues.  Eulas Post MD [facility] at [facility]

## 2018-09-17 ENCOUNTER — Other Ambulatory Visit: Payer: Self-pay | Admitting: Family Medicine

## 2018-09-21 LAB — VON WILLEBRAND PANEL
Factor-VIII Activity: 173 % normal (ref 50–180)
RISTOCETIN CO-FACTOR: 259 %{normal} — AB (ref 42–200)
VON WILLEBRAND ANTIGEN, PLASMA: 222 % — AB (ref 50–217)
aPTT: 41 s — ABNORMAL HIGH (ref 22–34)

## 2018-09-21 LAB — FACTOR 9 ASSAY: Coagulation Factor IX: 110 % (ref 60–160)

## 2018-09-22 ENCOUNTER — Telehealth: Payer: Self-pay | Admitting: Hematology

## 2018-09-22 ENCOUNTER — Encounter: Payer: Self-pay | Admitting: Hematology

## 2018-09-22 NOTE — Telephone Encounter (Signed)
A new hem appt has been scheduled for the pt to see Dr. Irene Limbo on 3/5 at 1pm. Pt aware to arrive 30 minutes early. Letter mailed.

## 2018-09-24 DIAGNOSIS — Z1231 Encounter for screening mammogram for malignant neoplasm of breast: Secondary | ICD-10-CM | POA: Diagnosis not present

## 2018-09-24 LAB — HM MAMMOGRAPHY

## 2018-10-01 ENCOUNTER — Ambulatory Visit (HOSPITAL_COMMUNITY)
Admission: RE | Admit: 2018-10-01 | Discharge: 2018-10-01 | Disposition: A | Discharge status: Home / Self Care | Payer: Medicare Other | Source: Ambulatory Visit | Attending: Hematology | Admitting: Hematology

## 2018-10-01 ENCOUNTER — Telehealth: Payer: Self-pay | Admitting: Hematology

## 2018-10-01 ENCOUNTER — Inpatient Hospital Stay: Payer: Medicare Other

## 2018-10-01 ENCOUNTER — Inpatient Hospital Stay: Payer: Medicare Other | Attending: Hematology | Admitting: Hematology

## 2018-10-01 VITALS — BP 170/52 | HR 87 | Temp 97.8°F | Resp 18 | Ht 60.0 in | Wt 129.6 lb

## 2018-10-01 DIAGNOSIS — E039 Hypothyroidism, unspecified: Secondary | ICD-10-CM | POA: Diagnosis not present

## 2018-10-01 DIAGNOSIS — R791 Abnormal coagulation profile: Secondary | ICD-10-CM

## 2018-10-01 DIAGNOSIS — M545 Low back pain: Secondary | ICD-10-CM | POA: Insufficient documentation

## 2018-10-01 DIAGNOSIS — M7989 Other specified soft tissue disorders: Secondary | ICD-10-CM | POA: Diagnosis not present

## 2018-10-01 DIAGNOSIS — E785 Hyperlipidemia, unspecified: Secondary | ICD-10-CM | POA: Insufficient documentation

## 2018-10-01 DIAGNOSIS — Z79899 Other long term (current) drug therapy: Secondary | ICD-10-CM | POA: Insufficient documentation

## 2018-10-01 DIAGNOSIS — Z7982 Long term (current) use of aspirin: Secondary | ICD-10-CM | POA: Diagnosis not present

## 2018-10-01 DIAGNOSIS — I10 Essential (primary) hypertension: Secondary | ICD-10-CM | POA: Diagnosis not present

## 2018-10-01 DIAGNOSIS — R600 Localized edema: Secondary | ICD-10-CM | POA: Insufficient documentation

## 2018-10-01 LAB — CBC WITH DIFFERENTIAL/PLATELET
Abs Immature Granulocytes: 0.05 10*3/uL (ref 0.00–0.07)
BASOS PCT: 1 %
Basophils Absolute: 0 10*3/uL (ref 0.0–0.1)
Eosinophils Absolute: 0.1 10*3/uL (ref 0.0–0.5)
Eosinophils Relative: 1 %
HCT: 29.9 % — ABNORMAL LOW (ref 36.0–46.0)
Hemoglobin: 9.9 g/dL — ABNORMAL LOW (ref 12.0–15.0)
Immature Granulocytes: 1 %
Lymphocytes Relative: 17 %
Lymphs Abs: 1.4 10*3/uL (ref 0.7–4.0)
MCH: 29.6 pg (ref 26.0–34.0)
MCHC: 33.1 g/dL (ref 30.0–36.0)
MCV: 89.3 fL (ref 80.0–100.0)
Monocytes Absolute: 0.7 10*3/uL (ref 0.1–1.0)
Monocytes Relative: 9 %
NEUTROS ABS: 6.1 10*3/uL (ref 1.7–7.7)
Neutrophils Relative %: 71 %
PLATELETS: 312 10*3/uL (ref 150–400)
RBC: 3.35 MIL/uL — ABNORMAL LOW (ref 3.87–5.11)
RDW: 12.7 % (ref 11.5–15.5)
WBC: 8.4 10*3/uL (ref 4.0–10.5)
nRBC: 0 % (ref 0.0–0.2)

## 2018-10-01 LAB — CMP (CANCER CENTER ONLY)
ALBUMIN: 3.7 g/dL (ref 3.5–5.0)
ALT: 11 U/L (ref 0–44)
ANION GAP: 11 (ref 5–15)
AST: 15 U/L (ref 15–41)
Alkaline Phosphatase: 85 U/L (ref 38–126)
BUN: 23 mg/dL (ref 8–23)
CO2: 24 mmol/L (ref 22–32)
Calcium: 9.8 mg/dL (ref 8.9–10.3)
Chloride: 100 mmol/L (ref 98–111)
Creatinine: 1.5 mg/dL — ABNORMAL HIGH (ref 0.44–1.00)
GFR, Est AFR Am: 37 mL/min — ABNORMAL LOW (ref >60)
GFR, Estimated: 32 mL/min — ABNORMAL LOW (ref >60)
Glucose, Bld: 112 mg/dL — ABNORMAL HIGH (ref 70–99)
POTASSIUM: 3.6 mmol/L (ref 3.5–5.1)
Sodium: 135 mmol/L (ref 135–145)
Total Bilirubin: 0.4 mg/dL (ref 0.3–1.2)
Total Protein: 8.4 g/dL — ABNORMAL HIGH (ref 6.5–8.1)

## 2018-10-01 LAB — PROTIME-INR
INR: 1 (ref 0.8–1.2)
PROTHROMBIN TIME: 13 s (ref 11.4–15.2)

## 2018-10-01 LAB — FIBRINOGEN: Fibrinogen: 677 mg/dL — ABNORMAL HIGH (ref 210–475)

## 2018-10-01 LAB — APTT: aPTT: 54 seconds — ABNORMAL HIGH (ref 24–36)

## 2018-10-01 NOTE — Progress Notes (Addendum)
Bilateral lower extremities venous duplex exam completed. Result attempted to call ordering physician's office, no answers. Preliminary notes is available in chart review CV PROC section.  Result also e-faxed to Dr. Elease Hashimoto and Dr. Irene Limbo. Wenceslaus Gist H Lareen Mullings(RDMS RVT) 10/01/18 3:31 PM

## 2018-10-01 NOTE — Progress Notes (Signed)
HEMATOLOGY/ONCOLOGY CONSULTATION NOTE  Date of Service: 10/01/2018  Patient Care Team: Eulas Post, MD as PCP - General (Family Medicine) Stanford Breed Denice Bors, MD as Consulting Physician (Cardiology)  CHIEF COMPLAINTS/PURPOSE OF CONSULTATION:  Elevated Partial Thromboplastin Time  HISTORY OF PRESENTING ILLNESS:  Emily Cannon is a wonderful 85 y.o. female who has been referred to [place] by Dr. Carolann Littler for evaluation and management of Elevated Partial Thromboplastin Time. The pt reports that she is doing well overall.   The pt reports that she has developed a small itchy rash of very small red dots on her lower legs, which began a couple months ago. The pt notes that she hasn't applied lotion since this rash began to appear. She notes that her calf muscles on both legs have begun to be sore as well. The pt notes that her right leg has felt more tight in the last 2-3 weeks. She notes that her ankles have begun swelling in the last couple months as well. The pt notes that her leg and ankle swelling resolves overnight and she does elevate her feet. The pt denies ever having varicose veins. She has normally lived an active lifestyle with going to the local gym and walking frequently. The pt continues on water pills. The pt denies CP or SOB.  She had bronchitis in October, and has had mucous in her chest since then, which has resolved. She notes that sometimes her mucous has shown trace blood. She notes that her phlegm is sometimes brown, sometimes yellow, and she has recently started nexium.   The pt notes that her PCP recently took her off aspirin, which was primarily preventative. The pt notes that her thyroid functions are stable.   The pt denies abnormal nose bleeds, joint bleeds, muscle bleeds, blood in the stools, blood in the urine, or gum bleeds. The pt denies a family history of bleeding problems. The pt had a hysterectomy several years ago, and dental extractions, without any  concerns of excessive bleeding.  Most recent lab results (09/09/18) of CBC w/diff is as follows: all values are WNL. 09/16/18 Von Willebrand Panel revealed all values WNL except for VW Plasma Antigen at 222, Ristocetin Co-factor at 259, and aPTT at 41s. 09/11/18 INR at 1.1, Prothrombin time at 12.6s 09/11/18 aPTT at 69.8  On review of systems, pt reports itchy rash on lower legs, bilateral ankle swelling, good energy levels, eating well, and denies CP, SOB, blood in the stools, joint bleeds, muscle bleeds, blood in the urine, nose bleeds, gum bleeds, unexpected weight loss, changes in bowel habits, changes in urination, abdominal pains, and any other symptoms.   On PMHx the pt reports previous hysterectomy, dental extractions. On Family Hx the pt reports mother with leukemia. Denies bleeding or clotting disorders   MEDICAL HISTORY:  Past Medical History:  Diagnosis Date  . Acquired absence of organ, genital organs   . Allergic rhinitis, cause unspecified   . Anxiety state, unspecified   . Disorder of bone and cartilage, unspecified   . Hypothyroid   . Irritable bowel syndrome   . Lumbago   . Other and unspecified hyperlipidemia   . Perforation of tympanic membrane, unspecified   . Unspecified essential hypertension     SURGICAL HISTORY: Past Surgical History:  Procedure Laterality Date  . ABDOMINAL HYSTERECTOMY    . cataract surgery  5/12 and 12/24/2012   both eyes  . TONSILLECTOMY      SOCIAL HISTORY: Social History   Socioeconomic  History  . Marital status: Married    Spouse name: Emily Cannon  . Number of children: 2  . Years of education: Not on file  . Highest education level: Not on file  Occupational History  . Occupation: retired Health visitor  . Financial resource strain: Not on file  . Food insecurity:    Worry: Not on file    Inability: Not on file  . Transportation needs:    Medical: Not on file    Non-medical: Not on file  Tobacco Use  . Smoking  status: Never Smoker  . Smokeless tobacco: Never Used  Substance and Sexual Activity  . Alcohol use: No  . Drug use: No  . Sexual activity: Not on file  Lifestyle  . Physical activity:    Days per week: Not on file    Minutes per session: Not on file  . Stress: Not on file  Relationships  . Social connections:    Talks on phone: Not on file    Gets together: Not on file    Attends religious service: Not on file    Active member of club or organization: Not on file    Attends meetings of clubs or organizations: Not on file    Relationship status: Not on file  . Intimate partner violence:    Fear of current or ex partner: Not on file    Emotionally abused: Not on file    Physically abused: Not on file    Forced sexual activity: Not on file  Other Topics Concern  . Not on file  Social History Narrative   Son is Emily Cannon--pt here     FAMILY HISTORY: Family History  Problem Relation Age of Onset  . Lung cancer Mother 49  . Lung disease Father 106  . COPD Other        sibling  . Colon cancer Neg Hx     ALLERGIES:  is allergic to alendronate sodium; amoxicillin; codeine; and valsartan.  MEDICATIONS:  Current Outpatient Medications  Medication Sig Dispense Refill  . amLODipine (NORVASC) 10 MG tablet TAKE ONE TABLET BY MOUTH DAILY 90 tablet 1  . aspirin 81 MG tablet Take 81 mg by mouth every evening.     Marland Kitchen atorvastatin (LIPITOR) 20 MG tablet TAKE 1 TABLET (20 MG TOTAL) BY MOUTH DAILY. 90 tablet 1  . diphenhydramine-acetaminophen (TYLENOL PM) 25-500 MG TABS Take 1 tablet by mouth at bedtime as needed.      Marland Kitchen gemfibrozil (LOPID) 600 MG tablet TAKE 1 TABLET BY MOUTH DAILY. 90 tablet 1  . hydrALAZINE (APRESOLINE) 50 MG tablet Take 1 tablet (50 mg total) by mouth 2 (two) times daily. 180 tablet 0  . hydrochlorothiazide (MICROZIDE) 12.5 MG capsule TAKE ONE CAPSULE (12.5 MG TOTAL) BY MOUTH DAILY. 90 capsule 1  . levothyroxine (SYNTHROID, LEVOTHROID) 50 MCG tablet TAKE ONE TABLET  BY MOUTH EVERY MORNING BEFORE BREAKFAST 90 tablet 1  . metoprolol succinate (TOPROL-XL) 50 MG 24 hr tablet TAKE ONE TABLET BY MOUTH TWICE A DAY 180 tablet 1  . spironolactone (ALDACTONE) 25 MG tablet TAKE 1 TABLET (25 MG TOTAL) BY MOUTH DAILY. 90 tablet 1   No current facility-administered medications for this visit.     REVIEW OF SYSTEMS:    10 Point review of Systems was done is negative except as noted above.  PHYSICAL EXAMINATION:  . Vitals:   10/01/18 1326  BP: (!) 170/52  Pulse: 87  Resp: 18  Temp: 97.8 F (36.6  C)  SpO2: 98%   Filed Weights   10/01/18 1326  Weight: 129 lb 9.6 oz (58.8 kg)   .Body mass index is 25.31 kg/m.  GENERAL:alert, in no acute distress and comfortable SKIN: no acute rashes, no significant lesions EYES: conjunctiva are pink and non-injected, sclera anicteric OROPHARYNX: MMM, no exudates, no oropharyngeal erythema or ulceration NECK: supple, no JVD LYMPH:  no palpable lymphadenopathy in the cervical, axillary or inguinal regions LUNGS: clear to auscultation b/l with normal respiratory effort HEART: regular rate & rhythm ABDOMEN:  normoactive bowel sounds , non tender, not distended. Extremity: 2+ right pedal edema with some TTP at calf, 1+ left pedal edema PSYCH: alert & oriented x 3 with fluent speech NEURO: no focal motor/sensory deficits  LABORATORY DATA:  I have reviewed the data as listed  . CBC Latest Ref Rng & Units 09/09/2018 05/31/2016 06/08/2015  WBC 4.0 - 10.5 K/uL 8.4 8.0 7.0  Hemoglobin 12.0 - 15.0 g/dL 12.0 13.6 14.2  Hematocrit 36.0 - 46.0 % 36.1 40.1 43.1  Platelets 150.0 - 400.0 K/uL 296.0 263.0 265.0    . CMP Latest Ref Rng & Units 09/09/2018 12/26/2017 06/27/2017  Glucose 70 - 99 mg/dL 97 103(H) 110(H)  BUN 6 - 23 mg/dL 17 15 16   Creatinine 0.40 - 1.20 mg/dL 0.91 0.82 0.81  Sodium 135 - 145 mEq/L 137 137 137  Potassium 3.5 - 5.1 mEq/L 4.7 4.6 4.0  Chloride 96 - 112 mEq/L 100 99 101  CO2 19 - 32 mEq/L 28 29 28     Calcium 8.4 - 10.5 mg/dL 9.9 10.4 10.4  Total Protein 6.0 - 8.3 g/dL 7.5 7.8 -  Total Bilirubin 0.2 - 1.2 mg/dL 0.5 0.6 -  Alkaline Phos 39 - 117 U/L 77 67 -  AST 0 - 37 U/L 16 14 -  ALT 0 - 35 U/L 13 13 -   Component     Latest Ref Rng & Units 09/16/2018 10/01/2018 10/01/2018          2:26 PM  2:26 PM  Factor-VIII Activity     50 - 180 % normal 173    Von Willebrand Antigen, Plasma     50 - 217 % 222 (H)    Ristocetin Co-Factor     42 - 200 % normal 259 (H)    APTT     24 - 36 seconds 41 (H) 54 (H) 32.4 (H)  aPTT 1:1 Normal Plasma        NOT PERFORMED  aPTT 1:1 Mix Saline        NOT PERFORMED  aPTT 1:1 NP Mix, 60 Min,Incub.        NOT PERFORMED  PTT Lupus Anticoagulant     0.0 - 51.9 sec  52.2 (H)   DRVVT     sec  INSUB   Lupus Anticoag Interp       Comment (A)   Prothrombin Time     11.4 - 15.2 seconds  13.0   INR     0.8 - 1.2  1.0   Coagulation Factor IX     60 - 160 % 110    Factor XII Activity     50 - 150 %  53   Fibrinogen     210 - 475 mg/dL  677 (H)    Lupus Anticoag Interp  CommentAbnormal  VC  Comment: (NOTE)  Results are consistent with the presence of a lupus anticoagulant.      RADIOGRAPHIC STUDIES: I have personally  reviewed the radiological images as listed and agreed with the findings in the report. No results found.  ASSESSMENT & PLAN:  85 y.o. female with  1. Elevated partial Thromboplastin time- likely related to presence of lupus anticoagulant that is affecting her aPTT test. 2. RLE Swelling, 2+ Pedal edemA  PLAN: -Discussed patient's most recent labs 09/09/18 CBC w/diff revealed all values WNL including PLT at 296k and HGB at 12.0.  -Discussed the 09/16/18 Von Willebrand Panel revealed VW Plasma Antigen at 222, Ristocetin Co-factor at 259, aPTT at 41s. Factor VIII and IX both normal. -Discussed the 09/11/18 INR at 1.1, Prothrombin time at 12.6s. 09/11/18 aPTT at 69.8. -Discussed that the pt is not obviously at an increased risk of bleeding  while having abnormal clotting factors -Pt has had dental extractions and surgeries without excessive bleeding -Pt does have TTP at right calf and 2+ right pedal edema -Will order US Venous BLE to rule out DVT--- no evidence of DVT -Venous stasis dermatitis ? -Recommend using graded sports compression socks, and feet elevation. Recommend moisturizing legs well with lotion. -Recommend [place] BLE to evaluate venous and valve function and reflux study with PCP -Pt may also have an element of reactive airway disease from bronchitis, continue follow up with PCP -Will order blood tests today- F XII WNL. Noted to have lupus anticoag positive-- this appears to affecting her aPTT assay without indicating a risk of bleeding. Infact there could an increased risk of clotting. -Will see the pt back based on labs   Labs today [place] lower extremity venous bilaterally today stat RTC with Dr Irene Limbo based on labs and results    All of the patients questions were answered with apparent satisfaction. The patient knows to call the clinic with any problems, questions or concerns.  The total time spent in the appt was 45 minutes and more than 50% was on counseling and direct patient cares.    Sullivan Lone MD MS AAHIVMS [facility] Hematology/Oncology Physician [facility]  (Office):       [phone] (Work cell):  [phone] (Fax):           [phone]  10/01/2018 2:14 PM  I, Baldwin Jamaica, am acting as a scribe for Dr. Sullivan Lone.   .I have reviewed the above documentation for accuracy and completeness, and I agree with the above. Brunetta Genera MD

## 2018-10-01 NOTE — Telephone Encounter (Signed)
Scheduled appts per 3/5 los.  Printed avs.

## 2018-10-02 ENCOUNTER — Other Ambulatory Visit: Payer: Self-pay | Admitting: Family Medicine

## 2018-10-02 ENCOUNTER — Telehealth: Payer: Self-pay | Admitting: *Deleted

## 2018-10-02 NOTE — Telephone Encounter (Signed)
Contacted patient regarding test results per Dr. Grier Mitts directions: Please let patient know her [place] was neg for DVT. Patient verbalized understanding of information.

## 2018-10-03 LAB — PTT-LA MIX: PTT-LA Mix: 50 s — ABNORMAL HIGH (ref 0.0–48.9)

## 2018-10-03 LAB — LUPUS ANTICOAGULANT PANEL: PTT Lupus Anticoagulant: 52.2 s — ABNORMAL HIGH (ref 0.0–51.9)

## 2018-10-03 LAB — HEXAGONAL PHASE PHOSPHOLIPID: Hexagonal Phase Phospholipid: 31 s — ABNORMAL HIGH (ref 0–11)

## 2018-10-03 LAB — FACTOR 12 ASSAY: Factor XII Activity: 53 % (ref 50–150)

## 2018-10-04 LAB — PTT FACTOR INHIBITOR (MIXING STUDY): aPTT: 32.4 s — ABNORMAL HIGH (ref 22.9–30.2)

## 2018-10-05 ENCOUNTER — Ambulatory Visit (INDEPENDENT_AMBULATORY_CARE_PROVIDER_SITE_OTHER): Payer: Medicare Other

## 2018-10-05 ENCOUNTER — Encounter: Payer: Self-pay | Admitting: Family Medicine

## 2018-10-05 ENCOUNTER — Other Ambulatory Visit: Payer: Self-pay

## 2018-10-05 ENCOUNTER — Ambulatory Visit: Payer: Medicare Other | Admitting: Family Medicine

## 2018-10-05 VITALS — BP 136/78 | HR 69 | Temp 98.1°F | Ht 60.0 in | Wt 126.2 lb

## 2018-10-05 DIAGNOSIS — R042 Hemoptysis: Secondary | ICD-10-CM

## 2018-10-05 NOTE — Progress Notes (Signed)
Subjective:     Patient ID: Emily Cannon, female   DOB: 06/30/1933, 85 y.o.   MRN: [id number]  HPI Patient has had recent issues with some petechiae lower extremities and by lab work here had elevated PTT.  She was referred to hematology and had extensive blood work there recently along with Doppler which revealed no DVT. She states today that she has had some intermittent dry cough since October but over the past 3 weeks has had occasional mild hemoptysis.  No dyspnea.  No pleuritic pain.  No fevers or chills.  She had several abnormalities including elevated PTT, increased fibrinogen level, elevated creatinine 1.5, hemoglobin 9.9, elevated hexagonal phase phospholipid.    Denies any appetite or weight changes.  Past Medical History:  Diagnosis Date  . Acquired absence of organ, genital organs   . Allergic rhinitis, cause unspecified   . Anxiety state, unspecified   . Disorder of bone and cartilage, unspecified   . Hypothyroid   . Irritable bowel syndrome   . Lumbago   . Other and unspecified hyperlipidemia   . Perforation of tympanic membrane, unspecified   . Unspecified essential hypertension    Past Surgical History:  Procedure Laterality Date  . ABDOMINAL HYSTERECTOMY    . cataract surgery  5/12 and 12/24/2012   both eyes  . TONSILLECTOMY      reports that she has never smoked. She has never used smokeless tobacco. She reports that she does not drink alcohol or use drugs. family history includes COPD in an other family member; Lung cancer (age of onset: 67) in her mother; Lung disease (age of onset: 50) in her father. Allergies  Allergen Reactions  . Alendronate Sodium     REACTION: pt states "short of breath"  . Amoxicillin     REACTION: causes severe diarrhea  . Codeine     REACTION: pt states nausea  . Valsartan     REACTION: gums/throat swelling     Review of Systems  Constitutional: Negative for chills and fever.  Respiratory: Positive for cough. Negative  for shortness of breath and wheezing.   Cardiovascular: Positive for leg swelling. Negative for chest pain and palpitations.  Gastrointestinal: Negative for abdominal pain, blood in stool, nausea and vomiting.  Genitourinary: Negative for dysuria and hematuria.  Neurological: Negative for dizziness and weakness.  Hematological: Negative for adenopathy.  Psychiatric/Behavioral: Negative for confusion.       Objective:   Physical Exam Constitutional:      Appearance: Normal appearance.  Neck:     Musculoskeletal: Neck supple.  Cardiovascular:     Rate and Rhythm: Normal rate and regular rhythm.  Pulmonary:     Effort: Pulmonary effort is normal.     Breath sounds: Normal breath sounds.  Musculoskeletal:     Comments: Just some mild swelling of both lower extremities.  Skin:    Comments: She has some scattered petechiae on both legs left greater than right  Neurological:     Mental Status: She is alert.        Assessment:     #1 patient presents with several month history of cough.  She did not mention this previously in terms of hemoptysis and this has been relatively mild.  No red flags such as weight loss, fever, pleuritic pain  #2 recent petechiae lower legs  #3 elevated PTT    Plan:     -Obtain chest x-ray to further evaluate -Continue close follow-up with hematology -?consider rheumatology evaluation.  Darnell Level  Dan Europe MD [facility]

## 2018-10-05 NOTE — Patient Instructions (Signed)
We will call you after radiologist over-reads the CXR and after I talk to the hematologist.

## 2018-10-06 ENCOUNTER — Other Ambulatory Visit: Payer: Self-pay

## 2018-10-06 ENCOUNTER — Telehealth: Payer: Self-pay | Admitting: Family Medicine

## 2018-10-06 ENCOUNTER — Other Ambulatory Visit: Payer: Self-pay | Admitting: Family Medicine

## 2018-10-06 MED ORDER — CEFDINIR 300 MG PO CAPS: 300.0000 mg | ORAL_CAPSULE | Freq: Two times a day (BID) | ORAL | 0 refills | Status: DC

## 2018-10-06 NOTE — Telephone Encounter (Signed)
Please advise 

## 2018-10-06 NOTE — Telephone Encounter (Signed)
Called patient and gave her xray results. Patient verbalized an understanding. Prescription has been sent in for patient and she will also pick up antibiotic.  Patient asked if Dr. Elease Hashimoto had her from her other doctor yet? Please advise.

## 2018-10-06 NOTE — Telephone Encounter (Signed)
Copied from [place] [id number]. Topic: General - Other >> Oct 06, 2018  3:00 PM Virl Axe D wrote: Reason for CRM: Pt stated that at her OV yesterday Dr. Elease Hashimoto wanted her to start an antibiotic today because she had the beginning stages of pneumonia however nothing has been sent to her pharmacy. Please advise. CB#[phone]

## 2018-10-06 NOTE — Telephone Encounter (Signed)
Not yet.  I will try to call again tomorrow.

## 2018-10-07 ENCOUNTER — Ambulatory Visit: Payer: Medicare Other | Admitting: Family Medicine

## 2018-10-07 ENCOUNTER — Other Ambulatory Visit: Payer: Self-pay

## 2018-10-07 ENCOUNTER — Encounter: Payer: Self-pay | Admitting: Family Medicine

## 2018-10-07 VITALS — BP 122/64 | HR 83 | Temp 98.2°F | Ht 60.0 in | Wt 128.8 lb

## 2018-10-07 DIAGNOSIS — D649 Anemia, unspecified: Secondary | ICD-10-CM | POA: Diagnosis not present

## 2018-10-07 DIAGNOSIS — L509 Urticaria, unspecified: Secondary | ICD-10-CM | POA: Diagnosis not present

## 2018-10-07 DIAGNOSIS — R918 Other nonspecific abnormal finding of lung field: Secondary | ICD-10-CM

## 2018-10-07 DIAGNOSIS — R233 Spontaneous ecchymoses: Secondary | ICD-10-CM | POA: Diagnosis not present

## 2018-10-07 DIAGNOSIS — N289 Disorder of kidney and ureter, unspecified: Secondary | ICD-10-CM

## 2018-10-07 LAB — CBC WITH DIFFERENTIAL/PLATELET
Basophils Absolute: 0 10*3/uL (ref 0.0–0.1)
Basophils Relative: 0.5 % (ref 0.0–3.0)
EOS PCT: 0.9 % (ref 0.0–5.0)
Eosinophils Absolute: 0.1 10*3/uL (ref 0.0–0.7)
HCT: 26.9 % — ABNORMAL LOW (ref 36.0–46.0)
Hemoglobin: 9.2 g/dL — ABNORMAL LOW (ref 12.0–15.0)
Lymphocytes Relative: 16.6 % (ref 12.0–46.0)
Lymphs Abs: 1.5 10*3/uL (ref 0.7–4.0)
MCHC: 34.3 g/dL (ref 30.0–36.0)
MCV: 87 fl (ref 78.0–100.0)
MONOS PCT: 7.1 % (ref 3.0–12.0)
Monocytes Absolute: 0.7 10*3/uL (ref 0.1–1.0)
Neutro Abs: 7 10*3/uL (ref 1.4–7.7)
Neutrophils Relative %: 74.9 % (ref 43.0–77.0)
Platelets: 363 10*3/uL (ref 150.0–400.0)
RBC: 3.09 Mil/uL — ABNORMAL LOW (ref 3.87–5.11)
RDW: 13 % (ref 11.5–15.5)
WBC: 9.3 10*3/uL (ref 4.0–10.5)

## 2018-10-07 LAB — SEDIMENTATION RATE: Sed Rate: 94 mm/hr — ABNORMAL HIGH (ref 0–30)

## 2018-10-07 LAB — URINALYSIS, ROUTINE W REFLEX MICROSCOPIC
Bilirubin Urine: NEGATIVE
Ketones, ur: NEGATIVE
Nitrite: POSITIVE — AB
Specific Gravity, Urine: 1.01 (ref 1.000–1.030)
Total Protein, Urine: 30 — AB
Urine Glucose: NEGATIVE
Urobilinogen, UA: 0.2 (ref 0.0–1.0)
pH: 6 (ref 5.0–8.0)

## 2018-10-07 LAB — BASIC METABOLIC PANEL
BUN: 27 mg/dL — ABNORMAL HIGH (ref 6–23)
CO2: 25 mEq/L (ref 19–32)
Calcium: 9.2 mg/dL (ref 8.4–10.5)
Chloride: 99 mEq/L (ref 96–112)
Creatinine, Ser: 1.6 mg/dL — ABNORMAL HIGH (ref 0.40–1.20)
GFR: 30.76 mL/min — ABNORMAL LOW (ref >60.00)
GLUCOSE: 137 mg/dL — AB (ref 70–99)
Potassium: 3.7 mEq/L (ref 3.5–5.1)
Sodium: 136 mEq/L (ref 135–145)

## 2018-10-07 LAB — C-REACTIVE PROTEIN: CRP: 5.2 mg/dL (ref 0.5–20.0)

## 2018-10-07 MED ORDER — HYDRALAZINE HCL 50 MG PO TABS: 50.0000 mg | ORAL_TABLET | Freq: Two times a day (BID) | ORAL | 0 refills | Status: DC

## 2018-10-07 NOTE — Telephone Encounter (Signed)
Dr. Elease Hashimoto has called patient.

## 2018-10-07 NOTE — Addendum Note (Signed)
Addended by: Elmer Picker on: 10/07/2018 03:45 PM   Modules accepted: Orders

## 2018-10-07 NOTE — Patient Instructions (Addendum)
Keep wound dry for 24 hours- then may gently clean daily with soap and water  Topical antibiotic and keep covered until follow up  Follow up in 9 days for suture removal.  I am putting in rheumatology referral.

## 2018-10-07 NOTE — Progress Notes (Signed)
Subjective:     Patient ID: ADEN YOUNGMAN, female   DOB: Jul 30, 1932, 85 y.o.   MRN: [id number]  HPI Patient was seen recently here middle of February with some increased swelling in her ankles and some nonblanching petechial type lesions mostly left lower leg.  She also related having some cough since about October but no fever.  We obtained lab work including CBC, PT, and PTT.  Her PTT came back significantly elevated.  This was also elevated on repeat.  At that point we obtained von Willebrand panel and set up hematology referral.  Her petechial lesions have been basically unchanged.  She saw hematologist and had several labs done significant for normocytic anemia with hemoglobin 9.9 which was a drop from very recent CBC, creatinine 1.5 up from her baseline recently of 0.8, elevated total protein level 8.4, elevated fibrinogen 677, and mildly elevated lupus anticoagulant.  Patient had venous Doppler with no evidence for DVT.  It was felt her elevated partial thromboplastin time likely related to presence of lupus anticoagulant.  Her platelet counts have remained normal.  She was instructed per hematology to use compression socks and elevation.  She was then seen back in follow-up here 2 days ago with reports of mild intermittent hemoptysis.  No fever.  No chills.  No pleuritic pain.  No dyspnea. We obtained chest x-ray which shows some patchy infiltrates right lung.  We started antibiotic with Omnicef and recommended follow-up to further evaluate.  Her petechial lesions have been basically unchanged.  She denies any significant arthralgias.  No headaches.  No chest pain.  No chronic sinusitis symptoms.  Cough is relatively mild.  She does take hydralazine for hypertension along with several other medications but she has been on hydralazine for several years.  She denies any other skin rashes other than the petechiae on the lower legs  Past Medical History:  Diagnosis Date  . Acquired absence of  organ, genital organs   . Allergic rhinitis, cause unspecified   . Anxiety state, unspecified   . Disorder of bone and cartilage, unspecified   . Hypothyroid   . Irritable bowel syndrome   . Lumbago   . Other and unspecified hyperlipidemia   . Perforation of tympanic membrane, unspecified   . Unspecified essential hypertension    Past Surgical History:  Procedure Laterality Date  . ABDOMINAL HYSTERECTOMY    . cataract surgery  5/12 and 12/24/2012   both eyes  . TONSILLECTOMY      reports that she has never smoked. She has never used smokeless tobacco. She reports that she does not drink alcohol or use drugs. family history includes COPD in an other family member; Lung cancer (age of onset: 62) in her mother; Lung disease (age of onset: 12) in her father. Allergies  Allergen Reactions  . Alendronate Sodium     REACTION: pt states "short of breath"  . Amoxicillin     REACTION: causes severe diarrhea  . Codeine     REACTION: pt states nausea  . Valsartan     REACTION: gums/throat swelling       Review of Systems  Constitutional: Negative for appetite change, chills, fatigue, fever and unexpected weight change.  HENT: Negative for sinus pressure, sinus pain and sore throat.   Respiratory: Positive for cough. Negative for shortness of breath and wheezing.   Cardiovascular: Positive for leg swelling. Negative for chest pain and palpitations.  Gastrointestinal: Negative for abdominal pain, blood in stool, nausea and vomiting.  Genitourinary: Negative for dysuria.  Musculoskeletal: Negative for back pain.  Neurological: Negative for dizziness and headaches.  Hematological: Negative for adenopathy. Does not bruise/bleed easily.       Objective:   Physical Exam Constitutional:      Appearance: Normal appearance.  Neck:     Musculoskeletal: Neck supple.  Cardiovascular:     Rate and Rhythm: Normal rate and regular rhythm.  Pulmonary:     Effort: Pulmonary effort is  normal.     Breath sounds: Normal breath sounds.  Musculoskeletal:     Comments: Trace edema lower legs bilaterally  Lymphadenopathy:     Cervical: No cervical adenopathy.  Skin:    Comments: She has some nonblanching petechiae lower legs bilaterally which have been essentially unchanged over the past few weeks  Neurological:     General: No focal deficit present.     Mental Status: She is alert and oriented to person, place, and time.        Assessment:     Patient presented here few weeks ago with some relatively mild nonblanching petechiae lower legs.  She is had the following findings since then: -Normocytic anemia with hemoglobin 9.9 which was down from around 12.5 few weeks prior -Increased creatinine 1.5 up from her baseline of around 0.8 -Mildly elevated total protein level -Increased partial thromboplastin time with mildly elevated lupus anticoagulant -Increased fibrinogen level 677 -Persistent several month history of cough with nonspecific infiltrates right lung  She is in no distress.  Has no associated arthralgias or fever.  No known history of autoimmune disorders.  She has been on chronic hydralazine but no prior issues with that or any other medications.  Rule out microscopic polyangiitis among other autoimmune disorders.     Plan:     -We recommended biopsy of skin lesion lower extremity.  Rule out vasculitis.  Discussed risk including risk of bleeding, bruising, infection and patient consented.  Anesthetize area around petechial lesion left lower extremity with 1% Xylocaine with epinephrine.  Prepped skin with Betadine.  Using sterile technique throughout obtained 4 mm punch biopsy.  Minimal bleeding.  Closed with 1 suture of 4-0 Ethilon.  Antibiotic and dressing applied.  Wound care instructions given.  Return in 9 days for suture removal  -Obtain further labs with urinalysis, antinuclear antibody,ANCA, repeat CBC, basic metabolic panel, sed rate, C-reactive protein  rheumatoid factor.  -Set up rheumatology referral  -Follow-up immediately for any fever, increased shortness of breath, or any other new or progressive symptoms  Eulas Post MD [facility]

## 2018-10-09 ENCOUNTER — Ambulatory Visit: Payer: Self-pay | Admitting: Family Medicine

## 2018-10-09 LAB — URINE CULTURE
MICRO NUMBER:: 305806
SPECIMEN QUALITY:: ADEQUATE

## 2018-10-09 LAB — ANCA SCREEN W REFLEX TITER
ANCA Screen: POSITIVE — AB
P-ANCA Titer: >1:640 {titer} — ABNORMAL HIGH

## 2018-10-09 LAB — ANTI-DNA ANTIBODY, DOUBLE-STRANDED: ds DNA Ab: 1 IU/mL

## 2018-10-09 LAB — RHEUMATOID FACTOR: Rheumatoid fact SerPl-aCnc: <14 IU/mL (ref <14)

## 2018-10-09 LAB — ANA: ANA: NEGATIVE

## 2018-10-09 NOTE — Telephone Encounter (Signed)
Incoming call from Patient stating that she is on 2 different medications for pneumonia.  One of them side effect diarrhea.  Patient  Requesting advice what she can do related to diarrhea.  Patient rates it severe.  Had 3 very loose and watery stools  yesterday, 2 last night.  Started on Wednesday. Denies vomiting, abdominal  Pain, and fever.  Denies  Signs of dehydration.  [place] out [place].  Provided car advice related to diarrhea.  Patient  Voiced understanding.  Encouraged to call back  If Sx.  Worsen.                                                                         Reason for Disposition . [1] MILD diarrhea (e.g., 1-3 or more stools than normal in past 24 hours) without known cause AND [2] present >  7 days  Answer Assessment - Initial Assessment Questions 1. DIARRHEA SEVERITY: "How bad is the diarrhea?" "How many extra stools have you had in the past 24 hours than normal?"    - NO DIARRHEA (SCALE 0)   - MILD (SCALE 1-3): Few loose or mushy BMs; increase of 1-3 stools over normal daily number of stools; mild increase in ostomy output.   -  MODERATE (SCALE 4-7): Increase of 4-6 stools daily over normal; moderate increase in ostomy output. * SEVERE (SCALE 8-10; OR 'WORST POSSIBLE'): Increase of 7 or more stools daily over normal; moderate increase in ostomy output; incontinence.     Severe aday yesterday   All night  2. ONSET: "When did the diarrhea begin?"     Wednesday 3. BM CONSISTENCY: "How loose or watery is the diarrhea?"      Watery loose 4. VOMITING: "Are you also vomiting?" If so, ask: "How many times in the past 24 hours?"      denies 5. ABDOMINAL PAIN: "Are you having any abdominal pain?" If yes: "What does it feel like?" (e.g., crampy, dull, intermittent, constant)      denies 6. ABDOMINAL PAIN SEVERITY: If present, ask: "How bad is the pain?"  (e.g., Scale 1-10; mild, moderate, or severe)   - MILD (1-3): doesn't interfere with normal activities, abdomen  soft and not tender to touch    - MODERATE (4-7): interferes with normal activities or awakens from sleep, tender to touch    - SEVERE (8-10): excruciating pain, doubled over, unable to do any normal activities       denies 7. ORAL INTAKE: If vomiting, "Have you been able to drink liquids?" "How much fluids have you had in the past 24 hours?"     *No Answer* 8. HYDRATION: "Any signs of dehydration?" (e.g., dry mouth [not just dry lips], too weak to stand, dizziness, new weight loss) "When did you last urinate?"     denies 9. EXPOSURE: "Have you traveled to a foreign country recently?" "Have you been exposed to anyone with diarrhea?" "Could you have eaten any food that was spoiled?"     denies 10. ANTIBIOTIC USE: "Are you taking antibiotics now or have you taken antibiotics in the past 2 months?"        11. OTHER SYMPTOMS: "Do you have any other symptoms?" (e.g., fever, blood  in stool)       Denies, no fever 12. PREGNANCY: "Is there any chance you are pregnant?" "When was your last menstrual period?"       *No Answer*  Protocols used: DIARRHEA-A-AH

## 2018-10-12 ENCOUNTER — Ambulatory Visit: Payer: Medicare Other | Admitting: Family Medicine

## 2018-10-12 ENCOUNTER — Other Ambulatory Visit: Payer: Self-pay | Admitting: Family Medicine

## 2018-10-12 ENCOUNTER — Telehealth: Payer: Self-pay

## 2018-10-12 MED ORDER — LEVOFLOXACIN 250 MG PO TABS: 250.0000 mg | ORAL_TABLET | Freq: Every day | ORAL | 0 refills | Status: DC

## 2018-10-12 NOTE — Telephone Encounter (Signed)
-----   Message from Eulas Post, MD sent at 10/08/2018  9:42 PM EDT ----- Pt has positive p-ANCA with high titer.  Suspect she has an autoimmune disorder, microscopic polyangiitis.  She has order placed for rheumatology referral and we need to expedite this as much as possible.

## 2018-10-12 NOTE — Telephone Encounter (Signed)
Author phoned pt. to relay lab results and Dr. Erick Blinks instructions. Author spoke with both pt. And husband. Levaquin sent in as ordered, omnicef discontinued. Pt. stated she is scheduled to see Dr. Dossie Der on 3/19. Author cancelled appointment with Dr. Elease Hashimoto on 3/18 but kept 3/20 appointment with PCP for follow-up from rheumatology.

## 2018-10-14 ENCOUNTER — Ambulatory Visit: Payer: Medicare Other | Admitting: Family Medicine

## 2018-10-14 ENCOUNTER — Other Ambulatory Visit: Payer: Self-pay

## 2018-10-14 ENCOUNTER — Encounter: Payer: Self-pay | Admitting: Family Medicine

## 2018-10-14 VITALS — BP 108/64 | HR 66 | Temp 98.5°F | Ht 60.0 in | Wt 128.8 lb

## 2018-10-14 DIAGNOSIS — R233 Spontaneous ecchymoses: Secondary | ICD-10-CM | POA: Diagnosis not present

## 2018-10-14 DIAGNOSIS — N289 Disorder of kidney and ureter, unspecified: Secondary | ICD-10-CM

## 2018-10-14 DIAGNOSIS — R768 Other specified abnormal immunological findings in serum: Secondary | ICD-10-CM

## 2018-10-14 DIAGNOSIS — R918 Other nonspecific abnormal finding of lung field: Secondary | ICD-10-CM | POA: Diagnosis not present

## 2018-10-14 NOTE — Patient Instructions (Signed)
Follow up for any fever, increased shortness of breath, or increased leg edema.

## 2018-10-14 NOTE — Progress Notes (Signed)
Subjective:     Patient ID: Emily Cannon, female   DOB: 06-21-1933, 85 y.o.   MRN: [id number]  HPI Patient is here for follow-up regarding several items.  Refer to recent note from 10/07/2018 for details:  "Patient was seen recently here middle of February with some increased swelling in her ankles and some nonblanching petechial type lesions mostly left lower leg.  She also related having some cough since about October but no fever.  We obtained lab work including CBC, PT, and PTT.  Her PTT came back significantly elevated.  This was also elevated on repeat.  At that point we obtained von Willebrand panel and set up hematology referral.  Her petechial lesions have been basically unchanged.  She saw hematologist and had several labs done significant for normocytic anemia with hemoglobin 9.9 which was a drop from very recent CBC, creatinine 1.5 up from her baseline recently of 0.8, elevated total protein level 8.4, elevated fibrinogen 677, and mildly elevated lupus anticoagulant.  Patient had venous Doppler with no evidence for DVT.  It was felt her elevated partial thromboplastin time likely related to presence of lupus anticoagulant.  Her platelet counts have remained normal.  She was instructed per hematology to use compression socks and elevation.  She was then seen back in follow-up here 2 days ago with reports of mild intermittent hemoptysis.  No fever.  No chills.  No pleuritic pain.  No dyspnea. We obtained chest x-ray which shows some patchy infiltrates right lung.  We started antibiotic with Omnicef and recommended follow-up to further evaluate.  Her petechial lesions have been basically unchanged.  She denies any significant arthralgias.  No headaches.  No chest pain.  No chronic sinusitis symptoms.  Cough is relatively mild.  She does take hydralazine for hypertension along with several other medications but she has been on hydralazine for several years.  She denies any other skin  rashes other than the petechiae on the lower legs"  Patient had positive P- ANCA with greater than 1: 640 titer.  Sed rate 94.  Antinuclear antibody and rheumatoid factor negative.  Leg biopsy was read as urticarial type process with hemorrhage.  Cannot rule out early vasculitis. Patient had urinalysis which showed significant pyuria and leukocytes and culture was positive for Citrobacter species.  We ended up placing her on Levaquin.  She denies any urinary symptoms at this time.  No fever.  No dyspnea.  Still has occasional dry cough.  Leg edema seems to be improving some.  Denies any other skin rash.  She was supposed to see a rheumatologist tomorrow but this got postponed to 30 March.  We are trying to get her in as soon as possible to see rheumatology.  Past Medical History:  Diagnosis Date  . Acquired absence of organ, genital organs   . Allergic rhinitis, cause unspecified   . Anxiety state, unspecified   . Disorder of bone and cartilage, unspecified   . Hypothyroid   . Irritable bowel syndrome   . Lumbago   . Other and unspecified hyperlipidemia   . Perforation of tympanic membrane, unspecified   . Unspecified essential hypertension    Past Surgical History:  Procedure Laterality Date  . ABDOMINAL HYSTERECTOMY    . cataract surgery  5/12 and 12/24/2012   both eyes  . TONSILLECTOMY      reports that she has never smoked. She has never used smokeless tobacco. She reports that she does not drink alcohol or use drugs. family  history includes COPD in an other family member; Lung cancer (age of onset: 20) in her mother; Lung disease (age of onset: 11) in her father. Allergies  Allergen Reactions  . Alendronate Sodium     REACTION: pt states "short of breath"  . Amoxicillin     REACTION: causes severe diarrhea  . Codeine     REACTION: pt states nausea  . Valsartan     REACTION: gums/throat swelling     Review of Systems  Constitutional: Negative for chills, fever and  unexpected weight change.  HENT: Negative for congestion.   Respiratory: Positive for cough. Negative for shortness of breath and wheezing.   Cardiovascular: Positive for leg swelling. Negative for chest pain and palpitations.  Gastrointestinal: Negative for abdominal pain.  Genitourinary: Negative for dysuria.       Objective:   Physical Exam Constitutional:      Appearance: She is well-developed.  Eyes:     Pupils: Pupils are equal, round, and reactive to light.  Neck:     Musculoskeletal: Neck supple.     Thyroid: No thyromegaly.     Vascular: No JVD.  Cardiovascular:     Rate and Rhythm: Normal rate and regular rhythm.     Heart sounds: No gallop.   Pulmonary:     Effort: Pulmonary effort is normal. No respiratory distress.     Breath sounds: Normal breath sounds. No wheezing or rales.  Skin:    Comments: Biopsy site left leg is a healing well.  Suture removed.  No signs of secondary infection.  Vasculitic lesions appear to be fading slightly  Neurological:     Mental Status: She is alert.        Assessment:     #1 history of recent lower extremity bilateral petechia with elevated PTT and mildly elevated lupus anticoagulant.  She has very high P-ANCA which suggests possible microscopic polyangiitis.  She has had some recent acute renal insufficiency and also lung finding of infiltrate type lesions on her right lung which may all go along with this process.  Biopsy of the leg somewhat inconclusive  #2 recent UTI currently asymptomatic on Levaquin    Plan:     -Suture removed from left leg biopsy site -We are pushing to try to get an earlier rheumatology appointment than the 30th -Fortunately, patient is symptomatically stable.  We are reluctant to start any steroids or other therapy in the absence of rheumatology consult especially in view of her very stable symptoms at this time.  We explained that she is to follow-up immediately for any fever, increased shortness of  breath, or increased peripheral edema  Eulas Post MD [facility] at [facility]

## 2018-10-16 ENCOUNTER — Ambulatory Visit: Payer: Medicare Other | Admitting: Family Medicine

## 2018-10-19 ENCOUNTER — Ambulatory Visit: Payer: Medicare Other | Admitting: Family Medicine

## 2018-10-22 ENCOUNTER — Other Ambulatory Visit: Payer: Medicare Other

## 2018-10-22 ENCOUNTER — Other Ambulatory Visit: Payer: Self-pay | Admitting: Internal Medicine

## 2018-10-22 ENCOUNTER — Other Ambulatory Visit: Payer: Self-pay

## 2018-10-22 ENCOUNTER — Ambulatory Visit
Admission: RE | Admit: 2018-10-22 | Discharge: 2018-10-22 | Disposition: A | Discharge status: Home / Self Care | Payer: Medicare Other | Source: Ambulatory Visit | Attending: Internal Medicine | Admitting: Internal Medicine

## 2018-10-22 DIAGNOSIS — M317 Microscopic polyangiitis: Secondary | ICD-10-CM

## 2018-10-22 DIAGNOSIS — R05 Cough: Secondary | ICD-10-CM | POA: Diagnosis not present

## 2018-10-22 DIAGNOSIS — R5383 Other fatigue: Secondary | ICD-10-CM | POA: Diagnosis not present

## 2018-10-27 LAB — BASIC METABOLIC PANEL
BUN: 35 — AB (ref 4–21)
Glucose: 114
Potassium: 4.2 (ref 3.4–5.3)
Sodium: 136 — AB (ref 137–147)

## 2018-10-27 LAB — HEPATIC FUNCTION PANEL
ALT: 11 (ref 7–35)
AST: 19 (ref 13–35)
Alkaline Phosphatase: 82 (ref 25–125)
Bilirubin, Total: 0.3

## 2018-10-27 LAB — CBC AND DIFFERENTIAL: Platelets: 365 (ref 150–399)

## 2018-10-28 DIAGNOSIS — M317 Microscopic polyangiitis: Secondary | ICD-10-CM | POA: Diagnosis not present

## 2018-11-04 DIAGNOSIS — M317 Microscopic polyangiitis: Secondary | ICD-10-CM | POA: Diagnosis not present

## 2018-11-05 ENCOUNTER — Encounter: Payer: Self-pay | Admitting: Family Medicine

## 2018-11-11 ENCOUNTER — Telehealth: Payer: Self-pay

## 2018-11-11 DIAGNOSIS — M317 Microscopic polyangiitis: Secondary | ICD-10-CM | POA: Diagnosis not present

## 2018-11-11 NOTE — Telephone Encounter (Signed)
I called Dr Amil Amen.  Emily Cannon has a skin rash and he would like for [place] to evaluate.  At this point, I would keep her appt for Friday and we can look then.

## 2018-11-11 NOTE — Telephone Encounter (Signed)
Please see message.  Copied from [place] [id number]. Topic: General - Inquiry >> Nov 11, 2018 10:09 AM Virl Axe D wrote: Reason for CRM: Dr. Amil Amen with [facility] called speak with Dr. Elease Hashimoto regarding pt. He would like for Dr. Elease Hashimoto to give him a call today if possible. Cell [phone]

## 2018-11-13 ENCOUNTER — Ambulatory Visit: Payer: Medicare Other | Admitting: Family Medicine

## 2018-11-13 ENCOUNTER — Other Ambulatory Visit: Payer: Self-pay

## 2018-11-13 ENCOUNTER — Encounter: Payer: Self-pay | Admitting: Family Medicine

## 2018-11-13 VITALS — BP 132/58 | HR 67 | Temp 97.6°F | Ht 60.0 in | Wt 127.1 lb

## 2018-11-13 DIAGNOSIS — R21 Rash and other nonspecific skin eruption: Secondary | ICD-10-CM | POA: Diagnosis not present

## 2018-11-13 DIAGNOSIS — M317 Microscopic polyangiitis: Secondary | ICD-10-CM | POA: Diagnosis not present

## 2018-11-13 DIAGNOSIS — M8580 Other specified disorders of bone density and structure, unspecified site: Secondary | ICD-10-CM | POA: Diagnosis not present

## 2018-11-13 MED ORDER — MUPIROCIN CALCIUM 2 % EX CREA: 1.0000 "application " | TOPICAL_CREAM | Freq: Two times a day (BID) | CUTANEOUS | 1 refills | Status: DC

## 2018-11-13 NOTE — Progress Notes (Signed)
Subjective:     Patient ID: Emily Cannon, female   DOB: 01-13-1933, 85 y.o.   MRN: [id number]  HPI Patient is seen to evaluate rash which is predominantly on her left lateral hip and buttock region.  Recent history is that she was diagnosed with microscopic polyangiitis.  She was seen here with petechial rash on the legs.  She had very high sed rate along with elevated PTT.  We obtained some other labs which revealed elevated P-ANCA of 1-640 She had elevated creatinine 1.6 and question of infiltrate on chest x-ray along with anemia.  We highly suspected microscopic polyangiitis and she was referred to rheumatology.  She ended up getting high-resolution CT scan which showed diffuse groundglass appearance consistent with vasculitic process.  We had biopsied 1 of her petechial lesions on the leg which showed allergic urticarial type reaction.  Patient is now taking Rituxan injections and is currently on prednisone 60 mg daily and also on Septra DS per rheumatology.  Current rash started a few weeks ago.  She has some mild involvement of the right buttock now.  I had received call from her rheumatologist and he did not think this was shingles but wanted [place] to look to get another opinion.  She has not had any recent fever.  Overall she feels much better.  Her rash is slightly sore to touch.  Less dyspnea.  Good appetite.  Weight is stable.  She has not applied anything topically.  She states she had somewhat similar rash about a year ago though not as pronounced.  No mouth lesions.  Past Medical History:  Diagnosis Date  . Acquired absence of organ, genital organs   . Allergic rhinitis, cause unspecified   . Anxiety state, unspecified   . Disorder of bone and cartilage, unspecified   . Hypothyroid   . Irritable bowel syndrome   . Lumbago   . Other and unspecified hyperlipidemia   . Perforation of tympanic membrane, unspecified   . Unspecified essential hypertension    Past Surgical History:   Procedure Laterality Date  . ABDOMINAL HYSTERECTOMY    . cataract surgery  5/12 and 12/24/2012   both eyes  . TONSILLECTOMY      reports that she has never smoked. She has never used smokeless tobacco. She reports that she does not drink alcohol or use drugs. family history includes COPD in an other family member; Lung cancer (age of onset: 55) in her mother; Lung disease (age of onset: 57) in her father. Allergies  Allergen Reactions  . Alendronate Sodium     REACTION: pt states "short of breath"  . Amoxicillin     REACTION: causes severe diarrhea  . Codeine     REACTION: pt states nausea  . Valsartan     REACTION: gums/throat swelling     Review of Systems  Constitutional: Negative for chills and fever.  Respiratory: Negative for cough and shortness of breath.   Cardiovascular: Negative for chest pain.  Gastrointestinal: Negative for abdominal pain.  Skin: Positive for rash.  Psychiatric/Behavioral: Negative for confusion.       Objective:   Physical Exam Constitutional:      Appearance: Normal appearance.  Cardiovascular:     Rate and Rhythm: Normal rate and regular rhythm.  Skin:    Comments: Patient has rash mostly involving the left upper thigh and lateral hip region.  This extends around toward the buttock.  This does not follow a dermatome distribution.  She has erythematous base  and somewhat ulcerative crusted surface.  Somewhat patchy distribution.  No vesicles.  Neurological:     Mental Status: She is alert.        Assessment:     #1 recent onset skin rash mostly involving the left lateral hip and upper thigh region.  See she has evidence for subcutaneous erythema and what appears to be some mild superficial ulceration.  Doubt shingles.  She has not had any vesicles to suggest Stevens-Johnson syndrome.  Appearance does not seem consistent with toxic epidermal necrolysis.  No evidence for pyoderma gangrenosum.  Almost appears to have more of a vasculitic type  appearance in question some secondary staph infection.  No evidence for diffuse cellulitis  #2 recent diagnosis of microscopic polyangiitis-now on high-dose prednisone and Rituxan along with Septra DS  #3 history of osteopenia.  Patient high risk for progression with current steroid use    Plan:     -Bactroban 2% cream to use twice daily and keep clean with soap and water -Watch closely for any progressive rash or any new symptoms such as fever -Would recommend dermatology referral by early next week if not improving.  May need biopsy for further clarification. -Continue close follow-up with rheumatology -We did mention that given her high-dose prednisone therapy we need to look at bone density scan at some point this year  Eulas Post MD [facility] at [facility]

## 2018-11-13 NOTE — Patient Instructions (Signed)
Granulomatosis With Polyangiitis °Granulomatosis with polyangiitis (GPA), also known as Wegener granulomatosis, is a rare disease of the blood vessels. It causes inflammation of small-sized and medium-sized blood vessels in many parts of the body. This inflammation can interfere with the function of organs that are supplied by the blood vessels. Organs that are commonly affected by this condition include the kidneys, lungs, nose, ears, skin, and eyes. The nervous system is also commonly affected. °This condition can range from mild to severe, and it can develop gradually or very quickly (acute). In severe cases, the condition can cause serious damage to organs such as the lungs or kidneys. Early treatment can help to prevent damage to the organs that are involved. °What are the causes? °The cause of this condition is not known. It occurs when a person’s defense system (immune system) mistakenly attacks the body's own blood vessels, which causes inflammation and damage. This makes GPA an autoimmune disease. °What increases the risk? °This condition is more likely to develop in: °· People who are older than 83 years of age. °· People who are of Caucasian ethnicity. °What are the signs or symptoms? °Symptoms can vary depending on which part of the body is involved. °· General: °? Severe tiredness and weakness. °? Decreased appetite. °? Unintended weight loss. °? Fever or sweats. °? Joint pain or swelling. °? Muscle pain. °· Nose: °? Nose or face pain. °? Runny nose. °? Crusts or sores in the nose. °? Nosebleeds. °· Airway and lungs: °? Cough. °? Chest pain. °? Change in voice. °? Wheezing. °? Shortness of breath. °? Coughing up blood. °· Eyes: °? Eye pain. °? Red eyes. °? Vision problems. °· Ears: °? Hearing problems. °Many other parts of the body can be involved. °How is this diagnosed? °This condition may be diagnosed with: °· A physical exam and medical history. °· Blood tests. °· Urine tests. °· Imaging tests, such  as X-rays or a CT scan. °· Biopsy. A small tissue sample is taken from the nasal passages, lung, kidney, skin, or other affected areas for examination in a lab. °You may be referred to a specialist. This condition can be confused with a number of other rare diseases. °How is this treated? °Aggressive treatment is important for this condition and can lead to remission. GPA is usually treated with: °· Steroid medicines, such as prednisone. °· Other medicines that prevent or slow down your immune system from causing further damage to your blood vessels. °You may also be given antibiotic medicines to prevent infection and to help prevent the disease from returning. °In severe cases, you may be given medicine directly into a vein through an IV tube. A type of blood transfusion (plasmapheresis) may also be done to remove immune system cells that are attacking your blood vessels. If the kidneys are badly damaged, a kidney transplant may be recommended. Because relapses are common, treatments may need to be adjusted. °Follow these instructions at home: °· Take over-the-counter and prescription medicines only as told by your health care provider. °· If you were prescribed an antibiotic medicine, take it as told by your health care provider. Do not stop taking the antibiotic even if you start to feel better. °· Follow instructions from your health care provider about testing for blood in your urine. °· Take vitamins and supplements as told by your health care provider or dietitian. °· Follow instructions from your health care provider about diet. Try to eat regular, healthy meals, even though some of your treatments   might affect your appetite. °· Keep all follow-up visits as told by your health care provider. This is important. °Contact a health care provider if: °· Your symptoms do not improve in the time expected. °· You develop any new symptoms or problems. °· You have a fever. °· You have nausea or diarrhea. °· You  develop a rash. °· You have a sore throat, white patches in your mouth, or difficulty swallowing. °· You have severe fatigue. °· You develop an infection. °Get help right away if: °· You have chest pain. °· You feel short of breath. °· You feel very light-headed, or you pass out. °· You have pain, swelling, or redness anywhere in your legs. °· You have blood in your urine. °· You have uncontrollable bleeding, such as a nosebleed that will not stop. °· You have sudden loss of vision or hearing. °· You have decreased urination. °This information is not intended to replace advice given to you by your health care provider. Make sure you discuss any questions you have with your health care provider. °Document Released: 05/12/2007 Document Revised: 12/21/2015 Document Reviewed: 08/01/2014 °Elsevier Interactive Patient Education © 2019 [employer] ° °

## 2018-11-17 ENCOUNTER — Telehealth: Payer: Self-pay | Admitting: Family Medicine

## 2018-11-17 NOTE — Telephone Encounter (Signed)
Copied from [place] [phone]. Topic: Quick Communication - See Telephone Encounter >> Nov 17, 2018  7:55 AM Rayann Heman wrote: CRM for notification. See Telephone encounter for: 11/17/18. Pt called and stated that she would Dr Elease Hashimoto to know that her back part of her left leg and right leg is red and starting to scab up. Pt stated that medication mupirocin cream (BACTROBAN) 2 % [[id number]] has dried area up some but not all the way. Pt would like a call back regarding

## 2018-11-17 NOTE — Telephone Encounter (Signed)
Called patient and gave her the message from Dr. Elease Hashimoto and patient stated that she will reach out to Dr. [place] Crane and if she is not able to get an appointment soon or if she needs a new referral she will call us back and let us know. Patient verbalized an understanding.

## 2018-11-17 NOTE — Telephone Encounter (Signed)
Please advise 

## 2018-11-17 NOTE — Telephone Encounter (Signed)
She has seen dermatologist (Dr Nevada Crane) in past and if rash not looking any better would consider setting up with him.

## 2018-11-18 DIAGNOSIS — M317 Microscopic polyangiitis: Secondary | ICD-10-CM | POA: Diagnosis not present

## 2018-11-19 DIAGNOSIS — M317 Microscopic polyangiitis: Secondary | ICD-10-CM | POA: Diagnosis not present

## 2018-11-19 DIAGNOSIS — R5383 Other fatigue: Secondary | ICD-10-CM | POA: Diagnosis not present

## 2018-12-02 DIAGNOSIS — I129 Hypertensive chronic kidney disease with stage 1 through stage 4 chronic kidney disease, or unspecified chronic kidney disease: Secondary | ICD-10-CM | POA: Diagnosis not present

## 2018-12-02 DIAGNOSIS — N179 Acute kidney failure, unspecified: Secondary | ICD-10-CM | POA: Diagnosis not present

## 2018-12-02 DIAGNOSIS — R801 Persistent proteinuria, unspecified: Secondary | ICD-10-CM | POA: Diagnosis not present

## 2018-12-02 DIAGNOSIS — D649 Anemia, unspecified: Secondary | ICD-10-CM | POA: Diagnosis not present

## 2018-12-02 DIAGNOSIS — N183 Chronic kidney disease, stage 3 (moderate): Secondary | ICD-10-CM | POA: Diagnosis not present

## 2018-12-02 DIAGNOSIS — R319 Hematuria, unspecified: Secondary | ICD-10-CM | POA: Diagnosis not present

## 2018-12-03 ENCOUNTER — Other Ambulatory Visit: Payer: Self-pay | Admitting: Nephrology

## 2018-12-03 DIAGNOSIS — N179 Acute kidney failure, unspecified: Secondary | ICD-10-CM

## 2018-12-09 ENCOUNTER — Other Ambulatory Visit: Payer: Self-pay | Admitting: Family Medicine

## 2018-12-09 NOTE — Telephone Encounter (Signed)
Refill OK

## 2018-12-09 NOTE — Telephone Encounter (Signed)
OK to continue? 

## 2018-12-10 DIAGNOSIS — I129 Hypertensive chronic kidney disease with stage 1 through stage 4 chronic kidney disease, or unspecified chronic kidney disease: Secondary | ICD-10-CM | POA: Diagnosis not present

## 2018-12-14 ENCOUNTER — Other Ambulatory Visit: Payer: Self-pay

## 2018-12-14 ENCOUNTER — Ambulatory Visit
Admission: RE | Admit: 2018-12-14 | Discharge: 2018-12-14 | Disposition: A | Discharge status: Home / Self Care | Payer: Medicare Other | Source: Ambulatory Visit | Attending: Nephrology | Admitting: Nephrology

## 2018-12-14 DIAGNOSIS — N179 Acute kidney failure, unspecified: Secondary | ICD-10-CM | POA: Diagnosis not present

## 2018-12-15 ENCOUNTER — Emergency Department (HOSPITAL_COMMUNITY): Payer: Medicare Other

## 2018-12-15 ENCOUNTER — Encounter (HOSPITAL_COMMUNITY): Payer: Self-pay

## 2018-12-15 ENCOUNTER — Other Ambulatory Visit: Payer: Self-pay

## 2018-12-15 ENCOUNTER — Inpatient Hospital Stay (HOSPITAL_COMMUNITY)
Admission: EM | Admit: 2018-12-15 | Discharge: 2018-12-19 | DRG: 812 | Disposition: A | Discharge status: Home / Self Care | Payer: Medicare Other | Attending: Family Medicine | Admitting: Family Medicine

## 2018-12-15 DIAGNOSIS — Z801 Family history of malignant neoplasm of trachea, bronchus and lung: Secondary | ICD-10-CM | POA: Diagnosis not present

## 2018-12-15 DIAGNOSIS — Z79890 Hormone replacement therapy (postmenopausal): Secondary | ICD-10-CM

## 2018-12-15 DIAGNOSIS — Z1159 Encounter for screening for other viral diseases: Secondary | ICD-10-CM

## 2018-12-15 DIAGNOSIS — Z79899 Other long term (current) drug therapy: Secondary | ICD-10-CM | POA: Diagnosis not present

## 2018-12-15 DIAGNOSIS — Z825 Family history of asthma and other chronic lower respiratory diseases: Secondary | ICD-10-CM | POA: Diagnosis not present

## 2018-12-15 DIAGNOSIS — E8770 Fluid overload, unspecified: Secondary | ICD-10-CM | POA: Diagnosis not present

## 2018-12-15 DIAGNOSIS — Z6824 Body mass index (BMI) 24.0-24.9, adult: Secondary | ICD-10-CM

## 2018-12-15 DIAGNOSIS — D649 Anemia, unspecified: Principal | ICD-10-CM | POA: Diagnosis present

## 2018-12-15 DIAGNOSIS — E0965 Drug or chemical induced diabetes mellitus with hyperglycemia: Secondary | ICD-10-CM | POA: Diagnosis not present

## 2018-12-15 DIAGNOSIS — I10 Essential (primary) hypertension: Secondary | ICD-10-CM | POA: Diagnosis present

## 2018-12-15 DIAGNOSIS — E876 Hypokalemia: Secondary | ICD-10-CM | POA: Diagnosis not present

## 2018-12-15 DIAGNOSIS — E785 Hyperlipidemia, unspecified: Secondary | ICD-10-CM | POA: Diagnosis present

## 2018-12-15 DIAGNOSIS — I120 Hypertensive chronic kidney disease with stage 5 chronic kidney disease or end stage renal disease: Secondary | ICD-10-CM | POA: Diagnosis not present

## 2018-12-15 DIAGNOSIS — N185 Chronic kidney disease, stage 5: Secondary | ICD-10-CM | POA: Diagnosis not present

## 2018-12-15 DIAGNOSIS — R42 Dizziness and giddiness: Secondary | ICD-10-CM | POA: Diagnosis not present

## 2018-12-15 DIAGNOSIS — L309 Dermatitis, unspecified: Secondary | ICD-10-CM | POA: Diagnosis present

## 2018-12-15 DIAGNOSIS — N179 Acute kidney failure, unspecified: Secondary | ICD-10-CM | POA: Diagnosis not present

## 2018-12-15 DIAGNOSIS — M317 Microscopic polyangiitis: Secondary | ICD-10-CM

## 2018-12-15 DIAGNOSIS — E0922 Drug or chemical induced diabetes mellitus with diabetic chronic kidney disease: Secondary | ICD-10-CM | POA: Diagnosis present

## 2018-12-15 DIAGNOSIS — R627 Adult failure to thrive: Secondary | ICD-10-CM | POA: Diagnosis not present

## 2018-12-15 DIAGNOSIS — Z7952 Long term (current) use of systemic steroids: Secondary | ICD-10-CM

## 2018-12-15 DIAGNOSIS — T380X5A Adverse effect of glucocorticoids and synthetic analogues, initial encounter: Secondary | ICD-10-CM | POA: Diagnosis not present

## 2018-12-15 DIAGNOSIS — E039 Hypothyroidism, unspecified: Secondary | ICD-10-CM | POA: Diagnosis not present

## 2018-12-15 DIAGNOSIS — R918 Other nonspecific abnormal finding of lung field: Secondary | ICD-10-CM | POA: Diagnosis not present

## 2018-12-15 DIAGNOSIS — R739 Hyperglycemia, unspecified: Secondary | ICD-10-CM | POA: Diagnosis not present

## 2018-12-15 DIAGNOSIS — Z90710 Acquired absence of both cervix and uterus: Secondary | ICD-10-CM

## 2018-12-15 DIAGNOSIS — N184 Chronic kidney disease, stage 4 (severe): Secondary | ICD-10-CM | POA: Diagnosis not present

## 2018-12-15 LAB — CBC
HCT: 17.5 % — ABNORMAL LOW (ref 36.0–46.0)
Hemoglobin: 5.5 g/dL — CL (ref 12.0–15.0)
MCH: 30.9 pg (ref 26.0–34.0)
MCHC: 31.4 g/dL (ref 30.0–36.0)
MCV: 98.3 fL (ref 80.0–100.0)
Platelets: 186 10*3/uL (ref 150–400)
RBC: 1.78 MIL/uL — ABNORMAL LOW (ref 3.87–5.11)
RDW: 23.9 % — ABNORMAL HIGH (ref 11.5–15.5)
WBC: 10.3 10*3/uL (ref 4.0–10.5)
nRBC: 8.6 % — ABNORMAL HIGH (ref 0.0–0.2)

## 2018-12-15 LAB — CREATININE, URINE, RANDOM: Creatinine, Urine: 15.49 mg/dL

## 2018-12-15 LAB — URINALYSIS, COMPLETE (UACMP) WITH MICROSCOPIC
Bilirubin Urine: NEGATIVE
Glucose, UA: 50 mg/dL — AB
Ketones, ur: NEGATIVE mg/dL
Leukocytes,Ua: NEGATIVE
Nitrite: NEGATIVE
Protein, ur: 30 mg/dL — AB
Specific Gravity, Urine: 1.006 (ref 1.005–1.030)
pH: 6 (ref 5.0–8.0)

## 2018-12-15 LAB — COMPREHENSIVE METABOLIC PANEL
ALT: 31 U/L (ref 0–44)
AST: 32 U/L (ref 15–41)
Albumin: 3.2 g/dL — ABNORMAL LOW (ref 3.5–5.0)
Alkaline Phosphatase: 41 U/L (ref 38–126)
Anion gap: 14 (ref 5–15)
BUN: 62 mg/dL — ABNORMAL HIGH (ref 8–23)
CO2: 19 mmol/L — ABNORMAL LOW (ref 22–32)
Calcium: 8.6 mg/dL — ABNORMAL LOW (ref 8.9–10.3)
Chloride: 101 mmol/L (ref 98–111)
Creatinine, Ser: 2.49 mg/dL — ABNORMAL HIGH (ref 0.44–1.00)
GFR calc Af Amer: 20 mL/min — ABNORMAL LOW (ref >60)
GFR calc non Af Amer: 17 mL/min — ABNORMAL LOW (ref >60)
Glucose, Bld: 219 mg/dL — ABNORMAL HIGH (ref 70–99)
Potassium: 4.9 mmol/L (ref 3.5–5.1)
Sodium: 134 mmol/L — ABNORMAL LOW (ref 135–145)
Total Bilirubin: 0.9 mg/dL (ref 0.3–1.2)
Total Protein: 6 g/dL — ABNORMAL LOW (ref 6.5–8.1)

## 2018-12-15 LAB — SARS CORONAVIRUS 2 BY RT PCR (HOSPITAL ORDER, PERFORMED IN ~~LOC~~ HOSPITAL LAB): SARS Coronavirus 2: NEGATIVE

## 2018-12-15 LAB — MAGNESIUM: Magnesium: 1.6 mg/dL — ABNORMAL LOW (ref 1.7–2.4)

## 2018-12-15 LAB — PREPARE RBC (CROSSMATCH)

## 2018-12-15 LAB — SODIUM, URINE, RANDOM: Sodium, Ur: 114 mmol/L

## 2018-12-15 LAB — ABO/RH: ABO/RH(D): A POS

## 2018-12-15 LAB — POC OCCULT BLOOD, ED: Fecal Occult Bld: NEGATIVE

## 2018-12-15 MED ORDER — ONDANSETRON HCL 4 MG PO TABS: 4.0000 mg | ORAL_TABLET | Freq: Four times a day (QID) | ORAL | Status: DC | PRN

## 2018-12-15 MED ORDER — INSULIN ASPART 100 UNIT/ML ~~LOC~~ SOLN
0.0000 [IU] | Freq: Three times a day (TID) | SUBCUTANEOUS | Status: DC
  Administered 2018-12-16: 3 [IU] via SUBCUTANEOUS
  Administered 2018-12-18: 1 [IU] via SUBCUTANEOUS
  Administered 2018-12-18: 2 [IU] via SUBCUTANEOUS

## 2018-12-15 MED ORDER — SODIUM CHLORIDE 0.9% IV SOLUTION: Freq: Once | INTRAVENOUS | Status: DC

## 2018-12-15 MED ORDER — VALACYCLOVIR HCL 500 MG PO TABS: 1000.0000 mg | ORAL_TABLET | Freq: Two times a day (BID) | ORAL | Status: DC

## 2018-12-15 MED ORDER — LEVOTHYROXINE SODIUM 50 MCG PO TABS
50.0000 ug | ORAL_TABLET | Freq: Every day | ORAL | Status: DC
  Administered 2018-12-17 – 2018-12-19 (×3): 50 ug via ORAL
  Filled 2018-12-15 (×4): qty 1

## 2018-12-15 MED ORDER — AMLODIPINE BESYLATE 10 MG PO TABS
10.0000 mg | ORAL_TABLET | Freq: Every day | ORAL | Status: DC
  Administered 2018-12-16 – 2018-12-19 (×4): 10 mg via ORAL
  Filled 2018-12-15 (×4): qty 1

## 2018-12-15 MED ORDER — SODIUM CHLORIDE 0.9% FLUSH: 3.0000 mL | INTRAVENOUS | Status: DC | PRN

## 2018-12-15 MED ORDER — SODIUM CHLORIDE 0.9 % IV SOLN
10.0000 mL/h | Freq: Once | INTRAVENOUS | Status: AC
  Administered 2018-12-15: 17:00:00 10 mL/h via INTRAVENOUS

## 2018-12-15 MED ORDER — SODIUM CHLORIDE 0.9% FLUSH
3.0000 mL | Freq: Two times a day (BID) | INTRAVENOUS | Status: DC
  Administered 2018-12-18 – 2018-12-19 (×2): 3 mL via INTRAVENOUS

## 2018-12-15 MED ORDER — PREDNISONE 50 MG PO TABS
50.0000 mg | ORAL_TABLET | Freq: Every day | ORAL | Status: DC
  Administered 2018-12-16: 50 mg via ORAL
  Filled 2018-12-15: qty 1

## 2018-12-15 MED ORDER — INSULIN ASPART 100 UNIT/ML ~~LOC~~ SOLN: 0.0000 [IU] | Freq: Every day | SUBCUTANEOUS | Status: DC

## 2018-12-15 MED ORDER — SODIUM CHLORIDE 0.9% FLUSH: 3.0000 mL | Freq: Two times a day (BID) | INTRAVENOUS | Status: DC

## 2018-12-15 MED ORDER — ACETAMINOPHEN 650 MG RE SUPP: 650.0000 mg | Freq: Four times a day (QID) | RECTAL | Status: DC | PRN

## 2018-12-15 MED ORDER — ACETAMINOPHEN 325 MG PO TABS
650.0000 mg | ORAL_TABLET | Freq: Four times a day (QID) | ORAL | Status: DC | PRN
  Administered 2018-12-16: 650 mg via ORAL
  Filled 2018-12-15: qty 2

## 2018-12-15 MED ORDER — SODIUM BICARBONATE 650 MG PO TABS
650.0000 mg | ORAL_TABLET | Freq: Two times a day (BID) | ORAL | Status: DC
  Administered 2018-12-16 (×2): 650 mg via ORAL
  Filled 2018-12-15 (×2): qty 1

## 2018-12-15 MED ORDER — ONDANSETRON HCL 4 MG/2ML IJ SOLN: 4.0000 mg | Freq: Four times a day (QID) | INTRAMUSCULAR | Status: DC | PRN

## 2018-12-15 MED ORDER — SODIUM CHLORIDE 0.9 % IV SOLN: 250.0000 mL | INTRAVENOUS | Status: DC | PRN

## 2018-12-15 MED ORDER — ATORVASTATIN CALCIUM 10 MG PO TABS
20.0000 mg | ORAL_TABLET | Freq: Every day | ORAL | Status: DC
  Administered 2018-12-16: 20 mg via ORAL
  Filled 2018-12-15: qty 2

## 2018-12-15 MED ORDER — HYDRALAZINE HCL 50 MG PO TABS
50.0000 mg | ORAL_TABLET | Freq: Three times a day (TID) | ORAL | Status: DC
  Administered 2018-12-16 – 2018-12-19 (×11): 50 mg via ORAL
  Filled 2018-12-15 (×11): qty 1

## 2018-12-15 MED ORDER — MAGNESIUM SULFATE 2 GM/50ML IV SOLN
2.0000 g | Freq: Once | INTRAVENOUS | Status: AC
  Administered 2018-12-16: 2 g via INTRAVENOUS
  Filled 2018-12-15: qty 50

## 2018-12-15 MED ORDER — METOPROLOL SUCCINATE ER 50 MG PO TB24
50.0000 mg | ORAL_TABLET | Freq: Two times a day (BID) | ORAL | Status: DC
  Administered 2018-12-16 – 2018-12-19 (×7): 50 mg via ORAL
  Filled 2018-12-15 (×10): qty 1

## 2018-12-15 NOTE — ED Notes (Signed)
Pt having runs of v tach while this rn in the room. EDP made aware, pt denies any chest pain or palpitations.

## 2018-12-15 NOTE — ED Provider Notes (Signed)
[facility] EMERGENCY DEPARTMENT Provider Note   CSN: [id number] Arrival date & time: 12/15/18  1438    History   Chief Complaint Chief Complaint  Patient presents with  . low Hgb/ sent from MD    HPI Emily Cannon is a 85 y.o. female.  HPI 85 year old female with a history of microscopic polyangiitis on high-dose prednisone and Rituxan as well as Septra DS, hypothyroidism, HTN, HLD presents with low hemoglobin.  Patient states that she had blood work obtained by her PCP and was called and told to come to the ED due to low blood counts.  She states that she has had worsening fatigue over the past couple weeks.  No chest pain or shortness of breath.  No syncopal episodes.  Denies hematemesis or hematochezia.  No melena.  Patient also recent developed a skin rash that involves the left lateral hip, upper thigh, and buttock.  No fevers.  No mucous membrane involvement.  Past Medical History:  Diagnosis Date  . Acquired absence of organ, genital organs   . Allergic rhinitis, cause unspecified   . Anxiety state, unspecified   . Disorder of bone and cartilage, unspecified   . Hypothyroid   . Irritable bowel syndrome   . Lumbago   . Other and unspecified hyperlipidemia   . Perforation of tympanic membrane, unspecified   . Unspecified essential hypertension     Patient Active Problem List   Diagnosis Date Noted  . Symptomatic anemia 12/15/2018  . AKI (acute kidney injury) ([place]) 12/15/2018  . Hypomagnesemia 12/15/2018  . Steroid-induced hyperglycemia 12/15/2018  . Microscopic polyangiitis ([place]) 11/13/2018  . Positive P-ANCA titer 10/14/2018  . Viral URI 05/12/2018  . Syncope 06/05/2016  . Unspecified venous (peripheral) insufficiency 11/23/2012  . Dermatitis 11/10/2012  . Hypothyroidism 05/22/2011  . TYMPANIC MEMBRANE PERFORATION, LEFT EAR 08/08/2007  . Osteopenia 07/27/2007  . Hyperlipidemia 05/11/2007  . Anxiety state 05/11/2007  . Essential  hypertension 05/11/2007  . ALLERGIC RHINITIS 05/11/2007  . Irritable bowel syndrome 05/11/2007  . LOW BACK PAIN 05/11/2007  . HYSTERECTOMY, HX OF 05/11/2007    Past Surgical History:  Procedure Laterality Date  . ABDOMINAL HYSTERECTOMY    . cataract surgery  5/12 and 12/24/2012   both eyes  . TONSILLECTOMY       OB History   No obstetric history on file.      Home Medications    Prior to Admission medications   Medication Sig Start Date End Date Taking? Authorizing Provider  amLODipine (NORVASC) 10 MG tablet TAKE ONE TABLET BY MOUTH DAILY 10/12/18  Yes Burchette, Alinda Sierras, MD  atorvastatin (LIPITOR) 20 MG tablet TAKE ONE TABLET BY MOUTH DAILY 10/12/18  Yes Burchette, Alinda Sierras, MD  Cholecalciferol (VITAMIN D) 50 MCG (2000 UT) CAPS Take 2,000 Units by mouth daily.   Yes [provider]  dapsone 100 MG tablet Take 100 mg by mouth every morning. 11/23/18  Yes [provider]  diphenhydramine-acetaminophen (TYLENOL PM) 25-500 MG TABS Take 1 tablet by mouth at bedtime as needed.     Yes [provider]  furosemide (LASIX) 40 MG tablet Take 40 mg by mouth 3 (three) times a week. Monday, Wed, Friday 12/15/18  Yes [provider]  gemfibrozil (LOPID) 600 MG tablet TAKE ONE TABLET BY MOUTH DAILY 10/12/18  Yes Burchette, Alinda Sierras, MD  hydrALAZINE (APRESOLINE) 50 MG tablet Take 1 tablet (50 mg total) by mouth 2 (two) times daily. Patient taking differently: Take 50 mg by  mouth 3 (three) times daily.  10/07/18  Yes Burchette, Alinda Sierras, MD  levothyroxine (SYNTHROID, LEVOTHROID) 50 MCG tablet TAKE ONE TABLET BY MOUTH EVERY MORNING BEFORE BREAKFAST 10/12/18  Yes Burchette, Alinda Sierras, MD  metoprolol succinate (TOPROL-XL) 50 MG 24 hr tablet TAKE ONE TABLET BY MOUTH TWICE A DAY 09/17/18  Yes Burchette, Alinda Sierras, MD  predniSONE (DELTASONE) 20 MG tablet Take 50 mg by mouth daily with breakfast. Taking 2.5 tablets daily = 50mg  10/28/18  Yes [provider]  sodium  bicarbonate 650 MG tablet Take 650 mg by mouth 2 (two) times daily.   Yes [provider]  spironolactone (ALDACTONE) 25 MG tablet TAKE ONE TABLET BY MOUTH DAILY 10/12/18  Yes Burchette, Alinda Sierras, MD  valACYclovir (VALTREX) 1000 MG tablet Take 1,000 mg by mouth 2 (two) times daily. Duration 5 days Started on 12-01-18 12/01/18   [provider]    Family History Family History  Problem Relation Age of Onset  . Lung cancer Mother 13  . Lung disease Father 35  . COPD Other        sibling  . Colon cancer Neg Hx     Social History Social History   Tobacco Use  . Smoking status: Never Smoker  . Smokeless tobacco: Never Used  Substance Use Topics  . Alcohol use: No  . Drug use: No     Allergies   Alendronate sodium; Amoxicillin; Codeine; and Valsartan   Review of Systems Review of Systems  Constitutional: Negative for chills and fever.  HENT: Negative for ear pain and sore throat.   Eyes: Negative for pain and visual disturbance.  Respiratory: Negative for cough and shortness of breath.   Cardiovascular: Negative for chest pain and palpitations.  Gastrointestinal: Negative for abdominal pain and vomiting.  Genitourinary: Negative for dysuria and hematuria.  Musculoskeletal: Negative for arthralgias and back pain.  Skin: Negative for color change and rash.  Neurological: Positive for light-headedness. Negative for seizures and syncope.  All other systems reviewed and are negative.    Physical Exam Updated Vital Signs BP (!) 169/70   Pulse 65   Temp 97.8 F (36.6 C) (Oral)   Resp 15   Ht 5' (1.524 m)   Wt 59.4 kg   SpO2 94%   BMI 25.58 kg/m   Physical Exam Vitals signs and nursing note reviewed.  Constitutional:      General: She is not in acute distress.    Appearance: She is well-developed.  HENT:     Head: Normocephalic and atraumatic.  Eyes:     Extraocular Movements: Extraocular movements intact.     Conjunctiva/sclera: Conjunctivae  normal.     Pupils: Pupils are equal, round, and reactive to light.  Neck:     Musculoskeletal: Neck supple.  Cardiovascular:     Rate and Rhythm: Normal rate and regular rhythm.     Heart sounds: No murmur.  Pulmonary:     Effort: Pulmonary effort is normal. No respiratory distress.     Breath sounds: Normal breath sounds.  Abdominal:     Palpations: Abdomen is soft.     Tenderness: There is no abdominal tenderness.  Genitourinary:    Comments: Nonbleeding external hemorrhoids No bright red blood per rectum on rectal exam Skin:    General: Skin is warm and dry.     Capillary Refill: Capillary refill takes more than 3 seconds.     Comments: Defined excoriations on the left lateral hip, thigh, and buttock  Neurological:  General: No focal deficit present.     Mental Status: She is alert and oriented to person, place, and time.     Comments: 5 out of 5 strength in bilateral upper and lower extremities      ED Treatments / Results  Labs (all labs ordered are listed, but only abnormal results are displayed) Labs Reviewed  COMPREHENSIVE METABOLIC PANEL - Abnormal; Notable for the following components:      Result Value   Sodium 134 (*)    CO2 19 (*)    Glucose, Bld 219 (*)    BUN 62 (*)    Creatinine, Ser 2.49 (*)    Calcium 8.6 (*)    Total Protein 6.0 (*)    Albumin 3.2 (*)    GFR calc non Af Amer 17 (*)    GFR calc Af Amer 20 (*)    All other components within normal limits  CBC - Abnormal; Notable for the following components:   RBC 1.78 (*)    Hemoglobin 5.5 (*)    HCT 17.5 (*)    RDW 23.9 (*)    nRBC 8.6 (*)    All other components within normal limits  MAGNESIUM - Abnormal; Notable for the following components:   Magnesium 1.6 (*)    All other components within normal limits  SARS CORONAVIRUS 2 (HOSPITAL ORDER, [facility])  SODIUM, URINE, RANDOM  UREA NITROGEN, URINE  CREATININE, URINE, RANDOM  URINALYSIS, COMPLETE (UACMP)  WITH MICROSCOPIC  VITAMIN B12  FOLATE  IRON AND TIBC  FERRITIN  RETICULOCYTES  BASIC METABOLIC PANEL  POC OCCULT BLOOD, ED  TYPE AND SCREEN  PREPARE RBC (CROSSMATCH)  ABO/RH    EKG None  Radiology US Renal  Result Date: 12/15/2018 CLINICAL DATA:  85 year old female with acute renal failure EXAM: RENAL / URINARY TRACT ULTRASOUND COMPLETE COMPARISON:  None. FINDINGS: Right Kidney: Length: 10.6 cm x 4.6 cm x 4.8 cm, 122 cc. Echogenicity within normal limits. No mass or hydronephrosis visualized. Left Kidney: Length: 11.7 cm x 4.9 cm x 5.5 cm, 164 cc. Echogenicity within normal limits. No mass or hydronephrosis visualized. Bladder: Appears normal for degree of bladder distention. IMPRESSION: Negative for hydronephrosis. Unremarkable echogenicity of the kidney parenchyma. Electronically Signed   By: Corrie Mckusick D.O.   On: 12/15/2018 09:03   Dg Chest Portable 1 View  Result Date: 12/15/2018 CLINICAL DATA:  Microscopic polyangiitis. Bilateral lower extremity swelling. Hypertension. EXAM: PORTABLE CHEST 1 VIEW COMPARISON:  CT chest dated 10/22/2018 FINDINGS: The heart is enlarged. There are few linear opacities at the lung bases. No pneumothorax. No large pleural effusion. No acute osseous abnormality. There is an old healed left clavicle fracture. IMPRESSION: 1. Mild stable cardiomegaly. 2. No acute cardiopulmonary process. Previously noted ground-glass airspace opacities are better visualized on prior CT Electronically Signed   By: Constance Holster M.D.   On: 12/15/2018 16:45    Procedures Procedures (including critical care time)  Medications Ordered in ED Medications  magnesium sulfate IVPB 2 g 50 mL (has no administration in time range)  valACYclovir (VALTREX) tablet 1,000 mg (has no administration in time range)  amLODipine (NORVASC) tablet 10 mg (has no administration in time range)  atorvastatin (LIPITOR) tablet 20 mg (has no administration in time range)  hydrALAZINE  (APRESOLINE) tablet 50 mg (has no administration in time range)  metoprolol succinate (TOPROL-XL) 24 hr tablet 50 mg (has no administration in time range)  levothyroxine (SYNTHROID) tablet 50 mcg (has no administration  in time range)  predniSONE (DELTASONE) tablet 50 mg (has no administration in time range)  sodium bicarbonate tablet 650 mg (has no administration in time range)  0.9 %  sodium chloride infusion (Manually program via Guardrails IV Fluids) (has no administration in time range)  sodium chloride flush (NS) 0.9 % injection 3 mL (has no administration in time range)  sodium chloride flush (NS) 0.9 % injection 3 mL (has no administration in time range)  sodium chloride flush (NS) 0.9 % injection 3 mL (has no administration in time range)  0.9 %  sodium chloride infusion (has no administration in time range)  acetaminophen (TYLENOL) tablet 650 mg (has no administration in time range)    Or  acetaminophen (TYLENOL) suppository 650 mg (has no administration in time range)  ondansetron (ZOFRAN) tablet 4 mg (has no administration in time range)    Or  ondansetron (ZOFRAN) injection 4 mg (has no administration in time range)  insulin aspart (novoLOG) injection 0-9 Units (has no administration in time range)  insulin aspart (novoLOG) injection 0-5 Units (has no administration in time range)  0.9 %  sodium chloride infusion (10 mL/hr Intravenous New Bag/Given 12/15/18 1726)     Initial Impression / Assessment and Plan / ED Course  I have reviewed the triage vital signs and the nursing notes.  Pertinent labs & imaging results that were available during my care of the patient were reviewed by me and considered in my medical decision making (see chart for details).  85 year old female with a history of microscopic polyangiitis on high-dose prednisone and Rituxan as well as Septra DS, hypothyroidism, HTN, HLD presents with low hemoglobin.  Hemodynamically stable.  Afebrile.  Patient has a  benign abdominal exam.  She is mentating normally.  No bright red blood per rectum or melena.  Hemoccult negative.  Hemoglobin 5.5.  Will transfuse 2 units.  CMP notable for sodium 134, CO2 19, glucose 219, BUN 62, creatinine 2.49, anion gap 14.  Chest x-ray shows mild stable cardiomegaly.  No acute cardiopulmonary process.  Signs of alveolar hemorrhage.  Patient admitted for further management.  Final Clinical Impressions(s) / ED Diagnoses   Final diagnoses:  None    ED Discharge Orders    None       Trinidad Curet, MD 12/15/18 Leeanne Mannan    Julianne Rice, MD 12/15/18 2257

## 2018-12-15 NOTE — ED Notes (Signed)
ED TO INPATIENT HANDOFF REPORT  ED Nurse Name and Phone #: [facility] [phone]   S Name/Age/Gender Emily Cannon 85 y.o. female Room/Bed: 022C/022C  Code Status   Code Status: Not on file  Home/SNF/Other Home Patient oriented to: self, place, time and situation Is this baseline? Yes   Triage Complete: Triage complete  Chief Complaint Needs blood transfusion  Triage Note Patient sent from Dr. Linnell Fulling office for low Hgb, patient pale on arrival, denies pain. Alert and oriented, states that she has noticed some blood in stool.   Allergies Allergies  Allergen Reactions  . Alendronate Sodium     REACTION: pt states "short of breath"  . Amoxicillin     REACTION: causes severe diarrhea  . Codeine     REACTION: pt states nausea  . Valsartan     REACTION: gums/throat swelling    Level of Care/Admitting Diagnosis ED Disposition    ED Disposition Condition [facility] Area: [place] [[id number]]  Level of Care: Telemetry Medical [104]  I expect the patient will be discharged within 24 hours: Yes  LOW acuity---Tx typically complete <24 hrs---ACUTE conditions typically can be evaluated <24 hours---LABS likely to return to acceptable levels <24 hours---IS near functional baseline---EXPECTED to return to current living arrangement---NOT newly hypoxic: Meets criteria for 5C-Observation unit  Covid Evaluation: Screening Protocol (No Symptoms)  Diagnosis: Symptomatic anemia [0630160]  Admitting Physician: Vianne Bulls [[id number]]  Attending Physician: Vianne Bulls [[id number]]  PT Class (Do Not Modify): Observation [104]  PT Acc Code (Do Not Modify): Observation [10022]       B Medical/Surgery History Past Medical History:  Diagnosis Date  . Acquired absence of organ, genital organs   . Allergic rhinitis, cause unspecified   . Anxiety state, unspecified   . Disorder of bone and cartilage, unspecified   . Hypothyroid   . Irritable bowel  syndrome   . Lumbago   . Other and unspecified hyperlipidemia   . Perforation of tympanic membrane, unspecified   . Unspecified essential hypertension    Past Surgical History:  Procedure Laterality Date  . ABDOMINAL HYSTERECTOMY    . cataract surgery  5/12 and 12/24/2012   both eyes  . TONSILLECTOMY       A IV Location/Drains/Wounds Patient Lines/Drains/Airways Status   Active Line/Drains/Airways    Name:   Placement date:   Placement time:   Site:   Days:   Peripheral IV 12/15/18 Right Antecubital   12/15/18    1539    Antecubital   less than 1          Intake/Output Last 24 hours No intake or output data in the 24 hours ending 12/15/18 1925  Labs/Imaging Results for orders placed or performed during the hospital encounter of 12/15/18 (from the past 48 hour(s))  Comprehensive metabolic panel     Status: Abnormal   Collection Time: 12/15/18  2:52 PM  Result Value Ref Range   Sodium 134 (L) 135 - 145 mmol/L   Potassium 4.9 3.5 - 5.1 mmol/L   Chloride 101 98 - 111 mmol/L   CO2 19 (L) 22 - 32 mmol/L   Glucose, Bld 219 (H) 70 - 99 mg/dL   BUN 62 (H) 8 - 23 mg/dL   Creatinine, Ser 2.49 (H) 0.44 - 1.00 mg/dL   Calcium 8.6 (L) 8.9 - 10.3 mg/dL   Total Protein 6.0 (L) 6.5 - 8.1 g/dL   Albumin 3.2 (L) 3.5 - 5.0 g/dL  AST 32 15 - 41 U/L   ALT 31 0 - 44 U/L   Alkaline Phosphatase 41 38 - 126 U/L   Total Bilirubin 0.9 0.3 - 1.2 mg/dL   GFR calc non Af Amer 17 (L) >60 mL/min   GFR calc Af Amer 20 (L) >60 mL/min   Anion gap 14 5 - 15    Comment: Performed at [facility] [address], [place] [address]  CBC     Status: Abnormal   Collection Time: 12/15/18  2:52 PM  Result Value Ref Range   WBC 10.3 4.0 - 10.5 K/uL   RBC 1.78 (L) 3.87 - 5.11 MIL/uL   Hemoglobin 5.5 (LL) 12.0 - 15.0 g/dL    Comment: REPEATED TO VERIFY THIS CRITICAL RESULT HAS VERIFIED AND BEEN CALLED TO Sabine Tenenbaum,M RN BY AMANDA LEONARD ON 05 19 2020 AT 4034, AND HAS BEEN READ BACK.     HCT  17.5 (L) 36.0 - 46.0 %   MCV 98.3 80.0 - 100.0 fL   MCH 30.9 26.0 - 34.0 pg   MCHC 31.4 30.0 - 36.0 g/dL   RDW 23.9 (H) 11.5 - 15.5 %   Platelets 186 150 - 400 K/uL   nRBC 8.6 (H) 0.0 - 0.2 %    Comment: Performed at [facility] [address] [place], [place] [address]  Type and screen [place]     Status: None (Preliminary result)   Collection Time: 12/15/18  3:10 PM  Result Value Ref Range   ABO/RH(D) A POS    Antibody Screen NEG    Sample Expiration 12/18/2018,2359    Unit Number [id number]    Blood Component Type RED CELLS,LR    Unit division 00    Status of Unit ISSUED    Transfusion Status OK TO TRANSFUSE    Crossmatch Result      Compatible Performed at [facility], [place] [address] [place], [place] [address]   ABO/Rh     Status: None   Collection Time: 12/15/18  3:10 PM  Result Value Ref Range   ABO/RH(D)      A POS Performed at [facility] [address] [place], [place] [address]   Prepare RBC     Status: None   Collection Time: 12/15/18  3:52 PM  Result Value Ref Range   Order Confirmation      ORDER PROCESSED BY BLOOD BANK Performed at [facility], [place] [address] [place], [place] [address]   Magnesium     Status: Abnormal   Collection Time: 12/15/18  5:03 PM  Result Value Ref Range   Magnesium 1.6 (L) 1.7 - 2.4 mg/dL    Comment: Performed at [facility] [address] [place], [place] [address]  SARS Coronavirus 2 (CEPHEID - Performed in [facility] hospital lab), Hosp Order     Status: None   Collection Time: 12/15/18  5:23 PM  Result Value Ref Range   SARS Coronavirus 2 NEGATIVE NEGATIVE    Comment: (NOTE) If result is NEGATIVE SARS-CoV-2 target nucleic acids are NOT DETECTED. The SARS-CoV-2 RNA is generally detectable in upper and lower  respiratory specimens during the acute phase of infection. The lowest  concentration of SARS-CoV-2 viral copies this assay can detect is 250  copies /  mL. A negative result does not preclude SARS-CoV-2 infection  and should not be used as the sole basis for treatment or other  patient management decisions.  A negative result may occur with  improper specimen collection / handling, submission of specimen other  than nasopharyngeal swab, presence of viral mutation(s) within the  areas targeted by this assay, and inadequate number of viral copies  (<250 copies / mL). A negative result must be combined with clinical  observations, patient history, and epidemiological information. If result is POSITIVE SARS-CoV-2 target nucleic acids are DETECTED. The SARS-CoV-2 RNA is generally detectable in upper and lower  respiratory specimens dur ing the acute phase of infection.  Positive  results are indicative of active infection with SARS-CoV-2.  Clinical  correlation with patient history and other diagnostic information is  necessary to determine patient infection status.  Positive results do  not rule out bacterial infection or co-infection with other viruses. If result is PRESUMPTIVE POSTIVE SARS-CoV-2 nucleic acids MAY BE PRESENT.   A presumptive positive result was obtained on the submitted specimen  and confirmed on repeat testing.  While 2019 novel coronavirus  (SARS-CoV-2) nucleic acids may be present in the submitted sample  additional confirmatory testing may be necessary for epidemiological  and / or clinical management purposes  to differentiate between  SARS-CoV-2 and other Sarbecovirus currently known to infect humans.  If clinically indicated additional testing with an alternate test  methodology [phone]) is advised. The SARS-CoV-2 RNA is generally  detectable in upper and lower respiratory sp ecimens during the acute  phase of infection. The expected result is Negative. Fact Sheet for Patients:  StrictlyIdeas.no Fact Sheet for Healthcare Providers: BankingDealers.co.za This test is  not yet approved or cleared by the [place] FDA and has been authorized for detection and/or diagnosis of SARS-CoV-2 by FDA under an Emergency Use Authorization (EUA).  This EUA will remain in effect (meaning this test can be used) for the duration of the COVID-19 declaration under Section 564(b)(1) of the Act, 21 U.S.C. section 360bbb-3(b)(1), unless the authorization is terminated or revoked sooner. Performed at [facility], [place] [address], [place], [place] [address]   POC occult blood, ED     Status: None   Collection Time: 12/15/18  5:42 PM  Result Value Ref Range   Fecal Occult Bld NEGATIVE NEGATIVE   US Renal  Result Date: 12/15/2018 CLINICAL DATA:  85 year old female with acute renal failure EXAM: RENAL / URINARY TRACT ULTRASOUND COMPLETE COMPARISON:  None. FINDINGS: Right Kidney: Length: 10.6 cm x 4.6 cm x 4.8 cm, 122 cc. Echogenicity within normal limits. No mass or hydronephrosis visualized. Left Kidney: Length: 11.7 cm x 4.9 cm x 5.5 cm, 164 cc. Echogenicity within normal limits. No mass or hydronephrosis visualized. Bladder: Appears normal for degree of bladder distention. IMPRESSION: Negative for hydronephrosis. Unremarkable echogenicity of the kidney parenchyma. Electronically Signed   By: Corrie Mckusick D.O.   On: 12/15/2018 09:03   Dg Chest Portable 1 View  Result Date: 12/15/2018 CLINICAL DATA:  Microscopic polyangiitis. Bilateral lower extremity swelling. Hypertension. EXAM: PORTABLE CHEST 1 VIEW COMPARISON:  CT chest dated 10/22/2018 FINDINGS: The heart is enlarged. There are few linear opacities at the lung bases. No pneumothorax. No large pleural effusion. No acute osseous abnormality. There is an old healed left clavicle fracture. IMPRESSION: 1. Mild stable cardiomegaly. 2. No acute cardiopulmonary process. Previously noted ground-glass airspace opacities are better visualized on prior CT Electronically Signed   By: Constance Holster M.D.   On: 12/15/2018 16:45     Pending Labs [employer] (From admission, onward)    Start  Ordered   12/15/18 1923  Urinalysis, Complete w Microscopic  Once,   R     12/15/18 1922   12/15/18 1923  Vitamin B12  (Anemia Panel ([facility]))  Add-on,   R     12/15/18 1922   12/15/18 1923  Folate  (Anemia Panel ([facility]))  Add-on,   R     12/15/18 1922   12/15/18 1923  Iron and TIBC  (Anemia Panel ([facility]))  Add-on,   R     12/15/18 1922   12/15/18 1923  Ferritin  (Anemia Panel ([facility]))  Add-on,   R     12/15/18 1922   12/15/18 1923  Reticulocytes  (Anemia Panel ([facility]))  Add-on,   R     12/15/18 1922   12/15/18 1922  Sodium, urine, random  Once,   R     12/15/18 1922   12/15/18 1922  Urea nitrogen, urine  Add-on,   R     12/15/18 1922   12/15/18 1922  Creatinine, urine, random  Add-on,   R     12/15/18 1922          Vitals/Pain Today's Vitals   12/15/18 1820 12/15/18 1820 12/15/18 1830 12/15/18 1900  BP: (!) 148/73  (!) 162/71 (!) 169/70  Pulse: 77  66 65  Resp: (!) 21  16 15   Temp: 97.8 F (36.6 C)     TempSrc: Oral     SpO2: 93%  93% 94%  Weight:      Height:      PainSc:  0-No pain      Isolation Precautions No active isolations  Medications Medications  magnesium sulfate IVPB 2 g 50 mL (has no administration in time range)  0.9 %  sodium chloride infusion (10 mL/hr Intravenous New Bag/Given 12/15/18 1726)    Mobility walks with person assist Low fall risk   Focused Assessments Cardiac Assessment Handoff:  Cardiac Rhythm: Normal sinus rhythm No results found for: CKTOTAL, CKMB, CKMBINDEX, TROPONINI No results found for: DDIMER Does the Patient currently have chest pain? No      R Recommendations: See Admitting Provider Note  Report given to:   Additional Notes:

## 2018-12-15 NOTE — ED Notes (Signed)
Pt has hx of shingles, scabbed sores noted to her left thigh

## 2018-12-15 NOTE — H&P (Signed)
History and Physical    Emily Cannon YDX:[id number] DOB: August 10, 1932 DOA: 12/15/2018  PCP: Eulas Post, MD   Patient coming from: Home   Chief Complaint: Generalized weakness, fatigue, low Hgb   HPI: Emily Cannon is a 85 y.o. female with medical history significant for hypertension, hypothyroidism, and recent diagnosis of microscopic polyangiitis with rash and kidney injury, now presenting to the emergency department for evaluation of lightheadedness, fatigue, and low hemoglobin on outpatient blood work. Patient developed some mild respiratory symptoms, petechial rash, and generalized weakness over the past few months, underwent extensive outpatient work-up, is ultimately been diagnosed with microscopic polyangiitis and started on Rituxan and prednisone by rheumatology, and now presents with worsening generalized weakness, fatigue, and low hemoglobin on outpatient blood work.  She denies any melena or hematochezia, denies chest pain or palpitations, and denies any fevers or chills.  She has a rash at the left hip and right buttock that was initially attributed to possible zoster, but now felt to be more likely related to her vasculitis.  The rash has scabbed over and is improving.  There is no oral or ocular involvement to the rash and no active vesicles.  She denies any recent change in her chronic mild dyspnea and cough.  She denies hemoptysis.  ED Course: Upon arrival to the ED, patient is found to be afebrile, saturating adequately on room air, and with stable blood pressure.  EKG features a sinus rhythm with IVCD and nonspecific ST abnormality.  Chest x-ray is negative for acute cardiopulmonary disease.  Chemistry panel is notable for BUN of 62 and creatinine of 2.49, up from 1.6 two months ago.  CBC features a normocytic anemia with hemoglobin 5.5, down from 9.2 in March.  Fecal occult blood testing was negative, COVID-19 screening test was negative, 2 units of packed red blood cells  were ordered for immediate transfusion, and the patient will be observed for ongoing evaluation and management.  Review of Systems:  All other systems reviewed and apart from HPI, are negative.  Past Medical History:  Diagnosis Date  . Acquired absence of organ, genital organs   . Allergic rhinitis, cause unspecified   . Anxiety state, unspecified   . Disorder of bone and cartilage, unspecified   . Hypothyroid   . Irritable bowel syndrome   . Lumbago   . Other and unspecified hyperlipidemia   . Perforation of tympanic membrane, unspecified   . Unspecified essential hypertension     Past Surgical History:  Procedure Laterality Date  . ABDOMINAL HYSTERECTOMY    . cataract surgery  5/12 and 12/24/2012   both eyes  . TONSILLECTOMY       reports that she has never smoked. She has never used smokeless tobacco. She reports that she does not drink alcohol or use drugs.  Allergies  Allergen Reactions  . Alendronate Sodium     REACTION: pt states "short of breath"  . Amoxicillin     REACTION: causes severe diarrhea  . Codeine     REACTION: pt states nausea  . Valsartan     REACTION: gums/throat swelling    Family History  Problem Relation Age of Onset  . Lung cancer Mother 50  . Lung disease Father 66  . COPD Other        sibling  . Colon cancer Neg Hx      Prior to Admission medications   Medication Sig Start Date End Date Taking? Authorizing Provider  amLODipine (NORVASC) 10 MG  tablet TAKE ONE TABLET BY MOUTH DAILY 10/12/18  Yes Burchette, Alinda Sierras, MD  atorvastatin (LIPITOR) 20 MG tablet TAKE ONE TABLET BY MOUTH DAILY 10/12/18  Yes Burchette, Alinda Sierras, MD  Cholecalciferol (VITAMIN D) 50 MCG (2000 UT) CAPS Take 2,000 Units by mouth daily.   Yes [provider]  dapsone 100 MG tablet Take 100 mg by mouth every morning. 11/23/18  Yes [provider]  diphenhydramine-acetaminophen (TYLENOL PM) 25-500 MG TABS Take 1 tablet by mouth at bedtime as needed.      Yes [provider]  furosemide (LASIX) 40 MG tablet Take 40 mg by mouth 3 (three) times a week. Monday, Wed, Friday 12/15/18  Yes [provider]  gemfibrozil (LOPID) 600 MG tablet TAKE ONE TABLET BY MOUTH DAILY 10/12/18  Yes Burchette, Alinda Sierras, MD  hydrALAZINE (APRESOLINE) 50 MG tablet Take 1 tablet (50 mg total) by mouth 2 (two) times daily. Patient taking differently: Take 50 mg by mouth 3 (three) times daily.  10/07/18  Yes Burchette, Alinda Sierras, MD  levothyroxine (SYNTHROID, LEVOTHROID) 50 MCG tablet TAKE ONE TABLET BY MOUTH EVERY MORNING BEFORE BREAKFAST 10/12/18  Yes Burchette, Alinda Sierras, MD  metoprolol succinate (TOPROL-XL) 50 MG 24 hr tablet TAKE ONE TABLET BY MOUTH TWICE A DAY 09/17/18  Yes Burchette, Alinda Sierras, MD  predniSONE (DELTASONE) 20 MG tablet Take 50 mg by mouth daily with breakfast. Taking 2.5 tablets daily = 50mg  10/28/18  Yes [provider]  sodium bicarbonate 650 MG tablet Take 650 mg by mouth 2 (two) times daily.   Yes [provider]  spironolactone (ALDACTONE) 25 MG tablet TAKE ONE TABLET BY MOUTH DAILY 10/12/18  Yes Burchette, Alinda Sierras, MD  valACYclovir (VALTREX) 1000 MG tablet Take 1,000 mg by mouth 2 (two) times daily. Duration 5 days Started on 12-01-18 12/01/18   [provider]    Physical Exam: Vitals:   12/15/18 1800 12/15/18 1820 12/15/18 1830 12/15/18 1900  BP: (!) 168/59 (!) 148/73 (!) 162/71 (!) 169/70  Pulse: 76 77 66 65  Resp: (!) 23 (!) 21 16 15   Temp: 98 F (36.7 C) 97.8 F (36.6 C)    TempSrc: Oral Oral    SpO2: 93% 93% 93% 94%  Weight:      Height:        Constitutional: NAD, calm  Eyes: PERTLA, lids and conjunctivae normal ENMT: Mucous membranes are moist. Posterior pharynx clear of any exudate or lesions.   Neck: normal, supple, no masses, no thyromegaly Respiratory: no wheezing, no crackles. No accessory muscle use.  Cardiovascular: S1 & S2 heard, regular rate and rhythm. Pretibial pitting edema bilaterally.  No significant JVD. Abdomen: No distension, no tenderness, soft. Bowel sounds normal.  Musculoskeletal: no clubbing / cyanosis. No joint deformity upper and lower extremities.    Skin: Crusted lesions overly left hip and right buttock without drainage. Warm, dry, well-perfused. Neurologic: CN 2-12 grossly intact. Sensation to light touch intact. Moving all extremities.  Psychiatric: Alert and oriented to person, place, and situation. Pleasant, cooperative.    Labs on Admission: I have personally reviewed following labs and imaging studies  CBC: Recent Labs  Lab 12/15/18 1452  WBC 10.3  HGB 5.5*  HCT 17.5*  MCV 98.3  PLT 326   Basic Metabolic Panel: Recent Labs  Lab 12/15/18 1452 12/15/18 1703  NA 134*  --   K 4.9  --   CL 101  --   CO2 19*  --   GLUCOSE 219*  --  BUN 62*  --   CREATININE 2.49*  --   CALCIUM 8.6*  --   MG  --  1.6*   GFR: Estimated Creatinine Clearance: 13.8 mL/min (A) (by C-G formula based on SCr of 2.49 mg/dL (H)). Liver Function Tests: Recent Labs  Lab 12/15/18 1452  AST 32  ALT 31  ALKPHOS 41  BILITOT 0.9  PROT 6.0*  ALBUMIN 3.2*   No results for input(s): LIPASE, AMYLASE in the last 168 hours. No results for input(s): AMMONIA in the last 168 hours. Coagulation Profile: No results for input(s): INR, PROTIME in the last 168 hours. Cardiac Enzymes: No results for input(s): CKTOTAL, CKMB, CKMBINDEX, TROPONINI in the last 168 hours. BNP (last 3 results) No results for input(s): PROBNP in the last 8760 hours. HbA1C: No results for input(s): HGBA1C in the last 72 hours. CBG: No results for input(s): GLUCAP in the last 168 hours. Lipid Profile: No results for input(s): CHOL, HDL, LDLCALC, TRIG, CHOLHDL, LDLDIRECT in the last 72 hours. Thyroid Function Tests: No results for input(s): TSH, T4TOTAL, FREET4, T3FREE, THYROIDAB in the last 72 hours. Anemia Panel: No results for input(s): VITAMINB12, FOLATE, FERRITIN, TIBC, IRON, RETICCTPCT in  the last 72 hours. Urine analysis:    Component Value Date/Time   COLORURINE YELLOW 10/07/2018 1228   APPEARANCEUR Cloudy (A) 10/07/2018 1228   LABSPEC 1.010 10/07/2018 1228   PHURINE 6.0 10/07/2018 1228   GLUCOSEU NEGATIVE 10/07/2018 1228   HGBUR MODERATE (A) 10/07/2018 1228   BILIRUBINUR NEGATIVE 10/07/2018 1228   KETONESUR NEGATIVE 10/07/2018 1228   UROBILINOGEN 0.2 10/07/2018 1228   NITRITE POSITIVE (A) 10/07/2018 1228   LEUKOCYTESUR SMALL (A) 10/07/2018 1228   Sepsis Labs: @LABRCNTIP (procalcitonin:4,lacticidven:4) ) Recent Results (from the past 240 hour(s))  SARS Coronavirus 2 (CEPHEID - Performed in [place] hospital lab), Hosp Order     Status: None   Collection Time: 12/15/18  5:23 PM  Result Value Ref Range Status   SARS Coronavirus 2 NEGATIVE NEGATIVE Final    Comment: (NOTE) If result is NEGATIVE SARS-CoV-2 target nucleic acids are NOT DETECTED. The SARS-CoV-2 RNA is generally detectable in upper and lower  respiratory specimens during the acute phase of infection. The lowest  concentration of SARS-CoV-2 viral copies this assay can detect is 250  copies / mL. A negative result does not preclude SARS-CoV-2 infection  and should not be used as the sole basis for treatment or other  patient management decisions.  A negative result may occur with  improper specimen collection / handling, submission of specimen other  than nasopharyngeal swab, presence of viral mutation(s) within the  areas targeted by this assay, and inadequate number of viral copies  (<250 copies / mL). A negative result must be combined with clinical  observations, patient history, and epidemiological information. If result is POSITIVE SARS-CoV-2 target nucleic acids are DETECTED. The SARS-CoV-2 RNA is generally detectable in upper and lower  respiratory specimens dur ing the acute phase of infection.  Positive  results are indicative of active infection with SARS-CoV-2.  Clinical  correlation  with patient history and other diagnostic information is  necessary to determine patient infection status.  Positive results do  not rule out bacterial infection or co-infection with other viruses. If result is PRESUMPTIVE POSTIVE SARS-CoV-2 nucleic acids MAY BE PRESENT.   A presumptive positive result was obtained on the submitted specimen  and confirmed on repeat testing.  While 2019 novel coronavirus  (SARS-CoV-2) nucleic acids may be present in the submitted sample  additional confirmatory testing may be necessary for epidemiological  and / or clinical management purposes  to differentiate between  SARS-CoV-2 and other Sarbecovirus currently known to infect humans.  If clinically indicated additional testing with an alternate test  methodology [phone]) is advised. The SARS-CoV-2 RNA is generally  detectable in upper and lower respiratory sp ecimens during the acute  phase of infection. The expected result is Negative. Fact Sheet for Patients:  StrictlyIdeas.no Fact Sheet for Healthcare Providers: BankingDealers.co.za This test is not yet approved or cleared by the [place] FDA and has been authorized for detection and/or diagnosis of SARS-CoV-2 by FDA under an Emergency Use Authorization (EUA).  This EUA will remain in effect (meaning this test can be used) for the duration of the COVID-19 declaration under Section 564(b)(1) of the Act, 21 U.S.C. section 360bbb-3(b)(1), unless the authorization is terminated or revoked sooner. Performed at [facility], [place] [address] [place], [place] [address]      Radiological Exams on Admission: US Renal  Result Date: 12/15/2018 CLINICAL DATA:  85 year old female with acute renal failure EXAM: RENAL / URINARY TRACT ULTRASOUND COMPLETE COMPARISON:  None. FINDINGS: Right Kidney: Length: 10.6 cm x 4.6 cm x 4.8 cm, 122 cc. Echogenicity within normal limits. No mass or hydronephrosis  visualized. Left Kidney: Length: 11.7 cm x 4.9 cm x 5.5 cm, 164 cc. Echogenicity within normal limits. No mass or hydronephrosis visualized. Bladder: Appears normal for degree of bladder distention. IMPRESSION: Negative for hydronephrosis. Unremarkable echogenicity of the kidney parenchyma. Electronically Signed   By: Corrie Mckusick D.O.   On: 12/15/2018 09:03   Dg Chest Portable 1 View  Result Date: 12/15/2018 CLINICAL DATA:  Microscopic polyangiitis. Bilateral lower extremity swelling. Hypertension. EXAM: PORTABLE CHEST 1 VIEW COMPARISON:  CT chest dated 10/22/2018 FINDINGS: The heart is enlarged. There are few linear opacities at the lung bases. No pneumothorax. No large pleural effusion. No acute osseous abnormality. There is an old healed left clavicle fracture. IMPRESSION: 1. Mild stable cardiomegaly. 2. No acute cardiopulmonary process. Previously noted ground-glass airspace opacities are better visualized on prior CT Electronically Signed   By: Constance Holster M.D.   On: 12/15/2018 16:45    EKG: Independently reviewed. Sinus rhythm, IVCD with non-specific ST abnormality.   Assessment/Plan   1. Symptomatic anemia  - Presents with progressive fatigue and low Hgb on outpatient labs  - Hgb is 5.5 in ED, down from 9.2 in March  - She denies melena, hematochezia, or hemoptysis and FOBT is negative  - Check anemia panel, LDH, haptoglobin  - Hold dapsone  - Check post-transfusion CBC    2. AKI  - SCr is 2.49 in ED, up from 1.6 in March and 0.9 in February  - She had renal US 12/14/18 with no hydronephrosis and unremarkable echogenicity  - Check urine chemistries, hold diuretics, renally-dose medications, repeat chem panel in am    3. Microscopic polyangiitis  - Recently diagnosed and started on Rituxan and prednisone  - Rash is improving, respiratory status stable, renal function worse as above   - Continue prednisone, continue rheumatology follow-up    4. Hyperglycemia  - Serum  glucose 219 in ED  - No hx of DM, likely secondary to prednisone  - Monitor CBGs and use a low-intensity SSI with Novolog for now    5. Hypertension  - Continue Norvasc, hydralazine, and metoprolol as tolerated    6. Hypothyroidism  - Continue Synthroid    7. Rash  - Improving,  lesions are crusted and not draining  - Hold dapsone given symptomatic anemia without GI bleeding as above    PPE: Mask, face shield  DVT prophylaxis: SCD's  Code Status: Full  Family Communication: Discussed with patient  Consults called: None Admission status: Observation     Vianne Bulls, MD [facility] Pager [phone]  If 7PM-7AM, please contact night-coverage [email or web address] Password [facility]  12/15/2018, 7:34 PM

## 2018-12-15 NOTE — ED Notes (Signed)
MD notified of hgb by taylor, RN

## 2018-12-15 NOTE — ED Notes (Signed)
Pr blood instructions given and pt verbalizes understanding

## 2018-12-15 NOTE — ED Triage Notes (Signed)
Patient sent from Dr. Linnell Fulling office for low Hgb, patient pale on arrival, denies pain. Alert and oriented, states that she has noticed some blood in stool.

## 2018-12-15 NOTE — ED Notes (Signed)
Pt oob to bathroom with steady gait. 

## 2018-12-15 NOTE — ED Notes (Signed)
Nurse Navigator communication: The patient has a cell phone at bedside and is able to give updates to family. Primary RN has also spoken with family.

## 2018-12-15 NOTE — ED Notes (Signed)
ED Provider at bedside. 

## 2018-12-15 NOTE — ED Notes (Signed)
Pt talking with family on room phone

## 2018-12-15 NOTE — ED Notes (Signed)
Iv attempted without success. 

## 2018-12-15 NOTE — ED Notes (Signed)
Per discussion with Dr Myna Hidalgo, patient does not need negative pressure room, patient does not have shingles.  AC notified and primary RN in ED notified.

## 2018-12-16 DIAGNOSIS — D631 Anemia in chronic kidney disease: Secondary | ICD-10-CM | POA: Diagnosis not present

## 2018-12-16 DIAGNOSIS — E8770 Fluid overload, unspecified: Secondary | ICD-10-CM | POA: Diagnosis present

## 2018-12-16 DIAGNOSIS — N189 Chronic kidney disease, unspecified: Secondary | ICD-10-CM | POA: Diagnosis not present

## 2018-12-16 DIAGNOSIS — D649 Anemia, unspecified: Secondary | ICD-10-CM | POA: Diagnosis not present

## 2018-12-16 DIAGNOSIS — Z6824 Body mass index (BMI) 24.0-24.9, adult: Secondary | ICD-10-CM | POA: Diagnosis not present

## 2018-12-16 DIAGNOSIS — M317 Microscopic polyangiitis: Secondary | ICD-10-CM | POA: Diagnosis present

## 2018-12-16 DIAGNOSIS — Z7952 Long term (current) use of systemic steroids: Secondary | ICD-10-CM | POA: Diagnosis not present

## 2018-12-16 DIAGNOSIS — Z90710 Acquired absence of both cervix and uterus: Secondary | ICD-10-CM | POA: Diagnosis not present

## 2018-12-16 DIAGNOSIS — Z79890 Hormone replacement therapy (postmenopausal): Secondary | ICD-10-CM | POA: Diagnosis not present

## 2018-12-16 DIAGNOSIS — I129 Hypertensive chronic kidney disease with stage 1 through stage 4 chronic kidney disease, or unspecified chronic kidney disease: Secondary | ICD-10-CM | POA: Diagnosis not present

## 2018-12-16 DIAGNOSIS — E039 Hypothyroidism, unspecified: Secondary | ICD-10-CM | POA: Diagnosis present

## 2018-12-16 DIAGNOSIS — L309 Dermatitis, unspecified: Secondary | ICD-10-CM | POA: Diagnosis present

## 2018-12-16 DIAGNOSIS — Z801 Family history of malignant neoplasm of trachea, bronchus and lung: Secondary | ICD-10-CM | POA: Diagnosis not present

## 2018-12-16 DIAGNOSIS — N185 Chronic kidney disease, stage 5: Secondary | ICD-10-CM | POA: Diagnosis present

## 2018-12-16 DIAGNOSIS — N179 Acute kidney failure, unspecified: Secondary | ICD-10-CM | POA: Diagnosis present

## 2018-12-16 DIAGNOSIS — E0922 Drug or chemical induced diabetes mellitus with diabetic chronic kidney disease: Secondary | ICD-10-CM | POA: Diagnosis present

## 2018-12-16 DIAGNOSIS — I120 Hypertensive chronic kidney disease with stage 5 chronic kidney disease or end stage renal disease: Secondary | ICD-10-CM | POA: Diagnosis present

## 2018-12-16 DIAGNOSIS — T380X5A Adverse effect of glucocorticoids and synthetic analogues, initial encounter: Secondary | ICD-10-CM | POA: Diagnosis present

## 2018-12-16 DIAGNOSIS — E876 Hypokalemia: Secondary | ICD-10-CM | POA: Diagnosis present

## 2018-12-16 DIAGNOSIS — E0965 Drug or chemical induced diabetes mellitus with hyperglycemia: Secondary | ICD-10-CM | POA: Diagnosis present

## 2018-12-16 DIAGNOSIS — Z1159 Encounter for screening for other viral diseases: Secondary | ICD-10-CM | POA: Diagnosis not present

## 2018-12-16 DIAGNOSIS — R627 Adult failure to thrive: Secondary | ICD-10-CM | POA: Diagnosis present

## 2018-12-16 DIAGNOSIS — Z79899 Other long term (current) drug therapy: Secondary | ICD-10-CM | POA: Diagnosis not present

## 2018-12-16 DIAGNOSIS — E785 Hyperlipidemia, unspecified: Secondary | ICD-10-CM | POA: Diagnosis present

## 2018-12-16 DIAGNOSIS — Z825 Family history of asthma and other chronic lower respiratory diseases: Secondary | ICD-10-CM | POA: Diagnosis not present

## 2018-12-16 LAB — URINALYSIS, ROUTINE W REFLEX MICROSCOPIC
Bilirubin Urine: NEGATIVE
Glucose, UA: NEGATIVE mg/dL
Ketones, ur: NEGATIVE mg/dL
Nitrite: NEGATIVE
Protein, ur: 100 mg/dL — AB
RBC / HPF: >50 RBC/hpf — ABNORMAL HIGH (ref 0–5)
Specific Gravity, Urine: 1.009 (ref 1.005–1.030)
WBC, UA: >50 WBC/hpf — ABNORMAL HIGH (ref 0–5)
pH: 6 (ref 5.0–8.0)

## 2018-12-16 LAB — BASIC METABOLIC PANEL
Anion gap: 11 (ref 5–15)
BUN: 62 mg/dL — ABNORMAL HIGH (ref 8–23)
CO2: 24 mmol/L (ref 22–32)
Calcium: 8.7 mg/dL — ABNORMAL LOW (ref 8.9–10.3)
Chloride: 102 mmol/L (ref 98–111)
Creatinine, Ser: 2.41 mg/dL — ABNORMAL HIGH (ref 0.44–1.00)
GFR calc Af Amer: 21 mL/min — ABNORMAL LOW (ref >60)
GFR calc non Af Amer: 18 mL/min — ABNORMAL LOW (ref >60)
Glucose, Bld: 90 mg/dL (ref 70–99)
Potassium: 4.2 mmol/L (ref 3.5–5.1)
Sodium: 137 mmol/L (ref 135–145)

## 2018-12-16 LAB — RETICULOCYTES
Immature Retic Fract: 25.7 % — ABNORMAL HIGH (ref 2.3–15.9)
RBC.: 1.83 MIL/uL — ABNORMAL LOW (ref 3.87–5.11)
Retic Count, Absolute: 142.9 10*3/uL (ref 19.0–186.0)
Retic Ct Pct: 7.8 % — ABNORMAL HIGH (ref 0.4–3.1)

## 2018-12-16 LAB — CBC
HCT: 29 % — ABNORMAL LOW (ref 36.0–46.0)
Hemoglobin: 10 g/dL — ABNORMAL LOW (ref 12.0–15.0)
MCH: 30.9 pg (ref 26.0–34.0)
MCHC: 34.5 g/dL (ref 30.0–36.0)
MCV: 89.5 fL (ref 80.0–100.0)
Platelets: 129 10*3/uL — ABNORMAL LOW (ref 150–400)
RBC: 3.24 MIL/uL — ABNORMAL LOW (ref 3.87–5.11)
RDW: 18.8 % — ABNORMAL HIGH (ref 11.5–15.5)
WBC: 8.3 10*3/uL (ref 4.0–10.5)
nRBC: 10.5 % — ABNORMAL HIGH (ref 0.0–0.2)

## 2018-12-16 LAB — GLUCOSE, CAPILLARY
Glucose-Capillary: 76 mg/dL (ref 70–99)
Glucose-Capillary: 72 mg/dL (ref 70–99)
Glucose-Capillary: 103 mg/dL — ABNORMAL HIGH (ref 70–99)
Glucose-Capillary: 232 mg/dL — ABNORMAL HIGH (ref 70–99)
Glucose-Capillary: 144 mg/dL — ABNORMAL HIGH (ref 70–99)

## 2018-12-16 LAB — VITAMIN B12: Vitamin B-12: 444 pg/mL (ref 180–914)

## 2018-12-16 LAB — CREATININE, URINE, RANDOM: Creatinine, Urine: 23.22 mg/dL

## 2018-12-16 LAB — IRON AND TIBC
Iron: 110 ug/dL (ref 28–170)
Saturation Ratios: 24 % (ref 10.4–31.8)
TIBC: 449 ug/dL (ref 250–450)
UIBC: 339 ug/dL

## 2018-12-16 LAB — FERRITIN: Ferritin: 444 ng/mL — ABNORMAL HIGH (ref 11–307)

## 2018-12-16 LAB — SODIUM, URINE, RANDOM: Sodium, Ur: 106 mmol/L

## 2018-12-16 LAB — FOLATE: Folate: 46.2 ng/mL (ref >5.9)

## 2018-12-16 LAB — LACTATE DEHYDROGENASE: LDH: 507 U/L — ABNORMAL HIGH (ref 98–192)

## 2018-12-16 MED ORDER — SODIUM CHLORIDE 0.9 % IV SOLN
INTRAVENOUS | Status: DC
  Administered 2018-12-16 – 2018-12-17 (×3): via INTRAVENOUS

## 2018-12-16 NOTE — Progress Notes (Signed)
[facility] PROGRESS NOTE  Emily Cannon EML:[id number] DOB: 03-Jan-1933 DOA: 12/15/2018 PCP: Eulas Post, MD   Narrative: 85-year-old female History of polyangiitis on Rituxan/prednisone, HTN, hypothyroid, steroid-induced diabetes mellitus, underlying CKD Admit 12/15/2018 symptomatic anemia in the setting of progressive fatigue-hemoglobin 5.5 down from 9.2 prior -Also noted to AKI from creatinine 1.6-->2.4   A & Plan Symptomatic anemia--?Hemolytic? Iron studies not suggestive IRon def Hb up after 2 U PRBC-->10 > 2/2 Rituxan?--no overt bleed LDH is 507--obtain Uric acid as well as Hapto AKI Hypomagnesemia Rpt UA stat, Renal US neg for pathology Husband states unclear if she was taking Septra? Get FeNa Start Saline 100 cc/h, Replaced Mag IV today with 2GM If no resolution will d/w Nephro As Bicarb has improved from admit-will hold Na2co3 tablets Microscopic polyangiitis  Holding Rituxan/steroids-monitor--Need F/u Dr. Amil Amen Steroid-induced hyperglycemia Holding steroids at this time CBG consistently 70's today HTN Cont amlodipine 10 daily, metoprolol 50 bid Monitor trends overnight Hypothyroid Cont synthroid 50 mcg q am Needs TSH 3 weeks Adult failure to thrive, BMI 24 Hyperlipidemia  Hold statin--some evidence can be implicatedin AKI    DVT loveneox  Code Status: full  Communication: d/w husband on fone  Disposition Plan: inpatient--needs another rmidnight minimum for work-up of AKI    Marvin Grabill, MD  [facility] Via [employer] app OR [email or web address] 7PM-7AM contact night coverage as above 12/16/2018, 7:32 AM  LOS: 0 days   Consultants:  None  Procedures:  Transfusion PRBC X2 5/19  Antimicrobials:  None  Interval history/Subjective:  Awake pleasant tremulous-tells me tremors started after starting "new meds" No cp No fever Nursing reports her side and thigh are occ weepy---NO indicaiton for Contact precautions  Objective:  Vitals:   Vitals:   12/16/18 0220 12/16/18 0515  BP: (!) 147/80 (!) 169/75  Pulse: 77 65  Resp: 18 19  Temp: (!) 97.4 F (36.3 C) 97.9 F (36.6 C)  SpO2: 95% 93%    Exam:   frail but pleasant-younger than stated age, no ict, slight fullness to neck [?] no jvd no thyromegaly s1 s 2no m/r/gpatient is non tele cta b no added sound abd soft nt nd no rebound no guard No le edema Neuro intact   I have personally reviewed the following:  DATA   Labs:  Creat 2.49--->2.42  Bicarb 24  Hemoglobin 5.5--->10   Decision to obtain old records:  yes  Review and summation of old records:  Yes reviewed  Scheduled Meds: . sodium chloride   Intravenous Once  . amLODipine  10 mg Oral Daily  . hydrALAZINE  50 mg Oral TID  . insulin aspart  0-5 Units Subcutaneous QHS  . insulin aspart  0-9 Units Subcutaneous TID WC  . levothyroxine  50 mcg Oral QAC breakfast  . metoprolol succinate  50 mg Oral BID  . sodium chloride flush  3 mL Intravenous Q12H   Continuous Infusions: . sodium chloride      Principal Problem:   Symptomatic anemia Active Problems:   Essential hypertension   Hypothyroidism   Dermatitis   Microscopic polyangiitis (HCC)   AKI (acute kidney injury) ([place])   Hypomagnesemia   Steroid-induced hyperglycemia   LOS: 0 days

## 2018-12-17 LAB — TYPE AND SCREEN
ABO/RH(D): A POS
Antibody Screen: NEGATIVE
Unit division: 0
Unit division: 0

## 2018-12-17 LAB — BPAM RBC
Blood Product Expiration Date: 202005262359
Blood Product Expiration Date: 202005272359
ISSUE DATE / TIME: 202005191748
ISSUE DATE / TIME: 202005200138
Unit Type and Rh: 6200
Unit Type and Rh: 6200

## 2018-12-17 LAB — RENAL FUNCTION PANEL
Albumin: 2.5 g/dL — ABNORMAL LOW (ref 3.5–5.0)
Anion gap: 9 (ref 5–15)
BUN: 62 mg/dL — ABNORMAL HIGH (ref 8–23)
CO2: 22 mmol/L (ref 22–32)
Calcium: 8 mg/dL — ABNORMAL LOW (ref 8.9–10.3)
Chloride: 105 mmol/L (ref 98–111)
Creatinine, Ser: 2.63 mg/dL — ABNORMAL HIGH (ref 0.44–1.00)
GFR calc Af Amer: 19 mL/min — ABNORMAL LOW (ref >60)
GFR calc non Af Amer: 16 mL/min — ABNORMAL LOW (ref >60)
Glucose, Bld: 93 mg/dL (ref 70–99)
Phosphorus: 4 mg/dL (ref 2.5–4.6)
Potassium: 4.4 mmol/L (ref 3.5–5.1)
Sodium: 136 mmol/L (ref 135–145)

## 2018-12-17 LAB — CBC WITH DIFFERENTIAL/PLATELET
Abs Immature Granulocytes: 0.32 10*3/uL — ABNORMAL HIGH (ref 0.00–0.07)
Basophils Absolute: 0 10*3/uL (ref 0.0–0.1)
Basophils Relative: 0 %
Eosinophils Absolute: 0 10*3/uL (ref 0.0–0.5)
Eosinophils Relative: 0 %
HCT: 26.4 % — ABNORMAL LOW (ref 36.0–46.0)
Hemoglobin: 9 g/dL — ABNORMAL LOW (ref 12.0–15.0)
Immature Granulocytes: 5 %
Lymphocytes Relative: 7 %
Lymphs Abs: 0.5 10*3/uL — ABNORMAL LOW (ref 0.7–4.0)
MCH: 30.9 pg (ref 26.0–34.0)
MCHC: 34.1 g/dL (ref 30.0–36.0)
MCV: 90.7 fL (ref 80.0–100.0)
Monocytes Absolute: 0.3 10*3/uL (ref 0.1–1.0)
Monocytes Relative: 5 %
Neutro Abs: 5.7 10*3/uL (ref 1.7–7.7)
Neutrophils Relative %: 83 %
Platelets: 127 10*3/uL — ABNORMAL LOW (ref 150–400)
RBC: 2.91 MIL/uL — ABNORMAL LOW (ref 3.87–5.11)
RDW: 20.3 % — ABNORMAL HIGH (ref 11.5–15.5)
WBC: 6.9 10*3/uL (ref 4.0–10.5)
nRBC: 8.4 % — ABNORMAL HIGH (ref 0.0–0.2)

## 2018-12-17 LAB — URIC ACID: Uric Acid, Serum: 6.5 mg/dL (ref 2.5–7.1)

## 2018-12-17 LAB — URINALYSIS, ROUTINE W REFLEX MICROSCOPIC
Bilirubin Urine: NEGATIVE
Glucose, UA: 50 mg/dL — AB
Ketones, ur: NEGATIVE mg/dL
Nitrite: NEGATIVE
Protein, ur: 100 mg/dL — AB
RBC / HPF: >50 RBC/hpf — ABNORMAL HIGH (ref 0–5)
Specific Gravity, Urine: 1.009 (ref 1.005–1.030)
WBC, UA: >50 WBC/hpf — ABNORMAL HIGH (ref 0–5)
pH: 6 (ref 5.0–8.0)

## 2018-12-17 LAB — GLUCOSE, CAPILLARY
Glucose-Capillary: 74 mg/dL (ref 70–99)
Glucose-Capillary: 118 mg/dL — ABNORMAL HIGH (ref 70–99)
Glucose-Capillary: 84 mg/dL (ref 70–99)
Glucose-Capillary: 183 mg/dL — ABNORMAL HIGH (ref 70–99)

## 2018-12-17 LAB — HAPTOGLOBIN: Haptoglobin: <10 mg/dL — ABNORMAL LOW (ref 41–333)

## 2018-12-17 LAB — UREA NITROGEN, URINE: Urea Nitrogen, Ur: 231 mg/dL

## 2018-12-17 MED ORDER — PREDNISONE 50 MG PO TABS
60.0000 mg | ORAL_TABLET | Freq: Every day | ORAL | Status: DC
  Administered 2018-12-17 – 2018-12-19 (×3): 60 mg via ORAL
  Filled 2018-12-17 (×3): qty 1

## 2018-12-17 MED ORDER — PANTOPRAZOLE SODIUM 20 MG PO TBEC
20.0000 mg | DELAYED_RELEASE_TABLET | Freq: Every day | ORAL | Status: DC
  Administered 2018-12-17 – 2018-12-19 (×3): 20 mg via ORAL
  Filled 2018-12-17 (×3): qty 1

## 2018-12-17 NOTE — Progress Notes (Addendum)
[facility] PROGRESS NOTE Emily Cannon PXT:[id number] DOB: 11/29/1932 DOA: 12/15/2018 PCP: Eulas Post, MD Narrative:  85 year old female History of polyangiitis on Rituxan/prednisone, HTN, hypothyroid, steroid-induced diabetes mellitus, underlying CKD Admit 12/15/2018 symptomatic anemia in the setting of progressive fatigue-hemoglobin 5.5 down from 9.2 prior -Also noted to AKI from creatinine 1.6-->2.4 Work-up including renal ultrasound in addition to work-up for anemia was not suggestive of occult GI bleed   A & Plan Symptomatic anemia--?Hemolytic? Iron studies not suggestive Iron def Hb up after 2 U PRBC was appropriate > 2/2 Rituxan?--no overt bleeding noted since admission LDH is 507--await further labs to determine if this is indeed hemolytic May need to discuss with nephrology/oncology going forward AKI Hypomagnesemia Urine analysis showed large leukocytes large hemoglobin and 100 of protein but difficult to determine if this is real I will repeat UA now that she is catheterized Renal US neg patient showed this and unremarkable echogenicity Husband states unclear if she was taking Septra? FENa is 8.0 which suggests postobstructive uropathy-Foley catheter placed 5/21 Continue saline as above Repeat labs a.m. Microscopic polyangiitis  Holding Rituxan/steroids-monitor--Need F/u Dr. Amil Amen Steroid-induced hyperglycemia Resuming steroids at 60 mg prednisone CBG consistently 70's today HTN Cont amlodipine 10 daily, metoprolol 50 bid Monitor trends overnight Hypothyroid Cont synthroid 50 mcg q am Needs TSH 3 weeks Adult failure to thrive, BMI 24 Hyperlipidemia  Hold statin--some evidence can be implicated in AKI  Addend:-Discussed with patient's primary nephrologist who contacted me earlier today She requests that the patient be restarted on prednisone and we have also in addition started back GI prophylaxis with low-dose Protonix She reports to me that  kidney function had been declining and most recent labs may be a week and a half prior to admission showed a creatinine in the 2 range Under surveillance by both her and Dr. Amil Amen and we will ensure that they are both copied on notes in the outpatient setting  DVT lovenox  Code Status: full   Communication: Called Son HCPOA and Dad Disposition Plan: inpatient--needs another rmidnight minimum for work-up of AKI-AKI is not improved we had to place a Foley I may be consulting renal or oncology in the morning if labs are no better   Verlon Au, MD  [facility] Via [employer] app OR -[email or web address] 7PM-7AM contact night coverage as above 12/17/2018, 9:48 AM  LOS: 1 day   Consultants:  None  Procedures:  Transfusion PRBC X2 5/19  Antimicrobials:  None  Interval history/Subjective:  Awake pleasant oriented but states she has not gotten out of bed States that her sacral area is raw and somewhat painful Wonders about the plan and if she is going home  Objective:  Vitals:  Vitals:   12/17/18 0511 12/17/18 0512  BP: (!) 179/140 (!) 168/69  Pulse: 77 66  Resp: 17   Temp: 98.1 F (36.7 C)   SpO2: 93%     Exam:  Right leg cushingoid?-younger than stated age, no ict s1 s 2no m/r/g cta b no added sound abd soft nt nd no rebound no guard No le edema Wounds reviewed and show scabbing in the left buttocks left hip and patient has a raw sacral decubitus 4 x 5 cm Neuro intact   I have personally reviewed the following:  DATA   Labs:  Creat 2.49--->2.42-->2.6  Bicarb 24  Hemoglobin 5.5--->10-->9.0   Decision to obtain old records:  yes  Review and summation of old records:  Yes reviewed  Scheduled Meds: . sodium chloride  Intravenous Once  . amLODipine  10 mg Oral Daily  . hydrALAZINE  50 mg Oral TID  . insulin aspart  0-5 Units Subcutaneous QHS  . insulin aspart  0-9 Units Subcutaneous TID WC  . levothyroxine  50 mcg Oral QAC breakfast  . metoprolol  succinate  50 mg Oral BID  . sodium chloride flush  3 mL Intravenous Q12H   Continuous Infusions: . sodium chloride 100 mL/hr at 12/17/18 0130    Principal Problem:   Symptomatic anemia Active Problems:   Essential hypertension   Hypothyroidism   Dermatitis   Microscopic polyangiitis (HCC)   AKI (acute kidney injury) ([place])   Hypomagnesemia   Steroid-induced hyperglycemia   LOS: 1 day

## 2018-12-17 NOTE — Progress Notes (Signed)
PVR from bladder scan was 275. Indwelling foley catheter inserted per Dr. Verlon Au.

## 2018-12-18 ENCOUNTER — Telehealth: Payer: Self-pay | Admitting: *Deleted

## 2018-12-18 LAB — RENAL FUNCTION PANEL
Albumin: 2.3 g/dL — ABNORMAL LOW (ref 3.5–5.0)
Anion gap: 9 (ref 5–15)
BUN: 57 mg/dL — ABNORMAL HIGH (ref 8–23)
CO2: 19 mmol/L — ABNORMAL LOW (ref 22–32)
Calcium: 7.7 mg/dL — ABNORMAL LOW (ref 8.9–10.3)
Chloride: 107 mmol/L (ref 98–111)
Creatinine, Ser: 2.28 mg/dL — ABNORMAL HIGH (ref 0.44–1.00)
GFR calc Af Amer: 22 mL/min — ABNORMAL LOW (ref >60)
GFR calc non Af Amer: 19 mL/min — ABNORMAL LOW (ref >60)
Glucose, Bld: 123 mg/dL — ABNORMAL HIGH (ref 70–99)
Phosphorus: 4.8 mg/dL — ABNORMAL HIGH (ref 2.5–4.6)
Potassium: 4.2 mmol/L (ref 3.5–5.1)
Sodium: 135 mmol/L (ref 135–145)

## 2018-12-18 LAB — HAPTOGLOBIN: Haptoglobin: <10 mg/dL — ABNORMAL LOW (ref 41–333)

## 2018-12-18 LAB — CBC WITH DIFFERENTIAL/PLATELET
Abs Immature Granulocytes: 0.24 10*3/uL — ABNORMAL HIGH (ref 0.00–0.07)
Basophils Absolute: 0 10*3/uL (ref 0.0–0.1)
Basophils Relative: 0 %
Eosinophils Absolute: 0 10*3/uL (ref 0.0–0.5)
Eosinophils Relative: 0 %
HCT: 26.6 % — ABNORMAL LOW (ref 36.0–46.0)
Hemoglobin: 8.8 g/dL — ABNORMAL LOW (ref 12.0–15.0)
Immature Granulocytes: 3 %
Lymphocytes Relative: 7 %
Lymphs Abs: 0.5 10*3/uL — ABNORMAL LOW (ref 0.7–4.0)
MCH: 30.1 pg (ref 26.0–34.0)
MCHC: 33.1 g/dL (ref 30.0–36.0)
MCV: 91.1 fL (ref 80.0–100.0)
Monocytes Absolute: 0.2 10*3/uL (ref 0.1–1.0)
Monocytes Relative: 2 %
Neutro Abs: 6.3 10*3/uL (ref 1.7–7.7)
Neutrophils Relative %: 88 %
Platelets: 118 10*3/uL — ABNORMAL LOW (ref 150–400)
RBC: 2.92 MIL/uL — ABNORMAL LOW (ref 3.87–5.11)
RDW: 20.6 % — ABNORMAL HIGH (ref 11.5–15.5)
WBC: 7.2 10*3/uL (ref 4.0–10.5)
nRBC: 2.8 % — ABNORMAL HIGH (ref 0.0–0.2)

## 2018-12-18 LAB — GLUCOSE, CAPILLARY
Glucose-Capillary: 106 mg/dL — ABNORMAL HIGH (ref 70–99)
Glucose-Capillary: 150 mg/dL — ABNORMAL HIGH (ref 70–99)
Glucose-Capillary: 189 mg/dL — ABNORMAL HIGH (ref 70–99)
Glucose-Capillary: 149 mg/dL — ABNORMAL HIGH (ref 70–99)

## 2018-12-18 MED ORDER — PANTOPRAZOLE SODIUM 20 MG PO TBEC: 20.0000 mg | DELAYED_RELEASE_TABLET | Freq: Every day | ORAL | 0 refills | Status: DC

## 2018-12-18 MED ORDER — HYDRALAZINE HCL 50 MG PO TABS: 50.0000 mg | ORAL_TABLET | Freq: Three times a day (TID) | ORAL | Status: DC

## 2018-12-18 MED ORDER — DARBEPOETIN ALFA 100 MCG/0.5ML IJ SOSY
100.0000 ug | PREFILLED_SYRINGE | Freq: Once | INTRAMUSCULAR | Status: AC
  Administered 2018-12-18: 100 ug via SUBCUTANEOUS
  Filled 2018-12-18: qty 0.5

## 2018-12-18 MED ORDER — FUROSEMIDE 10 MG/ML IJ SOLN
80.0000 mg | Freq: Every day | INTRAMUSCULAR | Status: AC
  Administered 2018-12-18 – 2018-12-19 (×2): 80 mg via INTRAVENOUS
  Filled 2018-12-18 (×2): qty 8

## 2018-12-18 MED ORDER — GERHARDT'S BUTT CREAM
TOPICAL_CREAM | Freq: Three times a day (TID) | CUTANEOUS | Status: DC
  Administered 2018-12-18 – 2018-12-19 (×3): via TOPICAL
  Filled 2018-12-18: qty 1

## 2018-12-18 MED ORDER — FUROSEMIDE 40 MG PO TABS: 20.0000 mg | ORAL_TABLET | ORAL | Status: AC

## 2018-12-18 MED ORDER — GERHARDT'S BUTT CREAM: 1.0000 "application " | TOPICAL_CREAM | Freq: Three times a day (TID) | CUTANEOUS | 2 refills | Status: DC

## 2018-12-18 MED ORDER — PREDNISONE 20 MG PO TABS: 60.0000 mg | ORAL_TABLET | Freq: Every day | ORAL | 0 refills | Status: DC

## 2018-12-18 NOTE — Telephone Encounter (Signed)
Called patient and LMOVM to return call  [place] for [facility] to Discuss results / PCP / recommendations / Schedule patient  Left message for them on cell number since I could not get anyone on the home number for them to call back to schedule a Doxy virtual appointment on Tuesday morning, 12/22/18.  CRM Created.

## 2018-12-18 NOTE — Consult Note (Signed)
Referring Provider: No ref. provider found Primary Care Physician:  Eulas Post, MD Primary Nephrologist:  Dr. Royce Macadamia  Reason for Consultation:   Management of chronic kidney disease, maintenance of euvolemia, evaluation and treatment of anemia.  HPI: This is a very pleasant 85 year old lady who has a history of progressive renal insufficiency in the setting of a positive ANCA diagnosed with microscopic polyangiitis.  She is followed by one of my partners Dr. Royce Macadamia at [facility].  She has a history of hypertension hypothyroidism.  She presented to the emergency room with a hemoglobin of 5.5 but decreased since March when hemoglobin was 9.2.  Fecal occult testing was negative.  She was afebrile.  She had received rituximab during the month of April and had been receiving once weekly dosing for a total of 4 doses for her ANCA vasculitis.  She is managed by her rheumatologist Dr. Amil Amen.   Sodium 135 potassium 4.2 chloride 107 CO2 19 BUN is 57 creatinine 2.28 glucose 123 calcium 7.7 phosphorus 4.8 albumin 2.3 hemoglobin 8.8 WBC 7.7 platelets 118  Pressure 154/68 pulse of 67 temperature 97.5 O2 sats 85% room air  Status post 2 units packed red blood cells 12/15/2018  Urine output 2 L 521 2022 L 12/16/2018 500 cc 12/18/2018.  She is currently receiving IV saline  Medications amlodipine 10 mg daily, Aranesp 100 mcg subcu administered 12/18/2018, hydralazine 50 mg 3 times daily, levothyroxine 50 mcg daily, metoprolol 50 mg twice daily, Protonix 20 mg daily, prednisone 60 mg daily      Past Medical History:  Diagnosis Date  . Acquired absence of organ, genital organs   . Allergic rhinitis, cause unspecified   . Anxiety state, unspecified   . Disorder of bone and cartilage, unspecified   . Hypothyroid   . Irritable bowel syndrome   . Lumbago   . Other and unspecified hyperlipidemia   . Perforation of tympanic membrane, unspecified   . Unspecified essential hypertension      Past Surgical History:  Procedure Laterality Date  . ABDOMINAL HYSTERECTOMY    . cataract surgery  5/12 and 12/24/2012   both eyes  . TONSILLECTOMY      Prior to Admission medications   Medication Sig Start Date End Date Taking? Authorizing Provider  amLODipine (NORVASC) 10 MG tablet TAKE ONE TABLET BY MOUTH DAILY 10/12/18  Yes Burchette, Alinda Sierras, MD  atorvastatin (LIPITOR) 20 MG tablet TAKE ONE TABLET BY MOUTH DAILY 10/12/18  Yes Burchette, Alinda Sierras, MD  Cholecalciferol (VITAMIN D) 50 MCG (2000 UT) CAPS Take 2,000 Units by mouth daily.   Yes [provider]  dapsone 100 MG tablet Take 100 mg by mouth every morning. 11/23/18  Yes [provider]  diphenhydramine-acetaminophen (TYLENOL PM) 25-500 MG TABS Take 1 tablet by mouth at bedtime as needed.     Yes [provider]  gemfibrozil (LOPID) 600 MG tablet TAKE ONE TABLET BY MOUTH DAILY 10/12/18  Yes Burchette, Alinda Sierras, MD  hydrALAZINE (APRESOLINE) 50 MG tablet Take 1 tablet (50 mg total) by mouth 2 (two) times daily. Patient taking differently: Take 50 mg by mouth 3 (three) times daily.  10/07/18  Yes Burchette, Alinda Sierras, MD  levothyroxine (SYNTHROID, LEVOTHROID) 50 MCG tablet TAKE ONE TABLET BY MOUTH EVERY MORNING BEFORE BREAKFAST 10/12/18  Yes Burchette, Alinda Sierras, MD  metoprolol succinate (TOPROL-XL) 50 MG 24 hr tablet TAKE ONE TABLET BY MOUTH TWICE A DAY 09/17/18  Yes Burchette, Alinda Sierras, MD  predniSONE (DELTASONE) 20 MG  tablet Take 50 mg by mouth daily with breakfast. Taking 2.5 tablets daily = 50mg  10/28/18  Yes [provider]  sodium bicarbonate 650 MG tablet Take 650 mg by mouth 2 (two) times daily.   Yes [provider]  spironolactone (ALDACTONE) 25 MG tablet TAKE ONE TABLET BY MOUTH DAILY 10/12/18  Yes Burchette, Alinda Sierras, MD  furosemide (LASIX) 40 MG tablet Take 0.5 tablets (20 mg total) by mouth 3 (three) times a week. Monday, Wed, Friday 12/18/18   Nita Sells, MD  hydrALAZINE  (APRESOLINE) 50 MG tablet Take 1 tablet (50 mg total) by mouth 3 (three) times daily. 12/18/18   Nita Sells, MD  Hydrocortisone (GERHARDT'S BUTT CREAM) CREA Apply 1 application topically 3 (three) times daily. 12/18/18   Nita Sells, MD  pantoprazole (PROTONIX) 20 MG tablet Take 1 tablet (20 mg total) by mouth daily. 12/19/18   Nita Sells, MD  predniSONE (DELTASONE) 20 MG tablet Take 3 tablets (60 mg total) by mouth daily before breakfast. 12/19/18   Nita Sells, MD  valACYclovir (VALTREX) 1000 MG tablet Take 1,000 mg by mouth 2 (two) times daily. Duration 5 days Started on 12-01-18 12/01/18   [provider]    Current Facility-Administered Medications  Medication Dose Route Frequency Provider Last Rate Last Dose  . 0.9 %  sodium chloride infusion (Manually program via Guardrails IV Fluids)   Intravenous Once Opyd, Ilene Qua, MD      . acetaminophen (TYLENOL) tablet 650 mg  650 mg Oral Q6H PRN Opyd, Ilene Qua, MD   650 mg at 12/16/18 3893   Or  . acetaminophen (TYLENOL) suppository 650 mg  650 mg Rectal Q6H PRN Opyd, Ilene Qua, MD      . amLODipine (NORVASC) tablet 10 mg  10 mg Oral Daily Opyd, Ilene Qua, MD   10 mg at 12/18/18 0835  . Gerhardt's butt cream   Topical TID Nita Sells, MD      . hydrALAZINE (APRESOLINE) tablet 50 mg  50 mg Oral TID Vianne Bulls, MD   50 mg at 12/18/18 0835  . insulin aspart (novoLOG) injection 0-5 Units  0-5 Units Subcutaneous QHS Opyd, Timothy S, MD      . insulin aspart (novoLOG) injection 0-9 Units  0-9 Units Subcutaneous TID WC Opyd, Ilene Qua, MD   3 Units at 12/16/18 1650  . levothyroxine (SYNTHROID) tablet 50 mcg  50 mcg Oral QAC breakfast Opyd, Ilene Qua, MD   50 mcg at 12/18/18 0559  . metoprolol succinate (TOPROL-XL) 24 hr tablet 50 mg  50 mg Oral BID Opyd, Ilene Qua, MD   50 mg at 12/17/18 2301  . ondansetron (ZOFRAN) tablet 4 mg  4 mg Oral Q6H PRN Opyd, Ilene Qua, MD       Or  . ondansetron  (ZOFRAN) injection 4 mg  4 mg Intravenous Q6H PRN Opyd, Ilene Qua, MD      . pantoprazole (PROTONIX) EC tablet 20 mg  20 mg Oral Daily Nita Sells, MD   20 mg at 12/18/18 0835  . predniSONE (DELTASONE) tablet 60 mg  60 mg Oral QAC breakfast Nita Sells, MD   60 mg at 12/18/18 7342  . sodium chloride flush (NS) 0.9 % injection 3 mL  3 mL Intravenous Q12H Opyd, Ilene Qua, MD   3 mL at 12/18/18 0836    Allergies as of 12/15/2018 - Review Complete 12/15/2018  Allergen Reaction Noted  . Alendronate sodium    . Amoxicillin    .  Codeine    . Valsartan      Family History  Problem Relation Age of Onset  . Lung cancer Mother 36  . Lung disease Father 31  . COPD Other        sibling  . Colon cancer Neg Hx     Social History   Socioeconomic History  . Marital status: Married    Spouse name: Jeneen Rinks  . Number of children: 2  . Years of education: Not on file  . Highest education level: Not on file  Occupational History  . Occupation: retired Health visitor  . Financial resource strain: Not on file  . Food insecurity:    Worry: Not on file    Inability: Not on file  . Transportation needs:    Medical: Not on file    Non-medical: Not on file  Tobacco Use  . Smoking status: Never Smoker  . Smokeless tobacco: Never Used  Substance and Sexual Activity  . Alcohol use: No  . Drug use: No  . Sexual activity: Not on file  Lifestyle  . Physical activity:    Days per week: Not on file    Minutes per session: Not on file  . Stress: Not on file  Relationships  . Social connections:    Talks on phone: Not on file    Gets together: Not on file    Attends religious service: Not on file    Active member of club or organization: Not on file    Attends meetings of clubs or organizations: Not on file    Relationship status: Not on file  . Intimate partner violence:    Fear of current or ex partner: Not on file    Emotionally abused: Not on file     Physically abused: Not on file    Forced sexual activity: Not on file  Other Topics Concern  . Not on file  Social History Narrative   Son is Doug Coble--pt here     Review of Systems: Gen: Fatigue and weakness no fever sweats or chills  HEENT: No visual complaints, No history of Retinopathy. Normal external appearance No Epistaxis or Sore throat. No sinusitis.   CV: Denies chest pain, angina, palpitations, syncope, orthopnea, PND, peripheral edema, and claudication. Resp: Denies dyspnea at rest, dyspnea with exercise, cough, sputum, wheezing, coughing up blood, and pleurisy. GI: Denies vomiting blood, jaundice, and fecal incontinence.   Denies dysphagia or odynophagia.  Guaiac negative GU : Denies urinary burning, blood in urine, urinary frequency, urinary hesitancy, nocturnal urination, and urinary incontinence.  No renal calculi. MS: Complains of some weakness Derm: Sacral decubitus seen by wound care Psych: Denies depression, anxiety, memory loss, suicidal ideation, hallucinations, paranoia, and confusion. Heme: Denies bruising, bleeding, and enlarged lymph nodes. Neuro: No headache.  No diplopia. No dysarthria.  No dysphasia.  No history of CVA.  No Seizures. No paresthesias.  No weakness. Endocrine hypothyroidism.  No Adrenal disease.  Physical Exam: Vital signs in last 24 hours: Temp:  [97.4 F (36.3 C)-98.5 F (36.9 C)] 97.5 F (36.4 C) (05/22 0841) Pulse Rate:  [65-73] 67 (05/22 0841) Resp:  [18-19] 18 (05/22 0841) BP: (147-155)/(63-69) 154/68 (05/22 0841) SpO2:  [92 %-95 %] 95 % (05/22 0841) Weight:  [59.6 kg] 59.6 kg (05/21 2256) Last BM Date: 12/17/18 General:  nondistressed elderly lady  head:  Normocephalic and atraumatic. Eyes:  Sclera clear, no icterus.   Conjunctiva pink.  Subconjunctival edema facial puffiness ears:  Normal auditory  acuity. Nose:  No deformity, discharge,  or lesions. Mouth:  No deformity or lesions, dentition normal. Neck:  Supple; no masses  or thyromegaly. JVP not elevated Lungs:  Clear throughout to auscultation.   No wheezes, crackles, or rhonchi. No acute distress. Heart:  Regular rate and rhythm; no murmurs, clicks, rubs,  or gallops. Abdomen:  Soft, nontender and nondistended. No masses, hepatosplenomegaly or hernias noted. Normal bowel sounds, without guarding, and without rebound.   Msk:  Symmetrical without gross deformities. Normal posture. Pulses:  No carotid, renal, femoral bruits. DP and PT symmetrical and equal Extremities:  Without clubbing 2+ pitting edema Neurologic:  Alert and  oriented x4;  grossly normal neurologically. Skin:  Intact without significant lesions or rashes. Cervical Nodes:  No significant cervical adenopathy. Psych:  Alert and cooperative. Normal mood and affect.  Intake/Output from previous day: 05/21 0701 - 05/22 0700 In: 3133.6 [P.O.:540; I.V.:2593.6] Out: 1875 [Urine:1875] Intake/Output this shift: Total I/O In: 1311.7 [P.O.:480; I.V.:581.7; Other:250] Out: -   Lab Results: Recent Labs    12/16/18 0744 12/17/18 0419 12/18/18 0630  WBC 8.3 6.9 7.2  HGB 10.0* 9.0* 8.8*  HCT 29.0* 26.4* 26.6*  PLT 129* 127* 118*   BMET Recent Labs    12/16/18 0744 12/17/18 0419 12/18/18 0630  NA 137 136 135  K 4.2 4.4 4.2  CL 102 105 107  CO2 24 22 19*  GLUCOSE 90 93 123*  BUN 62* 62* 57*  CREATININE 2.41* 2.63* 2.28*  CALCIUM 8.7* 8.0* 7.7*  PHOS  --  4.0 4.8*   LFT Recent Labs    12/15/18 1452  12/18/18 0630  PROT 6.0*  --   --   ALBUMIN 3.2*   < > 2.3*  AST 32  --   --   ALT 31  --   --   ALKPHOS 41  --   --   BILITOT 0.9  --   --    < > = values in this interval not displayed.   PT/INR No results for input(s): LABPROT, INR in the last 72 hours. Hepatitis Panel No results for input(s): HEPBSAG, HCVAB, HEPAIGM, HEPBIGM in the last 72 hours.  Studies/Results: No results found.  Assessment/Plan:  Progressive renal insufficiency in the setting of an MPO vasculitis  she been treated with rituximab in the month of April by Dr. Amil Amen.  It appears that her creatinine is increased to 2.5 on admission.  This is stabilized since her admission and has not worsened during her hospitalization.  We will continue to avoid nonsteroidal anti-inflammatory drugs ACE inhibitor's ARB's there is no administration of IV contrast or intra-arterial procedures.  We should follow renal function  Anemia profound anemia on admission hemoglobin 5.5 no evidence of GI bleeding guaiac test transfused 2 units packed red blood cells improved to 10.0 will administer darbepoetin and can follow-up at [facility] with Dr. Royce Macadamia  Volume/hypertension does have some lower extremity edema blood pressure appears to be well controlled we will discontinue IV fluids and will give IV Lasix x2 doses 80 mg daily.  Will be to discharge her home on oral Lasix.  Bones appear to be stable calcium phosphorus appear to be within goal.  GI prophylaxis agree with continuing Protonix 40 mg daily.   LOS: [place] @TODAY @12 :24 PM

## 2018-12-18 NOTE — Telephone Encounter (Signed)
Please see message. °

## 2018-12-18 NOTE — Discharge Summary (Addendum)
Physician Discharge Summary  Emily Cannon HDQ:[id number] DOB: 21-Jan-1933 DOA: 12/15/2018  PCP: Eulas Post, MD  Admit date: 12/15/2018 Discharge date: 12/18/2018  Time spent: 37 minutes  Recommendations for Outpatient Follow-up:  1. Medication changes-discontinue dapsone, change Lasix 40 daily, Give Kdur replacement until seen by Nephro 2. May need outpatient urology input?  Has some element of urge incontinence and hesitate to use agents in an 85 year old that have potential for confusion 3. Recommend outpatient follow-up with Dr. Royetta Asal these have already been set up 4. Needs renal panel 1 week 5. Resumed prednisone 60 mg on discharge 6. Suggest OP monitoring of CBG as OP with meter if feasible per PCP--she has steroid induced hyperglycemia and might need an A1C when next seen 7. Received darbepoetin 100 mcg per renal this admission 8. Patient will require TSH in about 3 weeks  Discharge Diagnoses:  Principal Problem:   Symptomatic anemia Active Problems:   Essential hypertension   Hypothyroidism   Dermatitis   Microscopic polyangiitis (HCC)   AKI (acute kidney injury) ([facility])   Hypomagnesemia   Steroid-induced hyperglycemia   Discharge Condition: Good  Diet recommendation: Renal  Filed Weights   12/16/18 0110 12/16/18 2122 12/17/18 2256  Weight: 57.6 kg 57.5 kg 59.6 kg    History of present illness:  85 year old Cannon History of polyangiitis on Rituxan/prednisone, HTN, hypothyroid, steroid-induced diabetes mellitus, underlying CKD Admit 12/15/2018 symptomatic anemia in the setting of progressive fatigue-hemoglobin 5.5 down from 9.2 prior -Also noted to AKI from creatinine 1.6-->2.4 Work-up including renal ultrasound in addition to work-up for anemia was not suggestive of occult GI bleed  Hospital Course:  Symptomatic anemia--likely secondary to iatrogenic therapy Iron studies not suggestive Iron def Hb up after 2 U PRBC was appropriate > 2/2  Rituxan?--Hemolysis labs were obtained however in the setting of Rituxan and most recently dapsone which can also suppress the bone marrow I am not sure how to interpret them I will defer management of her anemia to her outpatient specialist Dr. Royce Macadamia and Amil Amen Patient received darbepoetin prior to discharge AKI Hypomagnesemia/Hypokalemia Urine analysis showed blood and >100 proteinuria-this was felt to be in keeping with the patient's microscopic polyangiitis FENa is 8.0 which suggests postobstructive uropathy- Diuresing as per Renal--replacing Kdur Microscopic polyangiitis Resuming steroids at 60 mg prednisone HTN Cont amlodipine 10 daily, metoprolol 50 bid Monitor trends overnight Hypothyroid Cont synthroid 50 mcg q am Needs TSH 3 weeks Adult failure to thrive, BMI 24 Hyperlipidemia             Hold statin--some evidence can be implicated in  Procedures: None  Consultations:  Telephone consulted Dr. Royce Macadamia and discussed with Dr. Justin Mend this admission  Discharge Exam: Vitals:   12/18/18 0644 12/18/18 0841  BP: (!) 155/69 (!) 154/68  Pulse: 66 67  Resp: 19 18  Temp: (!) 97.4 F (36.3 C) (!) 97.5 F (36.4 C)  SpO2: 95% 95%     Discharge Instructions   Discharge Instructions    Change dressing   Complete by:  As directed    (Gerhart's Butt Cream). This hydrocortisone/lotrimin and zinc oxide treatment can be applied three times daily. Patient can wear mesh briefs and a feminine hygiene pad for comfort. I will provide a pressure redistribution chair pad for her use when OOB in chair for comfort.    Diet - low sodium heart healthy   Complete by:  As directed    Discharge instructions   Complete by:  As directed    Continue  prednisone 60 daily until seen by Dr. Amil Amen No dapsone Lasix 40 daily, Kdur as per MAr until seen by primary nephrologist Dr. Royce Macadamia Recommend use of cream 3 times a day with dressings and mobilization Recommend outpatient coordination with  multiple specialists regarding MAR-patient and family will need guidance regarding essential meds   Discharge patient   Complete by:  As directed    Discharge disposition:  01-Home or Self Care   Discharge patient date:  12/19/2018   Increase activity slowly   Complete by:  As directed      Allergies as of 12/18/2018      Reactions   Alendronate Sodium    REACTION: pt states "short of breath"   Amoxicillin    REACTION: causes severe diarrhea   Codeine    REACTION: pt states nausea   Valsartan    REACTION: gums/throat swelling      Medication List    STOP taking these medications   dapsone 100 MG tablet   diphenhydramine-acetaminophen 25-500 MG Tabs tablet Commonly known as:  TYLENOL PM   spironolactone 25 MG tablet Commonly known as:  ALDACTONE   valACYclovir 1000 MG tablet Commonly known as:  VALTREX     TAKE these medications   amLODipine 10 MG tablet Commonly known as:  NORVASC TAKE ONE TABLET BY MOUTH DAILY   atorvastatin 20 MG tablet Commonly known as:  LIPITOR TAKE ONE TABLET BY MOUTH DAILY   furosemide 40 MG tablet Commonly known as:  LASIX Take 0.5 tablets (20 mg total) by mouth 3 (three) times a week. Monday, Wed, Friday What changed:  how much to take   gemfibrozil 600 MG tablet Commonly known as:  LOPID TAKE ONE TABLET BY MOUTH DAILY   Gerhardt's butt cream Crea Apply 1 application topically 3 (three) times daily.   hydrALAZINE 50 MG tablet Commonly known as:  APRESOLINE Take 1 tablet (50 mg total) by mouth 3 (three) times daily.   levothyroxine 50 MCG tablet Commonly known as:  SYNTHROID TAKE ONE TABLET BY MOUTH EVERY MORNING BEFORE BREAKFAST   metoprolol succinate 50 MG 24 hr tablet Commonly known as:  TOPROL-XL TAKE ONE TABLET BY MOUTH TWICE A DAY   pantoprazole 20 MG tablet Commonly known as:  PROTONIX Take 1 tablet (20 mg total) by mouth daily. Start taking on:  Dec 19, 2018   predniSONE 20 MG tablet Commonly known as:   DELTASONE Take 3 tablets (60 mg total) by mouth daily before breakfast. Start taking on:  Dec 19, 2018 What changed:    how much to take  when to take this  additional instructions   sodium bicarbonate 650 MG tablet Take 650 mg by mouth 2 (two) times daily.   Vitamin D 50 MCG (2000 UT) Caps Take 2,000 Units by mouth daily.      Allergies  Allergen Reactions  . Alendronate Sodium     REACTION: pt states "short of breath"  . Amoxicillin     REACTION: causes severe diarrhea  . Codeine     REACTION: pt states nausea  . Valsartan     REACTION: gums/throat swelling      The results of significant diagnostics from this hospitalization (including imaging, microbiology, ancillary and laboratory) are listed below for reference.    Significant Diagnostic Studies: US Renal  Result Date: 12/15/2018 CLINICAL DATA:  86 year old Cannon with acute renal failure EXAM: RENAL / URINARY TRACT ULTRASOUND COMPLETE COMPARISON:  None. FINDINGS: Right Kidney: Length: 10.6 cm x 4.6 cm  x 4.8 cm, 122 cc. Echogenicity within normal limits. No mass or hydronephrosis visualized. Left Kidney: Length: 11.7 cm x 4.9 cm x 5.5 cm, 164 cc. Echogenicity within normal limits. No mass or hydronephrosis visualized. Bladder: Appears normal for degree of bladder distention. IMPRESSION: Negative for hydronephrosis. Unremarkable echogenicity of the kidney parenchyma. Electronically Signed   By: Corrie Mckusick D.O.   On: 12/15/2018 09:03   Dg Chest Portable 1 View  Result Date: 12/15/2018 CLINICAL DATA:  Microscopic polyangiitis. Bilateral lower extremity swelling. Hypertension. EXAM: PORTABLE CHEST 1 VIEW COMPARISON:  CT chest dated 10/22/2018 FINDINGS: The heart is enlarged. There are few linear opacities at the lung bases. No pneumothorax. No large pleural effusion. No acute osseous abnormality. There is an old healed left clavicle fracture. IMPRESSION: 1. Mild stable cardiomegaly. 2. No acute cardiopulmonary process.  Previously noted ground-glass airspace opacities are better visualized on prior CT Electronically Signed   By: Constance Holster M.D.   On: 12/15/2018 16:45    Microbiology: Recent Results (from the past 240 hour(s))  SARS Coronavirus 2 (CEPHEID - Performed in [place] hospital lab), Hosp Order     Status: None   Collection Time: 12/15/18  5:23 PM  Result Value Ref Range Status   SARS Coronavirus 2 NEGATIVE NEGATIVE Final    Comment: (NOTE) If result is NEGATIVE SARS-CoV-2 target nucleic acids are NOT DETECTED. The SARS-CoV-2 RNA is generally detectable in upper and lower  respiratory specimens during the acute phase of infection. The lowest  concentration of SARS-CoV-2 viral copies this assay can detect is 250  copies / mL. A negative result does not preclude SARS-CoV-2 infection  and should not be used as the sole basis for treatment or other  patient management decisions.  A negative result may occur with  improper specimen collection / handling, submission of specimen other  than nasopharyngeal swab, presence of viral mutation(s) within the  areas targeted by this assay, and inadequate number of viral copies  (<250 copies / mL). A negative result must be combined with clinical  observations, patient history, and epidemiological information. If result is POSITIVE SARS-CoV-2 target nucleic acids are DETECTED. The SARS-CoV-2 RNA is generally detectable in upper and lower  respiratory specimens dur ing the acute phase of infection.  Positive  results are indicative of active infection with SARS-CoV-2.  Clinical  correlation with patient history and other diagnostic information is  necessary to determine patient infection status.  Positive results do  not rule out bacterial infection or co-infection with other viruses. If result is PRESUMPTIVE POSTIVE SARS-CoV-2 nucleic acids MAY BE PRESENT.   A presumptive positive result was obtained on the submitted specimen  and confirmed on  repeat testing.  While 2019 novel coronavirus  (SARS-CoV-2) nucleic acids may be present in the submitted sample  additional confirmatory testing may be necessary for epidemiological  and / or clinical management purposes  to differentiate between  SARS-CoV-2 and other Sarbecovirus currently known to infect humans.  If clinically indicated additional testing with an alternate test  methodology [phone]) is advised. The SARS-CoV-2 RNA is generally  detectable in upper and lower respiratory sp ecimens during the acute  phase of infection. The expected result is Negative. Fact Sheet for Patients:  StrictlyIdeas.no Fact Sheet for Healthcare Providers: BankingDealers.co.za This test is not yet approved or cleared by the [place] FDA and has been authorized for detection and/or diagnosis of SARS-CoV-2 by FDA under an Emergency Use Authorization (EUA).  This EUA will remain in  effect (meaning this test can be used) for the duration of the COVID-19 declaration under Section 564(b)(1) of the Act, 21 U.S.C. section 360bbb-3(b)(1), unless the authorization is terminated or revoked sooner. Performed at [facility], [place] [address] [place], [place] [address]      Labs: Basic Metabolic Panel: Recent Labs  Lab 12/15/18 1452 12/15/18 1703 12/16/18 0744 12/17/18 0419 12/18/18 0630  NA 134*  --  137 136 135  K 4.9  --  4.2 4.4 4.2  CL 101  --  102 105 107  CO2 19*  --  24 22 19*  GLUCOSE 219*  --  90 93 123*  BUN 62*  --  62* 62* 57*  CREATININE 2.49*  --  2.41* 2.63* 2.28*  CALCIUM 8.6*  --  8.7* 8.0* 7.7*  MG  --  1.6*  --   --   --   PHOS  --   --   --  4.0 4.8*   Liver Function Tests: Recent Labs  Lab 12/15/18 1452 12/17/18 0419 12/18/18 0630  AST 32  --   --   ALT 31  --   --   ALKPHOS 41  --   --   BILITOT 0.9  --   --   PROT 6.0*  --   --   ALBUMIN 3.2* 2.5* 2.3*   No results for input(s): LIPASE, AMYLASE in the  last 168 hours. No results for input(s): AMMONIA in the last 168 hours. CBC: Recent Labs  Lab 12/15/18 1452 12/16/18 0744 12/17/18 0419 12/18/18 0630  WBC 10.3 8.3 6.9 7.2  NEUTROABS  --   --  5.7 6.3  HGB 5.5* 10.0* 9.0* 8.8*  HCT Emily.5* 29.0* 26.4* 26.6*  MCV 98.3 89.5 90.7 91.1  PLT 186 129* 127* 118*   Cardiac Enzymes: No results for input(s): CKTOTAL, CKMB, CKMBINDEX, TROPONINI in the last 168 hours. BNP: BNP (last 3 results) No results for input(s): BNP in the last 8760 hours.  ProBNP (last 3 results) No results for input(s): PROBNP in the last 8760 hours.  CBG: Recent Labs  Lab 12/17/18 0704 12/17/18 1121 12/17/18 1645 12/17/18 2257 12/18/18 0650  GLUCAP 74 118* 84 183* 106*       Signed:  Nita Sells MD   [facility] 12/18/2018, 11:03 AM

## 2018-12-18 NOTE — Consult Note (Signed)
[facility] Nurse wound consult note Reason for Consult: Partial thickness wounds, ruptured serum filled blisters on buttocks and near rectum/perineal area. Wound type: Etiology not known, there are not pressure lesions Pressure Injury POA: N/A Measurement: Area on buttocks measures 4cm x 5cm and largest blister measures 2.2cm x 1.8cm. wound bed is moist, pink, painful. Several smaller blisters in the affected area. Wound bed:See above Drainage (amount, consistency, odor) small amount of serous exudate, dry gauze dressings have become adherent and cause a small amount of bleeding upon removal Periwound: intact, macerated Dressing procedure/placement/frequency: I will discontinue the silicone foam dressings with gauze beneath them in favor of a topical compounded treatment (Gerhart's Butt Cream). This hydrocortisone/lotrimin and zinc oxide treatment can be applied three times daily. Patient can wear mesh briefs and a feminine hygiene pad for comfort. I will provide a pressure redistribution chair pad for her use when OOB in chair for comfort.  [facility] nursing team will not follow, but will remain available to this patient, the nursing and medical teams.  Please re-consult if needed. Thanks, Maudie Flakes, MSN, RN, [place], Arther Abbott  Pager# [phone]

## 2018-12-18 NOTE — Progress Notes (Signed)
Discussed the case in person with Dr. Justin Mend of nephrology-we will cancel discharge today-we will monitor U OP in addition to creatinine and probably discharge home 5/23-family is aware as per their discussion with nephrology

## 2018-12-18 NOTE — Telephone Encounter (Signed)
Copied from [place] [phone]. Topic: General - Inquiry >> Dec 18, 2018  1:53 PM Richardo Priest, NT wrote: Reason for CRM: Patient's husband is calling in stating he would like a home health aide to be ordered for his wife and himself to help around the house. Patient's husband is expressing concerns due to wife's recent hospital stay. Best call back is [phone].

## 2018-12-18 NOTE — Telephone Encounter (Signed)
Set up doxy for Tuesday morning.  I am surprised that they did not set up from her hospital d/c.

## 2018-12-18 NOTE — Care Management Important Message (Signed)
Important Message  Patient Details  Name: Emily Cannon MRN: [id number] Date of Birth: 12/09/1932   Medicare Important Message Given:  Yes    Orbie Pyo 12/18/2018, 3:08 PM

## 2018-12-19 LAB — RENAL FUNCTION PANEL
Albumin: 2.8 g/dL — ABNORMAL LOW (ref 3.5–5.0)
Anion gap: 12 (ref 5–15)
BUN: 59 mg/dL — ABNORMAL HIGH (ref 8–23)
CO2: 19 mmol/L — ABNORMAL LOW (ref 22–32)
Calcium: 8.4 mg/dL — ABNORMAL LOW (ref 8.9–10.3)
Chloride: 104 mmol/L (ref 98–111)
Creatinine, Ser: 2.27 mg/dL — ABNORMAL HIGH (ref 0.44–1.00)
GFR calc Af Amer: 22 mL/min — ABNORMAL LOW (ref >60)
GFR calc non Af Amer: 19 mL/min — ABNORMAL LOW (ref >60)
Glucose, Bld: 90 mg/dL (ref 70–99)
Phosphorus: 4.2 mg/dL (ref 2.5–4.6)
Potassium: 2.9 mmol/L — ABNORMAL LOW (ref 3.5–5.1)
Sodium: 135 mmol/L (ref 135–145)

## 2018-12-19 LAB — CBC WITH DIFFERENTIAL/PLATELET
Abs Immature Granulocytes: 0.27 10*3/uL — ABNORMAL HIGH (ref 0.00–0.07)
Basophils Absolute: 0 10*3/uL (ref 0.0–0.1)
Basophils Relative: 0 %
Eosinophils Absolute: 0 10*3/uL (ref 0.0–0.5)
Eosinophils Relative: 0 %
HCT: 27.7 % — ABNORMAL LOW (ref 36.0–46.0)
Hemoglobin: 9.2 g/dL — ABNORMAL LOW (ref 12.0–15.0)
Immature Granulocytes: 4 %
Lymphocytes Relative: 8 %
Lymphs Abs: 0.6 10*3/uL — ABNORMAL LOW (ref 0.7–4.0)
MCH: 30.8 pg (ref 26.0–34.0)
MCHC: 33.2 g/dL (ref 30.0–36.0)
MCV: 92.6 fL (ref 80.0–100.0)
Monocytes Absolute: 0.3 10*3/uL (ref 0.1–1.0)
Monocytes Relative: 5 %
Neutro Abs: 5.9 10*3/uL (ref 1.7–7.7)
Neutrophils Relative %: 83 %
Platelets: 124 10*3/uL — ABNORMAL LOW (ref 150–400)
RBC: 2.99 MIL/uL — ABNORMAL LOW (ref 3.87–5.11)
RDW: 20.6 % — ABNORMAL HIGH (ref 11.5–15.5)
WBC: 7.2 10*3/uL (ref 4.0–10.5)
nRBC: 0.8 % — ABNORMAL HIGH (ref 0.0–0.2)

## 2018-12-19 LAB — GLUCOSE, CAPILLARY: Glucose-Capillary: 82 mg/dL (ref 70–99)

## 2018-12-19 MED ORDER — POTASSIUM CHLORIDE CRYS ER 20 MEQ PO TBCR
40.0000 meq | EXTENDED_RELEASE_TABLET | Freq: Every day | ORAL | Status: DC
  Administered 2018-12-19: 40 meq via ORAL
  Filled 2018-12-19: qty 2

## 2018-12-19 MED ORDER — POTASSIUM CHLORIDE CRYS ER 20 MEQ PO TBCR: 40.0000 meq | EXTENDED_RELEASE_TABLET | Freq: Two times a day (BID) | ORAL | 0 refills | Status: DC

## 2018-12-19 MED ORDER — FUROSEMIDE 40 MG PO TABS: 40.0000 mg | ORAL_TABLET | Freq: Every day | ORAL | 0 refills | Status: DC

## 2018-12-19 NOTE — Progress Notes (Signed)
Spoke with patients son Marden Noble and reviewed D/C instructions with him.

## 2018-12-19 NOTE — Progress Notes (Signed)
Seen examined  Awake alert and pleasant Ready for home  pls note K is low and note dosage changes of lasix as per my addedned d/c summary  Verneita Griffes, MD [facility] 10:16 AM

## 2018-12-19 NOTE — Progress Notes (Signed)
[place] to be discharged Home per MD order. Discussed prescriptions and follow up appointments with the patient. Prescriptions given to patient; medication list explained in detail. Patient verbalized understanding.  Skin clean, dry and intact without evidence of skin break down, no evidence of skin tears noted. IV catheter discontinued intact. Site without signs and symptoms of complications. Dressing and pressure applied. Pt denies pain at the site currently. No complaints noted.  Patient free of lines, drains, and wounds.   An After Visit Summary (AVS) was printed and given to the patient. Patient escorted via wheelchair, and discharged home via private auto.  Aneta Mins BSN, RN3

## 2018-12-19 NOTE — Progress Notes (Signed)
[facility] ROUNDING NOTE   Subjective:   This is a very pleasant 85 year old lady who had progressive renal insufficiency in setting of a positive ANCA diagnosed with microscopic and polyangiitis.  Followed by Dr. Royce Macadamia at [facility].  She presented to the emergency room 12/15/2018 with a hemoglobin of 5.5 that decreased from 9.2 in March 2020.  It was reassuring that a fecal occult testing was negative.  In the interim she had received rituximab in the month of April once weekly dosing for 4 doses for ANCA vasculitis followed by rheumatologist Dr. Amil Amen.  She had some volume overload with some lower extremity edema noted 12/18/2018.  She was placed on IV Lasix overnight.  She appears to have diuresed fairly well with decreasing her lower extremity edema.  Urine output 1200 cc 12/19/2018.  -1.2 L.  Weight 12/18/2018 60.9 that was 1 kg higher than admission weight.  Labs pending this morning.   Blood pressure 137/61 pulse 60 temperature 97.5 O2 sats 95% room air.  Medications amlodipine 10 mg daily, darbepoetin 100 mcg administered 12/18/2018, Lasix 80 mg IV to be administered at 10 AM 523 20202 doses.  Hydralazine 50 mg 3 times daily, insulin sliding scale, levothyroxine 50 mcg daily, Protonix 20 mg daily, prednisone 60 mg daily,     Objective:  Vital signs in last 24 hours:  Temp:  [97.4 F (36.3 C)-97.7 F (36.5 C)] 97.5 F (36.4 C) (05/23 0435) Pulse Rate:  [60-74] 60 (05/23 0435) Resp:  [18] 18 (05/22 1904) BP: (137-154)/(61-69) 137/61 (05/23 0435) SpO2:  [95 %-97 %] 95 % (05/23 0435) Weight:  [60.9 kg] 60.9 kg (05/22 1953)  Weight change: 1.315 kg Filed Weights   12/16/18 2122 12/17/18 2256 12/18/18 1953  Weight: 57.5 kg 59.6 kg 60.9 kg    Intake/Output: I/O last 3 completed shifts: In: 3965.3 [P.O.:540; I.V.:3175.3; Other:250] Out: 2575 [Urine:2575]   Intake/Output this shift:  No intake/output data recorded.  Cushingoid appearance   CVS-regular rate and rhythm faint 3/6 systolic murmur RS-clear to auscultation no wheezes or rales ABD- BS present soft non-distended EXT-2+ edema   Basic Metabolic Panel: Recent Labs  Lab 12/15/18 1452 12/15/18 1703 12/16/18 0744 12/17/18 0419 12/18/18 0630  NA 134*  --  137 136 135  K 4.9  --  4.2 4.4 4.2  CL 101  --  102 105 107  CO2 19*  --  24 22 19*  GLUCOSE 219*  --  90 93 123*  BUN 62*  --  62* 62* 57*  CREATININE 2.49*  --  2.41* 2.63* 2.28*  CALCIUM 8.6*  --  8.7* 8.0* 7.7*  MG  --  1.6*  --   --   --   PHOS  --   --   --  4.0 4.8*    Liver Function Tests: Recent Labs  Lab 12/15/18 1452 12/17/18 0419 12/18/18 0630  AST 32  --   --   ALT 31  --   --   ALKPHOS 41  --   --   BILITOT 0.9  --   --   PROT 6.0*  --   --   ALBUMIN 3.2* 2.5* 2.3*   No results for input(s): LIPASE, AMYLASE in the last 168 hours. No results for input(s): AMMONIA in the last 168 hours.  CBC: Recent Labs  Lab 12/15/18 1452 12/16/18 0744 12/17/18 0419 12/18/18 0630  WBC 10.3 8.3 6.9 7.2  NEUTROABS  --   --  5.7 6.3  HGB 5.5* 10.0* 9.0* 8.8*  HCT 17.5* 29.0* 26.4* 26.6*  MCV 98.3 89.5 90.7 91.1  PLT 186 129* 127* 118*    Cardiac Enzymes: No results for input(s): CKTOTAL, CKMB, CKMBINDEX, TROPONINI in the last 168 hours.  BNP: Invalid input(s): POCBNP  CBG: Recent Labs  Lab 12/18/18 0650 12/18/18 1131 12/18/18 1649 12/18/18 2121 12/19/18 0658  GLUCAP 106* 150* 189* 149* 82    Microbiology: Results for orders placed or performed during the hospital encounter of 12/15/18  SARS Coronavirus 2 (CEPHEID - Performed in [place] hospital lab), Hosp Order     Status: None   Collection Time: 12/15/18  5:23 PM  Result Value Ref Range Status   SARS Coronavirus 2 NEGATIVE NEGATIVE Final    Comment: (NOTE) If result is NEGATIVE SARS-CoV-2 target nucleic acids are NOT DETECTED. The SARS-CoV-2 RNA is generally detectable in upper and lower  respiratory specimens  during the acute phase of infection. The lowest  concentration of SARS-CoV-2 viral copies this assay can detect is 250  copies / mL. A negative result does not preclude SARS-CoV-2 infection  and should not be used as the sole basis for treatment or other  patient management decisions.  A negative result may occur with  improper specimen collection / handling, submission of specimen other  than nasopharyngeal swab, presence of viral mutation(s) within the  areas targeted by this assay, and inadequate number of viral copies  (<250 copies / mL). A negative result must be combined with clinical  observations, patient history, and epidemiological information. If result is POSITIVE SARS-CoV-2 target nucleic acids are DETECTED. The SARS-CoV-2 RNA is generally detectable in upper and lower  respiratory specimens dur ing the acute phase of infection.  Positive  results are indicative of active infection with SARS-CoV-2.  Clinical  correlation with patient history and other diagnostic information is  necessary to determine patient infection status.  Positive results do  not rule out bacterial infection or co-infection with other viruses. If result is PRESUMPTIVE POSTIVE SARS-CoV-2 nucleic acids MAY BE PRESENT.   A presumptive positive result was obtained on the submitted specimen  and confirmed on repeat testing.  While 2019 novel coronavirus  (SARS-CoV-2) nucleic acids may be present in the submitted sample  additional confirmatory testing may be necessary for epidemiological  and / or clinical management purposes  to differentiate between  SARS-CoV-2 and other Sarbecovirus currently known to infect humans.  If clinically indicated additional testing with an alternate test  methodology [phone]) is advised. The SARS-CoV-2 RNA is generally  detectable in upper and lower respiratory sp ecimens during the acute  phase of infection. The expected result is Negative. Fact Sheet for Patients:   StrictlyIdeas.no Fact Sheet for Healthcare Providers: BankingDealers.co.za This test is not yet approved or cleared by the [place] FDA and has been authorized for detection and/or diagnosis of SARS-CoV-2 by FDA under an Emergency Use Authorization (EUA).  This EUA will remain in effect (meaning this test can be used) for the duration of the COVID-19 declaration under Section 564(b)(1) of the Act, 21 U.S.C. section 360bbb-3(b)(1), unless the authorization is terminated or revoked sooner. Performed at [facility], [place] [address] [place],  [address]     Coagulation Studies: No results for input(s): LABPROT, INR in the last 72 hours.  Urinalysis: Recent Labs    12/16/18 1435 12/17/18 1500  COLORURINE YELLOW YELLOW  LABSPEC 1.009 1.009  PHURINE 6.0 6.0  GLUCOSEU NEGATIVE 50*  HGBUR LARGE* LARGE*  BILIRUBINUR NEGATIVE NEGATIVE  KETONESUR NEGATIVE NEGATIVE  PROTEINUR 100* 100*  NITRITE NEGATIVE NEGATIVE  LEUKOCYTESUR LARGE* LARGE*      Imaging: No results found.   Medications:    . sodium chloride   Intravenous Once  . amLODipine  10 mg Oral Daily  . furosemide  80 mg Intravenous Daily  . Gerhardt's butt cream   Topical TID  . hydrALAZINE  50 mg Oral TID  . insulin aspart  0-5 Units Subcutaneous QHS  . insulin aspart  0-9 Units Subcutaneous TID WC  . levothyroxine  50 mcg Oral QAC breakfast  . metoprolol succinate  50 mg Oral BID  . pantoprazole  20 mg Oral Daily  . predniSONE  60 mg Oral QAC breakfast  . sodium chloride flush  3 mL Intravenous Q12H   acetaminophen **OR** acetaminophen, ondansetron **OR** ondansetron (ZOFRAN) IV  Assessment/ Plan:   Progressive renal insufficiency in the setting of MPO vasculitis treated with rituximab in the month of April by Dr. Amil Amen.  Creatinine increased to 2.5 on admission has stabilized and not deteriorated.  She continues to avoid nonsteroidal  inflammatory drugs ACE inhibitors and ARB's.  She has not had any administration of IV contrast or had any interim arterial procedures.  Anemia is fairly profound on admission she received darbepoetin 100 mcg 12/18/2018.  She was transfused 2 units packed red blood cells on 12/16/2018  Volume still has some lower extremity edema would recommend that her outpatient dose of Lasix is resumed.  I would recommend 40 mg daily.  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  Not totaling to more than 3 units daily.  I would recommend that she checks Accu-Cheks and follow a low refined sugar diet.  Disposition I see no reason why she cannot be discharged later on today.  She does have some labs pending.  We will have her follow-up with Dr. Royce Macadamia her nephrologist Wednesday.   LOS: 3 Sherril Croon @TODAY @7 :43 AM

## 2018-12-22 ENCOUNTER — Other Ambulatory Visit: Payer: Self-pay

## 2018-12-22 ENCOUNTER — Telehealth: Payer: Self-pay

## 2018-12-22 ENCOUNTER — Ambulatory Visit (INDEPENDENT_AMBULATORY_CARE_PROVIDER_SITE_OTHER): Payer: Medicare Other | Admitting: Family Medicine

## 2018-12-22 ENCOUNTER — Ambulatory Visit: Payer: Medicare Other | Admitting: Family Medicine

## 2018-12-22 DIAGNOSIS — M317 Microscopic polyangiitis: Secondary | ICD-10-CM

## 2018-12-22 DIAGNOSIS — N179 Acute kidney failure, unspecified: Secondary | ICD-10-CM | POA: Diagnosis not present

## 2018-12-22 DIAGNOSIS — I10 Essential (primary) hypertension: Secondary | ICD-10-CM

## 2018-12-22 DIAGNOSIS — N184 Chronic kidney disease, stage 4 (severe): Secondary | ICD-10-CM | POA: Diagnosis not present

## 2018-12-22 DIAGNOSIS — E039 Hypothyroidism, unspecified: Secondary | ICD-10-CM

## 2018-12-22 DIAGNOSIS — D649 Anemia, unspecified: Secondary | ICD-10-CM

## 2018-12-22 DIAGNOSIS — R801 Persistent proteinuria, unspecified: Secondary | ICD-10-CM | POA: Diagnosis not present

## 2018-12-22 DIAGNOSIS — E7800 Pure hypercholesterolemia, unspecified: Secondary | ICD-10-CM

## 2018-12-22 DIAGNOSIS — I129 Hypertensive chronic kidney disease with stage 1 through stage 4 chronic kidney disease, or unspecified chronic kidney disease: Secondary | ICD-10-CM | POA: Diagnosis not present

## 2018-12-22 DIAGNOSIS — R319 Hematuria, unspecified: Secondary | ICD-10-CM | POA: Diagnosis not present

## 2018-12-22 NOTE — Telephone Encounter (Signed)
Called patient and spoke to Emily Cannon and he stated that she is not doing well at all and very confused and in pain. Emily Cannon is having to bathe her since she has returned from the hospital. Scheduled today for 9:45am.  Copied from [place] [id number]. Topic: Conservator, museum/gallery Patient (Clinic Use ONLY) >> Dec 18, 2018  3:48 PM Emily Cannon, CMA wrote: Reason for CRM: Called patient and LMOVM to return call  [place] for [facility] to Discuss results / PCP / recommendations / Schedule patient  Left message for them on cell number since I could not get anyone on the home number for them to call back to schedule a Doxy virtual appointment on Tuesday morning, 12/22/18.  CRM Created. >> Dec 18, 2018  4:48 PM Emily Cannon wrote: Emily Cannon, pt husband, returned call. They can not do a virtual visit. They have a regular phone and do not use the computer. They are ok with a telephone visit or coming in. Pt has 2 other appt on Tuesday 5/26 in the morning. They are happy to have an appt Tuesday afternoon with Dr. Elease Hashimoto. I was unable to schedule a 30 min OV for her due to restrictions. Please call back. Ok to leave a msg.

## 2018-12-22 NOTE — Progress Notes (Signed)
Patient ID: Emily Cannon, female   DOB: 12-27-1932, 85 y.o.   MRN: [id number]  This visit type was conducted due to national recommendations for restrictions regarding the COVID-19 pandemic in an effort to limit this patient's exposure and mitigate transmission in our community.   Virtual Visit via Telephone Note  I connected with Emily Cannon on 12/22/18 at  9:45 AM EDT by telephone and verified that I am speaking with the correct person using two identifiers.   I discussed the limitations, risks, security and privacy concerns of performing an evaluation and management service by telephone and the availability of in person appointments. I also discussed with the patient that there may be a patient responsible charge related to this service. The patient expressed understanding and agreed to proceed.  Location patient: home Location provider: work or home office Participants present for the call: patient, provider Patient did not have a visit in the prior 7 days to address this/these issue(s).   History of Present Illness: Patient has history of microscopic polyangiitis, hypertension, irritable bowel syndrome, hypothyroidism, osteopenia.  She is followed by nephrology and rheumatology.  She was admitted on 12/15/2026 with symptomatic anemia.  She had some progressive fatigue.  Her hemoglobin did drop down to 5.5 from previous of 9.2.  She also had acute kidney injury with increasing creatinine from 1.6-2.4.  No evidence for occult GI bleed.  Her husband had called last week requesting some additional help at home.  He is feeling somewhat overwhelmed with her care needs.  However, she saw her rheumatologist today and he advised against having anyone going out to the home at this time if possible.  Was felt that her symptomatic anemia likely was iatrogenic.  Iron studies did not show iron deficiency.  ?hemolytic.  Her haptoglobin was low and LDH was elevated.   Hemoglobin improved after 2 units  packed red blood cells.  Patient was apparently taken off Rituxan but started back earlier today by rheumatologist on dapsone.  She apparently had labs done today through rheumatology.  She had potassium 2.9 at discharge.  Her husband states she is taking potassium 20 mEq 2 twice daily.  She is apparently back on prednisone 60 mg daily  She has hypertension and is on multiple medications for that including amlodipine and metoprolol.  She has hypothyroidism on Synthroid 50 mcg daily.  She is due for follow-up TSH at this time.  Husband thinks she is overall "better "compared with where she was couple weeks ago but still has some intermittent confusion and fatigue issues.  She reportedly has follow-up with nephrology later today.  She has had some increased peripheral edema but he states her overall weight is down.  He has multiple questions about nutrition and what he should or should not give her.  Thankfully, she has good appetite- but husband states she has a taste for "sweets".       Observations/Objective: Patient sounds cheerful and well on the phone. I do not appreciate any SOB. Speech and thought processing are grossly intact. Patient reported vitals:  Assessment and Plan:  #1 microscopic polyangiitis followed by rheumatology and currently on prednisone and dapsone  #2 history of anemia with likely iatrogenic worsening with no evidence for GI bleed.  No iron deficiency or B12 deficiency.  She had increased LDH, low haptoglobin, increased reticulocytes which suggested possible hemolytic anemia  #3 hypothyroidism -Patient needs follow-up TSH.  They will see if they can get this drawn either rheumatology or nephrology to  avoid a separate visit  #4 hypokalemia of 2.9 during recent hospitalization -Patient reportedly had labs drawn earlier this morning through rheumatology and hopefully this included basic chemistries  #5 acute kidney injury on chronic kidney disease-followed by  nephrology  Follow Up Instructions:  -She is encouraged to keep close follow-up with nephrology and rheumatology -Husband is requesting some nutritional guidance.  Not sure if nutrition department will be able to do anything through telemedicine follow-up   [id number] 5-10 [id number] 11-20 9443 21-30 I did not refer this patient for an OV in the next 24 hours for this/these issue(s).  I discussed the assessment and treatment plan with the patient. The patient was provided an opportunity to ask questions and all were answered. The patient agreed with the plan and demonstrated an understanding of the instructions.   The patient was advised to call back or seek an in-person evaluation if the symptoms worsen or if the condition fails to improve as anticipated.  I provided 25 minutes of non-face-to-face time during this encounter.   Carolann Littler, MD

## 2018-12-23 ENCOUNTER — Telehealth: Payer: Self-pay

## 2018-12-23 DIAGNOSIS — B029 Zoster without complications: Secondary | ICD-10-CM | POA: Diagnosis not present

## 2018-12-23 DIAGNOSIS — R319 Hematuria, unspecified: Secondary | ICD-10-CM | POA: Diagnosis not present

## 2018-12-23 NOTE — Telephone Encounter (Signed)
Patients husband called and stated that her blood sugar was 300 and Dr. Royce Macadamia from [facility] stated that she needs to go on glypicide and . asking what she needs to go on. [name] also stated that he is taking her to Dr. Nevada Crane her dermatologist this morning at 10:50am, hospital said she does not have shingles and stopped treating her but Dr. Nevada Crane did a biopsy and said she did have shingles.  Please advise.

## 2018-12-23 NOTE — Telephone Encounter (Signed)
Recommend the following:  - see if we can get records from Dr Royce Macadamia. -we really need to get pt a home glucose monitor and recommend check fasting sugars daily and record. - may start Glipizide 2.5 mg once daily (#30 with one refill)- but they need to know that she must still be eating when taking this drug or can cause hypoglycemia. -recommend follow up phone call or Doxy in one week to get some follow up.

## 2018-12-23 NOTE — Telephone Encounter (Signed)
Called Dr. Royce Macadamia at [facility] at [phone] and left a message for staff to please send [place] records for patient and notes for medication request.

## 2018-12-23 NOTE — Telephone Encounter (Signed)
Emily Cannon and he stated that he thinks Dr. Royce Macadamia has sent in Glipizide already for [name]. I have sent in the meter and the test strips for them also.   [name] asked if Dr. Elease Hashimoto could write a letter stating the need for a lift chair for [name] since she has become so weak and her arms are very sore from the blood transfussion and IV lines. [name] has bought the chair already from [employer] and wants letter to be faxed to [phone], [name] is representative and the telephone number is [phone]. [name] states he will not have to pay tax on the chair if we send a letter stating the need for this chair.

## 2018-12-24 ENCOUNTER — Encounter: Payer: Self-pay | Admitting: Family Medicine

## 2018-12-24 ENCOUNTER — Other Ambulatory Visit: Payer: Self-pay

## 2018-12-24 MED ORDER — GLUCOSE BLOOD VI STRP: ORAL_STRIP | 3 refills | Status: DC

## 2018-12-24 MED ORDER — ONETOUCH VERIO W/DEVICE KIT: PACK | 1 refills | Status: DC

## 2018-12-24 NOTE — Telephone Encounter (Signed)
Letter done.   Fax as requested to number on phone note.

## 2018-12-24 NOTE — Telephone Encounter (Signed)
Pt husband stated the pharmacy informed him that they have not received a Rx for the meter and test strips. Pt husband also requests recommendation for home health care provider

## 2018-12-24 NOTE — Telephone Encounter (Signed)
Called patient and let him know that I have sent in the [facility] meter and strips to his pharmacy.

## 2018-12-24 NOTE — Telephone Encounter (Signed)
Letter has been faxed to [phone].

## 2018-12-25 ENCOUNTER — Telehealth: Payer: Self-pay | Admitting: Family Medicine

## 2018-12-25 DIAGNOSIS — R531 Weakness: Secondary | ICD-10-CM

## 2018-12-25 DIAGNOSIS — N184 Chronic kidney disease, stage 4 (severe): Secondary | ICD-10-CM | POA: Diagnosis not present

## 2018-12-25 NOTE — Telephone Encounter (Signed)
Let him know I have placed referral for home health.

## 2018-12-25 NOTE — Telephone Encounter (Signed)
Please see message. °

## 2018-12-25 NOTE — Telephone Encounter (Signed)
Pt husband is calling back and his wife needs OT

## 2018-12-25 NOTE — Telephone Encounter (Signed)
Called patient and LMOVM  [place] for [facility] to Discuss results / PCP / recommendations / Schedule patient  Left a message to let [name] know that Dr. Elease Hashimoto has put in the order for home health.   CRM Created if patient's husband [name] calls back

## 2018-12-25 NOTE — Telephone Encounter (Signed)
Copied from [place] [phone]. Topic: General - Other >> Dec 25, 2018  8:57 AM Alanda Slim E wrote: Reason for CRM: Mr. Daffron called and stated that [facility] needs Dr. Elease Hashimoto to fax over an order to them for the Pt to have Home health services. Mr. Lahaie states he is overwhelmed at home and needs their assistance asap/ [facility] fax# [phone]/ please advise

## 2018-12-28 ENCOUNTER — Telehealth: Payer: Self-pay | Admitting: *Deleted

## 2018-12-28 NOTE — Telephone Encounter (Signed)
Copied from [place] [phone]. Topic: General - Other >> Dec 28, 2018  2:29 PM Wynetta Emery, [place] C wrote: Reason for CRM: pt's son called in to be advised. Pt has an apt on on Wednesday. Son would like to know if pt has to have fasting labs? He's asking because pt has hypoglycemia so he's not sure if pt should fast.   His second question is, pt has been having lab drawn with her nephrologist Dr Royce Macadamia, pt/son was advised that results has been sent to PCP. Son would like to know if they are the same labs? If so, does pt HAVE to repeat?   CB: [phone]

## 2018-12-28 NOTE — Telephone Encounter (Signed)
Called patient and spoke with her son Emily Cannon and he stated that his mom has had labs every week and she is going to have more labs done on Wednesday, 12/30/18 at Dr. Rubye Beach office and her son was wondering if she needed labs if she could have done at Dr. Rubye Beach office? Son also stated that they have stopped the glimepiride because this was causing her blood sugars to drop too low.   Please advise from previous message.

## 2018-12-28 NOTE — Telephone Encounter (Signed)
She does not need to be fasting.  I do not know exactly what they are checking- but presumably basic metabolic panel which would include a glucose

## 2018-12-30 ENCOUNTER — Other Ambulatory Visit: Payer: Self-pay

## 2018-12-30 ENCOUNTER — Inpatient Hospital Stay (HOSPITAL_COMMUNITY)
Admission: EM | Admit: 2018-12-30 | Discharge: 2019-01-03 | DRG: 378 | Disposition: A | Discharge status: Home / Self Care | Payer: Medicare Other | Attending: Internal Medicine | Admitting: Internal Medicine

## 2018-12-30 ENCOUNTER — Ambulatory Visit (INDEPENDENT_AMBULATORY_CARE_PROVIDER_SITE_OTHER): Payer: Medicare Other | Admitting: Family Medicine

## 2018-12-30 ENCOUNTER — Encounter (HOSPITAL_COMMUNITY): Payer: Self-pay

## 2018-12-30 ENCOUNTER — Emergency Department (HOSPITAL_COMMUNITY): Payer: Medicare Other

## 2018-12-30 DIAGNOSIS — Z881 Allergy status to other antibiotic agents status: Secondary | ICD-10-CM

## 2018-12-30 DIAGNOSIS — N179 Acute kidney failure, unspecified: Secondary | ICD-10-CM | POA: Diagnosis not present

## 2018-12-30 DIAGNOSIS — N184 Chronic kidney disease, stage 4 (severe): Secondary | ICD-10-CM | POA: Diagnosis not present

## 2018-12-30 DIAGNOSIS — Z801 Family history of malignant neoplasm of trachea, bronchus and lung: Secondary | ICD-10-CM | POA: Diagnosis not present

## 2018-12-30 DIAGNOSIS — E1122 Type 2 diabetes mellitus with diabetic chronic kidney disease: Secondary | ICD-10-CM | POA: Diagnosis present

## 2018-12-30 DIAGNOSIS — R0602 Shortness of breath: Secondary | ICD-10-CM | POA: Diagnosis not present

## 2018-12-30 DIAGNOSIS — Z888 Allergy status to other drugs, medicaments and biological substances status: Secondary | ICD-10-CM

## 2018-12-30 DIAGNOSIS — K589 Irritable bowel syndrome without diarrhea: Secondary | ICD-10-CM | POA: Diagnosis not present

## 2018-12-30 DIAGNOSIS — B009 Herpesviral infection, unspecified: Secondary | ICD-10-CM | POA: Diagnosis present

## 2018-12-30 DIAGNOSIS — B029 Zoster without complications: Secondary | ICD-10-CM | POA: Diagnosis present

## 2018-12-30 DIAGNOSIS — E039 Hypothyroidism, unspecified: Secondary | ICD-10-CM | POA: Diagnosis not present

## 2018-12-30 DIAGNOSIS — Z79899 Other long term (current) drug therapy: Secondary | ICD-10-CM

## 2018-12-30 DIAGNOSIS — I10 Essential (primary) hypertension: Secondary | ICD-10-CM | POA: Diagnosis present

## 2018-12-30 DIAGNOSIS — Z7952 Long term (current) use of systemic steroids: Secondary | ICD-10-CM

## 2018-12-30 DIAGNOSIS — Z825 Family history of asthma and other chronic lower respiratory diseases: Secondary | ICD-10-CM

## 2018-12-30 DIAGNOSIS — D126 Benign neoplasm of colon, unspecified: Secondary | ICD-10-CM | POA: Diagnosis present

## 2018-12-30 DIAGNOSIS — E785 Hyperlipidemia, unspecified: Secondary | ICD-10-CM | POA: Diagnosis not present

## 2018-12-30 DIAGNOSIS — R531 Weakness: Secondary | ICD-10-CM | POA: Diagnosis not present

## 2018-12-30 DIAGNOSIS — K259 Gastric ulcer, unspecified as acute or chronic, without hemorrhage or perforation: Secondary | ICD-10-CM | POA: Diagnosis present

## 2018-12-30 DIAGNOSIS — K5790 Diverticulosis of intestine, part unspecified, without perforation or abscess without bleeding: Secondary | ICD-10-CM | POA: Diagnosis present

## 2018-12-30 DIAGNOSIS — R195 Other fecal abnormalities: Secondary | ICD-10-CM | POA: Diagnosis not present

## 2018-12-30 DIAGNOSIS — D649 Anemia, unspecified: Secondary | ICD-10-CM

## 2018-12-30 DIAGNOSIS — K625 Hemorrhage of anus and rectum: Principal | ICD-10-CM | POA: Diagnosis present

## 2018-12-30 DIAGNOSIS — K648 Other hemorrhoids: Secondary | ICD-10-CM | POA: Diagnosis not present

## 2018-12-30 DIAGNOSIS — D62 Acute posthemorrhagic anemia: Secondary | ICD-10-CM | POA: Diagnosis not present

## 2018-12-30 DIAGNOSIS — Z1159 Encounter for screening for other viral diseases: Secondary | ICD-10-CM

## 2018-12-30 DIAGNOSIS — D631 Anemia in chronic kidney disease: Secondary | ICD-10-CM | POA: Diagnosis not present

## 2018-12-30 DIAGNOSIS — Z7984 Long term (current) use of oral hypoglycemic drugs: Secondary | ICD-10-CM

## 2018-12-30 DIAGNOSIS — L309 Dermatitis, unspecified: Secondary | ICD-10-CM | POA: Diagnosis present

## 2018-12-30 DIAGNOSIS — Z885 Allergy status to narcotic agent status: Secondary | ICD-10-CM

## 2018-12-30 DIAGNOSIS — F411 Generalized anxiety disorder: Secondary | ICD-10-CM | POA: Diagnosis present

## 2018-12-30 DIAGNOSIS — B3781 Candidal esophagitis: Secondary | ICD-10-CM | POA: Diagnosis not present

## 2018-12-30 DIAGNOSIS — M317 Microscopic polyangiitis: Secondary | ICD-10-CM

## 2018-12-30 DIAGNOSIS — I129 Hypertensive chronic kidney disease with stage 1 through stage 4 chronic kidney disease, or unspecified chronic kidney disease: Secondary | ICD-10-CM | POA: Diagnosis present

## 2018-12-30 DIAGNOSIS — Z79890 Hormone replacement therapy (postmenopausal): Secondary | ICD-10-CM

## 2018-12-30 LAB — BASIC METABOLIC PANEL
Anion gap: 13 (ref 5–15)
BUN: 48 — AB (ref 4–21)
BUN: 53 mg/dL — ABNORMAL HIGH (ref 8–23)
CO2: 19 mmol/L — ABNORMAL LOW (ref 22–32)
Calcium: 8.8 mg/dL — ABNORMAL LOW (ref 8.9–10.3)
Chloride: 100 mmol/L (ref 98–111)
Creatinine, Ser: 2.77 mg/dL — ABNORMAL HIGH (ref 0.44–1.00)
GFR calc Af Amer: 18 mL/min — ABNORMAL LOW (ref >60)
GFR calc non Af Amer: 15 mL/min — ABNORMAL LOW (ref >60)
Glucose, Bld: 232 mg/dL — ABNORMAL HIGH (ref 70–99)
Glucose: 188
Potassium: 4.2 mmol/L (ref 3.5–5.1)
Potassium: 4.5 (ref 3.4–5.3)
Sodium: 132 mmol/L — ABNORMAL LOW (ref 135–145)
Sodium: 132 — AB (ref 137–147)

## 2018-12-30 LAB — CBC AND DIFFERENTIAL
HCT: 21 — AB (ref 36–46)
Hemoglobin: 6.7 — AB (ref 12.0–16.0)
Neutrophils Absolute: 77
Platelets: 193 (ref 150–399)
WBC: 9.1

## 2018-12-30 LAB — POC OCCULT BLOOD, ED: Fecal Occult Bld: POSITIVE — AB

## 2018-12-30 LAB — PREPARE RBC (CROSSMATCH)

## 2018-12-30 LAB — CBC
HCT: 21.8 % — ABNORMAL LOW (ref 36.0–46.0)
Hemoglobin: 6.6 g/dL — CL (ref 12.0–15.0)
MCH: 32.4 pg (ref 26.0–34.0)
MCHC: 30.3 g/dL (ref 30.0–36.0)
MCV: 106.9 fL — ABNORMAL HIGH (ref 80.0–100.0)
Platelets: 186 10*3/uL (ref 150–400)
RBC: 2.04 MIL/uL — ABNORMAL LOW (ref 3.87–5.11)
RDW: 21.2 % — ABNORMAL HIGH (ref 11.5–15.5)
WBC: 6.2 10*3/uL (ref 4.0–10.5)
nRBC: 10.4 % — ABNORMAL HIGH (ref 0.0–0.2)

## 2018-12-30 LAB — SARS CORONAVIRUS 2 BY RT PCR (HOSPITAL ORDER, PERFORMED IN ~~LOC~~ HOSPITAL LAB): SARS Coronavirus 2: NEGATIVE

## 2018-12-30 MED ORDER — ONDANSETRON HCL 4 MG/2ML IJ SOLN: 4.0000 mg | Freq: Four times a day (QID) | INTRAMUSCULAR | Status: DC | PRN

## 2018-12-30 MED ORDER — SODIUM CHLORIDE 0.9 % IV SOLN
10.0000 mL/h | Freq: Once | INTRAVENOUS | Status: AC
  Administered 2018-12-31: 10 mL/h via INTRAVENOUS

## 2018-12-30 MED ORDER — GERHARDT'S BUTT CREAM
1.0000 "application " | TOPICAL_CREAM | Freq: Three times a day (TID) | CUTANEOUS | Status: DC
  Administered 2018-12-31 – 2019-01-03 (×11): 1 via TOPICAL
  Filled 2018-12-30: qty 1

## 2018-12-30 MED ORDER — FUROSEMIDE 40 MG PO TABS: 40.0000 mg | ORAL_TABLET | ORAL | Status: DC

## 2018-12-30 MED ORDER — ACETAMINOPHEN 650 MG RE SUPP: 650.0000 mg | Freq: Four times a day (QID) | RECTAL | Status: DC | PRN

## 2018-12-30 MED ORDER — PREDNISONE 20 MG PO TABS
60.0000 mg | ORAL_TABLET | Freq: Every day | ORAL | Status: DC
  Administered 2018-12-31 – 2019-01-03 (×4): 60 mg via ORAL
  Filled 2018-12-30 (×2): qty 3
  Filled 2018-12-30: qty 1
  Filled 2018-12-30: qty 3

## 2018-12-30 MED ORDER — HYDROCHLOROTHIAZIDE 12.5 MG PO CAPS
12.5000 mg | ORAL_CAPSULE | Freq: Every day | ORAL | Status: DC
  Administered 2018-12-31 – 2019-01-03 (×4): 12.5 mg via ORAL
  Filled 2018-12-30 (×4): qty 1

## 2018-12-30 MED ORDER — ATORVASTATIN CALCIUM 10 MG PO TABS
20.0000 mg | ORAL_TABLET | Freq: Every day | ORAL | Status: DC
  Administered 2018-12-31 – 2019-01-02 (×3): 20 mg via ORAL
  Filled 2018-12-30 (×4): qty 2

## 2018-12-30 MED ORDER — AMLODIPINE BESYLATE 10 MG PO TABS
10.0000 mg | ORAL_TABLET | Freq: Every day | ORAL | Status: DC
  Administered 2018-12-31 – 2019-01-03 (×4): 10 mg via ORAL
  Filled 2018-12-30 (×4): qty 1

## 2018-12-30 MED ORDER — LEVOTHYROXINE SODIUM 50 MCG PO TABS
50.0000 ug | ORAL_TABLET | Freq: Every day | ORAL | Status: DC
  Administered 2018-12-31 – 2019-01-03 (×4): 50 ug via ORAL
  Filled 2018-12-30 (×4): qty 1

## 2018-12-30 MED ORDER — DAPSONE 100 MG PO TABS
100.0000 mg | ORAL_TABLET | Freq: Every morning | ORAL | Status: DC
  Administered 2018-12-31: 100 mg via ORAL
  Filled 2018-12-30 (×2): qty 1

## 2018-12-30 MED ORDER — PANTOPRAZOLE SODIUM 20 MG PO TBEC
20.0000 mg | DELAYED_RELEASE_TABLET | Freq: Every day | ORAL | Status: DC
  Administered 2018-12-31 – 2019-01-01 (×2): 20 mg via ORAL
  Filled 2018-12-30 (×3): qty 1

## 2018-12-30 MED ORDER — CALCITRIOL 0.25 MCG PO CAPS
0.2500 ug | ORAL_CAPSULE | ORAL | Status: DC
  Administered 2019-01-01 – 2019-01-02 (×2): 0.25 ug via ORAL
  Filled 2018-12-30 (×3): qty 1

## 2018-12-30 MED ORDER — ACETAMINOPHEN 325 MG PO TABS: 650.0000 mg | ORAL_TABLET | Freq: Four times a day (QID) | ORAL | Status: DC | PRN

## 2018-12-30 MED ORDER — METOPROLOL SUCCINATE ER 50 MG PO TB24
50.0000 mg | ORAL_TABLET | Freq: Two times a day (BID) | ORAL | Status: DC
  Administered 2018-12-31 – 2019-01-03 (×8): 50 mg via ORAL
  Filled 2018-12-30 (×8): qty 1

## 2018-12-30 MED ORDER — INSULIN ASPART 100 UNIT/ML ~~LOC~~ SOLN
0.0000 [IU] | Freq: Three times a day (TID) | SUBCUTANEOUS | Status: DC
  Administered 2018-12-31 – 2019-01-01 (×2): 2 [IU] via SUBCUTANEOUS
  Administered 2019-01-01: 1 [IU] via SUBCUTANEOUS
  Administered 2019-01-02: 5 [IU] via SUBCUTANEOUS

## 2018-12-30 MED ORDER — HYDRALAZINE HCL 50 MG PO TABS
50.0000 mg | ORAL_TABLET | Freq: Three times a day (TID) | ORAL | Status: DC
  Administered 2018-12-31 – 2019-01-03 (×11): 50 mg via ORAL
  Filled 2018-12-30 (×11): qty 1

## 2018-12-30 MED ORDER — MAGNESIUM OXIDE 400 (241.3 MG) MG PO TABS
400.0000 mg | ORAL_TABLET | Freq: Every day | ORAL | Status: DC
  Administered 2018-12-31 – 2019-01-03 (×4): 400 mg via ORAL
  Filled 2018-12-30 (×4): qty 1

## 2018-12-30 MED ORDER — ONDANSETRON HCL 4 MG PO TABS: 4.0000 mg | ORAL_TABLET | Freq: Four times a day (QID) | ORAL | Status: DC | PRN

## 2018-12-30 MED ORDER — ADULT MULTIVITAMIN W/MINERALS CH
1.0000 | ORAL_TABLET | Freq: Every day | ORAL | Status: DC
  Administered 2018-12-31 – 2019-01-03 (×4): 1 via ORAL
  Filled 2018-12-30 (×4): qty 1

## 2018-12-30 MED ORDER — GEMFIBROZIL 600 MG PO TABS
600.0000 mg | ORAL_TABLET | Freq: Every morning | ORAL | Status: DC
  Administered 2018-12-31 – 2019-01-03 (×4): 600 mg via ORAL
  Filled 2018-12-30 (×4): qty 1

## 2018-12-30 NOTE — ED Notes (Signed)
Type and Screen redrawn by RN

## 2018-12-30 NOTE — ED Notes (Signed)
Date and time results received: 12/30/18 7:38 PM   Test: Hmg Critical Value: 6.6  Name of Provider Notified: Regenia Skeeter  Orders Received? Or Actions Taken?: none at this time

## 2018-12-30 NOTE — Telephone Encounter (Signed)
Would reach out to [place].  We need to get their daughter on DPR if that is their desire.  OK to proceed with the company of their choice.

## 2018-12-30 NOTE — Telephone Encounter (Signed)
Daughter Yetta Flock calling to ask you to send the order for home health services to [facility].  [facility] fax: [phone]  If you have any questions, you can call Alyssa.  She states she does not want to work with [facility], they majorly dropped the ball. They are going to work with [employer].

## 2018-12-30 NOTE — Telephone Encounter (Signed)
Alyssa is not on the DPR or listed in patients chart??   Please advise if I need to reach out to son Marden Noble or contact [name]?

## 2018-12-30 NOTE — Progress Notes (Signed)
Patient ID: Emily Cannon, female   DOB: [birth year], 85 y.o.   MRN: [id number]  This visit type was conducted due to national recommendations for restrictions regarding the COVID-19 pandemic in an effort to limit this patient's exposure and mitigate transmission in our community.   Virtual Visit via Telephone Note  I connected with Emily Cannon on 12/30/18 at  9:00 AM EDT by telephone and verified that I am speaking with the correct person using two identifiers.   I discussed the limitations, risks, security and privacy concerns of performing an evaluation and management service by telephone and the availability of in person appointments. I also discussed with the patient that there may be a patient responsible charge related to this service. The patient expressed understanding and agreed to proceed.  Location patient: home Location provider: work or home office Participants present for the call: patient, provider Patient did not have a visit in the prior 7 days to address this/these issue(s).   History of Present Illness:   Spoke with husband today regarding update.  Refer to note from 12/22/2018 for details.  This outlines details of her recent admission.  Since that time she has been relatively stable.  He states that overall she is fairly debilitated.  She is requiring assistance with things like transfers.  She is ambulating with a walker.  He is struggling because of his own health issues.  He states his wife is "frail ".  She has had reported 10 pound weight loss.  She has had a painful skin rash which was diagnosed by dermatology as possible shingles.  She is followed closely by nephrology.  Her creatinine is up to 2.65 May 29 up from 2.38 May 26.  She also had urine culture through renal which grew out Citrobacter species.  She apparently was treated with Cipro 250 mg for 7 days.  Recent hemoglobin 7.9 which is stable.  She is closely followed by nephrology and rheumatology for her  microscopic polyangiitis.  She does have hypothyroidism is due for repeat TSH.  We had made referral to home health to try to get some assistance for her husband and this is still pending.  They have still not been out at this point.  Apparently 1 of her daughters is a Marine scientist and she has her own ideas about who they would like to get to come out for home health.   Observations/Objective: Patient sounds cheerful and well on the phone. I do not appreciate any SOB. Speech and thought processing are grossly intact. Patient reported vitals:  Assessment and Plan:  #1 microscopic polyangiitis with multiple manifestations including vasculitic rash, renal insufficiency, anemia, pulmonary manifestations which are currently stable -Continue close follow-up with rheumatology and nephrology  #2 hypothyroidism -Needs follow-up TSH.  Hopefully she can get this with 1 of her future blood draws when she is getting other parameters monitored  #3 hypertension.  Husband is encouraged to monitor this closely.  We need to be careful she is not becoming hypotensive with her recent weight loss.  Follow Up Instructions:  -Continue close follow-up with nephrology and rheumatology as above   [id number] [id number] [id number] 11-20 [id number] 21-30 I did not refer this patient for an OV in the next 24 hours for this/these issue(s).  I discussed the assessment and treatment plan with the patient. The patient was provided an opportunity to ask questions and all were answered. The patient agreed with the plan and demonstrated an understanding of the instructions.   The patient  was advised to call back or seek an in-person evaluation if the symptoms worsen or if the condition fails to improve as anticipated.  I provided 25 minutes of non-face-to-face time during this encounter.   Carolann Littler, MD

## 2018-12-30 NOTE — H&P (Addendum)
History and Physical    Emily Cannon SHF:[id number] DOB: [birth year] DOA: 12/30/2018  PCP: Eulas Post, MD   Patient coming from: Home.  Chief Complaint: Low hemoglobin.  HPI: Emily Cannon is a 85 y.o. female with history of chronic kidney disease with microscopic polyangiitis on prednisone, diabetes mellitus, hypertension was recently admitted for symptomatic anemia and at the time of discharge hemoglobin was around 9.2 on Dec 19, 2018 that is around 10 days ago had routine labs done by patient's nephrologist and was found to hemoglobin around 6 and was advised to come to the ER.  Patient has been recently diagnosed with herpes zoster of the buttock area and has been on valacyclovir for 2 weeks by patient's dermatologist.  Her lesions have healed but has had been some skin cracking around the anal area from the previous lesions and hematologist has advised to continue the valacyclovir for 1 more week.  Otherwise denies any chest pain or shortness of breath nausea vomiting or abdominal pain.  ED Course: In the ER patient's creatinine is increased from 2.210 days ago to 2.7.  Hemoglobin 6.6 platelets 186 UA is pending.  On exam patient has bilateral lower extremity edema.  Per patient son was at the bedside patient's Lasix dose was recently increased by nephrologist.  Nephrologist on-call has been consulted patient admitted for acute blood loss anemia.  Stool for occult blood was positive.  Review of Systems: As per HPI, rest all negative.   Past Medical History:  Diagnosis Date  . Acquired absence of organ, genital organs   . Allergic rhinitis, cause unspecified   . Anxiety state, unspecified   . Disorder of bone and cartilage, unspecified   . Hypothyroid   . Irritable bowel syndrome   . Lumbago   . Other and unspecified hyperlipidemia   . Perforation of tympanic membrane, unspecified   . Unspecified essential hypertension     Past Surgical History:  Procedure Laterality  Date  . ABDOMINAL HYSTERECTOMY    . cataract surgery  5/12 and 12/24/2012   both eyes  . TONSILLECTOMY       reports that she has never smoked. She has never used smokeless tobacco. She reports that she does not drink alcohol or use drugs.  Allergies  Allergen Reactions  . Alendronate Sodium Shortness Of Breath  . Amoxicillin Diarrhea    Severe diarrhea  . Codeine Nausea Only  . Valsartan Swelling    Gums/throat swell    Family History  Problem Relation Age of Onset  . Lung cancer Mother 58  . Lung disease Father 49  . COPD Other        sibling  . Colon cancer Neg Hx     Prior to Admission medications   Medication Sig Start Date End Date Taking? Authorizing Provider  amLODipine (NORVASC) 10 MG tablet TAKE ONE TABLET BY MOUTH DAILY Patient taking differently: Take 10 mg by mouth daily.  10/12/18  Yes Burchette, Alinda Sierras, MD  atorvastatin (LIPITOR) 20 MG tablet TAKE ONE TABLET BY MOUTH DAILY Patient taking differently: Take 20 mg by mouth daily.  10/12/18  Yes Burchette, Alinda Sierras, MD  calcitRIOL (ROCALTROL) 0.25 MCG capsule Take 0.25 mcg by mouth every Monday, Wednesday, and Friday.  12/28/18  Yes [provider]  Cholecalciferol (VITAMIN D-3 PO) Take 1 capsule by mouth every Monday, Wednesday, and Friday.   Yes [provider]  dapsone 100 MG tablet Take 100 mg by mouth every morning.  12/22/18  Yes [provider]  furosemide (LASIX) 40 MG tablet Take 0.5 tablets (20 mg total) by mouth 3 (three) times a week. Monday, Wed, Friday Patient taking differently: Take 40 mg by mouth every Monday, Wednesday, and Friday.  12/18/18  Yes Nita Sells, MD  gemfibrozil (LOPID) 600 MG tablet TAKE ONE TABLET BY MOUTH DAILY Patient taking differently: Take 600 mg by mouth every morning.  10/12/18  Yes Burchette, Alinda Sierras, MD  glipiZIDE (GLUCOTROL) 5 MG tablet Take 2.5 mg by mouth daily before breakfast. 12/22/18  Yes [provider]  hydrALAZINE (APRESOLINE)  50 MG tablet Take 1 tablet (50 mg total) by mouth 3 (three) times daily. 12/18/18  Yes Nita Sells, MD  hydrochlorothiazide (MICROZIDE) 12.5 MG capsule Take 12.5 mg by mouth daily. 10/12/18  Yes [provider]  Hydrocortisone (GERHARDT'S BUTT CREAM) CREA Apply 1 application topically 3 (three) times daily. Patient taking differently: Apply 1 application topically 3 (three) times daily. TO AFFECTED SITES 12/18/18  Yes Nita Sells, MD  levothyroxine (SYNTHROID, LEVOTHROID) 50 MCG tablet TAKE ONE TABLET BY MOUTH EVERY MORNING BEFORE BREAKFAST Patient taking differently: Take 50 mcg by mouth daily before breakfast.  10/12/18  Yes Burchette, Alinda Sierras, MD  magnesium oxide (MAG-OX) 400 MG tablet Take 400 mg by mouth daily with breakfast.   Yes [provider]  metoprolol succinate (TOPROL-XL) 50 MG 24 hr tablet TAKE ONE TABLET BY MOUTH TWICE A DAY Patient taking differently: Take 50 mg by mouth 2 (two) times a day.  09/17/18  Yes Burchette, Alinda Sierras, MD  Multiple Vitamins-Minerals (CENTRUM SILVER 50+WOMEN) TABS Take 1 tablet by mouth daily with breakfast.   Yes [provider]  pantoprazole (PROTONIX) 20 MG tablet Take 1 tablet (20 mg total) by mouth daily. 12/19/18  Yes Nita Sells, MD  predniSONE (DELTASONE) 20 MG tablet Take 3 tablets (60 mg total) by mouth daily before breakfast. 12/19/18  Yes Nita Sells, MD  Blood Glucose Monitoring Suppl (ONETOUCH VERIO) w/Device KIT Use to test blood glucose once daily 12/24/18   Burchette, Alinda Sierras, MD  glucose blood (ONETOUCH VERIO) test strip Use as instructed to check blood glucose once daily 12/24/18   Burchette, Alinda Sierras, MD  potassium chloride SA (K-DUR) 20 MEQ tablet Take 2 tablets (40 mEq total) by mouth 2 (two) times daily. Patient not taking: Reported on 12/30/2018 12/19/18   Nita Sells, MD    Physical Exam: Vitals:   12/30/18 2115 12/30/18 2152 12/30/18 2153 12/30/18 2155  BP:  (!) 138/56  134/65 (!) 138/56  Pulse: 75 74 76 71  Resp: 20 14 15  (!) 22  Temp:  (!) 97.5 F (36.4 C)    TempSrc:  Oral    SpO2: 93% 92% 90% 92%      Constitutional: Moderately built and nourished. Vitals:   12/30/18 2115 12/30/18 2152 12/30/18 2153 12/30/18 2155  BP:  (!) 138/56 134/65 (!) 138/56  Pulse: 75 74 76 71  Resp: 20 14 15  (!) 22  Temp:  (!) 97.5 F (36.4 C)    TempSrc:  Oral    SpO2: 93% 92% 90% 92%   Eyes: Anicteric no pallor. ENMT: No discharge from the ears eyes nose or mouth. Neck: No mass felt.  No neck rigidity.  No JVD appreciated. Respiratory: No rhonchi or crepitations. Cardiovascular: S1-S2 heard. Abdomen: Soft nontender bowel sounds present. Musculoskeletal: Bilateral ex lower extremity edema present. Skin: Bilateral buttocks has skin rash which appears to be healed. Neurologic: Alert awake oriented to time  place and person.  Moves all extremities. Psychiatric: Appears normal.   Labs on Admission: I have personally reviewed following labs and imaging studies  CBC: Recent Labs  Lab 12/30/18 1845  WBC 6.2  HGB 6.6*  HCT 21.8*  MCV 106.9*  PLT 962   Basic Metabolic Panel: Recent Labs  Lab 12/30/18 1845  NA 132*  K 4.2  CL 100  CO2 19*  GLUCOSE 232*  BUN 53*  CREATININE 2.77*  CALCIUM 8.8*   GFR: Estimated Creatinine Clearance: 12.6 mL/min (A) (by C-G formula based on SCr of 2.77 mg/dL (H)). Liver Function Tests: No results for input(s): AST, ALT, ALKPHOS, BILITOT, PROT, ALBUMIN in the last 168 hours. No results for input(s): LIPASE, AMYLASE in the last 168 hours. No results for input(s): AMMONIA in the last 168 hours. Coagulation Profile: No results for input(s): INR, PROTIME in the last 168 hours. Cardiac Enzymes: No results for input(s): CKTOTAL, CKMB, CKMBINDEX, TROPONINI in the last 168 hours. BNP (last 3 results) No results for input(s): PROBNP in the last 8760 hours. HbA1C: No results for input(s): HGBA1C in the last 72 hours. CBG:  No results for input(s): GLUCAP in the last 168 hours. Lipid Profile: No results for input(s): CHOL, HDL, LDLCALC, TRIG, CHOLHDL, LDLDIRECT in the last 72 hours. Thyroid Function Tests: No results for input(s): TSH, T4TOTAL, FREET4, T3FREE, THYROIDAB in the last 72 hours. Anemia Panel: No results for input(s): VITAMINB12, FOLATE, FERRITIN, TIBC, IRON, RETICCTPCT in the last 72 hours. Urine analysis:    Component Value Date/Time   COLORURINE YELLOW 12/17/2018 1500   APPEARANCEUR TURBID (A) 12/17/2018 1500   LABSPEC 1.009 12/17/2018 1500   PHURINE 6.0 12/17/2018 1500   GLUCOSEU 50 (A) 12/17/2018 1500   GLUCOSEU NEGATIVE 10/07/2018 1228   HGBUR LARGE (A) 12/17/2018 1500   BILIRUBINUR NEGATIVE 12/17/2018 1500   KETONESUR NEGATIVE 12/17/2018 1500   PROTEINUR 100 (A) 12/17/2018 1500   UROBILINOGEN 0.2 10/07/2018 1228   NITRITE NEGATIVE 12/17/2018 1500   LEUKOCYTESUR LARGE (A) 12/17/2018 1500   Sepsis Labs: '@LABRCNTIP'$ (procalcitonin:4,lacticidven:4) )No results found for this or any previous visit (from the past 240 hour(s)).   Radiological Exams on Admission: Dg Chest Portable 1 View  Result Date: 12/30/2018 CLINICAL DATA:  Week with shortness of breath. Hypertension. Acute kidney injury. EXAM: PORTABLE CHEST 1 VIEW COMPARISON:  12/15/2018. FINDINGS: Heart size upper limits normal. No consolidation or edema. Calcified tortuous aorta. No effusion or pneumothorax. Improved cardiac size from priors. IMPRESSION: No active disease. Electronically Signed   By: Staci Righter M.D.   On: 12/30/2018 21:29     Assessment/Plan Principal Problem:   Acute blood loss anemia Active Problems:   Essential hypertension   Hypothyroidism   Microscopic polyangiitis (H[facility])   AKI (acute kidney injury) ([place])    1. Acute blood loss anemia -1 unit of PRBC transfusion was ordered in the ER.  Recent anemia panel did not show any definite signs of iron deficiency.  Patient does have blood loss from the skin  lesions from recent treatment of herpes.  Could be the source but would also have to look into GI since patient son states there was some blood in the commode recently after bowel movement.  Repeat CBC in the morning may consult GI. 2. Acute on chronic disease with microscopic polyangiitis on prednisone at this time as received rituximab previously.  Nephrology consult appreciated.  UA is pending follow metabolic panel.  Patient is also on Lasix which nephrologist advised to continue. 3. Hypothyroidism  on Synthroid. 4. Hypertension on amlodipine metoprolol hydralazine hydrochlorothiazide. 5. Hyperlipidemia on statins and gemfibrozil. 6. Recently treated herpes zoster of the buttock area.  Patient saw dermatology advised to continue with valacyclovir for 1 more week.  Has 6 more days to go.  Discussed with pharmacist about dosing valacyclovir and recommended 1 g daily for next 6 days.  I have discussed with hospital [facility] that patient is getting treated for herpes zoster but no active lesion.  AC advised patient may not need airborne precautions given that there is no active lesion.   DVT prophylaxis: SCD since patient has some blood loss anemia. Code Status: Full code.  Confirmed with patient's son and patient. Family Communication: Patient's son. Disposition Plan: Home. Consults called: Nephrology. Admission status: Observation.   Rise Patience MD [facility] Pager [phone].  If 7PM-7AM, please contact night-coverage [email or web address] Password [email or web address]  12/30/2018, 10:06 PM

## 2018-12-30 NOTE — ED Notes (Signed)
Delay in pt transport. Md at bedside to confirm pt placement.

## 2018-12-30 NOTE — ED Notes (Signed)
MD at bedside. 

## 2018-12-30 NOTE — Consult Note (Signed)
Reason for Consult: CKD and MPA Referring Physician:  Dr. Sherwood Cannon  Chief Complaint: Anemia  Assessment/Plan: 1. AKI on CKD IV with baseline creatinine 2.2-2.5. May be daily fluctuation as she is not far off her baseline vs CHF vs MPA. - Strict I&O's, daily weights, urine studies.  - Will send off a urine microscopy to check for activity (low on differential); she has already received Rituximab qweekly x4 + still on high dose Prednisone. - Keep even for now given the lower ext edema; would continue the Lasix for now. 2. MPA - s/p Rituximab qweekly x4 + high dose Prendisone 72m daily. No e/o activity; she feels much  Better than at time of original diagnosis and no resp sxs. 3. Anemia - will be transfused and merits GI w/u with the +heme occult this time and recent admission for transfusion. 4. HTN - controlled.    HPI: Emily CROWis an 85y.o. female woman with a history of progressive renal insufficiency in the setting of a positive ANCA diagnosed with microscopic polyangiitis.  [facility]  She recently presented to the ED with a hemoglobin of 5.5 and was transfused 2 units of PRBC with appropriate rise in H/H.  Fecal occult testing was negative at that time.  She was afebrile.  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 BAmil Cannon  She is being treated with Prednisone 640mdaily and was sent to the ED after [facility] and they do report blood in the stool, dark black as well as bright red blood. She feels well though with no cough, hemoptysis, dyspnea, anorexia but has had some malaise and fatigue. She denies fever, chills, sore throat or myalgias.  ROS Pertinent items are noted in HPI.  Chemistry and CBC: Creatinine  Date/Time Value Ref  Range Status  10/01/2018 02:26 PM 1.50 (H) 0.44 - 1.00 mg/dL Final   Creatinine, Ser  Date/Time Value Ref Range Status  12/30/2018 06:45 PM 2.77 (H) 0.44 - 1.00 mg/dL Final  12/19/2018 08:02 AM 2.27 (H) 0.44 - 1.00 mg/dL Final  12/18/2018 06:30 AM 2.28 (H) 0.44 - 1.00 mg/dL Final  12/17/2018 04:19 AM 2.63 (H) 0.44 - 1.00 mg/dL Final  12/16/2018 07:44 AM 2.41 (H) 0.44 - 1.00 mg/dL Final  12/15/2018 02:52 PM 2.49 (H) 0.44 - 1.00 mg/dL Final  10/07/2018 12:28 PM 1.60 (H) 0.40 - 1.20 mg/dL Final  09/09/2018 10:51 AM 0.91 0.40 - 1.20 mg/dL Final  12/26/2017 11:02 AM 0.82 0.40 - 1.20 mg/dL Final  06/27/2017 10:44 AM 0.81 0.40 - 1.20 mg/dL Final  12/04/2016 11:04 AM 0.76 0.40 - 1.20 mg/dL Final  05/31/2016 10:37 AM 0.78 0.40 - 1.20 mg/dL Final  12/06/2015 11:18 AM 0.81 0.40 - 1.20 mg/dL Final  06/08/2015 09:36 AM 0.89 0.40 - 1.20 mg/dL Final  01/18/2015 01:44 PM 0.79 0.40 - 1.20 mg/dL Final  06/29/2014 11:27 AM 0.8 0.4 - 1.2 mg/dL Final  05/30/2014 09:47 AM 0.8 0.4 - 1.2 mg/dL Final  11/30/2013 09:56 AM 0.8 0.4 - 1.2 mg/dL Final  10/29/2013 06:32 PM 0.90 0.50 - 1.10 mg/dL Final  05/26/2013 11:19 AM 0.7 0.4 - 1.2 mg/dL Final  11/23/2012 09:54 AM 0.8 0.4 - 1.2 mg/dL Final  05/25/2012 10:01 AM 0.7 0.4 -  1.2 mg/dL Final  11/21/2011 09:57 AM 0.7 0.4 - 1.2 mg/dL Final  05/14/2011 08:33 AM 0.7 0.4 - 1.2 mg/dL Final  10/31/2010 10:17 AM 0.8 0.4 - 1.2 mg/dL Final  05/02/2010 08:49 AM 0.9 0.4 - 1.2 mg/dL Final  11/09/2009 11:28 AM 0.7 0.4 - 1.2 mg/dL Final  07/27/2009 12:00 AM 0.8 0.4 - 1.2 mg/dL Final  01/23/2009 10:36 AM 0.8 0.4 - 1.2 mg/dL Final  07/26/2008 09:34 AM 0.8 0.4 - 1.2 mg/dL Final  07/27/2007 09:52 AM 0.9 0.4 - 1.2 mg/dL Final   Recent Labs  Lab 12/30/18 1845  NA 132*  K 4.2  CL 100  CO2 19*  GLUCOSE 232*  BUN 53*  CREATININE 2.77*  CALCIUM 8.8*   Recent Labs  Lab 12/30/18 1845  WBC 6.2  HGB 6.6*  HCT 21.8*  MCV 106.9*  PLT 186   Liver Function Tests: No  results for input(s): AST, ALT, ALKPHOS, BILITOT, PROT, ALBUMIN in the last 168 hours. No results for input(s): LIPASE, AMYLASE in the last 168 hours. No results for input(s): AMMONIA in the last 168 hours. Cardiac Enzymes: No results for input(s): CKTOTAL, CKMB, CKMBINDEX, TROPONINI in the last 168 hours. Iron Studies: No results for input(s): IRON, TIBC, TRANSFERRIN, FERRITIN in the last 72 hours. PT/INR: _0 (inr:5)  Xrays/Other Studies: ) Results for orders placed or performed during the hospital encounter of 12/30/18 (from the past 48 hour(s))  CBC     Status: Abnormal   Collection Time: 12/30/18  6:45 PM  Result Value Ref Range   WBC 6.2 4.0 - 10.5 K/uL   RBC 2.04 (L) 3.87 - 5.11 MIL/uL   Hemoglobin 6.6 (LL) 12.0 - 15.0 g/dL    Comment: REPEATED TO VERIFY THIS CRITICAL RESULT HAS VERIFIED AND BEEN CALLED TO T WISEMAN,RN BY WALTER BOND ON 06 03 2020 AT 1933, AND HAS BEEN READ BACK.     HCT 21.8 (L) 36.0 - 46.0 %   MCV 106.9 (H) 80.0 - 100.0 fL   MCH 32.4 26.0 - 34.0 pg   MCHC 30.3 30.0 - 36.0 g/dL   RDW 21.2 (H) 11.5 - 15.5 %   Platelets 186 150 - 400 K/uL   nRBC 10.4 (H) 0.0 - 0.2 %    Comment: Performed at [facility] [address], [place], [place] [address]  Basic metabolic panel     Status: Abnormal   Collection Time: 12/30/18  6:45 PM  Result Value Ref Range   Sodium 132 (L) 135 - 145 mmol/L   Potassium 4.2 3.5 - 5.1 mmol/L   Chloride 100 98 - 111 mmol/L   CO2 19 (L) 22 - 32 mmol/L   Glucose, Bld 232 (H) 70 - 99 mg/dL   BUN 53 (H) 8 - 23 mg/dL   Creatinine, Ser 2.77 (H) 0.44 - 1.00 mg/dL   Calcium 8.8 (L) 8.9 - 10.3 mg/dL   GFR calc non Af Amer 15 (L) >60 mL/min   GFR calc Af Amer 18 (L) >60 mL/min   Anion gap 13 5 - 15    Comment: Performed at [facility] [address], [place], [place] [address]  Type and screen Ordered by PROVIDER DEFAULT     Status: None (Preliminary result)   Collection Time: 12/30/18  7:57 PM  Result Value Ref  Range   ABO/RH(D) PENDING    Antibody Screen PENDING    Sample Expiration      01/02/2019,2359 Performed at [facility], [address] [place], [place] [address]   POC occult blood, ED Provider will collect     Status: Abnormal   Collection Time: 12/30/18  8:28 PM  Result Value Ref Range   Fecal Occult Bld POSITIVE (A) NEGATIVE  Prepare RBC     Status: None   Collection Time: 12/30/18  9:00 PM  Result Value Ref Range   Order Confirmation      ORDER PROCESSED BY BLOOD BANK Performed at [facility], [place] [address], [place], [place] [address]    No results found.  PMH:   Past Medical History:  Diagnosis Date  . Acquired absence of organ, genital organs   . Allergic rhinitis, cause unspecified   . Anxiety state, unspecified   . Disorder of bone and cartilage, unspecified   . Hypothyroid   . Irritable bowel syndrome   . Lumbago   . Other and unspecified hyperlipidemia   . Perforation of tympanic membrane, unspecified   . Unspecified essential hypertension     PSH:   Past Surgical History:  Procedure Laterality Date  . ABDOMINAL HYSTERECTOMY    . cataract surgery  5/12 and 12/24/2012   both eyes  . TONSILLECTOMY      Allergies:  Allergies  Allergen Reactions  . Alendronate Sodium     REACTION: pt states "short of breath"  . Amoxicillin     REACTION: causes severe diarrhea  . Codeine     REACTION: pt states nausea  . Valsartan     REACTION: gums/throat swelling    Medications:   Prior to Admission medications   Medication Sig Start Date End Date Taking? Authorizing Provider  amLODipine (NORVASC) 10 MG tablet TAKE ONE TABLET BY MOUTH DAILY 10/12/18   Burchette, Alinda Sierras, MD  atorvastatin (LIPITOR) 20 MG tablet TAKE ONE TABLET BY MOUTH DAILY 10/12/18   Burchette, Alinda Sierras, MD  Blood Glucose Monitoring Suppl (ONETOUCH VERIO) w/Device KIT Use to test blood glucose once daily 12/24/18   Burchette, Alinda Sierras, MD  Cholecalciferol (VITAMIN D) 50 MCG (2000 UT)  CAPS Take 2,000 Units by mouth daily.    [provider]  furosemide (LASIX) 40 MG tablet Take 0.5 tablets (20 mg total) by mouth 3 (three) times a week. Monday, Wed, Friday 12/18/18   Nita Sells, MD  gemfibrozil (LOPID) 600 MG tablet TAKE ONE TABLET BY MOUTH DAILY 10/12/18   Burchette, Alinda Sierras, MD  glucose blood (ONETOUCH VERIO) test strip Use as instructed to check blood glucose once daily 12/24/18   Burchette, Alinda Sierras, MD  hydrALAZINE (APRESOLINE) 50 MG tablet Take 1 tablet (50 mg total) by mouth 3 (three) times daily. 12/18/18   Nita Sells, MD  Hydrocortisone (GERHARDT'S BUTT CREAM) CREA Apply 1 application topically 3 (three) times daily. 12/18/18   Nita Sells, MD  levothyroxine (SYNTHROID, LEVOTHROID) 50 MCG tablet TAKE ONE TABLET BY MOUTH EVERY MORNING BEFORE BREAKFAST 10/12/18   Burchette, Alinda Sierras, MD  metoprolol succinate (TOPROL-XL) 50 MG 24 hr tablet TAKE ONE TABLET BY MOUTH TWICE A DAY 09/17/18   Burchette, Alinda Sierras, MD  pantoprazole (PROTONIX) 20 MG tablet Take 1 tablet (20 mg total) by mouth daily. 12/19/18   Nita Sells, MD  potassium chloride SA (K-DUR) 20 MEQ tablet Take 2 tablets (40 mEq total) by mouth 2 (two) times daily. 12/19/18   Nita Sells, MD  predniSONE (DELTASONE) 20 MG tablet Take 3 tablets (60 mg total) by mouth daily before breakfast. 12/19/18   Nita Sells, MD  sodium bicarbonate 650 MG tablet Take 650 mg by mouth 2 (two) times daily.    [provider]    Discontinued Meds:  There are no discontinued medications.  Social History:  reports that she has never smoked. She has never used smokeless tobacco. She reports that she does not drink alcohol or use drugs.  Family History:   Family History  Problem Relation Age of Onset  . Lung cancer Mother 31  . Lung disease Father 40  . COPD Other        sibling  . Colon cancer Neg Hx     Blood pressure 130/61, pulse 70, temperature 97.9 F (36.6 C),  temperature source Oral, resp. rate (!) 22, SpO2 91 %. General appearance: alert, cooperative and appears stated age Head: Normocephalic, without obvious abnormality, atraumatic Eyes: negative Neck: no adenopathy, no carotid bruit, no JVD, supple, symmetrical, trachea midline and thyroid not enlarged, symmetric, no tenderness/mass/nodules Back: symmetric, no curvature. ROM normal. No CVA tenderness. Resp: clear to auscultation bilaterally Cardio: regular rate and rhythm, S1, S2 normal, no murmur, click, rub or gallop GI: soft, non-tender; bowel sounds normal; no masses,  no organomegaly Extremities: edema 1+ Pulses: 2+ and symmetric Skin: Skin color, texture, turgor normal. No rashes or lesions Lymph nodes: Cervical, supraclavicular, and axillary nodes normal. Neurologic: Grossly normal       Kamrie Fanton, Hunt Oris, MD 12/30/2018, 8:58 PM

## 2018-12-30 NOTE — ED Triage Notes (Signed)
Pt sent from nephrologists office ofr low Hgb.  A&O, son with patient who states pt has hard time explaining what is going on.  Son to stay with pt at bedside.

## 2018-12-30 NOTE — ED Notes (Signed)
ED TO INPATIENT HANDOFF REPORT  ED Nurse Name and Phone #: [phone]  S Name/Age/Gender Emily Cannon 85 y.o. female Room/Bed: 044C/044C  Code Status   Code Status: Full Code  Home/SNF/Other Home Patient oriented to: self, place, time and situation Is this baseline? Yes   Triage Complete: Triage complete  Chief Complaint low hemoglobin  Triage Note Pt sent from nephrologists office ofr low Hgb.  A&O, son with patient who states pt has hard time explaining what is going on.  Son to stay with pt at bedside.     Allergies Allergies  Allergen Reactions  . Alendronate Sodium Shortness Of Breath  . Amoxicillin Diarrhea    Severe diarrhea  . Codeine Nausea Only  . Valsartan Swelling    Gums/throat swell    Level of Care/Admitting Diagnosis ED Disposition    ED Disposition Condition Comment   Admit  Hospital Area: [facility] [[id number]]  Level of Care: Telemetry Medical [104]  I expect the patient will be discharged within 24 hours: No (not a candidate for 5C-Observation unit)  Covid Evaluation: Screening Protocol (No Symptoms)  Diagnosis: Acute blood loss anemia [147829]  Admitting Physician: Rise Patience [phone]  Attending Physician: Rise Patience [email or web address]  PT Class (Do Not Modify): Observation [104]  PT Acc Code (Do Not Modify): Observation [10022]       B Medical/Surgery History Past Medical History:  Diagnosis Date  . Acquired absence of organ, genital organs   . Allergic rhinitis, cause unspecified   . Anxiety state, unspecified   . Disorder of bone and cartilage, unspecified   . Hypothyroid   . Irritable bowel syndrome   . Lumbago   . Other and unspecified hyperlipidemia   . Perforation of tympanic membrane, unspecified   . Unspecified essential hypertension    Past Surgical History:  Procedure Laterality Date  . ABDOMINAL HYSTERECTOMY    . cataract surgery  5/12 and 12/24/2012   both eyes  . TONSILLECTOMY        A IV Location/Drains/Wounds Patient Lines/Drains/Airways Status   Active Line/Drains/Airways    Name:   Placement date:   Placement time:   Site:   Days:   Peripheral IV 12/30/18 Right Antecubital   12/30/18    2200    Antecubital   less than 1   Peripheral IV 12/30/18 Right Antecubital   12/30/18    2150    Antecubital   less than 1   Wound / Incision (Open or Dehisced) 12/16/18 Non-pressure wound Buttocks Bilateral Open ulceration vs ruptured blisters   12/16/18    0200    Buttocks   14          Intake/Output Last 24 hours No intake or output data in the 24 hours ending 12/30/18 2258  Labs/Imaging Results for orders placed or performed during the hospital encounter of 12/30/18 (from the past 48 hour(s))  CBC     Status: Abnormal   Collection Time: 12/30/18  6:45 PM  Result Value Ref Range   WBC 6.2 4.0 - 10.5 K/uL   RBC 2.04 (L) 3.87 - 5.11 MIL/uL   Hemoglobin 6.6 (LL) 12.0 - 15.0 g/dL    Comment: REPEATED TO VERIFY THIS CRITICAL RESULT HAS VERIFIED AND BEEN CALLED TO T WISEMAN,RN BY WALTER BOND ON 06 03 2020 AT 1933, AND HAS BEEN READ BACK.     HCT 21.8 (L) 36.0 - 46.0 %   MCV 106.9 (H) 80.0 - 100.0 fL   MCH  32.4 26.0 - 34.0 pg   MCHC 30.3 30.0 - 36.0 g/dL   RDW 21.2 (H) 11.5 - 15.5 %   Platelets 186 150 - 400 K/uL   nRBC 10.4 (H) 0.0 - 0.2 %    Comment: Performed at [facility] [address] [place], [place] [address]  Basic metabolic panel     Status: Abnormal   Collection Time: 12/30/18  6:45 PM  Result Value Ref Range   Sodium 132 (L) 135 - 145 mmol/L   Potassium 4.2 3.5 - 5.1 mmol/L   Chloride 100 98 - 111 mmol/L   CO2 19 (L) 22 - 32 mmol/L   Glucose, Bld 232 (H) 70 - 99 mg/dL   BUN 53 (H) 8 - 23 mg/dL   Creatinine, Ser 2.77 (H) 0.44 - 1.00 mg/dL   Calcium 8.8 (L) 8.9 - 10.3 mg/dL   GFR calc non Af Amer 15 (L) >60 mL/min   GFR calc Af Amer 18 (L) >60 mL/min   Anion gap 13 5 - 15    Comment: Performed at [facility] [address],  [place], [place] [address]  Type and screen Ordered by PROVIDER DEFAULT     Status: None (Preliminary result)   Collection Time: 12/30/18  8:26 PM  Result Value Ref Range   ABO/RH(D) A POS    Antibody Screen NEG    Sample Expiration 01/02/2019,2359    Unit Number [id number]    Blood Component Type RED CELLS,LR    Unit division 00    Status of Unit ISSUED    Transfusion Status OK TO TRANSFUSE    Crossmatch Result      Compatible Performed at [facility], [place] [address] [place], [place] [address]   POC occult blood, ED Provider will collect     Status: Abnormal   Collection Time: 12/30/18  8:28 PM  Result Value Ref Range   Fecal Occult Bld POSITIVE (A) NEGATIVE  Prepare RBC     Status: None   Collection Time: 12/30/18  9:00 PM  Result Value Ref Range   Order Confirmation      ORDER PROCESSED BY BLOOD BANK Performed at [facility], [place] [address] [place], [place] [address]   SARS Coronavirus 2 (CEPHEID - Performed in [facility] hospital lab), Hosp Order     Status: None   Collection Time: 12/30/18  9:25 PM  Result Value Ref Range   SARS Coronavirus 2 NEGATIVE NEGATIVE    Comment: (NOTE) If result is NEGATIVE SARS-CoV-2 target nucleic acids are NOT DETECTED. The SARS-CoV-2 RNA is generally detectable in upper and lower  respiratory specimens during the acute phase of infection. The lowest  concentration of SARS-CoV-2 viral copies this assay can detect is 250  copies / mL. A negative result does not preclude SARS-CoV-2 infection  and should not be used as the sole basis for treatment or other  patient management decisions.  A negative result may occur with  improper specimen collection / handling, submission of specimen other  than nasopharyngeal swab, presence of viral mutation(s) within the  areas targeted by this assay, and inadequate number of viral copies  (<250 copies / mL). A negative result must be combined with clinical  observations, patient history, and  epidemiological information. If result is POSITIVE SARS-CoV-2 target nucleic acids are DETECTED. The SARS-CoV-2 RNA is generally detectable in upper and lower  respiratory specimens dur ing the acute phase of infection.  Positive  results are indicative of active infection with SARS-CoV-2.  Clinical  correlation with patient history and other diagnostic information is  necessary to determine patient infection status.  Positive results do  not rule out bacterial infection or co-infection with other viruses. If result is PRESUMPTIVE POSTIVE SARS-CoV-2 nucleic acids MAY BE PRESENT.   A presumptive positive result was obtained on the submitted specimen  and confirmed on repeat testing.  While 2019 novel coronavirus  (SARS-CoV-2) nucleic acids may be present in the submitted sample  additional confirmatory testing may be necessary for epidemiological  and / or clinical management purposes  to differentiate between  SARS-CoV-2 and other Sarbecovirus currently known to infect humans.  If clinically indicated additional testing with an alternate test  methodology [phone]) is advised. The SARS-CoV-2 RNA is generally  detectable in upper and lower respiratory sp ecimens during the acute  phase of infection. The expected result is Negative. Fact Sheet for Patients:  StrictlyIdeas.no Fact Sheet for Healthcare Providers: BankingDealers.co.za This test is not yet approved or cleared by the [place] FDA and has been authorized for detection and/or diagnosis of SARS-CoV-2 by FDA under an Emergency Use Authorization (EUA).  This EUA will remain in effect (meaning this test can be used) for the duration of the COVID-19 declaration under Section 564(b)(1) of the Act, 21 U.S.C. section 360bbb-3(b)(1), unless the authorization is terminated or revoked sooner. Performed at [facility], [place] [address], [place], [place] [address]    Dg Chest  Portable 1 View  Result Date: 12/30/2018 CLINICAL DATA:  Week with shortness of breath. Hypertension. Acute kidney injury. EXAM: PORTABLE CHEST 1 VIEW COMPARISON:  12/15/2018. FINDINGS: Heart size upper limits normal. No consolidation or edema. Calcified tortuous aorta. No effusion or pneumothorax. Improved cardiac size from priors. IMPRESSION: No active disease. Electronically Signed   By: Staci Righter M.D.   On: 12/30/2018 21:29    Pending Labs Unresulted Labs (From admission, onward)    Start     Ordered   12/31/18 6834  Basic metabolic panel  Tomorrow morning,   R     12/30/18 2206   12/31/18 0500  CBC  Tomorrow morning,   R     12/30/18 2206   12/30/18 2115  Protein / creatinine ratio, urine  Once,   R     12/30/18 2114   12/30/18 2115  Sodium, urine, random  Once,   R     12/30/18 2114   12/30/18 2115  Creatinine, urine, random  Once,   R     12/30/18 2114   12/30/18 2115  Urinalysis, Routine w reflex microscopic  Once,   R     12/30/18 2114          Vitals/Pain Today's Vitals   12/30/18 2253 12/30/18 2254 12/30/18 2255 12/30/18 2256  BP:      Pulse: 67 67 67 67  Resp: 20 (!) 22 18 (!) 21  Temp:      TempSrc:      SpO2: 92% 92% 92% 92%  PainSc:        Isolation Precautions No active isolations  Medications Medications  0.9 %  sodium chloride infusion (has no administration in time range)  dapsone tablet 100 mg (has no administration in time range)  amLODipine (NORVASC) tablet 10 mg (has no administration in time range)  atorvastatin (LIPITOR) tablet 20 mg (has no administration in time range)  furosemide (LASIX) tablet 40 mg (has no administration in time range)  gemfibrozil (LOPID) tablet 600 mg (has no administration in time range)  hydrALAZINE (APRESOLINE) tablet 50 mg (has no administration in time range)  hydrochlorothiazide (MICROZIDE) capsule 12.5 mg (has no administration in time range)  metoprolol succinate (TOPROL-XL) 24 hr tablet 50 mg (has no  administration in time range)  calcitRIOL (ROCALTROL) capsule 0.25 mcg (has no administration in time range)  levothyroxine (SYNTHROID) tablet 50 mcg (has no administration in time range)  predniSONE (DELTASONE) tablet 60 mg (has no administration in time range)  magnesium oxide (MAG-OX) tablet 400 mg (has no administration in time range)  pantoprazole (PROTONIX) EC tablet 20 mg (has no administration in time range)  multivitamin with minerals tablet 1 tablet (has no administration in time range)  Gerhardt's butt cream 1 application (has no administration in time range)  acetaminophen (TYLENOL) tablet 650 mg (has no administration in time range)    Or  acetaminophen (TYLENOL) suppository 650 mg (has no administration in time range)  ondansetron (ZOFRAN) tablet 4 mg (has no administration in time range)    Or  ondansetron (ZOFRAN) injection 4 mg (has no administration in time range)  insulin aspart (novoLOG) injection 0-9 Units (has no administration in time range)    Mobility walks with device Moderate fall risk   Focused Assessments    R Recommendations: See Admitting Provider Note  Report given to:   Additional Notes:

## 2018-12-30 NOTE — ED Notes (Signed)
Attempted to call report

## 2018-12-30 NOTE — ED Notes (Signed)
Dr. Augustin Coupe From [facility], low hgb. Pt needs transfused.

## 2018-12-30 NOTE — ED Notes (Signed)
Son named Doug Covil phone number [phone]. He is power of attorney would like to be called with any updates.

## 2018-12-30 NOTE — ED Provider Notes (Signed)
[facility] EMERGENCY DEPARTMENT Provider Note   CSN: [id number] Arrival date & time: 12/30/18  1735    History   Chief Complaint Chief Complaint  Patient presents with  . Abnormal Lab    HPI Emily Cannon is a 85 y.o. female.     HPI  85 year old female presents with anemia and shortness of breath.  History is taken from patient and son at bedside.  Recently had blood transfusion.  Diagnosed with Wegener's.  The patient has had dyspnea and fatigue today.  Feels like prior anemia.  Hemoglobin at doctor's office was 6.5.  No chest pain.  She has had a rash that was told to be shingles and this has been on and off bleeding.  She is also noticed some blood in her stool but no melena.  Past Medical History:  Diagnosis Date  . Acquired absence of organ, genital organs   . Allergic rhinitis, cause unspecified   . Anxiety state, unspecified   . Disorder of bone and cartilage, unspecified   . Hypothyroid   . Irritable bowel syndrome   . Lumbago   . Other and unspecified hyperlipidemia   . Perforation of tympanic membrane, unspecified   . Unspecified essential hypertension     Patient Active Problem List   Diagnosis Date Noted  . Symptomatic anemia 12/15/2018  . AKI (acute kidney injury) ([place]) 12/15/2018  . Hypomagnesemia 12/15/2018  . Steroid-induced hyperglycemia 12/15/2018  . Microscopic polyangiitis ([place]) 11/13/2018  . Positive P-ANCA titer 10/14/2018  . Viral URI 05/12/2018  . Syncope 06/05/2016  . Unspecified venous (peripheral) insufficiency 11/23/2012  . Dermatitis 11/10/2012  . Hypothyroidism 05/22/2011  . TYMPANIC MEMBRANE PERFORATION, LEFT EAR 08/08/2007  . Osteopenia 07/27/2007  . Hyperlipidemia 05/11/2007  . Anxiety state 05/11/2007  . Essential hypertension 05/11/2007  . ALLERGIC RHINITIS 05/11/2007  . Irritable bowel syndrome 05/11/2007  . LOW BACK PAIN 05/11/2007  . HYSTERECTOMY, HX OF 05/11/2007    Past Surgical History:   Procedure Laterality Date  . ABDOMINAL HYSTERECTOMY    . cataract surgery  5/12 and 12/24/2012   both eyes  . TONSILLECTOMY       OB History   No obstetric history on file.      Home Medications    Prior to Admission medications   Medication Sig Start Date End Date Taking? Authorizing Provider  amLODipine (NORVASC) 10 MG tablet TAKE ONE TABLET BY MOUTH DAILY 10/12/18   Burchette, Alinda Sierras, MD  atorvastatin (LIPITOR) 20 MG tablet TAKE ONE TABLET BY MOUTH DAILY 10/12/18   Burchette, Alinda Sierras, MD  Blood Glucose Monitoring Suppl (ONETOUCH VERIO) w/Device KIT Use to test blood glucose once daily 12/24/18   Burchette, Alinda Sierras, MD  Cholecalciferol (VITAMIN D) 50 MCG (2000 UT) CAPS Take 2,000 Units by mouth daily.    [provider]  furosemide (LASIX) 40 MG tablet Take 0.5 tablets (20 mg total) by mouth 3 (three) times a week. Monday, Wed, Friday 12/18/18   Nita Sells, MD  gemfibrozil (LOPID) 600 MG tablet TAKE ONE TABLET BY MOUTH DAILY 10/12/18   Burchette, Alinda Sierras, MD  glucose blood (ONETOUCH VERIO) test strip Use as instructed to check blood glucose once daily 12/24/18   Burchette, Alinda Sierras, MD  hydrALAZINE (APRESOLINE) 50 MG tablet Take 1 tablet (50 mg total) by mouth 3 (three) times daily. 12/18/18   Nita Sells, MD  Hydrocortisone (GERHARDT'S BUTT CREAM) CREA Apply 1 application topically 3 (three) times daily. 12/18/18   Nita Sells,  MD  levothyroxine (SYNTHROID, LEVOTHROID) 50 MCG tablet TAKE ONE TABLET BY MOUTH EVERY MORNING BEFORE BREAKFAST 10/12/18   Burchette, Alinda Sierras, MD  metoprolol succinate (TOPROL-XL) 50 MG 24 hr tablet TAKE ONE TABLET BY MOUTH TWICE A DAY 09/17/18   Burchette, Alinda Sierras, MD  pantoprazole (PROTONIX) 20 MG tablet Take 1 tablet (20 mg total) by mouth daily. 12/19/18   Nita Sells, MD  potassium chloride SA (K-DUR) 20 MEQ tablet Take 2 tablets (40 mEq total) by mouth 2 (two) times daily. 12/19/18   Nita Sells, MD  predniSONE  (DELTASONE) 20 MG tablet Take 3 tablets (60 mg total) by mouth daily before breakfast. 12/19/18   Nita Sells, MD  sodium bicarbonate 650 MG tablet Take 650 mg by mouth 2 (two) times daily.    [provider]    Family History Family History  Problem Relation Age of Onset  . Lung cancer Mother 23  . Lung disease Father 61  . COPD Other        sibling  . Colon cancer Neg Hx     Social History Social History   Tobacco Use  . Smoking status: Never Smoker  . Smokeless tobacco: Never Used  Substance Use Topics  . Alcohol use: No  . Drug use: No     Allergies   Alendronate sodium; Amoxicillin; Codeine; and Valsartan   Review of Systems Review of Systems  Constitutional: Negative for fever.  Respiratory: Positive for shortness of breath. Negative for cough.   Cardiovascular: Negative for chest pain.  Gastrointestinal: Positive for blood in stool.  Skin: Positive for rash.  Neurological: Positive for weakness.  All other systems reviewed and are negative.    Physical Exam Updated Vital Signs BP 130/61   Pulse 70   Temp 97.9 F (36.6 C) (Oral)   Resp (!) 22   SpO2 91%   Physical Exam Vitals signs and nursing note reviewed.  Constitutional:      Appearance: She is well-developed.  HENT:     Head: Normocephalic and atraumatic.     Right Ear: External ear normal.     Left Ear: External ear normal.     Nose: Nose normal.  Eyes:     General:        Right eye: No discharge.        Left eye: No discharge.  Cardiovascular:     Rate and Rhythm: Normal rate and regular rhythm.     Heart sounds: Normal heart sounds.  Pulmonary:     Effort: Pulmonary effort is normal.     Breath sounds: Normal breath sounds.  Abdominal:     Palpations: Abdomen is soft.     Tenderness: There is no abdominal tenderness.  Genitourinary:    Comments: Brown stool, no gross blood External hemorrhoid Skin:    General: Skin is warm and dry.     Comments: Bilateral  buttock rash that is scabbed over  Neurological:     Mental Status: She is alert.  Psychiatric:        Mood and Affect: Mood is not anxious.      ED Treatments / Results  Labs (all labs ordered are listed, but only abnormal results are displayed) Labs Reviewed  CBC - Abnormal; Notable for the following components:      Result Value   RBC 2.04 (*)    Hemoglobin 6.6 (*)    HCT 21.8 (*)    MCV 106.9 (*)    RDW 21.2 (*)  nRBC 10.4 (*)    All other components within normal limits  BASIC METABOLIC PANEL - Abnormal; Notable for the following components:   Sodium 132 (*)    CO2 19 (*)    Glucose, Bld 232 (*)    BUN 53 (*)    Creatinine, Ser 2.77 (*)    Calcium 8.8 (*)    GFR calc non Af Amer 15 (*)    GFR calc Af Amer 18 (*)    All other components within normal limits  POC OCCULT BLOOD, ED  TYPE AND SCREEN    EKG None  Radiology Dg Chest Portable 1 View  Result Date: 12/30/2018 CLINICAL DATA:  Week with shortness of breath. Hypertension. Acute kidney injury. EXAM: PORTABLE CHEST 1 VIEW COMPARISON:  12/15/2018. FINDINGS: Heart size upper limits normal. No consolidation or edema. Calcified tortuous aorta. No effusion or pneumothorax. Improved cardiac size from priors. IMPRESSION: No active disease. Electronically Signed   By: Staci Righter M.D.   On: 12/30/2018 21:29    Procedures .Critical Care Performed by: Sherwood Gambler, MD Authorized by: Sherwood Gambler, MD   Critical care provider statement:    Critical care time (minutes):  30   Critical care time was exclusive of:  Separately billable procedures and treating other patients   Critical care was necessary to treat or prevent imminent or life-threatening deterioration of the following conditions:  Circulatory failure   Critical care was time spent personally by me on the following activities:  Discussions with consultants, evaluation of patient's response to treatment, examination of patient, ordering and performing  treatments and interventions, ordering and review of laboratory studies, ordering and review of radiographic studies, pulse oximetry, re-evaluation of patient's condition, obtaining history from patient or surrogate and review of old charts   (including critical care time)  Medications Ordered in ED Medications  0.9 %  sodium chloride infusion (has no administration in time range)  dapsone tablet 100 mg (has no administration in time range)  amLODipine (NORVASC) tablet 10 mg (has no administration in time range)  atorvastatin (LIPITOR) tablet 20 mg (has no administration in time range)  furosemide (LASIX) tablet 40 mg (has no administration in time range)  gemfibrozil (LOPID) tablet 600 mg (has no administration in time range)  hydrALAZINE (APRESOLINE) tablet 50 mg (has no administration in time range)  hydrochlorothiazide (MICROZIDE) capsule 12.5 mg (has no administration in time range)  metoprolol succinate (TOPROL-XL) 24 hr tablet 50 mg (has no administration in time range)  calcitRIOL (ROCALTROL) capsule 0.25 mcg (has no administration in time range)  levothyroxine (SYNTHROID) tablet 50 mcg (has no administration in time range)  predniSONE (DELTASONE) tablet 60 mg (has no administration in time range)  magnesium oxide (MAG-OX) tablet 400 mg (has no administration in time range)  pantoprazole (PROTONIX) EC tablet 20 mg (has no administration in time range)  multivitamin with minerals tablet 1 tablet (has no administration in time range)  Gerhardt's butt cream 1 application (has no administration in time range)  acetaminophen (TYLENOL) tablet 650 mg (has no administration in time range)    Or  acetaminophen (TYLENOL) suppository 650 mg (has no administration in time range)  ondansetron (ZOFRAN) tablet 4 mg (has no administration in time range)    Or  ondansetron (ZOFRAN) injection 4 mg (has no administration in time range)  insulin aspart (novoLOG) injection 0-9 Units (has no  administration in time range)     Initial Impression / Assessment and Plan / ED Course  I have  reviewed the triage vital signs and the nursing notes.  Pertinent labs & imaging results that were available during my care of the patient were reviewed by me and considered in my medical decision making (see chart for details).        Patient presents with recurrent anemia.  Unclear cause.  Unclear if this is related to medications or not.  She reports blood in her stools however so this may be a cause though she is Hemoccult and grossly negative now.  Given her current hemoglobin she will be transfused 1 unit.  Dr. Hal Hope to admit.  Final Clinical Impressions(s) / ED Diagnoses   Final diagnoses:  Symptomatic anemia    ED Discharge Orders    None       Sherwood Gambler, MD 12/30/18 2329

## 2018-12-30 NOTE — ED Notes (Signed)
Attempted to call report. Will do bedside report.

## 2018-12-30 NOTE — Telephone Encounter (Signed)
Called son Marden Noble and he gave me the verbal OK to fax home care order to [facility]. Order has been faxed.  Marden Noble is aware to have DPR updated for both him and Emily Cannon.  Doug asked if there was anything that he needed to help with for Emily Cannon and Emily Cannon from her visit today? Is there anything that Dr. Elease Hashimoto needs for him to do or be aware of?  Marden Noble is also concerned that Emily Cannon is back on Prednisone now that she is finished with the Cipro and he is concerned about giving his mom Glimepiride for her blood sugars since she became hypoglycemic in the mornings when taking this medication. Does she need this medication? Or if they monitor her blood sugars is there a level if consistent that she would need to take this medication? He feels there are better options and needs to be advised what would be best for his mom.  Please advise.

## 2018-12-30 NOTE — ED Notes (Signed)
Consent for blood completed

## 2018-12-30 NOTE — ED Notes (Signed)
Blood Started  Verified Programme researcher, broadcasting/film/video

## 2018-12-31 ENCOUNTER — Encounter (HOSPITAL_COMMUNITY): Payer: Self-pay | Admitting: Physician Assistant

## 2018-12-31 DIAGNOSIS — K625 Hemorrhage of anus and rectum: Principal | ICD-10-CM

## 2018-12-31 DIAGNOSIS — D631 Anemia in chronic kidney disease: Secondary | ICD-10-CM | POA: Diagnosis not present

## 2018-12-31 DIAGNOSIS — I10 Essential (primary) hypertension: Secondary | ICD-10-CM | POA: Diagnosis not present

## 2018-12-31 DIAGNOSIS — A084 Viral intestinal infection, unspecified: Secondary | ICD-10-CM | POA: Diagnosis not present

## 2018-12-31 DIAGNOSIS — F411 Generalized anxiety disorder: Secondary | ICD-10-CM | POA: Diagnosis present

## 2018-12-31 DIAGNOSIS — D649 Anemia, unspecified: Secondary | ICD-10-CM

## 2018-12-31 DIAGNOSIS — Z885 Allergy status to narcotic agent status: Secondary | ICD-10-CM | POA: Diagnosis not present

## 2018-12-31 DIAGNOSIS — E039 Hypothyroidism, unspecified: Secondary | ICD-10-CM | POA: Diagnosis not present

## 2018-12-31 DIAGNOSIS — N184 Chronic kidney disease, stage 4 (severe): Secondary | ICD-10-CM | POA: Diagnosis not present

## 2018-12-31 DIAGNOSIS — N179 Acute kidney failure, unspecified: Secondary | ICD-10-CM | POA: Diagnosis not present

## 2018-12-31 DIAGNOSIS — Z801 Family history of malignant neoplasm of trachea, bronchus and lung: Secondary | ICD-10-CM | POA: Diagnosis not present

## 2018-12-31 DIAGNOSIS — R195 Other fecal abnormalities: Secondary | ICD-10-CM | POA: Diagnosis not present

## 2018-12-31 DIAGNOSIS — D62 Acute posthemorrhagic anemia: Secondary | ICD-10-CM | POA: Diagnosis not present

## 2018-12-31 DIAGNOSIS — Z881 Allergy status to other antibiotic agents status: Secondary | ICD-10-CM | POA: Diagnosis not present

## 2018-12-31 DIAGNOSIS — K2970 Gastritis, unspecified, without bleeding: Secondary | ICD-10-CM | POA: Diagnosis not present

## 2018-12-31 DIAGNOSIS — I129 Hypertensive chronic kidney disease with stage 1 through stage 4 chronic kidney disease, or unspecified chronic kidney disease: Secondary | ICD-10-CM | POA: Diagnosis not present

## 2018-12-31 DIAGNOSIS — D125 Benign neoplasm of sigmoid colon: Secondary | ICD-10-CM | POA: Diagnosis not present

## 2018-12-31 DIAGNOSIS — Z1159 Encounter for screening for other viral diseases: Secondary | ICD-10-CM | POA: Diagnosis not present

## 2018-12-31 DIAGNOSIS — B029 Zoster without complications: Secondary | ICD-10-CM | POA: Diagnosis present

## 2018-12-31 DIAGNOSIS — K589 Irritable bowel syndrome without diarrhea: Secondary | ICD-10-CM | POA: Diagnosis present

## 2018-12-31 DIAGNOSIS — M317 Microscopic polyangiitis: Secondary | ICD-10-CM

## 2018-12-31 DIAGNOSIS — K635 Polyp of colon: Secondary | ICD-10-CM | POA: Diagnosis not present

## 2018-12-31 DIAGNOSIS — D126 Benign neoplasm of colon, unspecified: Secondary | ICD-10-CM | POA: Diagnosis not present

## 2018-12-31 DIAGNOSIS — B3781 Candidal esophagitis: Secondary | ICD-10-CM | POA: Diagnosis present

## 2018-12-31 DIAGNOSIS — L309 Dermatitis, unspecified: Secondary | ICD-10-CM | POA: Diagnosis present

## 2018-12-31 DIAGNOSIS — K259 Gastric ulcer, unspecified as acute or chronic, without hemorrhage or perforation: Secondary | ICD-10-CM | POA: Diagnosis not present

## 2018-12-31 DIAGNOSIS — Z7952 Long term (current) use of systemic steroids: Secondary | ICD-10-CM | POA: Diagnosis not present

## 2018-12-31 DIAGNOSIS — E1122 Type 2 diabetes mellitus with diabetic chronic kidney disease: Secondary | ICD-10-CM | POA: Diagnosis present

## 2018-12-31 DIAGNOSIS — K648 Other hemorrhoids: Secondary | ICD-10-CM | POA: Diagnosis present

## 2018-12-31 DIAGNOSIS — Z888 Allergy status to other drugs, medicaments and biological substances status: Secondary | ICD-10-CM | POA: Diagnosis not present

## 2018-12-31 DIAGNOSIS — E785 Hyperlipidemia, unspecified: Secondary | ICD-10-CM | POA: Diagnosis present

## 2018-12-31 LAB — BPAM RBC
Blood Product Expiration Date: 202006182359
ISSUE DATE / TIME: 202006032139
Unit Type and Rh: 6200

## 2018-12-31 LAB — GLUCOSE, CAPILLARY
Glucose-Capillary: 68 mg/dL — ABNORMAL LOW (ref 70–99)
Glucose-Capillary: 152 mg/dL — ABNORMAL HIGH (ref 70–99)
Glucose-Capillary: 108 mg/dL — ABNORMAL HIGH (ref 70–99)
Glucose-Capillary: 177 mg/dL — ABNORMAL HIGH (ref 70–99)
Glucose-Capillary: 304 mg/dL — ABNORMAL HIGH (ref 70–99)

## 2018-12-31 LAB — CBC
HCT: 22.6 % — ABNORMAL LOW (ref 36.0–46.0)
Hemoglobin: 7.6 g/dL — ABNORMAL LOW (ref 12.0–15.0)
MCH: 31.9 pg (ref 26.0–34.0)
MCHC: 33.6 g/dL (ref 30.0–36.0)
MCV: 95 fL (ref 80.0–100.0)
Platelets: 155 10*3/uL (ref 150–400)
RBC: 2.38 MIL/uL — ABNORMAL LOW (ref 3.87–5.11)
RDW: 20.3 % — ABNORMAL HIGH (ref 11.5–15.5)
WBC: 6.8 10*3/uL (ref 4.0–10.5)
nRBC: 6.8 % — ABNORMAL HIGH (ref 0.0–0.2)

## 2018-12-31 LAB — BASIC METABOLIC PANEL
Anion gap: 9 (ref 5–15)
BUN: 49 mg/dL — ABNORMAL HIGH (ref 8–23)
CO2: 22 mmol/L (ref 22–32)
Calcium: 8.6 mg/dL — ABNORMAL LOW (ref 8.9–10.3)
Chloride: 105 mmol/L (ref 98–111)
Creatinine, Ser: 2.61 mg/dL — ABNORMAL HIGH (ref 0.44–1.00)
GFR calc Af Amer: 19 mL/min — ABNORMAL LOW (ref >60)
GFR calc non Af Amer: 16 mL/min — ABNORMAL LOW (ref >60)
Glucose, Bld: 78 mg/dL (ref 70–99)
Potassium: 3.7 mmol/L (ref 3.5–5.1)
Sodium: 136 mmol/L (ref 135–145)

## 2018-12-31 LAB — TYPE AND SCREEN
ABO/RH(D): A POS
Antibody Screen: NEGATIVE
Unit division: 0

## 2018-12-31 MED ORDER — SODIUM CHLORIDE 0.9 % IV SOLN
INTRAVENOUS | Status: DC | PRN
  Administered 2018-12-31: 16:00:00 via INTRAVENOUS

## 2018-12-31 MED ORDER — VALACYCLOVIR HCL 500 MG PO TABS
1000.0000 mg | ORAL_TABLET | Freq: Every day | ORAL | Status: DC
  Administered 2018-12-31 – 2019-01-03 (×4): 1000 mg via ORAL
  Filled 2018-12-31 (×4): qty 2

## 2018-12-31 MED ORDER — FUROSEMIDE 40 MG PO TABS
40.0000 mg | ORAL_TABLET | Freq: Every day | ORAL | Status: DC
  Administered 2018-12-31 – 2019-01-03 (×4): 40 mg via ORAL
  Filled 2018-12-31 (×4): qty 1

## 2018-12-31 MED ORDER — SODIUM CHLORIDE 0.9 % IV SOLN
510.0000 mg | INTRAVENOUS | Status: DC
  Administered 2018-12-31: 510 mg via INTRAVENOUS
  Filled 2018-12-31 (×2): qty 17

## 2018-12-31 MED ORDER — PEG-KCL-NACL-NASULF-NA ASC-C 100 G PO SOLR
1.0000 | Freq: Once | ORAL | Status: DC
  Filled 2018-12-31: qty 1

## 2018-12-31 MED ORDER — DARBEPOETIN ALFA 100 MCG/0.5ML IJ SOSY
100.0000 ug | PREFILLED_SYRINGE | INTRAMUSCULAR | Status: DC
  Filled 2018-12-31 (×2): qty 0.5

## 2018-12-31 NOTE — Consult Note (Signed)
[facility] Consult: 11:31 AM 12/31/2018  LOS: 0 days    Referring Provider: Dr Kalman Jewels  Primary Care Physician:  Eulas Post, MD Primary Gastroenterologist:  Dr. Delfin Edis.  However pt claims it is/was Dr Henrene Pastor but he has never seen the pt.      Reason for Consultation:  Anemia.   FOBT +    HPI: Emily Cannon is a 85 y.o. female.  PMH Htn.  Dyslipidemia.  Hypothyroidism.  NIDDM.   03/2013 colonoscopy.  Avg risk screening study.  Mild sigmoid diverticulosis, no plan for recall colonoscopy due to patient age  12/2018 Citrobacter UTI.  Treated with Omnicef >> Levaquin.  Biopsy of LE petechial rash 10/07/2018: "favor an urticarial allergic reaction with hemorrhage, but suspicious that in this clinical setting may have a vasculitis not identified in this biopsy".  10/22/2018 chest CT: groundglass opacities bilateral lungs C/W alveolar hemorrhage.  Mild bil lower lobe bronchiectasis.  LLL nodules. 3V coronary atherosclerosis.  Dxd Microscopic polyangiitis (MPA) 10/2018, treated with Rituxan, Dapsone, high dose Prednisone.    12/15/2018 -12/18/2018 hospital admission with symptomatic anemia, AKI, FTT. Hgb 5.5 (from 9.2 on 10/07/18), transfused 2 PRBCs to 10 >> 8.8 >> 9.2 over next 4 days.  Dosed with Aranesp.  She was heme negative. ? bone marrow suppression from Rituxan was the source of the anemia.  She completed planned course of the Rituxan. Prednisone 60 mg/day resumed. Dapsone briefly discontinued, then resumed.     Sent from MD office to ED yest with Hgb AM 6.6 >> 2 PRBCs >> 7.6.  C/o fatigue, dyspnea similar to sxs before recent admission.  Having bleeding from perianal rash, shingles.  Sees blood drip into commode with and without BM.    May see blood with wiping after BM.  Stool are brown, never bloody  or melenic.  Good appetite, no abd pain, no nausea.  No other unusual bleeding or bruising.   MCV disease variable from 89 to 106. Platelets and WBCs not deranged Not iron, B12, folate deficient as of 12/15/2018 FOBT negative on 12/15/18, positive on 12/30/2018.    UA with multiple bacteria, WBCs, RBCs.  Not grossly bloody. Valcyclovir IV just started today.    Past Medical History:  Diagnosis Date  . Acquired absence of organ, genital organs   . Allergic rhinitis, cause unspecified   . Anxiety state, unspecified   . Disorder of bone and cartilage, unspecified   . Hypothyroid   . Irritable bowel syndrome   . Lumbago   . Other and unspecified hyperlipidemia   . Perforation of tympanic membrane, unspecified   . Unspecified essential hypertension     Past Surgical History:  Procedure Laterality Date  . ABDOMINAL HYSTERECTOMY    . cataract surgery  5/12 and 12/24/2012   both eyes  . TONSILLECTOMY      Prior to Admission medications   Medication Sig Start Date End Date Taking? Authorizing Provider  amLODipine (NORVASC) 10 MG tablet TAKE ONE TABLET BY MOUTH DAILY Patient taking differently: Take 10 mg by mouth daily.  10/12/18  Yes Burchette, Alinda Sierras, MD  atorvastatin (LIPITOR) 20 MG tablet TAKE ONE TABLET BY MOUTH DAILY Patient taking differently: Take 20 mg by mouth daily.  10/12/18  Yes Burchette, Alinda Sierras, MD  calcitRIOL (ROCALTROL) 0.25 MCG capsule Take 0.25 mcg by mouth every Monday, Wednesday, and Friday.  12/28/18  Yes [provider]  Cholecalciferol (VITAMIN D-3 PO) Take 1 capsule by mouth every Monday, Wednesday, and Friday.   Yes [provider]  dapsone 100 MG tablet Take 100 mg by mouth every morning.  12/22/18  Yes [provider]  furosemide (LASIX) 40 MG tablet Take 0.5 tablets (20 mg total) by mouth 3 (three) times a week. Monday, Wed, Friday Patient taking differently: Take 40 mg by mouth every Monday, Wednesday, and Friday.  12/18/18  Yes Nita Sells, MD  gemfibrozil (LOPID) 600 MG tablet TAKE ONE TABLET BY MOUTH DAILY Patient taking differently: Take 600 mg by mouth every morning.  10/12/18  Yes Burchette, Alinda Sierras, MD  glipiZIDE (GLUCOTROL) 5 MG tablet Take 2.5 mg by mouth daily before breakfast. 12/22/18  Yes [provider]  hydrALAZINE (APRESOLINE) 50 MG tablet Take 1 tablet (50 mg total) by mouth 3 (three) times daily. 12/18/18  Yes Nita Sells, MD  hydrochlorothiazide (MICROZIDE) 12.5 MG capsule Take 12.5 mg by mouth daily. 10/12/18  Yes [provider]  Hydrocortisone (GERHARDT'S BUTT CREAM) CREA Apply 1 application topically 3 (three) times daily. Patient taking differently: Apply 1 application topically 3 (three) times daily. TO AFFECTED SITES 12/18/18  Yes Nita Sells, MD  levothyroxine (SYNTHROID, LEVOTHROID) 50 MCG tablet TAKE ONE TABLET BY MOUTH EVERY MORNING BEFORE BREAKFAST Patient taking differently: Take 50 mcg by mouth daily before breakfast.  10/12/18  Yes Burchette, Alinda Sierras, MD  magnesium oxide (MAG-OX) 400 MG tablet Take 400 mg by mouth daily with breakfast.   Yes [provider]  metoprolol succinate (TOPROL-XL) 50 MG 24 hr tablet TAKE ONE TABLET BY MOUTH TWICE A DAY Patient taking differently: Take 50 mg by mouth 2 (two) times a day.  09/17/18  Yes Burchette, Alinda Sierras, MD  Multiple Vitamins-Minerals (CENTRUM SILVER 50+WOMEN) TABS Take 1 tablet by mouth daily with breakfast.   Yes [provider]  pantoprazole (PROTONIX) 20 MG tablet Take 1 tablet (20 mg total) by mouth daily. 12/19/18  Yes Nita Sells, MD  predniSONE (DELTASONE) 20 MG tablet Take 3 tablets (60 mg total) by mouth daily before breakfast. 12/19/18  Yes Nita Sells, MD  Blood Glucose Monitoring Suppl (ONETOUCH VERIO) w/Device KIT Use to test blood glucose once daily 12/24/18   Burchette, Alinda Sierras, MD  glucose blood (ONETOUCH VERIO) test strip Use as instructed to check blood glucose once  daily 12/24/18   Burchette, Alinda Sierras, MD  potassium chloride SA (K-DUR) 20 MEQ tablet Take 2 tablets (40 mEq total) by mouth 2 (two) times daily. Patient not taking: Reported on 12/30/2018 12/19/18   Nita Sells, MD    Scheduled Meds: . amLODipine  10 mg Oral Daily  . atorvastatin  20 mg Oral Daily  . [START ON 01/01/2019] calcitRIOL  0.25 mcg Oral Q M,W,F  . dapsone  100 mg Oral q morning - 10a  . darbepoetin (ARANESP) injection - NON-DIALYSIS  100 mcg Subcutaneous Q Thu-1800  . [START ON 01/01/2019] furosemide  40 mg Oral Q M,W,F  . gemfibrozil  600 mg Oral q morning - 10a  . Gerhardt's butt cream  1 application Topical TID  . hydrALAZINE  50  mg Oral TID  . hydrochlorothiazide  12.5 mg Oral Daily  . insulin aspart  0-9 Units Subcutaneous TID WC  . levothyroxine  50 mcg Oral QAC breakfast  . magnesium oxide  400 mg Oral Q breakfast  . metoprolol succinate  50 mg Oral BID  . multivitamin with minerals  1 tablet Oral Q breakfast  . pantoprazole  20 mg Oral Daily  . predniSONE  60 mg Oral QAC breakfast  . valACYclovir  1,000 mg Oral Daily   Infusions: . ferumoxytol     PRN Meds: acetaminophen **OR** acetaminophen, ondansetron **OR** ondansetron (ZOFRAN) IV   Allergies as of 12/30/2018 - Review Complete 12/30/2018  Allergen Reaction Noted  . Alendronate sodium Shortness Of Breath   . Amoxicillin Diarrhea   . Codeine Nausea Only   . Valsartan Swelling     Family History  Problem Relation Age of Onset  . Lung cancer Mother 29  . Lung disease Father 66  . COPD Other        sibling  . Colon cancer Neg Hx     Social History   Socioeconomic History  . Marital status: Married    Spouse name: Jeneen Rinks  . Number of children: 2  . Years of education: Not on file  . Highest education level: Not on file  Occupational History  . Occupation: retired Health visitor  . Financial resource strain: Not on file  . Food insecurity:    Worry: Not on file    Inability:  Not on file  . Transportation needs:    Medical: Not on file    Non-medical: Not on file  Tobacco Use  . Smoking status: Never Smoker  . Smokeless tobacco: Never Used  Substance and Sexual Activity  . Alcohol use: No  . Drug use: No  . Sexual activity: Not on file  Lifestyle  . Physical activity:    Days per week: Not on file    Minutes per session: Not on file  . Stress: Not on file  Relationships  . Social connections:    Talks on phone: Not on file    Gets together: Not on file    Attends religious service: Not on file    Active member of club or organization: Not on file    Attends meetings of clubs or organizations: Not on file    Relationship status: Not on file  . Intimate partner violence:    Fear of current or ex partner: Not on file    Emotionally abused: Not on file    Physically abused: Not on file    Forced sexual activity: Not on file  Other Topics Concern  . Not on file  Social History Narrative   Son is Doug Coble--pt here     REVIEW OF SYSTEMS: Constitutional: Feels tired, weak.  Weakness is not profound. ENT:  No nose bleeds Pulm: Mild DOE.  No cough. CV:  No palpitations, no CP.  Call/pedal edema for last few months. GU:  No hematuria, no frequency. GI: Per HPI. Heme: No unusual bleeding or bruising.  No previous need for transfusions (before 11/2018 ) of blood, iron/B12/folate supplements. Transfusions: Per HPI Neuro:  No headaches, no peripheral tingling or numbness.  No dizziness, syncope, seizures. Derm: Lower extremity rash resolved.  Extensive, mildly uncomfortable rash in the perineum/buttocks bilaterally. Endocrine:  No sweats or chills.  No polyuria or dysuria Immunization: Reviewed.  Patient received zoster vaccination in 2012 and recombinant zoster (Shingrix) x2 doses  in 12/2017 Travel:  None     PHYSICAL EXAM: Vital signs in last 24 hours: Vitals:   12/31/18 0149 12/31/18 0439  BP: (!) 135/50 (!) 139/58  Pulse: 75 74  Resp: 16 16   Temp: 97.6 F (36.4 C) (!) 97.5 F (36.4 C)  SpO2: 92% 93%   Wt Readings from Last 3 Encounters:  12/31/18 57 kg  12/18/18 60.9 kg  11/13/18 57.7 kg    General: Does not, somewhat chronically ill looking WF.  Looks younger than age 22. Head: No facial asymmetry or signs of head trauma.  Slightly cushingoid facies Eyes: No conjunctival pallor.  No scleral icterus.  EOMI. Ears: Good hearing Nose: Discharge or congestion. Mouth: Oropharynx moist, clear, pink.  Good dentition.  Tongue midline. Neck: No mass, thyromegaly, JVD, bruits. Lungs: No labored breathing, no cough.  Good breath sounds, clear bilaterally. Heart: RRR.  No MRG.  S1, S2 present. Abdomen: Soft.  Not tender or distended.  No HSM, masses, bruits, hernias.  Bowel sounds active..   Rectal: No digital exam performed but visually excess there is extensive scaling, erythematous patchy rash in the perineal area extending into the medial buttocks bilaterally. Musc/Skeltl: No fracture deformities or joint redness. Extremities: Slight, 1+, bilateral pedal/ankle edema.  Slightly worse on the left. Neurologic: Fully alert.  Oriented x3.  No tremors, no gross weakness.  Moves all 4 limbs but limb strength not tested. Skin: Perineal rash as per rectal exam. Tattoos: None observed Nodes: No cervical adenopathy. Psych: Pleasant, cooperative, good historian.  Not anxious or depressed.  Intake/Output from previous day: 06/03 0701 - 06/04 0700 In: 0 [I.V.:0] Out: -  Intake/Output this shift: Total I/O In: 720 [P.O.:720] Out: -   LAB RESULTS: Recent Labs    12/30/18 1845 12/31/18 0405  WBC 6.2 6.8  HGB 6.6* 7.6*  HCT 21.8* 22.6*  PLT 186 155   BMET Lab Results  Component Value Date   NA 136 12/31/2018   NA 132 (L) 12/30/2018   NA 135 12/19/2018   K 3.7 12/31/2018   K 4.2 12/30/2018   K 2.9 (L) 12/19/2018   CL 105 12/31/2018   CL 100 12/30/2018   CL 104 12/19/2018   CO2 22 12/31/2018   CO2 19 (L) 12/30/2018    CO2 19 (L) 12/19/2018   GLUCOSE 78 12/31/2018   GLUCOSE 232 (H) 12/30/2018   GLUCOSE 90 12/19/2018   BUN 49 (H) 12/31/2018   BUN 53 (H) 12/30/2018   BUN 59 (H) 12/19/2018   CREATININE 2.61 (H) 12/31/2018   CREATININE 2.77 (H) 12/30/2018   CREATININE 2.27 (H) 12/19/2018   CALCIUM 8.6 (L) 12/31/2018   CALCIUM 8.8 (L) 12/30/2018   CALCIUM 8.4 (L) 12/19/2018   LFT No results for input(s): PROT, ALBUMIN, AST, ALT, ALKPHOS, BILITOT, BILIDIR, IBILI in the last 72 hours. PT/INR Lab Results  Component Value Date   INR 1.0 10/01/2018   INR 1.1 (H) 09/11/2018   INR 1.0 09/09/2018   Hepatitis Panel No results for input(s): HEPBSAG, HCVAB, HEPAIGM, HEPBIGM in the last 72 hours. C-Diff No components found for: CDIFF Lipase  No results found for: LIPASE  Drugs of Abuse  No results found for: LABOPIA, COCAINSCRNUR, LABBENZ, AMPHETMU, THCU, LABBARB   RADIOLOGY STUDIES: Dg Chest Portable 1 View  Result Date: 12/30/2018 CLINICAL DATA:  Week with shortness of breath. Hypertension. Acute kidney injury. EXAM: PORTABLE CHEST 1 VIEW COMPARISON:  12/15/2018. FINDINGS: Heart size upper limits normal. No consolidation or edema. Calcified tortuous  aorta. No effusion or pneumothorax. Improved cardiac size from priors. IMPRESSION: No active disease. Electronically Signed   By: Staci Righter M.D.   On: 12/30/2018 21:29     IMPRESSION:   *   FOBT + anemia.  Pt reports minor rectal bleeding which may all be coming from the extensive rash (zoster?) in the perineum/buttocks.  Regardless of the source of the bleeding, the volume described is nowhere near sufficient to explain the extent of her anemia.  She had no B12, folate or iron deficiency when tested 2 weeks ago.   Renal MD has ordered Feraheme and repeated dose Aranesp.    *    AKI, baselilne CKD 4.  + ANCA vasculitis.    *    Microscopic polyangiitis (MPA).  On 60 mg Prednisone daily, Dapsone.  Completed Rituximab.    *    Herpes zoster in  perineum.     PLAN:     *   ? Endoscopic workup?   Dr Havery Moros will see pt later today.     Azucena Freed  12/31/2018, 11:31 AM Phone [phone]

## 2018-12-31 NOTE — Telephone Encounter (Signed)
Spoke with son and answered his questions. °

## 2018-12-31 NOTE — Care Management Obs Status (Signed)
[facility] NOTIFICATION   Patient Details  Name: Emily Cannon MRN: [id number] Date of Birth: 10/28/1932   Medicare Observation Status Notification Given:       Zenon Mayo, RN 12/31/2018, 3:40 PM

## 2018-12-31 NOTE — Progress Notes (Signed)
.   Pt was  Admitted to [facility]. Pt was Oriented to unit ,  how to call for assistance and fall  education completed . Pt verbalized understanding of risks associated with falls.  Pt with partial thickness wounds  from ruptured blisters on buttocks and rectum/ perineal area. Gerharts Butt cream applied per order . To continue to monitor and treat pt per MD and nursing orders

## 2018-12-31 NOTE — TOC Initial Note (Signed)
Transition of Care [facility]) - Initial/Assessment Note    Patient Details  Name: Emily Cannon MRN: [id number] Date of Birth: 1932-12-08  [facility] CM/SW Contact:    Zenon Mayo, RN Phone Number: 12/31/2018, 3:44 PM  Clinical Narrative:                 From home with spouse, ABLA, she states she has PCP and she has no barriers getting her medications and she has transportation at dc.   Expected Discharge Plan: Home/Self Care Barriers to Discharge: No Barriers Identified   Patient Goals and CMS Choice Patient states their goals for this hospitalization and ongoing recovery are:: to be able to walk and be active like before   Choice offered to / list presented to : NA  Expected Discharge Plan and Services Expected Discharge Plan: Home/Self Care In-house Referral: NA Discharge Planning Services: CM Consult Post Acute Care Choice: NA Living arrangements for the past 2 months: Single Family Home                 DME Arranged: N/A         HH Arranged: NA          Prior Living Arrangements/Services Living arrangements for the past 2 months: Single Family Home Lives with:: Spouse Patient language and need for interpreter reviewed:: Yes Do you feel safe going back to the place where you live?: Yes      Need for Family Participation in Patient Care: No (Comment) Care giver support system in place?: No (comment)   Criminal Activity/Legal Involvement Pertinent to Current Situation/Hospitalization: No - Comment as needed  Activities of Daily Living Home Assistive Devices/Equipment: None ADL Screening (condition at time of admission) Patient's cognitive ability adequate to safely complete daily activities?: Yes Is the patient deaf or have difficulty hearing?: Yes Does the patient have difficulty seeing, even when wearing glasses/contacts?: No Does the patient have difficulty concentrating, remembering, or making decisions?: No Patient able to express  need for assistance with ADLs?: Yes Does the patient have difficulty dressing or bathing?: No Independently performs ADLs?: Yes (appropriate for developmental age) Does the patient have difficulty walking or climbing stairs?: No Weakness of Legs: None Weakness of Arms/Hands: None  Permission Sought/Granted                  Emotional Assessment Appearance:: Appears stated age Attitude/Demeanor/Rapport: Gracious Affect (typically observed): Accepting Orientation: : Oriented to Self, Oriented to Place, Oriented to Situation, Oriented to  Time Alcohol / Substance Use: Not Applicable Psych Involvement: No (comment)  Admission diagnosis:  Symptomatic anemia [D64.9] Acute blood loss anemia [D62] Patient Active Problem List   Diagnosis Date Noted  . Acute blood loss anemia 12/30/2018  . Symptomatic anemia 12/15/2018  . AKI (acute kidney injury) ([place]) 12/15/2018  . Hypomagnesemia 12/15/2018  . Steroid-induced hyperglycemia 12/15/2018  . Microscopic polyangiitis ([place]) 11/13/2018  . Positive P-ANCA titer 10/14/2018  . Viral URI 05/12/2018  . Syncope 06/05/2016  . Unspecified venous (peripheral) insufficiency 11/23/2012  . Dermatitis 11/10/2012  . Hypothyroidism 05/22/2011  . TYMPANIC MEMBRANE PERFORATION, LEFT EAR 08/08/2007  . Osteopenia 07/27/2007  . Hyperlipidemia 05/11/2007  . Anxiety state 05/11/2007  . Essential hypertension 05/11/2007  . ALLERGIC RHINITIS 05/11/2007  . Irritable bowel syndrome 05/11/2007  . LOW BACK PAIN 05/11/2007  . HYSTERECTOMY, HX OF 05/11/2007   PCP:  Eulas Post, MD Pharmacy:   [facility] [address]  [place] [address] Phone: [phone] Fax: [phone]     Social Determinants of Health (SDOH) Interventions    Readmission Risk Interventions No flowsheet data found.

## 2018-12-31 NOTE — Consult Note (Addendum)
[facility] Nurse wound consult note Reason for Consult: Pt is familiar to [facility] team from recent admission; refer to consult note on 5/22.  She had "Partial thickness wounds, ruptured serum filled blisters on buttocks and near rectum/perineal area. Hydrocortisone/lotrimin and zinc oxide treatment can be applied three times daily."  Pt is noted to have herpes zoster and is on systemic coverage for this medical condition.  Suspect the previous partial thickness wounds declined, blisters have ruptured and spread in location with the herpes zoster, and patient states she is frequently incontinent of urine and wears briefs when at home. This trapped heat and moisture and exacerbated the problem.  Wound type: There are multiple red partial thickness lesions scattered across bilat buttocks and to inner gluteal fold, and upper thighs and near rectum. Some patchy areas are beginning to dry and scab, others are red and moist and weeping small amt yellow drainage, no odor.  All are painful to touch, especially near rectum, pt states.  The previously applied foam dressings are soiled and have absorbed urine, even though the patient has a pure wick in place to attempt to contain urine.  These are NOT pressure injuries Dressing procedure/placement/frequency: It is best practice to leave herpes zoster lesions open to air; barrier cream to protect and repel moisture.  Air mattress ordered to promote increased airflow and drying and healing.  Please re-consult if further assistance is needed.  Thank-you,  Julien Girt MSN, [facility], [place], [place], [place]

## 2018-12-31 NOTE — Progress Notes (Signed)
PROGRESS NOTE    Emily Cannon  BJS:[id number] DOB: 1933/07/28 DOA: 12/30/2018 PCP: Eulas Post, MD    Brief Narrative:  85 y.o. female with history of chronic kidney disease with microscopic polyangiitis on prednisone, diabetes mellitus, hypertension was recently admitted for symptomatic anemia and at the time of discharge hemoglobin was around 9.2 on Dec 19, 2018 that is around 10 days ago had routine labs done by patient's nephrologist and was found to hemoglobin around 6 and was advised to come to the ER.  Patient has been recently diagnosed with herpes zoster of the buttock area and has been on valacyclovir for 2 weeks by patient's dermatologist.  Her lesions have healed but has had been some skin cracking around the anal area from the previous lesions and hematologist has advised to continue the valacyclovir for 1 more week.  Otherwise denies any chest pain or shortness of breath nausea vomiting or abdominal pain.  ED Course: In the ER patient's creatinine is increased from 2.210 days ago to 2.7.  Hemoglobin 6.6 platelets 186 UA is pending.  On exam patient has bilateral lower extremity edema.  Per patient son was at the bedside patient's Lasix dose was recently increased by nephrologist.  Nephrologist on-call has been consulted patient admitted for acute blood loss anemia.  Stool for occult blood was positive.  Assessment & Plan:   Principal Problem:   Acute blood loss anemia Active Problems:   Essential hypertension   Hypothyroidism   Microscopic polyangiitis (HCC)   AKI (acute kidney injury) ([place])   1. Acute blood loss anemia 1. Required 1 unit of PRBC transfusion overnight 2. Stools are heme pos 3. GI consulted, appreciate input. ?endoscopic eval?  4. Chart reviewed, last colon noted in 2014 with findings of diverticulosis, otherwise unremarkable 2. Acute on chronic disease with microscopic polyangiitis on prednisone at this time as received rituximab previously.    1. Nephrology consulted and following 2. Repeat bmet in AM 3. Hypothyroidism 1. Continue synthroid as tolerated 4. Hypertension 1. Continued on amlodipine metoprolol hydralazine hydrochlorothiazide. 2. BP stable at present 5. Hyperlipidemia on statins and gemfibrozil. 1. Stable at this time 6. Recently treated herpes zoster of the buttock area.   1. Patient recently saw dermatology advised to continue with valacyclovir for 1 more week.  Has 6 more days to go.  Continue valacyclovir and recommended 1 g daily for next 6 days. 2. Rash noted to be in multiple stages of healing throughout multiple dermatomes across bilateral sacral, gluteal regions. Will thus require airborne precautions  DVT prophylaxis: SCD's Code Status: Full Family Communication: Pt in room, family not at bedside Disposition Plan: Uncertain at this time  Consultants:   GI  Nephrology  Procedures:     Antimicrobials: Anti-infectives (From admission, onward)   Start     Dose/Rate Route Frequency Ordered Stop   12/31/18 1000  dapsone tablet 100 mg     100 mg Oral  Every morning - 10a 12/30/18 2206     12/31/18 1000  valACYclovir (VALTREX) tablet 1,000 mg     1,000 mg Oral Daily 12/31/18 0105 01/06/19 0959       Subjective: Denies complaints at this time  Objective: Vitals:   12/30/18 2300 12/31/18 0149 12/31/18 0439 12/31/18 1441  BP:  (!) 135/50 (!) 139/58 (!) 131/52  Pulse: 74 75 74 66  Resp: (!) 21 16 16    Temp: 98.2 F (36.8 C) 97.6 F (36.4 C) (!) 97.5 F (36.4 C) (!) 97.5 F (  36.4 C)  TempSrc: Oral Oral Oral Oral  SpO2: 92% 92% 93% (!) 88%  Weight:  57 kg    Height:  5' (1.524 m)      Intake/Output Summary (Last 24 hours) at 12/31/2018 1513 Last data filed at 12/31/2018 0900 Gross per 24 hour  Intake 720.02 ml  Output -  Net 720.02 ml   Filed Weights   12/31/18 0149  Weight: 57 kg    Examination:  General exam: Appears calm and comfortable  Respiratory system: Clear to  auscultation. Respiratory effort normal. Cardiovascular system: S1 & S2 heard, RRR Gastrointestinal system: Abdomen is nondistended, soft and nontender. No organomegaly or masses felt. Normal bowel sounds heard. Central nervous system: Alert and oriented. No focal neurological deficits. Extremities: Symmetric 5 x 5 power. Skin: extensive rash in multiple stages of healing over bilateral sacral and gluteal regions Psychiatry: Judgement and insight appear normal. Mood & affect appropriate.   Data Reviewed: I have personally reviewed following labs and imaging studies  CBC: Recent Labs  Lab 12/30/18 1845 12/31/18 0405  WBC 6.2 6.8  HGB 6.6* 7.6*  HCT 21.8* 22.6*  MCV 106.9* 95.0  PLT 186 277   Basic Metabolic Panel: Recent Labs  Lab 12/30/18 1845 12/31/18 0405  NA 132* 136  K 4.2 3.7  CL 100 105  CO2 19* 22  GLUCOSE 232* 78  BUN 53* 49*  CREATININE 2.77* 2.61*  CALCIUM 8.8* 8.6*   GFR: Estimated Creatinine Clearance: 12.9 mL/min (A) (by C-G formula based on SCr of 2.61 mg/dL (H)). Liver Function Tests: No results for input(s): AST, ALT, ALKPHOS, BILITOT, PROT, ALBUMIN in the last 168 hours. No results for input(s): LIPASE, AMYLASE in the last 168 hours. No results for input(s): AMMONIA in the last 168 hours. Coagulation Profile: No results for input(s): INR, PROTIME in the last 168 hours. Cardiac Enzymes: No results for input(s): CKTOTAL, CKMB, CKMBINDEX, TROPONINI in the last 168 hours. BNP (last 3 results) No results for input(s): PROBNP in the last 8760 hours. HbA1C: No results for input(s): HGBA1C in the last 72 hours. CBG: Recent Labs  Lab 12/31/18 0805 12/31/18 1210  GLUCAP 68* 152*   Lipid Profile: No results for input(s): CHOL, HDL, LDLCALC, TRIG, CHOLHDL, LDLDIRECT in the last 72 hours. Thyroid Function Tests: No results for input(s): TSH, T4TOTAL, FREET4, T3FREE, THYROIDAB in the last 72 hours. Anemia Panel: No results for input(s): VITAMINB12,  FOLATE, FERRITIN, TIBC, IRON, RETICCTPCT in the last 72 hours. Sepsis Labs: No results for input(s): PROCALCITON, LATICACIDVEN in the last 168 hours.  Recent Results (from the past 240 hour(s))  SARS Coronavirus 2 (CEPHEID - Performed in [place] hospital lab), Hosp Order     Status: None   Collection Time: 12/30/18  9:25 PM  Result Value Ref Range Status   SARS Coronavirus 2 NEGATIVE NEGATIVE Final    Comment: (NOTE) If result is NEGATIVE SARS-CoV-2 target nucleic acids are NOT DETECTED. The SARS-CoV-2 RNA is generally detectable in upper and lower  respiratory specimens during the acute phase of infection. The lowest  concentration of SARS-CoV-2 viral copies this assay can detect is 250  copies / mL. A negative result does not preclude SARS-CoV-2 infection  and should not be used as the sole basis for treatment or other  patient management decisions.  A negative result may occur with  improper specimen collection / handling, submission of specimen other  than nasopharyngeal swab, presence of viral mutation(s) within the  areas targeted by this assay,  and inadequate number of viral copies  (<250 copies / mL). A negative result must be combined with clinical  observations, patient history, and epidemiological information. If result is POSITIVE SARS-CoV-2 target nucleic acids are DETECTED. The SARS-CoV-2 RNA is generally detectable in upper and lower  respiratory specimens dur ing the acute phase of infection.  Positive  results are indicative of active infection with SARS-CoV-2.  Clinical  correlation with patient history and other diagnostic information is  necessary to determine patient infection status.  Positive results do  not rule out bacterial infection or co-infection with other viruses. If result is PRESUMPTIVE POSTIVE SARS-CoV-2 nucleic acids MAY BE PRESENT.   A presumptive positive result was obtained on the submitted specimen  and confirmed on repeat testing.  While  2019 novel coronavirus  (SARS-CoV-2) nucleic acids may be present in the submitted sample  additional confirmatory testing may be necessary for epidemiological  and / or clinical management purposes  to differentiate between  SARS-CoV-2 and other Sarbecovirus currently known to infect humans.  If clinically indicated additional testing with an alternate test  methodology [phone]) is advised. The SARS-CoV-2 RNA is generally  detectable in upper and lower respiratory sp ecimens during the acute  phase of infection. The expected result is Negative. Fact Sheet for Patients:  StrictlyIdeas.no Fact Sheet for Healthcare Providers: BankingDealers.co.za This test is not yet approved or cleared by the [place] FDA and has been authorized for detection and/or diagnosis of SARS-CoV-2 by FDA under an Emergency Use Authorization (EUA).  This EUA will remain in effect (meaning this test can be used) for the duration of the COVID-19 declaration under Section 564(b)(1) of the Act, 21 U.S.C. section 360bbb-3(b)(1), unless the authorization is terminated or revoked sooner. Performed at [facility], [place] [address], [place], [place] [address]      Radiology Studies: Dg Chest Portable 1 View  Result Date: 12/30/2018 CLINICAL DATA:  Week with shortness of breath. Hypertension. Acute kidney injury. EXAM: PORTABLE CHEST 1 VIEW COMPARISON:  12/15/2018. FINDINGS: Heart size upper limits normal. No consolidation or edema. Calcified tortuous aorta. No effusion or pneumothorax. Improved cardiac size from priors. IMPRESSION: No active disease. Electronically Signed   By: Staci Righter M.D.   On: 12/30/2018 21:29    Scheduled Meds: . amLODipine  10 mg Oral Daily  . atorvastatin  20 mg Oral Daily  . [START ON 01/01/2019] calcitRIOL  0.25 mcg Oral Q M,W,F  . dapsone  100 mg Oral q morning - 10a  . darbepoetin (ARANESP) injection - NON-DIALYSIS  100 mcg  Subcutaneous Q Thu-1800  . furosemide  40 mg Oral Daily  . gemfibrozil  600 mg Oral q morning - 10a  . Gerhardt's butt cream  1 application Topical TID  . hydrALAZINE  50 mg Oral TID  . hydrochlorothiazide  12.5 mg Oral Daily  . insulin aspart  0-9 Units Subcutaneous TID WC  . levothyroxine  50 mcg Oral QAC breakfast  . magnesium oxide  400 mg Oral Q breakfast  . metoprolol succinate  50 mg Oral BID  . multivitamin with minerals  1 tablet Oral Q breakfast  . pantoprazole  20 mg Oral Daily  . predniSONE  60 mg Oral QAC breakfast  . valACYclovir  1,000 mg Oral Daily   Continuous Infusions: . ferumoxytol       LOS: 0 days   Marylu Lund, MD [facility] Pager On Amion  If 7PM-7AM, please contact night-coverage 12/31/2018, 3:13 PM

## 2018-12-31 NOTE — Progress Notes (Signed)
Subjective:  Hemodynamically stable- no UOP recorded- renal function poor but stable-  hgb up from 6.6 to 7.6 with one unit of blood  -  She has no particular complaints today    Objective Vital signs in last 24 hours: Vitals:   12/30/18 2300 12/30/18 2300 12/31/18 0149 12/31/18 0439  BP: (!) 143/58  (!) 135/50 (!) 139/58  Pulse:  74 75 74  Resp:  (!) 21 16 16   Temp:  98.2 F (36.8 C) 97.6 F (36.4 C) (!) 97.5 F (36.4 C)  TempSrc:  Oral Oral Oral  SpO2:  92% 92% 93%  Weight:   57 kg   Height:   5' (1.524 m)    Weight change:   Intake/Output Summary (Last 24 hours) at 12/31/2018 1123 Last data filed at 12/31/2018 0900 Gross per 24 hour  Intake 720.02 ml  Output -  Net 720.02 ml    Assessment/ Plan: Pt is a 85 y.o. yo female with CKD due to ANCA s/p rituximab and pred who was admitted on 12/30/2018 with  Recurrent anemia   Assessment/Plan: 1. CKD-  Slight A on CRF-  Poor renal function which is stable and there are no dialysis indications. Repeat urine pending   2. Anemia- a major issue, this is second hosp for this- now with evidence possibly of GIB. Work up pending- did respond approp to transfusion- iron stores were marginal last hosp- will go ahead and give as well as was dosed with aranesp on 5/22- will re dose   3. HTN/volume-  Edema likely due to kidney disease - will inc lasix to daily   [facility]    Labs: Basic Metabolic Panel: Recent Labs  Lab 12/30/18 1845 12/31/18 0405  NA 132* 136  K 4.2 3.7  CL 100 105  CO2 19* 22  GLUCOSE 232* 78  BUN 53* 49*  CREATININE 2.77* 2.61*  CALCIUM 8.8* 8.6*   Liver Function Tests: No results for input(s): AST, ALT, ALKPHOS, BILITOT, PROT, ALBUMIN in the last 168 hours. No results for input(s): LIPASE, AMYLASE in the last 168 hours. No results for input(s): AMMONIA in the last 168 hours. CBC: Recent Labs  Lab 12/30/18 1845 12/31/18 0405  WBC 6.2 6.8  HGB 6.6* 7.6*  HCT 21.8* 22.6*  MCV 106.9* 95.0  PLT  186 155   Cardiac Enzymes: No results for input(s): CKTOTAL, CKMB, CKMBINDEX, TROPONINI in the last 168 hours. CBG: Recent Labs  Lab 12/31/18 0805  GLUCAP 68*    Iron Studies: No results for input(s): IRON, TIBC, TRANSFERRIN, FERRITIN in the last 72 hours. Studies/Results: Dg Chest Portable 1 View  Result Date: 12/30/2018 CLINICAL DATA:  Week with shortness of breath. Hypertension. Acute kidney injury. EXAM: PORTABLE CHEST 1 VIEW COMPARISON:  12/15/2018. FINDINGS: Heart size upper limits normal. No consolidation or edema. Calcified tortuous aorta. No effusion or pneumothorax. Improved cardiac size from priors. IMPRESSION: No active disease. Electronically Signed   By: Staci Righter M.D.   On: 12/30/2018 21:29   Medications: Infusions:   Scheduled Medications: . amLODipine  10 mg Oral Daily  . atorvastatin  20 mg Oral Daily  . [START ON 01/01/2019] calcitRIOL  0.25 mcg Oral Q M,W,F  . dapsone  100 mg Oral q morning - 10a  . [START ON 01/01/2019] furosemide  40 mg Oral Q M,W,F  . gemfibrozil  600 mg Oral q morning - 10a  . Gerhardt's butt cream  1 application Topical TID  . hydrALAZINE  50 mg  Oral TID  . hydrochlorothiazide  12.5 mg Oral Daily  . insulin aspart  0-9 Units Subcutaneous TID WC  . levothyroxine  50 mcg Oral QAC breakfast  . magnesium oxide  400 mg Oral Q breakfast  . metoprolol succinate  50 mg Oral BID  . multivitamin with minerals  1 tablet Oral Q breakfast  . pantoprazole  20 mg Oral Daily  . predniSONE  60 mg Oral QAC breakfast  . valACYclovir  1,000 mg Oral Daily    have reviewed scheduled and prn medications.  Physical Exam: General: NAD Heart: RRR Lungs: mostly clear Abdomen: soft, non tender Extremities: pitting edema    12/31/2018,11:23 AM  LOS: 0 days

## 2019-01-01 DIAGNOSIS — D62 Acute posthemorrhagic anemia: Secondary | ICD-10-CM

## 2019-01-01 DIAGNOSIS — R195 Other fecal abnormalities: Secondary | ICD-10-CM

## 2019-01-01 LAB — GLUCOSE, CAPILLARY
Glucose-Capillary: 227 mg/dL — ABNORMAL HIGH (ref 70–99)
Glucose-Capillary: 76 mg/dL (ref 70–99)
Glucose-Capillary: 139 mg/dL — ABNORMAL HIGH (ref 70–99)
Glucose-Capillary: 236 mg/dL — ABNORMAL HIGH (ref 70–99)
Glucose-Capillary: 158 mg/dL — ABNORMAL HIGH (ref 70–99)

## 2019-01-01 LAB — CBC
HCT: 27.6 % — ABNORMAL LOW (ref 36.0–46.0)
Hemoglobin: 9.1 g/dL — ABNORMAL LOW (ref 12.0–15.0)
MCH: 31.6 pg (ref 26.0–34.0)
MCHC: 33 g/dL (ref 30.0–36.0)
MCV: 95.8 fL (ref 80.0–100.0)
Platelets: 187 10*3/uL (ref 150–400)
RBC: 2.88 MIL/uL — ABNORMAL LOW (ref 3.87–5.11)
RDW: 21.5 % — ABNORMAL HIGH (ref 11.5–15.5)
WBC: 8.4 10*3/uL (ref 4.0–10.5)
nRBC: 8.1 % — ABNORMAL HIGH (ref 0.0–0.2)

## 2019-01-01 LAB — RENAL FUNCTION PANEL
Albumin: 2.9 g/dL — ABNORMAL LOW (ref 3.5–5.0)
Anion gap: 14 (ref 5–15)
BUN: 50 mg/dL — ABNORMAL HIGH (ref 8–23)
CO2: 21 mmol/L — ABNORMAL LOW (ref 22–32)
Calcium: 8.8 mg/dL — ABNORMAL LOW (ref 8.9–10.3)
Chloride: 102 mmol/L (ref 98–111)
Creatinine, Ser: 2.72 mg/dL — ABNORMAL HIGH (ref 0.44–1.00)
GFR calc Af Amer: 18 mL/min — ABNORMAL LOW (ref >60)
GFR calc non Af Amer: 16 mL/min — ABNORMAL LOW (ref >60)
Glucose, Bld: 117 mg/dL — ABNORMAL HIGH (ref 70–99)
Phosphorus: 4.1 mg/dL (ref 2.5–4.6)
Potassium: 3.6 mmol/L (ref 3.5–5.1)
Sodium: 137 mmol/L (ref 135–145)

## 2019-01-01 MED ORDER — PEG-KCL-NACL-NASULF-NA ASC-C 100 G PO SOLR
0.5000 | Freq: Once | ORAL | Status: AC
  Administered 2019-01-01: 100 g via ORAL
  Filled 2019-01-01: qty 1

## 2019-01-01 MED ORDER — BISACODYL 5 MG PO TBEC
20.0000 mg | DELAYED_RELEASE_TABLET | Freq: Once | ORAL | Status: AC
  Administered 2019-01-01: 20 mg via ORAL
  Filled 2019-01-01: qty 4

## 2019-01-01 MED ORDER — PEG-KCL-NACL-NASULF-NA ASC-C 100 G PO SOLR: 1.0000 | Freq: Once | ORAL | Status: DC

## 2019-01-01 MED ORDER — METOCLOPRAMIDE HCL 5 MG/ML IJ SOLN
10.0000 mg | Freq: Once | INTRAMUSCULAR | Status: AC
  Administered 2019-01-02: 10 mg via INTRAVENOUS
  Filled 2019-01-01: qty 2

## 2019-01-01 MED ORDER — METOCLOPRAMIDE HCL 5 MG/ML IJ SOLN
10.0000 mg | Freq: Once | INTRAMUSCULAR | Status: AC
  Administered 2019-01-01: 10 mg via INTRAVENOUS
  Filled 2019-01-01: qty 2

## 2019-01-01 MED ORDER — DEXTROSE-NACL 5-0.9 % IV SOLN
INTRAVENOUS | Status: DC
  Administered 2019-01-01 – 2019-01-02 (×2): via INTRAVENOUS

## 2019-01-01 NOTE — Progress Notes (Signed)
Subjective:  Hemodynamically stable- 1300 UOP recorded- renal function poor but stable-  hgb up  to 9.1  -  She has no particular complaints today - planning on colonoscopy ? today    Objective Vital signs in last 24 hours: Vitals:   12/31/18 2301 12/31/18 2327 01/01/19 0646 01/01/19 0836  BP: (!) 135/54 (!) 135/54  (!) 139/51  Pulse: 76 76  66  Resp:    18  Temp: 97.8 F (36.6 C)   97.7 F (36.5 C)  TempSrc: Oral   Oral  SpO2: (!) 84%   (!) 89%  Weight:   54.9 kg   Height:       Weight change: -2.07 kg  Intake/Output Summary (Last 24 hours) at 01/01/2019 0849 Last data filed at 12/31/2018 2158 Gross per 24 hour  Intake 720 ml  Output 1300 ml  Net -580 ml    Assessment/ Plan: Pt is a 85 y.o. yo female with CKD due to ANCA s/p rituximab and pred who was admitted on 12/30/2018 with  Recurrent anemia   Assessment/Plan: 1. CKD-  Slight A on CRF-  Poor renal function which is stable and there are no dialysis indications. Repeat urinalysis not done  2. Anemia- a major issue, this is second hosp for this- now with evidence possibly of GIB. Work up pending, possibly colonoscopy- did respond approp to transfusion- iron stores were marginal last hosp- gave feraheme and dosed with aranesp as well.  I heard from Dr. Royce Macadamia and she said to stop dapsone as it can cause anemia- was on it for prophylaxis from immune suppressing regimen  3. HTN/volume-  Edema likely due to kidney disease - have increased lasix to daily  4. Shingles-  Valtrex and airborne precautions  Due to high consult volume will sign off- follow at a distance- I have done what I can do in terms of her anemia   Emily Cannon    Labs: Basic Metabolic Panel: Recent Labs  Lab 12/30/18 1845 12/31/18 0405 01/01/19 0613  NA 132* 136 137  K 4.2 3.7 3.6  CL 100 105 102  CO2 19* 22 21*  GLUCOSE 232* 78 117*  BUN 53* 49* 50*  CREATININE 2.77* 2.61* 2.72*  CALCIUM 8.8* 8.6* 8.8*  PHOS  --   --  4.1   Liver Function  Tests: Recent Labs  Lab 01/01/19 0613  ALBUMIN 2.9*   No results for input(s): LIPASE, AMYLASE in the last 168 hours. No results for input(s): AMMONIA in the last 168 hours. CBC: Recent Labs  Lab 12/30/18 1845 12/31/18 0405 01/01/19 0613  WBC 6.2 6.8 8.4  HGB 6.6* 7.6* 9.1*  HCT 21.8* 22.6* 27.6*  MCV 106.9* 95.0 95.8  PLT 186 155 187   Cardiac Enzymes: No results for input(s): CKTOTAL, CKMB, CKMBINDEX, TROPONINI in the last 168 hours. CBG: Recent Labs  Lab 12/31/18 1625 12/31/18 1935 12/31/18 2304 01/01/19 0317 01/01/19 0831  GLUCAP 108* 177* 304* 227* 76    Iron Studies: No results for input(s): IRON, TIBC, TRANSFERRIN, FERRITIN in the last 72 hours. Studies/Results: Dg Chest Portable 1 View  Result Date: 12/30/2018 CLINICAL DATA:  Week with shortness of breath. Hypertension. Acute kidney injury. EXAM: PORTABLE CHEST 1 VIEW COMPARISON:  12/15/2018. FINDINGS: Heart size upper limits normal. No consolidation or edema. Calcified tortuous aorta. No effusion or pneumothorax. Improved cardiac size from priors. IMPRESSION: No active disease. Electronically Signed   By: Staci Righter M.D.   On: 12/30/2018 21:29   Medications:  Infusions: . sodium chloride 10 mL/hr at 12/31/18 1608  . ferumoxytol 510 mg (12/31/18 1609)    Scheduled Medications: . amLODipine  10 mg Oral Daily  . atorvastatin  20 mg Oral Daily  . calcitRIOL  0.25 mcg Oral Q M,W,F  . dapsone  100 mg Oral q morning - 10a  . darbepoetin (ARANESP) injection - NON-DIALYSIS  100 mcg Subcutaneous Q Thu-1800  . furosemide  40 mg Oral Daily  . gemfibrozil  600 mg Oral q morning - 10a  . Gerhardt's butt cream  1 application Topical TID  . hydrALAZINE  50 mg Oral TID  . hydrochlorothiazide  12.5 mg Oral Daily  . insulin aspart  0-9 Units Subcutaneous TID WC  . levothyroxine  50 mcg Oral QAC breakfast  . magnesium oxide  400 mg Oral Q breakfast  . metoprolol succinate  50 mg Oral BID  . multivitamin with minerals   1 tablet Oral Q breakfast  . pantoprazole  20 mg Oral Daily  . peg 3350 powder  1 kit Oral Once  . predniSONE  60 mg Oral QAC breakfast  . valACYclovir  1,000 mg Oral Daily    have reviewed scheduled and prn medications.  Physical Exam: General: NAD- seems happy  Heart: RRR Lungs: mostly clear Abdomen: soft, non tender Extremities: pitting edema    01/01/2019,8:49 AM  LOS: 1 day

## 2019-01-01 NOTE — Progress Notes (Addendum)
Daily Rounding Note  01/01/2019, 10:05 AM  LOS: 1 day   SUBJECTIVE:   Chief complaint:     Never got bowel prep last night Large brown stool this AM.  No recurrent bleeding from perineum or with BM.  No GI problems.  Hungry as she is NPO (for now cxld EGD/Colon)  OBJECTIVE:         Vital signs in last 24 hours:    Temp:  [97.5 F (36.4 C)-97.8 F (36.6 C)] 97.7 F (36.5 C) (06/05 0836) Pulse Rate:  [66-76] 66 (06/05 0836) Resp:  [18] 18 (06/05 0836) BP: (131-139)/(51-54) 139/51 (06/05 0836) SpO2:  [84 %-89 %] 89 % (06/05 0836) Weight:  [54.9 kg] 54.9 kg (06/05 0646)   Filed Weights   12/31/18 0149 01/01/19 0646  Weight: 57 kg 54.9 kg   General: pale, slightly cushingoid.  NAD   Heart: RRR Chest: clear bil.  No SOB or cough Abdomen: soft, NT, ND.  Active BS  Extremities: slight LE edema Neuro/Psych:  Oriented x 3.  Calm, pleasant.  No tremor, weakness, gross deficits.    Intake/Output from previous day: 06/04 0701 - 06/05 0700 In: 720 [P.O.:720] Out: 1300 [Urine:1300]  Intake/Output this shift: No intake/output data recorded.  Lab Results: Recent Labs    12/30/18 1845 12/31/18 0405 01/01/19 0613  WBC 6.2 6.8 8.4  HGB 6.6* 7.6* 9.1*  HCT 21.8* 22.6* 27.6*  PLT 186 155 187   BMET Recent Labs    12/30/18 1845 12/31/18 0405 01/01/19 0613  NA 132* 136 137  K 4.2 3.7 3.6  CL 100 105 102  CO2 19* 22 21*  GLUCOSE 232* 78 117*  BUN 53* 49* 50*  CREATININE 2.77* 2.61* 2.72*  CALCIUM 8.8* 8.6* 8.8*   LFT Recent Labs    01/01/19 0613  ALBUMIN 2.9*    Studies/Results: Dg Chest Portable 1 View  Result Date: 12/30/2018 CLINICAL DATA:  Week with shortness of breath. Hypertension. Acute kidney injury. EXAM: PORTABLE CHEST 1 VIEW COMPARISON:  12/15/2018. FINDINGS: Heart size upper limits normal. No consolidation or edema. Calcified tortuous aorta. No effusion or pneumothorax. Improved cardiac size  from priors. IMPRESSION: No active disease. Electronically Signed   By: Staci Righter M.D.   On: 12/30/2018 21:29   Scheduled Meds: . amLODipine  10 mg Oral Daily  . atorvastatin  20 mg Oral Daily  . calcitRIOL  0.25 mcg Oral Q M,W,F  . darbepoetin (ARANESP) injection - NON-DIALYSIS  100 mcg Subcutaneous Q Thu-1800  . furosemide  40 mg Oral Daily  . gemfibrozil  600 mg Oral q morning - 10a  . Gerhardt's butt cream  1 application Topical TID  . hydrALAZINE  50 mg Oral TID  . hydrochlorothiazide  12.5 mg Oral Daily  . insulin aspart  0-9 Units Subcutaneous TID WC  . levothyroxine  50 mcg Oral QAC breakfast  . magnesium oxide  400 mg Oral Q breakfast  . metoprolol succinate  50 mg Oral BID  . multivitamin with minerals  1 tablet Oral Q breakfast  . pantoprazole  20 mg Oral Daily  . peg 3350 powder  1 kit Oral Once  . predniSONE  60 mg Oral QAC breakfast  . valACYclovir  1,000 mg Oral Daily   Continuous Infusions: . sodium chloride 10 mL/hr at 12/31/18 1608  . dextrose 5 % and 0.9% NaCl    . ferumoxytol 510 mg (12/31/18 1609)   PRN Meds:.sodium chloride,  acetaminophen **OR** acetaminophen, ondansetron **OR** ondansetron (ZOFRAN) IV   ASSESMENT:   *   FOBT + anemia.  Renal added Aranesp q 2 week.  S/p Feraheme 5/6, 2nd dose planned for 1 week.   Hgb 6.6 >> 9.1.  S/p 1 U PRBC.   Transfused for Hgb 5.5 on 5/19 as well.    *   MPA.  High dose Prednisone, Dapsone in place, later now discontinued due to it's possibly causing anemia.    *    Perineal rash, suspect Herpes Zoster. Minor peri-rectal bleeding from this.  PO Acyclovir in place.  Interestingly this rash is not, has never, caused pain.      *   Stage 4 CKD    PLAN   *   Aim for colonoscopy, EGD @ 0800 tomorrow.  Dulcolax @ noon.  Split movi-prep starts tonight. Clears today.      Azucena Freed  01/01/2019, 10:05 AM Phone [phone]  GI ATTENDING  Interval history data reviewed.  Agree with interval progress note as  outlined above.  Patient was tentatively planned for colonoscopy and upper endoscopy today with Dr. Havery Moros to evaluate anemia and Hemoccult-positive stool.  Hemoglobin up to 9.1.  Stools are brown.  Unfortunately, previously ordered prep was not instituted.  Prep orders placed again and the procedures rescheduled with Dr. Havery Moros for tomorrow.  Docia Chuck. Geri Seminole., M.D. [facility] Division of Gastroenterology

## 2019-01-01 NOTE — Progress Notes (Signed)
PROGRESS NOTE    Emily Cannon  BJY:[id number] DOB: [birth year] DOA: 12/30/2018 PCP: Eulas Post, MD    Brief Narrative:  85 y.o. female with history of chronic kidney disease with microscopic polyangiitis on prednisone, diabetes mellitus, hypertension was recently admitted for symptomatic anemia and at the time of discharge hemoglobin was around 9.2 on Dec 19, 2018 that is around 10 days ago had routine labs done by patient's nephrologist and was found to hemoglobin around 6 and was advised to come to the ER.  Patient has been recently diagnosed with herpes zoster of the buttock area and has been on valacyclovir for 2 weeks by patient's dermatologist.  Her lesions have healed but has had been some skin cracking around the anal area from the previous lesions and hematologist has advised to continue the valacyclovir for 1 more week.  Otherwise denies any chest pain or shortness of breath nausea vomiting or abdominal pain.  ED Course: In the ER patient's creatinine is increased from 2.210 days ago to 2.7.  Hemoglobin 6.6 platelets 186 UA is pending.  On exam patient has bilateral lower extremity edema.  Per patient son was at the bedside patient's Lasix dose was recently increased by nephrologist.  Nephrologist on-call has been consulted patient admitted for acute blood loss anemia.  Stool for occult blood was positive.  Assessment & Plan:   Principal Problem:   Acute blood loss anemia Active Problems:   Essential hypertension   Hypothyroidism   Microscopic polyangiitis (HCC)   AKI (acute kidney injury) ([place])   1. Acute blood loss anemia 1. Required 1 unit of PRBC transfusion thus far 2. Stools are heme pos 3. GI consulted, appreciate input. Plan for endoscopy tomorrow 4. Chart reviewed, last colon noted in 2014 with findings of diverticulosis, otherwise unremarkable 2. Acute on chronic disease with microscopic polyangiitis on prednisone at this time as received rituximab  previously.   1. Nephrology consulted and following 2. Stable at this time 3. Recheck bmet in AM 3. Hypothyroidism 1. Continue synthroid as tolerated  2. Stable at this time 4. Hypertension 1. Continued on amlodipine metoprolol hydralazine hydrochlorothiazide. 2. BP remains stable at this time 5. Hyperlipidemia on statins and gemfibrozil. 1. Currently stable 6. Recently treated herpes zoster of the buttock area.   1. Patient recently saw dermatology advised to continue with valacyclovir for 1 more week.  Has 6 more days to go.  Continue valacyclovir and recommended 1 g daily for more 6 days after admit 2. Rash noted to be in multiple stages of healing throughout multiple dermatomes across bilateral sacral, gluteal regions. Per infection control, no longer need airborne precautions  DVT prophylaxis: SCD's Code Status: Full Family Communication: Pt in room, family not at bedside Disposition Plan: Uncertain at this time  Consultants:   GI  Nephrology  Procedures:     Antimicrobials: Anti-infectives (From admission, onward)   Start     Dose/Rate Route Frequency Ordered Stop   12/31/18 1000  dapsone tablet 100 mg  Status:  Discontinued     100 mg Oral  Every morning - 10a 12/30/18 2206 01/01/19 0859   12/31/18 1000  valACYclovir (VALTREX) tablet 1,000 mg     1,000 mg Oral Daily 12/31/18 0105 01/06/19 0959      Subjective: Without complaints at this time  Objective: Vitals:   12/31/18 2301 12/31/18 2327 01/01/19 0646 01/01/19 0836  BP: (!) 135/54 (!) 135/54  (!) 139/51  Pulse: 76 76  66  Resp:  18  Temp: 97.8 F (36.6 C)   97.7 F (36.5 C)  TempSrc: Oral   Oral  SpO2: (!) 84%   (!) 89%  Weight:   54.9 kg   Height:        Intake/Output Summary (Last 24 hours) at 01/01/2019 1637 Last data filed at 12/31/2018 2158 Gross per 24 hour  Intake -  Output 1300 ml  Net -1300 ml   Filed Weights   12/31/18 0149 01/01/19 0646  Weight: 57 kg 54.9 kg    Examination:  General exam: Awake, laying in bed, in nad Respiratory system: Normal respiratory effort, no wheezing Cardiovascular system: regular rate, s1, s2 Gastrointestinal system: Soft, nondistended, positive BS Central nervous system: CN2-12 grossly intact, strength intact Extremities: Perfused, no clubbing Skin: Normal skin turgor, no notable skin lesions seen Psychiatry: Mood normal // no visual hallucinations   Data Reviewed: I have personally reviewed following labs and imaging studies  CBC: Recent Labs  Lab 12/30/18 1845 12/31/18 0405 01/01/19 0613  WBC 6.2 6.8 8.4  HGB 6.6* 7.6* 9.1*  HCT 21.8* 22.6* 27.6*  MCV 106.9* 95.0 95.8  PLT 186 155 711   Basic Metabolic Panel: Recent Labs  Lab 12/30/18 1845 12/31/18 0405 01/01/19 0613  NA 132* 136 137  K 4.2 3.7 3.6  CL 100 105 102  CO2 19* 22 21*  GLUCOSE 232* 78 117*  BUN 53* 49* 50*  CREATININE 2.77* 2.61* 2.72*  CALCIUM 8.8* 8.6* 8.8*  PHOS  --   --  4.1   GFR: Estimated Creatinine Clearance: 12.2 mL/min (A) (by C-G formula based on SCr of 2.72 mg/dL (H)). Liver Function Tests: Recent Labs  Lab 01/01/19 0613  ALBUMIN 2.9*   No results for input(s): LIPASE, AMYLASE in the last 168 hours. No results for input(s): AMMONIA in the last 168 hours. Coagulation Profile: No results for input(s): INR, PROTIME in the last 168 hours. Cardiac Enzymes: No results for input(s): CKTOTAL, CKMB, CKMBINDEX, TROPONINI in the last 168 hours. BNP (last 3 results) No results for input(s): PROBNP in the last 8760 hours. HbA1C: No results for input(s): HGBA1C in the last 72 hours. CBG: Recent Labs  Lab 12/31/18 1935 12/31/18 2304 01/01/19 0317 01/01/19 0831 01/01/19 1221  GLUCAP 177* 304* 227* 76 139*   Lipid Profile: No results for input(s): CHOL, HDL, LDLCALC, TRIG, CHOLHDL, LDLDIRECT in the last 72 hours. Thyroid Function Tests: No results for input(s): TSH, T4TOTAL, FREET4, T3FREE, THYROIDAB in the last 72 hours. Anemia  Panel: No results for input(s): VITAMINB12, FOLATE, FERRITIN, TIBC, IRON, RETICCTPCT in the last 72 hours. Sepsis Labs: No results for input(s): PROCALCITON, LATICACIDVEN in the last 168 hours.  Recent Results (from the past 240 hour(s))  SARS Coronavirus 2 (CEPHEID - Performed in [place] hospital lab), Hosp Order     Status: None   Collection Time: 12/30/18  9:25 PM  Result Value Ref Range Status   SARS Coronavirus 2 NEGATIVE NEGATIVE Final    Comment: (NOTE) If result is NEGATIVE SARS-CoV-2 target nucleic acids are NOT DETECTED. The SARS-CoV-2 RNA is generally detectable in upper and lower  respiratory specimens during the acute phase of infection. The lowest  concentration of SARS-CoV-2 viral copies this assay can detect is 250  copies / mL. A negative result does not preclude SARS-CoV-2 infection  and should not be used as the sole basis for treatment or other  patient management decisions.  A negative result may occur with  improper specimen collection / handling,  submission of specimen other  than nasopharyngeal swab, presence of viral mutation(s) within the  areas targeted by this assay, and inadequate number of viral copies  (<250 copies / mL). A negative result must be combined with clinical  observations, patient history, and epidemiological information. If result is POSITIVE SARS-CoV-2 target nucleic acids are DETECTED. The SARS-CoV-2 RNA is generally detectable in upper and lower  respiratory specimens dur ing the acute phase of infection.  Positive  results are indicative of active infection with SARS-CoV-2.  Clinical  correlation with patient history and other diagnostic information is  necessary to determine patient infection status.  Positive results do  not rule out bacterial infection or co-infection with other viruses. If result is PRESUMPTIVE POSTIVE SARS-CoV-2 nucleic acids MAY BE PRESENT.   A presumptive positive result was obtained on the submitted  specimen  and confirmed on repeat testing.  While 2019 novel coronavirus  (SARS-CoV-2) nucleic acids may be present in the submitted sample  additional confirmatory testing may be necessary for epidemiological  and / or clinical management purposes  to differentiate between  SARS-CoV-2 and other Sarbecovirus currently known to infect humans.  If clinically indicated additional testing with an alternate test  methodology [phone]) is advised. The SARS-CoV-2 RNA is generally  detectable in upper and lower respiratory sp ecimens during the acute  phase of infection. The expected result is Negative. Fact Sheet for Patients:  StrictlyIdeas.no Fact Sheet for Healthcare Providers: BankingDealers.co.za This test is not yet approved or cleared by the [place] FDA and has been authorized for detection and/or diagnosis of SARS-CoV-2 by FDA under an Emergency Use Authorization (EUA).  This EUA will remain in effect (meaning this test can be used) for the duration of the COVID-19 declaration under Section 564(b)(1) of the Act, 21 U.S.C. section 360bbb-3(b)(1), unless the authorization is terminated or revoked sooner. Performed at [facility], [place] [address] [place], [place] [address]      Radiology Studies: Dg Chest Portable 1 View  Result Date: 12/30/2018 CLINICAL DATA:  Week with shortness of breath. Hypertension. Acute kidney injury. EXAM: PORTABLE CHEST 1 VIEW COMPARISON:  12/15/2018. FINDINGS: Heart size upper limits normal. No consolidation or edema. Calcified tortuous aorta. No effusion or pneumothorax. Improved cardiac size from priors. IMPRESSION: No active disease. Electronically Signed   By: Staci Righter M.D.   On: 12/30/2018 21:29    Scheduled Meds: . amLODipine  10 mg Oral Daily  . atorvastatin  20 mg Oral Daily  . calcitRIOL  0.25 mcg Oral Q M,W,F  . darbepoetin (ARANESP) injection - NON-DIALYSIS  100 mcg Subcutaneous Q  Thu-1800  . furosemide  40 mg Oral Daily  . gemfibrozil  600 mg Oral q morning - 10a  . Gerhardt's butt cream  1 application Topical TID  . hydrALAZINE  50 mg Oral TID  . hydrochlorothiazide  12.5 mg Oral Daily  . insulin aspart  0-9 Units Subcutaneous TID WC  . levothyroxine  50 mcg Oral QAC breakfast  . magnesium oxide  400 mg Oral Q breakfast  . metoCLOPramide (REGLAN) injection  10 mg Intravenous Once   Followed by  . metoCLOPramide (REGLAN) injection  10 mg Intravenous Once  . metoprolol succinate  50 mg Oral BID  . multivitamin with minerals  1 tablet Oral Q breakfast  . pantoprazole  20 mg Oral Daily  . peg 3350 powder  0.5 kit Oral Once   And  . [START ON 01/02/2019] peg 3350 powder  0.5  kit Oral Once  . predniSONE  60 mg Oral QAC breakfast  . valACYclovir  1,000 mg Oral Daily   Continuous Infusions: . sodium chloride 10 mL/hr at 12/31/18 1608  . dextrose 5 % and 0.9% NaCl 50 mL/hr at 01/01/19 1059  . ferumoxytol 510 mg (12/31/18 1609)     LOS: 1 day   Marylu Lund, MD [facility] Pager On Amion  If 7PM-7AM, please contact night-coverage 01/01/2019, 4:37 PM

## 2019-01-01 NOTE — H&P (View-Only) (Signed)
Daily Rounding Note  01/01/2019, 10:05 AM  LOS: 1 day   SUBJECTIVE:   Chief complaint:     Never got bowel prep last night Large brown stool this AM.  No recurrent bleeding from perineum or with BM.  No GI problems.  Hungry as she is NPO (for now cxld EGD/Colon)  OBJECTIVE:         Vital signs in last 24 hours:    Temp:  [97.5 F (36.4 C)-97.8 F (36.6 C)] 97.7 F (36.5 C) (06/05 0836) Pulse Rate:  [66-76] 66 (06/05 0836) Resp:  [18] 18 (06/05 0836) BP: (131-139)/(51-54) 139/51 (06/05 0836) SpO2:  [84 %-89 %] 89 % (06/05 0836) Weight:  [54.9 kg] 54.9 kg (06/05 0646)   Filed Weights   12/31/18 0149 01/01/19 0646  Weight: 57 kg 54.9 kg   General: pale, slightly cushingoid.  NAD   Heart: RRR Chest: clear bil.  No SOB or cough Abdomen: soft, NT, ND.  Active BS  Extremities: slight LE edema Neuro/Psych:  Oriented x 3.  Calm, pleasant.  No tremor, weakness, gross deficits.    Intake/Output from previous day: 06/04 0701 - 06/05 0700 In: 720 [P.O.:720] Out: 1300 [Urine:1300]  Intake/Output this shift: No intake/output data recorded.  Lab Results: Recent Labs    12/30/18 1845 12/31/18 0405 01/01/19 0613  WBC 6.2 6.8 8.4  HGB 6.6* 7.6* 9.1*  HCT 21.8* 22.6* 27.6*  PLT 186 155 187   BMET Recent Labs    12/30/18 1845 12/31/18 0405 01/01/19 0613  NA 132* 136 137  K 4.2 3.7 3.6  CL 100 105 102  CO2 19* 22 21*  GLUCOSE 232* 78 117*  BUN 53* 49* 50*  CREATININE 2.77* 2.61* 2.72*  CALCIUM 8.8* 8.6* 8.8*   LFT Recent Labs    01/01/19 0613  ALBUMIN 2.9*    Studies/Results: Dg Chest Portable 1 View  Result Date: 12/30/2018 CLINICAL DATA:  Week with shortness of breath. Hypertension. Acute kidney injury. EXAM: PORTABLE CHEST 1 VIEW COMPARISON:  12/15/2018. FINDINGS: Heart size upper limits normal. No consolidation or edema. Calcified tortuous aorta. No effusion or pneumothorax. Improved cardiac size  from priors. IMPRESSION: No active disease. Electronically Signed   By: Staci Righter M.D.   On: 12/30/2018 21:29   Scheduled Meds: . amLODipine  10 mg Oral Daily  . atorvastatin  20 mg Oral Daily  . calcitRIOL  0.25 mcg Oral Q M,W,F  . darbepoetin (ARANESP) injection - NON-DIALYSIS  100 mcg Subcutaneous Q Thu-1800  . furosemide  40 mg Oral Daily  . gemfibrozil  600 mg Oral q morning - 10a  . Gerhardt's butt cream  1 application Topical TID  . hydrALAZINE  50 mg Oral TID  . hydrochlorothiazide  12.5 mg Oral Daily  . insulin aspart  0-9 Units Subcutaneous TID WC  . levothyroxine  50 mcg Oral QAC breakfast  . magnesium oxide  400 mg Oral Q breakfast  . metoprolol succinate  50 mg Oral BID  . multivitamin with minerals  1 tablet Oral Q breakfast  . pantoprazole  20 mg Oral Daily  . peg 3350 powder  1 kit Oral Once  . predniSONE  60 mg Oral QAC breakfast  . valACYclovir  1,000 mg Oral Daily   Continuous Infusions: . sodium chloride 10 mL/hr at 12/31/18 1608  . dextrose 5 % and 0.9% NaCl    . ferumoxytol 510 mg (12/31/18 1609)   PRN Meds:.sodium chloride,  acetaminophen **OR** acetaminophen, ondansetron **OR** ondansetron (ZOFRAN) IV   ASSESMENT:   *   FOBT + anemia.  Renal added Aranesp q 2 week.  S/p Feraheme 5/6, 2nd dose planned for 1 week.   Hgb 6.6 >> 9.1.  S/p 1 U PRBC.   Transfused for Hgb 5.5 on 5/19 as well.    *   MPA.  High dose Prednisone, Dapsone in place, later now discontinued due to it's possibly causing anemia.    *    Perineal rash, suspect Herpes Zoster. Minor peri-rectal bleeding from this.  PO Acyclovir in place.  Interestingly this rash is not, has never, caused pain.      *   Stage 4 CKD    PLAN   *   Aim for colonoscopy, EGD @ 0800 tomorrow.  Dulcolax @ noon.  Split movi-prep starts tonight. Clears today.      Azucena Freed  01/01/2019, 10:05 AM Phone [phone]  GI ATTENDING  Interval history data reviewed.  Agree with interval progress note as  outlined above.  Patient was tentatively planned for colonoscopy and upper endoscopy today with Dr. Havery Moros to evaluate anemia and Hemoccult-positive stool.  Hemoglobin up to 9.1.  Stools are brown.  Unfortunately, previously ordered prep was not instituted.  Prep orders placed again and the procedures rescheduled with Dr. Havery Moros for tomorrow.  Docia Chuck. Geri Seminole., M.D. [facility] Division of Gastroenterology

## 2019-01-01 NOTE — Plan of Care (Signed)
  Problem: Education: Goal: Knowledge of General Education information will improve Description: Including pain rating scale, medication(s)/side effects and non-pharmacologic comfort measures Outcome: Progressing   Problem: Clinical Measurements: Goal: Ability to maintain clinical measurements within normal limits will improve Outcome: Progressing Goal: Will remain free from infection Outcome: Progressing   

## 2019-01-02 ENCOUNTER — Encounter (HOSPITAL_COMMUNITY)
Admission: EM | Disposition: A | Discharge status: Home / Self Care | Payer: Self-pay | Source: Home / Self Care | Attending: Internal Medicine

## 2019-01-02 ENCOUNTER — Inpatient Hospital Stay (HOSPITAL_COMMUNITY): Payer: Medicare Other | Admitting: Registered Nurse

## 2019-01-02 ENCOUNTER — Encounter (HOSPITAL_COMMUNITY): Payer: Self-pay | Admitting: *Deleted

## 2019-01-02 DIAGNOSIS — D126 Benign neoplasm of colon, unspecified: Secondary | ICD-10-CM

## 2019-01-02 DIAGNOSIS — R195 Other fecal abnormalities: Secondary | ICD-10-CM

## 2019-01-02 HISTORY — PX: POLYPECTOMY: SHX5525

## 2019-01-02 HISTORY — PX: ESOPHAGOGASTRODUODENOSCOPY (EGD) WITH PROPOFOL: SHX5813

## 2019-01-02 HISTORY — PX: BIOPSY: SHX5522

## 2019-01-02 HISTORY — PX: COLONOSCOPY WITH PROPOFOL: SHX5780

## 2019-01-02 LAB — RENAL FUNCTION PANEL
Albumin: 2.6 g/dL — ABNORMAL LOW (ref 3.5–5.0)
Anion gap: 14 (ref 5–15)
BUN: 37 mg/dL — ABNORMAL HIGH (ref 8–23)
CO2: 22 mmol/L (ref 22–32)
Calcium: 8.6 mg/dL — ABNORMAL LOW (ref 8.9–10.3)
Chloride: 105 mmol/L (ref 98–111)
Creatinine, Ser: 2.3 mg/dL — ABNORMAL HIGH (ref 0.44–1.00)
GFR calc Af Amer: 22 mL/min — ABNORMAL LOW (ref >60)
GFR calc non Af Amer: 19 mL/min — ABNORMAL LOW (ref >60)
Glucose, Bld: 143 mg/dL — ABNORMAL HIGH (ref 70–99)
Phosphorus: 4.6 mg/dL (ref 2.5–4.6)
Potassium: 2.9 mmol/L — ABNORMAL LOW (ref 3.5–5.1)
Sodium: 141 mmol/L (ref 135–145)

## 2019-01-02 LAB — GLUCOSE, CAPILLARY
Glucose-Capillary: 94 mg/dL (ref 70–99)
Glucose-Capillary: 286 mg/dL — ABNORMAL HIGH (ref 70–99)
Glucose-Capillary: 239 mg/dL — ABNORMAL HIGH (ref 70–99)

## 2019-01-02 LAB — CBC
HCT: 26.8 % — ABNORMAL LOW (ref 36.0–46.0)
Hemoglobin: 8.8 g/dL — ABNORMAL LOW (ref 12.0–15.0)
MCH: 31.7 pg (ref 26.0–34.0)
MCHC: 32.8 g/dL (ref 30.0–36.0)
MCV: 96.4 fL (ref 80.0–100.0)
Platelets: 182 10*3/uL (ref 150–400)
RBC: 2.78 MIL/uL — ABNORMAL LOW (ref 3.87–5.11)
RDW: 21.8 % — ABNORMAL HIGH (ref 11.5–15.5)
WBC: 7.7 10*3/uL (ref 4.0–10.5)
nRBC: 6.9 % — ABNORMAL HIGH (ref 0.0–0.2)

## 2019-01-02 SURGERY — COLONOSCOPY WITH PROPOFOL
Anesthesia: Monitor Anesthesia Care

## 2019-01-02 MED ORDER — PANTOPRAZOLE SODIUM 40 MG PO TBEC
40.0000 mg | DELAYED_RELEASE_TABLET | Freq: Two times a day (BID) | ORAL | Status: DC
  Administered 2019-01-02 – 2019-01-03 (×3): 40 mg via ORAL
  Filled 2019-01-02 (×2): qty 1

## 2019-01-02 MED ORDER — ONDANSETRON HCL 4 MG/2ML IJ SOLN
INTRAMUSCULAR | Status: DC | PRN
  Administered 2019-01-02: 4 mg via INTRAVENOUS

## 2019-01-02 MED ORDER — SODIUM CHLORIDE 0.9 % IV SOLN
INTRAVENOUS | Status: DC
  Administered 2019-01-02: 10:00:00 via INTRAVENOUS
  Administered 2019-01-02: 500 mL via INTRAVENOUS

## 2019-01-02 MED ORDER — EPHEDRINE SULFATE 50 MG/ML IJ SOLN
INTRAMUSCULAR | Status: DC | PRN
  Administered 2019-01-02: 10 mg via INTRAVENOUS
  Administered 2019-01-02: 5 mg via INTRAVENOUS

## 2019-01-02 MED ORDER — PROPOFOL 500 MG/50ML IV EMUL
INTRAVENOUS | Status: DC | PRN
  Administered 2019-01-02: 100 ug/kg/min via INTRAVENOUS

## 2019-01-02 MED ORDER — HYDROCORTISONE ACETATE 25 MG RE SUPP
25.0000 mg | Freq: Two times a day (BID) | RECTAL | Status: DC
  Administered 2019-01-02 – 2019-01-03 (×3): 25 mg via RECTAL
  Filled 2019-01-02 (×4): qty 1

## 2019-01-02 MED ORDER — PROPOFOL 10 MG/ML IV BOLUS
INTRAVENOUS | Status: DC | PRN
  Administered 2019-01-02: 20 mg via INTRAVENOUS

## 2019-01-02 MED ORDER — POTASSIUM CHLORIDE CRYS ER 20 MEQ PO TBCR
40.0000 meq | EXTENDED_RELEASE_TABLET | Freq: Once | ORAL | Status: AC
  Administered 2019-01-02: 40 meq via ORAL
  Filled 2019-01-02: qty 2

## 2019-01-02 SURGICAL SUPPLY — 25 items

## 2019-01-02 NOTE — Interval H&P Note (Signed)
History and Physical Interval Note:  01/02/2019 8:06 AM  Emily Cannon  has presented today for surgery, with the diagnosis of anemia, rectal bleeding.  The various methods of treatment have been discussed with the patient and family. After consideration of risks, benefits and other options for treatment, the patient has consented to  Procedure(s): COLONOSCOPY WITH PROPOFOL (N/A) ESOPHAGOGASTRODUODENOSCOPY (EGD) WITH PROPOFOL (N/A) as a surgical intervention.  The patient's history has been reviewed, patient examined, no change in status, stable for surgery.  I have reviewed the patient's chart and labs.  Questions were answered to the patient's satisfaction.     [place]

## 2019-01-02 NOTE — Anesthesia Procedure Notes (Addendum)
Procedure Name: MAC Date/Time: 01/02/2019 8:40 AM Performed by: Inda Coke, CRNA Pre-anesthesia Checklist: Patient identified, Emergency Drugs available, Suction available, Timeout performed and Patient being monitored Patient Re-evaluated:Patient Re-evaluated prior to induction Oxygen Delivery Method: Simple face mask Induction Type: IV induction Dental Injury: Teeth and Oropharynx as per pre-operative assessment

## 2019-01-02 NOTE — Anesthesia Preprocedure Evaluation (Signed)
Anesthesia Evaluation  Patient identified by MRN, date of birth, ID band Patient awake    Reviewed: Allergy & Precautions, NPO status , Patient's Chart, lab work & pertinent test results  History of Anesthesia Complications Negative for: history of anesthetic complications  Airway Mallampati: II  TM Distance: >3 FB Neck ROM: Full    Dental  (+) Missing, Dental Advisory Given   Pulmonary neg pulmonary ROS, neg shortness of breath, neg recent URI,    breath sounds clear to auscultation       Cardiovascular hypertension, Pt. on medications and Pt. on home beta blockers (-) angina(-) Past MI and (-) CHF  Rhythm:Irregular  Neg stress 2017   Neuro/Psych PSYCHIATRIC DISORDERS Anxiety negative neurological ROS     GI/Hepatic GERD  Medicated and Controlled,? GI bleed   Endo/Other  Hypothyroidism   Renal/GU CRFRenal diseaseMicroscopic polyangiitis (renal followng)     Musculoskeletal   Abdominal   Peds  Hematology  (+) Blood dyscrasia, anemia ,   Anesthesia Other Findings   Reproductive/Obstetrics                             Anesthesia Physical Anesthesia Plan  ASA: III  Anesthesia Plan:    Post-op Pain Management:    Induction: Intravenous  PONV Risk Score and Plan: 2 and Propofol infusion and Treatment may vary due to age or medical condition  Airway Management Planned: Nasal Cannula  Additional Equipment: None  Intra-op Plan:   Post-operative Plan:   Informed Consent: I have reviewed the patients History and Physical, chart, labs and discussed the procedure including the risks, benefits and alternatives for the proposed anesthesia with the patient or authorized representative who has indicated his/her understanding and acceptance.     Dental advisory given  Plan Discussed with: CRNA and Surgeon  Anesthesia Plan Comments:         Anesthesia Quick Evaluation

## 2019-01-02 NOTE — Op Note (Signed)
[facility] Patient Name: Emily Cannon Procedure Date : 01/02/2019 MRN: [id number] Attending MD: Carlota Raspberry. Havery Moros , MD Date of Birth: May 17, 1933 CSN: [id number] Age: 85 Admit Type: Inpatient Procedure:                Upper GI endoscopy Indications:              Heme positive stool, recurrent anemia, chronic                            steroid use Providers:                Carlota Raspberry. Havery Moros, MD, Cleda Daub, RN, Marguerita Merles, Technician, Laverda Sorenson, Technician,                            Rejeana Brock, CRNA Referring MD:              Medicines:                Monitored Anesthesia Care Complications:            No immediate complications. Estimated blood loss:                            Minimal. Estimated Blood Loss:     Estimated blood loss was minimal. Procedure:                Pre-Anesthesia Assessment:                           - Prior to the procedure, a History and Physical                            was performed, and patient medications and                            allergies were reviewed. The patient's tolerance of                            previous anesthesia was also reviewed. The risks                            and benefits of the procedure and the sedation                            options and risks were discussed with the patient.                            All questions were answered, and informed consent                            was obtained. Prior Anticoagulants: The patient has                            taken no previous  anticoagulant or antiplatelet                            agents. ASA Grade Assessment: III - A patient with                            severe systemic disease. After reviewing the risks                            and benefits, the patient was deemed in                            satisfactory condition to undergo the procedure.                           After obtaining informed consent, the  endoscope was                            passed under direct vision. Throughout the                            procedure, the patient's blood pressure, pulse, and                            oxygen saturations were monitored continuously. The                            [id number]-[id number] ([id number] Olympus gastroscope was                            introduced through the mouth, and advanced to the                            second part of duodenum. The upper GI endoscopy was                            accomplished without difficulty. The patient                            tolerated the procedure well. Scope In: Scope Out: Findings:      Esophagogastric landmarks were identified: the Z-line was found at 38       cm, the gastroesophageal junction was found at 38 cm and the upper       extent of the gastric folds was found at 38 cm from the incisors.      Diffuse, white plaques were found in the entire esophagus consistent       with esophageal candidiasis.      The exam of the esophagus was otherwise normal.      One non-bleeding cratered clean based gastric ulcer with no stigmata of       bleeding was found in the gastric antrum. The lesion was 4-5 mm in       largest dimension.      The exam of the stomach was otherwise normal.      Biopsies were taken with a  cold forceps in the gastric body, at the       incisura and in the gastric antrum for Helicobacter pylori testing.      The duodenal bulb and second portion of the duodenum were normal. Impression:               - Esophagogastric landmarks identified.                           - Esophageal plaques were found, consistent with                            candidiasis.                           - Non-bleeding gastric ulcer with no stigmata of                            bleeding.                           - Normal duodenal bulb and second portion of the                            duodenum.                           - Biopsies were taken with a  cold forceps for                            Helicobacter pylori testing.                           Suspect gastric ulcer is the cause for patient's                            worsening anemia, no high risk stigmata for                            bleeding, no endoscopic therapy performed. Recommendation:           - Return patient to hospital ward for ongoing care.                           - Resume previous diet.                           - Continue present medications.                           - Start protonix 40mg  PO twice daily for 4 weeks,                            then reduce to once daily                           - If no medication interactions, recommend  fluconazole 400mg  po x 1 and then 200mg  / day for                            another 13 days to treat esophageal candidiasis                           - Await pathology results, treat for H pylori if                            positive                           - Monitor Hgb                           - We will contact the patient with H pylori test                            results and outpatient follow up                           - We will sign off for now, please call with                            questions Procedure Code(s):        --- Professional ---                           [phone], Esophagogastroduodenoscopy, flexible,                            transoral; with biopsy, single or multiple Diagnosis Code(s):        --- Professional ---                           K22.9, Disease of esophagus, unspecified                           K25.9, Gastric ulcer, unspecified as acute or                            chronic, without hemorrhage or perforation                           R19.5, Other fecal abnormalities CPT copyright 2019 American Medical Association. All rights reserved. The codes documented in this report are preliminary and upon coder review may  be revised to meet current compliance  requirements. Remo Lipps P. Armbruster, MD 01/02/2019 9:44:22 AM This report has been signed electronically. Number of Addenda: 0

## 2019-01-02 NOTE — Anesthesia Postprocedure Evaluation (Signed)
Anesthesia Post Note  Patient: Emily Cannon  Procedure(s) Performed: COLONOSCOPY WITH PROPOFOL (N/A ) ESOPHAGOGASTRODUODENOSCOPY (EGD) WITH PROPOFOL (N/A ) BIOPSY     Patient location during evaluation: Endoscopy Anesthesia Type: MAC Level of consciousness: awake and alert Pain management: pain level controlled Vital Signs Assessment: post-procedure vital signs reviewed and stable Respiratory status: spontaneous breathing, nonlabored ventilation, respiratory function stable and patient connected to nasal cannula oxygen Cardiovascular status: stable and blood pressure returned to baseline Postop Assessment: no apparent nausea or vomiting Anesthetic complications: no    Last Vitals:  Vitals:   01/02/19 1000 01/02/19 1037  BP: (!) 142/56 (!) 156/75  Pulse: 67 73  Resp: 14 18  Temp: (!) 36.3 C 36.7 C  SpO2: (!) 87% 95%    Last Pain:  Vitals:   01/02/19 1037  TempSrc: Oral  PainSc:                  Tiesha Marich

## 2019-01-02 NOTE — Op Note (Signed)
[facility] Patient Name: Emily Cannon Procedure Date : 01/02/2019 MRN: [id number] Attending MD: Carlota Raspberry. Havery Moros , MD Date of Birth: 1932/12/19 CSN: [id number] Age: 85 Admit Type: Inpatient Procedure:                Colonoscopy Indications:              Heme positive stool, Rectal bleeding, recurrent                            anemia Providers:                Remo Lipps P. Havery Moros, MD, Cleda Daub, RN, Marguerita Merles, Technician, Laverda Sorenson, Technician,                            Rejeana Brock, CRNA Referring MD:              Medicines:                Monitored Anesthesia Care Complications:            No immediate complications. Estimated blood loss:                            Minimal. Estimated Blood Loss:     Estimated blood loss was minimal. Procedure:                Pre-Anesthesia Assessment:                           - Prior to the procedure, a History and Physical                            was performed, and patient medications and                            allergies were reviewed. The patient's tolerance of                            previous anesthesia was also reviewed. The risks                            and benefits of the procedure and the sedation                            options and risks were discussed with the patient.                            All questions were answered, and informed consent                            was obtained. Prior Anticoagulants: The patient has                            taken no previous anticoagulant or antiplatelet  agents. ASA Grade Assessment: III - A patient with                            severe systemic disease. After reviewing the risks                            and benefits, the patient was deemed in                            satisfactory condition to undergo the procedure.                           After obtaining informed consent, the colonoscope                             was passed under direct vision. Throughout the                            procedure, the patient's blood pressure, pulse, and                            oxygen saturations were monitored continuously. The                            [id number]-[id number] ([id number] Olympus pediatric colonscope                            was introduced through the anus and advanced to the                            the terminal ileum, with identification of the                            appendiceal orifice and IC valve. The colonoscopy                            was performed without difficulty. The patient                            tolerated the procedure well. The quality of the                            bowel preparation was adequate. The terminal ileum,                            ileocecal valve, appendiceal orifice, and rectum                            were photographed. Scope In: 9:07:47 AM Scope Out: 9:30:21 AM Scope Withdrawal Time: 0 hours 19 minutes 18 seconds  Total Procedure Duration: 0 hours 22 minutes 34 seconds  Findings:      The perianal exam findings include a perianal dermatitis (being treated       for zoster)  The terminal ileum appeared normal.      Two sessile polyps were found in the sigmoid colon. The polyps were 3 mm       in size. These polyps were removed with a cold biopsy forceps. Resection       and retrieval were complete.      Internal hemorrhoids were found during retroflexion. The hemorrhoids       were moderate in size and inflamed.      There was liquid stool throughout the colon. Several minutes were spent       lavaging the colon to achieve adequate views. The exam was otherwise       without abnormality. Impression:               - Perianal dermatitis found on perianal exam.                           - The examined portion of the ileum was normal.                           - Two 3 mm polyps in the sigmoid colon, removed                             with a cold biopsy forceps. Resected and retrieved.                           - Internal hemorrhoids with inflammatory changes.                           - Several minutes spent lavaging the colon to                            achieve adequate views.                           - The examination was otherwise normal.                           Inflamed internal hemorrhoids are causing rectal                            bleeding, I suspect ulcer on EGD may be more likely                            the cause of worsening anemia with component of                            hemorrhoidal bleeding. Recommendation:           - Return patient to hospital ward for ongoing care.                           - Resume previous diet.                           - Continue present medications.                           -  Anusol PR daily for one week for internal                            hemorrhoids                           - Await pathology results.                           - Further recommendations per EGD note Procedure Code(s):        --- Professional ---                           [id number], Colonoscopy, flexible; with biopsy, single                            or multiple Diagnosis Code(s):        --- Professional ---                           R21, Rash and other nonspecific skin eruption                           K64.8, Other hemorrhoids                           K63.5, Polyp of colon                           R19.5, Other fecal abnormalities                           K62.5, Hemorrhage of anus and rectum CPT copyright 2019 American Medical Association. All rights reserved. The codes documented in this report are preliminary and upon coder review may  be revised to meet current compliance requirements. Remo Lipps P. Armbruster, MD 01/02/2019 9:38:24 AM This report has been signed electronically. Number of Addenda: 0

## 2019-01-02 NOTE — Transfer of Care (Signed)
Immediate Anesthesia Transfer of Care Note  Patient: Emily Cannon  Procedure(s) Performed: COLONOSCOPY WITH PROPOFOL (N/A ) ESOPHAGOGASTRODUODENOSCOPY (EGD) WITH PROPOFOL (N/A ) BIOPSY  Patient Location: PACU  Anesthesia Type:MAC  Level of Consciousness: awake and drowsy  Airway & Oxygen Therapy: Patient Spontanous Breathing and Patient connected to nasal cannula oxygen  Post-op Assessment: Report given to RN and Post -op Vital signs reviewed and stable  Post vital signs: Reviewed and stable  Last Vitals:  Vitals Value Taken Time  BP 110/34 01/02/2019  9:38 AM  Temp    Pulse 61 01/02/2019  9:39 AM  Resp 16 01/02/2019  9:39 AM  SpO2 91 % 01/02/2019  9:39 AM  Vitals shown include unvalidated device data.  Last Pain:  Vitals:   01/02/19 0742  TempSrc: Oral  PainSc: 0-No pain         Complications: No apparent anesthesia complications

## 2019-01-02 NOTE — Progress Notes (Signed)
PROGRESS NOTE    Emily Cannon  BMW:[id number] DOB: 09-05-1932 DOA: 12/30/2018 PCP: Eulas Post, MD    Brief Narrative:  85 y.o. female with history of chronic kidney disease with microscopic polyangiitis on prednisone, diabetes mellitus, hypertension was recently admitted for symptomatic anemia and at the time of discharge hemoglobin was around 9.2 on Dec 19, 2018 that is around 10 days ago had routine labs done by patient's nephrologist and was found to hemoglobin around 6 and was advised to come to the ER.  Patient has been recently diagnosed with herpes zoster of the buttock area and has been on valacyclovir for 2 weeks by patient's dermatologist.  Her lesions have healed but has had been some skin cracking around the anal area from the previous lesions and hematologist has advised to continue the valacyclovir for 1 more week.  Otherwise denies any chest pain or shortness of breath nausea vomiting or abdominal pain.  ED Course: In the ER patient's creatinine is increased from 2.210 days ago to 2.7.  Hemoglobin 6.6 platelets 186 UA is pending.  On exam patient has bilateral lower extremity edema.  Per patient son was at the bedside patient's Lasix dose was recently increased by nephrologist.  Nephrologist on-call has been consulted patient admitted for acute blood loss anemia.  Stool for occult blood was positive.  Assessment & Plan:   Principal Problem:   Acute blood loss anemia Active Problems:   Essential hypertension   Hypothyroidism   Microscopic polyangiitis (HCC)   AKI (acute kidney injury) (HCC)   Heme positive stool   Benign neoplasm of colon   1. Acute blood loss anemia 1. Earlier required 1 unit of PRBC transfusion this visit 2. GI consulted, appreciate input. 3. Pt now s/p EGD and colonoscopy. 4. Chart reviewed, last colon noted in 2014 with findings of diverticulosis, otherwise unremarkable 2. Acute on chronic disease with microscopic polyangiitis on  prednisone at this time as received rituximab previously.   1. Nephrology consulted and is following 2. Remains stable at this time 3. Repeat bmet in AM 3. Hypothyroidism 1. Continue synthroid as tolerated  2. Presently stable 4. Hypertension 1. Continued on amlodipine metoprolol hydralazine hydrochlorothiazide. 2. BP remains stable currently 5. Hyperlipidemia on statins and gemfibrozil. 1. Presently stable 6. Recently treated herpes zoster of the buttock area.   1. Patient recently saw dermatology advised to continue with valacyclovir for 1 more week. Continue valacyclovir and recommended 1 g daily for more 6 days since admit 2. Rash noted to be in multiple stages of healing throughout multiple dermatomes across bilateral sacral, gluteal regions. Per infection control, no longer need airborne precautions  DVT prophylaxis: SCD's Code Status: Full Family Communication: Pt in room, family not at bedside Disposition Plan: Uncertain at this time  Consultants:   GI  Nephrology  Procedures:   EGD  Colonoscopy  Antimicrobials: Anti-infectives (From admission, onward)   Start     Dose/Rate Route Frequency Ordered Stop   12/31/18 1000  dapsone tablet 100 mg  Status:  Discontinued     100 mg Oral  Every morning - 10a 12/30/18 2206 01/01/19 0859   12/31/18 1000  valACYclovir (VALTREX) tablet 1,000 mg     1,000 mg Oral Daily 12/31/18 0105 01/06/19 0959      Subjective: No complaints at this time  Objective: Vitals:   01/02/19 0940 01/02/19 0950 01/02/19 1000 01/02/19 1037  BP:  (!) 136/48 (!) 142/56 (!) 156/75  Pulse: 61 70 67 73  Resp:  16 17 14 18   Temp:   (!) 97.3 F (36.3 C) 98.1 F (36.7 C)  TempSrc:   Temporal Oral  SpO2: 91% 92% (!) 87% 95%  Weight:      Height:        Intake/Output Summary (Last 24 hours) at 01/02/2019 1337 Last data filed at 01/02/2019 0930 Gross per 24 hour  Intake 919.69 ml  Output 901 ml  Net 18.69 ml   Filed Weights   12/31/18 0149  01/01/19 0646  Weight: 57 kg 54.9 kg    Examination: General exam: Conversant, in no acute distress Respiratory system: normal chest rise, clear, no audible wheezing Cardiovascular system: regular rhythm, s1-s2 Gastrointestinal system: Nondistended, nontender, pos BS Central nervous system: No seizures, no tremors Extremities: No cyanosis, no joint deformities Skin: No rashes, no pallor Psychiatry: Affect normal // no auditory hallucinations   Data Reviewed: I have personally reviewed following labs and imaging studies  CBC: Recent Labs  Lab 12/30/18 1845 12/31/18 0405 01/01/19 0613 01/02/19 0500  WBC 6.2 6.8 8.4 7.7  HGB 6.6* 7.6* 9.1* 8.8*  HCT 21.8* 22.6* 27.6* 26.8*  MCV 106.9* 95.0 95.8 96.4  PLT 186 155 187 559   Basic Metabolic Panel: Recent Labs  Lab 12/30/18 1845 12/31/18 0405 01/01/19 0613 01/02/19 0500  NA 132* 136 137 141  K 4.2 3.7 3.6 2.9*  CL 100 105 102 105  CO2 19* 22 21* 22  GLUCOSE 232* 78 117* 143*  BUN 53* 49* 50* 37*  CREATININE 2.77* 2.61* 2.72* 2.30*  CALCIUM 8.8* 8.6* 8.8* 8.6*  PHOS  --   --  4.1 4.6   GFR: Estimated Creatinine Clearance: 14.4 mL/min (A) (by C-G formula based on SCr of 2.3 mg/dL (H)). Liver Function Tests: Recent Labs  Lab 01/01/19 0613 01/02/19 0500  ALBUMIN 2.9* 2.6*   No results for input(s): LIPASE, AMYLASE in the last 168 hours. No results for input(s): AMMONIA in the last 168 hours. Coagulation Profile: No results for input(s): INR, PROTIME in the last 168 hours. Cardiac Enzymes: No results for input(s): CKTOTAL, CKMB, CKMBINDEX, TROPONINI in the last 168 hours. BNP (last 3 results) No results for input(s): PROBNP in the last 8760 hours. HbA1C: No results for input(s): HGBA1C in the last 72 hours. CBG: Recent Labs  Lab 01/01/19 0831 01/01/19 1221 01/01/19 1722 01/01/19 2159 01/02/19 1200  GLUCAP 76 139* 236* 158* 94   Lipid Profile: No results for input(s): CHOL, HDL, LDLCALC, TRIG, CHOLHDL,  LDLDIRECT in the last 72 hours. Thyroid Function Tests: No results for input(s): TSH, T4TOTAL, FREET4, T3FREE, THYROIDAB in the last 72 hours. Anemia Panel: No results for input(s): VITAMINB12, FOLATE, FERRITIN, TIBC, IRON, RETICCTPCT in the last 72 hours. Sepsis Labs: No results for input(s): PROCALCITON, LATICACIDVEN in the last 168 hours.  Recent Results (from the past 240 hour(s))  SARS Coronavirus 2 (CEPHEID - Performed in [facility] hospital lab), Hosp Order     Status: None   Collection Time: 12/30/18  9:25 PM  Result Value Ref Range Status   SARS Coronavirus 2 NEGATIVE NEGATIVE Final    Comment: (NOTE) If result is NEGATIVE SARS-CoV-2 target nucleic acids are NOT DETECTED. The SARS-CoV-2 RNA is generally detectable in upper and lower  respiratory specimens during the acute phase of infection. The lowest  concentration of SARS-CoV-2 viral copies this assay can detect is 250  copies / mL. A negative result does not preclude SARS-CoV-2 infection  and should not be used as the sole basis  for treatment or other  patient management decisions.  A negative result may occur with  improper specimen collection / handling, submission of specimen other  than nasopharyngeal swab, presence of viral mutation(s) within the  areas targeted by this assay, and inadequate number of viral copies  (<250 copies / mL). A negative result must be combined with clinical  observations, patient history, and epidemiological information. If result is POSITIVE SARS-CoV-2 target nucleic acids are DETECTED. The SARS-CoV-2 RNA is generally detectable in upper and lower  respiratory specimens dur ing the acute phase of infection.  Positive  results are indicative of active infection with SARS-CoV-2.  Clinical  correlation with patient history and other diagnostic information is  necessary to determine patient infection status.  Positive results do  not rule out bacterial infection or co-infection with other  viruses. If result is PRESUMPTIVE POSTIVE SARS-CoV-2 nucleic acids MAY BE PRESENT.   A presumptive positive result was obtained on the submitted specimen  and confirmed on repeat testing.  While 2019 novel coronavirus  (SARS-CoV-2) nucleic acids may be present in the submitted sample  additional confirmatory testing may be necessary for epidemiological  and / or clinical management purposes  to differentiate between  SARS-CoV-2 and other Sarbecovirus currently known to infect humans.  If clinically indicated additional testing with an alternate test  methodology [phone]) is advised. The SARS-CoV-2 RNA is generally  detectable in upper and lower respiratory sp ecimens during the acute  phase of infection. The expected result is Negative. Fact Sheet for Patients:  StrictlyIdeas.no Fact Sheet for Healthcare Providers: BankingDealers.co.za This test is not yet approved or cleared by the [place] FDA and has been authorized for detection and/or diagnosis of SARS-CoV-2 by FDA under an Emergency Use Authorization (EUA).  This EUA will remain in effect (meaning this test can be used) for the duration of the COVID-19 declaration under Section 564(b)(1) of the Act, 21 U.S.C. section 360bbb-3(b)(1), unless the authorization is terminated or revoked sooner. Performed at [facility], [place] [address], [place], [place] [address]      Radiology Studies: No results found.  Scheduled Meds: . amLODipine  10 mg Oral Daily  . atorvastatin  20 mg Oral Daily  . calcitRIOL  0.25 mcg Oral Q M,W,F  . darbepoetin (ARANESP) injection - NON-DIALYSIS  100 mcg Subcutaneous Q Thu-1800  . furosemide  40 mg Oral Daily  . gemfibrozil  600 mg Oral q morning - 10a  . Gerhardt's butt cream  1 application Topical TID  . hydrALAZINE  50 mg Oral TID  . hydrochlorothiazide  12.5 mg Oral Daily  . hydrocortisone  25 mg Rectal BID  . insulin aspart  0-9 Units  Subcutaneous TID WC  . levothyroxine  50 mcg Oral QAC breakfast  . magnesium oxide  400 mg Oral Q breakfast  . metoprolol succinate  50 mg Oral BID  . multivitamin with minerals  1 tablet Oral Q breakfast  . pantoprazole  40 mg Oral BID  . predniSONE  60 mg Oral QAC breakfast  . valACYclovir  1,000 mg Oral Daily   Continuous Infusions: . sodium chloride 10 mL/hr at 12/31/18 1608  . dextrose 5 % and 0.9% NaCl 50 mL/hr at 01/02/19 1226  . ferumoxytol 510 mg (12/31/18 1609)     LOS: 2 days   Marylu Lund, MD [facility] Pager On Amion  If 7PM-7AM, please contact night-coverage 01/02/2019, 1:37 PM

## 2019-01-03 ENCOUNTER — Encounter (HOSPITAL_COMMUNITY): Payer: Self-pay | Admitting: Gastroenterology

## 2019-01-03 LAB — CBC
HCT: 25 % — ABNORMAL LOW (ref 36.0–46.0)
Hemoglobin: 8.1 g/dL — ABNORMAL LOW (ref 12.0–15.0)
MCH: 32 pg (ref 26.0–34.0)
MCHC: 32.4 g/dL (ref 30.0–36.0)
MCV: 98.8 fL (ref 80.0–100.0)
Platelets: 168 10*3/uL (ref 150–400)
RBC: 2.53 MIL/uL — ABNORMAL LOW (ref 3.87–5.11)
RDW: 22.3 % — ABNORMAL HIGH (ref 11.5–15.5)
WBC: 7.4 10*3/uL (ref 4.0–10.5)
nRBC: 5.4 % — ABNORMAL HIGH (ref 0.0–0.2)

## 2019-01-03 LAB — RENAL FUNCTION PANEL
Albumin: 2.5 g/dL — ABNORMAL LOW (ref 3.5–5.0)
Anion gap: 7 (ref 5–15)
BUN: 37 mg/dL — ABNORMAL HIGH (ref 8–23)
CO2: 23 mmol/L (ref 22–32)
Calcium: 8.6 mg/dL — ABNORMAL LOW (ref 8.9–10.3)
Chloride: 110 mmol/L (ref 98–111)
Creatinine, Ser: 2.25 mg/dL — ABNORMAL HIGH (ref 0.44–1.00)
GFR calc Af Amer: 23 mL/min — ABNORMAL LOW (ref >60)
GFR calc non Af Amer: 20 mL/min — ABNORMAL LOW (ref >60)
Glucose, Bld: 104 mg/dL — ABNORMAL HIGH (ref 70–99)
Phosphorus: 2.9 mg/dL (ref 2.5–4.6)
Potassium: 3.8 mmol/L (ref 3.5–5.1)
Sodium: 140 mmol/L (ref 135–145)

## 2019-01-03 LAB — GLUCOSE, CAPILLARY: Glucose-Capillary: 100 mg/dL — ABNORMAL HIGH (ref 70–99)

## 2019-01-03 MED ORDER — FLUCONAZOLE 100 MG PO TABS
400.0000 mg | ORAL_TABLET | Freq: Once | ORAL | Status: AC
  Administered 2019-01-03: 400 mg via ORAL
  Filled 2019-01-03: qty 4

## 2019-01-03 MED ORDER — PANTOPRAZOLE SODIUM 40 MG PO TBEC: 40.0000 mg | DELAYED_RELEASE_TABLET | Freq: Two times a day (BID) | ORAL | 0 refills | Status: DC

## 2019-01-03 MED ORDER — PANTOPRAZOLE SODIUM 40 MG PO TBEC: 40.0000 mg | DELAYED_RELEASE_TABLET | Freq: Every day | ORAL | 0 refills | Status: DC

## 2019-01-03 MED ORDER — VALACYCLOVIR HCL 1 G PO TABS: 1000.0000 mg | ORAL_TABLET | Freq: Every day | ORAL | 0 refills | Status: AC

## 2019-01-03 MED ORDER — FLUCONAZOLE 100 MG PO TABS: 200.0000 mg | ORAL_TABLET | Freq: Every day | ORAL | 0 refills | Status: AC

## 2019-01-03 MED ORDER — HYDROCORTISONE ACETATE 25 MG RE SUPP: 25.0000 mg | Freq: Two times a day (BID) | RECTAL | 0 refills | Status: AC

## 2019-01-03 NOTE — Plan of Care (Signed)
  Problem: Education: Goal: Knowledge of General Education information will improve Description Including pain rating scale, medication(s)/side effects and non-pharmacologic comfort measures Outcome: Progressing   Problem: Health Behavior/Discharge Planning: Goal: Ability to manage health-related needs will improve Outcome: Progressing   Problem: Clinical Measurements: Goal: Diagnostic test results will improve Outcome: Progressing   Problem: Pain Managment: Goal: General experience of comfort will improve Outcome: Progressing   

## 2019-01-03 NOTE — Discharge Summary (Signed)
Physician Discharge Summary  ASERET HOFFMAN GUY:[id number] DOB: 1933/01/30 DOA: 12/30/2018  PCP: Eulas Post, MD  Admit date: 12/30/2018 Discharge date: 01/03/2019  Admitted From: Home Disposition:  Home  Recommendations for Outpatient Follow-up:  1. Follow up with PCP in 2-3 weeks  Discharge Condition:Stable CODE STATUS:Full Diet recommendation: Regular   Brief/Interim Summary: 85 y.o.femalewithhistory of chronic kidney disease with microscopic polyangiitis on prednisone, diabetes mellitus, hypertension was recently admitted for symptomatic anemia and at the time of discharge hemoglobin was around 9.2 on Dec 19, 2018 that is around 10 days ago had routine labs done by patient's nephrologist and was found to hemoglobin around 6 and was advised to come to the ER. Patient has been recently diagnosed with herpes zoster of the buttock area and has been on valacyclovir for 2 weeks by patient's dermatologist. Her lesions have healed but has had been some skin cracking around the anal area from the previous lesions and hematologist has advised to continue the valacyclovir for 1 more week. Otherwise denies any chest pain or shortness of breath nausea vomiting or abdominal pain.  ED Course:In the ER patient's creatinine is increased from 2.210 days ago to 2.7. Hemoglobin 6.6 platelets 186 UA is pending. On exam patient has bilateral lower extremity edema. Per patient son was at the bedside patient's Lasix dose was recently increased by nephrologist. Nephrologist on-call has been consulted patient admitted for acute blood loss anemia. Stool for occult blood was positive.  Discharge Diagnoses:  Principal Problem:   Acute blood loss anemia Active Problems:   Essential hypertension   Hypothyroidism   Microscopic polyangiitis (HCC)   AKI (acute kidney injury) (HCC)   Heme positive stool   Benign neoplasm of colon  1. Acute blood loss anemia 1. Earlier required 1 unit of PRBC  transfusion this visit 2. GI consulted, appreciate input. 3. Pt now s/p EGD and colonoscopy. Findings consistent with non-bleeding gastric ulcer and evidence of esophageal candidiasis 4. GI recommendation for 4 weeks of BID PPI followed by once daily. Also total 14 days of fluconazole 5. Chart reviewed, last colon noted in 2014 with findings of diverticulosis, otherwise unremarkable 2. Acute on chronic disease with microscopic polyangiitis on prednisone at this time as received rituximab previously.  1. Nephrology consulted 2. Remains stable at this time 3. Hypothyroidism 1. Continue synthroid as tolerated  2. Presently stable 4. Hypertension 1. Continued on amlodipine metoprolol hydralazine hydrochlorothiazide. 2. BP remains stable currently 5. Hyperlipidemia on statins and gemfibrozil. 1. Presently stable 6. Recently treated herpes zoster of the buttock area.  1. Patient recently saw dermatology advised to continue with valacyclovir for 1 more week. Continue valacyclovir as tolerated, 4 more days on discharge   Discharge Instructions   Allergies as of 01/03/2019      Reactions   Alendronate Sodium Shortness Of Breath   Amoxicillin Diarrhea   Severe diarrhea   Codeine Nausea Only   Valsartan Swelling   Gums/throat swell      Medication List    STOP taking these medications   glucose blood test strip Commonly known as:  Press photographer w/Device Kit   potassium chloride SA 20 MEQ tablet Commonly known as:  K-DUR     TAKE these medications   amLODipine 10 MG tablet Commonly known as:  NORVASC TAKE ONE TABLET BY MOUTH DAILY   atorvastatin 20 MG tablet Commonly known as:  LIPITOR TAKE ONE TABLET BY MOUTH DAILY   calcitRIOL 0.25 MCG capsule Commonly known  as:  ROCALTROL Take 0.25 mcg by mouth every Monday, Wednesday, and Friday.   Centrum Silver 50+Women Tabs Take 1 tablet by mouth daily with breakfast.   dapsone 100 MG tablet Take 100 mg by  mouth every morning.   fluconazole 100 MG tablet Commonly known as:  Diflucan Take 2 tablets (200 mg total) by mouth daily for 13 days. Start taking on:  January 04, 2019   furosemide 40 MG tablet Commonly known as:  LASIX Take 0.5 tablets (20 mg total) by mouth 3 (three) times a week. Monday, Wed, Friday What changed:    how much to take  when to take this  additional instructions   gemfibrozil 600 MG tablet Commonly known as:  LOPID TAKE ONE TABLET BY MOUTH DAILY What changed:  when to take this   Gerhardt's butt cream Crea Apply 1 application topically 3 (three) times daily. What changed:  additional instructions   glipiZIDE 5 MG tablet Commonly known as:  GLUCOTROL Take 2.5 mg by mouth daily before breakfast.   hydrALAZINE 50 MG tablet Commonly known as:  APRESOLINE Take 1 tablet (50 mg total) by mouth 3 (three) times daily.   hydrochlorothiazide 12.5 MG capsule Commonly known as:  MICROZIDE Take 12.5 mg by mouth daily.   hydrocortisone 25 MG suppository Commonly known as:  ANUSOL-HC Place 1 suppository (25 mg total) rectally 2 (two) times daily for 30 days.   levothyroxine 50 MCG tablet Commonly known as:  SYNTHROID TAKE ONE TABLET BY MOUTH EVERY MORNING BEFORE BREAKFAST What changed:  when to take this   magnesium oxide 400 MG tablet Commonly known as:  MAG-OX Take 400 mg by mouth daily with breakfast.   metoprolol succinate 50 MG 24 hr tablet Commonly known as:  TOPROL-XL TAKE ONE TABLET BY MOUTH TWICE A DAY What changed:  when to take this   pantoprazole 40 MG tablet Commonly known as:  PROTONIX Take 1 tablet (40 mg total) by mouth 2 (two) times daily for 28 days. What changed:    medication strength  how much to take  when to take this   pantoprazole 40 MG tablet Commonly known as:  Protonix Take 1 tablet (40 mg total) by mouth daily for 30 days. Start after twice daily dosing What changed:  You were already taking a medication with the same  name, and this prescription was added. Make sure you understand how and when to take each.   predniSONE 20 MG tablet Commonly known as:  DELTASONE Take 3 tablets (60 mg total) by mouth daily before breakfast.   valACYclovir 1000 MG tablet Commonly known as:  VALTREX Take 1 tablet (1,000 mg total) by mouth daily for 4 days. Start taking on:  January 04, 2019   VITAMIN D-3 PO Take 1 capsule by mouth every Monday, Wednesday, and Friday.       Allergies  Allergen Reactions  . Alendronate Sodium Shortness Of Breath  . Amoxicillin Diarrhea    Severe diarrhea  . Codeine Nausea Only  . Valsartan Swelling    Gums/throat swell    Consultations:  GI  Procedures/Studies: US Renal  Result Date: 12/15/2018 CLINICAL DATA:  85 year old female with acute renal failure EXAM: RENAL / URINARY TRACT ULTRASOUND COMPLETE COMPARISON:  None. FINDINGS: Right Kidney: Length: 10.6 cm x 4.6 cm x 4.8 cm, 122 cc. Echogenicity within normal limits. No mass or hydronephrosis visualized. Left Kidney: Length: 11.7 cm x 4.9 cm x 5.5 cm, 164 cc. Echogenicity within normal limits. No mass  or hydronephrosis visualized. Bladder: Appears normal for degree of bladder distention. IMPRESSION: Negative for hydronephrosis. Unremarkable echogenicity of the kidney parenchyma. Electronically Signed   By: Corrie Mckusick D.O.   On: 12/15/2018 09:03   Dg Chest Portable 1 View  Result Date: 12/30/2018 CLINICAL DATA:  Week with shortness of breath. Hypertension. Acute kidney injury. EXAM: PORTABLE CHEST 1 VIEW COMPARISON:  12/15/2018. FINDINGS: Heart size upper limits normal. No consolidation or edema. Calcified tortuous aorta. No effusion or pneumothorax. Improved cardiac size from priors. IMPRESSION: No active disease. Electronically Signed   By: Staci Righter M.D.   On: 12/30/2018 21:29   Dg Chest Portable 1 View  Result Date: 12/15/2018 CLINICAL DATA:  Microscopic polyangiitis. Bilateral lower extremity swelling. Hypertension.  EXAM: PORTABLE CHEST 1 VIEW COMPARISON:  CT chest dated 10/22/2018 FINDINGS: The heart is enlarged. There are few linear opacities at the lung bases. No pneumothorax. No large pleural effusion. No acute osseous abnormality. There is an old healed left clavicle fracture. IMPRESSION: 1. Mild stable cardiomegaly. 2. No acute cardiopulmonary process. Previously noted ground-glass airspace opacities are better visualized on prior CT Electronically Signed   By: Constance Holster M.D.   On: 12/15/2018 16:45    Subjective: Eager to go home  Discharge Exam: Vitals:   01/02/19 2327 01/03/19 0831  BP: (!) 145/62 (!) 176/63  Pulse:  70  Resp: 17 14  Temp: 98.3 F (36.8 C) 97.9 F (36.6 C)  SpO2: 96% 93%   Vitals:   01/02/19 1037 01/02/19 1609 01/02/19 2327 01/03/19 0831  BP: (!) 156/75 (!) 136/58 (!) 145/62 (!) 176/63  Pulse: 73 80  70  Resp: _0 Temp: 98.1 F (36.7 C) 98.7 F (37.1 C) 98.3 F (36.8 C) 97.9 F (36.6 C)  TempSrc: Oral  Oral Oral  SpO2: 95% 92% 96% 93%  Weight:      Height:        General: Pt is alert, awake, not in acute distress Cardiovascular: RRR, S1/S2 +, no rubs, no gallops Respiratory: CTA bilaterally, no wheezing, no rhonchi Abdominal: Soft, NT, ND, bowel sounds + Extremities: no edema, no cyanosis   The results of significant diagnostics from this hospitalization (including imaging, microbiology, ancillary and laboratory) are listed below for reference.     Microbiology: Recent Results (from the past 240 hour(s))  SARS Coronavirus 2 (CEPHEID - Performed in [place] hospital lab), Hosp Order     Status: None   Collection Time: 12/30/18  9:25 PM  Result Value Ref Range Status   SARS Coronavirus 2 NEGATIVE NEGATIVE Final    Comment: (NOTE) If result is NEGATIVE SARS-CoV-2 target nucleic acids are NOT DETECTED. The SARS-CoV-2 RNA is generally detectable in upper and lower  respiratory specimens during the acute phase of infection. The lowest   concentration of SARS-CoV-2 viral copies this assay can detect is 250  copies / mL. A negative result does not preclude SARS-CoV-2 infection  and should not be used as the sole basis for treatment or other  patient management decisions.  A negative result may occur with  improper specimen collection / handling, submission of specimen other  than nasopharyngeal swab, presence of viral mutation(s) within the  areas targeted by this assay, and inadequate number of viral copies  (<250 copies / mL). A negative result must be combined with clinical  observations, patient history, and epidemiological information. If result is POSITIVE SARS-CoV-2 target nucleic acids are DETECTED. The SARS-CoV-2 RNA is generally detectable in upper  and lower  respiratory specimens dur ing the acute phase of infection.  Positive  results are indicative of active infection with SARS-CoV-2.  Clinical  correlation with patient history and other diagnostic information is  necessary to determine patient infection status.  Positive results do  not rule out bacterial infection or co-infection with other viruses. If result is PRESUMPTIVE POSTIVE SARS-CoV-2 nucleic acids MAY BE PRESENT.   A presumptive positive result was obtained on the submitted specimen  and confirmed on repeat testing.  While 2019 novel coronavirus  (SARS-CoV-2) nucleic acids may be present in the submitted sample  additional confirmatory testing may be necessary for epidemiological  and / or clinical management purposes  to differentiate between  SARS-CoV-2 and other Sarbecovirus currently known to infect humans.  If clinically indicated additional testing with an alternate test  methodology [phone]) is advised. The SARS-CoV-2 RNA is generally  detectable in upper and lower respiratory sp ecimens during the acute  phase of infection. The expected result is Negative. Fact Sheet for Patients:  StrictlyIdeas.no Fact Sheet  for Healthcare Providers: BankingDealers.co.za This test is not yet approved or cleared by the [place] FDA and has been authorized for detection and/or diagnosis of SARS-CoV-2 by FDA under an Emergency Use Authorization (EUA).  This EUA will remain in effect (meaning this test can be used) for the duration of the COVID-19 declaration under Section 564(b)(1) of the Act, 21 U.S.C. section 360bbb-3(b)(1), unless the authorization is terminated or revoked sooner. Performed at [facility], [place] [address] [place], [place] [address]      Labs: BNP (last 3 results) No results for input(s): BNP in the last 8760 hours. Basic Metabolic Panel: Recent Labs  Lab 12/30/18 1845 12/31/18 0405 01/01/19 0613 01/02/19 0500 01/03/19 0737  NA 132* 136 137 141 140  K 4.2 3.7 3.6 2.9* 3.8  CL 100 105 102 105 110  CO2 19* 22 21* 22 23  GLUCOSE 232* 78 117* 143* 104*  BUN 53* 49* 50* 37* 37*  CREATININE 2.77* 2.61* 2.72* 2.30* 2.25*  CALCIUM 8.8* 8.6* 8.8* 8.6* 8.6*  PHOS  --   --  4.1 4.6 2.9   Liver Function Tests: Recent Labs  Lab 01/01/19 0613 01/02/19 0500 01/03/19 0737  ALBUMIN 2.9* 2.6* 2.5*   No results for input(s): LIPASE, AMYLASE in the last 168 hours. No results for input(s): AMMONIA in the last 168 hours. CBC: Recent Labs  Lab 12/30/18 1845 12/31/18 0405 01/01/19 0613 01/02/19 0500 01/03/19 0737  WBC 6.2 6.8 8.4 7.7 7.4  HGB 6.6* 7.6* 9.1* 8.8* 8.1*  HCT 21.8* 22.6* 27.6* 26.8* 25.0*  MCV 106.9* 95.0 95.8 96.4 98.8  PLT 186 155 187 182 168   Cardiac Enzymes: No results for input(s): CKTOTAL, CKMB, CKMBINDEX, TROPONINI in the last 168 hours. BNP: Invalid input(s): POCBNP CBG: Recent Labs  Lab 01/01/19 2159 01/02/19 1200 01/02/19 1604 01/02/19 2133 01/03/19 0832  GLUCAP 158* 94 286* 239* 100*   D-Dimer No results for input(s): DDIMER in the last 72 hours. Hgb A1c No results for input(s): HGBA1C in the last 72  hours. Lipid Profile No results for input(s): CHOL, HDL, LDLCALC, TRIG, CHOLHDL, LDLDIRECT in the last 72 hours. Thyroid function studies No results for input(s): TSH, T4TOTAL, T3FREE, THYROIDAB in the last 72 hours.  Invalid input(s): FREET3 Anemia work up No results for input(s): VITAMINB12, FOLATE, FERRITIN, TIBC, IRON, RETICCTPCT in the last 72 hours. Urinalysis    Component Value Date/Time   COLORURINE YELLOW  12/17/2018 1500   APPEARANCEUR TURBID (A) 12/17/2018 1500   LABSPEC 1.009 12/17/2018 1500   PHURINE 6.0 12/17/2018 1500   GLUCOSEU 50 (A) 12/17/2018 1500   GLUCOSEU NEGATIVE 10/07/2018 1228   HGBUR LARGE (A) 12/17/2018 1500   BILIRUBINUR NEGATIVE 12/17/2018 1500   KETONESUR NEGATIVE 12/17/2018 1500   PROTEINUR 100 (A) 12/17/2018 1500   UROBILINOGEN 0.2 10/07/2018 1228   NITRITE NEGATIVE 12/17/2018 1500   LEUKOCYTESUR LARGE (A) 12/17/2018 1500   Sepsis Labs Invalid input(s): PROCALCITONIN,  WBC,  LACTICIDVEN Microbiology Recent Results (from the past 240 hour(s))  SARS Coronavirus 2 (CEPHEID - Performed in [place] hospital lab), Hosp Order     Status: None   Collection Time: 12/30/18  9:25 PM  Result Value Ref Range Status   SARS Coronavirus 2 NEGATIVE NEGATIVE Final    Comment: (NOTE) If result is NEGATIVE SARS-CoV-2 target nucleic acids are NOT DETECTED. The SARS-CoV-2 RNA is generally detectable in upper and lower  respiratory specimens during the acute phase of infection. The lowest  concentration of SARS-CoV-2 viral copies this assay can detect is 250  copies / mL. A negative result does not preclude SARS-CoV-2 infection  and should not be used as the sole basis for treatment or other  patient management decisions.  A negative result may occur with  improper specimen collection / handling, submission of specimen other  than nasopharyngeal swab, presence of viral mutation(s) within the  areas targeted by this assay, and inadequate number of viral copies   (<250 copies / mL). A negative result must be combined with clinical  observations, patient history, and epidemiological information. If result is POSITIVE SARS-CoV-2 target nucleic acids are DETECTED. The SARS-CoV-2 RNA is generally detectable in upper and lower  respiratory specimens dur ing the acute phase of infection.  Positive  results are indicative of active infection with SARS-CoV-2.  Clinical  correlation with patient history and other diagnostic information is  necessary to determine patient infection status.  Positive results do  not rule out bacterial infection or co-infection with other viruses. If result is PRESUMPTIVE POSTIVE SARS-CoV-2 nucleic acids MAY BE PRESENT.   A presumptive positive result was obtained on the submitted specimen  and confirmed on repeat testing.  While 2019 novel coronavirus  (SARS-CoV-2) nucleic acids may be present in the submitted sample  additional confirmatory testing may be necessary for epidemiological  and / or clinical management purposes  to differentiate between  SARS-CoV-2 and other Sarbecovirus currently known to infect humans.  If clinically indicated additional testing with an alternate test  methodology [phone]) is advised. The SARS-CoV-2 RNA is generally  detectable in upper and lower respiratory sp ecimens during the acute  phase of infection. The expected result is Negative. Fact Sheet for Patients:  StrictlyIdeas.no Fact Sheet for Healthcare Providers: BankingDealers.co.za This test is not yet approved or cleared by the [place] FDA and has been authorized for detection and/or diagnosis of SARS-CoV-2 by FDA under an Emergency Use Authorization (EUA).  This EUA will remain in effect (meaning this test can be used) for the duration of the COVID-19 declaration under Section 564(b)(1) of the Act, 21 U.S.C. section 360bbb-3(b)(1), unless the authorization is terminated  or revoked sooner. Performed at [facility], [place] [address], [place], [place] [address]    Time spent: 69mn  SIGNED:   SMarylu Lund MD  [facility] 01/03/2019, 10:17 AM  If 7PM-7AM, please contact night-coverage

## 2019-01-04 ENCOUNTER — Telehealth: Payer: Self-pay | Admitting: *Deleted

## 2019-01-04 LAB — GLUCOSE, CAPILLARY: Glucose-Capillary: 109 mg/dL — ABNORMAL HIGH (ref 70–99)

## 2019-01-04 LAB — PATHOLOGIST SMEAR REVIEW

## 2019-01-04 NOTE — Telephone Encounter (Signed)
Transition Care Management Follow-up Telephone Call   Date discharged? 01/03/2019   How have you been since you were released from the hospital? Doing better has more energy    Do you understand why you were in the hospital? yes   Do you understand the discharge instructions? yes   Where were you discharged to? Home    Items Reviewed:  Medications reviewed: yes  Allergies reviewed: yes  Dietary changes reviewed: N/A  Referrals reviewed: N/A   Functional Questionnaire:   Activities of Daily Living (ADLs):   She states they are independent in the following: feeding States they require assistance with the following: ambulation, bathing and hygiene, continence, grooming, toileting and dressing   Any transportation issues/concerns?: no   Any patient concerns? no   Confirmed importance and date/time of follow-up visits scheduled yes  Provider Appointment booked with January 15 2019 at 1:30 PM with Dr. Elease Hashimoto   Confirmed with patient if condition begins to worsen call PCP or go to the ER.  Patient was given the office number and encouraged to call back with question or concerns.  : yes

## 2019-01-05 ENCOUNTER — Other Ambulatory Visit: Payer: Self-pay

## 2019-01-05 ENCOUNTER — Telehealth: Payer: Self-pay | Admitting: Gastroenterology

## 2019-01-05 DIAGNOSIS — E039 Hypothyroidism, unspecified: Secondary | ICD-10-CM | POA: Diagnosis not present

## 2019-01-05 DIAGNOSIS — I10 Essential (primary) hypertension: Secondary | ICD-10-CM | POA: Diagnosis not present

## 2019-01-05 DIAGNOSIS — M317 Microscopic polyangiitis: Secondary | ICD-10-CM | POA: Diagnosis not present

## 2019-01-05 DIAGNOSIS — I872 Venous insufficiency (chronic) (peripheral): Secondary | ICD-10-CM | POA: Diagnosis not present

## 2019-01-05 DIAGNOSIS — D649 Anemia, unspecified: Secondary | ICD-10-CM | POA: Diagnosis not present

## 2019-01-05 DIAGNOSIS — J309 Allergic rhinitis, unspecified: Secondary | ICD-10-CM | POA: Diagnosis not present

## 2019-01-05 DIAGNOSIS — M545 Low back pain: Secondary | ICD-10-CM | POA: Diagnosis not present

## 2019-01-05 DIAGNOSIS — T380X5D Adverse effect of glucocorticoids and synthetic analogues, subsequent encounter: Secondary | ICD-10-CM | POA: Diagnosis not present

## 2019-01-05 DIAGNOSIS — E099 Drug or chemical induced diabetes mellitus without complications: Secondary | ICD-10-CM | POA: Diagnosis not present

## 2019-01-05 DIAGNOSIS — N179 Acute kidney failure, unspecified: Secondary | ICD-10-CM

## 2019-01-05 DIAGNOSIS — M8580 Other specified disorders of bone density and structure, unspecified site: Secondary | ICD-10-CM | POA: Diagnosis not present

## 2019-01-05 DIAGNOSIS — K589 Irritable bowel syndrome without diarrhea: Secondary | ICD-10-CM | POA: Diagnosis not present

## 2019-01-05 DIAGNOSIS — E7800 Pure hypercholesterolemia, unspecified: Secondary | ICD-10-CM | POA: Diagnosis not present

## 2019-01-05 NOTE — Telephone Encounter (Signed)
Got a call from pathologist about this patient's pathology results from recent EGD and colonoscopy. She was admitted to the hospital with multiple issues, has had worsening anemia. EGD showed a gastric ulcer as the suspected cause. 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 Vic Ripper, although formal staining due to be back tomorrow to confirm.  She is on high dose prednisone for her rheumatic condition. She was also found to have severe esophageal candidiasis for which she is being treated, and also on valcyclovir for zoster infection. Further she has CKD and poor GFR.  I called Dr. Linus Salmons of ID about options, he is recommending urgent ID consultation at their office in the next 24-48 hours to start her on valgancyclovir. Dosing of this will be tricky given her renal function. She will continue PPI as well as diflucan.     Sherlynn Stalls, can you please let the patient know that biopsies show she is positive for CMV infection, which could be leading to the ulcer in her stomach. Can you refer her to see ID for urgent consult to be seen in the next 1-2 days so they can discuss options and therapy per Dr. Linus Salmons, their pharmacy is going to go over oral valgancyclovir with her. Thanks

## 2019-01-05 NOTE — Telephone Encounter (Signed)
Great thanks so much I appreciate your help coordinating this so quickly

## 2019-01-05 NOTE — Telephone Encounter (Signed)
Patient referred to I.D. 01/06/19 at 2:00pm patient will see Dr. Wilder Glade at [facility] for I.D. [address] Referral in computer and spoke with Joycelyn Schmid. Called and spoke with patient's husband, good with referral appt.

## 2019-01-06 ENCOUNTER — Other Ambulatory Visit: Payer: Self-pay

## 2019-01-06 ENCOUNTER — Encounter: Payer: Self-pay | Admitting: Internal Medicine

## 2019-01-06 ENCOUNTER — Ambulatory Visit (INDEPENDENT_AMBULATORY_CARE_PROVIDER_SITE_OTHER): Payer: Medicare Other | Admitting: Internal Medicine

## 2019-01-06 ENCOUNTER — Ambulatory Visit: Payer: Medicare Other | Admitting: Infectious Disease

## 2019-01-06 ENCOUNTER — Other Ambulatory Visit: Payer: Self-pay | Admitting: Family Medicine

## 2019-01-06 VITALS — BP 136/61 | HR 79 | Temp 98.0°F | Ht 61.0 in

## 2019-01-06 DIAGNOSIS — I129 Hypertensive chronic kidney disease with stage 1 through stage 4 chronic kidney disease, or unspecified chronic kidney disease: Secondary | ICD-10-CM | POA: Diagnosis not present

## 2019-01-06 DIAGNOSIS — N179 Acute kidney failure, unspecified: Secondary | ICD-10-CM | POA: Diagnosis not present

## 2019-01-06 DIAGNOSIS — R801 Persistent proteinuria, unspecified: Secondary | ICD-10-CM | POA: Diagnosis not present

## 2019-01-06 DIAGNOSIS — Z8619 Personal history of other infectious and parasitic diseases: Secondary | ICD-10-CM

## 2019-01-06 DIAGNOSIS — D649 Anemia, unspecified: Secondary | ICD-10-CM | POA: Diagnosis not present

## 2019-01-06 DIAGNOSIS — B259 Cytomegaloviral disease, unspecified: Secondary | ICD-10-CM

## 2019-01-06 DIAGNOSIS — N184 Chronic kidney disease, stage 4 (severe): Secondary | ICD-10-CM | POA: Diagnosis not present

## 2019-01-06 DIAGNOSIS — N2581 Secondary hyperparathyroidism of renal origin: Secondary | ICD-10-CM | POA: Diagnosis not present

## 2019-01-06 DIAGNOSIS — A084 Viral intestinal infection, unspecified: Secondary | ICD-10-CM | POA: Diagnosis not present

## 2019-01-06 NOTE — Progress Notes (Signed)
RFV: follow up for hospitalization for shingles,   Patient ID: Emily Cannon, female   DOB: 09-10-1932, 85 y.o.   MRN: [id number]  HPI Recently discharged on 6/6 for anemia as well as herpes zoster. She has left buttock and thigh involvement with "kissing" lesion to cleft of buttock. Using lidocaine cream, valtrex. Has had aki in the setting of new onset anemia due to GIB. She underwent EGD by dr Havery Moros with path suggesting CMV gastritis  Has hx of shingles vaccines  Outpatient Encounter Medications as of 01/06/2019  Medication Sig  . amLODipine (NORVASC) 10 MG tablet TAKE ONE TABLET BY MOUTH DAILY (Patient taking differently: Take 10 mg by mouth daily. )  . atorvastatin (LIPITOR) 20 MG tablet TAKE ONE TABLET BY MOUTH DAILY (Patient taking differently: Take 20 mg by mouth daily. )  . calcitRIOL (ROCALTROL) 0.25 MCG capsule Take 0.25 mcg by mouth every Monday, Wednesday, and Friday.   . Cholecalciferol (VITAMIN D-3 PO) Take 1 capsule by mouth every Monday, Wednesday, and Friday.  . dapsone 100 MG tablet Take 100 mg by mouth every morning.   . fluconazole (DIFLUCAN) 100 MG tablet Take 2 tablets (200 mg total) by mouth daily for 13 days.  . furosemide (LASIX) 40 MG tablet Take 0.5 tablets (20 mg total) by mouth 3 (three) times a week. Monday, Wed, Friday (Patient taking differently: Take 40 mg by mouth every Monday, Wednesday, and Friday. )  . gemfibrozil (LOPID) 600 MG tablet TAKE ONE TABLET BY MOUTH DAILY (Patient taking differently: Take 600 mg by mouth every morning. )  . glipiZIDE (GLUCOTROL) 5 MG tablet Take 2.5 mg by mouth daily before breakfast.  . hydrALAZINE (APRESOLINE) 50 MG tablet Take 1 tablet (50 mg total) by mouth 3 (three) times daily.  . hydrochlorothiazide (MICROZIDE) 12.5 MG capsule Take 12.5 mg by mouth daily.  . hydrocortisone (ANUSOL-HC) 25 MG suppository Place 1 suppository (25 mg total) rectally 2 (two) times daily for 30 days.  . Hydrocortisone (GERHARDT'S BUTT  CREAM) CREA Apply 1 application topically 3 (three) times daily. (Patient taking differently: Apply 1 application topically 3 (three) times daily. TO AFFECTED SITES)  . levothyroxine (SYNTHROID, LEVOTHROID) 50 MCG tablet TAKE ONE TABLET BY MOUTH EVERY MORNING BEFORE BREAKFAST (Patient taking differently: Take 50 mcg by mouth daily before breakfast. )  . magnesium oxide (MAG-OX) 400 MG tablet Take 400 mg by mouth daily with breakfast.  . metoprolol succinate (TOPROL-XL) 50 MG 24 hr tablet TAKE ONE TABLET BY MOUTH TWICE A DAY (Patient taking differently: Take 50 mg by mouth 2 (two) times a day. )  . Multiple Vitamins-Minerals (CENTRUM SILVER 50+WOMEN) TABS Take 1 tablet by mouth daily with breakfast.  . pantoprazole (PROTONIX) 40 MG tablet Take 1 tablet (40 mg total) by mouth 2 (two) times daily for 28 days.  . pantoprazole (PROTONIX) 40 MG tablet Take 1 tablet (40 mg total) by mouth daily for 30 days. Start after twice daily dosing  . predniSONE (DELTASONE) 20 MG tablet Take 3 tablets (60 mg total) by mouth daily before breakfast.  . valACYclovir (VALTREX) 1000 MG tablet Take 1 tablet (1,000 mg total) by mouth daily for 4 days.   No facility-administered encounter medications on file as of 01/06/2019.      Patient Active Problem List   Diagnosis Date Noted  . Heme positive stool   . Benign neoplasm of colon   . Acute blood loss anemia 12/30/2018  . Symptomatic anemia 12/15/2018  . AKI (acute kidney injury) (  [place]) 12/15/2018  . Hypomagnesemia 12/15/2018  . Steroid-induced hyperglycemia 12/15/2018  . Microscopic polyangiitis ([place]) 11/13/2018  . Positive P-ANCA titer 10/14/2018  . Viral URI 05/12/2018  . Syncope 06/05/2016  . Unspecified venous (peripheral) insufficiency 11/23/2012  . Dermatitis 11/10/2012  . Hypothyroidism 05/22/2011  . TYMPANIC MEMBRANE PERFORATION, LEFT EAR 08/08/2007  . Osteopenia 07/27/2007  . Hyperlipidemia 05/11/2007  . Anxiety state 05/11/2007  . Essential  hypertension 05/11/2007  . ALLERGIC RHINITIS 05/11/2007  . Irritable bowel syndrome 05/11/2007  . LOW BACK PAIN 05/11/2007  . HYSTERECTOMY, HX OF 05/11/2007     There are no preventive care reminders to display for this patient.   Review of Systems  Physical Exam   BP 136/61   Pulse 79   Temp 98 F (36.7 C)   Ht 5\' 1"  (1.549 m)   BMI 22.88 kg/m   Physical Exam  Constitutional:  oriented to person, place, and time. appears well-developed and well-nourished. No distress.  HENT: [place]/AT, PERRLA, no scleral icterus Mouth/Throat: Oropharynx is clear and moist. No oropharyngeal exudate.  Cardiovascular: Normal rate, regular rhythm and normal heart sounds. Exam reveals no gallop and no friction rub.  No murmur heard.  Pulmonary/Chest: Effort normal and breath sounds normal. No respiratory distress.  has no wheezes.  Neck = supple, no nuchal rigidity Abdominal: Soft. Bowel sounds are normal.  exhibits no distension. There is no tenderness.  Lymphadenopathy: no cervical adenopathy. No axillary adenopathy Neurological: alert and oriented to person, place, and time.  Skin: healing lesions, crusted on left buttock upper thigh. Kissing lesion on cleft of buttock. No open blisters Psychiatric: a normal mood and affect.  behavior is normal.   CBC Lab Results  Component Value Date   WBC 7.4 01/03/2019   RBC 2.53 (L) 01/03/2019   HGB 8.1 (L) 01/03/2019   HCT 25.0 (L) 01/03/2019   PLT 168 01/03/2019   MCV 98.8 01/03/2019   MCH 32.0 01/03/2019   MCHC 32.4 01/03/2019   RDW 22.3 (H) 01/03/2019   LYMPHSABS 0.6 (L) 12/19/2018   MONOABS 0.3 12/19/2018   EOSABS 0.0 12/19/2018    BMET Lab Results  Component Value Date   NA 140 01/03/2019   K 3.8 01/03/2019   CL 110 01/03/2019   CO2 23 01/03/2019   GLUCOSE 104 (H) 01/03/2019   BUN 37 (H) 01/03/2019   CREATININE 2.25 (H) 01/03/2019   CALCIUM 8.6 (L) 01/03/2019   GFRNONAA 20 (L) 01/03/2019   GFRAA 23 (L) 01/03/2019       Assessment and Plan  Recommend to finish out course of treatment Continue with current wound care, keep area dry No new lesions/blisters to suggest that she would need further valtrex  Underlying immunesuppression with pred and rituxan predisposes her for shingles.  ----------------- Addendum = due to CMV gastritis - will start on ganciclovir oral, renal dosing plan for 2 wk. Will need close monitoring of Cr Function  immunesuppression = will reach out to PCP/rheum to see if can decrease dose of steroids.

## 2019-01-07 DIAGNOSIS — E099 Drug or chemical induced diabetes mellitus without complications: Secondary | ICD-10-CM | POA: Diagnosis not present

## 2019-01-07 DIAGNOSIS — I872 Venous insufficiency (chronic) (peripheral): Secondary | ICD-10-CM | POA: Diagnosis not present

## 2019-01-07 DIAGNOSIS — M545 Low back pain: Secondary | ICD-10-CM | POA: Diagnosis not present

## 2019-01-07 DIAGNOSIS — T380X5D Adverse effect of glucocorticoids and synthetic analogues, subsequent encounter: Secondary | ICD-10-CM | POA: Diagnosis not present

## 2019-01-07 DIAGNOSIS — M317 Microscopic polyangiitis: Secondary | ICD-10-CM | POA: Diagnosis not present

## 2019-01-07 DIAGNOSIS — M8580 Other specified disorders of bone density and structure, unspecified site: Secondary | ICD-10-CM | POA: Diagnosis not present

## 2019-01-07 DIAGNOSIS — J309 Allergic rhinitis, unspecified: Secondary | ICD-10-CM | POA: Diagnosis not present

## 2019-01-07 DIAGNOSIS — E039 Hypothyroidism, unspecified: Secondary | ICD-10-CM | POA: Diagnosis not present

## 2019-01-07 DIAGNOSIS — D649 Anemia, unspecified: Secondary | ICD-10-CM | POA: Diagnosis not present

## 2019-01-07 DIAGNOSIS — K589 Irritable bowel syndrome without diarrhea: Secondary | ICD-10-CM | POA: Diagnosis not present

## 2019-01-07 DIAGNOSIS — I10 Essential (primary) hypertension: Secondary | ICD-10-CM | POA: Diagnosis not present

## 2019-01-07 DIAGNOSIS — E7800 Pure hypercholesterolemia, unspecified: Secondary | ICD-10-CM | POA: Diagnosis not present

## 2019-01-08 DIAGNOSIS — E039 Hypothyroidism, unspecified: Secondary | ICD-10-CM | POA: Diagnosis not present

## 2019-01-08 DIAGNOSIS — M545 Low back pain: Secondary | ICD-10-CM | POA: Diagnosis not present

## 2019-01-08 DIAGNOSIS — M8580 Other specified disorders of bone density and structure, unspecified site: Secondary | ICD-10-CM | POA: Diagnosis not present

## 2019-01-08 DIAGNOSIS — D649 Anemia, unspecified: Secondary | ICD-10-CM | POA: Diagnosis not present

## 2019-01-08 DIAGNOSIS — M317 Microscopic polyangiitis: Secondary | ICD-10-CM | POA: Diagnosis not present

## 2019-01-08 DIAGNOSIS — I10 Essential (primary) hypertension: Secondary | ICD-10-CM | POA: Diagnosis not present

## 2019-01-08 DIAGNOSIS — J309 Allergic rhinitis, unspecified: Secondary | ICD-10-CM | POA: Diagnosis not present

## 2019-01-08 DIAGNOSIS — E099 Drug or chemical induced diabetes mellitus without complications: Secondary | ICD-10-CM | POA: Diagnosis not present

## 2019-01-08 DIAGNOSIS — K589 Irritable bowel syndrome without diarrhea: Secondary | ICD-10-CM | POA: Diagnosis not present

## 2019-01-08 DIAGNOSIS — I872 Venous insufficiency (chronic) (peripheral): Secondary | ICD-10-CM | POA: Diagnosis not present

## 2019-01-08 DIAGNOSIS — T380X5D Adverse effect of glucocorticoids and synthetic analogues, subsequent encounter: Secondary | ICD-10-CM | POA: Diagnosis not present

## 2019-01-08 DIAGNOSIS — E7800 Pure hypercholesterolemia, unspecified: Secondary | ICD-10-CM | POA: Diagnosis not present

## 2019-01-08 NOTE — Telephone Encounter (Signed)
Husband called - he said dr foster increased dose to 3 times a day.  Pt is almost out of meds.  Asking for refill

## 2019-01-11 NOTE — Telephone Encounter (Signed)
Called patient and spoke to husband [name] and let him know that I do not see anything from Dr. Royce Macadamia stating the change and I will send message to Dr. Elease Hashimoto. [name] verbalized an understanding and knows that I am sending regular prescribed dosage until further notice and will send in new Rx once approved by Dr. Elease Hashimoto.  Please advise if TID is OK for medication requested.

## 2019-01-12 ENCOUNTER — Other Ambulatory Visit: Payer: Self-pay | Admitting: Family Medicine

## 2019-01-12 DIAGNOSIS — M8580 Other specified disorders of bone density and structure, unspecified site: Secondary | ICD-10-CM | POA: Diagnosis not present

## 2019-01-12 DIAGNOSIS — T380X5D Adverse effect of glucocorticoids and synthetic analogues, subsequent encounter: Secondary | ICD-10-CM | POA: Diagnosis not present

## 2019-01-12 DIAGNOSIS — M317 Microscopic polyangiitis: Secondary | ICD-10-CM | POA: Diagnosis not present

## 2019-01-12 DIAGNOSIS — E7800 Pure hypercholesterolemia, unspecified: Secondary | ICD-10-CM | POA: Diagnosis not present

## 2019-01-12 DIAGNOSIS — D649 Anemia, unspecified: Secondary | ICD-10-CM | POA: Diagnosis not present

## 2019-01-12 DIAGNOSIS — I872 Venous insufficiency (chronic) (peripheral): Secondary | ICD-10-CM | POA: Diagnosis not present

## 2019-01-12 DIAGNOSIS — I10 Essential (primary) hypertension: Secondary | ICD-10-CM | POA: Diagnosis not present

## 2019-01-12 DIAGNOSIS — K589 Irritable bowel syndrome without diarrhea: Secondary | ICD-10-CM | POA: Diagnosis not present

## 2019-01-12 DIAGNOSIS — E099 Drug or chemical induced diabetes mellitus without complications: Secondary | ICD-10-CM | POA: Diagnosis not present

## 2019-01-12 DIAGNOSIS — J309 Allergic rhinitis, unspecified: Secondary | ICD-10-CM | POA: Diagnosis not present

## 2019-01-12 DIAGNOSIS — M545 Low back pain: Secondary | ICD-10-CM | POA: Diagnosis not present

## 2019-01-12 DIAGNOSIS — E039 Hypothyroidism, unspecified: Secondary | ICD-10-CM | POA: Diagnosis not present

## 2019-01-12 NOTE — Telephone Encounter (Signed)
Refill Hydralazine as 50 mg po tid.

## 2019-01-13 NOTE — Telephone Encounter (Signed)
She is now followed by Nephrology and we need to confirm if they have started this.  They did d/c her Aldactone.

## 2019-01-13 NOTE — Telephone Encounter (Signed)
HCTZ was last filled by historical provider? OK to fill?  Spironolactone is not longer on medication list? Please advise?

## 2019-01-14 ENCOUNTER — Ambulatory Visit (HOSPITAL_COMMUNITY)
Admission: RE | Admit: 2019-01-14 | Discharge: 2019-01-14 | Disposition: A | Discharge status: Home / Self Care | Payer: Medicare Other | Source: Ambulatory Visit | Attending: Nephrology | Admitting: Nephrology

## 2019-01-14 ENCOUNTER — Other Ambulatory Visit: Payer: Self-pay

## 2019-01-14 VITALS — BP 124/45 | HR 74 | Temp 97.6°F

## 2019-01-14 DIAGNOSIS — D631 Anemia in chronic kidney disease: Secondary | ICD-10-CM | POA: Diagnosis not present

## 2019-01-14 DIAGNOSIS — N189 Chronic kidney disease, unspecified: Secondary | ICD-10-CM | POA: Insufficient documentation

## 2019-01-14 DIAGNOSIS — D649 Anemia, unspecified: Secondary | ICD-10-CM

## 2019-01-14 LAB — POCT HEMOGLOBIN-HEMACUE: Hemoglobin: 7.7 g/dL — ABNORMAL LOW (ref 12.0–15.0)

## 2019-01-14 MED ORDER — EPOETIN ALFA-EPBX 10000 UNIT/ML IJ SOLN
20000.0000 [IU] | INTRAMUSCULAR | Status: DC
  Administered 2019-01-14: 20000 [IU] via SUBCUTANEOUS
  Filled 2019-01-14: qty 2

## 2019-01-14 NOTE — Discharge Instructions (Signed)
Epoetin Alfa injection °What is this medicine? °EPOETIN ALFA (e POE e tin AL fa) helps your body make more red blood cells. This medicine is used to treat anemia caused by chronic kidney disease, cancer chemotherapy, or HIV-therapy. It may also be used before surgery if you have anemia. °This medicine may be used for other purposes; ask your health care provider or pharmacist if you have questions. °COMMON BRAND NAME(S): Epogen, Procrit, Retacrit °What should I tell my health care provider before I take this medicine? °They need to know if you have any of these conditions: °-cancer °-heart disease °-high blood pressure °-history of blood clots °-history of stroke °-low levels of folate, iron, or vitamin B12 in the blood °-seizures °-an unusual or allergic reaction to erythropoietin, albumin, benzyl alcohol, hamster proteins, other medicines, foods, dyes, or preservatives °-pregnant or trying to get pregnant °-breast-feeding °How should I use this medicine? °This medicine is for injection into a vein or under the skin. It is usually given by a health care professional in a hospital or clinic setting. °If you get this medicine at home, you will be taught how to prepare and give this medicine. Use exactly as directed. Take your medicine at regular intervals. Do not take your medicine more often than directed. °It is important that you put your used needles and syringes in a special sharps container. Do not put them in a trash can. If you do not have a sharps container, call your pharmacist or healthcare provider to get one. °A special MedGuide will be given to you by the pharmacist with each prescription and refill. Be sure to read this information carefully each time. °Talk to your pediatrician regarding the use of this medicine in children. While this drug may be prescribed for selected conditions, precautions do apply. °Overdosage: If you think you have taken too much of this medicine contact a poison control center  or emergency room at once. °NOTE: This medicine is only for you. Do not share this medicine with others. °What if I miss a dose? °If you miss a dose, take it as soon as you can. If it is almost time for your next dose, take only that dose. Do not take double or extra doses. °What may interact with this medicine? °Interactions have not been studied. °This list may not describe all possible interactions. Give your health care provider a list of all the medicines, herbs, non-prescription drugs, or dietary supplements you use. Also tell them if you smoke, drink alcohol, or use illegal drugs. Some items may interact with your medicine. °What should I watch for while using this medicine? °Your condition will be monitored carefully while you are receiving this medicine. °You may need blood work done while you are taking this medicine. °This medicine may cause a decrease in vitamin B6. You should make sure that you get enough vitamin B6 while you are taking this medicine. Discuss the foods you eat and the vitamins you take with your health care professional. °What side effects may I notice from receiving this medicine? °Side effects that you should report to your doctor or health care professional as soon as possible: °-allergic reactions like skin rash, itching or hives, swelling of the face, lips, or tongue °-seizures °-signs and symptoms of a blood clot such as breathing problems; changes in vision; chest pain; severe, sudden headache; pain, swelling, warmth in the leg; trouble speaking; sudden numbness or weakness of the face, arm or leg °-signs and symptoms of a stroke   like changes in vision; confusion; trouble speaking or understanding; severe headaches; sudden numbness or weakness of the face, arm or leg; trouble walking; dizziness; loss of balance or coordination °Side effects that usually do not require medical attention (report to your doctor or health care professional if they continue or are  bothersome): °-chills °-cough °-dizziness °-fever °-headaches °-joint pain °-muscle cramps °-muscle pain °-nausea, vomiting °-pain, redness, or irritation at site where injected °This list may not describe all possible side effects. Call your doctor for medical advice about side effects. You may report side effects to FDA at 1-800-FDA-1088. °Where should I keep my medicine? °Keep out of the reach of children. °Store in a refrigerator between 2 and 8 degrees C (36 and 46 degrees F). Do not freeze or shake. Throw away any unused portion if using a single-dose vial. Multi-dose vials can be kept in the refrigerator for up to 21 days after the initial dose. Throw away unused medicine. °NOTE: This sheet is a summary. It may not cover all possible information. If you have questions about this medicine, talk to your doctor, pharmacist, or health care provider. °© 2019 Elsevier/Gold Standard (2017-02-21 08:35:19) ° °

## 2019-01-14 NOTE — Progress Notes (Signed)
Dr Luis Abed office notified of hemocue of 7.7. instructed to continue plan of care

## 2019-01-15 ENCOUNTER — Ambulatory Visit (INDEPENDENT_AMBULATORY_CARE_PROVIDER_SITE_OTHER): Payer: Medicare Other | Admitting: Family Medicine

## 2019-01-15 ENCOUNTER — Other Ambulatory Visit: Payer: Self-pay

## 2019-01-15 DIAGNOSIS — I872 Venous insufficiency (chronic) (peripheral): Secondary | ICD-10-CM | POA: Diagnosis not present

## 2019-01-15 DIAGNOSIS — D649 Anemia, unspecified: Secondary | ICD-10-CM | POA: Diagnosis not present

## 2019-01-15 DIAGNOSIS — K589 Irritable bowel syndrome without diarrhea: Secondary | ICD-10-CM | POA: Diagnosis not present

## 2019-01-15 DIAGNOSIS — M545 Low back pain: Secondary | ICD-10-CM | POA: Diagnosis not present

## 2019-01-15 DIAGNOSIS — E7800 Pure hypercholesterolemia, unspecified: Secondary | ICD-10-CM

## 2019-01-15 DIAGNOSIS — M317 Microscopic polyangiitis: Secondary | ICD-10-CM

## 2019-01-15 DIAGNOSIS — E039 Hypothyroidism, unspecified: Secondary | ICD-10-CM | POA: Diagnosis not present

## 2019-01-15 DIAGNOSIS — I10 Essential (primary) hypertension: Secondary | ICD-10-CM

## 2019-01-15 DIAGNOSIS — M8580 Other specified disorders of bone density and structure, unspecified site: Secondary | ICD-10-CM | POA: Diagnosis not present

## 2019-01-15 DIAGNOSIS — T380X5D Adverse effect of glucocorticoids and synthetic analogues, subsequent encounter: Secondary | ICD-10-CM | POA: Diagnosis not present

## 2019-01-15 DIAGNOSIS — J309 Allergic rhinitis, unspecified: Secondary | ICD-10-CM | POA: Diagnosis not present

## 2019-01-15 DIAGNOSIS — E099 Drug or chemical induced diabetes mellitus without complications: Secondary | ICD-10-CM | POA: Diagnosis not present

## 2019-01-15 NOTE — Progress Notes (Signed)
Patient ID: Emily Cannon, female   DOB: March 04, 1933, 85 y.o.   MRN: [id number]  This visit type was conducted due to national recommendations for restrictions regarding the COVID-19 pandemic in an effort to limit this patient's exposure and mitigate transmission in our community.   Virtual Visit via Telephone Note  I connected with Emily Cannon on 01/15/19 at  1:30 PM EDT by telephone and verified that I am speaking with the correct person using two identifiers.   I discussed the limitations, risks, security and privacy concerns of performing an evaluation and management service by telephone and the availability of in person appointments. I also discussed with the patient that there may be a patient responsible charge related to this service. The patient expressed understanding and agreed to proceed.  Location patient: home Location provider: work or home office Participants present for the call: patient, provider Patient did not have a visit in the prior 7 days to address this/these issue(s).   History of Present Illness: This was a phone follow-up with patient and her husband regarding recent hospitalization.  She has history of hypertension, irritable bowel syndrome, hypothyroidism, osteopenia.  She recently presented with bilateral leg edema and petechiae.  Further evaluation of the petechia revealed evidence for microscopic polyangiitis.  She is followed now by rheumatology.  She has had some acute kidney injury and followed by nephrology.  Recent hospitalization from 6/ 3/20 to-6/ 7/20 reviewed  She was admitted with recurrent anemia with hemoglobin 6.6.  Creatinine had worsened from 2.2-2.7.  Stool occult positive.  She received 1 unit packed red blood cells.  She had EGD and colonoscopy.  This showed gastric ulcer which was nonbleeding and esophageal candidiasis.  She had not complained of any pain or difficulty swallowing.  She was placed on twice daily PPI for 1 month followed by once  daily.  Also treated with fluconazole for 14 days  She has been stable since discharge.  Blood pressures been excellent.  Discharge hemoglobin 8.1.  Discharge creatinine 2.25.  She was started on injection for her anemia which sounds like erythropoietin per nephrology.  She has had some generalized weakness and is getting home physical therapy.  She is overdue for a couple labs including lipids and TSH.   Observations/Objective: Patient sounds cheerful and well on the phone. I do not appreciate any SOB. Speech and thought processing are grossly intact. Patient reported vitals:  Assessment and Plan:  #1 microscopic polyangiitis which was recently diagnosed.  She has had issues with generalized weakness, acute kidney injury, and anemia  #2 recent Hemoccult positive stool with evidence for gastric ulcer on EGD.  Patient currently on twice daily PPI  #3 hypertension.  She is on multidrug regimen  #4 history of dyslipidemia treated with statin as well as fenofibrate  #5 hypothyroidism on replacement and overdue for labs  #6 chronic kidney disease  #7 recent bump in blood sugar related likely to prednisone therapy  Follow Up Instructions:  -Continue close follow-up with nephrology and rheumatology -Patient will need lipid panel and TSH and we have elected to wait until 1 month follow-up to get these -Continue to monitor blood pressure closely -Patient seems clear with instructions regarding her PPI therapy.  She is just finished out fluconazole and has no GI symptoms at this time   99441 5-10 99442 11-20 99443 21-30 I did not refer this patient for an OV in the next 24 hours for this/these issue(s).  I discussed the assessment and treatment plan  with the patient. The patient was provided an opportunity to ask questions and all were answered. The patient agreed with the plan and demonstrated an understanding of the instructions.   The patient was advised to call back or seek an  in-person evaluation if the symptoms worsen or if the condition fails to improve as anticipated.  I provided 25 minutes of non-face-to-face time during this encounter.   Carolann Littler, MD

## 2019-01-18 DIAGNOSIS — D649 Anemia, unspecified: Secondary | ICD-10-CM | POA: Diagnosis not present

## 2019-01-18 DIAGNOSIS — N179 Acute kidney failure, unspecified: Secondary | ICD-10-CM | POA: Diagnosis not present

## 2019-01-18 DIAGNOSIS — N2581 Secondary hyperparathyroidism of renal origin: Secondary | ICD-10-CM | POA: Diagnosis not present

## 2019-01-18 DIAGNOSIS — R801 Persistent proteinuria, unspecified: Secondary | ICD-10-CM | POA: Diagnosis not present

## 2019-01-18 DIAGNOSIS — I129 Hypertensive chronic kidney disease with stage 1 through stage 4 chronic kidney disease, or unspecified chronic kidney disease: Secondary | ICD-10-CM | POA: Diagnosis not present

## 2019-01-18 LAB — BASIC METABOLIC PANEL
BUN: 43 — AB (ref 4–21)
Creatinine: 2.3 — AB (ref 0.5–1.1)
Glucose: 308
Potassium: 5.2 (ref 3.4–5.3)
Sodium: 136 — AB (ref 137–147)

## 2019-01-19 ENCOUNTER — Other Ambulatory Visit: Payer: Self-pay

## 2019-01-19 DIAGNOSIS — N390 Urinary tract infection, site not specified: Secondary | ICD-10-CM | POA: Diagnosis not present

## 2019-01-19 DIAGNOSIS — M8580 Other specified disorders of bone density and structure, unspecified site: Secondary | ICD-10-CM | POA: Diagnosis not present

## 2019-01-19 DIAGNOSIS — T380X5D Adverse effect of glucocorticoids and synthetic analogues, subsequent encounter: Secondary | ICD-10-CM | POA: Diagnosis not present

## 2019-01-19 DIAGNOSIS — M317 Microscopic polyangiitis: Secondary | ICD-10-CM | POA: Diagnosis not present

## 2019-01-19 DIAGNOSIS — E039 Hypothyroidism, unspecified: Secondary | ICD-10-CM | POA: Diagnosis not present

## 2019-01-19 DIAGNOSIS — M545 Low back pain: Secondary | ICD-10-CM | POA: Diagnosis not present

## 2019-01-19 DIAGNOSIS — K589 Irritable bowel syndrome without diarrhea: Secondary | ICD-10-CM | POA: Diagnosis not present

## 2019-01-19 DIAGNOSIS — E099 Drug or chemical induced diabetes mellitus without complications: Secondary | ICD-10-CM | POA: Diagnosis not present

## 2019-01-19 DIAGNOSIS — J309 Allergic rhinitis, unspecified: Secondary | ICD-10-CM | POA: Diagnosis not present

## 2019-01-19 DIAGNOSIS — I872 Venous insufficiency (chronic) (peripheral): Secondary | ICD-10-CM | POA: Diagnosis not present

## 2019-01-19 DIAGNOSIS — L8990 Pressure ulcer of unspecified site, unspecified stage: Secondary | ICD-10-CM | POA: Diagnosis not present

## 2019-01-19 DIAGNOSIS — I10 Essential (primary) hypertension: Secondary | ICD-10-CM | POA: Diagnosis not present

## 2019-01-19 DIAGNOSIS — E7800 Pure hypercholesterolemia, unspecified: Secondary | ICD-10-CM | POA: Diagnosis not present

## 2019-01-19 DIAGNOSIS — D649 Anemia, unspecified: Secondary | ICD-10-CM | POA: Diagnosis not present

## 2019-01-19 MED ORDER — HYDRALAZINE HCL 50 MG PO TABS: 50.0000 mg | ORAL_TABLET | Freq: Three times a day (TID) | ORAL | 0 refills | Status: DC

## 2019-01-20 ENCOUNTER — Telehealth: Payer: Self-pay | Admitting: Pharmacist

## 2019-01-20 ENCOUNTER — Telehealth: Payer: Self-pay

## 2019-01-20 DIAGNOSIS — T380X5D Adverse effect of glucocorticoids and synthetic analogues, subsequent encounter: Secondary | ICD-10-CM | POA: Diagnosis not present

## 2019-01-20 DIAGNOSIS — M317 Microscopic polyangiitis: Secondary | ICD-10-CM | POA: Diagnosis not present

## 2019-01-20 DIAGNOSIS — D649 Anemia, unspecified: Secondary | ICD-10-CM | POA: Diagnosis not present

## 2019-01-20 DIAGNOSIS — E099 Drug or chemical induced diabetes mellitus without complications: Secondary | ICD-10-CM | POA: Diagnosis not present

## 2019-01-20 DIAGNOSIS — I10 Essential (primary) hypertension: Secondary | ICD-10-CM | POA: Diagnosis not present

## 2019-01-20 DIAGNOSIS — A084 Viral intestinal infection, unspecified: Secondary | ICD-10-CM

## 2019-01-20 DIAGNOSIS — K589 Irritable bowel syndrome without diarrhea: Secondary | ICD-10-CM | POA: Diagnosis not present

## 2019-01-20 DIAGNOSIS — J309 Allergic rhinitis, unspecified: Secondary | ICD-10-CM | POA: Diagnosis not present

## 2019-01-20 DIAGNOSIS — B259 Cytomegaloviral disease, unspecified: Secondary | ICD-10-CM

## 2019-01-20 DIAGNOSIS — M8580 Other specified disorders of bone density and structure, unspecified site: Secondary | ICD-10-CM | POA: Diagnosis not present

## 2019-01-20 DIAGNOSIS — M545 Low back pain: Secondary | ICD-10-CM | POA: Diagnosis not present

## 2019-01-20 DIAGNOSIS — I872 Venous insufficiency (chronic) (peripheral): Secondary | ICD-10-CM | POA: Diagnosis not present

## 2019-01-20 DIAGNOSIS — E7800 Pure hypercholesterolemia, unspecified: Secondary | ICD-10-CM | POA: Diagnosis not present

## 2019-01-20 DIAGNOSIS — E039 Hypothyroidism, unspecified: Secondary | ICD-10-CM | POA: Diagnosis not present

## 2019-01-20 MED ORDER — VALGANCICLOVIR HCL 450 MG PO TABS: 450.0000 mg | ORAL_TABLET | ORAL | 0 refills | Status: DC

## 2019-01-20 NOTE — Telephone Encounter (Signed)
Sent in [place] Rx to [facility] for patient's CMV gastritis per Dr. Baxter Flattery. Called patient's son, Emily Cannon, to inform him that the Rx has been sent in and explained how to take the medication, side effects associated with Valcyte, and needed follow-up.  He had concerns about her kidney function, so I explained the dose reduction and monitoring that we would do (she will take 450 mg q48h).  He states that the home health did not work out and she is not due for labs for another 2-3 weeks. Per Dr. Baxter Flattery, patient needs labs next week and a follow-up with her in early July.  Patient's son was not too thrilled about the needed appointments.  He states that he just sits around and waits to take her to appointments because she has so many. She is due to get an Epogen injection next Thursday July 2nd and is asking if our labs can be done with those labs.  Per Dr. Baxter Flattery, she will coordinate with Dr. Royce Macadamia to get the appropriate labs drawn. Patient's son also had a limited time that he could bring her in for a follow-up with Dr. Baxter Flattery and needed an early morning appointment. The only time he could bring her in was Wednesday July 8th at 930am. Put her on Dr. Storm Frisk schedule as a work in.

## 2019-01-20 NOTE — Telephone Encounter (Signed)
Called patient and he stated that Emily Cannon has Atorvastatin and Lipitor and he wanted to know what to give. I advised that she only needs to take one of these a day. Husband Emily Cannon has a hard time understanding which medications are used for and the generic and brand names and this is very confusing to him. Patient verbalized an understanding and stated that he will call if he has any more questions.  Copied from [place] [id number]. Topic: General - Call Back - No Documentation >> Jan 20, 2019 12:46 PM Erick Blinks wrote: Reason for CRM: Pt's husband called back because he has questions about recent medication and is requesting a call back from a nurse.   Best Contact: [phone] VM okay

## 2019-01-20 NOTE — Telephone Encounter (Signed)
Called patient and he stated that Dua has Atorvastatin and Lipitor and he wanted to know what to give. I advised that she only needs to take one of these a day. Husband [name] has a hard time understanding which medications are used for and the generic and brand names and this is very confusing to him. Patient verbalized an understanding and stated that he will call if he has any more questions.

## 2019-01-20 NOTE — Telephone Encounter (Signed)
Copied from [place] [id number]. Topic: General - Inquiry >> Jan 19, 2019  2:07 PM Emily Cannon L wrote: Reason for CRM:   Pt states he is returning a call to to [facility] regarding pts medications and what needs to be dropped.

## 2019-01-21 DIAGNOSIS — M8580 Other specified disorders of bone density and structure, unspecified site: Secondary | ICD-10-CM | POA: Diagnosis not present

## 2019-01-21 DIAGNOSIS — J309 Allergic rhinitis, unspecified: Secondary | ICD-10-CM | POA: Diagnosis not present

## 2019-01-21 DIAGNOSIS — D649 Anemia, unspecified: Secondary | ICD-10-CM | POA: Diagnosis not present

## 2019-01-21 DIAGNOSIS — T380X5D Adverse effect of glucocorticoids and synthetic analogues, subsequent encounter: Secondary | ICD-10-CM | POA: Diagnosis not present

## 2019-01-21 DIAGNOSIS — I10 Essential (primary) hypertension: Secondary | ICD-10-CM | POA: Diagnosis not present

## 2019-01-21 DIAGNOSIS — E099 Drug or chemical induced diabetes mellitus without complications: Secondary | ICD-10-CM | POA: Diagnosis not present

## 2019-01-21 DIAGNOSIS — M545 Low back pain: Secondary | ICD-10-CM | POA: Diagnosis not present

## 2019-01-21 DIAGNOSIS — I872 Venous insufficiency (chronic) (peripheral): Secondary | ICD-10-CM | POA: Diagnosis not present

## 2019-01-21 DIAGNOSIS — K589 Irritable bowel syndrome without diarrhea: Secondary | ICD-10-CM | POA: Diagnosis not present

## 2019-01-21 DIAGNOSIS — E7800 Pure hypercholesterolemia, unspecified: Secondary | ICD-10-CM | POA: Diagnosis not present

## 2019-01-21 DIAGNOSIS — E039 Hypothyroidism, unspecified: Secondary | ICD-10-CM | POA: Diagnosis not present

## 2019-01-21 DIAGNOSIS — M317 Microscopic polyangiitis: Secondary | ICD-10-CM | POA: Diagnosis not present

## 2019-01-25 ENCOUNTER — Other Ambulatory Visit: Payer: Self-pay | Admitting: Family Medicine

## 2019-01-25 ENCOUNTER — Telehealth: Payer: Self-pay | Admitting: *Deleted

## 2019-01-25 NOTE — Telephone Encounter (Signed)
Joann from [facility] called to clarify lab orders needed for valcyte treatment. Per Cassie, patient needs weekly cmet and cbc diff.  RN relayed orders, confirmed fax number for results. Landis Gandy, RN

## 2019-01-26 DIAGNOSIS — E7800 Pure hypercholesterolemia, unspecified: Secondary | ICD-10-CM | POA: Diagnosis not present

## 2019-01-26 DIAGNOSIS — M317 Microscopic polyangiitis: Secondary | ICD-10-CM | POA: Diagnosis not present

## 2019-01-26 DIAGNOSIS — E099 Drug or chemical induced diabetes mellitus without complications: Secondary | ICD-10-CM | POA: Diagnosis not present

## 2019-01-26 DIAGNOSIS — I10 Essential (primary) hypertension: Secondary | ICD-10-CM | POA: Diagnosis not present

## 2019-01-26 DIAGNOSIS — E039 Hypothyroidism, unspecified: Secondary | ICD-10-CM | POA: Diagnosis not present

## 2019-01-26 DIAGNOSIS — M8580 Other specified disorders of bone density and structure, unspecified site: Secondary | ICD-10-CM | POA: Diagnosis not present

## 2019-01-26 DIAGNOSIS — J309 Allergic rhinitis, unspecified: Secondary | ICD-10-CM | POA: Diagnosis not present

## 2019-01-26 DIAGNOSIS — K589 Irritable bowel syndrome without diarrhea: Secondary | ICD-10-CM | POA: Diagnosis not present

## 2019-01-26 DIAGNOSIS — D649 Anemia, unspecified: Secondary | ICD-10-CM | POA: Diagnosis not present

## 2019-01-26 DIAGNOSIS — I872 Venous insufficiency (chronic) (peripheral): Secondary | ICD-10-CM | POA: Diagnosis not present

## 2019-01-26 DIAGNOSIS — M545 Low back pain: Secondary | ICD-10-CM | POA: Diagnosis not present

## 2019-01-26 DIAGNOSIS — T380X5D Adverse effect of glucocorticoids and synthetic analogues, subsequent encounter: Secondary | ICD-10-CM | POA: Diagnosis not present

## 2019-01-27 ENCOUNTER — Telehealth: Payer: Self-pay | Admitting: Family Medicine

## 2019-01-27 DIAGNOSIS — N179 Acute kidney failure, unspecified: Secondary | ICD-10-CM | POA: Diagnosis not present

## 2019-01-27 DIAGNOSIS — R801 Persistent proteinuria, unspecified: Secondary | ICD-10-CM | POA: Diagnosis not present

## 2019-01-27 DIAGNOSIS — D649 Anemia, unspecified: Secondary | ICD-10-CM | POA: Diagnosis not present

## 2019-01-27 DIAGNOSIS — N2581 Secondary hyperparathyroidism of renal origin: Secondary | ICD-10-CM | POA: Diagnosis not present

## 2019-01-27 DIAGNOSIS — N184 Chronic kidney disease, stage 4 (severe): Secondary | ICD-10-CM | POA: Diagnosis not present

## 2019-01-27 DIAGNOSIS — I129 Hypertensive chronic kidney disease with stage 1 through stage 4 chronic kidney disease, or unspecified chronic kidney disease: Secondary | ICD-10-CM | POA: Diagnosis not present

## 2019-01-27 NOTE — Telephone Encounter (Signed)
General/Other - Message  The patient saw he kidney doctor and he took her off of the glipiZIDE (GLUCOTROL) 5 MG tablet [[id number]] and her sugar levels were elevated. Please advise the patient.

## 2019-01-28 ENCOUNTER — Ambulatory Visit (HOSPITAL_COMMUNITY)
Admission: RE | Admit: 2019-01-28 | Discharge: 2019-01-28 | Disposition: A | Discharge status: Home / Self Care | Payer: Medicare Other | Source: Ambulatory Visit | Attending: Nephrology | Admitting: Nephrology

## 2019-01-28 ENCOUNTER — Other Ambulatory Visit: Payer: Self-pay

## 2019-01-28 VITALS — BP 144/53 | HR 65 | Temp 97.9°F

## 2019-01-28 DIAGNOSIS — D649 Anemia, unspecified: Secondary | ICD-10-CM

## 2019-01-28 DIAGNOSIS — D631 Anemia in chronic kidney disease: Secondary | ICD-10-CM | POA: Diagnosis not present

## 2019-01-28 DIAGNOSIS — N184 Chronic kidney disease, stage 4 (severe): Secondary | ICD-10-CM | POA: Insufficient documentation

## 2019-01-28 MED ORDER — EPOETIN ALFA-EPBX 10000 UNIT/ML IJ SOLN
20000.0000 [IU] | INTRAMUSCULAR | Status: DC
  Administered 2019-01-28: 14:00:00 20000 [IU] via SUBCUTANEOUS
  Filled 2019-01-28: qty 2

## 2019-01-29 DIAGNOSIS — I10 Essential (primary) hypertension: Secondary | ICD-10-CM | POA: Diagnosis not present

## 2019-01-29 DIAGNOSIS — D649 Anemia, unspecified: Secondary | ICD-10-CM | POA: Diagnosis not present

## 2019-01-29 DIAGNOSIS — J309 Allergic rhinitis, unspecified: Secondary | ICD-10-CM | POA: Diagnosis not present

## 2019-01-29 DIAGNOSIS — E039 Hypothyroidism, unspecified: Secondary | ICD-10-CM | POA: Diagnosis not present

## 2019-01-29 DIAGNOSIS — M8580 Other specified disorders of bone density and structure, unspecified site: Secondary | ICD-10-CM | POA: Diagnosis not present

## 2019-01-29 DIAGNOSIS — K589 Irritable bowel syndrome without diarrhea: Secondary | ICD-10-CM | POA: Diagnosis not present

## 2019-01-29 DIAGNOSIS — M545 Low back pain: Secondary | ICD-10-CM | POA: Diagnosis not present

## 2019-01-29 DIAGNOSIS — E7800 Pure hypercholesterolemia, unspecified: Secondary | ICD-10-CM | POA: Diagnosis not present

## 2019-01-29 DIAGNOSIS — M317 Microscopic polyangiitis: Secondary | ICD-10-CM | POA: Diagnosis not present

## 2019-01-29 DIAGNOSIS — I872 Venous insufficiency (chronic) (peripheral): Secondary | ICD-10-CM | POA: Diagnosis not present

## 2019-01-29 DIAGNOSIS — T380X5D Adverse effect of glucocorticoids and synthetic analogues, subsequent encounter: Secondary | ICD-10-CM | POA: Diagnosis not present

## 2019-01-29 DIAGNOSIS — E099 Drug or chemical induced diabetes mellitus without complications: Secondary | ICD-10-CM | POA: Diagnosis not present

## 2019-02-01 ENCOUNTER — Other Ambulatory Visit: Payer: Self-pay | Admitting: Pharmacist

## 2019-02-01 DIAGNOSIS — B259 Cytomegaloviral disease, unspecified: Secondary | ICD-10-CM

## 2019-02-01 DIAGNOSIS — A084 Viral intestinal infection, unspecified: Secondary | ICD-10-CM

## 2019-02-01 LAB — POCT HEMOGLOBIN-HEMACUE: Hemoglobin: 7.9 g/dL — ABNORMAL LOW (ref 12.0–15.0)

## 2019-02-01 NOTE — Telephone Encounter (Signed)
There are two pieces of information we need in regulating her Glipizide.  First, what dose of prednisone is she on- and are they tapering down?    Second (most importantly) what are her home blood sugars doing?  If they have not checked any recent home sugars that would be very helpful.  If fasting sugars consistently < 120, I would leave off the Glipizide at this time.

## 2019-02-01 NOTE — Telephone Encounter (Signed)
Need more details. How high are her glucose levels?

## 2019-02-01 NOTE — Telephone Encounter (Signed)
I called patient and spoke to her husband [name] and he stated that her doctor recommended that Dr. Elease Hashimoto take over the glipizide 5mg  control as they are tapering down the prednisone and they do not want to take over glipizide refill orders or dosage instructions. [name] stated that she is still taking this medication until further notice. There are some glucose readings in lab results. [name] stated that he has a glucose machine and can check her sugars if need be. Please advise.

## 2019-02-01 NOTE — Telephone Encounter (Signed)
Please see message.  Please advise. 

## 2019-02-02 ENCOUNTER — Telehealth: Payer: Self-pay

## 2019-02-02 NOTE — Telephone Encounter (Signed)
Copied from [place] [id number]. Topic: General - Other >> Feb 02, 2019  2:00 PM Mcneil, Ja-Kwan wrote: Reason for CRM: Hinton Dyer with [facility] stated pt son Marden Noble is upset because he is requesting that they do lab work but they do not have an order for labs. Dana requests call back. Cb# [phone]

## 2019-02-03 ENCOUNTER — Ambulatory Visit: Payer: Medicare Other | Admitting: Internal Medicine

## 2019-02-03 ENCOUNTER — Other Ambulatory Visit: Payer: Self-pay

## 2019-02-03 ENCOUNTER — Encounter: Payer: Self-pay | Admitting: Internal Medicine

## 2019-02-03 VITALS — BP 160/74 | HR 72 | Temp 98.3°F | Wt 130.0 lb

## 2019-02-03 DIAGNOSIS — Z8619 Personal history of other infectious and parasitic diseases: Secondary | ICD-10-CM | POA: Diagnosis not present

## 2019-02-03 DIAGNOSIS — M545 Low back pain: Secondary | ICD-10-CM | POA: Diagnosis not present

## 2019-02-03 DIAGNOSIS — B259 Cytomegaloviral disease, unspecified: Secondary | ICD-10-CM

## 2019-02-03 DIAGNOSIS — I872 Venous insufficiency (chronic) (peripheral): Secondary | ICD-10-CM | POA: Diagnosis not present

## 2019-02-03 DIAGNOSIS — M317 Microscopic polyangiitis: Secondary | ICD-10-CM | POA: Diagnosis not present

## 2019-02-03 DIAGNOSIS — I10 Essential (primary) hypertension: Secondary | ICD-10-CM | POA: Diagnosis not present

## 2019-02-03 DIAGNOSIS — D649 Anemia, unspecified: Secondary | ICD-10-CM | POA: Diagnosis not present

## 2019-02-03 DIAGNOSIS — K589 Irritable bowel syndrome without diarrhea: Secondary | ICD-10-CM | POA: Diagnosis not present

## 2019-02-03 DIAGNOSIS — J309 Allergic rhinitis, unspecified: Secondary | ICD-10-CM | POA: Diagnosis not present

## 2019-02-03 DIAGNOSIS — M8580 Other specified disorders of bone density and structure, unspecified site: Secondary | ICD-10-CM | POA: Diagnosis not present

## 2019-02-03 DIAGNOSIS — E099 Drug or chemical induced diabetes mellitus without complications: Secondary | ICD-10-CM | POA: Diagnosis not present

## 2019-02-03 DIAGNOSIS — A084 Viral intestinal infection, unspecified: Secondary | ICD-10-CM | POA: Diagnosis not present

## 2019-02-03 DIAGNOSIS — D638 Anemia in other chronic diseases classified elsewhere: Secondary | ICD-10-CM | POA: Diagnosis not present

## 2019-02-03 DIAGNOSIS — E039 Hypothyroidism, unspecified: Secondary | ICD-10-CM | POA: Diagnosis not present

## 2019-02-03 DIAGNOSIS — T380X5D Adverse effect of glucocorticoids and synthetic analogues, subsequent encounter: Secondary | ICD-10-CM | POA: Diagnosis not present

## 2019-02-03 DIAGNOSIS — N184 Chronic kidney disease, stage 4 (severe): Secondary | ICD-10-CM | POA: Diagnosis not present

## 2019-02-03 DIAGNOSIS — E7800 Pure hypercholesterolemia, unspecified: Secondary | ICD-10-CM | POA: Diagnosis not present

## 2019-02-03 NOTE — Telephone Encounter (Signed)
Called patient and LMOVM to return call  [place] for [facility] to Discuss results / PCP / recommendations / Schedule patient  Left a detailed message for Hinton Dyer with [facility] to let her know that we do not have any labs to order and the patient is seeing several doctors and they are doing labs and we do not know what her son Marden Noble is requesting but she may find out and call us back with a more detailed message for what labs are needed and why and which doctor is requesting.  CRM Created.

## 2019-02-03 NOTE — Telephone Encounter (Signed)
Called patient and spoke to [place] and he stated that [place] is currently on 35 mg of Prednisone and he is tapering down 5 mg every 2 weeks.  [name] stated that he will stop giving glipizide and check her fasting blood sugar and will keep a record and let us know if over 120. [name] verbalized an understanding and stated he will let us know.

## 2019-02-03 NOTE — Progress Notes (Signed)
RFV: follow up for CMV gastritis Patient ID: Emily Cannon, female   DOB: 02/25/1933, 85 y.o.   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 She was started on valcyte, renally dosed on 01/20/19, thus far tolerating medication. She had labs drawn last week. Her son reports difficulty coordinating her lab work between her various physicians (foster-renal, beekman-rheum, [facility]bruster-GI)  Dr Amil Amen was planning on slowly tapering off her prednisone- going down to 35mg  in 2 wk., then down by 1mg  per 2 wks. She reports her skin lesions for shingles outbreak have healed, nearly healed on buttocks  She is now seeing dr foster every month(decreased frequency) Outpatient Encounter Medications as of 02/03/2019  Medication Sig  . amLODipine (NORVASC) 10 MG tablet TAKE ONE TABLET BY MOUTH DAILY  . atorvastatin (LIPITOR) 20 MG tablet TAKE ONE TABLET BY MOUTH DAILY  . calcitRIOL (ROCALTROL) 0.25 MCG capsule Take 0.25 mcg by mouth every Monday, Wednesday, and Friday.   . Cholecalciferol (VITAMIN D-3 PO) Take 1 capsule by mouth every Monday, Wednesday, and Friday.  . dapsone 100 MG tablet Take 100 mg by mouth every morning.   . furosemide (LASIX) 40 MG tablet Take 0.5 tablets (20 mg total) by mouth 3 (three) times a week. Monday, Wed, Friday (Patient taking differently: Take 40 mg by mouth every Monday, Wednesday, and Friday. )  . gemfibrozil (LOPID) 600 MG tablet TAKE ONE TABLET BY MOUTH DAILY  . glipiZIDE (GLUCOTROL) 5 MG tablet Take 2.5 mg by mouth daily before breakfast.  . hydrALAZINE (APRESOLINE) 50 MG tablet Take 1 tablet (50 mg total) by mouth 3 (three) times daily.  . hydrochlorothiazide (MICROZIDE) 12.5 MG capsule Take 12.5 mg by mouth daily.  . Hydrocortisone (GERHARDT'S BUTT CREAM) CREA  Apply 1 application topically 3 (three) times daily. (Patient taking differently: Apply 1 application topically 3 (three) times daily. TO AFFECTED SITES)  . levothyroxine (SYNTHROID) 50 MCG tablet TAKE ONE TABLET BY MOUTH EVERY MORNING BEFORE BREAKFAST  . magnesium oxide (MAG-OX) 400 MG tablet Take 400 mg by mouth daily with breakfast.  . metoprolol succinate (TOPROL-XL) 50 MG 24 hr tablet TAKE ONE TABLET BY MOUTH TWICE A DAY (Patient taking differently: Take 50 mg by mouth 2 (two) times a day. )  . Multiple Vitamins-Minerals (CENTRUM SILVER 50+WOMEN) TABS Take 1 tablet by mouth daily with breakfast.  . pantoprazole (PROTONIX) 40 MG tablet Take 1 tablet (40 mg total) by mouth 2 (two) times daily for 28 days.  . pantoprazole (PROTONIX) 40 MG tablet Take 1 tablet (40 mg total) by mouth daily for 30 days. Start after twice daily dosing  . predniSONE (DELTASONE) 20 MG tablet Take 3 tablets (60 mg total) by mouth daily before breakfast.  . valGANciclovir (VALCYTE) 450 MG tablet Take 1 tablet (450 mg total) by mouth every other day.   No facility-administered encounter medications on file as of 02/03/2019.      Patient Active Problem List   Diagnosis Date Noted  . Heme positive stool   . Benign neoplasm of colon   . Acute blood loss anemia 12/30/2018  . Symptomatic anemia 12/15/2018  . AKI (acute kidney injury) ([place]) 12/15/2018  . Hypomagnesemia 12/15/2018  . Steroid-induced hyperglycemia 12/15/2018  . Microscopic polyangiitis ([place]) 11/13/2018  . Positive P-ANCA titer 10/14/2018  . Viral URI 05/12/2018  .  Syncope 06/05/2016  . Unspecified venous (peripheral) insufficiency 11/23/2012  . Dermatitis 11/10/2012  . Hypothyroidism 05/22/2011  . TYMPANIC MEMBRANE PERFORATION, LEFT EAR 08/08/2007  . Osteopenia 07/27/2007  . Hyperlipidemia 05/11/2007  . Anxiety state 05/11/2007  . Essential hypertension 05/11/2007  . ALLERGIC RHINITIS 05/11/2007  . Irritable bowel syndrome 05/11/2007  . LOW BACK  PAIN 05/11/2007  . HYSTERECTOMY, HX OF 05/11/2007     There are no preventive care reminders to display for this patient.   Review of Systems Hard of hearing, 12 point ros is negative Physical Exam  BP (!) 160/74   Pulse 72   Temp 98.3 F (36.8 C) (Oral)   Wt 130 lb (59 kg)   BMI 24.56 kg/m  Physical Exam  Constitutional:  oriented to person, place, and time. appears well-developed and well-nourished. No distress.  HENT: [place]/AT, PERRLA, no scleral icterus Mouth/Throat: Oropharynx is clear and moist. No oropharyngeal exudate.  Cardiovascular: Normal rate, regular rhythm and normal heart sounds. Exam reveals no gallop and no friction rub.  No murmur heard.  Pulmonary/Chest: Effort normal and breath sounds normal. No respiratory distress.  has no wheezes.  Neurological: alert and oriented to person, place, and time.  Skin: Skin is warm and dry. No rash noted. No erythema.  Psychiatric: a normal mood and affect.  behavior is normal.   CBC Lab Results  Component Value Date   WBC 7.4 01/03/2019   RBC 2.53 (L) 01/03/2019   HGB 7.9 (L) 01/28/2019   HCT 25.0 (L) 01/03/2019   PLT 168 01/03/2019   MCV 98.8 01/03/2019   MCH 32.0 01/03/2019   MCHC 32.4 01/03/2019   RDW 22.3 (H) 01/03/2019   LYMPHSABS 0.6 (L) 12/19/2018   MONOABS 0.3 12/19/2018   EOSABS 0.0 12/19/2018    BMET Lab Results  Component Value Date   NA 140 01/03/2019   K 3.8 01/03/2019   CL 110 01/03/2019   CO2 23 01/03/2019   GLUCOSE 104 (H) 01/03/2019   BUN 37 (H) 01/03/2019   CREATININE 2.25 (H) 01/03/2019   CALCIUM 8.6 (L) 01/03/2019   GFRNONAA 20 (L) 01/03/2019   GFRAA 23 (L) 01/03/2019      Assessment and Plan  cmv gastritis = length of treatment is somewhat dependent on how she tolerates medication and also may need to repeat EGD to see improvement. I suspect that her immunosuppression with high doses of prednisone makes this somewhat challenging. Will aim to treat for 4 wk but obviously may change  end date depending on how she tolerates medication and discuss with dr Havery Moros the possibility of repeating egd.  For now, will keep monitoring. Will need twice a week cbc with diff and bmp - ideally. And at minimum, once per week. We will check bmp and cbc today  Lab Results  Component Value Date   WBC 6.2 02/03/2019   HGB 7.9 (L) 02/03/2019   HCT 23.9 (L) 02/03/2019   MCV 102.1 (H) 02/03/2019   PLT 222 02/03/2019   Lab Results  Component Value Date   CREATININE 2.03 (H) 02/03/2019   ckd = cr improved to 2.03  Anemia = still low at hgb 7.9 with hct 23.9. she receives epogen every other week. Will plan to get lab work next week through infusion center.

## 2019-02-04 LAB — CBC WITH DIFFERENTIAL/PLATELET
Absolute Monocytes: 217 cells/uL (ref 200–950)
Basophils Absolute: 31 cells/uL (ref 0–200)
Basophils Relative: 0.5 %
Eosinophils Absolute: 31 cells/uL (ref 15–500)
Eosinophils Relative: 0.5 %
HCT: 23.9 % — ABNORMAL LOW (ref 35.0–45.0)
Hemoglobin: 7.9 g/dL — ABNORMAL LOW (ref 11.7–15.5)
Lymphs Abs: 769 cells/uL — ABNORMAL LOW (ref 850–3900)
MCH: 33.8 pg — ABNORMAL HIGH (ref 27.0–33.0)
MCHC: 33.1 g/dL (ref 32.0–36.0)
MCV: 102.1 fL — ABNORMAL HIGH (ref 80.0–100.0)
MPV: 9.9 fL (ref 7.5–12.5)
Monocytes Relative: 3.5 %
Neutro Abs: 5152 cells/uL (ref 1500–7800)
Neutrophils Relative %: 83.1 %
Platelets: 222 10*3/uL (ref 140–400)
RBC: 2.34 10*6/uL — ABNORMAL LOW (ref 3.80–5.10)
RDW: 17.1 % — ABNORMAL HIGH (ref 11.0–15.0)
Total Lymphocyte: 12.4 %
WBC: 6.2 10*3/uL (ref 3.8–10.8)

## 2019-02-04 LAB — BASIC METABOLIC PANEL
BUN/Creatinine Ratio: 14 (calc) (ref 6–22)
BUN: 29 mg/dL — ABNORMAL HIGH (ref 7–25)
CO2: 26 mmol/L (ref 20–32)
Calcium: 8.6 mg/dL (ref 8.6–10.4)
Chloride: 105 mmol/L (ref 98–110)
Creat: 2.03 mg/dL — ABNORMAL HIGH (ref 0.60–0.88)
Glucose, Bld: 57 mg/dL — ABNORMAL LOW (ref 65–99)
Potassium: 4 mmol/L (ref 3.5–5.3)
Sodium: 140 mmol/L (ref 135–146)

## 2019-02-07 ENCOUNTER — Other Ambulatory Visit: Payer: Self-pay | Admitting: Family Medicine

## 2019-02-09 ENCOUNTER — Telehealth: Payer: Self-pay

## 2019-02-09 DIAGNOSIS — M317 Microscopic polyangiitis: Secondary | ICD-10-CM | POA: Diagnosis not present

## 2019-02-09 NOTE — Telephone Encounter (Signed)
Okay for refill? Please advise 

## 2019-02-09 NOTE — Telephone Encounter (Signed)
Emily Cannon and he stated that patients blood sugar readings have been 104, 72, 172 after eating a [place] dinner from [employer], 73, 77, most of these readings were done about 7:30am to 8am.  Patient has stopped glipizide and wanted to know if any changes with the readings he has provided?  Copied from [place] [phone]. Topic: General - Other >> Feb 09, 2019  3:26 PM [name] wrote: Reason for CRM: Patient husband Emily Cannon called to request a call back from [facility] states that it is in regards to her blood sugar results over the weekend Ph# [phone]

## 2019-02-09 NOTE — Telephone Encounter (Signed)
She is being followed by nephrology and I am not sure if they still have her on this.  I would recommend that they confirm she is taking with nephrologist.

## 2019-02-10 ENCOUNTER — Other Ambulatory Visit (HOSPITAL_COMMUNITY): Payer: Self-pay | Admitting: *Deleted

## 2019-02-10 DIAGNOSIS — D649 Anemia, unspecified: Secondary | ICD-10-CM | POA: Diagnosis not present

## 2019-02-10 DIAGNOSIS — M545 Low back pain: Secondary | ICD-10-CM | POA: Diagnosis not present

## 2019-02-10 DIAGNOSIS — J309 Allergic rhinitis, unspecified: Secondary | ICD-10-CM | POA: Diagnosis not present

## 2019-02-10 DIAGNOSIS — M8580 Other specified disorders of bone density and structure, unspecified site: Secondary | ICD-10-CM | POA: Diagnosis not present

## 2019-02-10 DIAGNOSIS — E039 Hypothyroidism, unspecified: Secondary | ICD-10-CM | POA: Diagnosis not present

## 2019-02-10 DIAGNOSIS — E099 Drug or chemical induced diabetes mellitus without complications: Secondary | ICD-10-CM | POA: Diagnosis not present

## 2019-02-10 DIAGNOSIS — I872 Venous insufficiency (chronic) (peripheral): Secondary | ICD-10-CM | POA: Diagnosis not present

## 2019-02-10 DIAGNOSIS — E7800 Pure hypercholesterolemia, unspecified: Secondary | ICD-10-CM | POA: Diagnosis not present

## 2019-02-10 DIAGNOSIS — K589 Irritable bowel syndrome without diarrhea: Secondary | ICD-10-CM | POA: Diagnosis not present

## 2019-02-10 DIAGNOSIS — M317 Microscopic polyangiitis: Secondary | ICD-10-CM | POA: Diagnosis not present

## 2019-02-10 DIAGNOSIS — T380X5D Adverse effect of glucocorticoids and synthetic analogues, subsequent encounter: Secondary | ICD-10-CM | POA: Diagnosis not present

## 2019-02-10 DIAGNOSIS — I10 Essential (primary) hypertension: Secondary | ICD-10-CM | POA: Diagnosis not present

## 2019-02-10 NOTE — Telephone Encounter (Signed)
Called patient and spoke to her husband and gave him the message. [name] stated that he has concerns because he has such a hard time getting in touch with [place]. I advised to patient that if it is urgent to contact us again if he has not heard anything and he stated that sometimes it takes 3-4 days to get back to them. Husband verbalized an understanding.

## 2019-02-10 NOTE — Telephone Encounter (Signed)
Agree with leaving off the Glipizide at this time.

## 2019-02-11 ENCOUNTER — Ambulatory Visit (HOSPITAL_COMMUNITY)
Admission: RE | Admit: 2019-02-11 | Discharge: 2019-02-11 | Disposition: A | Discharge status: Home / Self Care | Payer: Medicare Other | Source: Ambulatory Visit | Attending: Nephrology | Admitting: Nephrology

## 2019-02-11 ENCOUNTER — Other Ambulatory Visit: Payer: Self-pay

## 2019-02-11 VITALS — BP 151/56 | HR 97 | Temp 97.2°F | Resp 20 | Ht 62.0 in | Wt 130.0 lb

## 2019-02-11 DIAGNOSIS — N184 Chronic kidney disease, stage 4 (severe): Secondary | ICD-10-CM | POA: Diagnosis not present

## 2019-02-11 DIAGNOSIS — D649 Anemia, unspecified: Secondary | ICD-10-CM

## 2019-02-11 DIAGNOSIS — D631 Anemia in chronic kidney disease: Secondary | ICD-10-CM | POA: Insufficient documentation

## 2019-02-11 LAB — POCT HEMOGLOBIN-HEMACUE: Hemoglobin: 8.4 g/dL — ABNORMAL LOW (ref 12.0–15.0)

## 2019-02-11 MED ORDER — EPOETIN ALFA-EPBX 10000 UNIT/ML IJ SOLN
30000.0000 [IU] | INTRAMUSCULAR | Status: DC
  Administered 2019-02-11: 30000 [IU] via SUBCUTANEOUS
  Filled 2019-02-11: qty 3

## 2019-02-11 MED ORDER — SODIUM CHLORIDE 0.9 % IV SOLN
510.0000 mg | Freq: Once | INTRAVENOUS | Status: AC
  Administered 2019-02-11: 510 mg via INTRAVENOUS
  Filled 2019-02-11: qty 17

## 2019-02-11 NOTE — Discharge Instructions (Signed)

## 2019-02-12 NOTE — Telephone Encounter (Signed)
Called [facility]. Provider Dr.Foster, but no answer. Left a vm for f/u clarification.

## 2019-02-12 NOTE — Telephone Encounter (Signed)
Noted  

## 2019-02-16 DIAGNOSIS — M317 Microscopic polyangiitis: Secondary | ICD-10-CM | POA: Diagnosis not present

## 2019-02-16 DIAGNOSIS — I872 Venous insufficiency (chronic) (peripheral): Secondary | ICD-10-CM | POA: Diagnosis not present

## 2019-02-16 DIAGNOSIS — M8580 Other specified disorders of bone density and structure, unspecified site: Secondary | ICD-10-CM | POA: Diagnosis not present

## 2019-02-16 DIAGNOSIS — E099 Drug or chemical induced diabetes mellitus without complications: Secondary | ICD-10-CM | POA: Diagnosis not present

## 2019-02-16 DIAGNOSIS — D649 Anemia, unspecified: Secondary | ICD-10-CM | POA: Diagnosis not present

## 2019-02-16 DIAGNOSIS — J309 Allergic rhinitis, unspecified: Secondary | ICD-10-CM | POA: Diagnosis not present

## 2019-02-16 DIAGNOSIS — E039 Hypothyroidism, unspecified: Secondary | ICD-10-CM | POA: Diagnosis not present

## 2019-02-16 DIAGNOSIS — E7800 Pure hypercholesterolemia, unspecified: Secondary | ICD-10-CM | POA: Diagnosis not present

## 2019-02-16 DIAGNOSIS — K589 Irritable bowel syndrome without diarrhea: Secondary | ICD-10-CM | POA: Diagnosis not present

## 2019-02-16 DIAGNOSIS — T380X5D Adverse effect of glucocorticoids and synthetic analogues, subsequent encounter: Secondary | ICD-10-CM | POA: Diagnosis not present

## 2019-02-16 DIAGNOSIS — I10 Essential (primary) hypertension: Secondary | ICD-10-CM | POA: Diagnosis not present

## 2019-02-16 DIAGNOSIS — M545 Low back pain: Secondary | ICD-10-CM | POA: Diagnosis not present

## 2019-02-17 ENCOUNTER — Other Ambulatory Visit: Payer: Self-pay

## 2019-02-17 ENCOUNTER — Ambulatory Visit: Payer: Medicare Other | Admitting: Internal Medicine

## 2019-02-17 ENCOUNTER — Encounter: Payer: Self-pay | Admitting: Internal Medicine

## 2019-02-17 VITALS — BP 157/57 | HR 75 | Temp 98.0°F | Wt 128.0 lb

## 2019-02-17 DIAGNOSIS — A084 Viral intestinal infection, unspecified: Secondary | ICD-10-CM | POA: Diagnosis not present

## 2019-02-17 DIAGNOSIS — B259 Cytomegaloviral disease, unspecified: Secondary | ICD-10-CM | POA: Diagnosis not present

## 2019-02-17 DIAGNOSIS — Z8619 Personal history of other infectious and parasitic diseases: Secondary | ICD-10-CM | POA: Diagnosis not present

## 2019-02-17 DIAGNOSIS — N184 Chronic kidney disease, stage 4 (severe): Secondary | ICD-10-CM | POA: Diagnosis not present

## 2019-02-17 MED ORDER — VALGANCICLOVIR HCL 450 MG PO TABS: 450.0000 mg | ORAL_TABLET | ORAL | 1 refills | Status: DC

## 2019-02-17 NOTE — Progress Notes (Signed)
Patient ID: Emily Cannon, female   DOB: 08-28-1932, 85 y.o.   MRN: [id number]  HPI Sanaa is a 85yo F who is immunocompromised host with high dose steroids for vasculitis but also complicated by having recent CMV gastritis and herpes zoster reactivation. Still has healing wounds from zoster. Less abdominal discomfort. Steroids at 35mg  per day. Tolerating valcyte  Outpatient Encounter Medications as of 02/17/2019  Medication Sig  . amLODipine (NORVASC) 10 MG tablet TAKE ONE TABLET BY MOUTH DAILY  . atorvastatin (LIPITOR) 20 MG tablet TAKE ONE TABLET BY MOUTH DAILY  . calcitRIOL (ROCALTROL) 0.25 MCG capsule Take 0.25 mcg by mouth every Monday, Wednesday, and Friday.   . Cholecalciferol (VITAMIN D-3 PO) Take 1 capsule by mouth every Monday, Wednesday, and Friday.  . dapsone 100 MG tablet Take 100 mg by mouth every morning.   . furosemide (LASIX) 40 MG tablet Take 0.5 tablets (20 mg total) by mouth 3 (three) times a week. Monday, Wed, Friday (Patient taking differently: Take 40 mg by mouth every Monday, Wednesday, and Friday. )  . gemfibrozil (LOPID) 600 MG tablet TAKE ONE TABLET BY MOUTH DAILY  . glipiZIDE (GLUCOTROL) 5 MG tablet Take 2.5 mg by mouth daily before breakfast.  . hydrALAZINE (APRESOLINE) 50 MG tablet Take 1 tablet (50 mg total) by mouth 3 (three) times daily.  . hydrochlorothiazide (MICROZIDE) 12.5 MG capsule Take 12.5 mg by mouth daily.  . Hydrocortisone (GERHARDT'S BUTT CREAM) CREA Apply 1 application topically 3 (three) times daily. (Patient taking differently: Apply 1 application topically 3 (three) times daily. TO AFFECTED SITES)  . levothyroxine (SYNTHROID) 50 MCG tablet TAKE ONE TABLET BY MOUTH EVERY MORNING BEFORE BREAKFAST  . magnesium oxide (MAG-OX) 400 MG tablet Take 400 mg by mouth daily with breakfast.  . metoprolol succinate (TOPROL-XL) 50 MG 24 hr tablet TAKE ONE TABLET BY MOUTH TWICE A DAY (Patient taking differently: Take 50 mg by mouth 2 (two) times a day. )   . Multiple Vitamins-Minerals (CENTRUM SILVER 50+WOMEN) TABS Take 1 tablet by mouth daily with breakfast.  . predniSONE (DELTASONE) 20 MG tablet Take 3 tablets (60 mg total) by mouth daily before breakfast.  . valGANciclovir (VALCYTE) 450 MG tablet Take 1 tablet (450 mg total) by mouth every other day.  . pantoprazole (PROTONIX) 40 MG tablet Take 1 tablet (40 mg total) by mouth 2 (two) times daily for 28 days.  . pantoprazole (PROTONIX) 40 MG tablet Take 1 tablet (40 mg total) by mouth daily for 30 days. Start after twice daily dosing   No facility-administered encounter medications on file as of 02/17/2019.      Patient Active Problem List   Diagnosis Date Noted  . Heme positive stool   . Benign neoplasm of colon   . Acute blood loss anemia 12/30/2018  . Symptomatic anemia 12/15/2018  . AKI (acute kidney injury) ([place]) 12/15/2018  . Hypomagnesemia 12/15/2018  . Steroid-induced hyperglycemia 12/15/2018  . Microscopic polyangiitis ([place]) 11/13/2018  . Positive P-ANCA titer 10/14/2018  . Viral URI 05/12/2018  . Syncope 06/05/2016  . Unspecified venous (peripheral) insufficiency 11/23/2012  . Dermatitis 11/10/2012  . Hypothyroidism 05/22/2011  . TYMPANIC MEMBRANE PERFORATION, LEFT EAR 08/08/2007  . Osteopenia 07/27/2007  . Hyperlipidemia 05/11/2007  . Anxiety state 05/11/2007  . Essential hypertension 05/11/2007  . ALLERGIC RHINITIS 05/11/2007  . Irritable bowel syndrome 05/11/2007  . LOW BACK PAIN 05/11/2007  . HYSTERECTOMY, HX OF 05/11/2007     There are no preventive care reminders to display  for this patient.   Review of Systems  Physical Exam   BP (!) 157/57   Pulse 75   Temp 98 F (36.7 C) (Oral)   Wt 128 lb (58.1 kg)   BMI 23.41 kg/m    No results found for: CD4TCELL No results found for: CD4TABS No results found for: HIV1RNAQUANT No results found for: HEPBSAB No results found for: RPR, LABRPR  CBC Lab Results  Component Value Date   WBC 6.2 02/03/2019    RBC 2.34 (L) 02/03/2019   HGB 8.4 (L) 02/11/2019   HCT 23.9 (L) 02/03/2019   PLT 222 02/03/2019   MCV 102.1 (H) 02/03/2019   MCH 33.8 (H) 02/03/2019   MCHC 33.1 02/03/2019   RDW 17.1 (H) 02/03/2019   LYMPHSABS 769 (L) 02/03/2019   MONOABS 0.3 12/19/2018   EOSABS 31 02/03/2019    BMET Lab Results  Component Value Date   NA 140 02/03/2019   K 4.0 02/03/2019   CL 105 02/03/2019   CO2 26 02/03/2019   GLUCOSE 57 (L) 02/03/2019   BUN 29 (H) 02/03/2019   CREATININE 2.03 (H) 02/03/2019   CALCIUM 8.6 02/03/2019   GFRNONAA 20 (L) 01/03/2019   GFRAA 23 (L) 01/03/2019      Assessment and Plan  cmv gastritis = decision to how long to treat in part will deal with her immune suppression and if she can tolerate without much myelosuppression. Will need weekly labs and refills on valganciclovir  Will check cbc today  ckd 4= creatinine is stable, not worsening. Will continue with current valcyte dosing.  Plan to get labs this coming week

## 2019-02-18 LAB — CBC WITH DIFFERENTIAL/PLATELET
Absolute Monocytes: 347 cells/uL (ref 200–950)
Basophils Absolute: 51 cells/uL (ref 0–200)
Basophils Relative: 0.5 %
Eosinophils Absolute: 0 cells/uL — ABNORMAL LOW (ref 15–500)
Eosinophils Relative: 0 %
HCT: 28.3 % — ABNORMAL LOW (ref 35.0–45.0)
Hemoglobin: 9.5 g/dL — ABNORMAL LOW (ref 11.7–15.5)
Lymphs Abs: 867 cells/uL (ref 850–3900)
MCH: 33.6 pg — ABNORMAL HIGH (ref 27.0–33.0)
MCHC: 33.6 g/dL (ref 32.0–36.0)
MCV: 100 fL (ref 80.0–100.0)
MPV: 10.3 fL (ref 7.5–12.5)
Monocytes Relative: 3.4 %
Neutro Abs: 8935 cells/uL — ABNORMAL HIGH (ref 1500–7800)
Neutrophils Relative %: 87.6 %
Platelets: 304 10*3/uL (ref 140–400)
RBC: 2.83 10*6/uL — ABNORMAL LOW (ref 3.80–5.10)
RDW: 17 % — ABNORMAL HIGH (ref 11.0–15.0)
Total Lymphocyte: 8.5 %
WBC: 10.2 10*3/uL (ref 3.8–10.8)

## 2019-02-18 LAB — BASIC METABOLIC PANEL
BUN/Creatinine Ratio: 13 (calc) (ref 6–22)
BUN: 25 mg/dL (ref 7–25)
CO2: 22 mmol/L (ref 20–32)
Calcium: 8.5 mg/dL — ABNORMAL LOW (ref 8.6–10.4)
Chloride: 102 mmol/L (ref 98–110)
Creat: 1.99 mg/dL — ABNORMAL HIGH (ref 0.60–0.88)
Glucose, Bld: 203 mg/dL — ABNORMAL HIGH (ref 65–99)
Potassium: 4.2 mmol/L (ref 3.5–5.3)
Sodium: 137 mmol/L (ref 135–146)

## 2019-02-20 DIAGNOSIS — H5712 Ocular pain, left eye: Secondary | ICD-10-CM | POA: Diagnosis not present

## 2019-02-20 DIAGNOSIS — H353131 Nonexudative age-related macular degeneration, bilateral, early dry stage: Secondary | ICD-10-CM | POA: Diagnosis not present

## 2019-02-20 DIAGNOSIS — H35363 Drusen (degenerative) of macula, bilateral: Secondary | ICD-10-CM | POA: Diagnosis not present

## 2019-02-20 DIAGNOSIS — H35453 Secondary pigmentary degeneration, bilateral: Secondary | ICD-10-CM | POA: Diagnosis not present

## 2019-02-20 DIAGNOSIS — H1132 Conjunctival hemorrhage, left eye: Secondary | ICD-10-CM | POA: Diagnosis not present

## 2019-02-23 ENCOUNTER — Telehealth: Payer: Self-pay | Admitting: *Deleted

## 2019-02-23 NOTE — Telephone Encounter (Signed)
Patient husband called to report that since starting the Valcyte her glucose has been about 200-240. It is usually 85-88. To test his theory he skipped a dose and tested her blood sugar and it was back down to mid 80's. He wants to know if this is typical and let us know it is happening. Advised will check with the provider and give them a call back once she responds.

## 2019-02-24 ENCOUNTER — Ambulatory Visit: Payer: Self-pay | Admitting: *Deleted

## 2019-02-24 DIAGNOSIS — H1132 Conjunctival hemorrhage, left eye: Secondary | ICD-10-CM | POA: Diagnosis not present

## 2019-02-24 NOTE — Telephone Encounter (Signed)
Sounds good.  We will see her then.  May have benign subconjunctival hemorrhage.

## 2019-02-24 NOTE — Telephone Encounter (Signed)
Clinic RN did call to advise to go to ED. No answer and no answer machine

## 2019-02-24 NOTE — Telephone Encounter (Signed)
Please see message. I advised to Emily Cannon to go to ER and he stated that he did not think it was that bad, SOB comes and goes with her. He did stated that her eyes have hemorrhaged and that she had to go to the eye doctor today. Emily Cannon made an in office visit for Friday at 2:30pm. Emily Cannon verbalized an understanding.

## 2019-02-24 NOTE — Telephone Encounter (Addendum)
Patient's husband reporting patient complains of difficulty breathing, more than her usual. She has rare disorder affecting her blood and organs. No wheezing/no fever/no CP/no dizziness. He stated he does not want the appointment until tomorrow. Encouraged to call back if her breathing worsened. Attempted officeX2. No answer. Routing encounter for patient contact.  Attempted [facility] office to follow up on this encounter/appointmetn request. No answer.    Reason for Disposition . [1] MILD difficulty breathing (e.g., minimal/no SOB at rest, SOB with walking, pulse <100) AND [2] NEW-onset or WORSE than normal  Answer Assessment - Initial Assessment Questions 1. RESPIRATORY STATUS: "Describe your breathing?" (e.g., wheezing, shortness of breath, unable to speak, severe coughing)      Short of breath 2. ONSET: "When did this breathing problem begin?"      A few days 3. PATTERN "Does the difficult breathing come and go, or has it been constant since it started?"      constant 4. SEVERITY: "How bad is your breathing?" (e.g., mild, moderate, severe)    - MILD: No SOB at rest, mild SOB with walking, speaks normally in sentences, can lay down, no retractions, pulse < 100.    - MODERATE: SOB at rest, SOB with minimal exertion and prefers to sit, cannot lie down flat, speaks in phrases, mild retractions, audible wheezing, pulse 100-120.    - SEVERE: Very SOB at rest, speaks in single words, struggling to breathe, sitting hunched forward, retractions, pulse > 120      unsure 5. RECURRENT SYMPTOM: "Have you had difficulty breathing before?" If so, ask: "When was the last time?" and "What happened that time?"      yes 6. CARDIAC HISTORY: "Do you have any history of heart disease?" (e.g., heart attack, angina, bypass surgery, angioplasty)       7. LUNG HISTORY: "Do you have any history of lung disease?"  (e.g., pulmonary embolus, asthma, emphysema)     yes 8. CAUSE: "What do you think is causing the  breathing problem?"      Blood disorder 9. OTHER SYMPTOMS: "Do you have any other symptoms? (e.g., dizziness, runny nose, cough, chest pain, fever)     none 10. PREGNANCY: "Is there any chance you are pregnant?" "When was your last menstrual period?"       na 11. TRAVEL: "Have you traveled out of the country in the last month?" (e.g., travel history, exposures)      no  Protocols used: BREATHING DIFFICULTY-A-AH

## 2019-02-25 ENCOUNTER — Encounter: Payer: Self-pay | Admitting: Family Medicine

## 2019-02-25 ENCOUNTER — Encounter (HOSPITAL_COMMUNITY)
Admission: RE | Admit: 2019-02-25 | Discharge: 2019-02-25 | Disposition: A | Discharge status: Home / Self Care | Payer: Medicare Other | Source: Ambulatory Visit | Attending: Nephrology | Admitting: Nephrology

## 2019-02-25 ENCOUNTER — Other Ambulatory Visit: Payer: Self-pay

## 2019-02-25 VITALS — BP 130/48 | HR 76 | Temp 97.2°F | Resp 20

## 2019-02-25 DIAGNOSIS — E039 Hypothyroidism, unspecified: Secondary | ICD-10-CM | POA: Diagnosis not present

## 2019-02-25 DIAGNOSIS — D631 Anemia in chronic kidney disease: Secondary | ICD-10-CM | POA: Insufficient documentation

## 2019-02-25 DIAGNOSIS — K589 Irritable bowel syndrome without diarrhea: Secondary | ICD-10-CM | POA: Diagnosis not present

## 2019-02-25 DIAGNOSIS — N184 Chronic kidney disease, stage 4 (severe): Secondary | ICD-10-CM | POA: Diagnosis not present

## 2019-02-25 DIAGNOSIS — T380X5D Adverse effect of glucocorticoids and synthetic analogues, subsequent encounter: Secondary | ICD-10-CM | POA: Diagnosis not present

## 2019-02-25 DIAGNOSIS — M317 Microscopic polyangiitis: Secondary | ICD-10-CM | POA: Diagnosis not present

## 2019-02-25 DIAGNOSIS — J309 Allergic rhinitis, unspecified: Secondary | ICD-10-CM | POA: Diagnosis not present

## 2019-02-25 DIAGNOSIS — I872 Venous insufficiency (chronic) (peripheral): Secondary | ICD-10-CM | POA: Diagnosis not present

## 2019-02-25 DIAGNOSIS — I10 Essential (primary) hypertension: Secondary | ICD-10-CM | POA: Diagnosis not present

## 2019-02-25 DIAGNOSIS — M8580 Other specified disorders of bone density and structure, unspecified site: Secondary | ICD-10-CM | POA: Diagnosis not present

## 2019-02-25 DIAGNOSIS — D649 Anemia, unspecified: Secondary | ICD-10-CM | POA: Diagnosis not present

## 2019-02-25 DIAGNOSIS — M545 Low back pain: Secondary | ICD-10-CM | POA: Diagnosis not present

## 2019-02-25 DIAGNOSIS — E099 Drug or chemical induced diabetes mellitus without complications: Secondary | ICD-10-CM | POA: Diagnosis not present

## 2019-02-25 DIAGNOSIS — E7800 Pure hypercholesterolemia, unspecified: Secondary | ICD-10-CM | POA: Diagnosis not present

## 2019-02-25 LAB — IRON AND TIBC
Iron: 33 ug/dL (ref 28–170)
Saturation Ratios: 15 % (ref 10.4–31.8)
TIBC: 225 ug/dL — ABNORMAL LOW (ref 250–450)
UIBC: 192 ug/dL

## 2019-02-25 LAB — FERRITIN: Ferritin: 3407 ng/mL — ABNORMAL HIGH (ref 11–307)

## 2019-02-25 LAB — POCT HEMOGLOBIN-HEMACUE: Hemoglobin: 10 g/dL — ABNORMAL LOW (ref 12.0–15.0)

## 2019-02-25 MED ORDER — EPOETIN ALFA-EPBX 10000 UNIT/ML IJ SOLN
30000.0000 [IU] | Freq: Once | INTRAMUSCULAR | Status: AC
  Administered 2019-02-25: 30000 [IU] via SUBCUTANEOUS
  Filled 2019-02-25: qty 3

## 2019-02-25 MED ORDER — EPOETIN ALFA-EPBX 40000 UNIT/ML IJ SOLN: 30000.0000 [IU] | INTRAMUSCULAR | Status: DC

## 2019-02-26 ENCOUNTER — Other Ambulatory Visit: Payer: Self-pay

## 2019-02-26 ENCOUNTER — Encounter: Payer: Self-pay | Admitting: Family Medicine

## 2019-02-26 ENCOUNTER — Ambulatory Visit: Payer: Medicare Other | Admitting: Family Medicine

## 2019-02-26 VITALS — BP 130/70 | HR 78 | Temp 97.9°F | Wt 126.8 lb

## 2019-02-26 DIAGNOSIS — E039 Hypothyroidism, unspecified: Secondary | ICD-10-CM | POA: Diagnosis not present

## 2019-02-26 DIAGNOSIS — R5381 Other malaise: Secondary | ICD-10-CM

## 2019-02-26 DIAGNOSIS — M317 Microscopic polyangiitis: Secondary | ICD-10-CM | POA: Diagnosis not present

## 2019-02-26 DIAGNOSIS — R0600 Dyspnea, unspecified: Secondary | ICD-10-CM | POA: Diagnosis not present

## 2019-02-26 DIAGNOSIS — M6281 Muscle weakness (generalized): Secondary | ICD-10-CM

## 2019-02-26 NOTE — Patient Instructions (Signed)
We will set up further home PT.

## 2019-02-26 NOTE — Patient Outreach (Signed)
Emily Cannon) Care Management  02/26/2019  Emily Cannon 06/17/1933 [id number]   Medication Adherence call to Emily Cannon HIPPA Compliant Voice message left with a call back number. Emily Cannon is showing past due on Glipizide 5 mg under Emily Cannon.   [place] Management Direct Dial [phone]  Fax [phone] Emily Cannon

## 2019-02-26 NOTE — Progress Notes (Signed)
Subjective:     Patient ID: Emily Cannon, female   DOB: Jul 24, 1933, 85 y.o.   MRN: [id number]  HPI Patient relates some shortness of breath.  This is not so much at rest but more with exertion.  No chest pain.  No fever.  No cough.  Her history is that she had presented here with lower extremity petechiae which led to further evaluation.  We ended up diagnosing microscopic polyangiitis.  She has had multiple complications including acute renal failure and is followed closely by nephrology and rheumatology.  She is currently on prednisone 25 mg daily but tapering down.  She had bump in blood sugars when taking high-dose prednisone but blood sugars been stable since coming down.  She is currently on 25 mg prednisone daily and going down by 5 mg/week.  She denies any orthopnea.  She has some peripheral edema is on furosemide but her edema is stable.  She uses support hose.  No pleuritic pain.  Symptoms are relatively mild.  She has been getting home physical therapy and does feel she is progressing some with strengthening.  She has had weakness especially with hip flexors bilaterally.  She would like to continue physical therapy for a while longer.  Needs further orders to continue.  She had recent labs and these were reviewed.  Creatinine 1.9 which is improving.  Hemoglobin 10.0.  She has hypothyroidism and is overdue for follow-up TSH.  She had recent left subconjunctival hemorrhage.  She saw ophthalmologist and was given some reassurance.  No blurred vision.  Past Medical History:  Diagnosis Date  . Allergic rhinitis, cause unspecified   . Anxiety state, unspecified   . Disorder of bone and cartilage, unspecified   . Hypothyroid   . Irritable bowel syndrome   . Lumbago   . Other and unspecified hyperlipidemia   . Perforation of tympanic membrane, unspecified   . Unspecified essential hypertension    Past Surgical History:  Procedure Laterality Date  . ABDOMINAL HYSTERECTOMY    .  BIOPSY  01/02/2019   Procedure: BIOPSY;  Surgeon: Yetta Flock, MD;  Location: [facility] ENDOSCOPY;  Service: Gastroenterology;;  . cataract surgery  5/12 and 12/24/2012   both eyes  . COLONOSCOPY WITH PROPOFOL N/A 01/02/2019   Procedure: COLONOSCOPY WITH PROPOFOL;  Surgeon: Yetta Flock, MD;  Location: [facility];  Service: Gastroenterology;  Laterality: N/A;  . ESOPHAGOGASTRODUODENOSCOPY (EGD) WITH PROPOFOL N/A 01/02/2019   Procedure: ESOPHAGOGASTRODUODENOSCOPY (EGD) WITH PROPOFOL;  Surgeon: Yetta Flock, MD;  Location: [facility];  Service: Gastroenterology;  Laterality: N/A;  . POLYPECTOMY  01/02/2019   Procedure: POLYPECTOMY;  Surgeon: Yetta Flock, MD;  Location[facility] ENDOSCOPY;  Service: Gastroenterology;;  . TONSILLECTOMY      reports that she has never smoked. She has never used smokeless tobacco. She reports that she does not drink alcohol or use drugs. family history includes COPD in an other family member; Lung cancer (age of onset: 21) in her mother; Lung disease (age of onset: 53) in her father. Allergies  Allergen Reactions  . Alendronate Sodium Shortness Of Breath  . Amoxicillin Diarrhea    Severe diarrhea  . Codeine Nausea Only  . Valsartan Swelling    Gums/throat swell     Review of Systems  Constitutional: Negative for chills and fever.  Respiratory: Positive for shortness of breath. Negative for cough and wheezing.   Cardiovascular: Negative for chest pain and palpitations.  Gastrointestinal: Negative for abdominal pain, nausea and vomiting.  Genitourinary:  Negative for dysuria.  Neurological: Positive for weakness. Negative for dizziness and syncope.  Psychiatric/Behavioral: Negative for confusion.       Objective:   Physical Exam Constitutional:      Appearance: She is well-developed.  Eyes:     Comments: She has left subconjunctival hemorrhage  Cardiovascular:     Rate and Rhythm: Normal rate and regular rhythm.  Pulmonary:      Effort: Pulmonary effort is normal.     Breath sounds: Normal breath sounds. No decreased breath sounds.  Musculoskeletal:     Comments: She has support hose on bilaterally.  She does have some mild pitting edema even with the support hose.  Neurological:     General: No focal deficit present.     Mental Status: She is alert.        Assessment:     #1 dyspnea.  She is describing several weeks of some mild dyspnea on exertion.  Suspect this is largely related to deconditioning in combination with her anemia-though this is slowly improving  #2 hypothyroidism.  She is overdue for thyroid studies  #3 generalized weakness and deconditioning  #4 recent benign left subconjunctival hemorrhage  #5 microscopic polyangiitis followed by rheumatology And renal involvement followed by nephrology    Plan:     -We will set up continuation referral for home PT.  She still has considerable weakness lower extremities though slowly improving -Repeat TSH -Follow-up immediately for any fever, cough, or any other new symptoms  Eulas Post MD [facility]

## 2019-02-27 LAB — TSH: TSH: 1.66 mIU/L (ref 0.40–4.50)

## 2019-03-05 ENCOUNTER — Ambulatory Visit (INDEPENDENT_AMBULATORY_CARE_PROVIDER_SITE_OTHER): Payer: Medicare Other | Admitting: Family Medicine

## 2019-03-05 ENCOUNTER — Ambulatory Visit: Payer: Self-pay | Admitting: Family Medicine

## 2019-03-05 ENCOUNTER — Other Ambulatory Visit: Payer: Self-pay

## 2019-03-05 DIAGNOSIS — R05 Cough: Secondary | ICD-10-CM

## 2019-03-05 DIAGNOSIS — M317 Microscopic polyangiitis: Secondary | ICD-10-CM | POA: Diagnosis not present

## 2019-03-05 DIAGNOSIS — R059 Cough, unspecified: Secondary | ICD-10-CM

## 2019-03-05 MED ORDER — CEFUROXIME AXETIL 250 MG PO TABS: 250.0000 mg | ORAL_TABLET | Freq: Two times a day (BID) | ORAL | 0 refills | Status: DC

## 2019-03-05 NOTE — Telephone Encounter (Signed)
See note

## 2019-03-05 NOTE — Progress Notes (Signed)
This visit type was conducted due to national recommendations for restrictions regarding the COVID-19 pandemic in an effort to limit this patient's exposure and mitigate transmission in our community.   Virtual Visit via Telephone Note  I connected with Emily Cannon on 03/05/19 at  2:00 PM EDT by telephone and verified that I am speaking with the correct person using two identifiers.   I discussed the limitations, risks, security and privacy concerns of performing an evaluation and management service by telephone and the availability of in person appointments. I also discussed with the patient that there may be a patient responsible charge related to this service. The patient expressed understanding and agreed to proceed.  Location patient: home Location provider: work or home office Participants present for the call: patient, provider Patient did not have a visit in the prior 7 days to address this/these issue(s).   History of Present Illness:  Patient diagnosed several months ago with microscopic polyangiitis.  She presented with multisystem involvement clearing respiratory symptoms.  She is followed closely by nephrology and rheumatology.  Currently on prednisone 25 mg daily.  They called and today with some increased cough productive mostly of clear sputum past several days.  No fever.  No chills.  Patient states her symptoms are worse when she is lying flat.  They state her weight is stable at 126 pounds.  Her leg edema is actually improved.  No chest pain.  No pleuritic pain.  Other medical problems include hypertension, IBS, hypothyroidism, osteopenia, anemia.  Patient is non-smoker.  Allergy to penicillin but has tolerated cephalosporins without difficulty.  She has no true allergy to penicillin but sounds like she had diarrhea with amoxicillin.  She has been very isolated (other than office visits) and no known sick exposures.   Observations/Objective: Patient sounds cheerful and  well on the phone. I do not appreciate any SOB. Speech and thought processing are grossly intact. Patient reported vitals:  Assessment and Plan:  Cough.  Patient does not report any fevers, hemoptysis, weight loss.  Differential is broad with her comorbidities as above.  High risk for complications  -Recommend consider over-the-counter plain Mucinex twice daily -Plenty of fluids and rest -Monitor daily weights closely -Follow-up immediately for any fever or increased shortness of breath -We decided to go and cover with Ceftin 250 mg twice daily for 7 days -We will need chest x-ray and further evaluation if not improving over the next few days  Follow Up Instructions:  -As above   99441 5-10 [id number] 11-20 [id number] 21-30 I did not refer this patient for an OV in the next 24 hours for this/these issue(s).  I discussed the assessment and treatment plan with the patient. The patient was provided an opportunity to ask questions and all were answered. The patient agreed with the plan and demonstrated an understanding of the instructions.   The patient was advised to call back or seek an in-person evaluation if the symptoms worsen or if the condition fails to improve as anticipated.  I provided 25 minutes of non-face-to-face time during this encounter.   Carolann Littler, MD

## 2019-03-05 NOTE — Telephone Encounter (Signed)
  Husband called in but Ramina there with him.   She is c/o having mucus in her lungs and wheezing especially at night for the last week.   She has a blood disorder so he is concerned about her getting worse and wants to see Dr. Elease Hashimoto this morning.  I sent the note over high priority so the office would have it when they opened.   Husband was agreeable to them calling back.   "I will call back close to 8:00 also.   Reason for Disposition . [1] MILD difficulty breathing (e.g., minimal/no SOB at rest, SOB with walking, pulse <100) AND [2] NEW-onset or WORSE than normal    Pt is having a build up of mucus with wheezing especially at night.  Answer Assessment - Initial Assessment Questions 1. RESPIRATORY STATUS: "Describe your breathing?" (e.g., wheezing, shortness of breath, unable to speak, severe coughing)      Husband, [name] calling in for wife.    She is wheezing and having a lot of mucus.   She can't use OTC medicines due to hypertension.   She is having difficulty breathing. 2. ONSET: "When did this breathing problem begin?"      A week ago 3. PATTERN "Does the difficult breathing come and go, or has it been constant since it started?"      Worse at night 4. SEVERITY: "How bad is your breathing?" (e.g., mild, moderate, severe)    - MILD: No SOB at rest, mild SOB with walking, speaks normally in sentences, can lay down, no retractions, pulse < 100.    - MODERATE: SOB at rest, SOB with minimal exertion and prefers to sit, cannot lie down flat, speaks in phrases, mild retractions, audible wheezing, pulse 100-120.    - SEVERE: Very SOB at rest, speaks in single words, struggling to breathe, sitting hunched forward, retractions, pulse > 120      Wheezing 5. RECURRENT SYMPTOM: "Have you had difficulty breathing before?" If so, ask: "When was the last time?" and "What happened that time?"      No    She was very healthy  until March 4 she has a rare blood disorder. 6. CARDIAC HISTORY: "Do you  have any history of heart disease?" (e.g., heart attack, angina, bypass surgery, angioplasty)      No 7. LUNG HISTORY: "Do you have any history of lung disease?"  (e.g., pulmonary embolus, asthma, emphysema)     No 8. CAUSE: "What do you think is causing the breathing problem?"      I think the mucus. 9. OTHER SYMPTOMS: "Do you have any other symptoms? (e.g., dizziness, runny nose, cough, chest pain, fever)     None 10. PREGNANCY: "Is there any chance you are pregnant?" "When was your last menstrual period?"       N/A 11. TRAVEL: "Have you traveled out of the country in the last month?" (e.g., travel history, exposures)       No travels  Protocols used: BREATHING DIFFICULTY-A-AH

## 2019-03-05 NOTE — Telephone Encounter (Signed)
Please see message. Updated med list attached to [facility]

## 2019-03-05 NOTE — Telephone Encounter (Signed)
Patient scheduled to see Dr. Elease Hashimoto at [facility] today

## 2019-03-09 DIAGNOSIS — D631 Anemia in chronic kidney disease: Secondary | ICD-10-CM | POA: Diagnosis not present

## 2019-03-09 DIAGNOSIS — E7800 Pure hypercholesterolemia, unspecified: Secondary | ICD-10-CM | POA: Diagnosis not present

## 2019-03-09 DIAGNOSIS — M2351 Chronic instability of knee, right knee: Secondary | ICD-10-CM | POA: Diagnosis not present

## 2019-03-09 DIAGNOSIS — M317 Microscopic polyangiitis: Secondary | ICD-10-CM | POA: Diagnosis not present

## 2019-03-09 DIAGNOSIS — M8580 Other specified disorders of bone density and structure, unspecified site: Secondary | ICD-10-CM | POA: Diagnosis not present

## 2019-03-09 DIAGNOSIS — I129 Hypertensive chronic kidney disease with stage 1 through stage 4 chronic kidney disease, or unspecified chronic kidney disease: Secondary | ICD-10-CM | POA: Diagnosis not present

## 2019-03-09 DIAGNOSIS — I872 Venous insufficiency (chronic) (peripheral): Secondary | ICD-10-CM | POA: Diagnosis not present

## 2019-03-09 DIAGNOSIS — Z9181 History of falling: Secondary | ICD-10-CM | POA: Diagnosis not present

## 2019-03-09 DIAGNOSIS — N184 Chronic kidney disease, stage 4 (severe): Secondary | ICD-10-CM | POA: Diagnosis not present

## 2019-03-09 DIAGNOSIS — H7290 Unspecified perforation of tympanic membrane, unspecified ear: Secondary | ICD-10-CM | POA: Diagnosis not present

## 2019-03-09 DIAGNOSIS — E0922 Drug or chemical induced diabetes mellitus with diabetic chronic kidney disease: Secondary | ICD-10-CM | POA: Diagnosis not present

## 2019-03-09 DIAGNOSIS — T380X5D Adverse effect of glucocorticoids and synthetic analogues, subsequent encounter: Secondary | ICD-10-CM | POA: Diagnosis not present

## 2019-03-09 DIAGNOSIS — K589 Irritable bowel syndrome without diarrhea: Secondary | ICD-10-CM | POA: Diagnosis not present

## 2019-03-09 DIAGNOSIS — Z7984 Long term (current) use of oral hypoglycemic drugs: Secondary | ICD-10-CM | POA: Diagnosis not present

## 2019-03-09 DIAGNOSIS — Z7952 Long term (current) use of systemic steroids: Secondary | ICD-10-CM | POA: Diagnosis not present

## 2019-03-09 DIAGNOSIS — E039 Hypothyroidism, unspecified: Secondary | ICD-10-CM | POA: Diagnosis not present

## 2019-03-10 ENCOUNTER — Ambulatory Visit: Payer: Medicare Other | Admitting: Internal Medicine

## 2019-03-10 ENCOUNTER — Other Ambulatory Visit: Payer: Self-pay

## 2019-03-10 VITALS — BP 116/65 | HR 81

## 2019-03-10 DIAGNOSIS — B259 Cytomegaloviral disease, unspecified: Secondary | ICD-10-CM | POA: Diagnosis not present

## 2019-03-10 DIAGNOSIS — A084 Viral intestinal infection, unspecified: Secondary | ICD-10-CM

## 2019-03-10 DIAGNOSIS — N184 Chronic kidney disease, stage 4 (severe): Secondary | ICD-10-CM

## 2019-03-10 LAB — IRON: Iron: 19 ug/dL — ABNORMAL LOW (ref 45–160)

## 2019-03-10 NOTE — Progress Notes (Signed)
RFV: follow up cmv gastritis  Patient ID: Emily Cannon, female   DOB: 05-Apr-1933, 85 y.o.   MRN: [id number]  HPI Emily Cannon is a 85yo F with P-anca+ microscopic polyangiitis involving lung-kidneys diagnosed in early 2020, currently on pred 35mg  TIW followed by rheumatology. She recently was admitted for GI bleed and found to have gastric ulcer, path + CMV with concurrent flare of shingles. She states that she is doing well. Not having issues with valganciclovir which she is taking TIW. She noticed having some fluctuation with her BS while taking valcyte.  ROS: no diarrhea, decrease swelling to legs, 12 point ros is otherwise negative  Outpatient Encounter Medications as of 03/10/2019  Medication Sig  . amLODipine (NORVASC) 10 MG tablet TAKE ONE TABLET BY MOUTH DAILY  . atorvastatin (LIPITOR) 20 MG tablet TAKE ONE TABLET BY MOUTH DAILY  . calcitRIOL (ROCALTROL) 0.25 MCG capsule Take 0.25 mcg by mouth every Monday, Wednesday, and Friday.   . cefUROXime (CEFTIN) 250 MG tablet Take 1 tablet (250 mg total) by mouth 2 (two) times daily with a meal.  . Cholecalciferol (VITAMIN D-3 PO) Take 1 capsule by mouth every Monday, Wednesday, and Friday.  . dapsone 100 MG tablet Take 100 mg by mouth every morning.   . furosemide (LASIX) 40 MG tablet Take 0.5 tablets (20 mg total) by mouth 3 (three) times a week. Monday, Wed, Friday (Patient taking differently: Take 40 mg by mouth every Monday, Wednesday, and Friday. )  . gemfibrozil (LOPID) 600 MG tablet TAKE ONE TABLET BY MOUTH DAILY  . hydrALAZINE (APRESOLINE) 50 MG tablet Take 1 tablet (50 mg total) by mouth 3 (three) times daily.  . hydrochlorothiazide (MICROZIDE) 12.5 MG capsule Take 12.5 mg by mouth daily.  . Hydrocortisone (GERHARDT'S BUTT CREAM) CREA Apply 1 application topically 3 (three) times daily. (Patient taking differently: Apply 1 application topically 3 (three) times daily. TO AFFECTED SITES)  . levothyroxine (SYNTHROID) 50 MCG tablet TAKE ONE  TABLET BY MOUTH EVERY MORNING BEFORE BREAKFAST  . magnesium oxide (MAG-OX) 400 MG tablet Take 400 mg by mouth daily with breakfast.  . metoprolol succinate (TOPROL-XL) 50 MG 24 hr tablet TAKE ONE TABLET BY MOUTH TWICE A DAY (Patient taking differently: Take 50 mg by mouth 2 (two) times a day. )  . Multiple Vitamins-Minerals (CENTRUM SILVER 50+WOMEN) TABS Take 1 tablet by mouth daily with breakfast.  . pantoprazole (PROTONIX) 40 MG tablet Take 1 tablet (40 mg total) by mouth 2 (two) times daily for 28 days.  . predniSONE (DELTASONE) 20 MG tablet Take 3 tablets (60 mg total) by mouth daily before breakfast.  . valGANciclovir (VALCYTE) 450 MG tablet Take 1 tablet (450 mg total) by mouth every other day.   No facility-administered encounter medications on file as of 03/10/2019.      Patient Active Problem List   Diagnosis Date Noted  . Heme positive stool   . Benign neoplasm of colon   . Acute blood loss anemia 12/30/2018  . Symptomatic anemia 12/15/2018  . AKI (acute kidney injury) ([place]) 12/15/2018  . Hypomagnesemia 12/15/2018  . Steroid-induced hyperglycemia 12/15/2018  . Microscopic polyangiitis ([place]) 11/13/2018  . Positive P-ANCA titer 10/14/2018  . Viral URI 05/12/2018  . Syncope 06/05/2016  . Unspecified venous (peripheral) insufficiency 11/23/2012  . Dermatitis 11/10/2012  . Hypothyroidism 05/22/2011  . TYMPANIC MEMBRANE PERFORATION, LEFT EAR 08/08/2007  . Osteopenia 07/27/2007  . Hyperlipidemia 05/11/2007  . Anxiety state 05/11/2007  . Essential hypertension 05/11/2007  . ALLERGIC RHINITIS 05/11/2007  .  Irritable bowel syndrome 05/11/2007  . LOW BACK PAIN 05/11/2007  . HYSTERECTOMY, HX OF 05/11/2007     Health Maintenance Due  Topic Date Due  . INFLUENZA VACCINE  02/27/2019     Review of Systems +arthritis, fatigue, easy bruising, swelling to lower extremities, otherwise 12 point ros is negative Physical Exam   BP 116/65   Pulse 81   SpO2 95%  Physical Exam   Constitutional:  oriented to person, place, and time. appears well-developed and well-nourished. No distress.  HENT: [place]/AT, PERRLA, no scleral icterus Mouth/Throat: Oropharynx is clear and moist. No oropharyngeal exudate.  Cardiovascular: Normal rate, regular rhythm and normal heart sounds. Exam reveals no gallop and no friction rub.  No murmur heard.  Pulmonary/Chest: Effort normal and breath sounds normal. No respiratory distress.  has no wheezes.  Neck = supple, no nuchal rigidity Abdominal: Soft. Bowel sounds are normal.  exhibits no distension. There is no tenderness.  Lymphadenopathy: no cervical adenopathy. No axillary adenopathy Neurological: alert and oriented to person, place, and time.  Skin: Skin is warm and dry. No rash noted. No erythema. Ext: +trace   Psychiatric: a normal mood and affect.  behavior is normal.   CBC Lab Results  Component Value Date   WBC 10.2 02/17/2019   RBC 2.83 (L) 02/17/2019   HGB 10.0 (L) 02/25/2019   HCT 28.3 (L) 02/17/2019   PLT 304 02/17/2019   MCV 100.0 02/17/2019   MCH 33.6 (H) 02/17/2019   MCHC 33.6 02/17/2019   RDW 17.0 (H) 02/17/2019   LYMPHSABS 867 02/17/2019   MONOABS 0.3 12/19/2018   EOSABS 0 (L) 02/17/2019    BMET Lab Results  Component Value Date   NA 137 02/17/2019   K 4.2 02/17/2019   CL 102 02/17/2019   CO2 22 02/17/2019   GLUCOSE 203 (H) 02/17/2019   BUN 25 02/17/2019   CREATININE 1.99 (H) 02/17/2019   CALCIUM 8.5 (L) 02/17/2019   GFRNONAA 20 (L) 01/03/2019   GFRAA 23 (L) 01/03/2019      Assessment and Plan  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 Appears to tolerate at this time. Will check cmv vl at next visit. Discuss with dr Havery Moros if need to repeat EGD  ckd 5= continue to dose adjust valcyte

## 2019-03-11 ENCOUNTER — Encounter (HOSPITAL_COMMUNITY): Payer: Medicare Other

## 2019-03-11 ENCOUNTER — Ambulatory Visit (HOSPITAL_COMMUNITY)
Admission: RE | Admit: 2019-03-11 | Discharge: 2019-03-11 | Disposition: A | Discharge status: Home / Self Care | Payer: Medicare Other | Source: Ambulatory Visit | Attending: Nephrology | Admitting: Nephrology

## 2019-03-11 VITALS — BP 124/56 | HR 73 | Temp 96.1°F | Resp 20

## 2019-03-11 DIAGNOSIS — N184 Chronic kidney disease, stage 4 (severe): Secondary | ICD-10-CM | POA: Insufficient documentation

## 2019-03-11 DIAGNOSIS — I129 Hypertensive chronic kidney disease with stage 1 through stage 4 chronic kidney disease, or unspecified chronic kidney disease: Secondary | ICD-10-CM | POA: Diagnosis not present

## 2019-03-11 DIAGNOSIS — D631 Anemia in chronic kidney disease: Secondary | ICD-10-CM | POA: Diagnosis not present

## 2019-03-11 DIAGNOSIS — D649 Anemia, unspecified: Secondary | ICD-10-CM

## 2019-03-11 DIAGNOSIS — R801 Persistent proteinuria, unspecified: Secondary | ICD-10-CM | POA: Diagnosis not present

## 2019-03-11 DIAGNOSIS — N2581 Secondary hyperparathyroidism of renal origin: Secondary | ICD-10-CM | POA: Diagnosis not present

## 2019-03-11 LAB — POCT HEMOGLOBIN-HEMACUE: Hemoglobin: 11.7 g/dL — ABNORMAL LOW (ref 12.0–15.0)

## 2019-03-11 MED ORDER — EPOETIN ALFA-EPBX 10000 UNIT/ML IJ SOLN
30000.0000 [IU] | INTRAMUSCULAR | Status: DC
  Administered 2019-03-11: 10:00:00 30000 [IU] via SUBCUTANEOUS
  Filled 2019-03-11: qty 3

## 2019-03-12 ENCOUNTER — Other Ambulatory Visit: Payer: Self-pay

## 2019-03-12 NOTE — Patient Outreach (Signed)
[facility]) Care Management  03/12/2019  Emily Cannon 11-22-1932 [id number]   Medication Adherence call to Emily Cannon [facility] Identifiers Verify spoke with patient she is past due on Glipizide 5 mg patient explain she is not taking this medication for diabetes she is no longer taking this medication.Emily Cannon is showing past due under [place].   [place] Management Direct Dial [phone]  Fax [phone] [email or web address]

## 2019-03-17 ENCOUNTER — Telehealth: Payer: Self-pay | Admitting: *Deleted

## 2019-03-17 DIAGNOSIS — Z7952 Long term (current) use of systemic steroids: Secondary | ICD-10-CM | POA: Diagnosis not present

## 2019-03-17 DIAGNOSIS — Z7984 Long term (current) use of oral hypoglycemic drugs: Secondary | ICD-10-CM | POA: Diagnosis not present

## 2019-03-17 DIAGNOSIS — I872 Venous insufficiency (chronic) (peripheral): Secondary | ICD-10-CM | POA: Diagnosis not present

## 2019-03-17 DIAGNOSIS — M317 Microscopic polyangiitis: Secondary | ICD-10-CM | POA: Diagnosis not present

## 2019-03-17 DIAGNOSIS — M2351 Chronic instability of knee, right knee: Secondary | ICD-10-CM | POA: Diagnosis not present

## 2019-03-17 DIAGNOSIS — E7800 Pure hypercholesterolemia, unspecified: Secondary | ICD-10-CM | POA: Diagnosis not present

## 2019-03-17 DIAGNOSIS — E0922 Drug or chemical induced diabetes mellitus with diabetic chronic kidney disease: Secondary | ICD-10-CM | POA: Diagnosis not present

## 2019-03-17 DIAGNOSIS — Z9181 History of falling: Secondary | ICD-10-CM | POA: Diagnosis not present

## 2019-03-17 DIAGNOSIS — H7290 Unspecified perforation of tympanic membrane, unspecified ear: Secondary | ICD-10-CM | POA: Diagnosis not present

## 2019-03-17 DIAGNOSIS — I129 Hypertensive chronic kidney disease with stage 1 through stage 4 chronic kidney disease, or unspecified chronic kidney disease: Secondary | ICD-10-CM | POA: Diagnosis not present

## 2019-03-17 DIAGNOSIS — K589 Irritable bowel syndrome without diarrhea: Secondary | ICD-10-CM | POA: Diagnosis not present

## 2019-03-17 DIAGNOSIS — D631 Anemia in chronic kidney disease: Secondary | ICD-10-CM | POA: Diagnosis not present

## 2019-03-17 DIAGNOSIS — M8580 Other specified disorders of bone density and structure, unspecified site: Secondary | ICD-10-CM | POA: Diagnosis not present

## 2019-03-17 DIAGNOSIS — E039 Hypothyroidism, unspecified: Secondary | ICD-10-CM | POA: Diagnosis not present

## 2019-03-17 DIAGNOSIS — T380X5D Adverse effect of glucocorticoids and synthetic analogues, subsequent encounter: Secondary | ICD-10-CM | POA: Diagnosis not present

## 2019-03-17 DIAGNOSIS — N184 Chronic kidney disease, stage 4 (severe): Secondary | ICD-10-CM | POA: Diagnosis not present

## 2019-03-18 ENCOUNTER — Other Ambulatory Visit: Payer: Medicare Other

## 2019-03-18 ENCOUNTER — Other Ambulatory Visit: Payer: Self-pay

## 2019-03-18 DIAGNOSIS — B259 Cytomegaloviral disease, unspecified: Secondary | ICD-10-CM | POA: Diagnosis not present

## 2019-03-18 DIAGNOSIS — A084 Viral intestinal infection, unspecified: Secondary | ICD-10-CM

## 2019-03-18 NOTE — Telephone Encounter (Signed)
Per Dr Baxter Flattery, patient needs to stop valycte and come in for repeat bmp (order placed).  Patient takes valcyte Monday/Wednesday/Friday. She will hold Wednesday's dose, will come in Thursday for labs. Landis Gandy, RN

## 2019-03-19 LAB — CBC WITH DIFFERENTIAL/PLATELET
Absolute Monocytes: 1439 cells/uL — ABNORMAL HIGH (ref 200–950)
Basophils Absolute: 40 cells/uL (ref 0–200)
Basophils Relative: 0.3 %
Eosinophils Absolute: 53 cells/uL (ref 15–500)
Eosinophils Relative: 0.4 %
HCT: 35.7 % (ref 35.0–45.0)
Hemoglobin: 11.5 g/dL — ABNORMAL LOW (ref 11.7–15.5)
Lymphs Abs: 1808 cells/uL (ref 850–3900)
MCH: 30.7 pg (ref 27.0–33.0)
MCHC: 32.2 g/dL (ref 32.0–36.0)
MCV: 95.5 fL (ref 80.0–100.0)
MPV: 9.6 fL (ref 7.5–12.5)
Monocytes Relative: 10.9 %
Neutro Abs: 9860 cells/uL — ABNORMAL HIGH (ref 1500–7800)
Neutrophils Relative %: 74.7 %
Platelets: 242 10*3/uL (ref 140–400)
RBC: 3.74 10*6/uL — ABNORMAL LOW (ref 3.80–5.10)
RDW: 16.4 % — ABNORMAL HIGH (ref 11.0–15.0)
Total Lymphocyte: 13.7 %
WBC: 13.2 10*3/uL — ABNORMAL HIGH (ref 3.8–10.8)

## 2019-03-19 LAB — BASIC METABOLIC PANEL
BUN/Creatinine Ratio: 13 (calc) (ref 6–22)
BUN: 27 mg/dL — ABNORMAL HIGH (ref 7–25)
CO2: 26 mmol/L (ref 20–32)
Calcium: 9.2 mg/dL (ref 8.6–10.4)
Chloride: 102 mmol/L (ref 98–110)
Creat: 2.06 mg/dL — ABNORMAL HIGH (ref 0.60–0.88)
Glucose, Bld: 120 mg/dL — ABNORMAL HIGH (ref 65–99)
Potassium: 4.3 mmol/L (ref 3.5–5.3)
Sodium: 139 mmol/L (ref 135–146)

## 2019-03-21 ENCOUNTER — Other Ambulatory Visit: Payer: Self-pay | Admitting: Family Medicine

## 2019-03-22 DIAGNOSIS — E0922 Drug or chemical induced diabetes mellitus with diabetic chronic kidney disease: Secondary | ICD-10-CM | POA: Diagnosis not present

## 2019-03-22 DIAGNOSIS — H7290 Unspecified perforation of tympanic membrane, unspecified ear: Secondary | ICD-10-CM | POA: Diagnosis not present

## 2019-03-22 DIAGNOSIS — E039 Hypothyroidism, unspecified: Secondary | ICD-10-CM | POA: Diagnosis not present

## 2019-03-22 DIAGNOSIS — Z7984 Long term (current) use of oral hypoglycemic drugs: Secondary | ICD-10-CM | POA: Diagnosis not present

## 2019-03-22 DIAGNOSIS — Z9181 History of falling: Secondary | ICD-10-CM | POA: Diagnosis not present

## 2019-03-22 DIAGNOSIS — T380X5D Adverse effect of glucocorticoids and synthetic analogues, subsequent encounter: Secondary | ICD-10-CM | POA: Diagnosis not present

## 2019-03-22 DIAGNOSIS — M2351 Chronic instability of knee, right knee: Secondary | ICD-10-CM | POA: Diagnosis not present

## 2019-03-22 DIAGNOSIS — K589 Irritable bowel syndrome without diarrhea: Secondary | ICD-10-CM | POA: Diagnosis not present

## 2019-03-22 DIAGNOSIS — M317 Microscopic polyangiitis: Secondary | ICD-10-CM | POA: Diagnosis not present

## 2019-03-22 DIAGNOSIS — E7800 Pure hypercholesterolemia, unspecified: Secondary | ICD-10-CM | POA: Diagnosis not present

## 2019-03-22 DIAGNOSIS — M8580 Other specified disorders of bone density and structure, unspecified site: Secondary | ICD-10-CM | POA: Diagnosis not present

## 2019-03-22 DIAGNOSIS — Z7952 Long term (current) use of systemic steroids: Secondary | ICD-10-CM | POA: Diagnosis not present

## 2019-03-22 DIAGNOSIS — I872 Venous insufficiency (chronic) (peripheral): Secondary | ICD-10-CM | POA: Diagnosis not present

## 2019-03-22 DIAGNOSIS — N184 Chronic kidney disease, stage 4 (severe): Secondary | ICD-10-CM | POA: Diagnosis not present

## 2019-03-22 DIAGNOSIS — D631 Anemia in chronic kidney disease: Secondary | ICD-10-CM | POA: Diagnosis not present

## 2019-03-22 DIAGNOSIS — I129 Hypertensive chronic kidney disease with stage 1 through stage 4 chronic kidney disease, or unspecified chronic kidney disease: Secondary | ICD-10-CM | POA: Diagnosis not present

## 2019-03-23 DIAGNOSIS — M317 Microscopic polyangiitis: Secondary | ICD-10-CM | POA: Diagnosis not present

## 2019-03-24 ENCOUNTER — Ambulatory Visit: Payer: Medicare Other | Admitting: Internal Medicine

## 2019-03-24 ENCOUNTER — Other Ambulatory Visit: Payer: Self-pay | Admitting: Family Medicine

## 2019-03-25 ENCOUNTER — Encounter: Payer: Self-pay | Admitting: Internal Medicine

## 2019-03-25 ENCOUNTER — Other Ambulatory Visit: Payer: Self-pay | Admitting: Internal Medicine

## 2019-03-25 ENCOUNTER — Other Ambulatory Visit: Payer: Self-pay

## 2019-03-25 ENCOUNTER — Ambulatory Visit: Payer: Medicare Other | Admitting: Internal Medicine

## 2019-03-25 ENCOUNTER — Encounter (HOSPITAL_COMMUNITY): Payer: Medicare Other

## 2019-03-25 ENCOUNTER — Ambulatory Visit (HOSPITAL_COMMUNITY)
Admission: RE | Admit: 2019-03-25 | Discharge: 2019-03-25 | Disposition: A | Discharge status: Home / Self Care | Payer: Medicare Other | Source: Ambulatory Visit | Attending: Nephrology | Admitting: Nephrology

## 2019-03-25 VITALS — BP 161/65 | HR 62 | Temp 97.8°F

## 2019-03-25 DIAGNOSIS — B259 Cytomegaloviral disease, unspecified: Secondary | ICD-10-CM | POA: Diagnosis not present

## 2019-03-25 DIAGNOSIS — N184 Chronic kidney disease, stage 4 (severe): Secondary | ICD-10-CM

## 2019-03-25 DIAGNOSIS — A084 Viral intestinal infection, unspecified: Secondary | ICD-10-CM | POA: Diagnosis not present

## 2019-03-25 DIAGNOSIS — D649 Anemia, unspecified: Secondary | ICD-10-CM | POA: Diagnosis not present

## 2019-03-25 DIAGNOSIS — M317 Microscopic polyangiitis: Secondary | ICD-10-CM

## 2019-03-25 DIAGNOSIS — J849 Interstitial pulmonary disease, unspecified: Secondary | ICD-10-CM

## 2019-03-25 LAB — POCT HEMOGLOBIN-HEMACUE: Hemoglobin: 12.6 g/dL (ref 12.0–15.0)

## 2019-03-25 LAB — FERRITIN: Ferritin: 1325 ng/mL — ABNORMAL HIGH (ref 11–307)

## 2019-03-25 LAB — IRON AND TIBC
Iron: 65 ug/dL (ref 28–170)
Saturation Ratios: 21 % (ref 10.4–31.8)
TIBC: 302 ug/dL (ref 250–450)
UIBC: 237 ug/dL

## 2019-03-25 MED ORDER — EPOETIN ALFA-EPBX 40000 UNIT/ML IJ SOLN
30000.0000 [IU] | INTRAMUSCULAR | Status: DC
  Filled 2019-03-25: qty 1

## 2019-03-25 MED ORDER — VALGANCICLOVIR HCL 450 MG PO TABS: 450.0000 mg | ORAL_TABLET | ORAL | 1 refills | Status: DC

## 2019-03-25 NOTE — Progress Notes (Signed)
Patient ID: Emily Cannon, female   DOB: 26-Nov-1932, 85 y.o.   MRN: [id number]  HPI Kashish is a 85yo F with recent diagnosis of P-anca+ microscopic polyangiitis involving lung-kidneys now on pred 20 mg daily course complicated by UGIB thought to be related to CMV gastritis. She has been on valcyte TIW which she has tolerated less abdominal discomfort per her report. No blood in stool/nor tarry stool. Her previous shingles outbreak has healed Outpatient Encounter Medications as of 03/25/2019  Medication Sig  . amLODipine (NORVASC) 10 MG tablet TAKE ONE TABLET BY MOUTH DAILY  . atorvastatin (LIPITOR) 20 MG tablet TAKE ONE TABLET BY MOUTH DAILY  . hydrALAZINE (APRESOLINE) 50 MG tablet Take 1 tablet (50 mg total) by mouth 3 (three) times daily.  . Hydrocortisone (GERHARDT'S BUTT CREAM) CREA Apply 1 application topically 3 (three) times daily. (Patient taking differently: Apply 1 application topically 3 (three) times daily. TO AFFECTED SITES)  . levothyroxine (SYNTHROID) 50 MCG tablet TAKE ONE TABLET BY MOUTH EVERY MORNING BEFORE BREAKFAST  . magnesium oxide (MAG-OX) 400 MG tablet Take 400 mg by mouth daily with breakfast.  . metoprolol succinate (TOPROL-XL) 50 MG 24 hr tablet TAKE ONE TABLET BY MOUTH TWICE A DAY  . Multiple Vitamins-Minerals (CENTRUM SILVER 50+WOMEN) TABS Take 1 tablet by mouth daily with breakfast.  . predniSONE (DELTASONE) 20 MG tablet Take 3 tablets (60 mg total) by mouth daily before breakfast.  . valGANciclovir (VALCYTE) 450 MG tablet Take 1 tablet (450 mg total) by mouth every other day.  . calcitRIOL (ROCALTROL) 0.25 MCG capsule Take 0.25 mcg by mouth every Monday, Wednesday, and Friday.   . cefUROXime (CEFTIN) 250 MG tablet Take 1 tablet (250 mg total) by mouth 2 (two) times daily with a meal.  . Cholecalciferol (VITAMIN D-3 PO) Take 1 capsule by mouth every Monday, Wednesday, and Friday.  . dapsone 100 MG tablet Take 100 mg by mouth every morning.   . furosemide  (LASIX) 40 MG tablet Take 0.5 tablets (20 mg total) by mouth 3 (three) times a week. Monday, Wed, Friday (Patient taking differently: Take 40 mg by mouth every Monday, Wednesday, and Friday. )  . gemfibrozil (LOPID) 600 MG tablet TAKE ONE TABLET BY MOUTH DAILY  . hydrochlorothiazide (MICROZIDE) 12.5 MG capsule Take 12.5 mg by mouth daily.  . pantoprazole (PROTONIX) 40 MG tablet Take 1 tablet (40 mg total) by mouth 2 (two) times daily for 28 days.   No facility-administered encounter medications on file as of 03/25/2019.      Patient Active Problem List   Diagnosis Date Noted  . Heme positive stool   . Benign neoplasm of colon   . Acute blood loss anemia 12/30/2018  . Symptomatic anemia 12/15/2018  . AKI (acute kidney injury) ([facility]) 12/15/2018  . Hypomagnesemia 12/15/2018  . Steroid-induced hyperglycemia 12/15/2018  . Microscopic polyangiitis ([place]) 11/13/2018  . Positive P-ANCA titer 10/14/2018  . Viral URI 05/12/2018  . Syncope 06/05/2016  . Unspecified venous (peripheral) insufficiency 11/23/2012  . Dermatitis 11/10/2012  . Hypothyroidism 05/22/2011  . TYMPANIC MEMBRANE PERFORATION, LEFT EAR 08/08/2007  . Osteopenia 07/27/2007  . Hyperlipidemia 05/11/2007  . Anxiety state 05/11/2007  . Essential hypertension 05/11/2007  . ALLERGIC RHINITIS 05/11/2007  . Irritable bowel syndrome 05/11/2007  . LOW BACK PAIN 05/11/2007  . HYSTERECTOMY, HX OF 05/11/2007     Health Maintenance Due  Topic Date Due  . INFLUENZA VACCINE  02/27/2019     Review of Systems 12 point ros is  negative except bruising of skin, hearing impaired Physical Exam   BP (!) 161/65   Pulse 62   Temp 97.8 F (36.6 C) (Oral)   Physical Exam  Constitutional:  oriented to person, place, and time. appears well-developed and well-nourished. No distress.  HENT: [place]/AT, PERRLA, no scleral icterus Mouth/Throat: Oropharynx is clear and moist. No oropharyngeal exudate.  Cardiovascular: Normal rate, regular rhythm and  normal heart sounds. Exam reveals no gallop and no friction rub.  No murmur heard.  Pulmonary/Chest: Effort normal and breath sounds normal. No respiratory distress.  has no wheezes.  Abdominal: Soft. Bowel sounds are normal.  exhibits no distension. There is no tenderness.   Skin: Skin is warm and dryNo erythema. Numerous echymosis to arms   CBC Lab Results  Component Value Date   WBC 13.2 (H) 03/18/2019   RBC 3.74 (L) 03/18/2019   HGB 11.5 (L) 03/18/2019   HCT 35.7 03/18/2019   PLT 242 03/18/2019   MCV 95.5 03/18/2019   MCH 30.7 03/18/2019   MCHC 32.2 03/18/2019   RDW 16.4 (H) 03/18/2019   LYMPHSABS 1,808 03/18/2019   MONOABS 0.3 12/19/2018   EOSABS 53 03/18/2019    BMET Lab Results  Component Value Date   NA 139 03/18/2019   K 4.3 03/18/2019   CL 102 03/18/2019   CO2 26 03/18/2019   GLUCOSE 120 (H) 03/18/2019   BUN 27 (H) 03/18/2019   CREATININE 2.06 (H) 03/18/2019   CALCIUM 9.2 03/18/2019   GFRNONAA 20 (L) 01/03/2019   GFRAA 23 (L) 01/03/2019      Assessment and Plan CMV gastritis = will plan to continue with valcyte, renally dosed. Anticipate to stop once < 20mg  pred. Will check to see if she has any viremia as another source of management. Will discuss with dr Havery Moros if any utility to repeat egd  Therapeutic drug monitoring = will check cbc to ensure not having bone marrow suppression  ckd 5= has leveled off. Not requiring ESRD

## 2019-03-29 LAB — CBC WITH DIFFERENTIAL/PLATELET
Absolute Monocytes: 1026 cells/uL — ABNORMAL HIGH (ref 200–950)
Basophils Absolute: 54 cells/uL (ref 0–200)
Basophils Relative: 0.3 %
Eosinophils Absolute: 18 cells/uL (ref 15–500)
Eosinophils Relative: 0.1 %
HCT: 37.1 % (ref 35.0–45.0)
Hemoglobin: 11.9 g/dL (ref 11.7–15.5)
Lymphs Abs: 1098 cells/uL (ref 850–3900)
MCH: 30.3 pg (ref 27.0–33.0)
MCHC: 32.1 g/dL (ref 32.0–36.0)
MCV: 94.4 fL (ref 80.0–100.0)
MPV: 9.8 fL (ref 7.5–12.5)
Monocytes Relative: 5.7 %
Neutro Abs: 15804 cells/uL — ABNORMAL HIGH (ref 1500–7800)
Neutrophils Relative %: 87.8 %
Platelets: 300 10*3/uL (ref 140–400)
RBC: 3.93 10*6/uL (ref 3.80–5.10)
RDW: 16.3 % — ABNORMAL HIGH (ref 11.0–15.0)
Total Lymphocyte: 6.1 %
WBC: 18 10*3/uL — ABNORMAL HIGH (ref 3.8–10.8)

## 2019-03-29 LAB — CMV (CYTOMEGALOVIRUS) DNA ULTRAQUANT, PCR
CMV DNA, QN PCR: <2.3 log IU/mL (ref <2.30)
CMV DNA, QN Real Time PCR: <200 IU/mL (ref <200)

## 2019-03-29 LAB — BASIC METABOLIC PANEL
BUN/Creatinine Ratio: 18 (calc) (ref 6–22)
BUN: 35 mg/dL — ABNORMAL HIGH (ref 7–25)
CO2: 25 mmol/L (ref 20–32)
Calcium: 9.1 mg/dL (ref 8.6–10.4)
Chloride: 101 mmol/L (ref 98–110)
Creat: 1.98 mg/dL — ABNORMAL HIGH (ref 0.60–0.88)
Glucose, Bld: 118 mg/dL — ABNORMAL HIGH (ref 65–99)
Potassium: 4.1 mmol/L (ref 3.5–5.3)
Sodium: 138 mmol/L (ref 135–146)

## 2019-03-29 LAB — CMV ABS, IGG+IGM (CYTOMEGALOVIRUS)
CMV IgM: <30 AU/mL
Cytomegalovirus Ab-IgG: >10 U/mL — ABNORMAL HIGH

## 2019-03-30 DIAGNOSIS — E0922 Drug or chemical induced diabetes mellitus with diabetic chronic kidney disease: Secondary | ICD-10-CM | POA: Diagnosis not present

## 2019-03-30 DIAGNOSIS — H7290 Unspecified perforation of tympanic membrane, unspecified ear: Secondary | ICD-10-CM | POA: Diagnosis not present

## 2019-03-30 DIAGNOSIS — E7800 Pure hypercholesterolemia, unspecified: Secondary | ICD-10-CM | POA: Diagnosis not present

## 2019-03-30 DIAGNOSIS — T380X5D Adverse effect of glucocorticoids and synthetic analogues, subsequent encounter: Secondary | ICD-10-CM | POA: Diagnosis not present

## 2019-03-30 DIAGNOSIS — Z9181 History of falling: Secondary | ICD-10-CM | POA: Diagnosis not present

## 2019-03-30 DIAGNOSIS — N184 Chronic kidney disease, stage 4 (severe): Secondary | ICD-10-CM | POA: Diagnosis not present

## 2019-03-30 DIAGNOSIS — K589 Irritable bowel syndrome without diarrhea: Secondary | ICD-10-CM | POA: Diagnosis not present

## 2019-03-30 DIAGNOSIS — E039 Hypothyroidism, unspecified: Secondary | ICD-10-CM | POA: Diagnosis not present

## 2019-03-30 DIAGNOSIS — I872 Venous insufficiency (chronic) (peripheral): Secondary | ICD-10-CM | POA: Diagnosis not present

## 2019-03-30 DIAGNOSIS — I129 Hypertensive chronic kidney disease with stage 1 through stage 4 chronic kidney disease, or unspecified chronic kidney disease: Secondary | ICD-10-CM | POA: Diagnosis not present

## 2019-03-30 DIAGNOSIS — D631 Anemia in chronic kidney disease: Secondary | ICD-10-CM | POA: Diagnosis not present

## 2019-03-30 DIAGNOSIS — M317 Microscopic polyangiitis: Secondary | ICD-10-CM | POA: Diagnosis not present

## 2019-03-30 DIAGNOSIS — Z7984 Long term (current) use of oral hypoglycemic drugs: Secondary | ICD-10-CM | POA: Diagnosis not present

## 2019-03-30 DIAGNOSIS — M2351 Chronic instability of knee, right knee: Secondary | ICD-10-CM | POA: Diagnosis not present

## 2019-03-30 DIAGNOSIS — M8580 Other specified disorders of bone density and structure, unspecified site: Secondary | ICD-10-CM | POA: Diagnosis not present

## 2019-03-30 DIAGNOSIS — Z7952 Long term (current) use of systemic steroids: Secondary | ICD-10-CM | POA: Diagnosis not present

## 2019-03-31 ENCOUNTER — Other Ambulatory Visit: Payer: Self-pay

## 2019-03-31 ENCOUNTER — Other Ambulatory Visit: Payer: Medicare Other

## 2019-03-31 DIAGNOSIS — A084 Viral intestinal infection, unspecified: Secondary | ICD-10-CM

## 2019-03-31 DIAGNOSIS — B259 Cytomegaloviral disease, unspecified: Secondary | ICD-10-CM | POA: Diagnosis not present

## 2019-03-31 LAB — CBC WITH DIFFERENTIAL/PLATELET
Absolute Monocytes: 928 cells/uL (ref 200–950)
Basophils Absolute: 58 cells/uL (ref 0–200)
Basophils Relative: 0.5 %
Eosinophils Absolute: 46 cells/uL (ref 15–500)
Eosinophils Relative: 0.4 %
HCT: 35.9 % (ref 35.0–45.0)
Hemoglobin: 11.8 g/dL (ref 11.7–15.5)
Lymphs Abs: 1218 cells/uL (ref 850–3900)
MCH: 30.5 pg (ref 27.0–33.0)
MCHC: 32.9 g/dL (ref 32.0–36.0)
MCV: 92.8 fL (ref 80.0–100.0)
MPV: 9.5 fL (ref 7.5–12.5)
Monocytes Relative: 8 %
Neutro Abs: 9350 cells/uL — ABNORMAL HIGH (ref 1500–7800)
Neutrophils Relative %: 80.6 %
Platelets: 322 10*3/uL (ref 140–400)
RBC: 3.87 10*6/uL (ref 3.80–5.10)
RDW: 15.9 % — ABNORMAL HIGH (ref 11.0–15.0)
Total Lymphocyte: 10.5 %
WBC: 11.6 10*3/uL — ABNORMAL HIGH (ref 3.8–10.8)

## 2019-04-01 ENCOUNTER — Other Ambulatory Visit: Payer: Self-pay

## 2019-04-01 NOTE — Patient Outreach (Signed)
[facility]) Care Management  04/01/2019  ARIAUNA FARABEE 01-13-1933 [id number]   Medication Adherence call to Mrs. Lynnae Ludemann HIPPA Compliant Voice message left with a call back number. Spoke with patient last month patient explain she is no longer taking Glipizide 5 mg. Mrs. Frank is showing past due under [place].   [place] Management Direct Dial [phone]  Fax [phone] [email or web address]

## 2019-04-02 ENCOUNTER — Other Ambulatory Visit: Payer: Self-pay | Admitting: *Deleted

## 2019-04-02 DIAGNOSIS — A084 Viral intestinal infection, unspecified: Secondary | ICD-10-CM

## 2019-04-06 DIAGNOSIS — I129 Hypertensive chronic kidney disease with stage 1 through stage 4 chronic kidney disease, or unspecified chronic kidney disease: Secondary | ICD-10-CM | POA: Diagnosis not present

## 2019-04-06 DIAGNOSIS — E0922 Drug or chemical induced diabetes mellitus with diabetic chronic kidney disease: Secondary | ICD-10-CM | POA: Diagnosis not present

## 2019-04-06 DIAGNOSIS — E7800 Pure hypercholesterolemia, unspecified: Secondary | ICD-10-CM | POA: Diagnosis not present

## 2019-04-06 DIAGNOSIS — Z9181 History of falling: Secondary | ICD-10-CM | POA: Diagnosis not present

## 2019-04-06 DIAGNOSIS — N184 Chronic kidney disease, stage 4 (severe): Secondary | ICD-10-CM | POA: Diagnosis not present

## 2019-04-06 DIAGNOSIS — D631 Anemia in chronic kidney disease: Secondary | ICD-10-CM | POA: Diagnosis not present

## 2019-04-06 DIAGNOSIS — M317 Microscopic polyangiitis: Secondary | ICD-10-CM | POA: Diagnosis not present

## 2019-04-06 DIAGNOSIS — M8580 Other specified disorders of bone density and structure, unspecified site: Secondary | ICD-10-CM | POA: Diagnosis not present

## 2019-04-06 DIAGNOSIS — H7290 Unspecified perforation of tympanic membrane, unspecified ear: Secondary | ICD-10-CM | POA: Diagnosis not present

## 2019-04-06 DIAGNOSIS — K589 Irritable bowel syndrome without diarrhea: Secondary | ICD-10-CM | POA: Diagnosis not present

## 2019-04-06 DIAGNOSIS — I872 Venous insufficiency (chronic) (peripheral): Secondary | ICD-10-CM | POA: Diagnosis not present

## 2019-04-06 DIAGNOSIS — E039 Hypothyroidism, unspecified: Secondary | ICD-10-CM | POA: Diagnosis not present

## 2019-04-06 DIAGNOSIS — T380X5D Adverse effect of glucocorticoids and synthetic analogues, subsequent encounter: Secondary | ICD-10-CM | POA: Diagnosis not present

## 2019-04-06 DIAGNOSIS — Z7952 Long term (current) use of systemic steroids: Secondary | ICD-10-CM | POA: Diagnosis not present

## 2019-04-06 DIAGNOSIS — M2351 Chronic instability of knee, right knee: Secondary | ICD-10-CM | POA: Diagnosis not present

## 2019-04-06 DIAGNOSIS — Z7984 Long term (current) use of oral hypoglycemic drugs: Secondary | ICD-10-CM | POA: Diagnosis not present

## 2019-04-07 ENCOUNTER — Other Ambulatory Visit: Payer: Self-pay

## 2019-04-07 ENCOUNTER — Other Ambulatory Visit: Payer: Medicare Other

## 2019-04-07 DIAGNOSIS — A084 Viral intestinal infection, unspecified: Secondary | ICD-10-CM | POA: Diagnosis not present

## 2019-04-07 DIAGNOSIS — B259 Cytomegaloviral disease, unspecified: Secondary | ICD-10-CM

## 2019-04-07 LAB — CBC WITH DIFFERENTIAL/PLATELET
Absolute Monocytes: 722 cells/uL (ref 200–950)
Basophils Absolute: 49 cells/uL (ref 0–200)
Basophils Relative: 0.6 %
Eosinophils Absolute: 57 cells/uL (ref 15–500)
Eosinophils Relative: 0.7 %
HCT: 35.4 % (ref 35.0–45.0)
Hemoglobin: 11.9 g/dL (ref 11.7–15.5)
Lymphs Abs: 1296 cells/uL (ref 850–3900)
MCH: 31.1 pg (ref 27.0–33.0)
MCHC: 33.6 g/dL (ref 32.0–36.0)
MCV: 92.4 fL (ref 80.0–100.0)
MPV: 9.7 fL (ref 7.5–12.5)
Monocytes Relative: 8.8 %
Neutro Abs: 6076 cells/uL (ref 1500–7800)
Neutrophils Relative %: 74.1 %
Platelets: 249 10*3/uL (ref 140–400)
RBC: 3.83 10*6/uL (ref 3.80–5.10)
RDW: 16.2 % — ABNORMAL HIGH (ref 11.0–15.0)
Total Lymphocyte: 15.8 %
WBC: 8.2 10*3/uL (ref 3.8–10.8)

## 2019-04-08 ENCOUNTER — Ambulatory Visit (HOSPITAL_COMMUNITY)
Admission: RE | Admit: 2019-04-08 | Discharge: 2019-04-08 | Disposition: A | Discharge status: Home / Self Care | Payer: Medicare Other | Source: Ambulatory Visit | Attending: Nephrology | Admitting: Nephrology

## 2019-04-08 VITALS — BP 142/54 | HR 57 | Temp 96.5°F | Resp 20

## 2019-04-08 DIAGNOSIS — D649 Anemia, unspecified: Secondary | ICD-10-CM

## 2019-04-08 MED ORDER — EPOETIN ALFA-EPBX 10000 UNIT/ML IJ SOLN
10000.0000 [IU] | INTRAMUSCULAR | Status: DC
  Filled 2019-04-08: qty 1

## 2019-04-09 ENCOUNTER — Ambulatory Visit
Admission: RE | Admit: 2019-04-09 | Discharge: 2019-04-09 | Disposition: A | Discharge status: Home / Self Care | Payer: Medicare Other | Source: Ambulatory Visit | Attending: Internal Medicine | Admitting: Internal Medicine

## 2019-04-09 ENCOUNTER — Other Ambulatory Visit: Payer: Self-pay

## 2019-04-09 DIAGNOSIS — M317 Microscopic polyangiitis: Secondary | ICD-10-CM

## 2019-04-09 DIAGNOSIS — R0600 Dyspnea, unspecified: Secondary | ICD-10-CM | POA: Diagnosis not present

## 2019-04-09 DIAGNOSIS — J849 Interstitial pulmonary disease, unspecified: Secondary | ICD-10-CM

## 2019-04-09 LAB — POCT HEMOGLOBIN-HEMACUE: Hemoglobin: 12.2 g/dL (ref 12.0–15.0)

## 2019-04-11 ENCOUNTER — Other Ambulatory Visit: Payer: Self-pay | Admitting: Family Medicine

## 2019-04-12 DIAGNOSIS — D631 Anemia in chronic kidney disease: Secondary | ICD-10-CM | POA: Diagnosis not present

## 2019-04-12 DIAGNOSIS — E0922 Drug or chemical induced diabetes mellitus with diabetic chronic kidney disease: Secondary | ICD-10-CM | POA: Diagnosis not present

## 2019-04-12 DIAGNOSIS — T380X5D Adverse effect of glucocorticoids and synthetic analogues, subsequent encounter: Secondary | ICD-10-CM | POA: Diagnosis not present

## 2019-04-12 DIAGNOSIS — H7290 Unspecified perforation of tympanic membrane, unspecified ear: Secondary | ICD-10-CM | POA: Diagnosis not present

## 2019-04-12 DIAGNOSIS — I129 Hypertensive chronic kidney disease with stage 1 through stage 4 chronic kidney disease, or unspecified chronic kidney disease: Secondary | ICD-10-CM | POA: Diagnosis not present

## 2019-04-12 DIAGNOSIS — N184 Chronic kidney disease, stage 4 (severe): Secondary | ICD-10-CM | POA: Diagnosis not present

## 2019-04-12 DIAGNOSIS — K589 Irritable bowel syndrome without diarrhea: Secondary | ICD-10-CM | POA: Diagnosis not present

## 2019-04-12 DIAGNOSIS — Z7984 Long term (current) use of oral hypoglycemic drugs: Secondary | ICD-10-CM | POA: Diagnosis not present

## 2019-04-12 DIAGNOSIS — E039 Hypothyroidism, unspecified: Secondary | ICD-10-CM | POA: Diagnosis not present

## 2019-04-12 DIAGNOSIS — I872 Venous insufficiency (chronic) (peripheral): Secondary | ICD-10-CM | POA: Diagnosis not present

## 2019-04-12 DIAGNOSIS — Z7952 Long term (current) use of systemic steroids: Secondary | ICD-10-CM | POA: Diagnosis not present

## 2019-04-12 DIAGNOSIS — Z9181 History of falling: Secondary | ICD-10-CM | POA: Diagnosis not present

## 2019-04-12 DIAGNOSIS — E7800 Pure hypercholesterolemia, unspecified: Secondary | ICD-10-CM | POA: Diagnosis not present

## 2019-04-12 DIAGNOSIS — M8580 Other specified disorders of bone density and structure, unspecified site: Secondary | ICD-10-CM | POA: Diagnosis not present

## 2019-04-12 DIAGNOSIS — M2351 Chronic instability of knee, right knee: Secondary | ICD-10-CM | POA: Diagnosis not present

## 2019-04-12 DIAGNOSIS — M317 Microscopic polyangiitis: Secondary | ICD-10-CM | POA: Diagnosis not present

## 2019-04-13 ENCOUNTER — Other Ambulatory Visit: Payer: Self-pay

## 2019-04-13 DIAGNOSIS — N184 Chronic kidney disease, stage 4 (severe): Secondary | ICD-10-CM

## 2019-04-14 ENCOUNTER — Ambulatory Visit (INDEPENDENT_AMBULATORY_CARE_PROVIDER_SITE_OTHER)
Admission: RE | Admit: 2019-04-14 | Discharge: 2019-04-14 | Disposition: A | Discharge status: Home / Self Care | Payer: Medicare Other | Source: Ambulatory Visit | Attending: Vascular Surgery | Admitting: Vascular Surgery

## 2019-04-14 ENCOUNTER — Other Ambulatory Visit: Payer: Self-pay

## 2019-04-14 ENCOUNTER — Ambulatory Visit (INDEPENDENT_AMBULATORY_CARE_PROVIDER_SITE_OTHER): Payer: Medicare Other | Admitting: Internal Medicine

## 2019-04-14 ENCOUNTER — Encounter: Payer: Self-pay | Admitting: Vascular Surgery

## 2019-04-14 ENCOUNTER — Ambulatory Visit (HOSPITAL_COMMUNITY)
Admission: RE | Admit: 2019-04-14 | Discharge: 2019-04-14 | Disposition: A | Discharge status: Home / Self Care | Payer: Medicare Other | Source: Ambulatory Visit | Attending: Vascular Surgery | Admitting: Vascular Surgery

## 2019-04-14 ENCOUNTER — Encounter: Payer: Self-pay | Admitting: Internal Medicine

## 2019-04-14 ENCOUNTER — Ambulatory Visit (INDEPENDENT_AMBULATORY_CARE_PROVIDER_SITE_OTHER): Payer: Medicare Other | Admitting: Vascular Surgery

## 2019-04-14 VITALS — BP 137/61 | HR 59 | Temp 97.6°F | Resp 18 | Ht 62.0 in | Wt 125.2 lb

## 2019-04-14 VITALS — BP 155/66 | HR 51 | Temp 97.9°F

## 2019-04-14 DIAGNOSIS — Z23 Encounter for immunization: Secondary | ICD-10-CM

## 2019-04-14 DIAGNOSIS — N184 Chronic kidney disease, stage 4 (severe): Secondary | ICD-10-CM

## 2019-04-14 DIAGNOSIS — B259 Cytomegaloviral disease, unspecified: Secondary | ICD-10-CM | POA: Diagnosis not present

## 2019-04-14 DIAGNOSIS — A084 Viral intestinal infection, unspecified: Secondary | ICD-10-CM | POA: Diagnosis not present

## 2019-04-14 LAB — CBC WITH DIFFERENTIAL/PLATELET
Absolute Monocytes: 657 cells/uL (ref 200–950)
Basophils Absolute: 50 cells/uL (ref 0–200)
Basophils Relative: 0.4 %
Eosinophils Absolute: 87 cells/uL (ref 15–500)
Eosinophils Relative: 0.7 %
HCT: 35.9 % (ref 35.0–45.0)
Hemoglobin: 11.9 g/dL (ref 11.7–15.5)
Lymphs Abs: 1637 cells/uL (ref 850–3900)
MCH: 30.4 pg (ref 27.0–33.0)
MCHC: 33.1 g/dL (ref 32.0–36.0)
MCV: 91.6 fL (ref 80.0–100.0)
MPV: 9.7 fL (ref 7.5–12.5)
Monocytes Relative: 5.3 %
Neutro Abs: 9970 cells/uL — ABNORMAL HIGH (ref 1500–7800)
Neutrophils Relative %: 80.4 %
Platelets: 195 10*3/uL (ref 140–400)
RBC: 3.92 10*6/uL (ref 3.80–5.10)
RDW: 15.9 % — ABNORMAL HIGH (ref 11.0–15.0)
Total Lymphocyte: 13.2 %
WBC: 12.4 10*3/uL — ABNORMAL HIGH (ref 3.8–10.8)

## 2019-04-14 NOTE — Progress Notes (Signed)
REASON FOR CONSULT:    Stage IV chronic kidney disease.  The consult is requested by Dr. Harrie Jeans.  ASSESSMENT & PLAN:   STAGE IV CHRONIC KIDNEY DISEASE: Based on her vein map and physical exam I do not think the patient is a candidate for an AV fistula.  This reason we will hold off before placing an AV graft.  I have had a long discussion with the patient and the son concerning the reasoning for holding off on an AV graft at this time.  I have discussed the indications for the procedure and the potential complications.  Certainly if her renal function worsens I would recommend proceeding with placement of a left AV graft.  She does have significant bilateral lower extremity swelling we have discussed the proper position for leg elevation.  I will see her back as needed.  Deitra Mayo, MD, FACS Beeper [phone] Office: [phone]   HPI:   HALO SHEVLIN is a pleasant 85 y.o. female, who was referred for evaluation for hemodialysis access.  She has stage IV chronic kidney disease.  We are asked to place an AV fistula if at all possible.  If this were not possible we were asked to wait before placing an AV graft.  I have reviewed the records from the referring office.  The patient was seen on 01/06/2019.  GFR at that time was 18.  According to her son her GFR is improved into the low 20s and has been stable there.  She has had some bilateral lower extremity swelling.  Otherwise she denies any recent uremic symptoms.  Specifically, she denies nausea, vomiting, fatigue, or anorexia.  She is right-handed.  She does not have a pacemaker.  She is not on anticoagulants.  Past Medical History:  Diagnosis Date  . Allergic rhinitis, cause unspecified   . Anxiety state, unspecified   . Disorder of bone and cartilage, unspecified   . Hypothyroid   . Irritable bowel syndrome   . Lumbago   . Other and unspecified hyperlipidemia   . Perforation of tympanic membrane, unspecified   .  Unspecified essential hypertension     Family History  Problem Relation Age of Onset  . Lung cancer Mother 13  . Lung disease Father 54  . COPD Other        sibling  . Colon cancer Neg Hx     SOCIAL HISTORY: Social History   Socioeconomic History  . Marital status: Married    Spouse name: Jeneen Rinks  . Number of children: 2  . Years of education: Not on file  . Highest education level: Not on file  Occupational History  . Occupation: retired Health visitor  . Financial resource strain: Not on file  . Food insecurity    Worry: Not on file    Inability: Not on file  . Transportation needs    Medical: Not on file    Non-medical: Not on file  Tobacco Use  . Smoking status: Never Smoker  . Smokeless tobacco: Never Used  Substance and Sexual Activity  . Alcohol use: No  . Drug use: No  . Sexual activity: Not on file  Lifestyle  . Physical activity    Days per week: Not on file    Minutes per session: Not on file  . Stress: Not on file  Relationships  . Social Herbalist on phone: Not on file    Gets together: Not on file  Attends religious service: Not on file    Active member of club or organization: Not on file    Attends meetings of clubs or organizations: Not on file    Relationship status: Not on file  . Intimate partner violence    Fear of current or ex partner: Not on file    Emotionally abused: Not on file    Physically abused: Not on file    Forced sexual activity: Not on file  Other Topics Concern  . Not on file  Social History Narrative   Son is Doug Coble--pt here     Allergies  Allergen Reactions  . Alendronate Sodium Shortness Of Breath  . Amoxicillin Diarrhea    Severe diarrhea  . Codeine Nausea Only  . Valsartan Swelling    Gums/throat swell    Current Outpatient Medications  Medication Sig Dispense Refill  . amLODipine (NORVASC) 10 MG tablet TAKE ONE TABLET BY MOUTH DAILY 90 tablet 0  . atorvastatin (LIPITOR) 20  MG tablet TAKE ONE TABLET BY MOUTH DAILY 90 tablet 0  . calcitRIOL (ROCALTROL) 0.25 MCG capsule Take 0.25 mcg by mouth every Monday, Wednesday, and Friday.     . cefUROXime (CEFTIN) 250 MG tablet Take 1 tablet (250 mg total) by mouth 2 (two) times daily with a meal. 14 tablet 0  . Cholecalciferol (VITAMIN D-3 PO) Take 1 capsule by mouth every Monday, Wednesday, and Friday.    . dapsone 100 MG tablet Take 100 mg by mouth every morning.     . furosemide (LASIX) 40 MG tablet Take 0.5 tablets (20 mg total) by mouth 3 (three) times a week. Monday, Wed, Friday (Patient taking differently: Take 40 mg by mouth every Monday, Wednesday, and Friday. ) 30 tablet   . gemfibrozil (LOPID) 600 MG tablet TAKE ONE TABLET BY MOUTH DAILY 90 tablet 0  . hydrALAZINE (APRESOLINE) 50 MG tablet Take 1 tablet (50 mg total) by mouth 3 (three) times daily. 270 tablet 0  . hydrochlorothiazide (MICROZIDE) 12.5 MG capsule Take 12.5 mg by mouth daily.    . Hydrocortisone (GERHARDT'S BUTT CREAM) CREA Apply 1 application topically 3 (three) times daily. (Patient taking differently: Apply 1 application topically 3 (three) times daily. TO AFFECTED SITES) 1 each 2  . levothyroxine (SYNTHROID) 50 MCG tablet TAKE ONE TABLET BY MOUTH EVERY MORNING BEFORE BREAKFAST 90 tablet 0  . magnesium oxide (MAG-OX) 400 MG tablet Take 400 mg by mouth daily with breakfast.    . metoprolol succinate (TOPROL-XL) 50 MG 24 hr tablet TAKE ONE TABLET BY MOUTH TWICE A DAY 180 tablet 0  . Multiple Vitamins-Minerals (CENTRUM SILVER 50+WOMEN) TABS Take 1 tablet by mouth daily with breakfast.    . predniSONE (DELTASONE) 10 MG tablet     . valGANciclovir (VALCYTE) 450 MG tablet Take 1 tablet (450 mg total) by mouth every other day. 14 tablet 1  . pantoprazole (PROTONIX) 40 MG tablet Take 1 tablet (40 mg total) by mouth 2 (two) times daily for 28 days. 56 tablet 0   No current facility-administered medications for this visit.     REVIEW OF SYSTEMS:  [X]   denotes positive finding, [ ]  denotes negative finding Cardiac  Comments:  Chest pain or chest pressure: x   Shortness of breath upon exertion: x   Short of breath when lying flat: x   Irregular heart rhythm:        Vascular    Pain in calf, thigh, or hip brought on by ambulation:  Pain in feet at night that wakes you up from your sleep:     Blood clot in your veins:    Leg swelling:  x       Pulmonary    Oxygen at home:    Productive cough:  x   Wheezing:  x       Neurologic    Sudden weakness in arms or legs:     Sudden numbness in arms or legs:     Sudden onset of difficulty speaking or slurred speech:    Temporary loss of vision in one eye:     Problems with dizziness:         Gastrointestinal    Blood in stool:     Vomited blood:         Genitourinary    Burning when urinating:     Blood in urine: x       Psychiatric    Major depression:         Hematologic    Bleeding problems:    Problems with blood clotting too easily:        Skin    Rashes or ulcers: x       Constitutional    Fever or chills:     PHYSICAL EXAM:   Vitals:   04/14/19 1355  BP: 137/61  Pulse: (!) 59  Resp: 18  Temp: 97.6 F (36.4 C)  SpO2: 96%  Weight: 125 lb 3.2 oz (56.8 kg)  Height: 5\' 2"  (1.575 m)    GENERAL: The patient is a well-nourished female, in no acute distress. The vital signs are documented above. CARDIAC: There is a regular rate and rhythm.  VASCULAR: I do not detect carotid bruits. She has palpable radial pulses bilaterally. I do not see any usable upper extremity veins for a fistula. PULMONARY: There is good air exchange bilaterally without wheezing or rales. ABDOMEN: Soft and non-tender with normal pitched bowel sounds.  MUSCULOSKELETAL: There are no major deformities or cyanosis. NEUROLOGIC: No focal weakness or paresthesias are detected. SKIN: There are no ulcers or rashes noted. PSYCHIATRIC: The patient has a normal affect.  DATA:    VEIN MAP: I  have independently interpreted her vein map.  On the right side the forearm and upper arm cephalic vein do not appear to be usable.  The basilic vein is not usable.  On the left side the forearm and upper arm cephalic vein do not appear to be usable.  The basilic vein on the left is also very small.  ARTERIAL DUPLEX: I have independently interpreted the patient's arterial duplex scan today.  On the right side there is a triphasic radial and ulnar waveform.  The brachial artery measures 0.44 cm in diameter.  On the left side there is a biphasic radial and ulnar waveform.  The brachial artery measures 0.43 cm in diameter.  LABS: Kim profile on 03/25/2019 shows a creatinine of 1.98.

## 2019-04-14 NOTE — Progress Notes (Signed)
Patient ID: Emily Cannon, female   DOB: 05-02-1933, 85 y.o.   MRN: [id number]  HPI 85yo F with history of polyangitis vasculitis diagnosed early 2020 with CKD-5, slow prednidsone taper, now on prednisone 10mg  daily. Also diagnosed with cvm gastritis in the setting of GIB. Has been on valganciclovir x>4 wk -roughly at 6 wk mark. Patient reporting feeling better. Still easy bruising. Overall doing well.  Outpatient Encounter Medications as of 04/14/2019  Medication Sig  . amLODipine (NORVASC) 10 MG tablet TAKE ONE TABLET BY MOUTH DAILY  . atorvastatin (LIPITOR) 20 MG tablet TAKE ONE TABLET BY MOUTH DAILY  . calcitRIOL (ROCALTROL) 0.25 MCG capsule Take 0.25 mcg by mouth every Monday, Wednesday, and Friday.   . cefUROXime (CEFTIN) 250 MG tablet Take 1 tablet (250 mg total) by mouth 2 (two) times daily with a meal.  . Cholecalciferol (VITAMIN D-3 PO) Take 1 capsule by mouth every Monday, Wednesday, and Friday.  . dapsone 100 MG tablet Take 100 mg by mouth every morning.   . furosemide (LASIX) 40 MG tablet Take 0.5 tablets (20 mg total) by mouth 3 (three) times a week. Monday, Wed, Friday (Patient taking differently: Take 40 mg by mouth every Monday, Wednesday, and Friday. )  . gemfibrozil (LOPID) 600 MG tablet TAKE ONE TABLET BY MOUTH DAILY  . hydrALAZINE (APRESOLINE) 50 MG tablet Take 1 tablet (50 mg total) by mouth 3 (three) times daily.  . hydrochlorothiazide (MICROZIDE) 12.5 MG capsule Take 12.5 mg by mouth daily.  . Hydrocortisone (Emily Cannon'S BUTT CREAM) CREA Apply 1 application topically 3 (three) times daily. (Patient taking differently: Apply 1 application topically 3 (three) times daily. TO AFFECTED SITES)  . levothyroxine (SYNTHROID) 50 MCG tablet TAKE ONE TABLET BY MOUTH EVERY MORNING BEFORE BREAKFAST  . magnesium oxide (MAG-OX) 400 MG tablet Take 400 mg by mouth daily with breakfast.  . metoprolol succinate (TOPROL-XL) 50 MG 24 hr tablet TAKE ONE TABLET BY MOUTH TWICE A DAY  .  Multiple Vitamins-Minerals (CENTRUM SILVER 50+WOMEN) TABS Take 1 tablet by mouth daily with breakfast.  . pantoprazole (PROTONIX) 40 MG tablet Take 1 tablet (40 mg total) by mouth 2 (two) times daily for 28 days.  . predniSONE (DELTASONE) 20 MG tablet Take 3 tablets (60 mg total) by mouth daily before breakfast.  . valGANciclovir (VALCYTE) 450 MG tablet Take 1 tablet (450 mg total) by mouth every other day.   No facility-administered encounter medications on file as of 04/14/2019.      Patient Active Problem List   Diagnosis Date Noted  . Heme positive stool   . Benign neoplasm of colon   . Acute blood loss anemia 12/30/2018  . Symptomatic anemia 12/15/2018  . AKI (acute kidney injury) ([place]) 12/15/2018  . Hypomagnesemia 12/15/2018  . Steroid-induced hyperglycemia 12/15/2018  . Microscopic polyangiitis ([place]) 11/13/2018  . Positive P-ANCA titer 10/14/2018  . Viral URI 05/12/2018  . Syncope 06/05/2016  . Unspecified venous (peripheral) insufficiency 11/23/2012  . Dermatitis 11/10/2012  . Hypothyroidism 05/22/2011  . TYMPANIC MEMBRANE PERFORATION, LEFT EAR 08/08/2007  . Osteopenia 07/27/2007  . Hyperlipidemia 05/11/2007  . Anxiety state 05/11/2007  . Essential hypertension 05/11/2007  . ALLERGIC RHINITIS 05/11/2007  . Irritable bowel syndrome 05/11/2007  . LOW BACK PAIN 05/11/2007  . HYSTERECTOMY, HX OF 05/11/2007     Health Maintenance Due  Topic Date Due  . INFLUENZA VACCINE  02/27/2019    Social History   Tobacco Use  . Smoking status: Never Smoker  . Smokeless  tobacco: Never Used  Substance Use Topics  . Alcohol use: No  . Drug use: No    Review of Systems 12 point ros is negative except what is mentioned above Physical Exam  BP (!) 155/66   Pulse (!) 51   Temp 97.9 F (36.6 C) (Oral)   SpO2 96%  Physical Exam  Constitutional:  oriented to person, place, and time. appears well-developed and well-nourished. No distress.  HENT: Emily Cannon/AT, PERRLA, no scleral  icterus Mouth/Throat: Oropharynx is clear and moist. No oropharyngeal exudate.  Cardiovascular: Normal rate, regular rhythm and normal heart sounds. Exam reveals no gallop and no friction rub.  No murmur heard.  Pulmonary/Chest: Effort normal and breath sounds normal. No respiratory distress.  has no wheezes.  Ext: trace edema Skin = scattered echymosis to forearms bilaterally Neurological: alert and oriented to person, place, and time. Hard of hearing  CBC Lab Results  Component Value Date   WBC 8.2 04/07/2019   RBC 3.83 04/07/2019   HGB 12.2 04/08/2019   HCT 35.4 04/07/2019   PLT 249 04/07/2019   MCV 92.4 04/07/2019   MCH 31.1 04/07/2019   MCHC 33.6 04/07/2019   RDW 16.2 (H) 04/07/2019   LYMPHSABS 1,296 04/07/2019   MONOABS 0.3 12/19/2018   EOSABS 57 04/07/2019    BMET Lab Results  Component Value Date   NA 138 03/25/2019   K 4.1 03/25/2019   CL 101 03/25/2019   CO2 25 03/25/2019   GLUCOSE 118 (H) 03/25/2019   BUN 35 (H) 03/25/2019   CREATININE 1.98 (H) 03/25/2019   CALCIUM 9.1 03/25/2019   GFRNONAA 20 (L) 01/03/2019   GFRAA 23 (L) 01/03/2019      Assessment and Plan  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 Will continue until she has repeat EGD. I have Reached out  to coordinate with  Dr Emily Cannon. Continue with weekly cbc  Therapeutic drug monitoring = will check cbc with diff to see if any BM suppression. Thus far has tolerated.  Health maintenance = will give high dose flu vaccine today

## 2019-04-16 ENCOUNTER — Telehealth: Payer: Self-pay

## 2019-04-16 NOTE — Telephone Encounter (Signed)
Left message for patient to call back  

## 2019-04-19 ENCOUNTER — Telehealth: Payer: Self-pay

## 2019-04-19 NOTE — Telephone Encounter (Signed)
-----   Message from Yetta Flock, MD sent at 04/14/2019 12:34 PM EDT ----- Regarding: EGD Emily Cannon can you help coordinate a direct EGD for this patient at the [facility], to follow up gastric ulcer? Thanks much. Can you place in first available opening if she is willing.  ----- Message ----- From: Carlyle Basques, MD Sent: 04/14/2019   9:39 AM EDT To: Yetta Flock, MD  Hi steve, I have been treating [name]'s cmv gastritis roughly 8wk. Any chance you can repeat EGD to see how it is doing? She is starting to have SE of valcyte with mild bone marrow suppression

## 2019-04-19 NOTE — Telephone Encounter (Signed)
-----   Message from Yetta Flock, MD sent at 04/15/2019 11:50 AM EDT ----- Regarding: RE: EGD Dr. Henrene Pastor saw her once in the hospital after I had seen her during her inpatient stay. Dr. Olevia Perches / Sharlett Iles have done her procedures in the past and who she was previously established with. I know her case and have been coordinating with Dr. Baxter Flattery of ID who contact me about her. In this light I'd like to do her exam. If she only wants Dr. Henrene Pastor will have to ask him. Thanks  ----- Message ----- From: Hughie Closs, RN Sent: 04/15/2019   9:56 AM EDT To: Yetta Flock, MD Subject: RE: EGD                                        When I called patient to schedule her EGD,  she and her husband stated that Dr. Henrene Pastor is her normal Dr. and that Dr. Havery Moros only did her procedure in the hospital.  Michela Pitcher she wanted Dr. Henrene Pastor to do her EGD. I don't see on her chart where she has seen Dr. Henrene Pastor. Please advise  Thanks Sherlynn Stalls ----- Message ----- From: Yetta Flock, MD Sent: 04/14/2019  12:34 PM EDT To: Yetta Flock, MD, Hughie Closs, RN Subject: EGD                                            Sherlynn Stalls can you help coordinate a direct EGD for this patient at the [facility], to follow up gastric ulcer? Thanks much. Can you place in first available opening if she is willing.  ----- Message ----- From: Carlyle Basques, MD Sent: 04/14/2019   9:39 AM EDT To: Yetta Flock, MD  Hi steve, I have been treating mrs Calico's cmv gastritis roughly 8wk. Any chance you can repeat EGD to see how it is doing? She is starting to have SE of valcyte with mild bone marrow suppression

## 2019-04-19 NOTE — Telephone Encounter (Signed)
Patient scheduled for EGD in [place] on 04/29/19 at 10:00am. Pre-visit scheduled 04/22/19 patient to arrive at 2:20pm.

## 2019-04-19 NOTE — Telephone Encounter (Signed)
See phone note

## 2019-04-20 DIAGNOSIS — M317 Microscopic polyangiitis: Secondary | ICD-10-CM | POA: Diagnosis not present

## 2019-04-20 DIAGNOSIS — I872 Venous insufficiency (chronic) (peripheral): Secondary | ICD-10-CM | POA: Diagnosis not present

## 2019-04-20 DIAGNOSIS — Z9181 History of falling: Secondary | ICD-10-CM | POA: Diagnosis not present

## 2019-04-20 DIAGNOSIS — E0922 Drug or chemical induced diabetes mellitus with diabetic chronic kidney disease: Secondary | ICD-10-CM | POA: Diagnosis not present

## 2019-04-20 DIAGNOSIS — D631 Anemia in chronic kidney disease: Secondary | ICD-10-CM | POA: Diagnosis not present

## 2019-04-20 DIAGNOSIS — M2351 Chronic instability of knee, right knee: Secondary | ICD-10-CM | POA: Diagnosis not present

## 2019-04-20 DIAGNOSIS — I129 Hypertensive chronic kidney disease with stage 1 through stage 4 chronic kidney disease, or unspecified chronic kidney disease: Secondary | ICD-10-CM | POA: Diagnosis not present

## 2019-04-20 DIAGNOSIS — Z7952 Long term (current) use of systemic steroids: Secondary | ICD-10-CM | POA: Diagnosis not present

## 2019-04-20 DIAGNOSIS — E7800 Pure hypercholesterolemia, unspecified: Secondary | ICD-10-CM | POA: Diagnosis not present

## 2019-04-20 DIAGNOSIS — T380X5D Adverse effect of glucocorticoids and synthetic analogues, subsequent encounter: Secondary | ICD-10-CM | POA: Diagnosis not present

## 2019-04-20 DIAGNOSIS — N184 Chronic kidney disease, stage 4 (severe): Secondary | ICD-10-CM | POA: Diagnosis not present

## 2019-04-20 DIAGNOSIS — K589 Irritable bowel syndrome without diarrhea: Secondary | ICD-10-CM | POA: Diagnosis not present

## 2019-04-20 DIAGNOSIS — Z7984 Long term (current) use of oral hypoglycemic drugs: Secondary | ICD-10-CM | POA: Diagnosis not present

## 2019-04-20 DIAGNOSIS — M8580 Other specified disorders of bone density and structure, unspecified site: Secondary | ICD-10-CM | POA: Diagnosis not present

## 2019-04-20 DIAGNOSIS — H7290 Unspecified perforation of tympanic membrane, unspecified ear: Secondary | ICD-10-CM | POA: Diagnosis not present

## 2019-04-20 DIAGNOSIS — E039 Hypothyroidism, unspecified: Secondary | ICD-10-CM | POA: Diagnosis not present

## 2019-04-21 DIAGNOSIS — N184 Chronic kidney disease, stage 4 (severe): Secondary | ICD-10-CM | POA: Diagnosis not present

## 2019-04-22 ENCOUNTER — Encounter: Payer: Self-pay | Admitting: Gastroenterology

## 2019-04-22 ENCOUNTER — Encounter (HOSPITAL_COMMUNITY): Payer: Medicare Other

## 2019-04-22 ENCOUNTER — Other Ambulatory Visit: Payer: Self-pay

## 2019-04-22 ENCOUNTER — Ambulatory Visit (AMBULATORY_SURGERY_CENTER): Payer: Self-pay | Admitting: *Deleted

## 2019-04-22 VITALS — Temp 96.8°F | Ht 62.0 in | Wt 125.0 lb

## 2019-04-22 DIAGNOSIS — A084 Viral intestinal infection, unspecified: Secondary | ICD-10-CM

## 2019-04-22 DIAGNOSIS — B259 Cytomegaloviral disease, unspecified: Secondary | ICD-10-CM

## 2019-04-22 NOTE — Progress Notes (Signed)
No egg or soy allergy known to patient  No issues with past sedation with any surgeries  or procedures, no intubation problems  No diet pills per patient No home 02 use per patient  No blood thinners per patient  Pt denies issues with constipation  No A fib or A flutter  EMMI video sent to pt's e mail  Husband in [place] today with pt due to [facility]- his temp 96-9  Due to the COVID-19 pandemic we are asking patients to follow these guidelines. Please only bring one care partner. Please be aware that your care partner may wait in the car in the parking lot or if they feel like they will be too hot to wait in the car, they may wait in the lobby on the 4th floor. All care partners are required to wear a mask the entire time (we do not have any that we can provide them), they need to practice social distancing, and we will do a Covid check for all patient's and care partners when you arrive. Also we will check their temperature and your temperature. If the care partner waits in their car they need to stay in the parking lot the entire time and we will call them on their cell phone when the patient is ready for discharge so they can bring the car to the front of the building. Also all patient's will need to wear a mask into building.

## 2019-04-28 ENCOUNTER — Telehealth: Payer: Self-pay | Admitting: Gastroenterology

## 2019-04-28 DIAGNOSIS — R801 Persistent proteinuria, unspecified: Secondary | ICD-10-CM | POA: Diagnosis not present

## 2019-04-28 DIAGNOSIS — D649 Anemia, unspecified: Secondary | ICD-10-CM | POA: Diagnosis not present

## 2019-04-28 DIAGNOSIS — N184 Chronic kidney disease, stage 4 (severe): Secondary | ICD-10-CM | POA: Diagnosis not present

## 2019-04-28 DIAGNOSIS — I129 Hypertensive chronic kidney disease with stage 1 through stage 4 chronic kidney disease, or unspecified chronic kidney disease: Secondary | ICD-10-CM | POA: Diagnosis not present

## 2019-04-28 DIAGNOSIS — N2581 Secondary hyperparathyroidism of renal origin: Secondary | ICD-10-CM | POA: Diagnosis not present

## 2019-04-28 NOTE — Telephone Encounter (Signed)

## 2019-04-29 ENCOUNTER — Other Ambulatory Visit: Payer: Self-pay | Admitting: Gastroenterology

## 2019-04-29 ENCOUNTER — Other Ambulatory Visit: Payer: Self-pay

## 2019-04-29 ENCOUNTER — Encounter: Payer: Self-pay | Admitting: Gastroenterology

## 2019-04-29 ENCOUNTER — Telehealth: Payer: Self-pay | Admitting: Gastroenterology

## 2019-04-29 ENCOUNTER — Ambulatory Visit (AMBULATORY_SURGERY_CENTER): Payer: Medicare Other | Admitting: Gastroenterology

## 2019-04-29 ENCOUNTER — Encounter (HOSPITAL_COMMUNITY): Payer: Medicare Other

## 2019-04-29 VITALS — BP 120/45 | HR 58 | Temp 97.8°F | Resp 17

## 2019-04-29 DIAGNOSIS — K2970 Gastritis, unspecified, without bleeding: Secondary | ICD-10-CM

## 2019-04-29 DIAGNOSIS — K2950 Unspecified chronic gastritis without bleeding: Secondary | ICD-10-CM | POA: Diagnosis not present

## 2019-04-29 DIAGNOSIS — K259 Gastric ulcer, unspecified as acute or chronic, without hemorrhage or perforation: Secondary | ICD-10-CM | POA: Diagnosis not present

## 2019-04-29 DIAGNOSIS — B259 Cytomegaloviral disease, unspecified: Secondary | ICD-10-CM | POA: Diagnosis not present

## 2019-04-29 DIAGNOSIS — Z8711 Personal history of peptic ulcer disease: Secondary | ICD-10-CM

## 2019-04-29 DIAGNOSIS — B9681 Helicobacter pylori [H. pylori] as the cause of diseases classified elsewhere: Secondary | ICD-10-CM | POA: Diagnosis not present

## 2019-04-29 DIAGNOSIS — Z8719 Personal history of other diseases of the digestive system: Secondary | ICD-10-CM | POA: Diagnosis not present

## 2019-04-29 MED ORDER — FLUCONAZOLE 100 MG PO TABS: 400.0000 mg | ORAL_TABLET | Freq: Once | ORAL | 0 refills | Status: AC

## 2019-04-29 MED ORDER — FLUCONAZOLE 100 MG PO TABS: 200.0000 mg | ORAL_TABLET | Freq: Every day | ORAL | 0 refills | Status: DC

## 2019-04-29 MED ORDER — SODIUM CHLORIDE 0.9 % IV SOLN: 500.0000 mL | Freq: Once | INTRAVENOUS | Status: DC

## 2019-04-29 NOTE — Progress Notes (Signed)
To PACU, VSS. Report to RN.tb 

## 2019-04-29 NOTE — Op Note (Signed)
[facility] Patient Name: Emily Cannon Procedure Date: 04/29/2019 10:17 AM MRN: [id number] Endoscopist: Remo Lipps P. Havery Moros , MD Age: 85 Referring MD:  Date of Birth: 05-11-1933 Gender: Female Account #: [id number] Procedure:                Upper GI endoscopy Indications:              Follow-up of gastric ulcer secondary to CMV                            gastritis (on valgancyclovir), immunosuppressed on                            chronic steroids, history of esophageal candidiasis Medicines:                Monitored Anesthesia Care Procedure:                Pre-Anesthesia Assessment:                           - Prior to the procedure, a History and Physical                            was performed, and patient medications and                            allergies were reviewed. The patient's tolerance of                            previous anesthesia was also reviewed. The risks                            and benefits of the procedure and the sedation                            options and risks were discussed with the patient.                            All questions were answered, and informed consent                            was obtained. Prior Anticoagulants: The patient has                            taken no previous anticoagulant or antiplatelet                            agents. ASA Grade Assessment: III - A patient with                            severe systemic disease. After reviewing the risks                            and benefits, the patient was deemed in  satisfactory condition to undergo the procedure.                           After obtaining informed consent, the endoscope was                            passed under direct vision. Throughout the                            procedure, the patient's blood pressure, pulse, and                            oxygen saturations were monitored continuously. The     Endoscope was introduced through the mouth, and                            advanced to the second part of duodenum. The upper                            GI endoscopy was accomplished without difficulty.                            The patient tolerated the procedure well. Scope In: Scope Out: Findings:                 Esophagogastric landmarks were identified: the                            Z-line was found at 39 cm, the gastroesophageal                            junction was found at 39 cm and the upper extent of                            the gastric folds was found at 39 cm from the                            incisors.                           Diffuse, white and yellow plaques were found in the                            upper third of the esophagus and in the middle                            third of the esophagus consistent with esophageal                            candidiasis.                           The exam of the esophagus was otherwise normal.  Patchy inflammation characterized by adherent heme,                            erythema and granularity was found in the gastric                            antrum. A focal erosion was noted at the site of                            the prior ulcer - ulcer has healed other than small                            erosion.                           The exam of the stomach was otherwise normal.                           Biopsies were taken with a cold forceps in the                            gastric body, at the incisura and in the gastric                            antrum for histology - rule out CMV / H pylori.                           The duodenal bulb and second portion of the                            duodenum were normal. Complications:            No immediate complications. Estimated blood loss:                            Minimal. Estimated Blood Loss:     Estimated blood loss was minimal. Impression:                - Esophagogastric landmarks identified.                           - Esophageal plaques were found, consistent with                            candidiasis.                           - Gastritis with erosion - interval healing of                            ulcer otherwise                           - Normal duodenal bulb and second portion of the  duodenum.                           - Biopsies were taken with a cold forceps for                            histology in the gastric body, at the incisura and                            in the gastric antrum - rule out CMV, H pylori. Recommendation:           - Patient has a contact number available for                            emergencies. The signs and symptoms of potential                            delayed complications were discussed with the                            patient. Return to normal activities tomorrow.                            Written discharge instructions were provided to the                            patient.                           - Resume previous diet.                           - Continue present medications.                           - Start fluconazole for candidiasis - 400mg  PO x 1                            then 200mg  / day thereafter for 3 weeks. Will                            discuss course / dosing given CKD and recurrent                            candidiasis with Dr. Baxter Flattery of infectious disease                           - Await pathology results. Remo Lipps P. Armbruster, MD 04/29/2019 10:41:44 AM This report has been signed electronically.

## 2019-04-29 NOTE — Patient Instructions (Signed)
Await pathology results.  Start fluconazole for candidiasis - 400mg  by mouth for one day then 200mg  by mouth each day for 3 weeks.  YOU HAD AN ENDOSCOPIC PROCEDURE TODAY AT [facility]:   Refer to the procedure report that was given to you for any specific questions about what was found during the examination.  If the procedure report does not answer your questions, please call your gastroenterologist to clarify.  If you requested that your care partner not be given the details of your procedure findings, then the procedure report has been included in a sealed envelope for you to review at your convenience later.  YOU SHOULD EXPECT: Some feelings of bloating in the abdomen. Passage of more gas than usual.  Walking can help get rid of the air that was put into your GI tract during the procedure and reduce the bloating. If you had a lower endoscopy (such as a colonoscopy or flexible sigmoidoscopy) you may notice spotting of blood in your stool or on the toilet paper. If you underwent a bowel prep for your procedure, you may not have a normal bowel movement for a few days.  Please Note:  You might notice some irritation and congestion in your nose or some drainage.  This is from the oxygen used during your procedure.  There is no need for concern and it should clear up in a day or so.  SYMPTOMS TO REPORT IMMEDIATELY:   Following upper endoscopy (EGD)  Vomiting of blood or coffee ground material  New chest pain or pain under the shoulder blades  Painful or persistently difficult swallowing  New shortness of breath  Fever of 100F or higher  Black, tarry-looking stools  For urgent or emergent issues, a gastroenterologist can be reached at any hour by calling [phone].   DIET:  We do recommend a small meal at first, but then you may proceed to your regular diet.  Drink plenty of fluids but you should avoid alcoholic beverages for 24 hours.  ACTIVITY:  You should plan to  take it easy for the rest of today and you should NOT DRIVE or use heavy machinery until tomorrow (because of the sedation medicines used during the test).    FOLLOW UP: Our staff will call the number listed on your records 48-72 hours following your procedure to check on you and address any questions or concerns that you may have regarding the information given to you following your procedure. If we do not reach you, we will leave a message.  We will attempt to reach you two times.  During this call, we will ask if you have developed any symptoms of COVID 19. If you develop any symptoms (ie: fever, flu-like symptoms, shortness of breath, cough etc.) before then, please call [phone].  If you test positive for Covid 19 in the 2 weeks post procedure, please call and report this information to [place].    If any biopsies were taken you will be contacted by phone or by letter within the next 1-3 weeks.  Please call us at [phone] if you have not heard about the biopsies in 3 weeks.    SIGNATURES/CONFIDENTIALITY: You and/or your care partner have signed paperwork which will be entered into your electronic medical record.  These signatures attest to the fact that that the information above on your After Visit Summary has been reviewed and is understood.  Full responsibility of the confidentiality of this discharge information lies with you and/or  your care-partner.

## 2019-04-29 NOTE — Progress Notes (Signed)
Pt's states no medical or surgical changes since previsit or office visit.  Temp taken by LC VS taken by CW

## 2019-04-29 NOTE — Progress Notes (Signed)
Called to room to assist during endoscopic procedure.  Patient ID and intended procedure confirmed with present staff. Received instructions for my participation in the procedure from the performing physician.  

## 2019-04-30 NOTE — Telephone Encounter (Signed)
Called Marden Noble (son) back,  his questions about his Mom's new med Fluconazole have already been cleared-up by the Pharmacist

## 2019-05-03 ENCOUNTER — Telehealth: Payer: Self-pay

## 2019-05-03 DIAGNOSIS — R42 Dizziness and giddiness: Secondary | ICD-10-CM | POA: Diagnosis not present

## 2019-05-03 DIAGNOSIS — I959 Hypotension, unspecified: Secondary | ICD-10-CM | POA: Diagnosis not present

## 2019-05-03 NOTE — Telephone Encounter (Signed)
  Follow up Call-  Call back number 04/29/2019  Post procedure Call Back phone  # [phone]  Permission to leave phone message Yes  Some recent data might be hidden     Patient questions:  Do you have a fever, pain , or abdominal swelling? No. Pain Score  0 *  Have you tolerated food without any problems? Yes.    Have you been able to return to your normal activities? Yes.    Do you have any questions about your discharge instructions: Diet   No. Medications  No. Follow up visit  No.  Do you have questions or concerns about your Care? No.  Actions: * If pain score is 4 or above: No action needed, pain <4. 1. Have you developed a fever since your procedure? no  2.   Have you had an respiratory symptoms (SOB or cough) since your procedure? no  3.   Have you tested positive for COVID 19 since your procedure no  4.   Have you had any family members/close contacts diagnosed with the COVID 19 since your procedure?  no   If yes to any of these questions please route to Joylene John, RN and Alphonsa Gin, Therapist, sports.

## 2019-05-04 ENCOUNTER — Telehealth: Payer: Self-pay

## 2019-05-04 ENCOUNTER — Other Ambulatory Visit: Payer: Self-pay | Admitting: Family Medicine

## 2019-05-04 NOTE — Telephone Encounter (Signed)
Patient's husband called office today stating yesterday 10/5 he had to contact 911 for his wife. States that yesterday she did not feel well in the stomach, and needed to lye down. Before she could though he states that she lost all color in her face and sat down in chair unresponsive. For 5 mins he states she did not respond to him. When EMS arrived on scene patient was responsive and alert. States vitals were in normal range and patient did not have to go to ER. Patient's husband is concerned this happened from taking the Valcyte. Patient has not had any similar episodes since then, but hasn't taken valcyte today. Patient's husband refuses to let her take this until he is certain this was not related to medication. Will forward message to Pharmacist Cassie and Dr. Baxter Flattery to advise on concerns. [place]

## 2019-05-05 NOTE — Telephone Encounter (Signed)
I spoke to patient and also looked at her recent EGD results. It looks like she was just started on fluc 200mg  daily for candidal esophagitis. The biopsy was negative for CMV. I spoke to husband about the episode that [place] had on 10/5 which sounds like it was syncopal spell. I have asked him to stop giving Audryanna valcyte since bx are clean.

## 2019-05-05 NOTE — Telephone Encounter (Signed)
I'm going to defer this to Dr. Baxter Flattery. I would say it is not from [facility] but this sounds very serious and in need of a physician contact/follow up ASAP.

## 2019-05-05 NOTE — Telephone Encounter (Signed)
Patient's husband called again regarding message left on 10/6. Can someone please call patient to go over concerns.  [place]

## 2019-05-06 ENCOUNTER — Other Ambulatory Visit: Payer: Self-pay

## 2019-05-06 MED ORDER — PYLERA 140-125-125 MG PO CAPS: 3.0000 | ORAL_CAPSULE | Freq: Three times a day (TID) | ORAL | 0 refills | Status: DC

## 2019-05-06 MED ORDER — ESOMEPRAZOLE MAGNESIUM 40 MG PO PACK: 40.0000 mg | PACK | Freq: Two times a day (BID) | ORAL | 3 refills | Status: DC

## 2019-05-10 ENCOUNTER — Other Ambulatory Visit: Payer: Self-pay

## 2019-05-10 ENCOUNTER — Ambulatory Visit: Payer: Medicare Other | Admitting: Internal Medicine

## 2019-05-10 ENCOUNTER — Encounter: Payer: Self-pay | Admitting: Internal Medicine

## 2019-05-10 VITALS — BP 161/65 | HR 56 | Temp 97.9°F

## 2019-05-10 DIAGNOSIS — A048 Other specified bacterial intestinal infections: Secondary | ICD-10-CM

## 2019-05-10 DIAGNOSIS — A084 Viral intestinal infection, unspecified: Secondary | ICD-10-CM | POA: Diagnosis not present

## 2019-05-10 DIAGNOSIS — B3781 Candidal esophagitis: Secondary | ICD-10-CM

## 2019-05-10 DIAGNOSIS — B259 Cytomegaloviral disease, unspecified: Secondary | ICD-10-CM | POA: Diagnosis not present

## 2019-05-10 NOTE — Patient Instructions (Signed)
  Your EGD conducted by dr Havery Moros showed: 1) resolution of CMV gastritis - you do not need to take anymore Valcyte(valganciclovir) 2) you have signs of candida of the esophagus, dr Havery Moros started you on fluconazole 200mg  daily x 3 wk 3) you have  H.pylori - which is associated with gastric ulcers - dr Havery Moros started you on nexium plus pylera x 14d   You won't need to come back to see me unless dr Havery Moros would like. It has been a pleasure to be part of your care.

## 2019-05-10 NOTE — Progress Notes (Signed)
RFV: follow up for cmv gastritis  Patient ID: Emily Cannon, female   DOB: [birth year], 85 y.o.   MRN: [id number]  HPI She underwent repeat EGD which path results showed resolution of CMV infection. They did find evidence of eso candidiasis plus Hpylori. She had episode of syncope. Doing well, she reports feeling better, less epigastric pain Would like copy of egd  Outpatient Encounter Medications as of 05/10/2019  Medication Sig  . amLODipine (NORVASC) 10 MG tablet TAKE ONE TABLET BY MOUTH DAILY  . atorvastatin (LIPITOR) 20 MG tablet TAKE ONE TABLET BY MOUTH DAILY  . bismuth-metronidazole-tetracycline (PYLERA) 140-125-125 MG capsule Take 3 capsules by mouth 4 (four) times daily -  before meals and at bedtime.  . calcitRIOL (ROCALTROL) 0.25 MCG capsule Take 0.25 mcg by mouth every Monday, Wednesday, and Friday.   . cefUROXime (CEFTIN) 250 MG tablet Take 1 tablet (250 mg total) by mouth 2 (two) times daily with a meal.  . Cholecalciferol (VITAMIN D-3 PO) Take 1 capsule by mouth every Monday, Wednesday, and Friday.  . dapsone 100 MG tablet Take 100 mg by mouth every morning.   Marland Kitchen esomeprazole (NEXIUM) 40 MG packet Take 40 mg by mouth 2 (two) times daily before a meal.  . fluconazole (DIFLUCAN) 100 MG tablet Take 2 tablets (200 mg total) by mouth daily.  . furosemide (LASIX) 40 MG tablet Take 0.5 tablets (20 mg total) by mouth 3 (three) times a week. Monday, Wed, Friday (Patient taking differently: Take 40 mg by mouth every Monday, Wednesday, and Friday. )  . gemfibrozil (LOPID) 600 MG tablet TAKE ONE TABLET BY MOUTH DAILY  . hydrALAZINE (APRESOLINE) 50 MG tablet Take 1 tablet (50 mg total) by mouth 3 (three) times daily.  . hydrochlorothiazide (MICROZIDE) 12.5 MG capsule Take 12.5 mg by mouth daily.  . Hydrocortisone (GERHARDT'S BUTT CREAM) CREA Apply 1 application topically 3 (three) times daily. (Patient taking differently: Apply 1 application topically 3 (three) times daily. TO AFFECTED  SITES)  . levothyroxine (SYNTHROID) 50 MCG tablet TAKE ONE TABLET BY MOUTH EVERY MORNING BEFORE BREAKFAST  . magnesium oxide (MAG-OX) 400 MG tablet Take 400 mg by mouth daily with breakfast.  . metoprolol succinate (TOPROL-XL) 50 MG 24 hr tablet TAKE ONE TABLET BY MOUTH TWICE A DAY  . Multiple Vitamins-Minerals (CENTRUM SILVER 50+WOMEN) TABS Take 1 tablet by mouth daily with breakfast.  . predniSONE (DELTASONE) 10 MG tablet On a weaning dose thru 06/2019  10 mg daily thru 9/30                                                            7.5 mg ends 10/30                                                             5 mg ends 11/29  2.5 mg thru 12/29 then will stop  . sulfamethoxazole-trimethoprim (BACTRIM DS) 800-160 MG tablet   . valGANciclovir (VALCYTE) 450 MG tablet Take 1 tablet (450 mg total) by mouth every other day.  . pantoprazole (PROTONIX) 40 MG tablet Take 1 tablet (40 mg total) by mouth 2 (two) times daily for 28 days.   No facility-administered encounter medications on file as of 05/10/2019.      Patient Active Problem List   Diagnosis Date Noted  . Heme positive stool   . Benign neoplasm of colon   . Acute blood loss anemia 12/30/2018  . Symptomatic anemia 12/15/2018  . AKI (acute kidney injury) ([place]) 12/15/2018  . Hypomagnesemia 12/15/2018  . Steroid-induced hyperglycemia 12/15/2018  . Microscopic polyangiitis ([place]) 11/13/2018  . Positive P-ANCA titer 10/14/2018  . Viral URI 05/12/2018  . Syncope 06/05/2016  . Unspecified venous (peripheral) insufficiency 11/23/2012  . Dermatitis 11/10/2012  . Hypothyroidism 05/22/2011  . TYMPANIC MEMBRANE PERFORATION, LEFT EAR 08/08/2007  . Osteopenia 07/27/2007  . Hyperlipidemia 05/11/2007  . Anxiety state 05/11/2007  . Essential hypertension 05/11/2007  . ALLERGIC RHINITIS 05/11/2007  . Irritable bowel syndrome 05/11/2007  . LOW BACK PAIN 05/11/2007  . HYSTERECTOMY, HX  OF 05/11/2007     There are no preventive care reminders to display for this patient.   Review of Systems Review of Systems  Constitutional: Negative for fever, chills, diaphoresis, activity change, appetite change, fatigue and unexpected weight change.  HENT: Negative for congestion, sore throat, rhinorrhea, sneezing, trouble swallowing and sinus pressure.  Eyes: Negative for photophobia and visual disturbance.  Respiratory: Negative for cough, chest tightness, shortness of breath, wheezing and stridor.  Cardiovascular: Negative for chest pain, palpitations and leg swelling.  Gastrointestinal: Negative for nausea, vomiting, abdominal pain, diarrhea, constipation, blood in stool, abdominal distention and anal bleeding.  Genitourinary: Negative for dysuria, hematuria, flank pain and difficulty urinating.  Musculoskeletal: Negative for myalgias, back pain, joint swelling, arthralgias and gait problem.  Skin: Negative for color change, pallor, rash and wound.  Neurological: Negative for dizziness, tremors, weakness and light-headedness.  Hematological: Negative for adenopathy. Does not bruise/bleed easily.  Psychiatric/Behavioral: Negative for behavioral problems, confusion, sleep disturbance, dysphoric mood, decreased concentration and agitation.    Physical Exam   BP (!) 161/65   Pulse (!) 56   Temp 97.9 F (36.6 C)   Physical Exam  Constitutional:  oriented to person, place, and time. appears well-developed and well-nourished. No distress.  HENT: [place]/AT, PERRLA, no scleral icterus Mouth/Throat: Oropharynx is clear and moist. No oropharyngeal exudate.  Cardiovascular: Normal rate, regular rhythm and normal heart sounds. Exam reveals no gallop and no friction rub.  No murmur heard.  Pulmonary/Chest: Effort normal and breath sounds normal. No respiratory distress.  has no wheezes.  Neck = supple, no nuchal rigidity Abdominal: Soft. Bowel sounds are normal.  exhibits no distension.  There is no tenderness.  Lymphadenopathy: no cervical adenopathy. No axillary adenopathy Neurological: alert and oriented to person, place, and time.  Skin: Skin is warm and dry. Scattered echymosis Psychiatric: a normal mood and affect.  behavior is normal.   CBC Lab Results  Component Value Date   WBC 12.4 (H) 04/14/2019   RBC 3.92 04/14/2019   HGB 11.9 04/14/2019   HCT 35.9 04/14/2019   PLT 195 04/14/2019   MCV 91.6 04/14/2019   MCH 30.4 04/14/2019   MCHC 33.1 04/14/2019   RDW 15.9 (H) 04/14/2019   LYMPHSABS 1,637 04/14/2019   MONOABS 0.3 12/19/2018   EOSABS 87  04/14/2019    BMET Lab Results  Component Value Date   NA 138 03/25/2019   K 4.1 03/25/2019   CL 101 03/25/2019   CO2 25 03/25/2019   GLUCOSE 118 (H) 03/25/2019   BUN 35 (H) 03/25/2019   CREATININE 1.98 (H) 03/25/2019   CALCIUM 9.1 03/25/2019   GFRNONAA 20 (L) 01/03/2019   GFRAA 23 (L) 01/03/2019      Assessment and Plan  cmv gastritits = appears to have resolved. Have instructed to stop taking ganciclovir  H.pylori = to finish 14d course of pylera per dr Havery Moros  eso candidiasis = continue with fluconazole 200mg  daily x 14-21d

## 2019-05-12 ENCOUNTER — Ambulatory Visit: Payer: Medicare Other | Admitting: Internal Medicine

## 2019-05-19 ENCOUNTER — Other Ambulatory Visit: Payer: Self-pay | Admitting: Gastroenterology

## 2019-05-19 DIAGNOSIS — K2970 Gastritis, unspecified, without bleeding: Secondary | ICD-10-CM

## 2019-05-19 DIAGNOSIS — Z8711 Personal history of peptic ulcer disease: Secondary | ICD-10-CM

## 2019-05-19 DIAGNOSIS — B259 Cytomegaloviral disease, unspecified: Secondary | ICD-10-CM

## 2019-05-19 DIAGNOSIS — K259 Gastric ulcer, unspecified as acute or chronic, without hemorrhage or perforation: Secondary | ICD-10-CM

## 2019-05-19 DIAGNOSIS — Z8719 Personal history of other diseases of the digestive system: Secondary | ICD-10-CM

## 2019-05-20 DIAGNOSIS — M317 Microscopic polyangiitis: Secondary | ICD-10-CM | POA: Diagnosis not present

## 2019-05-21 ENCOUNTER — Other Ambulatory Visit: Payer: Self-pay | Admitting: Gastroenterology

## 2019-05-21 DIAGNOSIS — K259 Gastric ulcer, unspecified as acute or chronic, without hemorrhage or perforation: Secondary | ICD-10-CM

## 2019-05-21 DIAGNOSIS — Z8719 Personal history of other diseases of the digestive system: Secondary | ICD-10-CM

## 2019-05-21 DIAGNOSIS — B259 Cytomegaloviral disease, unspecified: Secondary | ICD-10-CM

## 2019-05-21 DIAGNOSIS — Z8711 Personal history of peptic ulcer disease: Secondary | ICD-10-CM

## 2019-05-21 DIAGNOSIS — K2970 Gastritis, unspecified, without bleeding: Secondary | ICD-10-CM

## 2019-05-24 ENCOUNTER — Telehealth: Payer: Self-pay | Admitting: Gastroenterology

## 2019-05-24 ENCOUNTER — Other Ambulatory Visit: Payer: Self-pay | Admitting: Family Medicine

## 2019-05-24 NOTE — Telephone Encounter (Signed)
Called and spoke to pt's husband, [name].  Let him know that Fluconazole prescription was for 3 weeks and she does not need to continue on that once script is gone. She finished her last dose the end of last week and has had diarrhea. He would like to know what she can take. They have Kaopectate at home.  Can she try that?  It is hard for him to get out to the store.  He is concerned about what to give her given all her "issues" and the medications she is taking.  Please advise. Thank you

## 2019-05-24 NOTE — Telephone Encounter (Signed)
Thanks Jan. No further fluconazole needed. I did also treat her for H pylori with Pylera recently. Can you clarify if she has taken that / finished it? If the diarrhea is severe, given her recent antibiotic use, we may want to have her tested for C diff. If diarrhea is mild, she can try a probiotic and some immodium PRN. thanks

## 2019-05-24 NOTE — Telephone Encounter (Signed)
Called pt and spoke to husband. He indicated that she did take all the medications that were prescribed.  She has not tried Imodium so he will have her try that and a probiotic for several days and assured me he will call us back if she is not improved.

## 2019-05-24 NOTE — Telephone Encounter (Signed)
Husband inquired whether pt needs refill for fluconazole.

## 2019-05-25 ENCOUNTER — Other Ambulatory Visit: Payer: Self-pay | Admitting: Family Medicine

## 2019-06-01 DIAGNOSIS — M317 Microscopic polyangiitis: Secondary | ICD-10-CM | POA: Diagnosis not present

## 2019-06-01 DIAGNOSIS — Z6823 Body mass index (BMI) 23.0-23.9, adult: Secondary | ICD-10-CM | POA: Diagnosis not present

## 2019-06-02 DIAGNOSIS — B029 Zoster without complications: Secondary | ICD-10-CM | POA: Diagnosis not present

## 2019-06-14 DIAGNOSIS — M317 Microscopic polyangiitis: Secondary | ICD-10-CM | POA: Diagnosis not present

## 2019-06-28 DIAGNOSIS — N184 Chronic kidney disease, stage 4 (severe): Secondary | ICD-10-CM | POA: Diagnosis not present

## 2019-07-07 ENCOUNTER — Other Ambulatory Visit: Payer: Self-pay | Admitting: Family Medicine

## 2019-07-09 ENCOUNTER — Ambulatory Visit (INDEPENDENT_AMBULATORY_CARE_PROVIDER_SITE_OTHER): Payer: Medicare Other | Admitting: Gastroenterology

## 2019-07-09 ENCOUNTER — Encounter: Payer: Self-pay | Admitting: Gastroenterology

## 2019-07-09 ENCOUNTER — Other Ambulatory Visit: Payer: Self-pay

## 2019-07-09 VITALS — BP 112/50 | HR 62 | Temp 97.8°F | Ht 60.0 in | Wt 126.1 lb

## 2019-07-09 DIAGNOSIS — R110 Nausea: Secondary | ICD-10-CM

## 2019-07-09 DIAGNOSIS — N2581 Secondary hyperparathyroidism of renal origin: Secondary | ICD-10-CM | POA: Diagnosis not present

## 2019-07-09 DIAGNOSIS — D649 Anemia, unspecified: Secondary | ICD-10-CM | POA: Diagnosis not present

## 2019-07-09 DIAGNOSIS — I129 Hypertensive chronic kidney disease with stage 1 through stage 4 chronic kidney disease, or unspecified chronic kidney disease: Secondary | ICD-10-CM | POA: Diagnosis not present

## 2019-07-09 DIAGNOSIS — Z8619 Personal history of other infectious and parasitic diseases: Secondary | ICD-10-CM | POA: Diagnosis not present

## 2019-07-09 DIAGNOSIS — N1832 Chronic kidney disease, stage 3b: Secondary | ICD-10-CM | POA: Diagnosis not present

## 2019-07-09 DIAGNOSIS — R801 Persistent proteinuria, unspecified: Secondary | ICD-10-CM | POA: Diagnosis not present

## 2019-07-09 DIAGNOSIS — R194 Change in bowel habit: Secondary | ICD-10-CM

## 2019-07-09 MED ORDER — ONDANSETRON 4 MG PO TBDP: 4.0000 mg | ORAL_TABLET | Freq: Three times a day (TID) | ORAL | 0 refills | Status: DC | PRN

## 2019-07-09 NOTE — Patient Instructions (Addendum)
Your provider has requested that you go to the basement level for lab work before leaving today. Press "B" on the elevator. The lab is located at the first door on the left as you exit the elevator.  We have sent the following medications to your pharmacy for you to pick up at your convenience: Zofran 4 mg as needed for nausea  Stop Nexium for 2 weeks before giving stool specimen.  Please call the office if you have a return in nausea/bowel change symptoms in the next one week and ask for Hughie Closs, RN, BSN.   Call Dr. Elease Hashimoto about episodes of confusion.

## 2019-07-09 NOTE — Progress Notes (Signed)
HPI :  85 year old female here for a follow-up visit.  She has medical history significant for chronic kidney disease and chronic rheumatic disease, microscopic polyangiitis, on rituximab, who is admitted to the hospital in June of this past year with symptomatic anemia, hemoglobin to the fives and sixes with periodic overt rectal bleeding.  She underwent an EGD and colonoscopy in June while admitted.  Remarkable findings included a gastric ulcer in the setting of CMV gastritis and her immunosuppressed state, she also had severe esophageal candidiasis.  Her colonoscopy was remarkable for a few small polyps and inflamed hemorrhoids. She was seen by infectious disease and was given valganciclovir for the CMV as well as PPI and fluconazole for the esophageal candidiasis.  She followed up with me for repeat endoscopy in October.  She again had esophageal candidiasis for which she was treated again.  She had some inflammation in her stomach, biopsy showed no evidence of CMV, however she did test positive for H. pylori.  She was given a course of Pylera for this along with Nexium 40 mg twice a day.  She has since stopped the valganciclovir and has completed her Pylera last month.  Generally she is doing much better since of last seen her.  She states she is feeling better.  Her renal disease appears to be stable and she is happy about that.  When she was on the Pylera she did have a couple episodes of nausea and diarrhea that bothered her.  Her husband states she may have had some syncopal episodes in the setting and seem to lose her consciousness for a few minutes at a time.  There she was then awake and appeared normal to him.  She has not seen her primary care cardiologist about this.  It last occurred about a month ago when she was taking her antibiotics.  She has not had any problems with diarrhea since she stopped the Pylera.  She still has occasional nausea but no vomiting.  She continues to take rituximab  per Dr. Amil Amen.   EGD 04/29/19 -  Findings: - Diffuse, white and yellow plaques were found in the upper third of the esophagus and in the middle third of the esophagus consistent with esophageal candidiasis. - The exam of the esophagus was otherwise normal. - Patchy inflammation characterized by adherent heme, erythema and granularity was found in the gastric antrum. A focal erosion was noted at the site of the prior ulcer - ulcer has healed other than small erosion. - The exam of the stomach was otherwise normal. - Biopsies were taken with a cold forceps in the gastric body, at the incisura and in the gastric antrum for histology - rule out CMV / H pylori. - The duodenal bulb and second portion of the duodenum were normal.  Biopsies positive for H pylori, negative for CMV  EGD 01/02/19 Esophagogastric landmarks identified. - Esophageal plaques were found, consistent with candidiasis. - Non-bleeding gastric ulcer with no stigmata of bleeding - biopsies c/w CMV gastritis - Normal duodenal bulb and second portion of the duodenum. - Biopsies were taken with a cold forceps for Helicobacter pylori testing.  Colonoscopy 01/02/19 - Perianal dermatitis found on perianal exam. - The examined portion of the ileum was normal. - Two 3 mm polyps in the sigmoid colon, removed with a cold biopsy forceps. Resected and retrieved. - Internal hemorrhoids with inflammatory changes. - Several minutes spent lavaging the colon to achieve adequate views. - The examination was otherwise normal. Inflamed  internal hemorrhoids are causing rectal bleeding, I suspect ulcer on EGD may be more likely the cause of worsening anemia with component of hemorrhoidal bleeding.   Past Medical History:  Diagnosis Date  . Allergic rhinitis, cause unspecified   . Allergy   . Anxiety state, unspecified   . Blood transfusion without reported diagnosis    2020  . Cataract    removed both eyes   . Chronic kidney disease     . Disorder of bone and cartilage, unspecified   . Esophageal candidiasis ([place])   . Gastric ulcer   . History of CMV   . History of Helicobacter pylori infection   . Hypothyroid   . Irritable bowel syndrome   . Lumbago   . Microscopic polyangiitis ([place])   . Other and unspecified hyperlipidemia   . Perforation of tympanic membrane, unspecified   . Unspecified essential hypertension      Past Surgical History:  Procedure Laterality Date  . ABDOMINAL HYSTERECTOMY    . BIOPSY  01/02/2019   Procedure: BIOPSY;  Surgeon: Yetta Flock, MD;  Location: [facility];  Service: Gastroenterology;;  . cataract surgery  5/12 and 12/24/2012   both eyes  . COLONOSCOPY    . COLONOSCOPY WITH PROPOFOL N/A 01/02/2019   Procedure: COLONOSCOPY WITH PROPOFOL;  Surgeon: Yetta Flock, MD;  Location: [place];  Service: Gastroenterology;  Laterality: N/A;  . ESOPHAGOGASTRODUODENOSCOPY (EGD) WITH PROPOFOL N/A 01/02/2019   Procedure: ESOPHAGOGASTRODUODENOSCOPY (EGD) WITH PROPOFOL;  Surgeon: Yetta Flock, MD;  Location: [place];  Service: Gastroenterology;  Laterality: N/A;  . NSVD     x1  . POLYPECTOMY  01/02/2019   Procedure: POLYPECTOMY;  Surgeon: Yetta Flock, MD;  Location: [facility] ENDOSCOPY;  Service: Gastroenterology;;  . POLYPECTOMY    . TONSILLECTOMY    . UPPER GASTROINTESTINAL ENDOSCOPY     Family History  Problem Relation Age of Onset  . Lung cancer Mother 55  . Lung disease Father 90  . COPD Other        sibling  . Colon cancer Neg Hx   . Colon polyps Neg Hx   . Esophageal cancer Neg Hx   . Rectal cancer Neg Hx   . Stomach cancer Neg Hx    Social History   Tobacco Use  . Smoking status: Never Smoker  . Smokeless tobacco: Never Used  Substance Use Topics  . Alcohol use: No  . Drug use: No   Current Outpatient Medications  Medication Sig Dispense Refill  . amLODipine (NORVASC) 10 MG tablet TAKE ONE TABLET BY MOUTH DAILY 90 tablet 0  . atorvastatin  (LIPITOR) 20 MG tablet TAKE ONE TABLET BY MOUTH DAILY 90 tablet 0  . Cholecalciferol (VITAMIN D-3 PO) Take 1 capsule by mouth every Monday, Wednesday, and Friday.    . esomeprazole (NEXIUM) 40 MG packet Take 40 mg by mouth 2 (two) times daily before a meal. 60 each 3  . furosemide (LASIX) 40 MG tablet Take 0.5 tablets (20 mg total) by mouth 3 (three) times a week. Monday, Wed, Friday (Patient taking differently: Take 40 mg by mouth every Monday, Wednesday, and Friday. ) 30 tablet   . hydrALAZINE (APRESOLINE) 50 MG tablet TAKE ONE TABLET BY MOUTH THREE TIMES A DAY 270 tablet 0  . levothyroxine (SYNTHROID) 50 MCG tablet TAKE ONE TABLET BY MOUTH EVERY MORNING BEFORE BREAKFAST 90 tablet 0  . metoprolol succinate (TOPROL-XL) 50 MG 24 hr tablet TAKE ONE TABLET BY MOUTH TWICE A DAY 180  tablet 0  . Multiple Vitamins-Minerals (CENTRUM SILVER 50+WOMEN) TABS Take 1 tablet by mouth daily with breakfast.    . predniSONE 5 MG TBEC Take 5 mg by mouth daily.     Marland Kitchen sulfamethoxazole-trimethoprim (BACTRIM DS) 800-160 MG tablet Take 1 tablet by mouth 3 (three) times a week.     . calcitRIOL (ROCALTROL) 0.25 MCG capsule Take 0.25 mcg by mouth every Monday, Wednesday, and Friday.     . ondansetron (ZOFRAN ODT) 4 MG disintegrating tablet Take 1 tablet (4 mg total) by mouth every 8 (eight) hours as needed for nausea or vomiting. 20 tablet 0   No current facility-administered medications for this visit.   Allergies  Allergen Reactions  . Alendronate Sodium Shortness Of Breath  . Amoxicillin Diarrhea    Severe diarrhea  . Codeine Nausea Only  . Valsartan Swelling    Gums/throat swell     Review of Systems: All systems reviewed and negative except where noted in HPI.   Lab Results  Component Value Date   WBC 12.4 (H) 04/14/2019   HGB 11.9 04/14/2019   HCT 35.9 04/14/2019   MCV 91.6 04/14/2019   PLT 195 04/14/2019    Lab Results  Component Value Date   CREATININE 1.98 (H) 03/25/2019   BUN 35 (H)  03/25/2019   NA 138 03/25/2019   K 4.1 03/25/2019   CL 101 03/25/2019   CO2 25 03/25/2019    Lab Results  Component Value Date   ALT 31 12/15/2018   AST 32 12/15/2018   ALKPHOS 41 12/15/2018   BILITOT 0.9 12/15/2018     Physical Exam: BP (!) 112/50 (BP Location: Left Arm, Patient Position: Sitting, Cuff Size: Normal)   Pulse 62   Temp 97.8 F (36.6 C)   Ht 5' (1.524 m)   Wt 126 lb 2 oz (57.2 kg)   BMI 24.63 kg/m  Constitutional: Pleasant,well-developed, female in no acute distress. HEENT: Normocephalic and atraumatic. Conjunctivae are normal. No scleral icterus. Neck supple.  Cardiovascular: Normal rate, regular rhythm.  Pulmonary/chest: Effort normal and breath sounds normal. No wheezing, rales or rhonchi. Abdominal: Soft, nondistended, nontender.  There are no masses palpable. No hepatomegaly. Extremities: (+) 1 LE edema bilaterally Lymphadenopathy: No cervical adenopathy noted. Neurological: Alert and oriented to person place and time. Skin: Skin is warm and dry. No rashes noted. Psychiatric: Normal mood and affect. Behavior is normal.   ASSESSMENT AND PLAN: 85 year old female here for reassessment of the following:  History of H. Pylori / history of CMV gastritis / history of esophageal candidiasis / nausea - as above, previously admitted with anemia and bleeding due to gastric ulcer.  Initially treated for CMV gastritis and on follow-up EGD her ulcer had resolved, however she then tested positive for H. Pylori.  She was treated with Pylera for 10 days, I think this may have caused some of the nausea and episodic diarrhea that she had, although she did have a few syncopal episodes during this time which is concerning.  I told her that she should follow-up with her primary care Dr. Elease Hashimoto to ensure he is aware of this and to determine if further work-up is needed with cardiology.  I am not sure if she had vasovagal or became hypotensive from diarrhea to cause this.  She  has not had any problems with diarrhea for the past month and no further syncope.  She has some occasional nausea.  She does warrant H. pylori eradication testing.  I asked her to  stop the Nexium for 2 weeks and then submit an H. pylori stool antigen test to test for education.  If it comes back negative she can then resume Nexium once daily or as needed to control any reflux symptoms.  I will give her some Zofran to use as needed for nausea. She agreed.   Rome Cellar, MD [facility]

## 2019-07-16 ENCOUNTER — Ambulatory Visit (INDEPENDENT_AMBULATORY_CARE_PROVIDER_SITE_OTHER): Payer: Medicare Other | Admitting: Family Medicine

## 2019-07-16 ENCOUNTER — Other Ambulatory Visit: Payer: Self-pay

## 2019-07-16 ENCOUNTER — Encounter: Payer: Self-pay | Admitting: Family Medicine

## 2019-07-16 VITALS — BP 126/62 | HR 54 | Temp 97.6°F | Wt 125.0 lb

## 2019-07-16 DIAGNOSIS — I10 Essential (primary) hypertension: Secondary | ICD-10-CM

## 2019-07-16 DIAGNOSIS — E7800 Pure hypercholesterolemia, unspecified: Secondary | ICD-10-CM

## 2019-07-16 DIAGNOSIS — R55 Syncope and collapse: Secondary | ICD-10-CM | POA: Diagnosis not present

## 2019-07-16 DIAGNOSIS — E039 Hypothyroidism, unspecified: Secondary | ICD-10-CM

## 2019-07-16 DIAGNOSIS — M317 Microscopic polyangiitis: Secondary | ICD-10-CM | POA: Diagnosis not present

## 2019-07-16 NOTE — Patient Instructions (Signed)
Near-Syncope Near-syncope is when you suddenly feel like you might pass out (faint), but you do not actually lose consciousness. This may also be referred to as presyncope. During an episode of near-syncope, you may:  Feel dizzy, weak, or light-headed.  Feel nauseous.  See all white or all black in your field of vision, or see spots.  Have cold, clammy skin. This condition is caused by a sudden decrease in blood flow to the brain. This decrease can result from various causes, but most of those causes are not dangerous. However, near-syncope may be a sign of a serious medical problem, so it is important to seek medical care. Follow these instructions at home: Medicines  Take over-the-counter and prescription medicines only as told by your health care provider.  If you are taking blood pressure or heart medicine, get up slowly and take several minutes to sit and then stand. This can reduce dizziness. General instructions  Pay attention to any changes in your symptoms.  Talk with your health care provider about your symptoms. You may need to have testing to understand the cause of your near-syncope.  If you start to feel like you might faint, lie down right away and raise (elevate) your feet above the level of your heart. Breathe deeply and steadily. Wait until all of the symptoms have passed.  Have someone stay with you until you feel stable.  Do not drive, use machinery, or play sports until your health care provider says it is okay.  Drink enough fluid to keep your urine pale yellow.  Keep all follow-up visits as told by your health care provider. This is important. Get help right away if you:  Have a seizure.  Have unusual pain in your chest, abdomen, or back.  Faint once or repeatedly.  Have a severe headache.  Are bleeding from your mouth or rectum, or you have black or tarry stool.  Have a very fast or irregular heartbeat (palpitations).  Are confused.  Have  trouble walking.  Have severe weakness.  Have vision problems. These symptoms may represent a serious problem that is an emergency. Do not wait to see if your symptoms will go away. Get medical help right away. Call your local emergency services (911 in the [place] Do not drive yourself to the hospital. Summary  Near-syncope is when you suddenly feel like you might pass out (faint), but you do not actually lose consciousness.  This condition is caused by a sudden decrease in blood flow to the brain. This decrease can result from various causes, but most of those causes are not dangerous.  Near-syncope may be a sign of a serious medical problem, so it is important to seek medical care. This information is not intended to replace advice given to you by your health care provider. Make sure you discuss any questions you have with your health care provider. Document Released: 07/15/2005 Document Revised: 11/06/2018 Document Reviewed: 06/03/2018 Elsevier Patient Education  2020 [employer].

## 2019-07-16 NOTE — Progress Notes (Signed)
Subjective:     Patient ID: Emily Cannon, female   DOB: 05/20/1933, 85 y.o.   MRN: [id number]  HPI Emily Cannon has multiple chronic medical problems including hypertension, microscopic polyangiitis, irritable bowel syndrome, hypothyroidism, osteopenia, history of acute kidney injury, hyperlipidemia.  She has had a few episodes in the past several months of near syncope.  Most recent was about a month ago.  Each episode she has had some nausea without vomiting and some diarrhea.  She recalled some lower abdominal cramping.  She felt very dizzy and near syncope but no actual loss of consciousness.  No seizure activity.  No significant confusion.  Emily Cannon had called EMS couple of occasions that she had EKG and blood pressure were stable by the time they arrived.  Regarding her microscopic polyangiitis she is followed by nephrology and rheumatology.  She is getting infusion therapy monthly and is on prednisone 5 mg daily.  She is had some dramatic improvements in recent months.  Her appetite is improving.  She feels much stronger.  Better balance.  She is walking more.  Flu vaccine already received  Past Medical History:  Diagnosis Date  . Allergic rhinitis, cause unspecified   . Allergy   . Anxiety state, unspecified   . Blood transfusion without reported diagnosis    2020  . Cataract    removed both eyes   . Chronic kidney disease   . Disorder of bone and cartilage, unspecified   . Esophageal candidiasis ([place])   . Gastric ulcer   . History of CMV   . History of Helicobacter pylori infection   . Hypothyroid   . Irritable bowel syndrome   . Lumbago   . Microscopic polyangiitis ([place])   . Other and unspecified hyperlipidemia   . Perforation of tympanic membrane, unspecified   . Unspecified essential hypertension    Past Surgical History:  Procedure Laterality Date  . ABDOMINAL HYSTERECTOMY    . BIOPSY  01/02/2019   Procedure: BIOPSY;  Surgeon: Yetta Flock, MD;  Location: [facility]  ENDOSCOPY;  Service: Gastroenterology;;  . cataract surgery  5/12 and 12/24/2012   both eyes  . COLONOSCOPY    . COLONOSCOPY WITH PROPOFOL N/A 01/02/2019   Procedure: COLONOSCOPY WITH PROPOFOL;  Surgeon: Yetta Flock, MD;  Location: [place];  Service: Gastroenterology;  Laterality: N/A;  . ESOPHAGOGASTRODUODENOSCOPY (EGD) WITH PROPOFOL N/A 01/02/2019   Procedure: ESOPHAGOGASTRODUODENOSCOPY (EGD) WITH PROPOFOL;  Surgeon: Yetta Flock, MD;  Location: [place];  Service: Gastroenterology;  Laterality: N/A;  . NSVD     x1  . POLYPECTOMY  01/02/2019   Procedure: POLYPECTOMY;  Surgeon: Yetta Flock, MD;  Location: [facility] ENDOSCOPY;  Service: Gastroenterology;;  . POLYPECTOMY    . TONSILLECTOMY    . UPPER GASTROINTESTINAL ENDOSCOPY      reports that she has never smoked. She has never used smokeless tobacco. She reports that she does not drink alcohol or use drugs. family history includes COPD in an other family member; Lung cancer (age of onset: 23) in her mother; Lung disease (age of onset: 14) in her father. Allergies  Allergen Reactions  . Alendronate Sodium Shortness Of Breath  . Amoxicillin Diarrhea    Severe diarrhea  . Codeine Nausea Only  . Valsartan Swelling    Gums/throat swell   Wt Readings from Last 3 Encounters:  07/16/19 125 lb (56.7 kg)  07/09/19 126 lb 2 oz (57.2 kg)  04/22/19 125 lb (56.7 kg)     Review of  Systems  Constitutional: Negative for appetite change, chills, fever and unexpected weight change.  Respiratory: Negative for cough and shortness of breath.   Cardiovascular: Negative for chest pain, palpitations and leg swelling.  Gastrointestinal: Negative for blood in stool.  Genitourinary: Negative for dysuria.  Neurological: Negative for headaches.  Hematological: Negative for adenopathy. Does not bruise/bleed easily.  Psychiatric/Behavioral: Negative for confusion.       Objective:   Physical Exam Vitals reviewed.   Constitutional:      Appearance: Normal appearance.  Cardiovascular:     Rate and Rhythm: Normal rate and regular rhythm.  Pulmonary:     Effort: Pulmonary effort is normal.     Breath sounds: Normal breath sounds.  Musculoskeletal:     Right lower leg: No edema.     Left lower leg: No edema.  Neurological:     General: No focal deficit present.     Mental Status: She is alert and oriented to person, place, and time.     Cranial Nerves: No cranial nerve deficit.     Motor: No weakness.     Coordination: Coordination normal.        Assessment:     #1 history of microscopic polyangiitis currently stable and followed closely by rheumatology.  They are getting regular lab work for monitoring  #2 hypertension stable and at goal.  No recent orthostatic symptoms  #3 history of acute kidney injury followed by nephrology  #4 recurrent near syncopal episodes.  These have occurred in the setting of fairly severe nausea and suspect vasovagal.  Nonfocal neuro exam.  Does not sound like seizure activity.  She has had no associated focal weakness or confusion  #5 hypothyroidism.  Recent TSH at goal  #6 hyperlipidemia treated with Lipitor    Plan:     -Discussed vasovagal syncope.  Be in touch if any recurrent syncopal episodes or if anything new symptom was such as tonic-clonic activity, etc.  -We elected not to get labs today since she is getting these monitor regularly through rheumatology and nephrology.  -Continue current medications.  These were reviewed with patient at length  -We will recommend 17-month follow-up.  Recheck thyroid and lipids at that time  Eulas Post MD [facility]

## 2019-07-22 ENCOUNTER — Other Ambulatory Visit: Payer: Medicare Other

## 2019-07-22 DIAGNOSIS — Z8619 Personal history of other infectious and parasitic diseases: Secondary | ICD-10-CM

## 2019-07-26 LAB — HELICOBACTER PYLORI  SPECIAL ANTIGEN
MICRO NUMBER:: 1230420
SPECIMEN QUALITY: ADEQUATE

## 2019-07-30 ENCOUNTER — Other Ambulatory Visit: Payer: Self-pay | Admitting: Family Medicine

## 2019-07-31 ENCOUNTER — Other Ambulatory Visit: Payer: Self-pay | Admitting: Family Medicine

## 2019-08-11 ENCOUNTER — Ambulatory Visit: Payer: Medicare Other

## 2019-08-14 ENCOUNTER — Other Ambulatory Visit: Payer: Self-pay | Admitting: Family Medicine

## 2019-08-19 ENCOUNTER — Other Ambulatory Visit: Payer: Self-pay | Admitting: Family Medicine

## 2019-08-23 ENCOUNTER — Ambulatory Visit (INDEPENDENT_AMBULATORY_CARE_PROVIDER_SITE_OTHER): Payer: Medicare Other

## 2019-08-23 DIAGNOSIS — Z1231 Encounter for screening mammogram for malignant neoplasm of breast: Secondary | ICD-10-CM | POA: Diagnosis not present

## 2019-08-23 DIAGNOSIS — Z0000 Encounter for general adult medical examination without abnormal findings: Secondary | ICD-10-CM

## 2019-08-23 NOTE — Progress Notes (Signed)
This visit is being conducted via phone call due to the COVID-19 pandemic. This patient has given me verbal consent via phone to conduct this visit, patient states they are participating from their home address. Some vital signs may be absent or patient reported.   Patient identification: identified by name, DOB, and current address.  Location provider: [facility], Office Persons participating in the virtual visit: Mrs. Sujata Maines and Franne Forts, LPN.   Subjective:   Emily Cannon is a 86 y.o. female who presents for Medicare Annual (Subsequent) preventive examination.  She is back to walking 10 minutes daily in driveway and is using cane. She eats 3 meals per day. Husband provides great deal of support and help at home. She has recently started driving again.   Review of Systems:  NO ROS; Annual Medicare Wellness Visit  Cardiac Risk Factors include: advanced age (>21men, >26 women);dyslipidemia;sedentary lifestyle     Objective:    Vitals: BP 140/70 Comment: patient reported  Ht 5' (1.524 m)   Wt 125 lb (56.7 kg)   BMI 24.41 kg/m   Body mass index is 24.41 kg/m.  Advanced Directives 08/23/2019 04/14/2019 12/31/2018 12/30/2018 12/15/2018 04/08/2018 08/08/2016  Does Patient Have a Medical Advance Directive? No No No No No No No  Would patient like information on creating a medical advance directive? No - Patient declined No - Patient declined No - Patient declined - No - Patient declined - -    Tobacco Social History   Tobacco Use  Smoking Status Never Smoker  Smokeless Tobacco Never Used     Counseling given: Not Answered   Clinical Intake:  Pre-visit preparation completed: Yes  Pain : No/denies pain     BMI - recorded: 24.41 Nutritional Status: BMI of 19-24  Normal Nutritional Risks: None Diabetes: No  How often do you need to have someone help you when you read instructions, pamphlets, or other written materials from your doctor or pharmacy?: 1 -  Never What is the last grade level you completed in school?: 12th  Interpreter Needed?: No  Information entered by :: Franne Forts, LPN.  Past Medical History:  Diagnosis Date  . Allergic rhinitis, cause unspecified   . Allergy   . Anxiety state, unspecified   . Blood transfusion without reported diagnosis    2020  . Cataract    removed both eyes   . Chronic kidney disease   . Disorder of bone and cartilage, unspecified   . Esophageal candidiasis ([place])   . Gastric ulcer   . History of CMV   . History of Helicobacter pylori infection   . Hypothyroid   . Irritable bowel syndrome   . Lumbago   . Microscopic polyangiitis ([place])   . Other and unspecified hyperlipidemia   . Perforation of tympanic membrane, unspecified   . Unspecified essential hypertension    Past Surgical History:  Procedure Laterality Date  . ABDOMINAL HYSTERECTOMY    . BIOPSY  01/02/2019   Procedure: BIOPSY;  Surgeon: Yetta Flock, MD;  Location: [facility] ENDOSCOPY;  Service: Gastroenterology;;  . cataract surgery  5/12 and 12/24/2012   both eyes  . COLONOSCOPY    . COLONOSCOPY WITH PROPOFOL N/A 01/02/2019   Procedure: COLONOSCOPY WITH PROPOFOL;  Surgeon: Yetta Flock, MD;  Location: [place];  Service: Gastroenterology;  Laterality: N/A;  . ESOPHAGOGASTRODUODENOSCOPY (EGD) WITH PROPOFOL N/A 01/02/2019   Procedure: ESOPHAGOGASTRODUODENOSCOPY (EGD) WITH PROPOFOL;  Surgeon: Yetta Flock, MD;  Location: [place];  Service: Gastroenterology;  Laterality: N/A;  . NSVD     x1  . POLYPECTOMY  01/02/2019   Procedure: POLYPECTOMY;  Surgeon: Yetta Flock, MD;  Location[facility] ENDOSCOPY;  Service: Gastroenterology;;  . POLYPECTOMY    . TONSILLECTOMY    . UPPER GASTROINTESTINAL ENDOSCOPY     Family History  Problem Relation Age of Onset  . Lung cancer Mother 72  . Lung disease Father 24  . COPD Other        sibling  . Colon cancer Neg Hx   . Colon polyps Neg Hx   . Esophageal  cancer Neg Hx   . Rectal cancer Neg Hx   . Stomach cancer Neg Hx    Social History   Socioeconomic History  . Marital status: Married    Spouse name: Jeneen Rinks  . Number of children: 2  . Years of education: 31  . Highest education level: High school graduate  Occupational History  . Occupation: retired Research scientist (physical sciences)  Tobacco Use  . Smoking status: Never Smoker  . Smokeless tobacco: Never Used  Substance and Sexual Activity  . Alcohol use: No  . Drug use: No  . Sexual activity: Not Currently  Other Topics Concern  . Not on file  Social History Narrative   Son is [name]--pt here    Married   2 children    retired   Investment banker, operational of Radio broadcast assistant Strain: [place]   . Difficulty of Paying Living Expenses: Not hard at all  Food Insecurity: No Food Insecurity  . Worried About Charity fundraiser in the Last Year: Never true  . Ran Out of Food in the Last Year: Never true  Transportation Needs: No Transportation Needs  . Lack of Transportation (Medical): No  . Lack of Transportation (Non-Medical): No  Physical Activity: Insufficiently Active  . Days of Exercise per Week: 7 days  . Minutes of Exercise per Session: 10 min  Stress: Stress Concern Present  . Feeling of Stress : To some extent  Social Connections: Somewhat Isolated  . Frequency of Communication with Friends and Family: More than three times a week  . Frequency of Social Gatherings with Friends and Family: Never  . Attends Religious Services: Never  . Active Member of Clubs or Organizations: No  . Attends Archivist Meetings: Never  . Marital Status: Married    Outpatient Encounter Medications as of 08/23/2019  Medication Sig  . amLODipine (NORVASC) 5 MG tablet Take 5 mg by mouth daily.  Marland Kitchen atorvastatin (LIPITOR) 20 MG tablet TAKE ONE TABLET BY MOUTH DAILY  . Cholecalciferol (VITAMIN D-3 PO) Take 1 capsule by mouth every Monday, Wednesday, and Friday.  . esomeprazole (NEXIUM)  40 MG packet Take 40 mg by mouth 2 (two) times daily before a meal.  . furosemide (LASIX) 40 MG tablet Take 0.5 tablets (20 mg total) by mouth 3 (three) times a week. Monday, Wed, Friday (Patient taking differently: Take 40 mg by mouth every Monday, Wednesday, and Friday. )  . hydrALAZINE (APRESOLINE) 50 MG tablet TAKE ONE TABLET BY MOUTH THREE TIMES A DAY  . levothyroxine (SYNTHROID) 50 MCG tablet TAKE ONE TABLET BY MOUTH EVERY MORNING BEFORE BREAKFAST  . metoprolol succinate (TOPROL-XL) 50 MG 24 hr tablet TAKE ONE TABLET BY MOUTH TWICE A DAY  . Multiple Vitamins-Minerals (CENTRUM SILVER 50+WOMEN) TABS Take 1 tablet by mouth daily with breakfast.  . predniSONE 5 MG TBEC Take 5 mg by mouth daily.   . calcitRIOL (  ROCALTROL) 0.25 MCG capsule Take 0.25 mcg by mouth every Monday, Wednesday, and Friday.   . ondansetron (ZOFRAN ODT) 4 MG disintegrating tablet Take 1 tablet (4 mg total) by mouth every 8 (eight) hours as needed for nausea or vomiting. (Patient not taking: Reported on 07/16/2019)  . sulfamethoxazole-trimethoprim (BACTRIM DS) 800-160 MG tablet Take 1 tablet by mouth 3 (three) times a week.    No facility-administered encounter medications on file as of 08/23/2019.    Activities of Daily Living In your present state of health, do you have any difficulty performing the following activities: 08/23/2019 12/31/2018  Hearing? Tempie Donning  Vision? N N  Difficulty concentrating or making decisions? Y N  Comment sometimes -  Walking or climbing stairs? Y N  Dressing or bathing? Y N  Comment patient helps with bathing -  Doing errands, shopping? Y N  Preparing Food and eating ? N -  Using the Toilet? N -  In the past six months, have you accidently leaked urine? Y -  Do you have problems with loss of bowel control? Y -  Managing your Medications? Y -  Managing your Finances? Y -  Housekeeping or managing your Housekeeping? N -  Some recent data might be hidden    Patient Care Team: Eulas Post, MD as PCP - General (Family Medicine) Stanford Breed Denice Bors, MD as Consulting Physician (Cardiology)    Assessment:   This is a routine wellness examination for [place].  Exercise Activities and Dietary recommendations Current Exercise Habits: Home exercise routine, Type of exercise: walking, Time (Minutes): 15, Frequency (Times/Week): 7, Weekly Exercise (Minutes/Week): 105, Intensity: Mild, Exercise limited by: Other - see comments(hematological disease causing weakness)  Goals    . Patient Stated     Wants to increase walking as she can. Goal is walking 150 minutes every week     . Patient Stated     Patient is starting to drive again        Fall Risk Fall Risk  08/23/2019 01/06/2019 04/08/2018 11/19/2017 08/08/2016  Falls in the past year? 0 0 No No Yes  Number falls in past yr: - - - - 1  Risk for fall due to : Medication side effect;Impaired mobility - - - -  Follow up Falls evaluation completed;Education provided;Falls prevention discussed - - - Education provided   Is the patient's home free of loose throw rugs in walkways, pet beds, electrical cords, etc?   yes      Grab bars in the bathroom? yes      Handrails on the stairs?   yes      Adequate lighting?   yes  Timed Get Up and Go performed: N/A due to telephone visit  Depression Screen PHQ 2/9 Scores 08/23/2019 01/06/2019 04/08/2018 11/19/2017  PHQ - 2 Score 0 0 0 0     Cognitive Function MMSE - Mini Mental State Exam 04/08/2018 08/08/2016  Not completed: Refused (No Data)  Orientation to time 5 -  Orientation to Place 5 -  Registration 3 -  Attention/ Calculation 2 -  Recall 3 -  Language- name 2 objects 2 -  Language- repeat 1 -  Language- follow 3 step command 3 -  Language- read & follow direction 1 -  Write a sentence 1 -  Copy design 0 -  Total score 26 -     6CIT Screen 08/23/2019  What Year? 0 points  What month? 0 points  What time? 0 points  Count  back from 20 2 points  Months in reverse 0  points  Repeat phrase 2 points  Total Score 4    Immunization History  Administered Date(s) Administered  . Fluad Quad(high Dose 65+) 04/14/2019  . Influenza Split 05/03/2011, 05/25/2012  . Influenza Whole 05/27/2006, 04/27/2008, 05/12/2009, 05/04/2010  . Influenza, High Dose Seasonal PF 06/08/2015, 04/30/2016, 05/05/2017, 04/08/2018  . Influenza,inj,Quad PF,6+ Mos 05/30/2014  . Influenza-Unspecified 04/28/2013  . Pneumococcal Conjugate-13 11/30/2013  . Pneumococcal Polysaccharide-23 04/28/2000, 08/08/2016  . Td 07/27/2009  . Zoster 10/28/2010  . Zoster Recombinat (Shingrix) 01/23/2018, 04/23/2018    Qualifies for Shingles Vaccine?patient has already completed  Screening Tests Health Maintenance  Topic Date Due  . TETANUS/TDAP  07/28/2019  . INFLUENZA VACCINE  Completed  . DEXA SCAN  Completed  . PNA vac Low Risk Adult  Completed    Cancer Screenings: Lung: Low Dose CT Chest recommended if Age 40-80 years, 30 pack-year currently smoking OR have quit w/in 15years. Patient does not qualify. Breast:  Up to date on Mammogram? Yes   Up to date of Bone Density/Dexa? Yes Colorectal: Yes  Additional Screenings:  Hepatitis C Screening: N/A due to age.      Plan:   Mrs. Bouchard would like to get back to walking 2 miles a day; she is currently walking 10 minutes every day with a cane. She also wants to drive independently again. She has started driving a little bit again. Recommended to update Tdap vaccine and she's aware that Medicare will not pay for this unless she has an injury. This can be provided to her at our office and she will be required to pay out of pocket.    I have personally reviewed and noted the following in the patient's chart:   . Medical and social history . Use of alcohol, tobacco or illicit drugs  . Current medications and supplements . Functional ability and status . Nutritional status . Physical activity . Advanced directives . List of other  physicians . Hospitalizations, surgeries, and ER visits in previous 12 months . Vitals . Screenings to include cognitive, depression, and falls . Referrals and appointments  In addition, I have reviewed and discussed with patient certain preventive protocols, quality metrics, and best practice recommendations. A written personalized care plan for preventive services as well as general preventive health recommendations were provided to patient.     Franne Forts, LPN  03/30/1114

## 2019-08-23 NOTE — Patient Instructions (Addendum)
Emily Cannon , Thank you for taking time to participate in your Medicare Wellness Visit. I appreciate your ongoing commitment to your health goals. Please review the following plan we discussed and let me know if I can assist you in the future.   Screening recommendations/referrals: Colorectal Screening: colonscopy completed 01/02/2019. Mammogram: completed 09/24/2018; due again 028/2021. Order has been sent to [facility]. Bone Density: completed 10/31/2010; up to date.   Vision and Dental Exams: Recommended annual ophthalmology exams for early detection of glaucoma and other disorders of the eye Recommended annual dental exams for proper oral hygiene  Diabetic Exams: Diabetic Eye Exam: N/A Diabetic Foot Exam: N/A  Vaccinations: Influenza vaccine: completed 04/14/2019; repeat Fall of 2021. Pneumococcal vaccine: completed 11/30/2013 & 08/08/2016; up to date.  Tdap vaccine: completed 07/27/2009; past due since 07/28/2019. Medicare does not cover UNLESS you have an accident or are injured. This can be given at our office with a nurse appointment.  Shingles vaccine: completed 01/23/2018 & 04/23/2018.  Advanced directives: Advance directives discussed with you today.  You have declined to receive documents for completion.  Goals: Recommend to drink at least 6-8 8oz glasses of water per day.  Recommend to increase exercise to at least 150 minutes per week.  Recommend to remove any items from the home that may cause slips or trips.   Next appointment: Please schedule your Annual Wellness Visit with your Nurse Health Advisor in one year.  Preventive Care 84 Years and Older, Female Preventive care refers to lifestyle choices and visits with your health care provider that can promote health and wellness. What does preventive care include?  A yearly physical exam. This is also called an annual well check.  Dental exams once or twice a year.  Routine eye exams. Ask your health  care provider how often you should have your eyes checked.  Personal lifestyle choices, including:  Daily care of your teeth and gums.  Regular physical activity.  Eating a healthy diet.  Avoiding tobacco and drug use.  Limiting alcohol use.  Practicing safe sex.  Taking low-dose aspirin every day if recommended by your health care provider.  Taking vitamin and mineral supplements as recommended by your health care provider. What happens during an annual well check? The services and screenings done by your health care provider during your annual well check will depend on your age, overall health, lifestyle risk factors, and family history of disease. Counseling  Your health care provider may ask you questions about your:  Alcohol use.  Tobacco use.  Drug use.  Emotional well-being.  Home and relationship well-being.  Sexual activity.  Eating habits.  History of falls.  Memory and ability to understand (cognition).  Work and work Statistician.  Reproductive health. Screening  You may have the following tests or measurements:  Height, weight, and BMI.  Blood pressure.  Lipid and cholesterol levels. These may be checked every 5 years, or more frequently if you are over 84 years old.  Skin check.  Lung cancer screening. You may have this screening every year starting at age 84 if you have a 30-pack-year history of smoking and currently smoke or have quit within the past 15 years.  Fecal occult blood test (FOBT) of the stool. You may have this test every year starting at age 84.  Flexible sigmoidoscopy or colonoscopy. You may have a sigmoidoscopy every 5 years or a colonoscopy every 10 years starting at age 35.  Hepatitis C blood test.  Hepatitis B blood  test.  Sexually transmitted disease (STD) testing.  Diabetes screening. This is done by checking your blood sugar (glucose) after you have not eaten for a while (fasting). You may have this done every 1-3  years.  Bone density scan. This is done to screen for osteoporosis. You may have this done starting at age 107.  Mammogram. This may be done every 1-2 years. Talk to your health care provider about how often you should have regular mammograms. Talk with your health care provider about your test results, treatment options, and if necessary, the need for more tests. Vaccines  Your health care provider may recommend certain vaccines, such as:  Influenza vaccine. This is recommended every year.  Tetanus, diphtheria, and acellular pertussis (Tdap, Td) vaccine. You may need a Td booster every 10 years.  Zoster vaccine. You may need this after age 65.  Pneumococcal 13-valent conjugate (PCV13) vaccine. One dose is recommended after age 20.  Pneumococcal polysaccharide (PPSV23) vaccine. One dose is recommended after age 108. Talk to your health care provider about which screenings and vaccines you need and how often you need them. This information is not intended to replace advice given to you by your health care provider. Make sure you discuss any questions you have with your health care provider. Document Released: 08/11/2015 Document Revised: 04/03/2016 Document Reviewed: 05/16/2015 Elsevier Interactive Patient Education  2017 [place] Prevention in the Home Falls can cause injuries. They can happen to people of all ages. There are many things you can do to make your home safe and to help prevent falls. What can I do on the outside of my home?  Regularly fix the edges of walkways and driveways and fix any cracks.  Remove anything that might make you trip as you walk through a door, such as a raised step or threshold.  Trim any bushes or trees on the path to your home.  Use bright outdoor lighting.  Clear any walking paths of anything that might make someone trip, such as rocks or tools.  Regularly check to see if handrails are loose or broken. Make sure that both sides of any  steps have handrails.  Any raised decks and porches should have guardrails on the edges.  Have any leaves, snow, or ice cleared regularly.  Use sand or salt on walking paths during winter.  Clean up any spills in your garage right away. This includes oil or grease spills. What can I do in the bathroom?  Use night lights.  Install grab bars by the toilet and in the tub and shower. Do not use towel bars as grab bars.  Use non-skid mats or decals in the tub or shower.  If you need to sit down in the shower, use a plastic, non-slip stool.  Keep the floor dry. Clean up any water that spills on the floor as soon as it happens.  Remove soap buildup in the tub or shower regularly.  Attach bath mats securely with double-sided non-slip rug tape.  Do not have throw rugs and other things on the floor that can make you trip. What can I do in the bedroom?  Use night lights.  Make sure that you have a light by your bed that is easy to reach.  Do not use any sheets or blankets that are too big for your bed. They should not hang down onto the floor.  Have a firm chair that has side arms. You can use this for support while  you get dressed.  Do not have throw rugs and other things on the floor that can make you trip. What can I do in the kitchen?  Clean up any spills right away.  Avoid walking on wet floors.  Keep items that you use a lot in easy-to-reach places.  If you need to reach something above you, use a strong step stool that has a grab bar.  Keep electrical cords out of the way.  Do not use floor polish or wax that makes floors slippery. If you must use wax, use non-skid floor wax.  Do not have throw rugs and other things on the floor that can make you trip. What can I do with my stairs?  Do not leave any items on the stairs.  Make sure that there are handrails on both sides of the stairs and use them. Fix handrails that are broken or loose. Make sure that handrails are  as long as the stairways.  Check any carpeting to make sure that it is firmly attached to the stairs. Fix any carpet that is loose or worn.  Avoid having throw rugs at the top or bottom of the stairs. If you do have throw rugs, attach them to the floor with carpet tape.  Make sure that you have a light switch at the top of the stairs and the bottom of the stairs. If you do not have them, ask someone to add them for you. What else can I do to help prevent falls?  Wear shoes that:  Do not have high heels.  Have rubber bottoms.  Are comfortable and fit you well.  Are closed at the toe. Do not wear sandals.  If you use a stepladder:  Make sure that it is fully opened. Do not climb a closed stepladder.  Make sure that both sides of the stepladder are locked into place.  Ask someone to hold it for you, if possible.  Clearly mark and make sure that you can see:  Any grab bars or handrails.  First and last steps.  Where the edge of each step is.  Use tools that help you move around (mobility aids) if they are needed. These include:  Canes.  Walkers.  Scooters.  Crutches.  Turn on the lights when you go into a dark area. Replace any light bulbs as soon as they burn out.  Set up your furniture so you have a clear path. Avoid moving your furniture around.  If any of your floors are uneven, fix them.  If there are any pets around you, be aware of where they are.  Review your medicines with your doctor. Some medicines can make you feel dizzy. This can increase your chance of falling. Ask your doctor what other things that you can do to help prevent falls. This information is not intended to replace advice given to you by your health care provider. Make sure you discuss any questions you have with your health care provider. Document Released: 05/11/2009 Document Revised: 12/21/2015 Document Reviewed: 08/19/2014 Elsevier Interactive Patient Education  2017 [employer].

## 2019-08-24 ENCOUNTER — Other Ambulatory Visit: Payer: Self-pay | Admitting: Family Medicine

## 2019-08-25 NOTE — Telephone Encounter (Signed)
Is patient taking this dose? Or the 5 mg of this medication? The 10 mg dose is not on med list and the 5 mg is on the med list. Please advise.

## 2019-08-25 NOTE — Telephone Encounter (Signed)
We need to stick with what is on her medication list.  Not sure why they are requesting 10 mg.  Some of her blood pressure medications were adjusted when she had some weight loss with her microscopic polyangiitis and that resulted in her blood pressure coming down some

## 2019-08-30 ENCOUNTER — Other Ambulatory Visit: Payer: Self-pay | Admitting: Family Medicine

## 2019-09-01 ENCOUNTER — Other Ambulatory Visit: Payer: Self-pay | Admitting: Family Medicine

## 2019-09-02 ENCOUNTER — Other Ambulatory Visit: Payer: Self-pay | Admitting: Family Medicine

## 2019-09-02 NOTE — Telephone Encounter (Signed)
°  amLODipine (NORVASC) 10 MG tablet   Took the Rx to [facility] on [address] on 08/24/2019 and they haven't received a response.

## 2019-09-03 NOTE — Telephone Encounter (Signed)
Spoke with pharmacist and refill has been pick up.

## 2019-09-16 ENCOUNTER — Other Ambulatory Visit: Payer: Self-pay | Admitting: Gastroenterology

## 2019-10-26 ENCOUNTER — Other Ambulatory Visit: Payer: Self-pay | Admitting: Family Medicine

## 2019-11-19 ENCOUNTER — Other Ambulatory Visit: Payer: Self-pay | Admitting: Family Medicine

## 2019-11-21 ENCOUNTER — Other Ambulatory Visit: Payer: Self-pay | Admitting: Family Medicine

## 2019-12-16 ENCOUNTER — Other Ambulatory Visit: Payer: Self-pay | Admitting: Gastroenterology

## 2019-12-29 ENCOUNTER — Other Ambulatory Visit: Payer: Self-pay | Admitting: Family Medicine

## 2020-01-03 ENCOUNTER — Telehealth: Payer: Self-pay | Admitting: Family Medicine

## 2020-01-03 NOTE — Telephone Encounter (Signed)
Please advise 

## 2020-01-03 NOTE — Telephone Encounter (Signed)
Patient had an in home visit from [facility].  They recommended pt to have Physical Therapy for her walking and hip pain.  This was discussed with pcp previously.  Patient is requesting this be set up.

## 2020-01-03 NOTE — Telephone Encounter (Signed)
OK to place order for outpatient PT.  If she is requesting Home PT will need virtual or in office follow up first for Medicare to cover.

## 2020-01-04 NOTE — Telephone Encounter (Signed)
Called PT she states she wants in home Phyiscal therapy . Pt is scheduled for a phone visit tomorrow

## 2020-01-05 ENCOUNTER — Telehealth (INDEPENDENT_AMBULATORY_CARE_PROVIDER_SITE_OTHER): Payer: Medicare Other | Admitting: Family Medicine

## 2020-01-05 ENCOUNTER — Other Ambulatory Visit: Payer: Self-pay

## 2020-01-05 DIAGNOSIS — R531 Weakness: Secondary | ICD-10-CM | POA: Diagnosis not present

## 2020-01-05 DIAGNOSIS — R269 Unspecified abnormalities of gait and mobility: Secondary | ICD-10-CM

## 2020-01-05 NOTE — Progress Notes (Signed)
Patient ID: Emily Cannon, female   DOB: [birth year], 86 y.o.   MRN: [id number]  This visit type was conducted due to national recommendations for restrictions regarding the COVID-19 pandemic in an effort to limit this patient's exposure and mitigate transmission in our community.   Virtual Visit via Telephone Note  I connected with Emily Cannon on 01/05/20 at  5:15 PM EDT by telephone and verified that I am speaking with the correct person using two identifiers.   I discussed the limitations, risks, security and privacy concerns of performing an evaluation and management service by telephone and the availability of in person appointments. I also discussed with the patient that there may be a patient responsible charge related to this service. The patient expressed understanding and agreed to proceed.  Location patient: home Location provider: work or home office Participants present for the call: patient, provider Patient did not have a visit in the prior 7 days to address this/these issue(s).   History of Present Illness: Emily Cannon had called with concerns for getting possibly some home physical therapy set up.  She states she had home visit with [employer] recently.  They had suggested consult for physical therapy.  She prefers home therapy because of difficulties with gait and balance issues and she is concerned about getting out.  She relates that she has had some left hip pain and low back pain but is having to "swing out "her left leg to walk.  Sounds like there may be some left foot drop.  She denies any urine or stool incontinence.  Also complaining of some generalized weakness and fatigue.  She has had history of microscopic polyangiitis and renal issues related to that.  She is still followed by rheumatology and nephrology.  She also has history of hypertension and hypothyroidism.  No recent lab work  Past Medical History:  Diagnosis Date  . Allergic rhinitis, cause  unspecified   . Allergy   . Anxiety state, unspecified   . Blood transfusion without reported diagnosis    2020  . Cataract    removed both eyes   . Chronic kidney disease   . Disorder of bone and cartilage, unspecified   . Esophageal candidiasis ([place])   . Gastric ulcer   . History of CMV   . History of Helicobacter pylori infection   . Hypothyroid   . Irritable bowel syndrome   . Lumbago   . Microscopic polyangiitis ([place])   . Other and unspecified hyperlipidemia   . Perforation of tympanic membrane, unspecified   . Unspecified essential hypertension    Past Surgical History:  Procedure Laterality Date  . ABDOMINAL HYSTERECTOMY    . BIOPSY  01/02/2019   Procedure: BIOPSY;  Surgeon: Yetta Flock, MD;  Location: [facility] ENDOSCOPY;  Service: Gastroenterology;;  . cataract surgery  5/12 and 12/24/2012   both eyes  . COLONOSCOPY    . COLONOSCOPY WITH PROPOFOL N/A 01/02/2019   Procedure: COLONOSCOPY WITH PROPOFOL;  Surgeon: Yetta Flock, MD;  Location: [place];  Service: Gastroenterology;  Laterality: N/A;  . ESOPHAGOGASTRODUODENOSCOPY (EGD) WITH PROPOFOL N/A 01/02/2019   Procedure: ESOPHAGOGASTRODUODENOSCOPY (EGD) WITH PROPOFOL;  Surgeon: Yetta Flock, MD;  Location: [place];  Service: Gastroenterology;  Laterality: N/A;  . NSVD     x1  . POLYPECTOMY  01/02/2019   Procedure: POLYPECTOMY;  Surgeon: Yetta Flock, MD;  Location: [facility] ENDOSCOPY;  Service: Gastroenterology;;  . POLYPECTOMY    . TONSILLECTOMY    . UPPER  GASTROINTESTINAL ENDOSCOPY      reports that she has never smoked. She has never used smokeless tobacco. She reports that she does not drink alcohol or use drugs. family history includes COPD in an other family member; Lung cancer (age of onset: 31) in her mother; Lung disease (age of onset: 98) in her father. Allergies  Allergen Reactions  . Alendronate Sodium Shortness Of Breath  . Amoxicillin Diarrhea    Severe diarrhea  . Codeine  Nausea Only  . Valsartan Swelling    Gums/throat swell      Observations/Objective: Patient sounds cheerful and well on the phone. I do not appreciate any SOB. Speech and thought processing are grossly intact. Patient reported vitals:  Assessment and Plan:  #1 progressive gait difficulties.  By description, question of left foot drop.  Obviously, cannot confirm without examining.  -We agreed to setting up home physical therapy to start with.  May need office follow-up to further assess.  #2 generalized fatigue.  She has complicated medical history as above and has had history of microscopic polyangiitis.  We recommended an office follow-up to further assess.  She also needs things like follow-up TSH with her hypothyroidism history  Follow Up Instructions:  -As above   [id number] [id number] [id number] 11-20 [id number] 21-30 I did not refer this patient for an OV in the next 24 hours for this/these issue(s).  I discussed the assessment and treatment plan with the patient. The patient was provided an opportunity to ask questions and all were answered. The patient agreed with the plan and demonstrated an understanding of the instructions.   The patient was advised to call back or seek an in-person evaluation if the symptoms worsen or if the condition fails to improve as anticipated.  I provided 25 minutes of non-face-to-face time during this encounter.   Carolann Littler, MD

## 2020-01-06 ENCOUNTER — Telehealth: Payer: Self-pay | Admitting: Family Medicine

## 2020-01-06 NOTE — Telephone Encounter (Signed)
Pt is calling in stating that she need to schedule her labs.  Can we have lab orders so pt can be scheduled?  Pt would like to have a call when orders has been placed.

## 2020-01-06 NOTE — Telephone Encounter (Signed)
#  2 generalized fatigue.  She has complicated medical history as above and has had history of microscopic polyangiitis.  We recommended an office follow-up to further assess.  She also needs things like follow-up TSH with her hypothyroidism history   Pt needs an office visit with Dr. Elease Hashimoto to be evaluated.  He will decide what labs to order when he sees her.  Please schedule.  Thanks

## 2020-01-17 ENCOUNTER — Ambulatory Visit (INDEPENDENT_AMBULATORY_CARE_PROVIDER_SITE_OTHER): Payer: Medicare Other | Admitting: Family Medicine

## 2020-01-17 ENCOUNTER — Encounter: Payer: Self-pay | Admitting: Family Medicine

## 2020-01-17 ENCOUNTER — Other Ambulatory Visit: Payer: Self-pay

## 2020-01-17 VITALS — BP 122/58 | HR 62 | Temp 98.1°F | Wt 129.1 lb

## 2020-01-17 DIAGNOSIS — M317 Microscopic polyangiitis: Secondary | ICD-10-CM | POA: Diagnosis not present

## 2020-01-17 DIAGNOSIS — I10 Essential (primary) hypertension: Secondary | ICD-10-CM | POA: Diagnosis not present

## 2020-01-17 DIAGNOSIS — E039 Hypothyroidism, unspecified: Secondary | ICD-10-CM

## 2020-01-17 DIAGNOSIS — E7800 Pure hypercholesterolemia, unspecified: Secondary | ICD-10-CM

## 2020-01-17 NOTE — Progress Notes (Signed)
Established Patient Office Visit  Subjective:  Patient ID: Emily Cannon, female    DOB: 1933/05/04  Age: 86 y.o. MRN: [id number]  CC:  Chief Complaint  Patient presents with  . Follow-up    Pt is here to follow up on fatigue     HPI Emily Cannon presents for medical follow-up and due for some labs.  She has history of microscopic polyangiitis that was diagnosed last year right in the middle of the early part of the pandemic.  She had kidney involvement and has been followed by nephrology and rheumatology.  She has been very stable recently.  She has scheduled follow-up with both rheumatology and nephrology soon.  She has hypothyroidism and is due for follow-up labs.  She takes levothyroxine.  Compliant with therapy.  She has hypertension treated with combination therapy and blood pressures been very stable.  No recent dizziness.  She is on atorvastatin for hyperlipidemia.  She is now off prednisone.  Major complaints have been increased fatigue and also some dyspnea with activity which may very well be deconditioning as she has been much less active during the past year.  We had placed order for home health referral for physical therapy but have not heard anything yet.  That order has been placed.  She denies any recent chest pain.  Past Medical History:  Diagnosis Date  . Allergic rhinitis, cause unspecified   . Allergy   . Anxiety state, unspecified   . Blood transfusion without reported diagnosis    2020  . Cataract    removed both eyes   . Chronic kidney disease   . Disorder of bone and cartilage, unspecified   . Esophageal candidiasis ([place])   . Gastric ulcer   . History of CMV   . History of Helicobacter pylori infection   . Hypothyroid   . Irritable bowel syndrome   . Lumbago   . Microscopic polyangiitis ([place])   . Other and unspecified hyperlipidemia   . Perforation of tympanic membrane, unspecified   . Unspecified essential hypertension     Past Surgical  History:  Procedure Laterality Date  . ABDOMINAL HYSTERECTOMY    . BIOPSY  01/02/2019   Procedure: BIOPSY;  Surgeon: Yetta Flock, MD;  Location: [facility] ENDOSCOPY;  Service: Gastroenterology;;  . cataract surgery  5/12 and 12/24/2012   both eyes  . COLONOSCOPY    . COLONOSCOPY WITH PROPOFOL N/A 01/02/2019   Procedure: COLONOSCOPY WITH PROPOFOL;  Surgeon: Yetta Flock, MD;  Location: [place];  Service: Gastroenterology;  Laterality: N/A;  . ESOPHAGOGASTRODUODENOSCOPY (EGD) WITH PROPOFOL N/A 01/02/2019   Procedure: ESOPHAGOGASTRODUODENOSCOPY (EGD) WITH PROPOFOL;  Surgeon: Yetta Flock, MD;  Location: [place];  Service: Gastroenterology;  Laterality: N/A;  . NSVD     x1  . POLYPECTOMY  01/02/2019   Procedure: POLYPECTOMY;  Surgeon: Yetta Flock, MD;  Location: [facility] ENDOSCOPY;  Service: Gastroenterology;;  . POLYPECTOMY    . TONSILLECTOMY    . UPPER GASTROINTESTINAL ENDOSCOPY      Family History  Problem Relation Age of Onset  . Lung cancer Mother 56  . Lung disease Father 51  . COPD Other        sibling  . Colon cancer Neg Hx   . Colon polyps Neg Hx   . Esophageal cancer Neg Hx   . Rectal cancer Neg Hx   . Stomach cancer Neg Hx     Social History   Socioeconomic History  . Marital  status: Married    Spouse name: Jeneen Rinks  . Number of children: 2  . Years of education: 59  . Highest education level: High school graduate  Occupational History  . Occupation: retired Research scientist (physical sciences)  Tobacco Use  . Smoking status: Never Smoker  . Smokeless tobacco: Never Used  Vaping Use  . Vaping Use: Never used  Substance and Sexual Activity  . Alcohol use: No  . Drug use: No  . Sexual activity: Not Currently  Other Topics Concern  . Not on file  Social History Narrative   Son is [name]--pt here    Married   2 children    retired   Investment banker, operational of Radio broadcast assistant Strain: [place]   . Difficulty of Paying Living Expenses: Not hard  at all  Food Insecurity: No Food Insecurity  . Worried About Charity fundraiser in the Last Year: Never true  . Ran Out of Food in the Last Year: Never true  Transportation Needs: No Transportation Needs  . Lack of Transportation (Medical): No  . Lack of Transportation (Non-Medical): No  Physical Activity: Insufficiently Active  . Days of Exercise per Week: 7 days  . Minutes of Exercise per Session: 10 min  Stress: Stress Concern Present  . Feeling of Stress : To some extent  Social Connections: Moderately Isolated  . Frequency of Communication with Friends and Family: More than three times a week  . Frequency of Social Gatherings with Friends and Family: Never  . Attends Religious Services: Never  . Active Member of Clubs or Organizations: No  . Attends Archivist Meetings: Never  . Marital Status: Married  Human resources officer Violence:   . Fear of Current or Ex-Partner:   . Emotionally Abused:   Marland Kitchen Physically Abused:   . Sexually Abused:     Outpatient Medications Prior to Visit  Medication Sig Dispense Refill  . amLODipine (NORVASC) 10 MG tablet TAKE ONE TABLET BY MOUTH DAILY 90 tablet 0  . atorvastatin (LIPITOR) 20 MG tablet TAKE ONE TABLET BY MOUTH DAILY 90 tablet 1  . calcitRIOL (ROCALTROL) 0.25 MCG capsule Take 0.25 mcg by mouth every Monday, Wednesday, and Friday.     . Cholecalciferol (VITAMIN D-3 PO) Take 1 capsule by mouth every Monday, Wednesday, and Friday.    . esomeprazole (NEXIUM) 40 MG packet TAKE ONE PACKET BY MOUTH ONCE TO TWICE A DAY AS NEEDED BEFORE MEALS TO MANAGE SYMPTOMS 60 each 1  . furosemide (LASIX) 40 MG tablet Take 0.5 tablets (20 mg total) by mouth 3 (three) times a week. Monday, Wed, Friday (Patient taking differently: Take 40 mg by mouth every Monday, Wednesday, and Friday. ) 30 tablet   . hydrALAZINE (APRESOLINE) 50 MG tablet TAKE ONE TABLET BY MOUTH THREE TIMES A DAY 270 tablet 0  . levothyroxine (SYNTHROID) 50 MCG tablet TAKE ONE TABLET BY  MOUTH EVERY MORNING BEFORE BREAKFAST 90 tablet 0  . metoprolol succinate (TOPROL-XL) 50 MG 24 hr tablet TAKE ONE TABLET BY MOUTH TWICE A DAY 180 tablet 0  . Multiple Vitamins-Minerals (CENTRUM SILVER 50+WOMEN) TABS Take 1 tablet by mouth daily with breakfast.    . ondansetron (ZOFRAN ODT) 4 MG disintegrating tablet Take 1 tablet (4 mg total) by mouth every 8 (eight) hours as needed for nausea or vomiting. 20 tablet 0  . amLODipine (NORVASC) 5 MG tablet Take 5 mg by mouth daily.    . predniSONE 5 MG TBEC Take 5 mg by mouth daily.  (  Patient not taking: Reported on 01/17/2020)    . sulfamethoxazole-trimethoprim (BACTRIM DS) 800-160 MG tablet Take 1 tablet by mouth 3 (three) times a week.  (Patient not taking: Reported on 01/17/2020)     No facility-administered medications prior to visit.    Allergies  Allergen Reactions  . Alendronate Sodium Shortness Of Breath  . Amoxicillin Diarrhea    Severe diarrhea  . Codeine Nausea Only  . Valsartan Swelling    Gums/throat swell    ROS Review of Systems  Constitutional: Positive for fatigue. Negative for chills and fever.  Respiratory: Positive for shortness of breath. Negative for cough and wheezing.   Cardiovascular: Negative for chest pain.  Gastrointestinal: Negative for abdominal pain.  Genitourinary: Negative for dysuria.  Neurological: Negative for headaches.      Objective:    Physical Exam Vitals reviewed.  Cardiovascular:     Rate and Rhythm: Normal rate and regular rhythm.  Pulmonary:     Effort: Pulmonary effort is normal.     Breath sounds: Normal breath sounds.  Musculoskeletal:     Right lower leg: No edema.     Left lower leg: No edema.  Neurological:     Mental Status: She is alert.     BP (!) 122/58 (BP Location: Left Arm, Patient Position: Sitting, Cuff Size: Normal)   Pulse 62   Temp 98.1 F (36.7 C) (Temporal)   Wt 129 lb 1.6 oz (58.6 kg)   SpO2 96%   BMI 25.21 kg/m  Wt Readings from Last 3 Encounters:    01/17/20 129 lb 1.6 oz (58.6 kg)  08/23/19 125 lb (56.7 kg)  07/16/19 125 lb (56.7 kg)     Health Maintenance Due  Topic Date Due  . COVID-19 Vaccine (1) Never done  . TETANUS/TDAP  07/28/2019    There are no preventive care reminders to display for this patient.  Lab Results  Component Value Date   TSH 1.66 02/26/2019   Lab Results  Component Value Date   WBC 12.4 (H) 04/14/2019   HGB 11.9 04/14/2019   HCT 35.9 04/14/2019   MCV 91.6 04/14/2019   PLT 195 04/14/2019   Lab Results  Component Value Date   NA 138 03/25/2019   K 4.1 03/25/2019   CO2 25 03/25/2019   GLUCOSE 118 (H) 03/25/2019   BUN 35 (H) 03/25/2019   CREATININE 1.98 (H) 03/25/2019   BILITOT 0.9 12/15/2018   ALKPHOS 41 12/15/2018   AST 32 12/15/2018   ALT 31 12/15/2018   PROT 6.0 (L) 12/15/2018   ALBUMIN 2.5 (L) 01/03/2019   CALCIUM 9.1 03/25/2019   ANIONGAP 7 01/03/2019   GFR 30.76 (L) 10/07/2018   Lab Results  Component Value Date   CHOL 136 09/11/2018   Lab Results  Component Value Date   HDL 46.00 09/11/2018   Lab Results  Component Value Date   LDLCALC 65 09/11/2018   Lab Results  Component Value Date   TRIG 124.0 09/11/2018   Lab Results  Component Value Date   CHOLHDL 3 09/11/2018   Lab Results  Component Value Date   HGBA1C 6.4 01/23/2009      Assessment & Plan:   Problem List Items Addressed This Visit      Unprioritized   Microscopic polyangiitis ([place])   Relevant Orders   CBC with Differential/Platelet   Hypothyroidism   Relevant Orders   TSH   Essential hypertension - Primary   Relevant Orders   Basic metabolic panel   Hyperlipidemia  Relevant Orders   Lipid panel   Hepatic function panel    We have replaced home health order for physical therapy and will try to check to see what the hold-up is for getting someone out.  It is a hardship for her to get out right now because of her increased risk of falls and transfers  We will plan routine follow-up in  6 months and sooner as needed  No orders of the defined types were placed in this encounter.   Follow-up: Return in about 6 months (around 07/18/2020).    Carolann Littler, MD

## 2020-01-18 LAB — BASIC METABOLIC PANEL
BUN: 23 mg/dL (ref 6–23)
CO2: 26 mEq/L (ref 19–32)
Calcium: 9.3 mg/dL (ref 8.4–10.5)
Chloride: 104 mEq/L (ref 96–112)
Creatinine, Ser: 1.51 mg/dL — ABNORMAL HIGH (ref 0.40–1.20)
GFR: 32.78 mL/min — ABNORMAL LOW (ref >60.00)
Glucose, Bld: 153 mg/dL — ABNORMAL HIGH (ref 70–99)
Potassium: 4.1 mEq/L (ref 3.5–5.1)
Sodium: 140 mEq/L (ref 135–145)

## 2020-01-18 LAB — CBC WITH DIFFERENTIAL/PLATELET
Basophils Absolute: 0.1 10*3/uL (ref 0.0–0.1)
Basophils Relative: 1 % (ref 0.0–3.0)
Eosinophils Absolute: 0.4 10*3/uL (ref 0.0–0.7)
Eosinophils Relative: 3.5 % (ref 0.0–5.0)
HCT: 34.1 % — ABNORMAL LOW (ref 36.0–46.0)
Hemoglobin: 11.7 g/dL — ABNORMAL LOW (ref 12.0–15.0)
Lymphocytes Relative: 26.9 % (ref 12.0–46.0)
Lymphs Abs: 2.7 10*3/uL (ref 0.7–4.0)
MCHC: 34.1 g/dL (ref 30.0–36.0)
MCV: 91.9 fl (ref 78.0–100.0)
Monocytes Absolute: 1.3 10*3/uL — ABNORMAL HIGH (ref 0.1–1.0)
Monocytes Relative: 13.2 % — ABNORMAL HIGH (ref 3.0–12.0)
Neutro Abs: 5.6 10*3/uL (ref 1.4–7.7)
Neutrophils Relative %: 55.4 % (ref 43.0–77.0)
Platelets: 304 10*3/uL (ref 150.0–400.0)
RBC: 3.71 Mil/uL — ABNORMAL LOW (ref 3.87–5.11)
RDW: 13.7 % (ref 11.5–15.5)
WBC: 10.1 10*3/uL (ref 4.0–10.5)

## 2020-01-18 LAB — LIPID PANEL
Cholesterol: 148 mg/dL (ref 0–200)
HDL: 31.3 mg/dL — ABNORMAL LOW (ref >39.00)
Total CHOL/HDL Ratio: 5
Triglycerides: 533 mg/dL — ABNORMAL HIGH (ref 0.0–149.0)

## 2020-01-18 LAB — TSH: TSH: 2.35 u[IU]/mL (ref 0.35–4.50)

## 2020-01-18 LAB — HEPATIC FUNCTION PANEL
ALT: 15 U/L (ref 0–35)
AST: 18 U/L (ref 0–37)
Albumin: 4.3 g/dL (ref 3.5–5.2)
Alkaline Phosphatase: 101 U/L (ref 39–117)
Bilirubin, Direct: 0.1 mg/dL (ref 0.0–0.3)
Total Bilirubin: 0.4 mg/dL (ref 0.2–1.2)
Total Protein: 6 g/dL (ref 6.0–8.3)

## 2020-01-18 LAB — LDL CHOLESTEROL, DIRECT: Direct LDL: 49 mg/dL

## 2020-01-24 ENCOUNTER — Telehealth: Payer: Self-pay | Admitting: Family Medicine

## 2020-01-24 DIAGNOSIS — R531 Weakness: Secondary | ICD-10-CM

## 2020-01-24 DIAGNOSIS — R269 Unspecified abnormalities of gait and mobility: Secondary | ICD-10-CM

## 2020-01-24 NOTE — Addendum Note (Signed)
Addended by: Modena Morrow R on: 01/24/2020 01:56 PM   Modules accepted: Orders

## 2020-01-24 NOTE — Telephone Encounter (Signed)
Please advise if okay?

## 2020-01-24 NOTE — Telephone Encounter (Signed)
Referral placed pt husband notified of referral being placed

## 2020-01-24 NOTE — Telephone Encounter (Signed)
The referral we placed was for home health physical therapy.  We have had tremendous difficulties getting companies out apparently for lack of help.  Okay to set up referral to [facility] our concern was more for generalized weakness and I think she really needs some physical therapy

## 2020-01-24 NOTE — Telephone Encounter (Signed)
Pt's husband, [name], would like a referral sent to [facility] per pt's husband, the other office that pt was referred to has not called them. Thanks

## 2020-01-25 ENCOUNTER — Telehealth: Payer: Self-pay | Admitting: *Deleted

## 2020-01-25 MED ORDER — TRAMADOL HCL 50 MG PO TABS: 50.0000 mg | ORAL_TABLET | Freq: Four times a day (QID) | ORAL | 0 refills | Status: DC | PRN

## 2020-01-25 NOTE — Telephone Encounter (Signed)
Pt son who is dpr says she has been having lower back pain but radiates down to leg  and into knee pt son believes its a sciatica nerve and would like something sent in to help please advise pt son states pt is in tears

## 2020-01-25 NOTE — Telephone Encounter (Signed)
Pt son informed that medication was sent to pharmacy

## 2020-01-25 NOTE — Telephone Encounter (Signed)
I sent in some tramadol to take 1 every 6 hours as needed.  She does have reported history of intolerance with codeine

## 2020-01-25 NOTE — Telephone Encounter (Signed)
Please advise 

## 2020-01-25 NOTE — Telephone Encounter (Signed)
Please get more specifics about what type of pain she is dealing with- Back, or specific joints?

## 2020-01-25 NOTE — Telephone Encounter (Signed)
Caller stated that patient is in a lot of pain and is waiting on appointment with ortho. Would like to know if Dr. Elease Hashimoto will send something into the pharmacy for pain. Please advise.

## 2020-01-25 NOTE — Addendum Note (Signed)
Addended by: Eulas Post on: 01/25/2020 01:51 PM   Modules accepted: Orders

## 2020-01-30 ENCOUNTER — Other Ambulatory Visit: Payer: Self-pay | Admitting: Family Medicine

## 2020-02-01 ENCOUNTER — Other Ambulatory Visit: Payer: Self-pay

## 2020-02-01 MED ORDER — ESOMEPRAZOLE MAGNESIUM 40 MG PO PACK: PACK | ORAL | 1 refills | Status: DC

## 2020-02-02 ENCOUNTER — Other Ambulatory Visit: Payer: Self-pay | Admitting: Family Medicine

## 2020-02-02 NOTE — Telephone Encounter (Signed)
Please advise 

## 2020-02-03 ENCOUNTER — Other Ambulatory Visit: Payer: Self-pay | Admitting: Family Medicine

## 2020-02-06 ENCOUNTER — Other Ambulatory Visit: Payer: Self-pay | Admitting: Family Medicine

## 2020-02-07 ENCOUNTER — Other Ambulatory Visit: Payer: Self-pay

## 2020-02-07 MED ORDER — ESOMEPRAZOLE MAGNESIUM 40 MG PO PACK: PACK | ORAL | 1 refills | Status: DC

## 2020-02-08 ENCOUNTER — Other Ambulatory Visit: Payer: Self-pay | Admitting: Family Medicine

## 2020-02-09 ENCOUNTER — Other Ambulatory Visit: Payer: Self-pay | Admitting: Family Medicine

## 2020-02-09 ENCOUNTER — Telehealth: Payer: Self-pay | Admitting: Family Medicine

## 2020-02-09 MED ORDER — ATORVASTATIN CALCIUM 20 MG PO TABS: 20.0000 mg | ORAL_TABLET | Freq: Every day | ORAL | 0 refills | Status: DC

## 2020-02-09 NOTE — Telephone Encounter (Signed)
Courtney from [facility] call stated pt lost her prescription and need a new prescription sent.

## 2020-02-09 NOTE — Telephone Encounter (Signed)
Please advise if okay to send.

## 2020-02-09 NOTE — Telephone Encounter (Signed)
Refill sent in

## 2020-02-09 NOTE — Telephone Encounter (Signed)
Is she saying she lost the Tramadol tablets??   The prescription was sent electronically.

## 2020-02-09 NOTE — Telephone Encounter (Signed)
Please advise if okay to send in to replace

## 2020-02-09 NOTE — Telephone Encounter (Signed)
OK to refill

## 2020-02-09 NOTE — Telephone Encounter (Signed)
Pt states she lost her bottle of atorvastatin

## 2020-02-10 ENCOUNTER — Other Ambulatory Visit: Payer: Self-pay | Admitting: Family Medicine

## 2020-02-11 NOTE — Telephone Encounter (Signed)
This was just sent in on the 7th of this month.  Are they aware?

## 2020-02-11 NOTE — Telephone Encounter (Signed)
Please advise 

## 2020-02-11 NOTE — Telephone Encounter (Signed)
Yes pt has gone through since only 5 day supply

## 2020-02-15 ENCOUNTER — Other Ambulatory Visit: Payer: Self-pay | Admitting: Family Medicine

## 2020-02-22 ENCOUNTER — Other Ambulatory Visit: Payer: Self-pay

## 2020-02-23 ENCOUNTER — Encounter: Payer: Self-pay | Admitting: Family Medicine

## 2020-02-23 ENCOUNTER — Ambulatory Visit: Payer: Medicare Other | Admitting: Family Medicine

## 2020-02-23 VITALS — BP 120/58 | HR 77 | Temp 98.2°F | Wt 120.7 lb

## 2020-02-23 DIAGNOSIS — M21372 Foot drop, left foot: Secondary | ICD-10-CM | POA: Diagnosis not present

## 2020-02-23 DIAGNOSIS — R300 Dysuria: Secondary | ICD-10-CM | POA: Diagnosis not present

## 2020-02-23 DIAGNOSIS — S80211A Abrasion, right knee, initial encounter: Secondary | ICD-10-CM

## 2020-02-23 LAB — POCT URINALYSIS DIPSTICK
Bilirubin, UA: NEGATIVE
Blood, UA: NEGATIVE
Glucose, UA: NEGATIVE
Ketones, UA: NEGATIVE
Nitrite, UA: NEGATIVE
Protein, UA: POSITIVE — AB
Spec Grav, UA: 1.015 (ref 1.010–1.025)
Urobilinogen, UA: 0.2 E.U./dL
pH, UA: 7.5 (ref 5.0–8.0)

## 2020-02-23 MED ORDER — GABAPENTIN 100 MG PO CAPS
100.0000 mg | ORAL_CAPSULE | Freq: Every day | ORAL | 3 refills | Status: DC
Start: 2020-02-23 — End: 2020-04-05

## 2020-02-23 MED ORDER — CEPHALEXIN 500 MG PO CAPS
500.0000 mg | ORAL_CAPSULE | Freq: Three times a day (TID) | ORAL | 0 refills | Status: DC
Start: 2020-02-23 — End: 2020-03-28

## 2020-02-23 NOTE — Patient Instructions (Signed)
Continue with the PT  You have a left foot drop and hopefully the strength will come back with time.  Start the Gabapentin 100 mg at night and may titrate up to two tablets at night if needed for pain.

## 2020-02-23 NOTE — Progress Notes (Signed)
Established Patient Office Visit  Subjective:  Patient ID: Emily Cannon, female    DOB: [birth year]  Age: 86 y.o. MRN: [id number]  CC:  Chief Complaint  Patient presents with  . Numbness    left leg numbness     HPI Emily Cannon presents for low back pain and some difficulty ambulating. She has had some progressive left lower extremity numbness. She has seen orthopedic back specialist and had epidural last week. She is ambulating with a walker. She has what sounds like a foot drop on the left side. She saw physical therapy the first time today. She had MRI at [facility] orthopedic back on 02/02/2020. This showed advanced degenerative disc disease L5-S1. She had low-grade foraminal stenosis and central stenosis noted L1-L4. Anterolisthesis L4-L5  No urine or stool incontinence. Her back pain has been more severe at night. She has taken some tramadol. She was prescribed gabapentin 300 mg nightly but felt like this was too strong. She states she felt very dizzy and "loopy "after taking this. She only took one dose.  Separate issue of burning with urination past few days. She has had UTIs in the past. No fevers or chills.  No gross hematuria.   Past Medical History:  Diagnosis Date  . Allergic rhinitis, cause unspecified   . Allergy   . Anxiety state, unspecified   . Blood transfusion without reported diagnosis    2020  . Cataract    removed both eyes   . Chronic kidney disease   . Disorder of bone and cartilage, unspecified   . Esophageal candidiasis ([place])   . Gastric ulcer   . History of CMV   . History of Helicobacter pylori infection   . Hypothyroid   . Irritable bowel syndrome   . Lumbago   . Microscopic polyangiitis ([place])   . Other and unspecified hyperlipidemia   . Perforation of tympanic membrane, unspecified   . Unspecified essential hypertension     Past Surgical History:  Procedure Laterality Date  . ABDOMINAL HYSTERECTOMY    . BIOPSY  01/02/2019    Procedure: BIOPSY;  Surgeon: Yetta Flock, MD;  Location: [facility] ENDOSCOPY;  Service: Gastroenterology;;  . cataract surgery  5/12 and 12/24/2012   both eyes  . COLONOSCOPY    . COLONOSCOPY WITH PROPOFOL N/A 01/02/2019   Procedure: COLONOSCOPY WITH PROPOFOL;  Surgeon: Yetta Flock, MD;  Location: [place];  Service: Gastroenterology;  Laterality: N/A;  . ESOPHAGOGASTRODUODENOSCOPY (EGD) WITH PROPOFOL N/A 01/02/2019   Procedure: ESOPHAGOGASTRODUODENOSCOPY (EGD) WITH PROPOFOL;  Surgeon: Yetta Flock, MD;  Location: [place];  Service: Gastroenterology;  Laterality: N/A;  . NSVD     x1  . POLYPECTOMY  01/02/2019   Procedure: POLYPECTOMY;  Surgeon: Yetta Flock, MD;  Location: [facility] ENDOSCOPY;  Service: Gastroenterology;;  . POLYPECTOMY    . TONSILLECTOMY    . UPPER GASTROINTESTINAL ENDOSCOPY      Family History  Problem Relation Age of Onset  . Lung cancer Mother 7  . Lung disease Father 27  . COPD Other        sibling  . Colon cancer Neg Hx   . Colon polyps Neg Hx   . Esophageal cancer Neg Hx   . Rectal cancer Neg Hx   . Stomach cancer Neg Hx     Social History   Socioeconomic History  . Marital status: Married    Spouse name: Emily Cannon  . Number of children: 2  . Years of education:  12  . Highest education level: High school graduate  Occupational History  . Occupation: retired Research scientist (physical sciences)  Tobacco Use  . Smoking status: Never Smoker  . Smokeless tobacco: Never Used  Vaping Use  . Vaping Use: Never used  Substance and Sexual Activity  . Alcohol use: No  . Drug use: No  . Sexual activity: Not Currently  Other Topics Concern  . Not on file  Social History Narrative   Son is Emily Cannon--pt here    Married   2 children    retired   Investment banker, operational of Radio broadcast assistant Strain: [place]   . Difficulty of Paying Living Expenses: Not hard at all  Food Insecurity: No Food Insecurity  . Worried About Charity fundraiser in the  Last Year: Never true  . Ran Out of Food in the Last Year: Never true  Transportation Needs: No Transportation Needs  . Lack of Transportation (Medical): No  . Lack of Transportation (Non-Medical): No  Physical Activity: Insufficiently Active  . Days of Exercise per Week: 7 days  . Minutes of Exercise per Session: 10 min  Stress: Stress Concern Present  . Feeling of Stress : To some extent  Social Connections: Moderately Isolated  . Frequency of Communication with Friends and Family: More than three times a week  . Frequency of Social Gatherings with Friends and Family: Never  . Attends Religious Services: Never  . Active Member of Clubs or Organizations: No  . Attends Archivist Meetings: Never  . Marital Status: Married  Human resources officer Violence:   . Fear of Current or Ex-Partner:   . Emotionally Abused:   Marland Kitchen Physically Abused:   . Sexually Abused:     Outpatient Medications Prior to Visit  Medication Sig Dispense Refill  . amLODipine (NORVASC) 10 MG tablet TAKE ONE TABLET BY MOUTH DAILY 90 tablet 0  . atorvastatin (LIPITOR) 20 MG tablet Take 1 tablet (20 mg total) by mouth daily. 90 tablet 0  . calcitRIOL (ROCALTROL) 0.25 MCG capsule Take 0.25 mcg by mouth every Monday, Wednesday, and Friday.     . Cholecalciferol (VITAMIN D-3 PO) Take 1 capsule by mouth every Monday, Wednesday, and Friday.    . esomeprazole (NEXIUM) 40 MG packet TAKE ONE PACKET BY MOUTH ONCE TO TWICE A DAY AS NEEDED BEFORE MEALS TO MANAGE SYMPTOMS 180 each 1  . furosemide (LASIX) 40 MG tablet Take 0.5 tablets (20 mg total) by mouth 3 (three) times a week. Monday, Wed, Friday (Patient taking differently: Take 40 mg by mouth every Monday, Wednesday, and Friday. ) 30 tablet   . hydrALAZINE (APRESOLINE) 50 MG tablet TAKE ONE TABLET BY MOUTH THREE TIMES A DAY 270 tablet 0  . levothyroxine (SYNTHROID) 50 MCG tablet TAKE ONE TABLET BY MOUTH EVERY MORNING BEFORE BREAKFAST 90 tablet 1  . metoprolol succinate  (TOPROL-XL) 50 MG 24 hr tablet TAKE ONE TABLET BY MOUTH TWICE A DAY 180 tablet 0  . Multiple Vitamins-Minerals (CENTRUM SILVER 50+WOMEN) TABS Take 1 tablet by mouth daily with breakfast.    . ondansetron (ZOFRAN ODT) 4 MG disintegrating tablet Take 1 tablet (4 mg total) by mouth every 8 (eight) hours as needed for nausea or vomiting. 20 tablet 0  . traMADol (ULTRAM) 50 MG tablet TAKE ONE TABLET BY MOUTH EVERY 6 HOURS AS NEEDED FOR UP TO 5 DAYS 30 tablet 0   No facility-administered medications prior to visit.    Allergies  Allergen Reactions  . Alendronate  Sodium Shortness Of Breath  . Amoxicillin Diarrhea    Severe diarrhea  . Codeine Nausea Only  . Valsartan Swelling    Gums/throat swell    ROS Review of Systems  Constitutional: Negative for chills and fever.  Respiratory: Negative for shortness of breath.   Cardiovascular: Negative for chest pain.  Genitourinary: Positive for dysuria. Negative for flank pain and hematuria.  Musculoskeletal: Positive for back pain.  Neurological: Positive for weakness and numbness.      Objective:    Physical Exam Vitals reviewed.  Cardiovascular:     Rate and Rhythm: Normal rate and regular rhythm.  Pulmonary:     Effort: Pulmonary effort is normal.     Breath sounds: Normal breath sounds.  Skin:    Comments: Large abrasion right knee over patella.  No signs of cellulitis  Neurological:     Comments: Diminished left ankle reflex c/w right.  Knee reflexes symmetric.  She has weakness with dorsiflexion on the left.      BP (!) 120/58 (BP Location: Left Arm, Patient Position: Sitting, Cuff Size: Normal)   Pulse 77   Temp 98.2 F (36.8 C) (Oral)   Wt 120 lb 11.2 oz (54.7 kg)   SpO2 94%   BMI 23.57 kg/m  Wt Readings from Last 3 Encounters:  02/23/20 120 lb 11.2 oz (54.7 kg)  01/17/20 129 lb 1.6 oz (58.6 kg)  08/23/19 125 lb (56.7 kg)     Health Maintenance Due  Topic Date Due  . TETANUS/TDAP  07/28/2019    There are no  preventive care reminders to display for this patient.  Lab Results  Component Value Date   TSH 2.35 01/17/2020   Lab Results  Component Value Date   WBC 10.1 01/17/2020   HGB 11.7 (L) 01/17/2020   HCT 34.1 (L) 01/17/2020   MCV 91.9 01/17/2020   PLT 304.0 01/17/2020   Lab Results  Component Value Date   NA 140 01/17/2020   K 4.1 01/17/2020   CO2 26 01/17/2020   GLUCOSE 153 (H) 01/17/2020   BUN 23 01/17/2020   CREATININE 1.51 (H) 01/17/2020   BILITOT 0.4 01/17/2020   ALKPHOS 101 01/17/2020   AST 18 01/17/2020   ALT 15 01/17/2020   PROT 6.0 01/17/2020   ALBUMIN 4.3 01/17/2020   CALCIUM 9.3 01/17/2020   ANIONGAP 7 01/03/2019   GFR 32.78 (L) 01/17/2020   Lab Results  Component Value Date   CHOL 148 01/17/2020   Lab Results  Component Value Date   HDL 31.30 (L) 01/17/2020   Lab Results  Component Value Date   LDLCALC 65 09/11/2018   Lab Results  Component Value Date   TRIG (H) 01/17/2020    533.0 Triglyceride is over 400; calculations on Lipids are invalid.   Lab Results  Component Value Date   CHOLHDL 5 01/17/2020   Lab Results  Component Value Date   HGBA1C 6.4 01/23/2009      Assessment & Plan:   #1  Left foot drop.  Suspect related to lumbar issues at L5-S1.  Also has decreased left ankle reflex.  Recent Epidural per ortho as above.  She is in PT  -continue close follow up with PT and ortho -consider lower dose Gabapentin for her back pain and will start with one qhs. -she may need AFO brace to assist with ambulation if strength not improving soon. -doubt related to common peroneal nerve compression.  #2 right knee abrasion from recent fall.  -keep clean with  soap and water and follow up for any signs of infection  #3 dysuria.  Dip suggests possible UTI  -urine cx sent -plenty of fluids -start Keflex 500 mg po tid for 7 days pending cx results.  Eulas Post MD [facility] ordered this encounter    Medications  . gabapentin (NEURONTIN) 100 MG capsule    Sig: Take 1 capsule (100 mg total) by mouth at bedtime.    Dispense:  30 capsule    Refill:  3    Follow-up: No follow-ups on file.    Carolann Littler, MD

## 2020-02-23 NOTE — Progress Notes (Signed)
le

## 2020-02-25 LAB — URINE CULTURE
MICRO NUMBER:: 10760041
SPECIMEN QUALITY:: ADEQUATE

## 2020-03-01 ENCOUNTER — Other Ambulatory Visit: Payer: Self-pay | Admitting: Family Medicine

## 2020-03-22 ENCOUNTER — Other Ambulatory Visit: Payer: Self-pay

## 2020-03-22 ENCOUNTER — Telehealth (INDEPENDENT_AMBULATORY_CARE_PROVIDER_SITE_OTHER): Payer: Medicare Other | Admitting: Family Medicine

## 2020-03-22 DIAGNOSIS — R300 Dysuria: Secondary | ICD-10-CM

## 2020-03-22 DIAGNOSIS — R21 Rash and other nonspecific skin eruption: Secondary | ICD-10-CM

## 2020-03-22 DIAGNOSIS — M21372 Foot drop, left foot: Secondary | ICD-10-CM

## 2020-03-22 DIAGNOSIS — R9389 Abnormal findings on diagnostic imaging of other specified body structures: Secondary | ICD-10-CM | POA: Diagnosis not present

## 2020-03-22 NOTE — Progress Notes (Signed)
Patient ID: Emily Cannon, female   DOB: 13-Jun-1933, 86 y.o.   MRN: [id number]   This visit type was conducted due to national recommendations for restrictions regarding the COVID-19 pandemic in an effort to limit this patient's exposure and mitigate transmission in our community.   Virtual Visit via Telephone Note  I connected with Emily Cannon on 03/22/20 at  5:15 PM EDT by telephone and verified that I am speaking with the correct person using two identifiers.   I discussed the limitations, risks, security and privacy concerns of performing an evaluation and management service by telephone and the availability of in person appointments. I also discussed with the patient that there may be a patient responsible charge related to this service. The patient expressed understanding and agreed to proceed.  Location patient: home Location provider: work or home office Participants present for the call: patient, provider Patient did not have a visit in the prior 7 days to address this/these issue(s).   History of Present Illness: Emily Cannon and her husband called to discuss several items.  She has had some low back pain and has been followed by orthopedic surgery.  She developed recent left foot drop and orthopedic surgery did not feel like there was any clear explanation from her MRI to explain that.  She has pending follow-up with neurology in October to consider EMG/nerve conduction studies.  Her foot drop is worse if anything.  She has been referred for AFO brace and they are waiting for insurance approval.  Emily Cannon had recent dysuria.  Urine culture grew out Proteus mirabilis.  She was treated with Keflex and did improve but now has some very mild intermittent burning.  No fever. No nausea or vomiting.  No flank pain.  She has become much more debilitated and husband would like to discuss possible wheelchair order.  She had recent MRI through [place] and this apparently showed some  cystic type lesions in the pancreas head and liver.  Patient's husband reportedly dropped off report yesterday but have not seen this yet.  There was suggestion for consideration of CT abdomen and pelvis with and without contrast with attention to the pancreatic head.  Patient denies any prior history of pancreatitis.  She has had some recent mild appetite and weight loss.  No epigastric abdominal pain.  No nausea or vomiting.  She has history of microscopic polyangiitis which has been relatively stable recently.  She does have recurrent rash on her lower extremity and husband would like to have that evaluated as well.  She has had history of recurrent shingles previously.  Current rash is nonpainful  Past Medical History:  Diagnosis Date  . Allergic rhinitis, cause unspecified   . Allergy   . Anxiety state, unspecified   . Blood transfusion without reported diagnosis    2020  . Cataract    removed both eyes   . Chronic kidney disease   . Disorder of bone and cartilage, unspecified   . Esophageal candidiasis ([place])   . Gastric ulcer   . History of CMV   . History of Helicobacter pylori infection   . Hypothyroid   . Irritable bowel syndrome   . Lumbago   . Microscopic polyangiitis ([place])   . Other and unspecified hyperlipidemia   . Perforation of tympanic membrane, unspecified   . Unspecified essential hypertension    Past Surgical History:  Procedure Laterality Date  . ABDOMINAL HYSTERECTOMY    . BIOPSY  01/02/2019   Procedure: BIOPSY;  Surgeon: Yetta Flock, MD;  Location: [facility] ENDOSCOPY;  Service: Gastroenterology;;  . cataract surgery  5/12 and 12/24/2012   both eyes  . COLONOSCOPY    . COLONOSCOPY WITH PROPOFOL N/A 01/02/2019   Procedure: COLONOSCOPY WITH PROPOFOL;  Surgeon: Yetta Flock, MD;  Location: [place];  Service: Gastroenterology;  Laterality: N/A;  . ESOPHAGOGASTRODUODENOSCOPY (EGD) WITH PROPOFOL N/A 01/02/2019   Procedure: ESOPHAGOGASTRODUODENOSCOPY  (EGD) WITH PROPOFOL;  Surgeon: Yetta Flock, MD;  Location: [place];  Service: Gastroenterology;  Laterality: N/A;  . NSVD     x1  . POLYPECTOMY  01/02/2019   Procedure: POLYPECTOMY;  Surgeon: Yetta Flock, MD;  Location: [facility] ENDOSCOPY;  Service: Gastroenterology;;  . POLYPECTOMY    . TONSILLECTOMY    . UPPER GASTROINTESTINAL ENDOSCOPY      reports that she has never smoked. She has never used smokeless tobacco. She reports that she does not drink alcohol and does not use drugs. family history includes COPD in an other family member; Lung cancer (age of onset: 79) in her mother; Lung disease (age of onset: 29) in her father. Allergies  Allergen Reactions  . Alendronate Sodium Shortness Of Breath  . Amoxicillin Diarrhea    Severe diarrhea  . Codeine Nausea Only  . Valsartan Swelling    Gums/throat swell      Observations/Objective: Patient sounds cheerful and well on the phone. I do not appreciate any SOB. Speech and thought processing are grossly intact. Patient reported vitals:  Assessment and Plan:  #1 recent left foot drop.  Apparently no clear explanation from her MRI lumbar spine.  She has pending follow-up with neurology for consideration of nerve conduction studies  -She has been referred for AFO brace and they are waiting for insurance approval  #2 recent abnormal MRI scan with concern for pancreatic abnormality noted. -Husband to bring MRI report with him the office on Friday and we will consider setting up additional imaging at that point  #3 recent UTI with Proteus mirabilis.  Patient improved after Keflex and now has some mild recurrent symptoms -Check urinalysis at follow-up visit on Friday  #4 reported skin rash lower extremity -Assess at office visit in 2 days  Follow Up Instructions:  -office follow up in 2 days.    [id number] [id number] [id number] [id number] [id number] 21-30 I did not refer this patient for an OV in the next 24 hours for this/these  issue(s).  I discussed the assessment and treatment plan with the patient. The patient was provided an opportunity to ask questions and all were answered. The patient agreed with the plan and demonstrated an understanding of the instructions.   The patient was advised to call back or seek an in-person evaluation if the symptoms worsen or if the condition fails to improve as anticipated.  I provided 30 minutes of non-face-to-face time during this encounter.   Carolann Littler, MD

## 2020-03-23 ENCOUNTER — Telehealth: Payer: Self-pay | Admitting: Neurology

## 2020-03-23 DIAGNOSIS — M21372 Foot drop, left foot: Secondary | ICD-10-CM | POA: Insufficient documentation

## 2020-03-23 NOTE — Telephone Encounter (Signed)
Dr. Melina Schools would like to know if patient can have a sooner appointment, can I see the consult please? She may be able to just have an emg/ncs, thanks

## 2020-03-23 NOTE — Telephone Encounter (Signed)
error 

## 2020-03-24 ENCOUNTER — Encounter: Payer: Self-pay | Admitting: Family Medicine

## 2020-03-24 ENCOUNTER — Other Ambulatory Visit: Payer: Self-pay

## 2020-03-24 ENCOUNTER — Ambulatory Visit: Payer: Medicare Other | Admitting: Family Medicine

## 2020-03-24 VITALS — BP 110/60 | HR 69 | Temp 98.3°F

## 2020-03-24 DIAGNOSIS — M21372 Foot drop, left foot: Secondary | ICD-10-CM

## 2020-03-24 DIAGNOSIS — R300 Dysuria: Secondary | ICD-10-CM

## 2020-03-24 DIAGNOSIS — K862 Cyst of pancreas: Secondary | ICD-10-CM | POA: Diagnosis not present

## 2020-03-24 DIAGNOSIS — R1900 Intra-abdominal and pelvic swelling, mass and lump, unspecified site: Secondary | ICD-10-CM | POA: Diagnosis not present

## 2020-03-24 LAB — POCT URINALYSIS DIPSTICK
Bilirubin, UA: NEGATIVE
Blood, UA: NEGATIVE
Glucose, UA: NEGATIVE
Ketones, UA: NEGATIVE
Nitrite, UA: NEGATIVE
Protein, UA: NEGATIVE
Spec Grav, UA: 1.025 (ref 1.010–1.025)
Urobilinogen, UA: NEGATIVE E.U./dL — AB
pH, UA: 5.5 (ref 5.0–8.0)

## 2020-03-24 NOTE — Patient Instructions (Signed)
We will set up CT abdomen to further assess.

## 2020-03-24 NOTE — Progress Notes (Signed)
Established Patient Office Visit  Subjective:  Patient ID: Emily Cannon, female    DOB: 16-Mar-1933  Age: 86 y.o. MRN: [id number]  CC:  Chief Complaint  Patient presents with  . Follow-up    HPI Emily Cannon presents for follow-up virtual visit a couple days ago.  Refer to that note for details  "Emily Cannon and her husband called to discuss several items.  She has had some low back pain and has been followed by orthopedic surgery.  She developed recent left foot drop and orthopedic surgery did not feel like there was any clear explanation from her MRI to explain that.  She has pending follow-up with neurology in October to consider EMG/nerve conduction studies.  Her foot drop is worse if anything.  She has been referred for AFO brace and they are waiting for insurance approval.  Emily Cannon had recent dysuria.  Urine culture grew out Proteus mirabilis.  She was treated with Keflex and did improve but now has some very mild intermittent burning.  No fever. No nausea or vomiting.  No flank pain.  She has become much more debilitated and husband would like to discuss possible wheelchair order.  She had recent MRI through [facility] and this apparently showed some cystic type lesions in the pancreas head and liver.  Patient's husband reportedly dropped off report yesterday but have not seen this yet.  There was suggestion for consideration of CT abdomen and pelvis with and without contrast with attention to the pancreatic head.  Patient denies any prior history of pancreatitis.  She has had some recent mild appetite and weight loss.  No epigastric abdominal pain.  No nausea or vomiting.  She has history of microscopic polyangiitis which has been relatively stable recently.  She does have recurrent rash on her lower extremity and husband would like to have that evaluated as well.  She has had history of recurrent shingles previously.  Current rash is nonpainful"  She is scheduled  to get AFO brace next week.  She denies any current burning with urination but has had some intermittently as above.  Has been requesting prescription for wheelchair to assist with transport.  She is struggling some with ambulation even with the walker   Patient relates asymptomatic rash upper thighs bilaterally.  Nonpainful.  Nonpruritic.  Denies any recent lower extremity edema.  She has not noted any other areas of rash.  No fever    She has had some loss of appetite and husband thinks she has lost about 10 pounds.  We were unable to get a good weight today because of her inability to stand unsupported because of her foot drop.  She is having some associated left leg pain which improved some with gabapentin.  She has pending follow-up with neurology.  Husband did bring in MRI report from orthopedist.  This showed "multi cystic change within the pancreatic head, 1 cm cyst in the hepatic hilum, and probable several subcentimeter cysts within the liver parenchyma.  "Recommendation was to get CT of the abdomen pre and post IV contrast with pancreatic protocol to evaluate for pancreatic head neoplasm versus benign cystic change  Clear had one episode of some recent nausea but none since.  No abdominal pain currently.  Past Medical History:  Diagnosis Date  . Allergic rhinitis, cause unspecified   . Allergy   . Anxiety state, unspecified   . Blood transfusion without reported diagnosis    2020  . Cataract    removed  both eyes   . Chronic kidney disease   . Disorder of bone and cartilage, unspecified   . Esophageal candidiasis ([place])   . Gastric ulcer   . History of CMV   . History of Helicobacter pylori infection   . Hypothyroid   . Irritable bowel syndrome   . Lumbago   . Microscopic polyangiitis ([place])   . Other and unspecified hyperlipidemia   . Perforation of tympanic membrane, unspecified   . Unspecified essential hypertension     Past Surgical History:  Procedure Laterality Date   . ABDOMINAL HYSTERECTOMY    . BIOPSY  01/02/2019   Procedure: BIOPSY;  Surgeon: Yetta Flock, MD;  Location: [facility] ENDOSCOPY;  Service: Gastroenterology;;  . cataract surgery  5/12 and 12/24/2012   both eyes  . COLONOSCOPY    . COLONOSCOPY WITH PROPOFOL N/A 01/02/2019   Procedure: COLONOSCOPY WITH PROPOFOL;  Surgeon: Yetta Flock, MD;  Location: [place];  Service: Gastroenterology;  Laterality: N/A;  . ESOPHAGOGASTRODUODENOSCOPY (EGD) WITH PROPOFOL N/A 01/02/2019   Procedure: ESOPHAGOGASTRODUODENOSCOPY (EGD) WITH PROPOFOL;  Surgeon: Yetta Flock, MD;  Location: [place];  Service: Gastroenterology;  Laterality: N/A;  . NSVD     x1  . POLYPECTOMY  01/02/2019   Procedure: POLYPECTOMY;  Surgeon: Yetta Flock, MD;  Location[facility] ENDOSCOPY;  Service: Gastroenterology;;  . POLYPECTOMY    . TONSILLECTOMY    . UPPER GASTROINTESTINAL ENDOSCOPY      Family History  Problem Relation Age of Onset  . Lung cancer Mother 37  . Lung disease Father 84  . COPD Other        sibling  . Colon cancer Neg Hx   . Colon polyps Neg Hx   . Esophageal cancer Neg Hx   . Rectal cancer Neg Hx   . Stomach cancer Neg Hx     Social History   Socioeconomic History  . Marital status: Married    Spouse name: Jeneen Rinks  . Number of children: 2  . Years of education: 69  . Highest education level: High school graduate  Occupational History  . Occupation: retired Research scientist (physical sciences)  Tobacco Use  . Smoking status: Never Smoker  . Smokeless tobacco: Never Used  Vaping Use  . Vaping Use: Never used  Substance and Sexual Activity  . Alcohol use: No  . Drug use: No  . Sexual activity: Not Currently  Other Topics Concern  . Not on file  Social History Narrative   Son is [name]--pt here    Married   2 children    retired   Investment banker, operational of Radio broadcast assistant Strain: [place]   . Difficulty of Paying Living Expenses: Not hard at all  Food Insecurity: No Food  Insecurity  . Worried About Charity fundraiser in the Last Year: Never true  . Ran Out of Food in the Last Year: Never true  Transportation Needs: No Transportation Needs  . Lack of Transportation (Medical): No  . Lack of Transportation (Non-Medical): No  Physical Activity: Insufficiently Active  . Days of Exercise per Week: 7 days  . Minutes of Exercise per Session: 10 min  Stress: Stress Concern Present  . Feeling of Stress : To some extent  Social Connections: Moderately Isolated  . Frequency of Communication with Friends and Family: More than three times a week  . Frequency of Social Gatherings with Friends and Family: Never  . Attends Religious Services: Never  . Active Member of Clubs  or Organizations: No  . Attends Archivist Meetings: Never  . Marital Status: Married  Human resources officer Violence:   . Fear of Current or Ex-Partner: Not on file  . Emotionally Abused: Not on file  . Physically Abused: Not on file  . Sexually Abused: Not on file    Outpatient Medications Prior to Visit  Medication Sig Dispense Refill  . amLODipine (NORVASC) 10 MG tablet TAKE ONE TABLET BY MOUTH DAILY 90 tablet 0  . atorvastatin (LIPITOR) 20 MG tablet Take 1 tablet (20 mg total) by mouth daily. 90 tablet 0  . calcitRIOL (ROCALTROL) 0.25 MCG capsule Take 0.25 mcg by mouth every Monday, Wednesday, and Friday.     . cephALEXin (KEFLEX) 500 MG capsule Take 1 capsule (500 mg total) by mouth 3 (three) times daily. 21 capsule 0  . Cholecalciferol (VITAMIN D-3 PO) Take 1 capsule by mouth every Monday, Wednesday, and Friday.    . esomeprazole (NEXIUM) 40 MG packet TAKE ONE PACKET BY MOUTH ONCE TO TWICE A DAY AS NEEDED BEFORE MEALS TO MANAGE SYMPTOMS 180 each 1  . furosemide (LASIX) 40 MG tablet Take 0.5 tablets (20 mg total) by mouth 3 (three) times a week. Monday, Wed, Friday (Patient taking differently: Take 40 mg by mouth every Monday, Wednesday, and Friday. ) 30 tablet   . gabapentin  (NEURONTIN) 100 MG capsule Take 1 capsule (100 mg total) by mouth at bedtime. 30 capsule 3  . hydrALAZINE (APRESOLINE) 50 MG tablet TAKE ONE TABLET BY MOUTH THREE TIMES A DAY 270 tablet 0  . levothyroxine (SYNTHROID) 50 MCG tablet TAKE ONE TABLET BY MOUTH EVERY MORNING BEFORE BREAKFAST 90 tablet 1  . metoprolol succinate (TOPROL-XL) 50 MG 24 hr tablet TAKE ONE TABLET BY MOUTH TWICE A DAY 180 tablet 0  . Multiple Vitamins-Minerals (CENTRUM SILVER 50+WOMEN) TABS Take 1 tablet by mouth daily with breakfast.    . ondansetron (ZOFRAN ODT) 4 MG disintegrating tablet Take 1 tablet (4 mg total) by mouth every 8 (eight) hours as needed for nausea or vomiting. 20 tablet 0  . traMADol (ULTRAM) 50 MG tablet TAKE ONE TABLET BY MOUTH EVERY 6 HOURS AS NEEDED FOR UP TO 5 DAYS 30 tablet 0   No facility-administered medications prior to visit.    Allergies  Allergen Reactions  . Alendronate Sodium Shortness Of Breath  . Amoxicillin Diarrhea    Severe diarrhea  . Codeine Nausea Only  . Valsartan Swelling    Gums/throat swell    ROS Review of Systems  Constitutional: Positive for fatigue. Negative for chills and fever.  Respiratory: Negative for shortness of breath.   Cardiovascular: Negative for chest pain.  Gastrointestinal: Negative for abdominal pain.  Genitourinary: Negative for hematuria.  Skin: Positive for rash.  Neurological: Positive for weakness.  Hematological: Negative for adenopathy. Does not bruise/bleed easily.      Objective:    Physical Exam Vitals reviewed.  Constitutional:      General: She is not in acute distress.    Appearance: Normal appearance. She is not ill-appearing.  Cardiovascular:     Rate and Rhythm: Normal rate and regular rhythm.  Pulmonary:     Effort: Pulmonary effort is normal.     Breath sounds: Normal breath sounds.  Abdominal:     Palpations: Abdomen is soft.     Tenderness: There is no abdominal tenderness.  Musculoskeletal:     Right lower leg:  No edema.     Left lower leg: No edema.  Skin:  Findings: Rash present.     Comments: She has fairly symmetric rash upper thighs bilaterally which is small pinpoint nonblanching and erythematous consistent with small petechiae.  She has some similar ones upper arms bilaterally.  Sparing of the trunk.  Sparing of the lower legs  Neurological:     Mental Status: She is alert.     BP 110/60 (BP Location: Right Arm, Patient Position: Sitting, Cuff Size: Normal)   Pulse 69   Temp 98.3 F (36.8 C) (Oral)   SpO2 96%  Wt Readings from Last 3 Encounters:  02/23/20 120 lb 11.2 oz (54.7 kg)  01/17/20 129 lb 1.6 oz (58.6 kg)  08/23/19 125 lb (56.7 kg)     Health Maintenance Due  Topic Date Due  . TETANUS/TDAP  07/28/2019  . INFLUENZA VACCINE  02/27/2020    There are no preventive care reminders to display for this patient.  Lab Results  Component Value Date   TSH 2.35 01/17/2020   Lab Results  Component Value Date   WBC 10.1 01/17/2020   HGB 11.7 (L) 01/17/2020   HCT 34.1 (L) 01/17/2020   MCV 91.9 01/17/2020   PLT 304.0 01/17/2020   Lab Results  Component Value Date   NA 140 01/17/2020   K 4.1 01/17/2020   CO2 26 01/17/2020   GLUCOSE 153 (H) 01/17/2020   BUN 23 01/17/2020   CREATININE 1.51 (H) 01/17/2020   BILITOT 0.4 01/17/2020   ALKPHOS 101 01/17/2020   AST 18 01/17/2020   ALT 15 01/17/2020   PROT 6.0 01/17/2020   ALBUMIN 4.3 01/17/2020   CALCIUM 9.3 01/17/2020   ANIONGAP 7 01/03/2019   GFR 32.78 (L) 01/17/2020   Lab Results  Component Value Date   CHOL 148 01/17/2020   Lab Results  Component Value Date   HDL 31.30 (L) 01/17/2020   Lab Results  Component Value Date   LDLCALC 65 09/11/2018   Lab Results  Component Value Date   TRIG (H) 01/17/2020    533.0 Triglyceride is over 400; calculations on Lipids are invalid.   Lab Results  Component Value Date   CHOLHDL 5 01/17/2020   Lab Results  Component Value Date   HGBA1C 6.4 01/23/2009        Assessment & Plan:   #1 history of microscopic polyangiitis.  She has had multisystem involvement and including renal dysfunction and is followed by multiple specialists including rheumatology and nephrology and has been relatively stable recently.  Currently off prednisone  #2 recent presentation here with left foot drop.  We referred to Ortho and MRI does not show any clear explanation for her foot drop.  She has pending follow-up with neurology and also has appointment to be set up for AFO brace  -Prescription for wheelchair to help with transport  #3 skin rash.  This looks to be small petechial lesions upper extremities and lower but predominately thighs bilaterally.  Denies any tight garments or edema. -Recheck CBC, comprehensive metabolic panel -Low threshold to get back into see rheumatologist.  They think her next scheduled follow up is November.  #4 recent abnormal MRI lumbar spine with comments above of multicystic changes pancreatic head  -Set up CT pancreas pre and postcontrast to further evaluate  #5 intermittent dysuria.  Recent UTI with Proteus mirabilis -Recheck urine culture today  #6 hypertension stable and at goal  No orders of the defined types were placed in this encounter.   Follow-up: No follow-ups on file.    Carolann Littler,  MD

## 2020-03-25 LAB — COMPREHENSIVE METABOLIC PANEL
AG Ratio: 2.7 (calc) — ABNORMAL HIGH (ref 1.0–2.5)
ALT: 16 U/L (ref 6–29)
AST: 16 U/L (ref 10–35)
Albumin: 4.3 g/dL (ref 3.6–5.1)
Alkaline phosphatase (APISO): 66 U/L (ref 37–153)
BUN/Creatinine Ratio: 15 (calc) (ref 6–22)
BUN: 22 mg/dL (ref 7–25)
CO2: 30 mmol/L (ref 20–32)
Calcium: 9.5 mg/dL (ref 8.6–10.4)
Chloride: 100 mmol/L (ref 98–110)
Creat: 1.43 mg/dL — ABNORMAL HIGH (ref 0.60–0.88)
Globulin: 1.6 g/dL (calc) — ABNORMAL LOW (ref 1.9–3.7)
Glucose, Bld: 102 mg/dL — ABNORMAL HIGH (ref 65–99)
Potassium: 3.9 mmol/L (ref 3.5–5.3)
Sodium: 138 mmol/L (ref 135–146)
Total Bilirubin: 0.5 mg/dL (ref 0.2–1.2)
Total Protein: 5.9 g/dL — ABNORMAL LOW (ref 6.1–8.1)

## 2020-03-25 LAB — CBC WITH DIFFERENTIAL/PLATELET
Absolute Monocytes: 1740 cells/uL — ABNORMAL HIGH (ref 200–950)
Basophils Absolute: 102 cells/uL (ref 0–200)
Basophils Relative: 0.7 %
Eosinophils Absolute: 116 cells/uL (ref 15–500)
Eosinophils Relative: 0.8 %
HCT: 36.5 % (ref 35.0–45.0)
Hemoglobin: 12.3 g/dL (ref 11.7–15.5)
Lymphs Abs: 3843 cells/uL (ref 850–3900)
MCH: 30.4 pg (ref 27.0–33.0)
MCHC: 33.7 g/dL (ref 32.0–36.0)
MCV: 90.1 fL (ref 80.0–100.0)
MPV: 10 fL (ref 7.5–12.5)
Monocytes Relative: 12 %
Neutro Abs: 8700 cells/uL — ABNORMAL HIGH (ref 1500–7800)
Neutrophils Relative %: 60 %
Platelets: 279 10*3/uL (ref 140–400)
RBC: 4.05 10*6/uL (ref 3.80–5.10)
RDW: 15 % (ref 11.0–15.0)
Total Lymphocyte: 26.5 %
WBC: 14.5 10*3/uL — ABNORMAL HIGH (ref 3.8–10.8)

## 2020-03-25 LAB — URINE CULTURE
MICRO NUMBER:: 10882008
SPECIMEN QUALITY:: ADEQUATE

## 2020-03-25 LAB — LIPASE: Lipase: 100 U/L — ABNORMAL HIGH (ref 7–60)

## 2020-03-27 ENCOUNTER — Telehealth: Payer: Self-pay | Admitting: Family Medicine

## 2020-03-27 ENCOUNTER — Other Ambulatory Visit: Payer: Self-pay

## 2020-03-27 MED ORDER — CEPHALEXIN 500 MG PO CAPS: 500.0000 mg | ORAL_CAPSULE | Freq: Three times a day (TID) | ORAL | 0 refills | Status: DC

## 2020-03-27 NOTE — Telephone Encounter (Signed)
Rx sent to vm

## 2020-03-27 NOTE — Telephone Encounter (Signed)
Left detailed message informing  of update. 

## 2020-03-27 NOTE — Telephone Encounter (Signed)
Patient needs last lab results to go to the neurologist tomorrow.  It is ok to leave a message with the results. Updated DPR in the system-- filled out today.

## 2020-03-28 ENCOUNTER — Telehealth: Payer: Self-pay | Admitting: Neurology

## 2020-03-28 ENCOUNTER — Encounter: Payer: Self-pay | Admitting: Neurology

## 2020-03-28 ENCOUNTER — Other Ambulatory Visit: Payer: Self-pay

## 2020-03-28 ENCOUNTER — Other Ambulatory Visit: Payer: Self-pay | Admitting: Family Medicine

## 2020-03-28 ENCOUNTER — Ambulatory Visit: Payer: Medicare Other | Admitting: Neurology

## 2020-03-28 VITALS — BP 126/62 | HR 78

## 2020-03-28 DIAGNOSIS — R531 Weakness: Secondary | ICD-10-CM

## 2020-03-28 DIAGNOSIS — R269 Unspecified abnormalities of gait and mobility: Secondary | ICD-10-CM

## 2020-03-28 DIAGNOSIS — R202 Paresthesia of skin: Secondary | ICD-10-CM | POA: Diagnosis not present

## 2020-03-28 NOTE — Progress Notes (Signed)
Chief Complaint  Patient presents with  . New Patient (Initial Visit)    She is here with her husband, [name]. Referred for evaluation for NCV/EMG. Bilateral foot drop. She is getting an AFO brace for her left foot this week.   Marland Kitchen PCP    Burchette, Alinda Sierras, MD  . Orthopaedics    Suella Broad, MD - referring provider    HISTORICAL  Emily Cannon is a 86 year old female seen in request by orthopedic surgeon Dr. Suella Broad and her primary care physician Dr. Carolann Littler for evaluation of gait abnormality, bilateral feet weakness, initial evaluation was with her husband in March 28, 2020  I reviewed and summarized the referring note. 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 Amil Amen receiving IV infusion every 6 months, patient does not know the name of medication, I assumed it was rituximab,  She also had kidney involvement, was treated by kidney physician Dr. 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 at the beginning of 2021, she had significant worsening since June 2021,  She started to have left leg numbness extending from top of left foot to lateral leg, left leg weakness, began to rely on her walker, continue to progress over the past few months, now also developed right foot numbness, weakness, even with walker, she has significant difficulty,  She also noted to have worsening memory loss, has occasionally bowel and bladder incontinence, she denies visual loss, no dysarthria, no dysphagia    REVIEW OF SYSTEMS: Full 14 system review of systems performed and  notable only for as above All other review of systems were negative.  ALLERGIES: Allergies  Allergen Reactions  . Alendronate Sodium Shortness Of Breath  . Amoxicillin Diarrhea    Severe diarrhea  . Codeine Nausea Only  . Valsartan Swelling    Gums/throat swell    HOME MEDICATIONS: Current Outpatient Medications  Medication Sig Dispense Refill  . amLODipine (NORVASC) 10 MG tablet TAKE ONE TABLET BY MOUTH DAILY 90 tablet 0  . atorvastatin (LIPITOR) 20 MG tablet Take 1 tablet (20 mg total) by mouth daily. 90 tablet 0  . calcitRIOL (ROCALTROL) 0.25 MCG capsule Take 0.25 mcg by mouth every Monday, Wednesday, and Friday.     . esomeprazole (NEXIUM) 40 MG packet TAKE ONE PACKET BY MOUTH ONCE TO TWICE A DAY AS NEEDED BEFORE MEALS TO MANAGE SYMPTOMS 180 each 1  . furosemide (LASIX) 40 MG tablet Take 0.5 tablets (20 mg total) by mouth 3 (three) times a week. Monday, Wed, Friday (Patient taking differently: Take 40 mg by mouth every Monday, Wednesday, and Friday. ) 30 tablet   . gabapentin (NEURONTIN) 100 MG capsule Take 1 capsule (100 mg total) by mouth at bedtime. 30 capsule 3  . hydrALAZINE (APRESOLINE) 50 MG tablet TAKE ONE TABLET BY MOUTH THREE TIMES A DAY 270 tablet 0  . levothyroxine (SYNTHROID) 50 MCG tablet TAKE ONE TABLET BY MOUTH EVERY MORNING BEFORE BREAKFAST 90 tablet 1  . metoprolol succinate (TOPROL-XL) 50 MG 24 hr tablet TAKE ONE TABLET BY MOUTH TWICE A DAY 180 tablet 0  . Multiple Vitamins-Minerals (CENTRUM SILVER 50+WOMEN) TABS  Take 1 tablet by mouth daily with breakfast.    . ondansetron (ZOFRAN ODT) 4 MG disintegrating tablet Take 1 tablet (4 mg total) by mouth every 8 (eight) hours as needed for nausea or vomiting. 20 tablet 0  . traMADol (ULTRAM) 50 MG tablet TAKE ONE TABLET BY MOUTH EVERY 6 HOURS AS NEEDED FOR UP TO 5 DAYS 30 tablet 0  . valACYclovir (VALTREX) 1000 MG tablet Take 1,000 mg by mouth 3 (three) times daily. Started 03/27/20 - taking for 7 days.     No current  facility-administered medications for this visit.    PAST MEDICAL HISTORY: Past Medical History:  Diagnosis Date  . Allergic rhinitis, cause unspecified   . Allergy   . Anxiety state, unspecified   . Blood transfusion without reported diagnosis    2020  . Cataract    removed both eyes   . Chronic kidney disease   . Disorder of bone and cartilage, unspecified   . Esophageal candidiasis ([place])   . Foot drop   . Gastric ulcer   . History of CMV   . History of Helicobacter pylori infection   . Hypothyroid   . Irritable bowel syndrome   . Lumbago   . Microscopic polyangiitis ([place])   . Other and unspecified hyperlipidemia   . Perforation of tympanic membrane, unspecified   . Unspecified essential hypertension     PAST SURGICAL HISTORY: Past Surgical History:  Procedure Laterality Date  . ABDOMINAL HYSTERECTOMY    . BIOPSY  01/02/2019   Procedure: BIOPSY;  Surgeon: Yetta Flock, MD;  Location: [facility] ENDOSCOPY;  Service: Gastroenterology;;  . cataract surgery  5/12 and 12/24/2012   both eyes  . COLONOSCOPY    . COLONOSCOPY WITH PROPOFOL N/A 01/02/2019   Procedure: COLONOSCOPY WITH PROPOFOL;  Surgeon: Yetta Flock, MD;  Location: [place];  Service: Gastroenterology;  Laterality: N/A;  . ESOPHAGOGASTRODUODENOSCOPY (EGD) WITH PROPOFOL N/A 01/02/2019   Procedure: ESOPHAGOGASTRODUODENOSCOPY (EGD) WITH PROPOFOL;  Surgeon: Yetta Flock, MD;  Location: [place];  Service: Gastroenterology;  Laterality: N/A;  . NSVD     x1  . POLYPECTOMY  01/02/2019   Procedure: POLYPECTOMY;  Surgeon: Yetta Flock, MD;  Location: [facility] ENDOSCOPY;  Service: Gastroenterology;;  . POLYPECTOMY    . TONSILLECTOMY    . UPPER GASTROINTESTINAL ENDOSCOPY      FAMILY HISTORY: Family History  Problem Relation Age of Onset  . Lung cancer Mother 39  . Lung disease Father 63  . COPD Other        sibling  . Colon cancer Neg Hx   . Colon polyps Neg Hx   . Esophageal cancer Neg Hx    . Rectal cancer Neg Hx   . Stomach cancer Neg Hx     SOCIAL HISTORY: Social History   Socioeconomic History  . Marital status: Married    Spouse name: Jeneen Rinks  . Number of children: 2  . Years of education: 52  . Highest education level: High school graduate  Occupational History  . Occupation: retired Research scientist (physical sciences)  Tobacco Use  . Smoking status: Never Smoker  . Smokeless tobacco: Never Used  Vaping Use  . Vaping Use: Never used  Substance and Sexual Activity  . Alcohol use: No  . Drug use: No  . Sexual activity: Not Currently  Other Topics Concern  . Not on file  Social History Narrative   Lives at home with her husband.   Right-handed.   1-2 cups coffee daily, occasional  tea or soda.   Social Determinants of Health   Financial Resource Strain: Low Risk   . Difficulty of Paying Living Expenses: Not hard at all  Food Insecurity: No Food Insecurity  . Worried About Charity fundraiser in the Last Year: Never true  . Ran Out of Food in the Last Year: Never true  Transportation Needs: No Transportation Needs  . Lack of Transportation (Medical): No  . Lack of Transportation (Non-Medical): No  Physical Activity: Insufficiently Active  . Days of Exercise per Week: 7 days  . Minutes of Exercise per Session: 10 min  Stress: Stress Concern Present  . Feeling of Stress : To some extent  Social Connections: Moderately Isolated  . Frequency of Communication with Friends and Family: More than three times a week  . Frequency of Social Gatherings with Friends and Family: Never  . Attends Religious Services: Never  . Active Member of Clubs or Organizations: No  . Attends Archivist Meetings: Never  . Marital Status: Married  Human resources officer Violence:   . Fear of Current or Ex-Partner: Not on file  . Emotionally Abused: Not on file  . Physically Abused: Not on file  . Sexually Abused: Not on file     PHYSICAL EXAM   Vitals:   03/28/20 1419  BP: 126/62   Pulse: 78   Not recorded     There is no height or weight on file to calculate BMI.  PHYSICAL EXAMNIATION:  Gen: NAD, conversant, well nourised, well groomed                     Cardiovascular: Regular rate rhythm, no peripheral edema, warm, nontender. Eyes: Conjunctivae clear without exudates or hemorrhage Neck: Supple, no carotid bruits. Pulmonary: Clear to auscultation bilaterally   NEUROLOGICAL EXAM:  MENTAL STATUS: Speech:    Speech is normal; fluent and spontaneous with normal comprehension.  Cognition:     Orientation to time, place and person     Normal recent and remote memory     Normal Attention span and concentration     Normal Language, naming, repeating,spontaneous speech     Fund of knowledge   CRANIAL NERVES: CN II: Visual fields are full to confrontation. Pupils are round equal and briskly reactive to light. CN III, IV, VI: extraocular movement are normal. No ptosis. CN V: Facial sensation is intact to light touch CN VII: Face is symmetric with normal eye closure  CN VIII: Hearing is normal to causal conversation. CN IX, X: Phonation is normal. CN XI: Head turning and shoulder shrug are intact  MOTOR: Significant bilateral lower extremity pitting edema, upper extremity motor strength is normal, lower extremity motor strength (R/L) Hip flexion 4+/4, knee flexion 4+/4, knee extension 4+/4, ankle dorsiflexion 2/1, plantarflexion 2/2  REFLEXES: Reflexes are 2+ and symmetric at the biceps, triceps, knees, and absent at ankles. Plantar responses are flexor.  SENSORY: Length dependent decreased light touch, pinprick, vibratory sensation to knee level COORDINATION: There is no trunk or limb dysmetria noted.  GAIT/STANCE: Deferred   DIAGNOSTIC DATA (LABS, IMAGING, TESTING) - I reviewed patient records, labs, notes, testing and imaging myself where available.   ASSESSMENT AND PLAN  Conya DENNIE VECCHIO is a 86 y.o. female   Progressive worsening gait  abnormality,  Profound distal leg weakness, left worse than right, hyperreflexia on examination, patient also reported occasionally bowel and bladder incontinence  Potential localized to cervical/thoracic/brain  Proceed with MRI of the brain, cervical, thoracic  spine  EMG nerve conduction study   Get Medical Record from   Dr. Leigh Aurora Rheumatologist in [place], [place] Address: [address] [place], [place] [address] Phone: [phone]  Dr. Harrie Jeans [facility] [address] [place], [place] [address] [phone]  Dahari D. Rolena Infante, MD  ([phone])  Orthopedic surgeon [address] [place]  [phone]   Marcial Pacas, M.D. Ph.D.  [facility] [address] [place], [place] [address] Ph: [phone] Fax: [phone]  CC:  [address], [place],  [place] [address]

## 2020-03-28 NOTE — Patient Instructions (Signed)
Get Medical Record    Dr. Leigh Aurora Rheumatologist in [place], [place] Address: [address], [place], [place] [address] Phone: [phone]  Dr. Harrie Jeans [facility] [address] [place], [place] [address] [phone]  Dahari D. Rolena Infante, [place]  (89)  Orthopedic surgeon [address] [place] [address]  [phone]

## 2020-03-28 NOTE — Telephone Encounter (Signed)
Get Medical Record    Dr. Leigh Aurora Rheumatologist in [place], [place] Address: [address] [place], [place] [address] Phone: [phone]  Dr. Harrie Jeans [facility] [address] [place],  [address] [phone]  Dahari D. Rolena Infante, [place]  (89)  Orthopedic surgeon [address] [place] [address]  [phone]

## 2020-03-29 ENCOUNTER — Telehealth: Payer: Self-pay | Admitting: Neurology

## 2020-03-29 NOTE — Telephone Encounter (Signed)
[employer] medicare order sent to GI. No auth they will reach out to the patient to schedule.  

## 2020-03-30 ENCOUNTER — Telehealth: Payer: Self-pay | Admitting: *Deleted

## 2020-03-30 NOTE — Telephone Encounter (Signed)
I spoke to the patient's husband. He is going to get a copy of the MRI disc of her lumbar spine and bring it by our office (completed through [facility]). He will calling [facility] today to schedule her MRI scans of brain, cervical and thoracic.

## 2020-03-30 NOTE — Telephone Encounter (Signed)
  Pt husband called has a question about an up coming MRI. Please call [phone]

## 2020-04-04 ENCOUNTER — Telehealth: Payer: Self-pay | Admitting: Family Medicine

## 2020-04-04 NOTE — Telephone Encounter (Signed)
Pt would like a refill on:  1.Gabapentin 100 mg. Pt takes 2 capsule at bedtime not 1 capsule and pt needs a new RX with the new direction.    2.Metoprolol succinate 50 mg   Send to: [facility], [place], [place] [address]  Phone:  [phone] Fax:  [phone]

## 2020-04-05 MED ORDER — GABAPENTIN 100 MG PO CAPS: ORAL_CAPSULE | ORAL | 3 refills | Status: AC

## 2020-04-05 NOTE — Telephone Encounter (Signed)
Needs Gabapentin 2 at night

## 2020-04-05 NOTE — Telephone Encounter (Signed)
Can you please change the Rx sig for Gabepentin. Pt stated she is taking 2 at night and the last Rx was not sent in correctly per pt spouse.

## 2020-04-05 NOTE — Telephone Encounter (Signed)
Pt husband is calling again about his wife medication. She really needs it as soon as possible  Please call him at [phone]

## 2020-04-06 ENCOUNTER — Other Ambulatory Visit: Payer: Self-pay | Admitting: *Deleted

## 2020-04-06 DIAGNOSIS — I10 Essential (primary) hypertension: Secondary | ICD-10-CM

## 2020-04-06 MED ORDER — METOPROLOL SUCCINATE ER 50 MG PO TB24: 50.0000 mg | ORAL_TABLET | Freq: Two times a day (BID) | ORAL | 0 refills | Status: DC

## 2020-04-21 ENCOUNTER — Telehealth: Payer: Self-pay | Admitting: Family Medicine

## 2020-04-21 NOTE — Telephone Encounter (Signed)
Pt husband called to see if it is okay for the pt to have the booster shot being that she has health conditions.  Please advise

## 2020-04-21 NOTE — Telephone Encounter (Signed)
Patient's husband called and given the message from Dr. Elease Hashimoto below, he verbalized understanding.

## 2020-04-21 NOTE — Telephone Encounter (Signed)
yes

## 2020-04-21 NOTE — Telephone Encounter (Signed)
Please advise 

## 2020-04-24 ENCOUNTER — Ambulatory Visit
Admission: RE | Admit: 2020-04-24 | Discharge: 2020-04-24 | Disposition: A | Discharge status: Home / Self Care | Payer: Medicare Other | Source: Ambulatory Visit | Attending: Neurology | Admitting: Neurology

## 2020-04-24 ENCOUNTER — Telehealth: Payer: Self-pay | Admitting: Neurology

## 2020-04-24 ENCOUNTER — Other Ambulatory Visit: Payer: Self-pay

## 2020-04-24 DIAGNOSIS — R202 Paresthesia of skin: Secondary | ICD-10-CM

## 2020-04-24 DIAGNOSIS — R269 Unspecified abnormalities of gait and mobility: Secondary | ICD-10-CM

## 2020-04-24 DIAGNOSIS — R531 Weakness: Secondary | ICD-10-CM

## 2020-04-24 NOTE — Telephone Encounter (Signed)
I spoke to the patient's husband on DPR. He verbalized understanding of the MRI findings below. She will keep her pending appt on 05/08/20 for NCV/EMG.

## 2020-04-24 NOTE — Telephone Encounter (Signed)
IMPRESSION: No evidence of acute intracranial abnormality, including acute or recent subacute infarction.  Mild generalized atrophy of the brain with mild chronic small vessel ischemic disease.  Mild ethmoid and maxillary sinus mucosal thickening.  Right mastoid effusion.  IMPRESSION: 1. No acute osseous abnormality in the thoracic spine. Mild levoconvex upper thoracic scoliosis.  2. Widespread thoracic spine degeneration. Left paracentral disc herniations at T7-T8 and T8-T9 result in mild spinal stenosis and spinal cord mass, but no cord signal abnormality is identified.  3. Widespread facet hypertrophy with associated neural foraminal stenosis, severe at the bilateral T8 and moderate at the bilateral T7 nerve levels nerve levels.  4. Small degenerative ganglion cyst suspected, associated with medial left shoulder musculature posterior to the scapula. Subtle evidence of this finding on the chest CT last year, not significantly changed since that time.  5. Right thyroid lobe nodule 2.3 cm. No follow-up Thyroid ultrasound recommended unless clinically warranted.   IMPRESSION: Cervical spondylosis as outlined with findings most notably as follows.  At C5-C6, there is moderate disc degeneration. A posterior disc osteophyte complex contributes to multifactorial moderate spinal canal stenosis, contacting and mildly flattening the ventral spinal cord. Hypertrophied ligamentum flavum also contacts the dorsal aspect of the spinal cord. Bilateral neural foraminal narrowing (mild/moderate right, moderate/severe left).  At C6-C7, there is moderate disc degeneration. A posterior disc osteophyte complex contributes to multifactorial mild/moderate spinal canal stenosis. Mild relative left neural foraminal narrowing.  No more than mild spinal canal stenosis at the remaining levels. Additional sites of neural foraminal narrowing as outlined and greatest bilaterally at  C3-C4 (moderate right, moderate/severe left).  Trace multilevel spondylolisthesis as detailed.  Mild chronic C7 superior endplate deformity.   Please call patient, MRI of brain showed mild age-related changes, no acute abnormality, generalized atrophy, mild supratentorium small vessel disease  MRI of cervical showed multilevel degenerative changes, there was no mild to moderate canal stenosis, no evidence of spinal cord compression, variable degree of foraminal narrowing  MRI of thoracic spine showed evidence of multilevel degenerative changes, no evidence of spinal cord compression.

## 2020-04-25 ENCOUNTER — Inpatient Hospital Stay: Admission: RE | Admit: 2020-04-25 | Payer: Medicare Other | Source: Ambulatory Visit

## 2020-04-27 ENCOUNTER — Ambulatory Visit
Admission: RE | Admit: 2020-04-27 | Discharge: 2020-04-27 | Disposition: A | Discharge status: Home / Self Care | Payer: Medicare Other | Source: Ambulatory Visit | Attending: Family Medicine | Admitting: Family Medicine

## 2020-04-27 DIAGNOSIS — R1900 Intra-abdominal and pelvic swelling, mass and lump, unspecified site: Secondary | ICD-10-CM

## 2020-04-27 MED ORDER — IOPAMIDOL (ISOVUE-300) INJECTION 61%
80.0000 mL | Freq: Once | INTRAVENOUS | Status: AC | PRN
  Administered 2020-04-27: 80 mL via INTRAVENOUS

## 2020-05-02 ENCOUNTER — Other Ambulatory Visit: Payer: Self-pay | Admitting: Family Medicine

## 2020-05-08 ENCOUNTER — Other Ambulatory Visit: Payer: Self-pay

## 2020-05-08 ENCOUNTER — Ambulatory Visit: Payer: Medicare Other | Admitting: Neurology

## 2020-05-08 ENCOUNTER — Ambulatory Visit (INDEPENDENT_AMBULATORY_CARE_PROVIDER_SITE_OTHER): Payer: Medicare Other | Admitting: Neurology

## 2020-05-08 ENCOUNTER — Telehealth: Payer: Self-pay | Admitting: Neurology

## 2020-05-08 DIAGNOSIS — R531 Weakness: Secondary | ICD-10-CM

## 2020-05-08 DIAGNOSIS — G6289 Other specified polyneuropathies: Secondary | ICD-10-CM

## 2020-05-08 DIAGNOSIS — R202 Paresthesia of skin: Secondary | ICD-10-CM

## 2020-05-08 DIAGNOSIS — R269 Unspecified abnormalities of gait and mobility: Secondary | ICD-10-CM

## 2020-05-08 DIAGNOSIS — G629 Polyneuropathy, unspecified: Secondary | ICD-10-CM | POA: Insufficient documentation

## 2020-05-08 NOTE — Progress Notes (Signed)
No chief complaint on file.   HISTORICAL  Emily Cannon is a 86 year old female seen in request by orthopedic surgeon Dr. Suella Broad and her primary care physician Dr. Carolann Littler for evaluation of gait abnormality, bilateral feet weakness, initial evaluation was with her husband in March 28, 2020  I reviewed and summarized the referring note. 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 Amil Amen receiving IV infusion every 6 months, patient does not know the name of medication, I assumed it was rituximab,  She also had kidney involvement, was treated by kidney physician Dr. 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 at the beginning of 2021, she had significant worsening since June 2021,  She started to have left leg numbness extending from top of left foot to lateral leg, left leg weakness, began to rely on her walker, continue to progress over the past few months, now also developed right foot numbness, weakness, even with walker, she has significant difficulty,  She also noted to have worsening memory loss, has occasionally bowel and bladder incontinence, she denies visual loss, no dysarthria, no dysphagia  UPDATE May 08 2020:  MRI of brain on Sept 27 2021 showed mild generalized atrophy, no acute abnormalities. MRI of cervical spine showed multi-level degenerative changes, C5-6, C6-7 moderate canal stenosis. MRI of thoracic spine: widespread thoracic spine degeneration,  Mild spinal stenosis, Right thyroid lobe nodule 2.3 cm.  Laboratory evaluation showed elevated  WBC 14.5, Hg 12.3, CMP creat 1.43, TSH 2.35, triglyceride 533  EMG nerve conduction study today showed evidence of length dependent severe sensorimotor polyneuropathy, there is also evidence of chronic neuropathic changes involving left cervical myotomes, T1, C8 and bilateral lumbar sacral myotomes,   REVIEW OF SYSTEMS: Full 14 system review of systems performed and notable only for as above All other review of systems were negative.  ALLERGIES: Allergies  Allergen Reactions  . Alendronate Sodium Shortness Of Breath  . Amoxicillin Diarrhea    Severe diarrhea  . Codeine Nausea Only  . Valsartan Swelling    Gums/throat swell    HOME MEDICATIONS: Current Outpatient Medications  Medication Sig Dispense Refill  . amLODipine (NORVASC) 10 MG tablet TAKE ONE TABLET BY MOUTH DAILY 90 tablet 0  . atorvastatin (LIPITOR) 20 MG tablet TAKE ONE TABLET BY MOUTH DAILY 90 tablet 0  . calcitRIOL (ROCALTROL) 0.25 MCG capsule Take 0.25 mcg by mouth every Monday, Wednesday, and Friday.     . esomeprazole (NEXIUM) 40 MG packet TAKE ONE PACKET BY MOUTH ONCE TO TWICE A DAY AS NEEDED BEFORE MEALS TO MANAGE SYMPTOMS 180 each 1  . furosemide (LASIX) 40 MG tablet Take 0.5 tablets (20 mg total) by mouth 3 (three) times a week. Monday, Wed, Friday (Patient taking differently: Take 40 mg by mouth every Monday, Wednesday, and Friday. ) 30 tablet   . gabapentin (NEURONTIN) 100 MG capsule Take 2 capsules by mouth at night for neuropathy pain 180 capsule 3  . hydrALAZINE (APRESOLINE) 50 MG tablet TAKE ONE TABLET BY MOUTH THREE TIMES A DAY 270 tablet 0  . levothyroxine (SYNTHROID) 50 MCG tablet TAKE ONE TABLET BY MOUTH EVERY  MORNING BEFORE BREAKFAST 90 tablet 1  . metoprolol succinate (TOPROL-XL) 50 MG 24 hr tablet TAKE ONE TABLET BY MOUTH TWICE A DAY 180 tablet 0  . metoprolol succinate (TOPROL-XL) 50 MG 24 hr tablet Take 1 tablet (50 mg total) by mouth 2 (two) times daily. Take with or immediately following a meal. 180  tablet 0  . Multiple Vitamins-Minerals (CENTRUM SILVER 50+WOMEN) TABS Take 1 tablet by mouth daily with breakfast.    . ondansetron (ZOFRAN ODT) 4 MG disintegrating tablet Take 1 tablet (4 mg total) by mouth every 8 (eight) hours as needed for nausea or vomiting. 20 tablet 0  . traMADol (ULTRAM) 50 MG tablet TAKE ONE TABLET BY MOUTH EVERY 6 HOURS AS NEEDED FOR UP TO 5 DAYS 30 tablet 0  . valACYclovir (VALTREX) 1000 MG tablet Take 1,000 mg by mouth 3 (three) times daily. Started 03/27/20 - taking for 7 days.     No current facility-administered medications for this visit.    PAST MEDICAL HISTORY: Past Medical History:  Diagnosis Date  . Allergic rhinitis, cause unspecified   . Allergy   . Anxiety state, unspecified   . Blood transfusion without reported diagnosis    2020  . Cataract    removed both eyes   . Chronic kidney disease   . Disorder of bone and cartilage, unspecified   . Esophageal candidiasis ([place])   . Foot drop   . Gastric ulcer   . History of CMV   . History of Helicobacter pylori infection   . Hypothyroid   . Irritable bowel syndrome   . Lumbago   . Microscopic polyangiitis ([place])   . Other and unspecified hyperlipidemia   . Perforation of tympanic membrane, unspecified   . Unspecified essential hypertension     PAST SURGICAL HISTORY: Past Surgical History:  Procedure Laterality Date  . ABDOMINAL HYSTERECTOMY    . BIOPSY  01/02/2019   Procedure: BIOPSY;  Surgeon: Yetta Flock, MD;  Location[facility] ENDOSCOPY;  Service: Gastroenterology;;  . cataract surgery  5/12 and 12/24/2012   both eyes  . COLONOSCOPY    . COLONOSCOPY WITH PROPOFOL N/A 01/02/2019   Procedure: COLONOSCOPY WITH PROPOFOL;  Surgeon: Yetta Flock, MD;  Location: [facility];  Service: Gastroenterology;  Laterality: N/A;  . ESOPHAGOGASTRODUODENOSCOPY (EGD) WITH PROPOFOL N/A 01/02/2019   Procedure: ESOPHAGOGASTRODUODENOSCOPY (EGD) WITH PROPOFOL;  Surgeon: Yetta Flock, MD;   Location: [facility];  Service: Gastroenterology;  Laterality: N/A;  . NSVD     x1  . POLYPECTOMY  01/02/2019   Procedure: POLYPECTOMY;  Surgeon: Yetta Flock, MD;  Location: [facility] ENDOSCOPY;  Service: Gastroenterology;;  . POLYPECTOMY    . TONSILLECTOMY    . UPPER GASTROINTESTINAL ENDOSCOPY      FAMILY HISTORY: Family History  Problem Relation Age of Onset  . Lung cancer Mother 85  . Lung disease Father 41  . COPD Other        sibling  . Colon cancer Neg Hx   . Colon polyps Neg Hx   . Esophageal cancer Neg Hx   . Rectal cancer Neg Hx   . Stomach cancer Neg Hx     SOCIAL HISTORY: Social History   Socioeconomic History  . Marital status: Married    Spouse name: Jeneen Rinks  . Number of children: 2  . Years of education: 32  . Highest education level: High school graduate  Occupational History  . Occupation: retired Research scientist (physical sciences)  Tobacco Use  . Smoking status: Never Smoker  .  Smokeless tobacco: Never Used  Vaping Use  . Vaping Use: Never used  Substance and Sexual Activity  . Alcohol use: No  . Drug use: No  . Sexual activity: Not Currently  Other Topics Concern  . Not on file  Social History Narrative   Lives at home with her husband.   Right-handed.   1-2 cups coffee daily, occasional tea or soda.   Social Determinants of Health   Financial Resource Strain: Low Risk   . Difficulty of Paying Living Expenses: Not hard at all  Food Insecurity: No Food Insecurity  . Worried About Charity fundraiser in the Last Year: Never true  . Ran Out of Food in the Last Year: Never true  Transportation Needs: No Transportation Needs  . Lack of Transportation (Medical): No  . Lack of Transportation (Non-Medical): No  Physical Activity: Insufficiently Active  . Days of Exercise per Week: 7 days  . Minutes of Exercise per Session: 10 min  Stress: Stress Concern Present  . Feeling of Stress : To some extent  Social Connections: Moderately Isolated  . Frequency of  Communication with Friends and Family: More than three times a week  . Frequency of Social Gatherings with Friends and Family: Never  . Attends Religious Services: Never  . Active Member of Clubs or Organizations: No  . Attends Archivist Meetings: Never  . Marital Status: Married  Human resources officer Violence:   . Fear of Current or Ex-Partner: Not on file  . Emotionally Abused: Not on file  . Physically Abused: Not on file  . Sexually Abused: Not on file     PHYSICAL EXAM  PHYSICAL EXAMNIATION:  Gen: NAD, conversant, well nourised, well groomed                     Cardiovascular: Regular rate rhythm, no peripheral edema, warm, nontender. Eyes: Conjunctivae clear without exudates or hemorrhage Neck: Supple, no carotid bruits. Pulmonary: Clear to auscultation bilaterally   NEUROLOGICAL EXAM:  MENTAL STATUS: Speech/cognition Awake, alert, oriented to history taking care of conversation   CRANIAL NERVES: CN II: Visual fields are full to confrontation. Pupils are round equal and briskly reactive to light. CN III, IV, VI: extraocular movement are normal. No ptosis. CN V: Facial sensation is intact to light touch CN VII: Face is symmetric with normal eye closure  CN VIII: Hearing is normal to causal conversation. CN IX, X: Phonation is normal. CN XI: Head turning and shoulder shrug are intact  MOTOR: Significant bilateral lower extremity pitting edema, upper extremity motor strength is normal, lower extremity motor strength (R/L) Hip flexion 4+/4, knee flexion 4+/4, knee extension 4+/4, ankle dorsiflexion 2/1, plantarflexion 2/2  REFLEXES: Reflexes are 2+ and symmetric at the biceps, triceps, knees, and absent at ankles. Plantar responses are flexor.  SENSORY: Length dependent decreased light touch, pinprick, vibratory sensation to knee level  COORDINATION: There is no trunk or limb dysmetria noted.  GAIT/STANCE: Deferred   DIAGNOSTIC DATA (LABS, IMAGING,  TESTING) - I reviewed patient records, labs, notes, testing and imaging myself where available.   ASSESSMENT AND PLAN  Zabrina JONELL Emily Cannon is a 86 y.o. female   Progressive worsening gait abnormality,  Profound distal leg weakness, left worse than right, hyperreflexia on examination, patient also reported occasionally bowel and bladder incontinence  MRI of cervical and thoracic spine showed multilevel degenerative changes, with mild to moderate canal stenosis, but would not explain her profound bilateral lower extremity chronic neuropathic changes  on needle examinations, and left cervical myotomes chronic neuropathic changes,  Get MRI of lumbar spine from outside clinic  Differentiation diagnosis include lumbar radiculopathy, with superimposed severe sensorimotor polyneuropathy, even motor neuron disease  Referral to [facility] neuromuscular clinic        Get Medical Record from   Dr. Leigh Aurora Rheumatologist in [place], [place] Address: [address], [place], [place] [address] Phone: [phone]  Dr. Harrie Jeans [facility] [address] [place], [place] [address] [phone]  Dahari D. Rolena Infante, MD  (89)  Orthopedic surgeon [address] [place]  [phone]   Marcial Pacas, M.D. Ph.D.  [facility] [address] [place], [place] [address] Ph: [phone] Fax: [phone]  CC:  [address], [place],  [place] [address]

## 2020-05-08 NOTE — Progress Notes (Signed)
HISTORICAL  Emily Cannon is a 86 year old female seen in request by orthopedic surgeon Dr. Suella Broad and her primary care physician Dr. Carolann Littler for evaluation of gait abnormality, bilateral feet weakness, initial evaluation was with her husband in March 28, 2020  I reviewed and summarized the referring note. 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 Amil Amen receiving IV infusion every 6 months, patient does not know the name of medication, I assumed it was rituximab,  She also had kidney involvement, was treated by kidney physician Dr. 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 at the beginning of 2021, she had significant worsening since June 2021,  She started to have left leg numbness extending from top of left foot to lateral leg, left leg weakness, began to rely on her walker, continue to progress over the past few months, now also developed right foot numbness, weakness, even with walker, she has significant difficulty,  She also noted to have worsening memory loss, has occasionally bowel and bladder incontinence, she denies visual loss, no dysarthria, no dysphagia  UPDATE May 08 2020:    REVIEW OF SYSTEMS: Full 14 system review of systems performed and notable only for as above All other review of systems were negative.  ALLERGIES: Allergies  Allergen Reactions  . Alendronate Sodium Shortness Of Breath  . Amoxicillin Diarrhea    Severe diarrhea  . Codeine Nausea Only  . Valsartan Swelling    Gums/throat swell    HOME MEDICATIONS: Current  Outpatient Medications  Medication Sig Dispense Refill  . amLODipine (NORVASC) 10 MG tablet TAKE ONE TABLET BY MOUTH DAILY 90 tablet 0  . atorvastatin (LIPITOR) 20 MG tablet TAKE ONE TABLET BY MOUTH DAILY 90 tablet 0  . calcitRIOL (ROCALTROL) 0.25 MCG capsule Take 0.25 mcg by mouth every Monday, Wednesday, and Friday.     . esomeprazole (NEXIUM) 40 MG packet TAKE ONE PACKET BY MOUTH ONCE TO TWICE A DAY AS NEEDED BEFORE MEALS TO MANAGE SYMPTOMS 180 each 1  . furosemide (LASIX) 40 MG tablet Take 0.5 tablets (20 mg total) by mouth 3 (three) times a week. Monday, Wed, Friday (Patient taking differently: Take 40 mg by mouth every Monday, Wednesday, and Friday. ) 30 tablet   . gabapentin (NEURONTIN) 100 MG capsule Take 2 capsules by mouth at night for neuropathy pain 180 capsule 3  . hydrALAZINE (APRESOLINE) 50 MG tablet TAKE ONE TABLET BY MOUTH THREE TIMES A DAY 270 tablet 0  . levothyroxine (SYNTHROID) 50 MCG tablet TAKE ONE TABLET BY MOUTH EVERY MORNING BEFORE BREAKFAST 90 tablet 1  . metoprolol succinate (TOPROL-XL) 50 MG 24 hr tablet TAKE ONE TABLET BY MOUTH TWICE A DAY 180 tablet 0  . metoprolol succinate (TOPROL-XL) 50 MG 24 hr tablet Take 1 tablet (50 mg total) by mouth 2 (two) times daily. Take with or immediately following a meal. 180 tablet 0  . Multiple Vitamins-Minerals (CENTRUM SILVER 50+WOMEN) TABS Take 1 tablet by mouth daily with breakfast.    . ondansetron (ZOFRAN ODT) 4 MG disintegrating tablet Take 1 tablet (4 mg total) by mouth every 8 (eight) hours as needed for  nausea or vomiting. 20 tablet 0  . traMADol (ULTRAM) 50 MG tablet TAKE ONE TABLET BY MOUTH EVERY 6 HOURS AS NEEDED FOR UP TO 5 DAYS 30 tablet 0  . valACYclovir (VALTREX) 1000 MG tablet Take 1,000 mg by mouth 3 (three) times daily. Started 03/27/20 - taking for 7 days.     No current facility-administered medications for this visit.    PAST MEDICAL HISTORY: Past Medical History:  Diagnosis Date  . Allergic rhinitis, cause  unspecified   . Allergy   . Anxiety state, unspecified   . Blood transfusion without reported diagnosis    2020  . Cataract    removed both eyes   . Chronic kidney disease   . Disorder of bone and cartilage, unspecified   . Esophageal candidiasis ([facility])   . Foot drop   . Gastric ulcer   . History of CMV   . History of Helicobacter pylori infection   . Hypothyroid   . Irritable bowel syndrome   . Lumbago   . Microscopic polyangiitis ([place])   . Other and unspecified hyperlipidemia   . Perforation of tympanic membrane, unspecified   . Unspecified essential hypertension     PAST SURGICAL HISTORY: Past Surgical History:  Procedure Laterality Date  . ABDOMINAL HYSTERECTOMY    . BIOPSY  01/02/2019   Procedure: BIOPSY;  Surgeon: Yetta Flock, MD;  Location: [facility] ENDOSCOPY;  Service: Gastroenterology;;  . cataract surgery  5/12 and 12/24/2012   both eyes  . COLONOSCOPY    . COLONOSCOPY WITH PROPOFOL N/A 01/02/2019   Procedure: COLONOSCOPY WITH PROPOFOL;  Surgeon: Yetta Flock, MD;  Location: [facility];  Service: Gastroenterology;  Laterality: N/A;  . ESOPHAGOGASTRODUODENOSCOPY (EGD) WITH PROPOFOL N/A 01/02/2019   Procedure: ESOPHAGOGASTRODUODENOSCOPY (EGD) WITH PROPOFOL;  Surgeon: Yetta Flock, MD;  Location: [facility];  Service: Gastroenterology;  Laterality: N/A;  . NSVD     x1  . POLYPECTOMY  01/02/2019   Procedure: POLYPECTOMY;  Surgeon: Yetta Flock, MD;  Location: [facility] ENDOSCOPY;  Service: Gastroenterology;;  . POLYPECTOMY    . TONSILLECTOMY    . UPPER GASTROINTESTINAL ENDOSCOPY      FAMILY HISTORY: Family History  Problem Relation Age of Onset  . Lung cancer Mother 51  . Lung disease Father 9  . COPD Other        sibling  . Colon cancer Neg Hx   . Colon polyps Neg Hx   . Esophageal cancer Neg Hx   . Rectal cancer Neg Hx   . Stomach cancer Neg Hx     SOCIAL HISTORY: Social History   Socioeconomic History  . Marital status: Married     Spouse name: Jeneen Rinks  . Number of children: 2  . Years of education: 94  . Highest education level: High school graduate  Occupational History  . Occupation: retired Research scientist (physical sciences)  Tobacco Use  . Smoking status: Never Smoker  . Smokeless tobacco: Never Used  Vaping Use  . Vaping Use: Never used  Substance and Sexual Activity  . Alcohol use: No  . Drug use: No  . Sexual activity: Not Currently  Other Topics Concern  . Not on file  Social History Narrative   Lives at home with her husband.   Right-handed.   1-2 cups coffee daily, occasional tea or soda.   Social Determinants of Health   Financial Resource Strain: Low Risk   . Difficulty of Paying Living Expenses: Not hard at all  Food Insecurity: No Food Insecurity  .  Worried About Charity fundraiser in the Last Year: Never true  . Ran Out of Food in the Last Year: Never true  Transportation Needs: No Transportation Needs  . Lack of Transportation (Medical): No  . Lack of Transportation (Non-Medical): No  Physical Activity: Insufficiently Active  . Days of Exercise per Week: 7 days  . Minutes of Exercise per Session: 10 min  Stress: Stress Concern Present  . Feeling of Stress : To some extent  Social Connections: Moderately Isolated  . Frequency of Communication with Friends and Family: More than three times a week  . Frequency of Social Gatherings with Friends and Family: Never  . Attends Religious Services: Never  . Active Member of Clubs or Organizations: No  . Attends Archivist Meetings: Never  . Marital Status: Married  Human resources officer Violence:   . Fear of Current or Ex-Partner: Not on file  . Emotionally Abused: Not on file  . Physically Abused: Not on file  . Sexually Abused: Not on file     PHYSICAL EXAM   There were no vitals filed for this visit. Not recorded     There is no height or weight on file to calculate BMI.  PHYSICAL EXAMNIATION:  Gen: NAD, conversant, well nourised, well  groomed                     Cardiovascular: Regular rate rhythm, no peripheral edema, warm, nontender. Eyes: Conjunctivae clear without exudates or hemorrhage Neck: Supple, no carotid bruits. Pulmonary: Clear to auscultation bilaterally   NEUROLOGICAL EXAM:  MENTAL STATUS: Speech:    Speech is normal; fluent and spontaneous with normal comprehension.  Cognition:     Orientation to time, place and person     Normal recent and remote memory     Normal Attention span and concentration     Normal Language, naming, repeating,spontaneous speech     Fund of knowledge   CRANIAL NERVES: CN II: Visual fields are full to confrontation. Pupils are round equal and briskly reactive to light. CN III, IV, VI: extraocular movement are normal. No ptosis. CN V: Facial sensation is intact to light touch CN VII: Face is symmetric with normal eye closure  CN VIII: Hearing is normal to causal conversation. CN IX, X: Phonation is normal. CN XI: Head turning and shoulder shrug are intact  MOTOR: Significant bilateral lower extremity pitting edema, upper extremity motor strength is normal, lower extremity motor strength (R/L) Hip flexion 4+/4, knee flexion 4+/4, knee extension 4+/4, ankle dorsiflexion 2/1, plantarflexion 2/2  REFLEXES: Reflexes are 2+ and symmetric at the biceps, triceps, knees, and absent at ankles. Plantar responses are flexor.  SENSORY: Length dependent decreased light touch, pinprick, vibratory sensation to knee level COORDINATION: There is no trunk or limb dysmetria noted.  GAIT/STANCE: Deferred   DIAGNOSTIC DATA (LABS, IMAGING, TESTING) - I reviewed patient records, labs, notes, testing and imaging myself where available.   ASSESSMENT AND PLAN  Darcie DEETTA SIEGMANN is a 86 y.o. female   Progressive worsening gait abnormality,  Profound distal leg weakness, left worse than right, hyperreflexia on examination, patient also reported occasionally bowel and bladder  incontinence  Potential localized to cervical/thoracic/brain  Proceed with MRI of the brain, cervical, thoracic spine  EMG nerve conduction study   Get Medical Record from   Dr. Leigh Aurora Rheumatologist in [place] Address: [address] [place], [place] [address] Phone: [phone]  Dr. Harrie Jeans [facility]  [address] [place], [place] [address] [phone]  Dahari D. Rolena Infante, MD  ([id number])  Orthopedic surgeon [address] [place]  [phone]   Marcial Pacas, M.D. Ph.D.  [facility] [address] [place], [place] [address] Ph: [phone] Fax: [phone]  CC:  [address], [place],  [place] [address]

## 2020-05-08 NOTE — Telephone Encounter (Signed)
Please get MRI lumbar CD from her orthopedic surgeon Dr. Rolena Infante.

## 2020-05-09 NOTE — Procedures (Signed)
Full Name: Emily Cannon Gender: Female MRN #: [id number] Date of Birth: October 26, 1932    Visit Date: 05/08/2020 09:42 Age: 86 Years Examining Physician: Marcial Pacas, MD  Referring Physician: Marcial Pacas, MD; Suella Broad, MD Height: 5 feet 0 inch History: 86 year old female, presented with bilateral lower extremity weakness, gait abnormality.  Summary of the test:  Nerve conduction study:  Bilateral sural, superficial peroneal sensory responses were absent.  Left ulnar sensory response was within normal limit. Bilateral peroneal to EDB, left tibial motor responses were absent.  Left ulnar motor responses were within normal limits  Electromyography:  Selected needle examinations were performed at bilateral lower extremity muscles, bilateral lumbosacral paraspinal muscles, left upper extremity and left cervical paraspinal muscles.  There was evidence of chronic neuropathic changes involving bilateral L4-5 S1 myotomes.  There is also evidence of chronic neuropathic changes involving left T1 C8 C7  Conclusion: This is an abnormal study.  There is electrodiagnostic evidence of chronic bilateral lumbosacral radiculopathy, involving bilateral L4-5 S1 myotomes.  There is also evidence of chronic left cervical radiculopathy involving left C7 and C8-T1 myotomes.  In addition, there is evidence of length dependent sensorimotor polyneuropathy.    ------------------------------- Marcial Pacas, M.D. PhD  [facility] [address] [place], [place] [address] Tel: [phone] Fax: [phone]  Verbal informed consent was obtained from the patient, patient was informed of potential risk of procedure, including bruising, bleeding, hematoma formation, infection, muscle weakness, muscle pain, numbness, among others.        [place]    Nerve / Sites Muscle Latency Ref. Amplitude Ref. Rel Amp Segments Distance Velocity Ref. Area    ms ms mV mV %  cm m/s m/s mVms  L  Ulnar - ADM     Wrist ADM 2.8 ?3.3 9.6 ?6.0 100 Wrist - ADM 7   31.3     B.Elbow ADM 6.2  7.7  80.1 B.Elbow - Wrist 18 53 ?49 27.1     A.Elbow ADM 8.1  6.3  81.1 A.Elbow - B.Elbow 10 52 ?49 23.3  L Peroneal - EDB     Ankle EDB NR ?6.5 NR ?2.0 NR Ankle - EDB 9   NR     Fib head EDB NR  NR  NR Fib head - Ankle 23 NR ?44 NR  R Peroneal - EDB     Ankle EDB  ?6.5  ?2.0  Ankle - EDB 9        Fib head EDB NR  NR  NR Fib head - Ankle 24 NR ?44 NR  L Tibial - AH     Ankle AH NR ?5.8 NR ?4.0 NR Ankle - AH 9 NR  NR             SNC    Nerve / Sites Rec. Site Peak Lat Ref.  Amp Ref. Segments Distance    ms ms V V  cm  L Sural - Ankle (Calf)     Calf Ankle NR ?4.4 NR ?6 Calf - Ankle 14  R Sural - Ankle (Calf)     Calf Ankle NR ?4.4 NR ?6 Calf - Ankle 14  L Superficial peroneal - Ankle     Lat leg Ankle NR ?4.4 NR ?6 Lat leg - Ankle 14  R Superficial peroneal - Ankle     Lat leg Ankle NR ?4.4 NR ?6 Lat leg - Ankle 14  L Ulnar - Orthodromic, (Dig V, Mid palm)  Dig V Wrist 3.1 ?3.1 5 ?5 Dig V - Wrist 33               F  Wave    Nerve F Lat Ref.   ms ms  L Ulnar - ADM 27.8 ?32.0       EMG Summary Table    Spontaneous MUAP Recruitment  Muscle IA Fib PSW Fasc Other Amp Dur. Poly Pattern  L. Tibialis anterior Increased 1+ None None _______ Increased Increased 1+ Reduced  L. Tibialis posterior Increased 1+ None None _______ Increased Increased 1+ Reduced  L. Peroneus longus Increased 1+ 1+ None _______ Increased Increased 1+ Reduced  L. Vastus lateralis Normal None None None _______ Increased Increased 1+ Reduced  L. Gastrocnemius (Medial head) Increased None None None _______ Increased Increased 1+ Reduced  R. Tibialis anterior Increased None None None _______ Increased Increased 1+ Reduced  R. Tibialis posterior Increased None None None _______ Increased Increased 1+ Reduced  R. Gastrocnemius (Medial head) Increased None None None _______ Increased Increased 1+ Reduced  R. Peroneus  longus Increased None None None _______ Increased Increased 1+ Reduced  R. Vastus lateralis Normal None None None _______ Increased Increased 1+ Reduced  R. Lumbar paraspinals (low) Increased None None None _______ Normal Normal Normal Normal  R. Lumbar paraspinals (mid) Increased None None None _______ Normal Normal Normal Normal  L. Lumbar paraspinals (low) Increased None None None _______ Normal Normal Normal Normal  L. Lumbar paraspinals (mid) Increased None None None _______ Normal Normal Normal Normal  L. First dorsal interosseous Increased None None None _______ Increased Increased 1+ Reduced  L. Pronator teres Increased None None None _______ Normal Normal Normal Reduced  L. Biceps brachii Normal None None None _______ Increased Increased Normal Reduced  L. Deltoid Increased None None None _______ Increased Increased Normal Reduced  L. Triceps brachii Normal None None None _______ Normal Normal Normal Normal  L. Cervical paraspinals Increased None None None _______ Normal Normal Normal Reduced

## 2020-05-09 NOTE — Telephone Encounter (Signed)
Request made

## 2020-05-12 ENCOUNTER — Ambulatory Visit: Payer: Medicare Other | Admitting: Diagnostic Neuroimaging

## 2020-05-15 NOTE — Telephone Encounter (Signed)
[name] for me to see Emily Cannon again.  Also make her appoint at tertiary center soon, [facility] or [facility].

## 2020-05-15 NOTE — Telephone Encounter (Signed)
Dr. Krista Blue has not received the MRI disc yet. He is aware that we will request it again.   The patient's husband would like a call once it has been reviewed by Dr. Krista Blue.

## 2020-05-15 NOTE — Telephone Encounter (Signed)
[place],[name](husband on DPR) is asking for a call from [facility] re: pt coming in as a result of her worsening.

## 2020-05-15 NOTE — Telephone Encounter (Signed)
MRI disc and printed results provided to Dr. Krista Blue for review.  The patient's husband would like a call with her opinion on the findings. [phone].  She has an appt w/ [facility] on 08/07/2020. She is also on a cancellation list.

## 2020-05-17 ENCOUNTER — Encounter: Payer: Self-pay | Admitting: Family Medicine

## 2020-05-17 ENCOUNTER — Other Ambulatory Visit: Payer: Self-pay

## 2020-05-17 ENCOUNTER — Ambulatory Visit: Payer: Medicare Other | Admitting: Family Medicine

## 2020-05-17 VITALS — BP 104/58 | HR 63 | Ht 60.0 in | Wt 110.0 lb

## 2020-05-17 DIAGNOSIS — I10 Essential (primary) hypertension: Secondary | ICD-10-CM

## 2020-05-17 DIAGNOSIS — R21 Rash and other nonspecific skin eruption: Secondary | ICD-10-CM

## 2020-05-17 DIAGNOSIS — G6289 Other specified polyneuropathies: Secondary | ICD-10-CM

## 2020-05-17 DIAGNOSIS — Z23 Encounter for immunization: Secondary | ICD-10-CM | POA: Diagnosis not present

## 2020-05-17 MED ORDER — CLOTRIMAZOLE-BETAMETHASONE 1-0.05 % EX CREA: 1.0000 "application " | TOPICAL_CREAM | Freq: Two times a day (BID) | CUTANEOUS | 0 refills | Status: DC

## 2020-05-17 NOTE — Patient Instructions (Signed)
Try to avoid tight, occlusive clothing around the rash  Touch base in 1-2 weeks if rash no better.

## 2020-05-17 NOTE — Progress Notes (Signed)
Established Patient Office Visit  Subjective:  Patient ID: Emily Cannon, female    DOB: [birth year]  Age: 86 y.o. MRN: [id number]  CC:  Chief Complaint  Patient presents with  . Rash    hx of shingles , rash on  abd , started about 2 weeks, no fever , using otc     HPI Emily Cannon presents for rash lower abdominal region.  She has had prior history of shingles and was concerned that whether this is a shingles rash.  Her current rash started about 2 weeks ago.  This is pruritic and not painful.  This crosses midline.  Slightly erythematous and scaly.  Suprapubic region.  She does wear Pampers at night but takes these off during the day.  She is not aware of any persistent moisture in this region.  Her rash does not extend around to her backside.  She tried some type of over-the-counter medication and some Desitin without improvement.  She has chronic problems of hypertension, microscopic polyangiitis which was diagnosed last year, irritable bowel syndrome, hypothyroidism.  She presented here several months ago with left foot drop and this is been progressive and she now has some involving the right foot.  We referred her to neurology.  She is also been followed by orthopedic surgery and has had MRIs of the cervical, thoracic, and lumbar spine in recent months.  She had recent nerve conduction testing.  She has hyperreflexia.  Occasional bowel and urine incontinence.  She has been referred to the [facility] neuromuscular clinic  Electromyography revealed evidence for chronic bilateral lumbosacral radiculopathy involving bilateral L4-5 S1 myotomes.  She is trying to ambulate with a walker at home.  Husband is considering looking at wheelchair options.  Past Medical History:  Diagnosis Date  . Allergic rhinitis, cause unspecified   . Allergy   . Anxiety state, unspecified   . Blood transfusion without reported diagnosis    2020  . Cataract    removed both eyes   . Chronic kidney  disease   . Disorder of bone and cartilage, unspecified   . Esophageal candidiasis ([place])   . Foot drop   . Gastric ulcer   . History of CMV   . History of Helicobacter pylori infection   . Hypothyroid   . Irritable bowel syndrome   . Lumbago   . Microscopic polyangiitis ([place])   . Other and unspecified hyperlipidemia   . Perforation of tympanic membrane, unspecified   . Unspecified essential hypertension     Past Surgical History:  Procedure Laterality Date  . ABDOMINAL HYSTERECTOMY    . BIOPSY  01/02/2019   Procedure: BIOPSY;  Surgeon: Yetta Flock, MD;  Location: [facility] ENDOSCOPY;  Service: Gastroenterology;;  . cataract surgery  5/12 and 12/24/2012   both eyes  . COLONOSCOPY    . COLONOSCOPY WITH PROPOFOL N/A 01/02/2019   Procedure: COLONOSCOPY WITH PROPOFOL;  Surgeon: Yetta Flock, MD;  Location: [place];  Service: Gastroenterology;  Laterality: N/A;  . ESOPHAGOGASTRODUODENOSCOPY (EGD) WITH PROPOFOL N/A 01/02/2019   Procedure: ESOPHAGOGASTRODUODENOSCOPY (EGD) WITH PROPOFOL;  Surgeon: Yetta Flock, MD;  Location: [place];  Service: Gastroenterology;  Laterality: N/A;  . NSVD     x1  . POLYPECTOMY  01/02/2019   Procedure: POLYPECTOMY;  Surgeon: Yetta Flock, MD;  Location: [facility] ENDOSCOPY;  Service: Gastroenterology;;  . POLYPECTOMY    . TONSILLECTOMY    . UPPER GASTROINTESTINAL ENDOSCOPY      Family History  Problem  Relation Age of Onset  . Lung cancer Mother 30  . Lung disease Father 39  . COPD Other        sibling  . Colon cancer Neg Hx   . Colon polyps Neg Hx   . Esophageal cancer Neg Hx   . Rectal cancer Neg Hx   . Stomach cancer Neg Hx     Social History   Socioeconomic History  . Marital status: Married    Spouse name: Jeneen Rinks  . Number of children: 2  . Years of education: 80  . Highest education level: High school graduate  Occupational History  . Occupation: retired Research scientist (physical sciences)  Tobacco Use  . Smoking status: Never  Smoker  . Smokeless tobacco: Never Used  Vaping Use  . Vaping Use: Never used  Substance and Sexual Activity  . Alcohol use: No  . Drug use: No  . Sexual activity: Not Currently  Other Topics Concern  . Not on file  Social History Narrative   Lives at home with her husband.   Right-handed.   1-2 cups coffee daily, occasional tea or soda.   Social Determinants of Health   Financial Resource Strain: Low Risk   . Difficulty of Paying Living Expenses: Not hard at all  Food Insecurity: No Food Insecurity  . Worried About Charity fundraiser in the Last Year: Never true  . Ran Out of Food in the Last Year: Never true  Transportation Needs: No Transportation Needs  . Lack of Transportation (Medical): No  . Lack of Transportation (Non-Medical): No  Physical Activity: Insufficiently Active  . Days of Exercise per Week: 7 days  . Minutes of Exercise per Session: 10 min  Stress: Stress Concern Present  . Feeling of Stress : To some extent  Social Connections: Moderately Isolated  . Frequency of Communication with Friends and Family: More than three times a week  . Frequency of Social Gatherings with Friends and Family: Never  . Attends Religious Services: Never  . Active Member of Clubs or Organizations: No  . Attends Archivist Meetings: Never  . Marital Status: Married  Human resources officer Violence:   . Fear of Current or Ex-Partner: Not on file  . Emotionally Abused: Not on file  . Physically Abused: Not on file  . Sexually Abused: Not on file    Outpatient Medications Prior to Visit  Medication Sig Dispense Refill  . amLODipine (NORVASC) 10 MG tablet TAKE ONE TABLET BY MOUTH DAILY 90 tablet 0  . atorvastatin (LIPITOR) 20 MG tablet TAKE ONE TABLET BY MOUTH DAILY 90 tablet 0  . calcitRIOL (ROCALTROL) 0.25 MCG capsule Take 0.25 mcg by mouth every Monday, Wednesday, and Friday.     . esomeprazole (NEXIUM) 40 MG packet TAKE ONE PACKET BY MOUTH ONCE TO TWICE A DAY AS NEEDED  BEFORE MEALS TO MANAGE SYMPTOMS 180 each 1  . furosemide (LASIX) 40 MG tablet Take 0.5 tablets (20 mg total) by mouth 3 (three) times a week. Monday, Wed, Friday (Patient taking differently: Take 40 mg by mouth every Monday, Wednesday, and Friday. ) 30 tablet   . gabapentin (NEURONTIN) 100 MG capsule Take 2 capsules by mouth at night for neuropathy pain 180 capsule 3  . hydrALAZINE (APRESOLINE) 50 MG tablet TAKE ONE TABLET BY MOUTH THREE TIMES A DAY 270 tablet 0  . levothyroxine (SYNTHROID) 50 MCG tablet TAKE ONE TABLET BY MOUTH EVERY MORNING BEFORE BREAKFAST 90 tablet 1  . metoprolol succinate (TOPROL-XL) 50 MG 24 hr  tablet TAKE ONE TABLET BY MOUTH TWICE A DAY 180 tablet 0  . metoprolol succinate (TOPROL-XL) 50 MG 24 hr tablet Take 1 tablet (50 mg total) by mouth 2 (two) times daily. Take with or immediately following a meal. 180 tablet 0  . Multiple Vitamins-Minerals (CENTRUM SILVER 50+WOMEN) TABS Take 1 tablet by mouth daily with breakfast.    . ondansetron (ZOFRAN ODT) 4 MG disintegrating tablet Take 1 tablet (4 mg total) by mouth every 8 (eight) hours as needed for nausea or vomiting. 20 tablet 0  . traMADol (ULTRAM) 50 MG tablet TAKE ONE TABLET BY MOUTH EVERY 6 HOURS AS NEEDED FOR UP TO 5 DAYS 30 tablet 0  . valACYclovir (VALTREX) 1000 MG tablet Take 1,000 mg by mouth 3 (three) times daily. Started 03/27/20 - taking for 7 days.     No facility-administered medications prior to visit.    Allergies  Allergen Reactions  . Alendronate Sodium Shortness Of Breath  . Amoxicillin Diarrhea    Severe diarrhea  . Codeine Nausea Only  . Valsartan Swelling    Gums/throat swell    ROS Review of Systems  Constitutional: Negative for chills, fatigue, fever and unexpected weight change.  Eyes: Negative for visual disturbance.  Respiratory: Negative for cough, chest tightness, shortness of breath and wheezing.   Cardiovascular: Negative for chest pain, palpitations and leg swelling.  Neurological:  Positive for weakness and numbness. Negative for dizziness, seizures, syncope, light-headedness and headaches.      Objective:    Physical Exam Vitals reviewed.  Constitutional:      Appearance: Normal appearance.  Cardiovascular:     Rate and Rhythm: Normal rate and regular rhythm.  Pulmonary:     Effort: Pulmonary effort is normal.     Breath sounds: Normal breath sounds.  Skin:    Findings: Rash present.     Comments: Patient has a fairly diffuse rash suprapubic region.  This is erythematous base and slightly scaly surface.  No vesicles.  No pustules.  No groin involvement  Neurological:     Mental Status: She is alert.     BP (!) 104/58 (BP Location: Left Arm, Patient Position: Sitting, Cuff Size: Normal)   Pulse 63   Ht 5' (1.524 m)   Wt 110 lb (49.9 kg)   SpO2 97%   BMI 21.48 kg/m  Wt Readings from Last 3 Encounters:  05/17/20 110 lb (49.9 kg)  02/23/20 120 lb 11.2 oz (54.7 kg)  01/17/20 129 lb 1.6 oz (58.6 kg)     Health Maintenance Due  Topic Date Due  . TETANUS/TDAP  07/28/2019  . INFLUENZA VACCINE  02/27/2020    There are no preventive care reminders to display for this patient.  Lab Results  Component Value Date   TSH 2.35 01/17/2020   Lab Results  Component Value Date   WBC 14.5 (H) 03/24/2020   HGB 12.3 03/24/2020   HCT 36.5 03/24/2020   MCV 90.1 03/24/2020   PLT 279 03/24/2020   Lab Results  Component Value Date   NA 138 03/24/2020   K 3.9 03/24/2020   CO2 30 03/24/2020   GLUCOSE 102 (H) 03/24/2020   BUN 22 03/24/2020   CREATININE 1.43 (H) 03/24/2020   BILITOT 0.5 03/24/2020   ALKPHOS 101 01/17/2020   AST 16 03/24/2020   ALT 16 03/24/2020   PROT 5.9 (L) 03/24/2020   ALBUMIN 4.3 01/17/2020   CALCIUM 9.5 03/24/2020   ANIONGAP 7 01/03/2019   GFR 32.78 (L) 01/17/2020  Lab Results  Component Value Date   CHOL 148 01/17/2020   Lab Results  Component Value Date   HDL 31.30 (L) 01/17/2020   Lab Results  Component Value Date    LDLCALC 65 09/11/2018   Lab Results  Component Value Date   TRIG (H) 01/17/2020    533.0 Triglyceride is over 400; calculations on Lipids are invalid.   Lab Results  Component Value Date   CHOLHDL 5 01/17/2020   Lab Results  Component Value Date   HGBA1C 6.4 01/23/2009      Assessment & Plan:   #1 pruritic skin rash suprapubic area.  Question irritation from recent Pampers at night.  We explained this could be fungal but distribution seems odd with mostly suprapubic and sparing of groin  -Try to keep area as dry as possible. -Consider trial of Lotrisone twice daily and be in touch if this is not improving in 2 weeks  #2 hypertension-stable -Continue amlodipine, hydralazine, metoprolol  #3 polyneuropathy involving lower extremities.  Work-up in progress  Meds ordered this encounter  Medications  . clotrimazole-betamethasone (LOTRISONE) cream    Sig: Apply 1 application topically 2 (two) times daily.    Dispense:  30 g    Refill:  0    Follow-up: No follow-ups on file.    Carolann Littler, MD

## 2020-05-19 ENCOUNTER — Other Ambulatory Visit: Payer: Self-pay | Admitting: Family Medicine

## 2020-05-25 ENCOUNTER — Other Ambulatory Visit: Payer: Self-pay | Admitting: Family Medicine

## 2020-06-13 ENCOUNTER — Telehealth: Payer: Self-pay | Admitting: Gastroenterology

## 2020-06-14 ENCOUNTER — Other Ambulatory Visit: Payer: Self-pay

## 2020-06-14 MED ORDER — ESOMEPRAZOLE MAGNESIUM 40 MG PO CPDR: 40.0000 mg | DELAYED_RELEASE_CAPSULE | Freq: Every day | ORAL | 1 refills | Status: DC

## 2020-06-14 NOTE — Progress Notes (Signed)
Sent script for esomeprazole capsule.  Pt needs OV for further refills.

## 2020-06-14 NOTE — Telephone Encounter (Signed)
Sent script for esomeprazole capsule.  Pt needs OV for further refills.

## 2020-07-05 ENCOUNTER — Other Ambulatory Visit: Payer: Self-pay | Admitting: Family Medicine

## 2020-07-06 NOTE — Telephone Encounter (Signed)
Last OV 05/17/20 Last refill 02/01/20 #90/1 Next OV not scheduled

## 2020-07-10 ENCOUNTER — Telehealth: Payer: Self-pay | Admitting: Neurology

## 2020-07-10 DIAGNOSIS — R269 Unspecified abnormalities of gait and mobility: Secondary | ICD-10-CM

## 2020-07-10 NOTE — Telephone Encounter (Signed)
Pt's husband, Adrina Armijo (on [place]) called, would like to discuss an the appt scheduled in January. My wife condition has deteiorated; She can hardly walk and I can't carry her. Would like a call from the nurse.

## 2020-07-10 NOTE — Telephone Encounter (Signed)
Orders Placed This Encounter  Procedures   Ambulatory referral to Home Health    

## 2020-07-10 NOTE — Telephone Encounter (Signed)
I can see her to see if there is anything we can offer.  Even if after she was evaluated by [facility] Neuromuscular clinic, has a more clear diagnosis, her functional status would not approve suddenly.  The best options are, 1. Home Health 2. At home caregiver  3. Or Placement for safety.

## 2020-07-10 NOTE — Telephone Encounter (Signed)
I spoke to the patient's husband. He would appreciate an order for home health to come assess for needs. Order will be placed in Epic today.  He is still very eager for her [facility] appt to be moved to an earlier date. He would like a follow up call once they have been contacted.

## 2020-07-10 NOTE — Telephone Encounter (Signed)
Noted Working on keep you updated . Thanks Hinton Dyer

## 2020-07-10 NOTE — Addendum Note (Signed)
Addended by: Marcial Pacas on: 07/10/2020 02:39 PM   Modules accepted: Orders

## 2020-07-10 NOTE — Telephone Encounter (Signed)
I returned the call to Mr. Peddie who reports his wife's condition has taken a major decline. States her right foot drop is more significant and it also turning inward now. Her weakness is worse. He is elderly too and having difficulty with transfers and keeping them both safe. She has a pending appt at [facility] neuromuscular clinic on 08/07/20. He does not feel she can wait that long. They have been on a cancellation list. He is asking for Dr. Krista Blue to call and complete a peer discussion with the clinic. He feels like her current state of health makes her more urgent.  He would like a follow up call today. States he is very concerned.

## 2020-07-11 ENCOUNTER — Ambulatory Visit: Payer: Self-pay | Admitting: Neurology

## 2020-07-11 NOTE — Telephone Encounter (Signed)
Called and spoke to patient's Husband he is going to call me back He is going to talk to his wife and call me direct. Sharyn Lull I will keep you updated.  CX apt for today .

## 2020-07-11 NOTE — Telephone Encounter (Addendum)
The patient has been scheduled for a telephone visit today at 1pm with Dr. Krista Blue.  The best number to call is [phone].

## 2020-07-11 NOTE — Telephone Encounter (Signed)
[facility] and they don't have anything any sooner patient is on CX list I tried everything I could . I will Be glad to also to send Referral to [facility] and [facility] ?   Home Health Referral can you schedule patient a video visit so insurance will cover Home health last visit was October . Has to be video Visit ? Please advise Thanks Hinton Dyer I can Probably [place] start as early as next week .

## 2020-07-11 NOTE — Telephone Encounter (Signed)
Per [place] will need either an in-person office visit or video visit for services to be covered by the insurance plan. Hinton Dyer spoke to him and he is unable to set up her mychart account. He is unsure if he is going to bring her to the office. He is going to speak with his wife and call Hinton Dyer back.

## 2020-07-12 NOTE — Telephone Encounter (Signed)
Hi Emily Cannon Patient's Husband called me back he relayed he can bring his wife in . [phone].

## 2020-07-12 NOTE — Telephone Encounter (Signed)
I called her husband back.  I offered a follow up on 07/13/20 at 9:45am or 10:45am with Judson Roch. Unfortunately, she has another appointment and will not be able to make either time.  She has been scheduled with Dr. Krista Blue on 07/18/20 at 10:30am. She will be here early for check-in.

## 2020-07-18 ENCOUNTER — Telehealth: Payer: Self-pay | Admitting: Neurology

## 2020-07-18 ENCOUNTER — Encounter: Payer: Self-pay | Admitting: Neurology

## 2020-07-18 ENCOUNTER — Other Ambulatory Visit: Payer: Self-pay

## 2020-07-18 ENCOUNTER — Ambulatory Visit: Payer: Medicare Other | Admitting: Neurology

## 2020-07-18 VITALS — BP 116/68 | HR 99

## 2020-07-18 DIAGNOSIS — R531 Weakness: Secondary | ICD-10-CM

## 2020-07-18 DIAGNOSIS — G6289 Other specified polyneuropathies: Secondary | ICD-10-CM | POA: Diagnosis not present

## 2020-07-18 DIAGNOSIS — R269 Unspecified abnormalities of gait and mobility: Secondary | ICD-10-CM | POA: Diagnosis not present

## 2020-07-18 DIAGNOSIS — J849 Interstitial pulmonary disease, unspecified: Secondary | ICD-10-CM

## 2020-07-18 NOTE — Progress Notes (Addendum)
Chief Complaint  Patient presents with  . Follow-up    She is here with her husband, [name], for home health evaluation.     HISTORICAL  Emily Cannon is a 86 year old female seen in request by orthopedic surgeon Dr. Suella Broad and her primary care physician Dr. Carolann Littler for evaluation of gait abnormality, bilateral feet weakness, initial evaluation was with her husband on March 28, 2020  I reviewed and summarized the referring note. 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 Amil Amen receiving IV infusion every 6 months, patient does not know the name of medication, I assumed it was rituximab,  She also had kidney involvement, was treated by kidney physician Dr. 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 at the beginning of 2021, then she had significant worsening since June 2021,  She started to have left leg numbness extending from top of left foot to lateral leg, left leg weakness, began to rely on her walker, continue to progress over the past few months, now also developed right foot numbness, weakness, even with walker, she has significant difficulty,  She also noted to have worsening memory loss, has occasionally bowel and bladder incontinence, she denies visual loss, no dysarthria, no dysphagia  UPDATE May 08 2020:  MRI of brain on Sept 27 2021 showed mild generalized atrophy, no acute abnormalities. MRI of cervical spine showed multi-level degenerative changes, C5-6, C6-7 moderate canal stenosis. MRI of thoracic spine: widespread thoracic spine  degeneration,  Mild spinal stenosis, Right thyroid lobe nodule 2.3 cm.  Laboratory evaluation showed elevated WBC 14.5, Hg 12.3, CMP creat 1.43, TSH 2.35, triglyceride 533  EMG nerve conduction study today showed evidence of length dependent severe sensorimotor polyneuropathy, there is also evidence of chronic neuropathic changes involving left cervical myotomes, T1, C8 and bilateral lumbar sacral myotomes,  UPDATE Jul 18 2020: She is accompanied by her husband for an earlier appointment for progressive worsening bilateral distal leg weakness, gait abnormalities, increased difficulty to be taking care of at home  She denies double vision, no dysarthria, swallowing difficulty, denies upper extremity weakness, denies significant lower extremity sensory loss, mild low back pain, constipation, but denies incontinence  We personally reviewed MRI of lumbar spine from [facility] in August 2021, multilevel degenerative changes, 2 3, moderate central, moderate right foraminal stenosis, most noticeable at L3-4, with mild to moderate foraminal narrowing, no significant canal stenosis,  There was also reported mild T cystic partially demonstrated changes of pancreatic head, suggest for pancreatic head neoplasm, versus benign cystic change  This led to CT of abdomen with without contrast on April 27, 2020, 2 multilobulated pancreatic cyst measuring up to 3.1 cm in pancreatic head, no high risk imaging features,  EMG nerve conduction study in October 2021, showed severe sensorimotor polyneuropathy, also chronic neuropathic changes involving bilateral lumbar sacral myotomes, and the left cervical myotomes.    She had left ankle brace in October 2021, helped her left foot drop some, but she noticed progressive right ankle weakness, increased gait abnormality.  Has appointment pending with [facility] neuromuscular specialist on August 08, 2019   REVIEW OF SYSTEMS: Full 14  system review of systems performed  and notable only for as above All other review of systems were negative.  ALLERGIES: Allergies  Allergen Reactions  . Alendronate Sodium Shortness Of Breath  . Amoxicillin Diarrhea    Severe diarrhea  . Codeine Nausea Only  . Valsartan Swelling    Gums/throat swell    HOME MEDICATIONS: Current Outpatient Medications  Medication Sig Dispense Refill  . amLODipine (NORVASC) 10 MG tablet TAKE ONE TABLET BY MOUTH DAILY 90 tablet 0  . atorvastatin (LIPITOR) 20 MG tablet TAKE ONE TABLET BY MOUTH DAILY 90 tablet 0  . calcitRIOL (ROCALTROL) 0.25 MCG capsule Take 0.25 mcg by mouth every Monday, Wednesday, and Friday.     . clotrimazole-betamethasone (LOTRISONE) cream Apply 1 application topically 2 (two) times daily. 30 g 0  . esomeprazole (NEXIUM) 40 MG capsule Take 1 capsule (40 mg total) by mouth daily. You are due for a yearly office visit.  Please call to schedule: [phone] 30 capsule 1  . furosemide (LASIX) 40 MG tablet Take 0.5 tablets (20 mg total) by mouth 3 (three) times a week. Monday, Wed, Friday (Patient taking differently: Take 40 mg by mouth every Monday, Wednesday, and Friday.) 30 tablet   . gabapentin (NEURONTIN) 100 MG capsule Take 2 capsules by mouth at night for neuropathy pain 180 capsule 3  . hydrALAZINE (APRESOLINE) 50 MG tablet TAKE ONE TABLET BY MOUTH THREE TIMES A DAY 270 tablet 0  . levothyroxine (SYNTHROID) 50 MCG tablet TAKE ONE TABLET BY MOUTH EVERY MORNING BEFORE BREAKFAST 90 tablet 1  . metoprolol succinate (TOPROL-XL) 50 MG 24 hr tablet TAKE ONE TABLET BY MOUTH TWICE A DAY 180 tablet 0  . metoprolol succinate (TOPROL-XL) 50 MG 24 hr tablet Take 1 tablet (50 mg total) by mouth 2 (two) times daily. Take with or immediately following a meal. 180 tablet 0  . Multiple Vitamins-Minerals (CENTRUM SILVER 50+WOMEN) TABS Take 1 tablet by mouth daily with breakfast.    . ondansetron (ZOFRAN ODT) 4 MG disintegrating tablet Take 1 tablet (4 mg total) by mouth every 8  (eight) hours as needed for nausea or vomiting. 20 tablet 0  . traMADol (ULTRAM) 50 MG tablet TAKE ONE TABLET BY MOUTH EVERY 6 HOURS AS NEEDED FOR UP TO 5 DAYS 30 tablet 0  . valACYclovir (VALTREX) 1000 MG tablet Take 1,000 mg by mouth 3 (three) times daily. Started 03/27/20 - taking for 7 days.     No current facility-administered medications for this visit.    PAST MEDICAL HISTORY: Past Medical History:  Diagnosis Date  . Allergic rhinitis, cause unspecified   . Allergy   . Anxiety state, unspecified   . Blood transfusion without reported diagnosis    2020  . Cataract    removed both eyes   . Chronic kidney disease   . Disorder of bone and cartilage, unspecified   . Esophageal candidiasis ([place])   . Foot drop   . Gastric ulcer   . History of CMV   . History of Helicobacter pylori infection   . Hypothyroid   . Irritable bowel syndrome   . Lumbago   . Microscopic polyangiitis ([place])   . Other and unspecified hyperlipidemia   . Perforation of tympanic membrane, unspecified   . Unspecified essential hypertension     PAST SURGICAL HISTORY: Past Surgical History:  Procedure Laterality Date  . ABDOMINAL HYSTERECTOMY    . BIOPSY  01/02/2019   Procedure: BIOPSY;  Surgeon: Yetta Flock, MD;  Location: [facility]  ENDOSCOPY;  Service: Gastroenterology;;  . cataract surgery  5/12 and 12/24/2012   both eyes  . COLONOSCOPY    . COLONOSCOPY WITH PROPOFOL N/A 01/02/2019   Procedure: COLONOSCOPY WITH PROPOFOL;  Surgeon: Yetta Flock, MD;  Location: [place];  Service: Gastroenterology;  Laterality: N/A;  . ESOPHAGOGASTRODUODENOSCOPY (EGD) WITH PROPOFOL N/A 01/02/2019   Procedure: ESOPHAGOGASTRODUODENOSCOPY (EGD) WITH PROPOFOL;  Surgeon: Yetta Flock, MD;  Location: [place];  Service: Gastroenterology;  Laterality: N/A;  . NSVD     x1  . POLYPECTOMY  01/02/2019   Procedure: POLYPECTOMY;  Surgeon: Yetta Flock, MD;  Location: [facility] ENDOSCOPY;  Service:  Gastroenterology;;  . POLYPECTOMY    . TONSILLECTOMY    . UPPER GASTROINTESTINAL ENDOSCOPY      FAMILY HISTORY: Family History  Problem Relation Age of Onset  . Lung cancer Mother 69  . Lung disease Father 9  . COPD Other        sibling  . Colon cancer Neg Hx   . Colon polyps Neg Hx   . Esophageal cancer Neg Hx   . Rectal cancer Neg Hx   . Stomach cancer Neg Hx     SOCIAL HISTORY: Social History   Socioeconomic History  . Marital status: Married    Spouse name: Jeneen Rinks  . Number of children: 2  . Years of education: 15  . Highest education level: High school graduate  Occupational History  . Occupation: retired Research scientist (physical sciences)  Tobacco Use  . Smoking status: Never Smoker  . Smokeless tobacco: Never Used  Vaping Use  . Vaping Use: Never used  Substance and Sexual Activity  . Alcohol use: No  . Drug use: No  . Sexual activity: Not Currently  Other Topics Concern  . Not on file  Social History Narrative   Lives at home with her husband.   Right-handed.   1-2 cups coffee daily, occasional tea or soda.   Social Determinants of Health   Financial Resource Strain: Low Risk   . Difficulty of Paying Living Expenses: Not hard at all  Food Insecurity: No Food Insecurity  . Worried About Charity fundraiser in the Last Year: Never true  . Ran Out of Food in the Last Year: Never true  Transportation Needs: No Transportation Needs  . Lack of Transportation (Medical): No  . Lack of Transportation (Non-Medical): No  Physical Activity: Insufficiently Active  . Days of Exercise per Week: 7 days  . Minutes of Exercise per Session: 10 min  Stress: Stress Concern Present  . Feeling of Stress : To some extent  Social Connections: Moderately Isolated  . Frequency of Communication with Friends and Family: More than three times a week  . Frequency of Social Gatherings with Friends and Family: Never  . Attends Religious Services: Never  . Active Member of Clubs or Organizations:  No  . Attends Archivist Meetings: Never  . Marital Status: Married  Human resources officer Violence: Not on file     PHYSICAL EXAM  PHYSICAL EXAMNIATION:  Gen: NAD, conversant, well nourised, well groomed                     Cardiovascular: Regular rate rhythm, no peripheral edema, warm, nontender. Eyes: Conjunctivae clear without exudates or hemorrhage Neck: Supple, no carotid bruits. Pulmonary: Clear to auscultation bilaterally   NEUROLOGICAL EXAM:  MENTAL STATUS: Speech/cognition Awake, alert, oriented to history taking care of conversation   CRANIAL NERVES: CN II: Visual fields are  full to confrontation. Pupils are round equal and briskly reactive to light. CN III, IV, VI: extraocular movement are normal. No ptosis. CN V: Facial sensation is intact to light touch CN VII: Face is symmetric with normal eye closure  CN VIII: Hearing is normal to causal conversation. CN IX, X: Phonation is normal. CN XI: Head turning and shoulder shrug are intact  MOTOR: Significant bilateral lower extremity pitting edema, upper extremity motor strength is normal, lower extremity motor strength (R/L) Hip flexion 4+/4, knee flexion 4/4, knee extension 4+/4, ankle dorsiflexion 2/1, plantarflexion 3/3  REFLEXES: Reflexes are 3 and symmetric at the biceps, triceps, 3/3 knees, and absent at ankles. Plantar responses are extensor bilaterally  SENSORY: Decreased vibratory sensation in toes, mildly length dependent decreased pinprick to ankle  COORDINATION: There is no trunk or limb dysmetria noted.  GAIT/STANCE: Need assistant to get up from seated position, bilateral foot drop  DIAGNOSTIC DATA (LABS, IMAGING, TESTING) - I reviewed patient records, labs, notes, testing and imaging myself where available.   ASSESSMENT AND PLAN  Kelsy MARCIA HARTWELL is a 86 y.o. female   Progressive worsening gait abnormality,  Profound distal leg weakness, left worse than right, hyperreflexia on  examination,  MRI of neural axis failed to demonstrate etiology.  Laboratory evaluations for potential treatable etiology including nutritional deficiency, paraneoplastic panel, inflammatory markers  CT of the chest  Differentiation diagnosis including sensorimotor polyneuropathy, could not rule out the possibility of motor neuron disease  Has appointment with [facility] neuromuscular specialist on August 07, 2020.  Referral to home health  Total time spent reviewing the chart, obtaining history, examined patient, ordering tests, documentation, consultations and family, care coordination was 50 minutes    Marcial Pacas, M.D. Ph.D.  [facility] [place], [place] [address] Phone: [phone] Fax:      [phone]

## 2020-07-18 NOTE — Telephone Encounter (Signed)
[employer] medicare order sent to GI. No auth they will reach out to the patient to schedule.  

## 2020-07-19 ENCOUNTER — Telehealth: Payer: Self-pay | Admitting: *Deleted

## 2020-07-19 NOTE — Telephone Encounter (Signed)
error 

## 2020-07-24 ENCOUNTER — Ambulatory Visit
Admission: RE | Admit: 2020-07-24 | Discharge: 2020-07-24 | Disposition: A | Discharge status: Home / Self Care | Payer: Medicare Other | Source: Ambulatory Visit | Attending: Neurology | Admitting: Neurology

## 2020-07-24 ENCOUNTER — Other Ambulatory Visit: Payer: Self-pay

## 2020-07-24 DIAGNOSIS — J849 Interstitial pulmonary disease, unspecified: Secondary | ICD-10-CM

## 2020-07-26 ENCOUNTER — Telehealth: Payer: Self-pay | Admitting: *Deleted

## 2020-07-26 DIAGNOSIS — J984 Other disorders of lung: Secondary | ICD-10-CM

## 2020-07-26 DIAGNOSIS — R911 Solitary pulmonary nodule: Secondary | ICD-10-CM

## 2020-07-26 LAB — VITAMIN B12: Vitamin B-12: 875 pg/mL (ref 232–1245)

## 2020-07-26 LAB — MULTIPLE MYELOMA PANEL, SERUM
Albumin SerPl Elph-Mcnc: 4.3 g/dL (ref 2.9–4.4)
Albumin/Glob SerPl: 1.8 — ABNORMAL HIGH (ref 0.7–1.7)
Alpha 1: 0.4 g/dL (ref 0.0–0.4)
Alpha2 Glob SerPl Elph-Mcnc: 0.9 g/dL (ref 0.4–1.0)
B-Globulin SerPl Elph-Mcnc: 0.9 g/dL (ref 0.7–1.3)
Gamma Glob SerPl Elph-Mcnc: 0.4 g/dL (ref 0.4–1.8)
Globulin, Total: 2.5 g/dL (ref 2.2–3.9)
IgA/Immunoglobulin A, Serum: 60 mg/dL — ABNORMAL LOW (ref 64–422)
IgG (Immunoglobin G), Serum: 359 mg/dL — ABNORMAL LOW (ref 586–1602)
IgM (Immunoglobulin M), Srm: 33 mg/dL (ref 26–217)
Total Protein: 6.8 g/dL (ref 6.0–8.5)

## 2020-07-26 LAB — COPPER, SERUM: Copper: 136 ug/dL (ref 80–158)

## 2020-07-26 LAB — PARANEOPLASTIC AUTOAB EVAL
AchR Ganglionic Neuronal Ab,S: 0 nmol/L (ref <=0.02)
Amphiphysin Ab, S: NEGATIVE titer
Anti-Glial Nuclear Ab Type 1: NEGATIVE titer
Anti-Neuronal Nucl. Ab, Type 1: NEGATIVE titer
Anti-Neuronal Nucl. Ab, Type 2: NEGATIVE titer
Anti-Neuronal Nucl. Ab, Type 3: NEGATIVE titer
CRMP-5 IgG, S: NEGATIVE titer
Calcium Channel, Ab P/Q-Type: 0 nmol/L (ref <=0.02)
Neuronal (V + G) K+ Channel Ab,S: 0 nmol/L (ref <=0.02)
Purkinje Cell Cytop.Ab Type Tr: NEGATIVE titer
Purkinje Cell Cytop.Ab, Type 2: NEGATIVE titer
Purkinje Cell Cytop.Ab,Type 1: NEGATIVE titer

## 2020-07-26 LAB — RPR: RPR Ser Ql: NONREACTIVE

## 2020-07-26 LAB — ACETYLCHOLINE RECEPTOR, BINDING: AChR Binding Ab, Serum: <0.03 nmol/L (ref 0.00–0.24)

## 2020-07-26 LAB — ANA W/REFLEX IF POSITIVE: Anti Nuclear Antibody (ANA): NEGATIVE

## 2020-07-26 LAB — VITAMIN D 25 HYDROXY (VIT D DEFICIENCY, FRACTURES): Vit D, 25-Hydroxy: 50.3 ng/mL (ref 30.0–100.0)

## 2020-07-26 LAB — TSH: TSH: 3.39 u[IU]/mL (ref 0.450–4.500)

## 2020-07-26 LAB — CK: Total CK: 200 U/L — ABNORMAL HIGH (ref 26–161)

## 2020-07-26 LAB — HGB A1C W/O EAG: Hgb A1c MFr Bld: 5.7 % — ABNORMAL HIGH (ref 4.8–5.6)

## 2020-07-26 LAB — FERRITIN: Ferritin: 556 ng/mL — ABNORMAL HIGH (ref 15–150)

## 2020-07-26 LAB — SEDIMENTATION RATE: Sed Rate: 2 mm/hr (ref 0–40)

## 2020-07-26 LAB — HIV ANTIBODY (ROUTINE TESTING W REFLEX): HIV Screen 4th Generation wRfx: NONREACTIVE

## 2020-07-26 NOTE — Progress Notes (Signed)
Please call patient and advise her that she has 2 new lung nodules, compared to findings from September 2020.  Scarring was seen as well.  If she has a lung specialist/pulmonologist, I would recommend a follow-up.

## 2020-07-26 NOTE — Telephone Encounter (Signed)
Please go ahead and place a referral to pulmonology for lung nodules and scarring.

## 2020-07-26 NOTE — Telephone Encounter (Signed)
-----   Message from Star Age, MD sent at 07/26/2020  8:25 AM EST ----- Please call patient and advise her that her blood work was for the most part normal with a few minor abnormalities noted.  Hemoglobin A1c which is a marker for diabetes was in the prediabetes range.  Is actually better than compared to the past.  Her muscle enzyme called CK was mildly elevated.  There were some abnormalities in her protein breakdown of her blood but in my opinion not significant enough to warrant evaluation with a hematologist.  Please advise patient that Dr. Krista Blue may make further recommendations upon her return.

## 2020-07-26 NOTE — Telephone Encounter (Signed)
I spoke to the patient's husband on DPR. He verbalized understanding of the CT chest findings. She does not have an established pulmonologist. He is asking if our office will place the referral for her.

## 2020-07-26 NOTE — Telephone Encounter (Addendum)
I spoke to her husband, Renella Steig. He verbalized understanding of the findings. He is aware that we will call him back if Dr. Krista Blue has further recommendations.  Also, he is aware of her abnormal CT Chest (separate note in Epic). Dr. Rexene Alberts authorized referral to pulmonology.

## 2020-07-26 NOTE — Telephone Encounter (Signed)
-----   Message from Star Age, MD sent at 07/26/2020  8:21 AM EST ----- Please call patient and advise her that she has 2 new lung nodules, compared to findings from September 2020.  Scarring was seen as well.  If she has a lung specialist/pulmonologist, I would recommend a follow-up.

## 2020-07-26 NOTE — Telephone Encounter (Signed)
Referral placed. Patient's husband aware to expect a call for scheduling.

## 2020-07-26 NOTE — Progress Notes (Signed)
Please call patient and advise her that her blood work was for the most part normal with a few minor abnormalities noted.  Hemoglobin A1c which is a marker for diabetes was in the prediabetes range.  Is actually better than compared to the past.  Her muscle enzyme called CK was mildly elevated.  There were some abnormalities in her protein breakdown of her blood but in my opinion not significant enough to warrant evaluation with a hematologist.  Please advise patient that Dr. Krista Blue may make further recommendations upon her return.

## 2020-07-26 NOTE — Addendum Note (Signed)
Addended by: Noberto Retort C on: 07/26/2020 10:19 AM   Modules accepted: Orders

## 2020-08-07 ENCOUNTER — Other Ambulatory Visit: Payer: Self-pay | Admitting: Family Medicine

## 2020-08-09 ENCOUNTER — Other Ambulatory Visit: Payer: Self-pay | Admitting: Gastroenterology

## 2020-08-14 ENCOUNTER — Institutional Professional Consult (permissible substitution): Payer: Medicare Other | Admitting: Pulmonary Disease

## 2020-08-15 ENCOUNTER — Ambulatory Visit: Payer: Medicare Other | Admitting: Gastroenterology

## 2020-08-15 ENCOUNTER — Encounter: Payer: Self-pay | Admitting: Gastroenterology

## 2020-08-22 ENCOUNTER — Encounter: Payer: Self-pay | Admitting: Gastroenterology

## 2020-08-22 ENCOUNTER — Other Ambulatory Visit: Payer: Self-pay

## 2020-08-22 ENCOUNTER — Ambulatory Visit: Payer: Medicare Other | Admitting: Gastroenterology

## 2020-08-22 VITALS — BP 120/62 | HR 83 | Ht 60.0 in | Wt 110.6 lb

## 2020-08-22 DIAGNOSIS — Z8711 Personal history of peptic ulcer disease: Secondary | ICD-10-CM

## 2020-08-22 DIAGNOSIS — K5900 Constipation, unspecified: Secondary | ICD-10-CM

## 2020-08-22 DIAGNOSIS — K862 Cyst of pancreas: Secondary | ICD-10-CM

## 2020-08-22 DIAGNOSIS — R1030 Lower abdominal pain, unspecified: Secondary | ICD-10-CM

## 2020-08-22 MED ORDER — POLYETHYLENE GLYCOL 3350 17 G PO PACK: PACK | ORAL | 0 refills | Status: DC

## 2020-08-22 NOTE — Progress Notes (Signed)
HPI :  87 year old female with a history of chronic rheumatic disease/microscopic polyangiitis, previously on rituximab, history of CMV gastritis and gastric ulcer, history of H. pylori and symptomatic anemia, here for follow-up visit for abdominal pain and constipation.  See prior notes for full details of her case.  I initially saw her in June 2020 when she presented with symptomatic anemia.  She had a gastric ulcer and CMV gastritis, severe esophageal candidiasis.  She was treated for that and recovered.  Had a follow-up endoscopy with me the following October, again had esophageal candidiasis, CMV was negative but H. pylori testing was positive and received a course of Pylera for that.  Follow-up H. pylori testing was negative.  She has since stayed on Nexium 40 mg once daily.  Her stomachs been feeling okay in that regard and no reflux symptoms that bother her.  Main symptom is lower abdominal intermittent cramping.  She states is been going on for the past few weeks.  She has ongoing constipation that has been bothering her more recently.  She has been having a bowel movement variably, has been using some manual maneuvers to help get stool out.  She states when she has a bowel movement her stomach feels better.  Has tried Ex-Lax chocolates which has provided some relief.  She also drinks heated prune juice which can also stimulate a bowel movement.  No blood in her stools.  She is not taking any NSAIDs.  She is taking prednisone 50 mg a day as she has recently developed a bilateral foot drop and has been seen by neurology.  She had a nerve biopsy to rule out vasculitis/involvement of her rheumatic disease of her nerves, that remains pending.  She has been placed on a high-dose prednisone taper to see if this would help.  She is in a wheelchair now on can walk with assistance of a walker.  She denies any symptoms of blood in her stool otherwise.  No nausea or vomiting.  She is taking Nexium 40 mg a  day and denies any sniffing and heartburn that bothers her.  She is eating well, prednisone has significantly improved her appetite.  No weight loss.  Of note since of last seen her she had an MRI of her T-spine this past August which showed incidental noting of cyst in her pancreas.  She had a follow-up CT scan by her primary care in October with results as outlined below.  She has a greater than 3 cm cystic lesion in her pancreas.  Prior work-up: EGD 04/29/19 -  - Diffuse, white and yellow plaques were found in the upper third of the esophagus and in the middle third of the esophagus consistent with esophageal candidiasis. - The exam of the esophagus was otherwise normal. - Patchy inflammation characterized by adherent heme, erythema and granularity was found in the gastric antrum. A focal erosion was noted at the site of the prior ulcer - ulcer has healed other than small erosion. - The exam of the stomach was otherwise normal. - Biopsies were taken with a cold forceps in the gastric body, at the incisura and in the gastric antrum for histology - rule out CMV / H pylori. - The duodenal bulb and second portion of the duodenum were normal.  Biopsies positive for H pylori, negative for CMV - treated with Pylera with eradication testing negative  EGD 01/02/19 Esophagogastric landmarks identified. - Esophageal plaques were found, consistent with candidiasis. - Non-bleeding gastric ulcer with no  stigmata of bleeding - biopsies c/w CMV gastritis - Normal duodenal bulb and second portion of the duodenum. - Biopsies were taken with a cold forceps for Helicobacter pylori testing.  Colonoscopy 01/02/19 - Perianal dermatitis found on perianal exam. - The examined portion of the ileum was normal. - Two 3 mm polyps in the sigmoid colon, removed with a cold biopsy forceps. Resected and retrieved. - Internal hemorrhoids with inflammatory changes. - Several minutes spent lavaging the colon to achieve  adequate views. - The examination was otherwise normal. Inflamed internal hemorrhoids are causing rectal bleeding, I suspect ulcer on EGD may be more likely the cause of worsening anemia with component of hemorrhoidal bleeding.  H pylori eradication testing negative 07/22/19   CT scan 04/28/20 - IMPRESSION: Two multiloculated pancreatic cysts measuring up to 3.1 cm in the pancreatic head/uncinate process. No high risk imaging features. As such, in a patient of this age, follow-up CT or MRI abdomen with/without contrast is suggested in 2 years, as clinically Warranted.       Past Medical History:  Diagnosis Date  . Allergic rhinitis, cause unspecified   . Allergy   . Anxiety state, unspecified   . Blood transfusion without reported diagnosis    2020  . Cataract    removed both eyes   . Chronic kidney disease   . Disorder of bone and cartilage, unspecified   . Esophageal candidiasis ([place])   . Foot drop   . Gastric ulcer   . History of CMV   . History of Helicobacter pylori infection   . Hypothyroid   . Irritable bowel syndrome   . Lumbago   . Microscopic polyangiitis ([place])   . Other and unspecified hyperlipidemia   . Perforation of tympanic membrane, unspecified   . Unspecified essential hypertension      Past Surgical History:  Procedure Laterality Date  . ABDOMINAL HYSTERECTOMY    . BIOPSY  01/02/2019   Procedure: BIOPSY;  Surgeon: Yetta Flock, MD;  Location: [facility] ENDOSCOPY;  Service: Gastroenterology;;  . cataract surgery  5/12 and 12/24/2012   both eyes  . COLONOSCOPY    . COLONOSCOPY WITH PROPOFOL N/A 01/02/2019   Procedure: COLONOSCOPY WITH PROPOFOL;  Surgeon: Yetta Flock, MD;  Location: [place];  Service: Gastroenterology;  Laterality: N/A;  . ESOPHAGOGASTRODUODENOSCOPY (EGD) WITH PROPOFOL N/A 01/02/2019   Procedure: ESOPHAGOGASTRODUODENOSCOPY (EGD) WITH PROPOFOL;  Surgeon: Yetta Flock, MD;  Location: [facility];  Service:  Gastroenterology;  Laterality: N/A;  . NSVD     x1  . POLYPECTOMY  01/02/2019   Procedure: POLYPECTOMY;  Surgeon: Yetta Flock, MD;  Location[facility] ENDOSCOPY;  Service: Gastroenterology;;  . POLYPECTOMY    . TONSILLECTOMY    . UPPER GASTROINTESTINAL ENDOSCOPY     Family History  Problem Relation Age of Onset  . Lung cancer Mother 28  . Lung disease Father 97  . COPD Other        sibling  . Colon cancer Neg Hx   . Colon polyps Neg Hx   . Esophageal cancer Neg Hx   . Rectal cancer Neg Hx   . Stomach cancer Neg Hx    Social History   Tobacco Use  . Smoking status: Never Smoker  . Smokeless tobacco: Never Used  Vaping Use  . Vaping Use: Never used  Substance Use Topics  . Alcohol use: No  . Drug use: No   Current Outpatient Medications  Medication Sig Dispense Refill  . amLODipine (NORVASC) 10  MG tablet TAKE ONE TABLET BY MOUTH DAILY 90 tablet 0  . atorvastatin (LIPITOR) 20 MG tablet TAKE ONE TABLET BY MOUTH DAILY 90 tablet 3  . calcitRIOL (ROCALTROL) 0.25 MCG capsule Take 0.25 mcg by mouth every Monday, Wednesday, and Friday.     . clotrimazole-betamethasone (LOTRISONE) cream Apply 1 application topically 2 (two) times daily. 30 g 0  . esomeprazole (NEXIUM) 40 MG capsule TAKE ONE CAPSULE BY MOUTH DAILY 30 capsule 1  . furosemide (LASIX) 40 MG tablet Take 0.5 tablets (20 mg total) by mouth 3 (three) times a week. Monday, Wed, Friday (Patient taking differently: Take 40 mg by mouth every Monday, Wednesday, and Friday.) 30 tablet   . gabapentin (NEURONTIN) 100 MG capsule Take 2 capsules by mouth at night for neuropathy pain 180 capsule 3  . hydrALAZINE (APRESOLINE) 50 MG tablet TAKE ONE TABLET BY MOUTH THREE TIMES A DAY 270 tablet 0  . levothyroxine (SYNTHROID) 50 MCG tablet TAKE ONE TABLET BY MOUTH EVERY MORNING BEFORE BREAKFAST 90 tablet 1  . metoprolol succinate (TOPROL-XL) 50 MG 24 hr tablet Take 1 tablet (50 mg total) by mouth 2 (two) times daily. Take with or  immediately following a meal. 180 tablet 0  . Multiple Vitamins-Minerals (CENTRUM SILVER 50+WOMEN) TABS Take 1 tablet by mouth daily with breakfast.    . polyethylene glycol (MIRALAX) 17 g packet Take as directed twice daily and titrate as needed 14 each 0  . predniSONE (DELTASONE) 50 MG tablet Take 50 mg by mouth daily with breakfast.    . traMADol (ULTRAM) 50 MG tablet TAKE ONE TABLET BY MOUTH EVERY 6 HOURS AS NEEDED FOR UP TO 5 DAYS 30 tablet 0  . valACYclovir (VALTREX) 1000 MG tablet Take 1,000 mg by mouth 3 (three) times daily. Started 03/27/20 - taking for 7 days.     No current facility-administered medications for this visit.   Allergies  Allergen Reactions  . Alendronate Sodium Shortness Of Breath  . Amoxicillin Diarrhea    Severe diarrhea  . Codeine Nausea Only  . Valsartan Swelling    Gums/throat swell     Review of Systems: All systems reviewed and negative except where noted in HPI.   Lab Results  Component Value Date   WBC 14.5 (H) 03/24/2020   HGB 12.3 03/24/2020   HCT 36.5 03/24/2020   MCV 90.1 03/24/2020   PLT 279 03/24/2020    Lab Results  Component Value Date   CREATININE 1.43 (H) 03/24/2020   BUN 22 03/24/2020   NA 138 03/24/2020   K 3.9 03/24/2020   CL 100 03/24/2020   CO2 30 03/24/2020    Lab Results  Component Value Date   ALT 16 03/24/2020   AST 16 03/24/2020   ALKPHOS 101 01/17/2020   BILITOT 0.5 03/24/2020     Physical Exam: BP 120/62 (BP Location: Left Arm, Patient Position: Sitting)   Pulse 83   Ht 5' (1.524 m)   Wt 110 lb 9.6 oz (50.2 kg)   SpO2 98%   BMI 21.60 kg/m  Constitutional: Pleasant,well-developed, female in no acute distress, in wheelchair Abdominal: Soft, nondistended, nontender.  There are no masses palpable.  Extremities: no edema Lymphadenopathy: No cervical adenopathy noted. Neurological: Alert and oriented to person place and time. Skin: Skin is warm and dry. No rashes noted. Psychiatric: Normal mood and  affect. Behavior is normal.   ASSESSMENT AND PLAN: 87 year old female here for reassessment of the following:  Lower abdominal pain / constipation - she has  relief of her discomfort with a bowel movement and has had worsening constipation lately, I think that is probably driving her symptoms.  Her colonoscopy is up-to-date without any high risk lesions.  Discussed options to manage this.  Recommend MiraLAX twice daily to start and titrate up or down as needed.  We will also give her some Bentyl 10 mg every 8 hours as needed for cramping and see if that helps when she has the pain.  If this regimen does not work for her asked her to call me back after 2 weeks or so and we can reassess.  She agreed, all questions answered.  History of gastric ulcer - history of GI bleeding in the setting of immunosuppressive therapy and CMV/H. pylori gastritis.  She is now back on high-dose prednisone at 50 mg a day for bilateral foot drops and question of vasculitis involving her peripheral nerves.  She follows with Dr. Amil Amen of rheumatology for this issue.  Would recommend she continue PPI in the setting of high-dose prednisone to prevent recurrent ulcers.  She agreed and is tolerating it well  Pancreatic cysts - incidentally noted on MRI T-spine which was further evaluated with a CT scan of her pancreas in October.  There is no high risk features of mural nodules or wall thickening or pancreatic ductal dilation but she does have a multiloculated cyst greater than 3 cm in size.  Given her comorbidities it might be most reasonable to survey this with interval MRCP in 1 year or so although I will touch base with my advanced endoscopy colleagues to see if they would want to consider EUS at some point. She agreed.   Ford Cliff Cellar, MD [facility]

## 2020-08-22 NOTE — Patient Instructions (Signed)
If you are age 85 or older, your body mass index should be between 23-30. Your Body mass index is 21.6 kg/m. If this is out of the aforementioned range listed, please consider follow up with your Primary Care Provider.  If you are age 30 or younger, your body mass index should be between 19-25. Your Body mass index is 21.6 kg/m. If this is out of the aformentioned range listed, please consider follow up with your Primary Care Provider.   We have sent the following medications to your pharmacy for you to pick up at your convenience: Bentyl 10 [id number]:[id number] 1 tablet every 8 hours as needed  Please purchase the following medications over the counter and take as directed: Miralax: Take twice a day and titrate as needed  Continue Nexium  Thank you for entrusting me with your care and for choosing [facility], Dr. [place] Cellar

## 2020-08-23 ENCOUNTER — Other Ambulatory Visit: Payer: Self-pay | Admitting: Family Medicine

## 2020-08-23 ENCOUNTER — Telehealth: Payer: Self-pay | Admitting: Neurology

## 2020-08-23 ENCOUNTER — Telehealth: Payer: Self-pay | Admitting: Gastroenterology

## 2020-08-23 ENCOUNTER — Ambulatory Visit: Payer: Self-pay

## 2020-08-23 NOTE — Telephone Encounter (Signed)
Records faxed and confirmed to [employer].

## 2020-08-23 NOTE — Telephone Encounter (Signed)
Error 1st note:  Pt.'s husband [name] is on [employer]. He states wife's office visit notes needs to be faxed to [place] so they can order her brace.  Their fax # is [phone]

## 2020-08-23 NOTE — Telephone Encounter (Signed)
Spoke with patient's husband, [name], in regards to Dr. Doyne Keel recommendations. He is aware that we will hold off on EUS at this time and recommend an MRCP in October 2022. Mr. [name] verbalized understanding and had no concerns at the end of the call.   Reminder in epic for MRCP in 04/2021.

## 2020-08-23 NOTE — Telephone Encounter (Signed)
Received [employer] order for AFO brace. Signed by Dr. Krista Blue. Faxed and confirmed back to [phone].

## 2020-08-23 NOTE — Telephone Encounter (Signed)
I discussed her case with my advanced endoscopy colleagues. Holding off on EUS right now, recommending MRCP 1 year from the time of her last CT scan, so October 2022 to further evaluate pancreatic cyst.   Herbert Seta can you please let the patient know we are recommending MRCP in October of this year and plan to hold off on EUS. Can you please place recall for MRCP for her? Thanks

## 2020-08-23 NOTE — Telephone Encounter (Signed)
Pt.'s husband [name] is on [place]. He states wife's office visit notes needs to be faxed to [place] so they can order her brace.  Their fax # is [phone]

## 2020-08-27 ENCOUNTER — Other Ambulatory Visit: Payer: Self-pay | Admitting: Family Medicine

## 2020-08-28 ENCOUNTER — Ambulatory Visit: Payer: Medicare Other | Admitting: Pulmonary Disease

## 2020-08-28 ENCOUNTER — Other Ambulatory Visit: Payer: Self-pay

## 2020-08-28 ENCOUNTER — Encounter: Payer: Self-pay | Admitting: Pulmonary Disease

## 2020-08-28 VITALS — BP 120/65 | HR 82 | Temp 97.5°F | Ht 60.0 in | Wt 110.0 lb

## 2020-08-28 DIAGNOSIS — M21371 Foot drop, right foot: Secondary | ICD-10-CM

## 2020-08-28 DIAGNOSIS — M21372 Foot drop, left foot: Secondary | ICD-10-CM

## 2020-08-28 DIAGNOSIS — I776 Arteritis, unspecified: Secondary | ICD-10-CM | POA: Diagnosis not present

## 2020-08-28 DIAGNOSIS — D849 Immunodeficiency, unspecified: Secondary | ICD-10-CM

## 2020-08-28 DIAGNOSIS — R918 Other nonspecific abnormal finding of lung field: Secondary | ICD-10-CM

## 2020-08-28 DIAGNOSIS — I7782 Antineutrophilic cytoplasmic antibody (ANCA) vasculitis: Secondary | ICD-10-CM

## 2020-08-28 NOTE — Progress Notes (Signed)
Synopsis: Referred in January 2022 for small pulmonary nodules abnormal CT by Star Age, MD  Subjective:   PATIENT ID: Emily Cannon GENDER: female DOB: July 01, 1933, MRN: [id number]  Chief Complaint  Patient presents with  . Consult    Lung nodules    This is an 87 year old female, past medical history of ANCA associated vasculitis, P ANCA positivity, 1: 640 in March 2020., status post Rituxan followed by rheumatology, initially presented with kidney disease.  Had recent CT scan of the chest with small pulmonary nodules.  Patient here today for evaluation of pulmonary nodules.  Lifelong non-smoker.  Also has bilateral foot drop and has been seen by neurology.  Recent nerve biopsy that was negative for vasculitis.  Patient denies hemoptysis.  Remains on prednisone.  Is vaccinated for COVID.   Past Medical History:  Diagnosis Date  . Allergic rhinitis, cause unspecified   . Allergy   . Anxiety state, unspecified   . Blood transfusion without reported diagnosis    2020  . Cataract    removed both eyes   . Chronic kidney disease   . Disorder of bone and cartilage, unspecified   . Esophageal candidiasis ([place])   . Foot drop   . Gastric ulcer   . History of CMV   . History of Helicobacter pylori infection   . Hypothyroid   . Irritable bowel syndrome   . Lumbago   . Microscopic polyangiitis ([place])   . Other and unspecified hyperlipidemia   . Perforation of tympanic membrane, unspecified   . Unspecified essential hypertension      Family History  Problem Relation Age of Onset  . Lung cancer Mother 44  . Lung disease Father 61  . COPD Other        sibling  . Colon cancer Neg Hx   . Colon polyps Neg Hx   . Esophageal cancer Neg Hx   . Rectal cancer Neg Hx   . Stomach cancer Neg Hx      Past Surgical History:  Procedure Laterality Date  . ABDOMINAL HYSTERECTOMY    . BIOPSY  01/02/2019   Procedure: BIOPSY;  Surgeon: Yetta Flock, MD;  Location: [facility] ENDOSCOPY;   Service: Gastroenterology;;  . cataract surgery  5/12 and 12/24/2012   both eyes  . COLONOSCOPY    . COLONOSCOPY WITH PROPOFOL N/A 01/02/2019   Procedure: COLONOSCOPY WITH PROPOFOL;  Surgeon: Yetta Flock, MD;  Location: [place];  Service: Gastroenterology;  Laterality: N/A;  . ESOPHAGOGASTRODUODENOSCOPY (EGD) WITH PROPOFOL N/A 01/02/2019   Procedure: ESOPHAGOGASTRODUODENOSCOPY (EGD) WITH PROPOFOL;  Surgeon: Yetta Flock, MD;  Location: [place];  Service: Gastroenterology;  Laterality: N/A;  . NSVD     x1  . POLYPECTOMY  01/02/2019   Procedure: POLYPECTOMY;  Surgeon: Yetta Flock, MD;  Location: [facility] ENDOSCOPY;  Service: Gastroenterology;;  . POLYPECTOMY    . TONSILLECTOMY    . UPPER GASTROINTESTINAL ENDOSCOPY      Social History   Socioeconomic History  . Marital status: Married    Spouse name: Jeneen Rinks  . Number of children: 2  . Years of education: 47  . Highest education level: High school graduate  Occupational History  . Occupation: retired Research scientist (physical sciences)  Tobacco Use  . Smoking status: Never Smoker  . Smokeless tobacco: Never Used  Vaping Use  . Vaping Use: Never used  Substance and Sexual Activity  . Alcohol use: No  . Drug use: No  . Sexual activity: Not Currently  Other  Topics Concern  . Not on file  Social History Narrative   Lives at home with her husband.   Right-handed.   1-2 cups coffee daily, occasional tea or soda.   Social Determinants of Health   Financial Resource Strain: Not on file  Food Insecurity: Not on file  Transportation Needs: Not on file  Physical Activity: Not on file  Stress: Not on file  Social Connections: Not on file  Intimate Partner Violence: Not on file     Allergies  Allergen Reactions  . Alendronate Sodium Shortness Of Breath  . Amoxicillin Diarrhea    Severe diarrhea  . Codeine Nausea Only  . Valsartan Swelling    Gums/throat swell     Outpatient Medications Prior to Visit  Medication Sig  Dispense Refill  . amLODipine (NORVASC) 10 MG tablet TAKE ONE TABLET BY MOUTH DAILY 90 tablet 3  . atorvastatin (LIPITOR) 20 MG tablet TAKE ONE TABLET BY MOUTH DAILY 90 tablet 3  . calcitRIOL (ROCALTROL) 0.25 MCG capsule Take 0.25 mcg by mouth every Monday, Wednesday, and Friday.     . clotrimazole-betamethasone (LOTRISONE) cream Apply 1 application topically 2 (two) times daily. 30 g 0  . esomeprazole (NEXIUM) 40 MG capsule TAKE ONE CAPSULE BY MOUTH DAILY 30 capsule 1  . furosemide (LASIX) 40 MG tablet Take 0.5 tablets (20 mg total) by mouth 3 (three) times a week. Monday, Wed, Friday (Patient taking differently: Take 40 mg by mouth every Monday, Wednesday, and Friday.) 30 tablet   . gabapentin (NEURONTIN) 100 MG capsule Take 2 capsules by mouth at night for neuropathy pain 180 capsule 3  . hydrALAZINE (APRESOLINE) 50 MG tablet TAKE ONE TABLET BY MOUTH THREE TIMES A DAY 270 tablet 0  . levothyroxine (SYNTHROID) 50 MCG tablet TAKE ONE TABLET BY MOUTH EVERY MORNING BEFORE BREAKFAST 90 tablet 1  . metoprolol succinate (TOPROL-XL) 50 MG 24 hr tablet Take 1 tablet (50 mg total) by mouth 2 (two) times daily. Take with or immediately following a meal. 180 tablet 0  . Multiple Vitamins-Minerals (CENTRUM SILVER 50+WOMEN) TABS Take 1 tablet by mouth daily with breakfast.    . polyethylene glycol (MIRALAX) 17 g packet Take as directed twice daily and titrate as needed 14 each 0  . predniSONE (DELTASONE) 50 MG tablet Take 50 mg by mouth daily with breakfast.    . traMADol (ULTRAM) 50 MG tablet TAKE ONE TABLET BY MOUTH EVERY 6 HOURS AS NEEDED FOR UP TO 5 DAYS 30 tablet 0  . valACYclovir (VALTREX) 1000 MG tablet Take 1,000 mg by mouth 3 (three) times daily. Started 03/27/20 - taking for 7 days.     No facility-administered medications prior to visit.    Review of Systems  Constitutional: Negative for chills, fever, malaise/fatigue and weight loss.  HENT: Negative for hearing loss, sore throat and tinnitus.    Eyes: Negative for blurred vision and double vision.  Respiratory: Negative for cough, hemoptysis, sputum production, shortness of breath, wheezing and stridor.   Cardiovascular: Negative for chest pain, palpitations, orthopnea, leg swelling and PND.  Gastrointestinal: Negative for abdominal pain, constipation, diarrhea, heartburn, nausea and vomiting.  Genitourinary: Negative for dysuria, hematuria and urgency.  Musculoskeletal: Negative for joint pain and myalgias.  Skin: Negative for itching and rash.  Neurological: Negative for dizziness, tingling, weakness and headaches.  Endo/Heme/Allergies: Negative for environmental allergies. Does not bruise/bleed easily.  Psychiatric/Behavioral: Negative for depression. The patient is not nervous/anxious and does not have insomnia.   All other systems reviewed  and are negative.    Objective:  Physical Exam Vitals reviewed.  Constitutional:      General: She is not in acute distress.    Appearance: She is well-developed and well-nourished.  HENT:     Head: Normocephalic and atraumatic.     Mouth/Throat:     Mouth: Oropharynx is clear and moist.  Eyes:     General: No scleral icterus.    Conjunctiva/sclera: Conjunctivae normal.     Pupils: Pupils are equal, round, and reactive to light.  Neck:     Vascular: No JVD.     Trachea: No tracheal deviation.  Cardiovascular:     Rate and Rhythm: Normal rate and regular rhythm.     Pulses: Intact distal pulses.     Heart sounds: Normal heart sounds. No murmur heard.   Pulmonary:     Effort: Pulmonary effort is normal. No tachypnea, accessory muscle usage or respiratory distress.     Breath sounds: No stridor. No wheezing, rhonchi or rales.  Abdominal:     General: Bowel sounds are normal. There is no distension.     Palpations: Abdomen is soft.     Tenderness: There is no abdominal tenderness.  Musculoskeletal:        General: No tenderness or edema.     Cervical back: Neck supple.   Lymphadenopathy:     Cervical: No cervical adenopathy.  Skin:    General: Skin is warm and dry.     Capillary Refill: Capillary refill takes less than 2 seconds.     Findings: No rash.  Neurological:     Mental Status: She is alert and oriented to person, place, and time.     Coordination: Coordination abnormal.     Gait: Gait abnormal.     Deep Tendon Reflexes: Reflexes abnormal.     Comments: Bilateral foot drop  Psychiatric:        Mood and Affect: Mood and affect normal.        Behavior: Behavior normal.      Vitals:   08/28/20 1416  BP: 120/65  Pulse: 82  Temp: (!) 97.5 F (36.4 C)  TempSrc: Tympanic  SpO2: 97%  Weight: 110 lb (49.9 kg)  Height: 5' (1.524 m)   97% on RA BMI Readings from Last 3 Encounters:  08/28/20 21.48 kg/m  08/22/20 21.60 kg/m  05/17/20 21.48 kg/m   Wt Readings from Last 3 Encounters:  08/28/20 110 lb (49.9 kg)  08/22/20 110 lb 9.6 oz (50.2 kg)  05/17/20 110 lb (49.9 kg)    CBC    Component Value Date/Time   WBC 14.5 (H) 03/24/2020 1619   RBC 4.05 03/24/2020 1619   HGB 12.3 03/24/2020 1619   HCT 36.5 03/24/2020 1619   PLT 279 03/24/2020 1619   MCV 90.1 03/24/2020 1619   MCH 30.4 03/24/2020 1619   MCHC 33.7 03/24/2020 1619   RDW 15.0 03/24/2020 1619   LYMPHSABS 3,843 03/24/2020 1619   MONOABS 1.3 (H) 01/17/2020 1431   EOSABS 116 03/24/2020 1619   BASOSABS 102 03/24/2020 1619    Chest Imaging: CT scan of the chest: 07/24/2020: 2 small upper lobe pulmonary nodules both approximately 5 mm in size no other significant findings, no parenchymal abnormalities concerning for her ANCA associated vasculitis. The patient's images have been independently reviewed by me.    Pulmonary Functions Testing Results: No flowsheet data found.  FeNO:   Pathology:   Echocardiogram:   Heart Catheterization:     Assessment &  Plan:     ICD-10-CM   1. Multiple nodules of lung  R91.8 CT Chest Wo Contrast  2. ANCA-associated vasculitis  (HCC)  I77.6   3. Immunosuppressed status ([place])  D84.9   4. Foot drop, bilateral  M21.371    M21.372    Discussion:  87 year old female, ANCA associated vasculitis, positive ANCA level, positive p-ANCA, MPO.  Patient has received Rituxan and currently on prednisone.  CT imaging that was reviewed as stated above has 2 small pulmonary nodules.  Plan:  ANCA vasculitis can be associated with pulmonary nodules however these are very small and have no evidence of vasculitic findings on CT. She does not have any hemoptysis at this time. I explained that at this moment we would to continue to observe her lung nodules. She needs a repeat noncontrasted CT of the chest in 1 year. If she develops any warning symptoms or any changes in her CT imaging between now and then if she happens to get a repeat scan she is going to let us know so she can send [place] the images for review. Continue immune suppression and follow-up per rheumatology as well as neurology evaluation with bilateral foot drop  Office visit today spent reviewing data and notes stored in epic care everywhere from recent visits at [facility].  CT imaging that was completed on 07/24/2020 was reviewed with patient today in the office.    Current Outpatient Medications:  .  amLODipine (NORVASC) 10 MG tablet, TAKE ONE TABLET BY MOUTH DAILY, Disp: 90 tablet, Rfl: 3 .  atorvastatin (LIPITOR) 20 MG tablet, TAKE ONE TABLET BY MOUTH DAILY, Disp: 90 tablet, Rfl: 3 .  calcitRIOL (ROCALTROL) 0.25 MCG capsule, Take 0.25 mcg by mouth every Monday, Wednesday, and Friday. , Disp: , Rfl:  .  clotrimazole-betamethasone (LOTRISONE) cream, Apply 1 application topically 2 (two) times daily., Disp: 30 g, Rfl: 0 .  esomeprazole (NEXIUM) 40 MG capsule, TAKE ONE CAPSULE BY MOUTH DAILY, Disp: 30 capsule, Rfl: 1 .  furosemide (LASIX) 40 MG tablet, Take 0.5 tablets (20 mg total) by mouth 3 (three) times a week. Monday, Wed, Friday (Patient taking differently: Take 40  mg by mouth every Monday, Wednesday, and Friday.), Disp: 30 tablet, Rfl:  .  gabapentin (NEURONTIN) 100 MG capsule, Take 2 capsules by mouth at night for neuropathy pain, Disp: 180 capsule, Rfl: 3 .  hydrALAZINE (APRESOLINE) 50 MG tablet, TAKE ONE TABLET BY MOUTH THREE TIMES A DAY, Disp: 270 tablet, Rfl: 0 .  levothyroxine (SYNTHROID) 50 MCG tablet, TAKE ONE TABLET BY MOUTH EVERY MORNING BEFORE BREAKFAST, Disp: 90 tablet, Rfl: 1 .  metoprolol succinate (TOPROL-XL) 50 MG 24 hr tablet, Take 1 tablet (50 mg total) by mouth 2 (two) times daily. Take with or immediately following a meal., Disp: 180 tablet, Rfl: 0 .  Multiple Vitamins-Minerals (CENTRUM SILVER 50+WOMEN) TABS, Take 1 tablet by mouth daily with breakfast., Disp: , Rfl:  .  polyethylene glycol (MIRALAX) 17 g packet, Take as directed twice daily and titrate as needed, Disp: 14 each, Rfl: 0 .  predniSONE (DELTASONE) 50 MG tablet, Take 50 mg by mouth daily with breakfast., Disp: , Rfl:  .  traMADol (ULTRAM) 50 MG tablet, TAKE ONE TABLET BY MOUTH EVERY 6 HOURS AS NEEDED FOR UP TO 5 DAYS, Disp: 30 tablet, Rfl: 0 .  valACYclovir (VALTREX) 1000 MG tablet, Take 1,000 mg by mouth 3 (three) times daily. Started 03/27/20 - taking for 7 days., Disp: , Rfl:    I spent  62 minutes dedicated to the care of this patient on the date of this encounter to include pre-visit review of records, face-to-face time with the patient discussing conditions above, post visit ordering of testing, clinical documentation with the electronic health record, making appropriate referrals as documented, and communicating necessary findings to members of the patients care team.   Garner Nash, DO [facility] 08/28/2020 2:28 PM

## 2020-08-28 NOTE — Patient Instructions (Signed)
Thank you for visiting Dr. Valeta Harms at [facility]. Today we recommend the following:  Orders Placed This Encounter  Procedures  . CT Chest Wo Contrast   Repeat CT chest in 12 months   Return in about 1 year (around 08/28/2021) for with APP or Dr. Valeta Harms.    Please do your part to reduce the spread of COVID-19.

## 2020-09-25 ENCOUNTER — Encounter: Payer: Self-pay | Admitting: Neurology

## 2020-09-25 ENCOUNTER — Ambulatory Visit: Payer: Medicare Other | Admitting: Neurology

## 2020-09-25 VITALS — BP 111/56 | HR 58 | Ht 60.0 in | Wt 110.0 lb

## 2020-09-25 DIAGNOSIS — R269 Unspecified abnormalities of gait and mobility: Secondary | ICD-10-CM | POA: Diagnosis not present

## 2020-09-25 DIAGNOSIS — G63 Polyneuropathy in diseases classified elsewhere: Secondary | ICD-10-CM | POA: Diagnosis not present

## 2020-09-25 DIAGNOSIS — M359 Systemic involvement of connective tissue, unspecified: Secondary | ICD-10-CM | POA: Diagnosis not present

## 2020-09-25 NOTE — Progress Notes (Signed)
Chief Complaint  Patient presents with  . Follow-up    She is here with her husband, Emily Cannon. Gait abnormality. She is now wearing AFO braces on both feet that have been helpful. She also uses a rolling walker for assistance. She is no longer having a nerve biopsy at [facility]. She has seen pulmonology about the lung nodule and it will be monitored.    HISTORICAL  Emily Cannon is a 87 year old female seen in request by orthopedic surgeon Dr. Suella Broad and her primary care physician Dr. Carolann Littler for evaluation of gait abnormality, bilateral feet weakness, initial evaluation was with her husband on March 28, 2020  I reviewed and summarized the referring note. 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 Amil Amen receiving IV infusion every 6 months, patient does not know the name of medication, I assumed it was rituximab,  She also had kidney involvement, was treated by kidney physician Dr. 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 at the beginning of 2021, then she had significant worsening since June 2021,  She started to have left leg numbness extending from top of left foot to lateral leg, left leg weakness, began to rely on her walker, continue to progress over the past few months, now also developed right foot numbness, weakness, even with walker, she has significant difficulty,  She also noted to have worsening memory loss, has occasionally bowel and bladder incontinence, she denies visual loss, no dysarthria, no dysphagia  UPDATE May 08 2020:  MRI of brain on Sept  27 2021 showed mild generalized atrophy, no acute abnormalities. MRI of cervical spine showed multi-level degenerative changes, C5-6, C6-7 moderate canal stenosis. MRI of thoracic spine: widespread thoracic spine degeneration,  Mild spinal stenosis, Right thyroid lobe nodule 2.3 cm.  Laboratory evaluation showed elevated WBC 14.5, Hg 12.3, CMP creat 1.43, TSH 2.35, triglyceride 533  EMG nerve conduction study today showed evidence of length dependent severe sensorimotor polyneuropathy, there is also evidence of chronic neuropathic changes involving left cervical myotomes, T1, C8 and bilateral lumbar sacral myotomes,  UPDATE Jul 18 2020: She is accompanied by her husband for an earlier appointment for progressive worsening bilateral distal leg weakness, gait abnormalities, increased difficulty to be taking care of at home  She denies double vision, no dysarthria, swallowing difficulty, denies upper extremity weakness, denies significant lower extremity sensory loss, mild low back pain, constipation, but denies incontinence  We personally reviewed MRI of lumbar spine from [facility] in August 2021, multilevel degenerative changes, 2 3, moderate central, moderate right foraminal stenosis, most noticeable at L3-4, with mild to moderate foraminal narrowing, no significant canal stenosis,  There was also reported mild T cystic partially demonstrated changes of pancreatic head, suggest for pancreatic head neoplasm, versus benign cystic change  This led to CT of abdomen with without contrast on April 27, 2020, 2 multilobulated pancreatic cyst measuring up to 3.1 cm in pancreatic head, no high risk imaging features,  EMG nerve conduction study in October 2021, showed severe sensorimotor polyneuropathy, also chronic neuropathic changes involving bilateral lumbar sacral myotomes, and the left cervical myotomes.  She had left ankle  brace in October 2021, helped her left foot drop some, but she noticed  progressive right ankle weakness, increased gait abnormality.  Has appointment pending with [facility] neuromuscular specialist on August 08, 2019  UPDATE Sep 25 2020: She was seen by [facility] neurologist Dr. Orvilla Fus, MD - 08/07/2020    87 year old female with history of Wegener Granulomatosis (hemoptysis and impaired renal function) who has been stable on rituximab infusion every 6 months (managed by Dr. Amil Amen, rheumatologist, in [place]). The patient developed left foot drop along with tingling and painful sensation along peroneal nerve distribution in June 2021. A few months after that (around September 2021), she developed the same symptoms in her right foot. No arms involvement. Her kidney function has been stable. She received rituximab infusion every Octobrer and April and last one was in October 2021. She was referred to see a neurologist in [place] and EMG was performed in October 2021, which showed chronic bilateral lumbosacral radiculopathy, involving L4-L5, and S1 myotomes. In addition, there is evidence of length dependent sensorimotor polyneuropathy.   On my exam, she clearly has significant weakness in left > right peroneal, tibial, and sciatic (knee flexion) nerve distribution. There is sensory involvement primarily in the peroneal nerve distribution, bilaterally.   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 We will repeat her EMG study to determine severity and location of nerve biopsy. We will send labs to evaluate her kidney function, ANCA, inflammatory markers. She will need a nerve biopsy which will be determined after the EMG study. I will start her on prednisone 1 mg/kg (50 mg daily) for vasculitis treatment after the EMG study is done.   The patient had her EMG study done on 08/09/2020 which was consistent with mononeuritis  multiplex. There is ongoing denervation which suggestive of active inflammation. The patient underwent a right sural nerve biopsy on 08/11/2020, findings consistent with peripheral neuropathy, no evidence of vasculitis or inflammation, negative for amyloid, Congo red stain is negative,  She was started on prednisone 50 mg since January 2022, on tapering dose, she denies significant improvement, was also seen by [facility] rheumatologist Dr. Festus Holts  1.Consider rituximab treatment failure, change to cyclophosphamide. Given her age, frailty (though in overall great shape for her age), and prior infectious complications earlier on during remission induction therapy, this is less ideal.  2. Re-induce with rituximab at a higher dose than the 500 mg maintenance regimen she was on, either 1000 mg day 0 and 15 RA dosing or RAVE protocol weekly dosing x 4 weeks. I would also strongly consider adding avacopan 30 mg BID as an adjunct, with hopes this will add further disease control and also allow for rapid prednisone taper.   Receiving higher dose of rituximab induction, coordinated by her rheumatologist Dr. Melissa Noon office, wearing bilateral AFO, which has greatly improved her gait, nowcambulate with a walker, but there was no significant improvement in her lower extremity paresthesia, or weakness, she denies significant neuropathic pain  REVIEW OF SYSTEMS: Full 14 system review of systems performed and notable only for as above All other review of systems were negative.  ALLERGIES: Allergies  Allergen Reactions  . Alendronate Sodium Shortness Of Breath  . Amoxicillin Diarrhea    Severe diarrhea  . Codeine Nausea Only  . Valsartan Swelling    Gums/throat swell    HOME MEDICATIONS: Current Outpatient Medications  Medication Sig Dispense Refill  .  amLODipine (NORVASC) 10 MG tablet TAKE ONE TABLET BY MOUTH DAILY 90 tablet 3  . atorvastatin (LIPITOR) 20 MG tablet TAKE ONE TABLET BY MOUTH DAILY 90  tablet 3  . calcitRIOL (ROCALTROL) 0.25 MCG capsule Take 0.25 mcg by mouth every Monday, Wednesday, and Friday.     . clotrimazole-betamethasone (LOTRISONE) cream Apply 1 application topically 2 (two) times daily. 30 g 0  . esomeprazole (NEXIUM) 40 MG capsule TAKE ONE CAPSULE BY MOUTH DAILY 30 capsule 1  . furosemide (LASIX) 40 MG tablet Take 0.5 tablets (20 mg total) by mouth 3 (three) times a week. Monday, Wed, Friday (Patient taking differently: Take 40 mg by mouth every Monday, Wednesday, and Friday.) 30 tablet   . gabapentin (NEURONTIN) 100 MG capsule Take 2 capsules by mouth at night for neuropathy pain 180 capsule 3  . hydrALAZINE (APRESOLINE) 50 MG tablet TAKE ONE TABLET BY MOUTH THREE TIMES A DAY 270 tablet 0  . levothyroxine (SYNTHROID) 50 MCG tablet TAKE ONE TABLET BY MOUTH EVERY MORNING BEFORE BREAKFAST 90 tablet 1  . metoprolol succinate (TOPROL-XL) 50 MG 24 hr tablet Take 1 tablet (50 mg total) by mouth 2 (two) times daily. Take with or immediately following a meal. 180 tablet 0  . Multiple Vitamins-Minerals (CENTRUM SILVER 50+WOMEN) TABS Take 1 tablet by mouth daily with breakfast.    . polyethylene glycol (MIRALAX) 17 g packet Take as directed twice daily and titrate as needed 14 each 0  . predniSONE (DELTASONE) 10 MG tablet Take 10 mg by mouth daily with breakfast. Taking 2.5 tablets daily through 10/01/20 then 2 tablets daily through 10/08/20. They will then be further instructed by Dr. Baxter Flattery at [facility].    . traMADol (ULTRAM) 50 MG tablet TAKE ONE TABLET BY MOUTH EVERY 6 HOURS AS NEEDED FOR UP TO 5 DAYS 30 tablet 0  . valACYclovir (VALTREX) 1000 MG tablet Take 1,000 mg by mouth 3 (three) times daily. Started 03/27/20 - taking for 7 days.     No current facility-administered medications for this visit.    PAST MEDICAL HISTORY: Past Medical History:  Diagnosis Date  . Allergic rhinitis, cause unspecified   . Allergy   . Anxiety state, unspecified   . Blood transfusion  without reported diagnosis    2020  . Cataract    removed both eyes   . Chronic kidney disease   . Disorder of bone and cartilage, unspecified   . Esophageal candidiasis ([place])   . Foot drop   . Gastric ulcer   . History of CMV   . History of Helicobacter pylori infection   . Hypothyroid   . Irritable bowel syndrome   . Lumbago   . Microscopic polyangiitis ([place])   . Other and unspecified hyperlipidemia   . Perforation of tympanic membrane, unspecified   . Unspecified essential hypertension     PAST SURGICAL HISTORY: Past Surgical History:  Procedure Laterality Date  . ABDOMINAL HYSTERECTOMY    . BIOPSY  01/02/2019   Procedure: BIOPSY;  Surgeon: Yetta Flock, MD;  Location[facility]COPY;  Service: Gastroenterology;;  . cataract surgery  5/12 and 12/24/2012   both eyes  . COLONOSCOPY    . COLONOSCOPY WITH PROPOFOL N/A 01/02/2019   Procedure: COLONOSCOPY WITH PROPOFOL;  Surgeon: Yetta Flock, MD;  Location: [place];  Service: Gastroenterology;  Laterality: N/A;  . ESOPHAGOGASTRODUODENOSCOPY (EGD) WITH PROPOFOL N/A 01/02/2019   Procedure: ESOPHAGOGASTRODUODENOSCOPY (EGD) WITH PROPOFOL;  Surgeon: Yetta Flock, MD;  Location: [place];  Service:  Gastroenterology;  Laterality: N/A;  . NSVD     x1  . POLYPECTOMY  01/02/2019   Procedure: POLYPECTOMY;  Surgeon: Yetta Flock, MD;  Location: [facility] ENDOSCOPY;  Service: Gastroenterology;;  . POLYPECTOMY    . TONSILLECTOMY    . UPPER GASTROINTESTINAL ENDOSCOPY      FAMILY HISTORY: Family History  Problem Relation Age of Onset  . Lung cancer Mother 36  . Lung disease Father 79  . COPD Other        sibling  . Colon cancer Neg Hx   . Colon polyps Neg Hx   . Esophageal cancer Neg Hx   . Rectal cancer Neg Hx   . Stomach cancer Neg Hx     SOCIAL HISTORY: Social History   Socioeconomic History  . Marital status: Married    Spouse name: Emily Cannon  . Number of children: 2  . Years of education: 69  .  Highest education level: High school graduate  Occupational History  . Occupation: retired Research scientist (physical sciences)  Tobacco Use  . Smoking status: Never Smoker  . Smokeless tobacco: Never Used  Vaping Use  . Vaping Use: Never used  Substance and Sexual Activity  . Alcohol use: No  . Drug use: No  . Sexual activity: Not Currently  Other Topics Concern  . Not on file  Social History Narrative   Lives at home with her husband.   Right-handed.   1-2 cups coffee daily, occasional tea or soda.   Social Determinants of Health   Financial Resource Strain: Not on file  Food Insecurity: Not on file  Transportation Needs: Not on file  Physical Activity: Not on file  Stress: Not on file  Social Connections: Not on file  Intimate Partner Violence: Not on file     PHYSICAL EXAM  PHYSICAL EXAMNIATION:  Gen: NAD, conversant, well nourised, well groomed                     Cardiovascular: Regular rate rhythm, no peripheral edema, warm, nontender. Eyes: Conjunctivae clear without exudates or hemorrhage Neck: Supple, no carotid bruits. Pulmonary: Clear to auscultation bilaterally   NEUROLOGICAL EXAM:  MENTAL STATUS: Speech/cognition Awake, alert, oriented to history taking care of conversation   CRANIAL NERVES: CN II: Visual fields are full to confrontation. Pupils are round equal and briskly reactive to light. CN III, IV, VI: extraocular movement are normal. No ptosis. CN V: Facial sensation is intact to light touch CN VII: Face is symmetric with normal eye closure  CN VIII: Hearing is normal to causal conversation. CN IX, X: Phonation is normal. CN XI: Head turning and shoulder shrug are intact  MOTOR: Significant bilateral lower extremity pitting edema, upper extremity motor strength is normal, lower extremity motor strength (R/L) Hip flexion 4+/4, knee flexion 4/4, knee extension 4+/4, ankle dorsiflexion 2/1, plantarflexion 3/3  REFLEXES: Reflexes are 3 and symmetric at the biceps,  triceps, 3/3 knees, and absent at ankles. Plantar responses are mute bilaterally  SENSORY: Decreased vibratory sensation in toes, mildly length dependent decreased pinprick to mid shin level  COORDINATION: There is no trunk or limb dysmetria noted.  GAIT/STANCE: Need assistant to get up from seated position, bilateral foot drop, can ambulate with assistance of walker wearing bilateral AFO  DIAGNOSTIC DATA (LABS, IMAGING, TESTING) - I reviewed patient records, labs, notes, testing and imaging myself where available.   ASSESSMENT AND PLAN  Emily Cannon is a 87 y.o. 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  Laboratory evaluations showed no treatable etiology  CT of the chest in December 2021 showed 2 new small solid pulmonary nodule, largest 0.5 cm in the right lower lobe, will have a repeat CT chest in 12 months  Referral to physical therapy  Continue follow-up with her [facility] neurologist and rheumatologist  Total time spent reviewing the chart, obtaining history, examined patient, ordering tests, documentation, consultations and family, care coordination was 50 minutes       Marcial Pacas, M.D. Ph.D.  [facility] [place], [place] [address] Phone: [phone] Fax:      [phone]

## 2020-09-27 ENCOUNTER — Ambulatory Visit: Payer: Medicare Other | Admitting: Gastroenterology

## 2020-09-27 ENCOUNTER — Other Ambulatory Visit: Payer: Self-pay | Admitting: Family Medicine

## 2020-09-27 DIAGNOSIS — I10 Essential (primary) hypertension: Secondary | ICD-10-CM

## 2020-10-04 ENCOUNTER — Telehealth: Payer: Self-pay | Admitting: Family Medicine

## 2020-10-04 ENCOUNTER — Telehealth: Payer: Self-pay | Admitting: Neurology

## 2020-10-04 NOTE — Telephone Encounter (Signed)
Pt.'s husband [name] is calling to follow up about wife starting therapy. He asks to be called before 12pm or after 4pm. Please advise.

## 2020-10-04 NOTE — Telephone Encounter (Signed)
Ernie Hew a Rheumatologist at [facility] is calling and requesting a call back from provider regarding patient ( non emergent) . CB is [phone]

## 2020-10-04 NOTE — Telephone Encounter (Signed)
Called and spoke to [place] [phone] . They will call and get patient scheduled today . Spoke to patient's husband . Thanks Hinton Dyer

## 2020-10-04 NOTE — Telephone Encounter (Signed)
Forwarding

## 2020-10-05 ENCOUNTER — Other Ambulatory Visit: Payer: Self-pay | Admitting: Gastroenterology

## 2020-10-06 ENCOUNTER — Ambulatory Visit: Payer: Medicare Other | Attending: Neurology

## 2020-10-06 ENCOUNTER — Other Ambulatory Visit: Payer: Self-pay

## 2020-10-06 DIAGNOSIS — R2689 Other abnormalities of gait and mobility: Secondary | ICD-10-CM | POA: Insufficient documentation

## 2020-10-06 DIAGNOSIS — M6281 Muscle weakness (generalized): Secondary | ICD-10-CM | POA: Diagnosis present

## 2020-10-06 NOTE — Therapy (Addendum)
Outpatient Physical Therapy Neuro Evaluation Patient Name: Emily Cannon MRN: [id number] DOB:01/03/1933, 87 y.o., female Today's Date: 10/06/2020  PCP: Eulas Post, MD Referring Provier: Marcial Pacas, MD   PT End of Session - 10/06/20 1350    Visit Number 1    Number of Visits 21    Date for PT Re-Evaluation 12/15/20    PT Start Time 1230    PT Stop Time 1315    PT Time Calculation (min) 45 min    Activity Tolerance Patient tolerated treatment well    Behavior During Therapy [facility] for tasks assessed/performed           Past Medical History:  Diagnosis Date  . Allergic rhinitis, cause unspecified   . Allergy   . Anxiety state, unspecified   . Blood transfusion without reported diagnosis    2020  . Cataract    removed both eyes   . Chronic kidney disease   . Disorder of bone and cartilage, unspecified   . Esophageal candidiasis ([place])   . Foot drop   . Gastric ulcer   . History of CMV   . History of Helicobacter pylori infection   . Hypothyroid   . Irritable bowel syndrome   . Lumbago   . Microscopic polyangiitis ([place])   . Other and unspecified hyperlipidemia   . Perforation of tympanic membrane, unspecified   . Unspecified essential hypertension    Past Surgical History:  Procedure Laterality Date  . ABDOMINAL HYSTERECTOMY    . BIOPSY  01/02/2019   Procedure: BIOPSY;  Surgeon: Yetta Flock, MD;  Location: [facility] ENDOSCOPY;  Service: Gastroenterology;;  . cataract surgery  5/12 and 12/24/2012   both eyes  . COLONOSCOPY    . COLONOSCOPY WITH PROPOFOL N/A 01/02/2019   Procedure: COLONOSCOPY WITH PROPOFOL;  Surgeon: Yetta Flock, MD;  Location: [place];  Service: Gastroenterology;  Laterality: N/A;  . ESOPHAGOGASTRODUODENOSCOPY (EGD) WITH PROPOFOL N/A 01/02/2019   Procedure: ESOPHAGOGASTRODUODENOSCOPY (EGD) WITH PROPOFOL;  Surgeon: Yetta Flock, MD;  Location: [place];  Service: Gastroenterology;  Laterality: N/A;  . NSVD     x1  .  POLYPECTOMY  01/02/2019   Procedure: POLYPECTOMY;  Surgeon: Yetta Flock, MD;  [facility] ENDOSCOPY;  Service: Gastroenterology;;  . POLYPECTOMY    . TONSILLECTOMY    . UPPER GASTROINTESTINAL ENDOSCOPY     Patient Active Problem List   Diagnosis Date Noted  . 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 Peripheral neuropathy 05/08/2020  . Weakness 03/28/2020  . Gait abnormality 03/28/2020  . Paresthesia 03/28/2020  . Left foot drop 03/23/2020  . Heme positive stool   . Benign neoplasm of colon   . Acute blood loss anemia 12/30/2018  . Symptomatic anemia 12/15/2018  . AKI (acute kidney injury) ([place]) 12/15/2018  . Hypomagnesemia 12/15/2018  . Steroid-induced hyperglycemia 12/15/2018  . Microscopic polyangiitis ([place]) 11/13/2018  . Positive P-ANCA titer 10/14/2018  . Viral URI 05/12/2018  . Syncope 06/05/2016  . Unspecified venous (peripheral) insufficiency 11/23/2012  . Dermatitis 11/10/2012  . Hypothyroidism 05/22/2011  . TYMPANIC MEMBRANE PERFORATION, LEFT EAR 08/08/2007  . Osteopenia 07/27/2007  . Hyperlipidemia 05/11/2007  . Anxiety state 05/11/2007  . Essential hypertension 05/11/2007  . ALLERGIC RHINITIS 05/11/2007  . Irritable bowel syndrome 05/11/2007  . LOW BACK PAIN 05/11/2007  . 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 Amil Amen receiving IV infusion every 6 months. She recently got the infusion 4 days ago. She feels better after 2 days of infusion. She is also starting a new trial drug. Pt reports gradual declining in her strength and  endurance. She tries to be as independent as she can be but requires assistance with don/doff shoe/brace in there morning, cleaning around the house, cooking. She is not taking showers only sponge bath as she is not able to get in the tub. She has bil AFO which has helped her significantly with her gait.   Pain: is patient experiencing pain? No  Precautions: Other: Fall, L biceps rupture??  Weight bearing restrictions: No  Falls: Has patient fallen in last 6 months? No, Number of falls: 0  Living Environment:  Lives with: places; lives with: lives with their spouse  Lives in: House/apartment  Stairs: Yes; External: 2 steps; Rail on none going up  Has following equipment at home: Single point cane and Walker - 4 wheeled  Prior level of function: Independent with basic ADLs, Requires assistive device for independence and Needs assistance with homemaking  Patient Goals: "Be able to walk and go out do shopping, walking etc."  OBJECTIVE:   Diagnostic findings:   IMPRESSION: No evidence of acute intracranial abnormality, including acute or recent subacute infarction.   Mild generalized atrophy of the brain with mild chronic small vessel ischemic disease.   Mild ethmoid and maxillary sinus mucosal thickening.   Right mastoid effusion. IMPRESSION: 1. No acute osseous abnormality in the thoracic spine. Mild levoconvex upper thoracic scoliosis.   2. Widespread thoracic spine degeneration. Left paracentral disc herniations at T7-T8 and T8-T9 result in mild spinal stenosis and spinal cord mass, but no cord signal abnormality is identified.   3. Widespread facet hypertrophy with associated neural foraminal stenosis, severe at the bilateral T8 and moderate at the bilateral T7 nerve levels nerve levels.   4. Small degenerative ganglion cyst suspected, associated with medial left shoulder musculature posterior to the scapula. Subtle evidence of this finding on the chest CT last year,  not significantly changed since that time.   5. Right thyroid lobe nodule 2.3 cm. No follow-up Thyroid ultrasound recommended unless clinically warranted.   IMPRESSION: Cervical spondylosis as outlined with findings most notably as follows.   At C5-C6, there is moderate disc degeneration. A posterior disc osteophyte complex contributes to multifactorial moderate spinal canal stenosis, contacting and mildly flattening the ventral spinal cord. Hypertrophied ligamentum flavum also contacts the dorsal aspect of the spinal cord. Bilateral neural foraminal narrowing (mild/moderate right, moderate/severe left).   At C6-C7, there is moderate disc degeneration. A posterior disc osteophyte complex contributes to multifactorial mild/moderate spinal canal stenosis. Mild relative left neural foraminal narrowing.   No more than mild spinal canal stenosis at the remaining levels. Additional sites of neural foraminal narrowing as outlined and greatest bilaterally at C3-C4 (moderate right, moderate/severe left).   Trace multilevel spondylolisthesis as detailed.   Mild chronic C7 superior endplate deformity   Cognition:  Overall cognitive status: Within functional limits for tasks assessed     PROM Right 10/06/2020 Left 10/06/2020  Ankle dorsiflexion 0 deg 0 deg   MMT:  MMT Right 10/06/2020 Left 10/06/2020  Hip flexion 3+/5 3+/5  Hip abduction 5/5 5/5  Hip adduction 5/5 5/5  Knee flexion 5/5 3+/5  Knee extension 5/5 5/5  Ankle dorsiflexion 0/5 0/5  Ankle plantarflexion 1+/5 2+/5  Ankle inversion 0/5 0/5  Ankle eversion 0/5 0/5   Transfers:  Assistive device utilized: Environmental consultant - 4 wheeled   Bed mobility: Independent   Transfers: Independent   Gait: Distance walked: 693 ft Assistive device utilized: Environmental consultant - 4 wheeled Level of assistance: Complete Independence Comments: decreased cadence, decreased step length  Functional tests:  5 times sit to stand: 14 sec, no HHA but  pt braces post. knees against edge of table to get up. 6 minute walk test: 693 feet with rollator, no rest breaks required    Today's Treatment:  none   Patient Education:  Education details: Discussed evaluation findings and goals Person educated: Patient and Spouse Education method: Explanation Education comprehension: verbalized understanding   Home Exercise Program: TBD  Assessment: Clinical impression:  Patient is a 87 y.o. female who was seen today for Gait and mobility disorder and generalized weakness in bil LE. Objective impairments include Abnormal gait, decreased activity tolerance, decreased balance, decreased endurance, decreased mobility, decreased ROM, impaired flexibility, impaired sensation, impaired UE functional use and postural dysfunction. These impairments are limiting patient from cleaning, community activity, meal prep, laundry, shopping, yard work and church. Personal factors including Age, Past/current experiences, Time since onset of injury/illness/exacerbation and 1-2 comorbidities: CKD, bil drop foot, Wegener's disease are also affecting patient's functional outcome. Patient will benefit from skilled PT to address above impairments and improve overall function.  Rehab potential: Good  Clinical decision making: Stable/uncomplicated  Evaluation complexity: Moderate   Goals: Goals reviewed with patient? Yes  SHORT TERM GOALS:  STG Name Target Date Goal status  1 Pt will be be able to ambulate 1200' with RW to improve walking endurance Comments: Eval = 693' with RW 11/03/20 INITIAL  2 Pt will demo compliance with HEP to improve self management of symptoms. Comments: Eval = not issued 11/03/20 INITIAL  3 Pt will get a shower chair and be able to sit and pivot into tub with min A from her husband.  11/03/20 INITIAL   LONG TERM GOALS:   LTG Name Target Date Goal status  1 Pt will be able to stand up from chair without HHA and without bracing knees against  chair/bed to improve strength in bil LE Comments: Eval = needs to brace knees against chair to stand up 12/01/20 INITIAL  2 Pt will be able to ambulate >2000' with RW and without rest break to improve walking endurance Comments: Eval = 693' with RW 12/01/20 INITIAL  3 Pt will be able to ambulate at least 800' for RW and without rest break to improve functional ambulation Comments: Eval = 693' with RW without rest break 12/01/20 INITIAL  4 Pt will be able to don/doff her shoes/braces off at EOB to improve indepence. Comments: Eval =  Husband helps her with getting her shoes and braces and puts them on for her. Pt able to don/doff shoes but due to short legs her legs dangle and pt doesn't feel comfortable. 12/01/20 INITIAL   PLAN: PT frequency: 2x/week  PT duration: 10 weeks  Planned Interventions: Therapeutic exercises, Therapeutic activity, Neuro Muscular re-education, Balance training, Gait training, Patient/Family education, Joint mobilization, Stair training, Orthotic/Fit training, Cryotherapy and Moist heat  Plan for next session: Issue HEP (SLR, SL hip abduction, bridge, seated knee extensions with elastic band), work on sit to stand  [employer], PT 10/06/2020,  1:51 PM  [facility] [address] [place], [place], [address] Phone: [phone]   Fax:  [phone]  Patient name: Emily Cannon MRN: [id number] DOB: Dec 17, 1932

## 2020-10-06 NOTE — Telephone Encounter (Signed)
I called him and gave him call back number.  Have not heard back.

## 2020-10-11 NOTE — Telephone Encounter (Signed)
I spoke with pt's husband. Dr. Darnell Level just wanted to know if you were going to need to adjust pt's BP meds. Pt hasn't received the new medication yet - it is suppose to come tomorrow. Patient's husband is having surgery on Monday - so he won't be able to bring her into the office. He is able to check her BP at home - so I told him to go ahead and take it while she is at home and let us know how it is running until we are able to get her into the office.   Routing encounter to PCP.

## 2020-10-11 NOTE — Telephone Encounter (Signed)
Obviously, no way to know if we will need to adjust her medication yet until she is on the new medication.  Recommend close monitoring and be in touch if her blood pressure starts to climb on the new medication.

## 2020-10-11 NOTE — Telephone Encounter (Signed)
I called the number that was given and left a message and have never heard back. I think the prudent thing to do is to set up follow-up with [place] in the next week or 2 after she is on the medication to assess her blood pressure and we can decide at that point whether she needs anything additional help control her blood pressure.

## 2020-10-11 NOTE — Telephone Encounter (Signed)
Patient husband is calling and wanted to see if provider was able to reach Rheumatologist. Husband stated that Dr. Baxter Flattery had given patient Tavneos and stated that medication has the possibility of raising BP. Husband wanted to see if patient is suppose to continue blood pressure medication or if it will be changed, please advise. CB is [phone]

## 2020-10-13 ENCOUNTER — Other Ambulatory Visit: Payer: Self-pay

## 2020-10-13 ENCOUNTER — Ambulatory Visit: Payer: Medicare Other

## 2020-10-13 DIAGNOSIS — M6281 Muscle weakness (generalized): Secondary | ICD-10-CM

## 2020-10-13 DIAGNOSIS — R2689 Other abnormalities of gait and mobility: Secondary | ICD-10-CM

## 2020-10-13 NOTE — Therapy (Signed)
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 Allergic rhinitis, cause unspecified   . Allergy   . Anxiety state, unspecified   . Blood transfusion without reported diagnosis    2020  . Cataract    removed both eyes   . Chronic kidney disease   . Disorder of bone and cartilage, unspecified   . Esophageal candidiasis ([place])   . Foot drop   . Gastric ulcer   . History of CMV   . History of Helicobacter pylori infection   . Hypothyroid   . Irritable bowel syndrome   . Lumbago   . Microscopic polyangiitis ([place])   . Other and unspecified hyperlipidemia   . Perforation of tympanic membrane, unspecified   . Unspecified essential hypertension    Past Surgical History:  Procedure Laterality Date  . ABDOMINAL HYSTERECTOMY    . BIOPSY  01/02/2019   Procedure: BIOPSY;  Surgeon: Yetta Flock, MD;  Location: [facility] ENDOSCOPY;  Service: Gastroenterology;;  . cataract surgery  5/12 and 12/24/2012   both eyes  . COLONOSCOPY    . COLONOSCOPY WITH PROPOFOL N/A 01/02/2019   Procedure: COLONOSCOPY WITH PROPOFOL;  Surgeon: Yetta Flock, MD;  Location: [place];  Service: Gastroenterology;  Laterality: N/A;  .  ESOPHAGOGASTRODUODENOSCOPY (EGD) WITH PROPOFOL N/A 01/02/2019   Procedure: ESOPHAGOGASTRODUODENOSCOPY (EGD) WITH PROPOFOL;  Surgeon: Yetta Flock, MD;  Location: [place];  Service: Gastroenterology;  Laterality: N/A;  . NSVD     x1  . POLYPECTOMY  01/02/2019   Procedure: POLYPECTOMY;  Surgeon: Yetta Flock, MD;  Location: [facility] ENDOSCOPY;  Service: Gastroenterology;;  . POLYPECTOMY    . TONSILLECTOMY    . UPPER GASTROINTESTINAL ENDOSCOPY     Patient Active Problem List   Diagnosis Date Noted  . 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 Peripheral neuropathy 05/08/2020  . Weakness 03/28/2020  . Gait abnormality 03/28/2020  . Paresthesia 03/28/2020  . Left foot drop 03/23/2020  . Heme positive stool   . Benign neoplasm of colon   . Acute blood loss anemia 12/30/2018  . Symptomatic anemia 12/15/2018  . AKI (acute kidney injury) ([place]) 12/15/2018  . Hypomagnesemia 12/15/2018  . Steroid-induced hyperglycemia 12/15/2018  . Microscopic polyangiitis ([place]) 11/13/2018  . Positive P-ANCA titer 10/14/2018  . Viral URI 05/12/2018  . Syncope 06/05/2016  . Unspecified venous (peripheral) insufficiency 11/23/2012  . Dermatitis 11/10/2012  . Hypothyroidism 05/22/2011  . TYMPANIC MEMBRANE PERFORATION, LEFT EAR 08/08/2007  . Osteopenia 07/27/2007  . Hyperlipidemia 05/11/2007  . Anxiety state 05/11/2007  . Essential hypertension 05/11/2007  . ALLERGIC RHINITIS 05/11/2007  . Irritable bowel syndrome 05/11/2007  . LOW BACK  PAIN 05/11/2007  . HYSTERECTOMY, HX OF 05/11/2007    SUBJECTIVE: Pt reports she is doing well. She started her new meds today  Pain: is patient experiencing pain? No.    OBJECTIVE:   Today's Treatment:   SLR: 2 x 10 R and L  SL hip abduction: 2 x 10 R and L  Bridge: 2 x 10  Seated knee extension: red band: 2 x 10 R and L  Seated hamstring curls: red band: 2 x 10 R and L, asked husband to be the anchor so they can work  on it at home.  Sit to stand with bil hands on rollator: 2 x 10, cues to not let knees touch bed when standing up, mirror in front to watch knees and not letting them adduct when standing or sitting down for improved hip control with trasnfers.  Demo pt and husband on how to use tub bench to get in and out.  Patient Education:  Education details: Discussed evaluation findings and goals Person educated: Patient and Spouse Education method: Explanation Education comprehension: verbalized understanding   Home Exercise Program: Access Code: [email or web address] URL: [email or web address] Date: 10/13/2020 Prepared by: Markus Jarvis  Exercises Straight Leg Raise - 1 x daily - 7 x weekly - 2 sets - 10 reps Supine Bridge - 1 x daily - 7 x weekly - 2 sets - 10 reps Sidelying Hip Abduction - 1 x daily - 7 x weekly - 2 sets - 10 reps Seated Knee Extension with Resistance - 1 x daily - 7 x weekly - 2 sets - 10 reps Seated Hamstring Curl with Anchored Resistance - 1 x daily - 7 x weekly - 2 sets - 10 reps   Assessment: Clinical impression:  Today's session was focused on issuing initial HEP and educating patient and husband on how to perform it correctly. Written HEP issued. Rehab potential: Good  Clinical decision making: Stable/uncomplicated  Evaluation complexity: Moderate   Goals: Goals reviewed with patient? Yes  SHORT TERM GOALS:  STG Name Target Date Goal status  1 Pt will be be able to ambulate 1200' with RW to improve walking endurance Comments: Eval = 693' with RW 11/03/20 INITIAL  2 Pt will demo compliance with HEP to improve self management of symptoms. Comments: Eval = not issued 11/03/20 INITIAL  3 Pt will get a shower chair and be able to sit and pivot into tub with min A from her husband.  11/03/20 INITIAL   LONG TERM GOALS:   LTG Name Target Date Goal status  1 Pt will be able to stand up from chair without HHA and without bracing knees against chair/bed  to improve strength in bil LE Comments: Eval = needs to brace knees against chair to stand up 12/01/20 INITIAL  2 Pt will be able to ambulate >2000' with RW and without rest break to improve walking endurance Comments: Eval = 693' with RW 12/01/20 INITIAL  3 Pt will be able to ambulate at least 800' for RW and without rest break to improve functional ambulation Comments: Eval = 693' with RW without rest break 12/01/20 INITIAL  4 Pt will be able to don/doff her shoes/braces off at EOB to improve indepence. Comments: Eval =  Husband helps her with getting her shoes and braces and puts them on for her. Pt able to don/doff shoes but due to short legs her legs dangle and pt doesn't feel comfortable. 12/01/20 INITIAL   PLAN: PT frequency: 2x/week  PT duration: 10 weeks  Planned Interventions: Therapeutic exercises, Therapeutic activity, Neuro Muscular re-education, Balance training, Gait training, Patient/Family education, Joint mobilization, Stair training, Orthotic/Fit training, Cryotherapy and Moist heat  Plan for next session: Issue HEP (SLR, SL hip abduction, bridge, seated knee extensions with elastic band), work on sit to stand     Kerrie Pleasure, PT 10/13/2020,    [facility] [address] [place], [place], [address] Phone: [phone]   Fax:  [phone]  Patient name: Emily Cannon MRN: [id number] DOB: Aug 30, 1932

## 2020-10-18 ENCOUNTER — Telehealth: Payer: Self-pay | Admitting: Adult Health

## 2020-10-18 NOTE — Telephone Encounter (Signed)
I called patient to discuss Evusheld, a long acting monoclonal antibody pre-exposure prophylaxis administered to patients with decreased immune systems or intolerance/allergy to the COVID 19 vaccine as COVID19 prevention.    Unable to reach patient. Unable to leave voice mail.  Will attempt at a later date/time.    Wilber Bihari, NP

## 2020-10-20 ENCOUNTER — Other Ambulatory Visit: Payer: Self-pay

## 2020-10-20 ENCOUNTER — Ambulatory Visit: Payer: Medicare Other

## 2020-10-20 DIAGNOSIS — R2689 Other abnormalities of gait and mobility: Secondary | ICD-10-CM

## 2020-10-20 DIAGNOSIS — M6281 Muscle weakness (generalized): Secondary | ICD-10-CM

## 2020-10-20 NOTE — Therapy (Signed)
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 Allergic rhinitis, cause unspecified   . Allergy   . Anxiety state, unspecified   . Blood transfusion without reported diagnosis    2020  . Cataract    removed both eyes   . Chronic kidney disease   . Disorder of bone and cartilage, unspecified   . Esophageal candidiasis ([place])   . Foot drop   . Gastric ulcer   . History of CMV   . History of Helicobacter pylori infection   . Hypothyroid   . Irritable bowel syndrome   . Lumbago   . Microscopic polyangiitis ([place])   . Other and unspecified hyperlipidemia   . Perforation of tympanic membrane, unspecified   . Unspecified essential hypertension    Past Surgical History:  Procedure Laterality Date  . ABDOMINAL HYSTERECTOMY    . BIOPSY  01/02/2019   Procedure: BIOPSY;  Surgeon: Yetta Flock, MD;  Location: [facility] ENDOSCOPY;  Service: Gastroenterology;;  . cataract surgery  5/12 and 12/24/2012   both eyes  . COLONOSCOPY    . COLONOSCOPY WITH PROPOFOL N/A 01/02/2019   Procedure: COLONOSCOPY WITH PROPOFOL;  Surgeon: Yetta Flock, MD;  Location: [place];  Service: Gastroenterology;  Laterality: N/A;  . ESOPHAGOGASTRODUODENOSCOPY (EGD) WITH PROPOFOL N/A 01/02/2019   Procedure: ESOPHAGOGASTRODUODENOSCOPY (EGD) WITH PROPOFOL;  Surgeon:  Yetta Flock, MD;  Location: [place];  Service: Gastroenterology;  Laterality: N/A;  . NSVD     x1  . POLYPECTOMY  01/02/2019   Procedure: POLYPECTOMY;  Surgeon: Yetta Flock, MD;  Location: [facility] ENDOSCOPY;  Service: Gastroenterology;;  . POLYPECTOMY    . TONSILLECTOMY    . UPPER GASTROINTESTINAL ENDOSCOPY     Patient Active Problem List   Diagnosis Date Noted  . 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 Peripheral neuropathy 05/08/2020  . Weakness 03/28/2020  . Gait abnormality 03/28/2020  . Paresthesia 03/28/2020  . Left foot drop 03/23/2020  . Heme positive stool   . Benign neoplasm of colon   . Acute blood loss anemia 12/30/2018  . Symptomatic anemia 12/15/2018  . AKI (acute kidney injury) ([place]) 12/15/2018  . Hypomagnesemia 12/15/2018  . Steroid-induced hyperglycemia 12/15/2018  . Microscopic polyangiitis ([place]) 11/13/2018  . Positive P-ANCA titer 10/14/2018  . Viral URI 05/12/2018  . Syncope 06/05/2016  . Unspecified venous (peripheral) insufficiency 11/23/2012  . Dermatitis 11/10/2012  . Hypothyroidism 05/22/2011  . TYMPANIC MEMBRANE PERFORATION, LEFT EAR 08/08/2007  . Osteopenia 07/27/2007  . Hyperlipidemia 05/11/2007  . Anxiety state 05/11/2007  . Essential hypertension 05/11/2007  . ALLERGIC RHINITIS 05/11/2007  . Irritable bowel syndrome 05/11/2007  . LOW BACK PAIN 05/11/2007  . HYSTERECTOMY, HX OF 05/11/2007    SUBJECTIVE: Pt reports she is doing well. She started  her new meds today  Pain: is patient experiencing pain? No.    OBJECTIVE:   Today's Treatment:  10/20/20  SLR: 2 x 10 R and L, 2lbs  SL hip abduction: 2 x 10 R and L, 2lbs  Bridge: 2 x 10  SAQ: 2lbs 10 x 10" holds R and L  Supine double knee to chest: with 2lbs on each ankle: 2 x 10  Bil leg press: 70lbs 2 x 5; 50lbs 10x   Standing with normal stance on floor: 60", head turns: horizontal 10x, vertical 10x  Standing fwd step and back: 10x R  and L with Min A 10/13/20  SLR: 2 x 10 R and L  SL hip abduction: 2 x 10 R and L  Bridge: 2 x 10  Seated knee extension: red band: 2 x 10 R and L  Seated hamstring curls: red band: 2 x 10 R and L, asked husband to be the anchor so they can work on it at home.  Sit to stand with bil hands on rollator: 2 x 10, cues to not let knees touch bed when standing up, mirror in front to watch knees and not letting them adduct when standing or sitting down for improved hip control with trasnfers.  Demo pt and husband on how to use tub bench to get in and out.  Patient Education:  Education details: Discussed evaluation findings and goals Person educated: Patient and Spouse Education method: Explanation Education comprehension: verbalized understanding   Home Exercise Program: Access Code: [email or web address] URL: [email or web address] Date: 10/13/2020 Prepared by: Markus Jarvis  Exercises Straight Leg Raise - 1 x daily - 7 x weekly - 2 sets - 10 reps Supine Bridge - 1 x daily - 7 x weekly - 2 sets - 10 reps Sidelying Hip Abduction - 1 x daily - 7 x weekly - 2 sets - 10 reps Seated Knee Extension with Resistance - 1 x daily - 7 x weekly - 2 sets - 10 reps Seated Hamstring Curl with Anchored Resistance - 1 x daily - 7 x weekly - 2 sets - 10 reps   Assessment: Clinical impression:  We progressed exercises with ankle weights today. Pt is motivated to do exercises. Pt reports fair compliance with HEP at home. Pt guarded wwith head turns with vertical head turns being more challanging for patient. Pt very afraid to take a step without assistive device.  Rehab potential: Good  Clinical decision making: Stable/uncomplicated  Evaluation complexity: Moderate   Goals: Goals reviewed with patient? Yes  SHORT TERM GOALS:  STG Name Target Date Goal status  1 Pt will be be able to ambulate 1200' with RW to improve walking endurance Comments: Eval = 693' with RW 11/03/20 INITIAL  2 Pt  will demo compliance with HEP to improve self management of symptoms. Comments: Eval = not issued 11/03/20 INITIAL  3 Pt will get a shower chair and be able to sit and pivot into tub with min A from her husband.  11/03/20 INITIAL   LONG TERM GOALS:   LTG Name Target Date Goal status  1 Pt will be able to stand up from chair without HHA and without bracing knees against chair/bed to improve strength in bil LE Comments: Eval = needs to brace knees against chair to stand up 12/01/20 INITIAL  2 Pt will be able to ambulate >2000' with RW and without rest break to improve walking endurance Comments: Eval = 693' with RW 12/01/20 INITIAL  3 Pt will be  able to ambulate at least 800' for RW and without rest break to improve functional ambulation Comments: Eval = 693' with RW without rest break 12/01/20 INITIAL  4 Pt will be able to don/doff her shoes/braces off at EOB to improve indepence. Comments: Eval =  Husband helps her with getting her shoes and braces and puts them on for her. Pt able to don/doff shoes but due to short legs her legs dangle and pt doesn't feel comfortable. 12/01/20 INITIAL   PLAN: PT frequency: 2x/week  PT duration: 10 weeks  Planned Interventions: Therapeutic exercises, Therapeutic activity, Neuro Muscular re-education, Balance training, Gait training, Patient/Family education, Joint mobilization, Stair training, Orthotic/Fit training, Cryotherapy and Moist heat  Plan for next session: Continue to progress strengthening and balance exercises.    Kerrie Pleasure, PT 10/20/2020, [facility] [address] [place], [place], [address] Phone: [phone]   Fax:  [phone]  Patient name: TAITUM MENTON MRN: [id number] DOB: 01-21-1933

## 2020-10-25 ENCOUNTER — Other Ambulatory Visit: Payer: Self-pay

## 2020-10-25 ENCOUNTER — Ambulatory Visit: Payer: Medicare Other

## 2020-10-25 ENCOUNTER — Telehealth: Payer: Self-pay | Admitting: Family Medicine

## 2020-10-25 DIAGNOSIS — M6281 Muscle weakness (generalized): Secondary | ICD-10-CM

## 2020-10-25 DIAGNOSIS — R2689 Other abnormalities of gait and mobility: Secondary | ICD-10-CM

## 2020-10-25 NOTE — Telephone Encounter (Signed)
Spoke with patient's rheumatologist at [facility].  She has history of microscopic polyangiitis.  She is on multiple blood pressure medications including hydralazine which can be associated with ANCA positive vasculitis.  He called basically inquiring whether would be an option to look at other alternatives for her blood pressure.  She has apparently had some kind of intolerance with valsartan in the past.  We will get her in next week and go over her blood pressure options and try to taper off the hydralazine if possible

## 2020-10-25 NOTE — Therapy (Signed)
Outpatient Physical Therapy Treatment Patient Name: Emily Cannon MRN: [id number] DOB:1932/12/21, 87 y.o., female Today's Date: 10/25/2020   PT End of Session - 10/25/20 1321    Visit Number 4    Number of Visits 21    Date for PT Re-Evaluation 12/15/20    PT Start Time 1320    PT Stop Time 1400    PT Time Calculation (min) 40 min    Equipment Utilized During Treatment Gait belt    Activity Tolerance Patient tolerated treatment well    Behavior During Therapy WFL for tasks assessed/performed           PCP: Eulas Post, MD Referring Provier: Marcial Pacas, MD  Referring Diagnosis: R26.9 (ICD-10-CM) - Gait abnormality M35.9,G63 (ICD-10-CM) - 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 Allergic rhinitis, cause unspecified   . Allergy   . Anxiety state, unspecified   . Blood transfusion without reported diagnosis    2020  . Cataract    removed both eyes   . Chronic kidney disease   . Disorder of bone and cartilage, unspecified   . Esophageal candidiasis ([place])   . Foot drop   . Gastric ulcer   . History of CMV   . History of Helicobacter pylori infection   . Hypothyroid   . Irritable bowel syndrome   . Lumbago   . Microscopic polyangiitis ([place])   . Other and unspecified hyperlipidemia   . Perforation of tympanic membrane, unspecified   . Unspecified essential hypertension    Past Surgical History:  Procedure Laterality Date  . ABDOMINAL HYSTERECTOMY    . BIOPSY  01/02/2019   Procedure: BIOPSY;  Surgeon: Yetta Flock, MD;  Location: [facility] ENDOSCOPY;  Service: Gastroenterology;;  . cataract surgery  5/12 and 12/24/2012   both eyes  . COLONOSCOPY    . COLONOSCOPY WITH PROPOFOL N/A 01/02/2019   Procedure: COLONOSCOPY WITH PROPOFOL;  Surgeon: Yetta Flock, MD;  Location: [place];  Service:  Gastroenterology;  Laterality: N/A;  . ESOPHAGOGASTRODUODENOSCOPY (EGD) WITH PROPOFOL N/A 01/02/2019   Procedure: ESOPHAGOGASTRODUODENOSCOPY (EGD) WITH PROPOFOL;  Surgeon: Yetta Flock, MD;  Location: [place];  Service: Gastroenterology;  Laterality: N/A;  . NSVD     x1  . POLYPECTOMY  01/02/2019   Procedure: POLYPECTOMY;  Surgeon: Yetta Flock, MD;  Location: [facility] ENDOSCOPY;  Service: Gastroenterology;;  . POLYPECTOMY    . TONSILLECTOMY    . UPPER GASTROINTESTINAL ENDOSCOPY     Patient Active Problem List   Diagnosis Date Noted  . 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 Peripheral neuropathy 05/08/2020  . Weakness 03/28/2020  . Gait abnormality 03/28/2020  . Paresthesia 03/28/2020  . Left foot drop 03/23/2020  . Heme positive stool   . Benign neoplasm of colon   . Acute blood loss anemia 12/30/2018  . Symptomatic anemia 12/15/2018  . AKI (acute kidney injury) ([place]) 12/15/2018  . Hypomagnesemia 12/15/2018  . Steroid-induced hyperglycemia 12/15/2018  . Microscopic polyangiitis ([place]) 11/13/2018  . Positive P-ANCA titer 10/14/2018  . Viral URI 05/12/2018  . Syncope 06/05/2016  . Unspecified venous (peripheral) insufficiency 11/23/2012  . Dermatitis 11/10/2012  . Hypothyroidism 05/22/2011  . TYMPANIC MEMBRANE PERFORATION, LEFT EAR 08/08/2007  . Osteopenia 07/27/2007  . Hyperlipidemia 05/11/2007  . Anxiety state 05/11/2007  . Essential hypertension 05/11/2007  . ALLERGIC RHINITIS 05/11/2007  .  Irritable bowel syndrome 05/11/2007  . LOW BACK PAIN 05/11/2007  . HYSTERECTOMY, HX OF 05/11/2007    SUBJECTIVE: No new complaints.  Pain: is patient experiencing pain? No.    OBJECTIVE:   Today's Treatment:  10/25/20  Manually stretched R calf muscles  Grade IV eversion calcaneal tilt  Trial of aircast and tie down ankle brace for ankle stability and reduce foot turning int with gait. Pt demo significant improvement in ability  to keep neutral foot alignment with ankle brace plus AFO. Patient given resournces to buy ankle brace  Fwd and bwd walking: black resistance cord: 3x each way, min to mod A without AD but holding onto PT's one UE  Given resources for ankle weights for HEP  10/20/20  SLR: 2 x 10 R and L, 2lbs  SL hip abduction: 2 x 10 R and L, 2lbs  Bridge: 2 x 10  SAQ: 2lbs 10 x 10" holds R and L  Supine double knee to chest: with 2lbs on each ankle: 2 x 10  Bil leg press: 70lbs 2 x 5; 50lbs 10x   Standing with normal stance on floor: 60", head turns: horizontal 10x, vertical 10x  Standing fwd step and back: 10x R and L with Min A 10/13/20  SLR: 2 x 10 R and L  SL hip abduction: 2 x 10 R and L  Bridge: 2 x 10  Seated knee extension: red band: 2 x 10 R and L  Seated hamstring curls: red band: 2 x 10 R and L, asked husband to be the anchor so they can work on it at home.  Sit to stand with bil hands on rollator: 2 x 10, cues to not let knees touch bed when standing up, mirror in front to watch knees and not letting them adduct when standing or sitting down for improved hip control with trasnfers.  Demo pt and husband on how to use tub bench to get in and out.  Patient Education:  Education details: Discussed evaluation findings and goals Person educated: Patient and Spouse Education method: Explanation Education comprehension: verbalized understanding   Home Exercise Program: Access Code: [email or web address] URL: [email or web address] Date: 10/13/2020 Prepared by: Markus Jarvis  Exercises Straight Leg Raise - 1 x daily - 7 x weekly - 2 sets - 10 reps Supine Bridge - 1 x daily - 7 x weekly - 2 sets - 10 reps Sidelying Hip Abduction - 1 x daily - 7 x weekly - 2 sets - 10 reps Seated Knee Extension with Resistance - 1 x daily - 7 x weekly - 2 sets - 10 reps Seated Hamstring Curl with Anchored Resistance - 1 x daily - 7 x weekly - 2 sets - 10 reps   Assessment: Clinical impression:   Improved foot neutral alignment with ankle brace noted with gait today. Pt tolerated session well.  Rehab potential: Good  Clinical decision making: Stable/uncomplicated  Evaluation complexity: Moderate   Goals: Goals reviewed with patient? Yes  SHORT TERM GOALS:  STG Name Target Date Goal status  1 Pt will be be able to ambulate 1200' with RW to improve walking endurance Comments: Eval = 693' with RW 11/03/20 INITIAL  2 Pt will demo compliance with HEP to improve self management of symptoms. Comments: Eval = not issued 11/03/20 INITIAL  3 Pt will get a shower chair and be able to sit and pivot into tub with min A from her husband.  11/03/20 INITIAL   LONG TERM GOALS:   LTG  Name Target Date Goal status  1 Pt will be able to stand up from chair without HHA and without bracing knees against chair/bed to improve strength in bil LE Comments: Eval = needs to brace knees against chair to stand up 12/01/20 INITIAL  2 Pt will be able to ambulate >2000' with RW and without rest break to improve walking endurance Comments: Eval = 693' with RW 12/01/20 INITIAL  3 Pt will be able to ambulate at least 800' for RW and without rest break to improve functional ambulation Comments: Eval = 693' with RW without rest break 12/01/20 INITIAL  4 Pt will be able to don/doff her shoes/braces off at EOB to improve indepence. Comments: Eval =  Husband helps her with getting her shoes and braces and puts them on for her. Pt able to don/doff shoes but due to short legs her legs dangle and pt doesn't feel comfortable. 12/01/20 INITIAL   PLAN: PT frequency: 2x/week  PT duration: 10 weeks  Planned Interventions: Therapeutic exercises, Therapeutic activity, Neuro Muscular re-education, Balance training, Gait training, Patient/Family education, Joint mobilization, Stair training, Orthotic/Fit training, Cryotherapy and Moist heat  Plan for next session: Continue to progress strengthening and balance  exercises.    Kerrie Pleasure, PT 10/25/2020, [facility] [address] [place], [place], [address] Phone: [phone]   Fax:  [phone]  Patient name: Emily Cannon MRN: [id number] DOB: 05/10/1933

## 2020-10-27 ENCOUNTER — Other Ambulatory Visit: Payer: Self-pay

## 2020-10-27 ENCOUNTER — Ambulatory Visit: Payer: Medicare Other | Attending: Neurology

## 2020-10-27 DIAGNOSIS — R2689 Other abnormalities of gait and mobility: Secondary | ICD-10-CM | POA: Insufficient documentation

## 2020-10-27 DIAGNOSIS — M6281 Muscle weakness (generalized): Secondary | ICD-10-CM | POA: Diagnosis present

## 2020-10-27 NOTE — Therapy (Signed)
Outpatient Physical Therapy Treatment Patient Name: Emily Cannon MRN: [id number] DOB:14-Aug-1932, 87 y.o., female Today's Date: 10/27/2020   PT End of Session - 10/27/20 1034    Visit Number 5    Number of Visits 21    Date for PT Re-Evaluation 12/15/20    PT Start Time 1015    PT Stop Time 1100    PT Time Calculation (min) 45 min    Equipment Utilized During Treatment Gait belt    Activity Tolerance Patient tolerated treatment well    Behavior During Therapy WFL for tasks assessed/performed           PCP: Eulas Post, MD Referring Provier: Eulas Post, MD  Referring Diagnosis: R26.9 (ICD-10-CM) - Gait abnormality M35.9,G63 (ICD-10-CM) - 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 Allergic rhinitis, cause unspecified   . Allergy   . Anxiety state, unspecified   . Blood transfusion without reported diagnosis    2020  . Cataract    removed both eyes   . Chronic kidney disease   . Disorder of bone and cartilage, unspecified   . Esophageal candidiasis ([place])   . Foot drop   . Gastric ulcer   . History of CMV   . History of Helicobacter pylori infection   . Hypothyroid   . Irritable bowel syndrome   . Lumbago   . Microscopic polyangiitis ([place])   . Other and unspecified hyperlipidemia   . Perforation of tympanic membrane, unspecified   . Unspecified essential hypertension    Past Surgical History:  Procedure Laterality Date  . ABDOMINAL HYSTERECTOMY    . BIOPSY  01/02/2019   Procedure: BIOPSY;  Surgeon: Yetta Flock, MD;  [facility] ENDOSCOPY;  Service: Gastroenterology;;  . cataract surgery  5/12 and 12/24/2012   both eyes  . COLONOSCOPY    . COLONOSCOPY WITH PROPOFOL N/A 01/02/2019   Procedure: COLONOSCOPY WITH PROPOFOL;  Surgeon: Yetta Flock, MD;  Location: [place];  Service:  Gastroenterology;  Laterality: N/A;  . ESOPHAGOGASTRODUODENOSCOPY (EGD) WITH PROPOFOL N/A 01/02/2019   Procedure: ESOPHAGOGASTRODUODENOSCOPY (EGD) WITH PROPOFOL;  Surgeon: Yetta Flock, MD;  Location: [place];  Service: Gastroenterology;  Laterality: N/A;  . NSVD     x1  . POLYPECTOMY  01/02/2019   Procedure: POLYPECTOMY;  Surgeon: Yetta Flock, MD;  Location: [facility] ENDOSCOPY;  Service: Gastroenterology;;  . POLYPECTOMY    . TONSILLECTOMY    . UPPER GASTROINTESTINAL ENDOSCOPY     Patient Active Problem List   Diagnosis Date Noted  . 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 Peripheral neuropathy 05/08/2020  . Weakness 03/28/2020  . Gait abnormality 03/28/2020  . Paresthesia 03/28/2020  . Left foot drop 03/23/2020  . Heme positive stool   . Benign neoplasm of colon   . Acute blood loss anemia 12/30/2018  . Symptomatic anemia 12/15/2018  . AKI (acute kidney injury) ([place]) 12/15/2018  . Hypomagnesemia 12/15/2018  . Steroid-induced hyperglycemia 12/15/2018  . Microscopic polyangiitis ([place]) 11/13/2018  . Positive P-ANCA titer 10/14/2018  . Viral URI 05/12/2018  . Syncope 06/05/2016  . Unspecified venous (peripheral) insufficiency 11/23/2012  . Dermatitis 11/10/2012  . Hypothyroidism 05/22/2011  . TYMPANIC MEMBRANE PERFORATION, LEFT EAR 08/08/2007  . Osteopenia 07/27/2007  . Hyperlipidemia 05/11/2007  . Anxiety state 05/11/2007  . Essential hypertension 05/11/2007  . ALLERGIC RHINITIS 05/11/2007  .  Irritable bowel syndrome 05/11/2007  . LOW BACK PAIN 05/11/2007  . HYSTERECTOMY, HX OF 05/11/2007    SUBJECTIVE: No new complaints.  Pain: is patient experiencing pain? No.    OBJECTIVE:   Today's Treatment:  10/27/20:   Sit to stand: 10x 12lbs tactile and verbal cues not to use back of knees, pt used back of knees 50% of the time  Bil leg press: 60lbs 2 x 10   Seated HS curls: green band: 2 x 6 R and L  Seated manually resisted  trunk flexion: 5x with isometric hold at end range of flexion    10/25/20  Manually stretched R calf muscles  Grade IV eversion calcaneal tilt  Trial of aircast and tie down ankle brace for ankle stability and reduce foot turning int with gait. Pt demo significant improvement in ability to keep neutral foot alignment with ankle brace plus AFO. Patient given resournces to buy ankle brace  Fwd and bwd walking: black resistance cord: 3x each way, min to mod A without AD but holding onto PT's one UE  Given resources for ankle weights for HEP  10/20/20  SLR: 2 x 10 R and L, 2lbs  SL hip abduction: 2 x 10 R and L, 2lbs  Bridge: 2 x 10  SAQ: 2lbs 10 x 10" holds R and L  Supine double knee to chest: with 2lbs on each ankle: 2 x 10  Bil leg press: 70lbs 2 x 5; 50lbs 10x   Standing with normal stance on floor: 60", head turns: horizontal 10x, vertical 10x  Standing fwd step and back: 10x R and L with Min A  Patient Education:  Education details: Educated pt and spouse on how to properly don/doff ankle brace. Do proximal strap first then medial (tight) and lateral (without tension). New velcro strap placed on shin pad of R AFO as it was coming off. Person educated: Patient and Spouse Education method: Explanation Education comprehension: verbalized understanding   Home Exercise Program: Access Code: [email or web address] URL: [email or web address] Date: 10/13/2020 Prepared by: Markus Jarvis  Exercises Straight Leg Raise - 1 x daily - 7 x weekly - 2 sets - 10 reps Supine Bridge - 1 x daily - 7 x weekly - 2 sets - 10 reps Sidelying Hip Abduction - 1 x daily - 7 x weekly - 2 sets - 10 reps Seated Knee Extension with Resistance - 1 x daily - 7 x weekly - 2 sets - 10 reps Seated Hamstring Curl with Anchored Resistance - 1 x daily - 7 x weekly - 2 sets - 10 reps   Assessment: Clinical impression:  Pt continues to push the back of the knees with sit to stand transfers due to poor core  strength and poor quad strength. Tolerated progression well. Will continue to benefit from strengthening to improve functional transfers. Pt brought ankle brace which significantly helps pt from rolling L foot in. Rehab potential: Good  Clinical decision making: Stable/uncomplicated  Evaluation complexity: Moderate   Goals: Goals reviewed with patient? Yes  SHORT TERM GOALS:  STG Name Target Date Goal status  1 Pt will be be able to ambulate 1200' with RW to improve walking endurance Comments: Eval = 693' with RW 11/03/20 INITIAL  2 Pt will demo compliance with HEP to improve self management of symptoms. Comments: Eval = not issued 11/03/20 INITIAL  3 Pt will get a shower chair and be able to sit and pivot into tub with min A from her husband.  11/03/20  INITIAL   LONG TERM GOALS:   LTG Name Target Date Goal status  1 Pt will be able to stand up from chair without HHA and without bracing knees against chair/bed to improve strength in bil LE Comments: Eval = needs to brace knees against chair to stand up 12/01/20 INITIAL  2 Pt will be able to ambulate >2000' with RW and without rest break to improve walking endurance Comments: Eval = 693' with RW 12/01/20 INITIAL  3 Pt will be able to ambulate at least 800' for RW and without rest break to improve functional ambulation Comments: Eval = 693' with RW without rest break 12/01/20 INITIAL  4 Pt will be able to don/doff her shoes/braces off at EOB to improve indepence. Comments: Eval =  Husband helps her with getting her shoes and braces and puts them on for her. Pt able to don/doff shoes but due to short legs her legs dangle and pt doesn't feel comfortable. 12/01/20 INITIAL   PLAN: PT frequency: 2x/week  PT duration: 10 weeks  Planned Interventions: Therapeutic exercises, Therapeutic activity, Neuro Muscular re-education, Balance training, Gait training, Patient/Family education, Joint mobilization, Stair training, Orthotic/Fit training,  Cryotherapy and Moist heat  Plan for next session: Continue to progress strengthening and balance exercises.    Kerrie Pleasure, PT 10/27/2020, [facility] [address] [place], [place], [address] Phone: [phone]   Fax:  [phone]  Patient name: Emily Cannon MRN: [id number] DOB: 1932/10/17

## 2020-10-30 ENCOUNTER — Other Ambulatory Visit: Payer: Self-pay | Admitting: Family Medicine

## 2020-10-30 ENCOUNTER — Other Ambulatory Visit: Payer: Self-pay | Admitting: Adult Health

## 2020-10-30 DIAGNOSIS — M317 Microscopic polyangiitis: Secondary | ICD-10-CM

## 2020-10-30 NOTE — Progress Notes (Signed)
I connected by phone with Emily Cannon on 10/30/2020, 1:04 PM to discuss the potential use of a new treatment, tixagevimab/cilgavimab, for pre-exposure prophylaxis for prevention of coronavirus disease 2019 (COVID-19) caused by the SARS-CoV-2 virus.  This patient is a 87 y.o. female that meets the FDA criteria for Emergency Use Authorization of tixagevimab/cilgavimab for pre-exposure prophylaxis of COVID-19 disease. Pt meets following criteria:  Age >12 yr and weight > 40kg  Not currently infected with SARS-CoV-2 and has no known recent exposure to an individual infected with SARS-CoV-2 AND o Who has moderate to severe immune compromise due to a medical condition or receipt of immunosuppressive medications or treatments and may not mount an adequate immune response to COVID-19 vaccination or  o Vaccination with any available COVID-19 vaccine, according to the approved or authorized schedule, is not recommended due to a history of severe adverse reaction (e.g., severe allergic reaction) to a COVID-19 vaccine(s) and/or COVID-19 vaccine component(s).  o Patient meets the following definition of mod-severe immune compromised status: 8. Other groups not previously mentioned: 1. Prednisone of 20mg  or higher for greater than 2 weeks  I have spoken and communicated the following to the patient or parent/caregiver regarding COVID monoclonal antibody treatment:  1. FDA has authorized the emergency use of tixagevimab/cilgavimab for the pre-exposure prophylaxis of COVID-19 in patients with moderate-severe immunocompromised status, who meet above EUA criteria.  2. The significant known and potential risks and benefits of COVID monoclonal antibody, and the extent to which such potential risks and benefits are unknown.  3. Information on available alternative treatments and the risks and benefits of those alternatives, including clinical trials.  4. The patient or parent/caregiver has the option to accept or  refuse COVID monoclonal antibody treatment.  After reviewing this information with the patient, agree to receive tixagevimab/cilgavimab.  Patient's husband elected to call [facility] to schedule directly with their scheduler.    Scot Dock, NP, 10/30/2020, 1:04 PM

## 2020-10-31 ENCOUNTER — Encounter: Payer: Self-pay | Admitting: Family Medicine

## 2020-10-31 ENCOUNTER — Other Ambulatory Visit: Payer: Self-pay

## 2020-10-31 ENCOUNTER — Telehealth: Payer: Self-pay | Admitting: Adult Health

## 2020-10-31 ENCOUNTER — Ambulatory Visit: Payer: Medicare Other | Admitting: Family Medicine

## 2020-10-31 VITALS — BP 122/58 | HR 98 | Temp 97.7°F

## 2020-10-31 DIAGNOSIS — I10 Essential (primary) hypertension: Secondary | ICD-10-CM | POA: Diagnosis not present

## 2020-10-31 MED ORDER — HYDRALAZINE HCL 25 MG PO TABS: 25.0000 mg | ORAL_TABLET | Freq: Three times a day (TID) | ORAL | 0 refills | Status: DC

## 2020-10-31 NOTE — Patient Instructions (Signed)
Reduce the Hydralazine to 25 mg three times daily  Set up one week follow up.  We will plan to continue with tapering at that time.

## 2020-10-31 NOTE — Telephone Encounter (Signed)
I returned Aleynah's husbands call and gave him the number for [facility] again, since he misplaced it.  [phone].  Wilber Bihari, NP

## 2020-10-31 NOTE — Progress Notes (Signed)
Established Patient Office Visit  Subjective:  Patient ID: Emily Cannon, female    DOB: October 13, 1932  Age: 87 y.o. MRN: [id number]  CC:  Chief Complaint  Patient presents with  . Follow-up    BP medication     HPI Emily Cannon presents for follow-up regarding her blood pressure medications.  She has history of microscopic polyangiitis which was diagnosed couple years ago.  Fortunately her ANCA positive vasculitis seems to be relatively stable.  Her renal function has recovered.  She is seeing a rheumatologist over at [facility] and they are in process of tapering her prednisone.  She was just recently started on Tavneos.   She does have neuropathy involving her lower extremities and has bilateral AFO braces and has had extensive physical therapy.  Ambulating with a walker fairly well.  I received call couple weeks ago from her rheumatologist at [facility] regarding possible tapering off of hydralazine.  There is some concern that this could be related to her neuropathy symptoms and vasculitis issue.  She has had very challenging to manage hypertension in the past.  She does remain on amlodipine and metoprolol.  She has had previous reported "swelling of her gums and throat" with valsartan.  Not clear if this was angioedema.  At one point she was taking Aldactone  Past Medical History:  Diagnosis Date  . Allergic rhinitis, cause unspecified   . Allergy   . Anxiety state, unspecified   . Blood transfusion without reported diagnosis    2020  . Cataract    removed both eyes   . Chronic kidney disease   . Disorder of bone and cartilage, unspecified   . Esophageal candidiasis ([place])   . Foot drop   . Gastric ulcer   . History of CMV   . History of Helicobacter pylori infection   . Hypothyroid   . Irritable bowel syndrome   . Lumbago   . Microscopic polyangiitis ([place])   . Other and unspecified hyperlipidemia   . Perforation of tympanic membrane, unspecified   . Unspecified  essential hypertension     Past Surgical History:  Procedure Laterality Date  . ABDOMINAL HYSTERECTOMY    . BIOPSY  01/02/2019   Procedure: BIOPSY;  Surgeon: Yetta Flock, MD;  Location: [facility] ENDOSCOPY;  Service: Gastroenterology;;  . cataract surgery  5/12 and 12/24/2012   both eyes  . COLONOSCOPY    . COLONOSCOPY WITH PROPOFOL N/A 01/02/2019   Procedure: COLONOSCOPY WITH PROPOFOL;  Surgeon: Yetta Flock, MD;  Location: [place];  Service: Gastroenterology;  Laterality: N/A;  . ESOPHAGOGASTRODUODENOSCOPY (EGD) WITH PROPOFOL N/A 01/02/2019   Procedure: ESOPHAGOGASTRODUODENOSCOPY (EGD) WITH PROPOFOL;  Surgeon: Yetta Flock, MD;  Location: [place];  Service: Gastroenterology;  Laterality: N/A;  . NSVD     x1  . POLYPECTOMY  01/02/2019   Procedure: POLYPECTOMY;  Surgeon: Yetta Flock, MD;  Location: [facility] ENDOSCOPY;  Service: Gastroenterology;;  . POLYPECTOMY    . TONSILLECTOMY    . UPPER GASTROINTESTINAL ENDOSCOPY      Family History  Problem Relation Age of Onset  . Lung cancer Mother 86  . Lung disease Father 94  . COPD Other        sibling  . Colon cancer Neg Hx   . Colon polyps Neg Hx   . Esophageal cancer Neg Hx   . Rectal cancer Neg Hx   . Stomach cancer Neg Hx     Social History   Socioeconomic  History  . Marital status: Married    Spouse name: Jeneen Rinks  . Number of children: 2  . Years of education: 70  . Highest education level: High school graduate  Occupational History  . Occupation: retired Research scientist (physical sciences)  Tobacco Use  . Smoking status: Never Smoker  . Smokeless tobacco: Never Used  Vaping Use  . Vaping Use: Never used  Substance and Sexual Activity  . Alcohol use: No  . Drug use: No  . Sexual activity: Not Currently  Other Topics Concern  . Not on file  Social History Narrative   Lives at home with her husband.   Right-handed.   1-2 cups coffee daily, occasional tea or soda.   Social Determinants of Health   Financial  Resource Strain: Not on file  Food Insecurity: Not on file  Transportation Needs: Not on file  Physical Activity: Not on file  Stress: Not on file  Social Connections: Not on file  Intimate Partner Violence: Not on file    Outpatient Medications Prior to Visit  Medication Sig Dispense Refill  . amLODipine (NORVASC) 10 MG tablet TAKE ONE TABLET BY MOUTH DAILY 90 tablet 3  . atorvastatin (LIPITOR) 20 MG tablet TAKE ONE TABLET BY MOUTH DAILY 90 tablet 3  . Avacopan (TAVNEOS) 10 MG CAPS Take 3 capsules by mouth twice daily with meals.    . calcitRIOL (ROCALTROL) 0.25 MCG capsule Take 0.25 mcg by mouth every Monday, Wednesday, and Friday.     . clotrimazole-betamethasone (LOTRISONE) cream Apply 1 application topically 2 (two) times daily. 30 g 0  . esomeprazole (NEXIUM) 40 MG capsule TAKE ONE CAPSULE BY MOUTH DAILY 30 capsule 3  . furosemide (LASIX) 40 MG tablet Take 0.5 tablets (20 mg total) by mouth 3 (three) times a week. Monday, Wed, Friday (Patient taking differently: Take 40 mg by mouth every Monday, Wednesday, and Friday.) 30 tablet   . gabapentin (NEURONTIN) 100 MG capsule Take 2 capsules by mouth at night for neuropathy pain 180 capsule 3  . levothyroxine (SYNTHROID) 50 MCG tablet TAKE ONE TABLET BY MOUTH EVERY MORNING BEFORE BREAKFAST 90 tablet 1  . metoprolol succinate (TOPROL-XL) 50 MG 24 hr tablet TAKE ONE TABLET BY MOUTH TWICE A DAY 180 tablet 0  . Multiple Vitamins-Minerals (CENTRUM SILVER 50+WOMEN) TABS Take 1 tablet by mouth daily with breakfast.    . polyethylene glycol (MIRALAX) 17 g packet Take as directed twice daily and titrate as needed 14 each 0  . predniSONE (DELTASONE) 10 MG tablet Take 10 mg by mouth daily with breakfast. Taking 2.5 tablets daily through 10/01/20 then 2 tablets daily through 10/08/20. They will then be further instructed by Dr. Baxter Flattery at [facility].    . traMADol (ULTRAM) 50 MG tablet TAKE ONE TABLET BY MOUTH EVERY 6 HOURS AS NEEDED FOR UP TO 5 DAYS 30  tablet 0  . valACYclovir (VALTREX) 1000 MG tablet Take 1,000 mg by mouth 3 (three) times daily. Started 03/27/20 - taking for 7 days.    . hydrALAZINE (APRESOLINE) 50 MG tablet TAKE ONE TABLET BY MOUTH THREE TIMES A DAY 270 tablet 0   No facility-administered medications prior to visit.    Allergies  Allergen Reactions  . Alendronate Sodium Shortness Of Breath  . Amoxicillin Diarrhea    Severe diarrhea  . Codeine Nausea Only  . Valsartan Swelling    Gums/throat swell    ROS Review of Systems  Constitutional: Positive for fatigue. Negative for chills and fever.  Eyes: Negative for visual disturbance.  Respiratory: Negative for cough, chest tightness, shortness of breath and wheezing.   Cardiovascular: Negative for chest pain, palpitations and leg swelling.  Neurological: Negative for dizziness, seizures, syncope, weakness, light-headedness and headaches.      Objective:    Physical Exam Vitals reviewed.  Constitutional:      Appearance: Normal appearance.  Cardiovascular:     Rate and Rhythm: Normal rate and regular rhythm.  Pulmonary:     Effort: Pulmonary effort is normal.     Breath sounds: Normal breath sounds.  Musculoskeletal:     Cervical back: Neck supple.     Right lower leg: No edema.     Left lower leg: No edema.  Neurological:     Mental Status: She is alert.     BP (!) 132/58 (BP Location: Left Arm, Patient Position: Sitting, Cuff Size: Normal)   Pulse 98   Temp 97.7 F (36.5 C) (Oral)   SpO2 96%  Wt Readings from Last 3 Encounters:  09/25/20 110 lb (49.9 kg)  08/28/20 110 lb (49.9 kg)  08/22/20 110 lb 9.6 oz (50.2 kg)     Health Maintenance Due  Topic Date Due  . TETANUS/TDAP  07/28/2019  . COVID-19 Vaccine (3 - Booster) 03/19/2020    There are no preventive care reminders to display for this patient.  Lab Results  Component Value Date   TSH 3.390 07/18/2020   Lab Results  Component Value Date   WBC 14.5 (H) 03/24/2020   HGB 12.3  03/24/2020   HCT 36.5 03/24/2020   MCV 90.1 03/24/2020   PLT 279 03/24/2020   Lab Results  Component Value Date   NA 138 03/24/2020   K 3.9 03/24/2020   CO2 30 03/24/2020   GLUCOSE 102 (H) 03/24/2020   BUN 22 03/24/2020   CREATININE 1.43 (H) 03/24/2020   BILITOT 0.5 03/24/2020   ALKPHOS 101 01/17/2020   AST 16 03/24/2020   ALT 16 03/24/2020   PROT 6.8 07/18/2020   ALBUMIN 4.3 01/17/2020   CALCIUM 9.5 03/24/2020   ANIONGAP 7 01/03/2019   GFR 32.78 (L) 01/17/2020   Lab Results  Component Value Date   CHOL 148 01/17/2020   Lab Results  Component Value Date   HDL 31.30 (L) 01/17/2020   Lab Results  Component Value Date   LDLCALC 65 09/11/2018   Lab Results  Component Value Date   TRIG (H) 01/17/2020    533.0 Triglyceride is over 400; calculations on Lipids are invalid.   Lab Results  Component Value Date   CHOLHDL 5 01/17/2020   Lab Results  Component Value Date   HGBA1C 5.7 (H) 07/18/2020      Assessment & Plan:   Problem List Items Addressed This Visit      Unprioritized   Essential hypertension - Primary   Relevant Medications   hydrALAZINE (APRESOLINE) 25 MG tablet    We discussed tapering off of hydralazine.  We discussed importance of tapering to avoid rebound hypertension.  We will try reducing her hydralazine from 50 to 25 mg 3 times daily and reassess in 1 week.  If stable at that time continue further tapering.  We may have to add back additional medication if her blood pressure starts to climb as we come off the hydralazine.  She is encouraged to continue with her amlodipine and metoprolol in the meantime  Meds ordered this encounter  Medications  . hydrALAZINE (APRESOLINE) 25 MG tablet    Sig: Take 1 tablet (25 mg total)  by mouth 3 (three) times daily.    Dispense:  60 tablet    Refill:  0    Follow-up: Return in about 1 week (around 11/07/2020).    Carolann Littler, MD

## 2020-11-01 ENCOUNTER — Ambulatory Visit: Payer: Medicare Other

## 2020-11-01 ENCOUNTER — Other Ambulatory Visit: Payer: Self-pay | Admitting: Family Medicine

## 2020-11-01 DIAGNOSIS — M6281 Muscle weakness (generalized): Secondary | ICD-10-CM | POA: Diagnosis not present

## 2020-11-01 DIAGNOSIS — R2689 Other abnormalities of gait and mobility: Secondary | ICD-10-CM

## 2020-11-01 NOTE — Therapy (Signed)
Outpatient Physical Therapy Treatment Patient Name: Emily Cannon MRN: [id number] DOB:[birth year], 87 y.o., female Today's Date: 11/01/2020   PT End of Session - 11/01/20 1316    Visit Number 6    Number of Visits 21    Date for PT Re-Evaluation 12/15/20    PT Start Time 23    PT Stop Time 1400    PT Time Calculation (min) 45 min    Equipment Utilized During Treatment Gait belt    Activity Tolerance Patient tolerated treatment well    Behavior During Therapy WFL for tasks assessed/performed           PCP: Eulas Post, MD Referring [place]: Marcial Pacas, MD  Referring Diagnosis: R26.9 (ICD-10-CM) - Gait abnormality M35.9,G63 (ICD-10-CM) - 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 ([place] Visit Diagnosis: Muscle weakness (generalized)  Other abnormalities of gait and mobility  Past Medical History:  Diagnosis Date  . Allergic rhinitis, cause unspecified   . Allergy   . Anxiety state, unspecified   . Blood transfusion without reported diagnosis    2020  . Cataract    removed both eyes   . Chronic kidney disease   . Disorder of bone and cartilage, unspecified   . Esophageal candidiasis ([place])   . Foot drop   . Gastric ulcer   . History of CMV   . History of Helicobacter pylori infection   . Hypothyroid   . Irritable bowel syndrome   . Lumbago   . Microscopic polyangiitis ([place])   . Other and unspecified hyperlipidemia   . Perforation of tympanic membrane, unspecified   . Unspecified essential hypertension    Past Surgical History:  Procedure Laterality Date  . ABDOMINAL HYSTERECTOMY    . BIOPSY  01/02/2019   Procedure: BIOPSY;  Surgeon: Yetta Flock, MD;  Location: [facility] ENDOSCOPY;  Service: Gastroenterology;;  . cataract surgery  5/12 and 12/24/2012   both eyes  . COLONOSCOPY    . COLONOSCOPY WITH PROPOFOL N/A 01/02/2019   Procedure: COLONOSCOPY WITH PROPOFOL;  Surgeon: Yetta Flock, MD;  Location: [place];  Service:  Gastroenterology;  Laterality: N/A;  . ESOPHAGOGASTRODUODENOSCOPY (EGD) WITH PROPOFOL N/A 01/02/2019   Procedure: ESOPHAGOGASTRODUODENOSCOPY (EGD) WITH PROPOFOL;  Surgeon: Yetta Flock, MD;  Location: [place];  Service: Gastroenterology;  Laterality: N/A;  . NSVD     x1  . POLYPECTOMY  01/02/2019   Procedure: POLYPECTOMY;  Surgeon: Yetta Flock, MD;  Location: [facility] ENDOSCOPY;  Service: Gastroenterology;;  . POLYPECTOMY    . TONSILLECTOMY    . UPPER GASTROINTESTINAL ENDOSCOPY     Patient Active Problem List   Diagnosis Date Noted  . 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 ([place]) 09/25/2020  . Peripheral neuropathy 05/08/2020  . Weakness 03/28/2020  . Gait abnormality 03/28/2020  . Paresthesia 03/28/2020  . Left foot drop 03/23/2020  . Heme positive stool   . Benign neoplasm of colon   . Acute blood loss anemia 12/30/2018  . Symptomatic anemia 12/15/2018  . AKI (acute kidney injury) ([place]) 12/15/2018  . Hypomagnesemia 12/15/2018  . Steroid-induced hyperglycemia 12/15/2018  . Microscopic polyangiitis ([place]) 11/13/2018  . Positive P-ANCA titer 10/14/2018  . Viral URI 05/12/2018  . Syncope 06/05/2016  . Unspecified venous (peripheral) insufficiency 11/23/2012  . Dermatitis 11/10/2012  . Hypothyroidism 05/22/2011  . TYMPANIC MEMBRANE PERFORATION, LEFT EAR 08/08/2007  . Osteopenia 07/27/2007  . Hyperlipidemia 05/11/2007  . Anxiety state 05/11/2007  . Essential hypertension 05/11/2007  . ALLERGIC RHINITIS 05/11/2007  .  Irritable bowel syndrome 05/11/2007  . LOW BACK PAIN 05/11/2007  . HYSTERECTOMY, HX OF 05/11/2007    SUBJECTIVE: No new complaints.  Pain: is patient experiencing pain? No.    OBJECTIVE:   Today's Treatment:  10/27/20:   Resisted walking: black sport cord: min to mod A from therapist with 1 HHA: bwd 7 laps, fwd 7 laps  Standing passing weight around body: 5lb KB   - wide BOS: 10x cw, 10xccw   - Narrow BOS: 5x cw, 5x ccw   -  partial tandem: 10x cw, 10x ccw R and L   Standing bending over to pick up bean bag from walker basket and throwing them in front: 15 bags normal BOS  Uni leg press: 40lbs 2 x 10 R and L  Standing deadlift: 12 lb weight from 6" box: 2 x 5   10/25/20  Manually stretched R calf muscles  Grade IV eversion calcaneal tilt  Trial of aircast and tie down ankle brace for ankle stability and reduce foot turning int with gait. Pt demo significant improvement in ability to keep neutral foot alignment with ankle brace plus AFO. Patient given resournces to buy ankle brace  Fwd and bwd walking: black resistance cord: 3x each way, min to mod A without AD but holding onto PT's one UE  Given resources for ankle weights for HEP  10/20/20  SLR: 2 x 10 R and L, 2lbs  SL hip abduction: 2 x 10 R and L, 2lbs  Bridge: 2 x 10  SAQ: 2lbs 10 x 10" holds R and L  Supine double knee to chest: with 2lbs on each ankle: 2 x 10  Bil leg press: 70lbs 2 x 5; 50lbs 10x   Standing with normal stance on floor: 60", head turns: horizontal 10x, vertical 10x  Standing fwd step and back: 10x R and L with Min A  Patient Education:  Education details: Educated pt and spouse on how to properly don/doff ankle brace. Do proximal strap first then medial (tight) and lateral (without tension). New velcro strap placed on shin pad of R AFO as it was coming off. Person educated: Patient and Spouse Education method: Explanation Education comprehension: verbalized understanding   Home Exercise Program: Access Code: [email or web address] URL: [email or web address] Date: 10/13/2020 Prepared by: Markus Jarvis  Exercises Straight Leg Raise - 1 x daily - 7 x weekly - 2 sets - 10 reps Supine Bridge - 1 x daily - 7 x weekly - 2 sets - 10 reps Sidelying Hip Abduction - 1 x daily - 7 x weekly - 2 sets - 10 reps Seated Knee Extension with Resistance - 1 x daily - 7 x weekly - 2 sets - 10 reps Seated Hamstring Curl with Anchored Resistance  - 1 x daily - 7 x weekly - 2 sets - 10 reps   Assessment: Clinical impression:  Pt is demonstrating improving in strength in her quads and noticing lesser push back on knees with sit to stand tranfers. Pt was more confident with resisted walking compared to last session;  Rehab potential: Good  Clinical decision making: Stable/uncomplicated  Evaluation complexity: Moderate   Goals: Goals reviewed with patient? Yes  SHORT TERM GOALS:  STG Name Target Date Goal status  1 Pt will be be able to ambulate 1200' with RW to improve walking endurance Comments: Eval = 693' with RW 11/03/20 INITIAL  2 Pt will demo compliance with HEP to improve self management of symptoms. Comments: Eval = not issued 11/03/20 INITIAL  3 Pt will get a shower chair and be able to sit and pivot into tub with min A from her husband.  11/03/20 INITIAL   LONG TERM GOALS:   LTG Name Target Date Goal status  1 Pt will be able to stand up from chair without HHA and without bracing knees against chair/bed to improve strength in bil LE Comments: Eval = needs to brace knees against chair to stand up 12/01/20 INITIAL  2 Pt will be able to ambulate >2000' with RW and without rest break to improve walking endurance Comments: Eval = 693' with RW 12/01/20 INITIAL  3 Pt will be able to ambulate at least 800' for RW and without rest break to improve functional ambulation Comments: Eval = 693' with RW without rest break 12/01/20 INITIAL  4 Pt will be able to don/doff her shoes/braces off at EOB to improve indepence. Comments: Eval =  Husband helps her with getting her shoes and braces and puts them on for her. Pt able to don/doff shoes but due to short legs her legs dangle and pt doesn't feel comfortable. 12/01/20 INITIAL   PLAN: PT frequency: 2x/week  PT duration: 10 weeks  Planned Interventions: Therapeutic exercises, Therapeutic activity, Neuro Muscular re-education, Balance training, Gait training, Patient/Family  education, Joint mobilization, Stair training, Orthotic/Fit training, Cryotherapy and Moist heat  Plan for next session: Continue to progress strengthening and balance exercises.    Kerrie Pleasure, PT 11/01/2020, [facility] [address] [place], [place], [address] Phone: [phone]   Fax:  [phone]  Patient name: Emily Cannon MRN: [id number] DOB: 03/07/1933

## 2020-11-03 ENCOUNTER — Ambulatory Visit: Payer: Medicare Other

## 2020-11-03 ENCOUNTER — Other Ambulatory Visit: Payer: Self-pay

## 2020-11-03 DIAGNOSIS — R2689 Other abnormalities of gait and mobility: Secondary | ICD-10-CM

## 2020-11-03 DIAGNOSIS — M6281 Muscle weakness (generalized): Secondary | ICD-10-CM | POA: Diagnosis not present

## 2020-11-03 NOTE — Therapy (Signed)
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 Allergic rhinitis, cause unspecified   . Allergy   . Anxiety state, unspecified   . Blood transfusion without reported diagnosis    2020  . Cataract    removed both eyes   . Chronic kidney disease   . Disorder of bone and cartilage, unspecified   . Esophageal candidiasis ([place])   . Foot drop   . Gastric ulcer   . History of CMV   . History of Helicobacter pylori infection   . Hypothyroid   . Irritable bowel syndrome   . Lumbago   . Microscopic polyangiitis ([place])   . Other and unspecified hyperlipidemia   . Perforation of tympanic membrane, unspecified   . Unspecified essential hypertension    Past Surgical History:  Procedure Laterality Date  . ABDOMINAL HYSTERECTOMY    . BIOPSY  01/02/2019   Procedure: BIOPSY;  Surgeon: Yetta Flock, MD;  Location: [facility] ENDOSCOPY;  Service: Gastroenterology;;  . cataract surgery  5/12 and 12/24/2012   both eyes  . COLONOSCOPY    . COLONOSCOPY WITH PROPOFOL N/A 01/02/2019   Procedure: COLONOSCOPY WITH PROPOFOL;  Surgeon: Yetta Flock, MD;  Location: [place];  Service: Gastroenterology;  Laterality: N/A;  . ESOPHAGOGASTRODUODENOSCOPY (EGD) WITH PROPOFOL N/A  01/02/2019   Procedure: ESOPHAGOGASTRODUODENOSCOPY (EGD) WITH PROPOFOL;  Surgeon: Yetta Flock, MD;  Location: [place];  Service: Gastroenterology;  Laterality: N/A;  . NSVD     x1  . POLYPECTOMY  01/02/2019   Procedure: POLYPECTOMY;  Surgeon: Yetta Flock, MD;  Location: [facility] ENDOSCOPY;  Service: Gastroenterology;;  . POLYPECTOMY    . TONSILLECTOMY    . UPPER GASTROINTESTINAL ENDOSCOPY     Patient Active Problem List   Diagnosis Date Noted  . 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 Peripheral neuropathy 05/08/2020  . Weakness 03/28/2020  . Gait abnormality 03/28/2020  . Paresthesia 03/28/2020  . Left foot drop 03/23/2020  . Heme positive stool   . Benign neoplasm of colon   . Acute blood loss anemia 12/30/2018  . Symptomatic anemia 12/15/2018  . AKI (acute kidney injury) ([place]) 12/15/2018  . Hypomagnesemia 12/15/2018  . Steroid-induced hyperglycemia 12/15/2018  . Microscopic polyangiitis ([place]) 11/13/2018  . Positive P-ANCA titer 10/14/2018  . Viral URI 05/12/2018  . Syncope 06/05/2016  . Unspecified venous (peripheral) insufficiency 11/23/2012  . Dermatitis 11/10/2012  . Hypothyroidism 05/22/2011  . TYMPANIC MEMBRANE PERFORATION, LEFT EAR 08/08/2007  . Osteopenia 07/27/2007  . Hyperlipidemia 05/11/2007  . Anxiety state 05/11/2007  . Essential hypertension 05/11/2007  . ALLERGIC RHINITIS 05/11/2007  . Irritable bowel syndrome 05/11/2007  . LOW BACK PAIN 05/11/2007  . HYSTERECTOMY, HX OF 05/11/2007    SUBJECTIVE: I was little  sore after last time but not too bad.  Pain: is patient experiencing pain? No.    OBJECTIVE:   Today's Treatment:  11/03/20  Scifit: manual level 10 for 6'  Fwd step up: 8" box with bil HHA in parallel bars: 10x R and L  Fwd walking: 9 feet x 4 with wide BOS (foam beam between legs)  Standing deadlifts: 5lbs KB from 2" box on floor: 10x no Assist required  Dual tasking: sit to stand with  counting backwards by 3s from 70- pt unable to perform physical task as she either stands or sits while trying to perform cognitive tasks. Challenged it to counting bwd by 2s from 16, pt able to perform better physically but required cues to continue to perform sit to stand tasks: without HHA from standard chair ~20-25 reps  Walking fwd with naming Months of th eyear by skipping a month, namking 3 animals that start with D, naming 3 birds that swim on water, naming 3 fruits that are green       10/27/20:   Resisted walking: black sport cord: min to mod A from therapist with 1 HHA: bwd 7 laps, fwd 7 laps  Standing passing weight around body: 5lb KB   - wide BOS: 10x cw, 10xccw   - Narrow BOS: 5x cw, 5x ccw   - partial tandem: 10x cw, 10x ccw R and L   Standing bending over to pick up bean bag from walker basket and throwing them in front: 15 bags normal BOS  Uni leg press: 40lbs 2 x 10 R and L  Standing deadlift: 12 lb weight from 6" box: 2 x 5   10/25/20  Manually stretched R calf muscles  Grade IV eversion calcaneal tilt  Trial of aircast and tie down ankle brace for ankle stability and reduce foot turning int with gait. Pt demo significant improvement in ability to keep neutral foot alignment with ankle brace plus AFO. Patient given resournces to buy ankle brace  Fwd and bwd walking: black resistance cord: 3x each way, min to mod A without AD but holding onto PT's one UE  Given resources for ankle weights for HEP  10/20/20  SLR: 2 x 10 R and L, 2lbs  SL hip abduction: 2 x 10 R and L, 2lbs  Bridge: 2 x 10  SAQ: 2lbs 10 x 10" holds R and L  Supine double knee to chest: with 2lbs on each ankle: 2 x 10  Bil leg press: 70lbs 2 x 5; 50lbs 10x   Standing with normal stance on floor: 60", head turns: horizontal 10x, vertical 10x  Standing fwd step and back: 10x R and L with Min A  Patient Education:  Education details: Educated pt and spouse on how to properly don/doff ankle brace. Do  proximal strap first then medial (tight) and lateral (without tension). New velcro strap placed on shin pad of R AFO as it was coming off. Person educated: Patient and Spouse Education method: Explanation Education comprehension: verbalized understanding   Home Exercise Program: Access Code: [email or web address] URL: [email or web address] Date: 10/13/2020 Prepared by: Markus Jarvis  Exercises Straight Leg Raise - 1 x daily - 7 x weekly - 2 sets - 10 reps Supine Bridge - 1 x daily - 7 x weekly - 2 sets - 10 reps Sidelying Hip Abduction - 1 x daily - 7 x weekly - 2 sets - 10 reps Seated Knee Extension with Resistance - 1 x daily - 7 x weekly -  2 sets - 10 reps Seated Hamstring Curl with Anchored Resistance - 1 x daily - 7 x weekly - 2 sets - 10 reps   Assessment: Clinical impression:  Pt demonstrated significant difficulty with phyiscal tasks when cognitive tasks was involved. Patient will benefit from incorportating dual tasking with physical performance.  Rehab potential: Good  Clinical decision making: Stable/uncomplicated  Evaluation complexity: Moderate   Goals: Goals reviewed with patient? Yes  SHORT TERM GOALS:  STG Name Target Date Goal status  1 Pt will be be able to ambulate 1200' with RW to improve walking endurance Comments: Eval = 693' with RW 11/03/20 INITIAL  2 Pt will demo compliance with HEP to improve self management of symptoms. Comments: Eval = not issued 11/03/20 INITIAL  3 Pt will get a shower chair and be able to sit and pivot into tub with min A from her husband.  11/03/20 INITIAL   LONG TERM GOALS:   LTG Name Target Date Goal status  1 Pt will be able to stand up from chair without HHA and without bracing knees against chair/bed to improve strength in bil LE Comments: Eval = needs to brace knees against chair to stand up 12/01/20 INITIAL  2 Pt will be able to ambulate >2000' with RW and without rest break to improve walking  endurance Comments: Eval = 693' with RW 12/01/20 INITIAL  3 Pt will be able to ambulate at least 800' for RW and without rest break to improve functional ambulation Comments: Eval = 693' with RW without rest break 12/01/20 INITIAL  4 Pt will be able to don/doff her shoes/braces off at EOB to improve indepence. Comments: Eval =  Husband helps her with getting her shoes and braces and puts them on for her. Pt able to don/doff shoes but due to short legs her legs dangle and pt doesn't feel comfortable. 12/01/20 INITIAL   PLAN: PT frequency: 2x/week  PT duration: 10 weeks  Planned Interventions: Therapeutic exercises, Therapeutic activity, Neuro Muscular re-education, Balance training, Gait training, Patient/Family education, Joint mobilization, Stair training, Orthotic/Fit training, Cryotherapy and Moist heat  Plan for next session: Incorporate dual tasking with exercises, Continue to progress strengthening and balance exercises.    Kerrie Pleasure, PT 11/03/2020, [place] [address] [place] [address] [place], [place], [address] Phone: [phone]   Fax:  [phone]  Patient name: CRYSTINA BORRAYO MRN: [id number] DOB: 1933-03-21

## 2020-11-07 ENCOUNTER — Ambulatory Visit: Payer: Medicare Other | Admitting: Family Medicine

## 2020-11-07 ENCOUNTER — Encounter: Payer: Self-pay | Admitting: Family Medicine

## 2020-11-07 ENCOUNTER — Other Ambulatory Visit: Payer: Self-pay

## 2020-11-07 VITALS — BP 124/60 | HR 70 | Temp 98.0°F

## 2020-11-07 DIAGNOSIS — H00011 Hordeolum externum right upper eyelid: Secondary | ICD-10-CM

## 2020-11-07 DIAGNOSIS — I10 Essential (primary) hypertension: Secondary | ICD-10-CM | POA: Diagnosis not present

## 2020-11-07 DIAGNOSIS — M317 Microscopic polyangiitis: Secondary | ICD-10-CM

## 2020-11-07 NOTE — Patient Instructions (Signed)
Reduce the Hydralazine to 25 mg twice for 5 days and then STOP the Hydralazine  Monitor blood pressure and be in touch if systolic BP (top number) consistently > 150.    Stye A stye, also known as a hordeolum, is a bump that forms on an eyelid. It may look like a pimple next to the eyelash. A stye can form inside the eyelid (internal stye) or outside the eyelid (external stye). A stye can cause redness, swelling, and pain on the eyelid. Styes are very common. Anyone can get them at any age. They usually occur in just one eye, but you may have more than one in either eye. What are the causes? A stye is caused by an infection. The infection is almost always caused by bacteria called Staphylococcus aureus. This is a common type of bacteria that lives on the skin. An internal stye may result from an infected oil-producing gland inside the eyelid. An external stye may be caused by an infection at the base of the eyelash (hair follicle). What increases the risk? You are more likely to develop a stye if:  You have had a stye before.  You have any of these conditions: ? Diabetes. ? Red, itchy, inflamed eyelids (blepharitis). ? A skin condition such as seborrheic dermatitis or rosacea. ? High fat levels in your blood (lipids). What are the signs or symptoms? The most common symptom of a stye is eyelid pain. Internal styes are more painful than external styes. Other symptoms may include:  Painful swelling of your eyelid.  A scratchy feeling in your eye.  Tearing and redness of your eye.  Pus draining from the stye.   How is this diagnosed? Your health care provider may be able to diagnose a stye just by examining your eye. The health care provider may also check to make sure:  You do not have a fever or other signs of a more serious infection.  The infection has not spread to other parts of your eye or areas around your eye. How is this treated? Most styes will clear up in a few days  without treatment or with warm compresses applied to the area. You may need to use antibiotic drops or ointment to treat an infection. In some cases, if your stye does not heal with routine treatment, your health care provider may drain pus from the stye using a thin blade or needle. This may be done if the stye is large, causing a lot of pain, or affecting your vision. Follow these instructions at home:  Take over-the-counter and prescription medicines only as told by your health care provider. This includes eye drops or ointments.  If you were prescribed an antibiotic medicine, apply or use it as told by your health care provider. Do not stop using the antibiotic even if your condition improves.  Apply a warm, wet cloth (warm compress) to your eye for 5-10 minutes, 4 times a day.  Clean the affected eyelid as directed by your health care provider.  Do not wear contact lenses or eye makeup until your stye has healed.  Do not try to pop or drain the stye.  Do not rub your eye. Contact a health care provider if:  You have chills or a fever.  Your stye does not go away after several days.  Your stye affects your vision.  Your eyeball becomes swollen, red, or painful. Get help right away if:  You have pain when moving your eye around. Summary  A stye is a bump that forms on an eyelid. It may look like a pimple next to the eyelash.  A stye can form inside the eyelid (internal stye) or outside the eyelid (external stye). A stye can cause redness, swelling, and pain on the eyelid.  Your health care provider may be able to diagnose a stye just by examining your eye.  Apply a warm, wet cloth (warm compress) to your eye for 5-10 minutes, 4 times a day. This information is not intended to replace advice given to you by your health care provider. Make sure you discuss any questions you have with your health care provider. Document Revised: 03/23/2020 Document Reviewed:  03/23/2020 Elsevier Patient Education  [place].

## 2020-11-07 NOTE — Progress Notes (Signed)
Established Patient Office Visit  Subjective:  Patient ID: [name], female    DOB: [birth year]  Age: 87 y.o. MRN: [id number]  CC:  Chief Complaint  Patient presents with  . Follow-up    Hypertension, possible stye on R eye    HPI Emily Cannon presents for medical follow-up.  We are in process of tapering down her hydralazine.  Refer to previous note for details  Emily Cannon presents for follow-up regarding her blood pressure medications.  She has history of microscopic polyangiitis which was diagnosed couple years ago.  Fortunately her ANCA positive vasculitis seems to be relatively stable.  Her renal function has recovered.  She is seeing a rheumatologist over at [facility] and they are in process of tapering her prednisone.  She was just recently started on Tavneos.   She does have neuropathy involving her lower extremities and has bilateral AFO braces and has had extensive physical therapy.  Ambulating with a walker fairly well.  I received call couple weeks ago from her rheumatologist at [facility] regarding possible tapering off of hydralazine.  There is some concern that this could be related to her neuropathy symptoms and vasculitis issue.  She has had very challenging to manage hypertension in the past.  She does remain on amlodipine and metoprolol.  She has had previous reported "swelling of her gums and throat" with valsartan.  Not clear if this was angioedema.  At one point she was taking Aldactone  We reduced her hydralazine to 25 mg 3 times daily.  They have not been consistently checking blood pressures at home.  She has no headaches or other new symptoms.  She is getting ready to start new injectable therapy per rheumatology which is [facility]us[facility].        She has new problem of right upper eyelid swelling.  No visual changes.  Mild associated pain.  She states she has no history of stye previously.  No eye drainage.  She has not tried any warm compresses  yet.   Past Medical History:  Diagnosis Date  . Allergic rhinitis, cause unspecified   . Allergy   . Anxiety state, unspecified   . Blood transfusion without reported diagnosis    2020  . Cataract    removed both eyes   . Chronic kidney disease   . Disorder of bone and cartilage, unspecified   . Esophageal candidiasis ([place])   . Foot drop   . Gastric ulcer   . History of CMV   . History of Helicobacter pylori infection   . Hypothyroid   . Irritable bowel syndrome   . Lumbago   . Microscopic polyangiitis ([place])   . Other and unspecified hyperlipidemia   . Perforation of tympanic membrane, unspecified   . Unspecified essential hypertension     Past Surgical History:  Procedure Laterality Date  . ABDOMINAL HYSTERECTOMY    . BIOPSY  01/02/2019   Procedure: BIOPSY;  Surgeon: Yetta Flock, MD;  Location: [facility] ENDOSCOPY;  Service: Gastroenterology;;  . cataract surgery  5/12 and 12/24/2012   both eyes  . COLONOSCOPY    . COLONOSCOPY WITH PROPOFOL N/A 01/02/2019   Procedure: COLONOSCOPY WITH PROPOFOL;  Surgeon: Yetta Flock, MD;  Location: [place];  Service: Gastroenterology;  Laterality: N/A;  . ESOPHAGOGASTRODUODENOSCOPY (EGD) WITH PROPOFOL N/A 01/02/2019   Procedure: ESOPHAGOGASTRODUODENOSCOPY (EGD) WITH PROPOFOL;  Surgeon: Yetta Flock, MD;  Location: [place];  Service: Gastroenterology;  Laterality: N/A;  . NSVD  x1  . POLYPECTOMY  01/02/2019   Procedure: POLYPECTOMY;  Surgeon: Yetta Flock, MD;  Location: [facility] ENDOSCOPY;  Service: Gastroenterology;;  . POLYPECTOMY    . TONSILLECTOMY    . UPPER GASTROINTESTINAL ENDOSCOPY      Family History  Problem Relation Age of Onset  . Lung cancer Mother 47  . Lung disease Father 15  . COPD Other        sibling  . Colon cancer Neg Hx   . Colon polyps Neg Hx   . Esophageal cancer Neg Hx   . Rectal cancer Neg Hx   . Stomach cancer Neg Hx     Social History   Socioeconomic History  .  Marital status: Married    Spouse name: Jeneen Rinks  . Number of children: 2  . Years of education: 35  . Highest education level: High school graduate  Occupational History  . Occupation: retired Research scientist (physical sciences)  Tobacco Use  . Smoking status: Never Smoker  . Smokeless tobacco: Never Used  Vaping Use  . Vaping Use: Never used  Substance and Sexual Activity  . Alcohol use: No  . Drug use: No  . Sexual activity: Not Currently  Other Topics Concern  . Not on file  Social History Narrative   Lives at home with her husband.   Right-handed.   1-2 cups coffee daily, occasional tea or soda.   Social Determinants of Health   Financial Resource Strain: Not on file  Food Insecurity: Not on file  Transportation Needs: Not on file  Physical Activity: Not on file  Stress: Not on file  Social Connections: Not on file  Intimate Partner Violence: Not on file    Outpatient Medications Prior to Visit  Medication Sig Dispense Refill  . amLODipine (NORVASC) 10 MG tablet TAKE ONE TABLET BY MOUTH DAILY 90 tablet 3  . atorvastatin (LIPITOR) 20 MG tablet TAKE ONE TABLET BY MOUTH DAILY 90 tablet 3  . Avacopan (TAVNEOS) 10 MG CAPS Take 3 capsules by mouth twice daily with meals.    . calcitRIOL (ROCALTROL) 0.25 MCG capsule Take 0.25 mcg by mouth every Monday, Wednesday, and Friday.     . clotrimazole-betamethasone (LOTRISONE) cream Apply 1 application topically 2 (two) times daily. 30 g 0  . esomeprazole (NEXIUM) 40 MG capsule TAKE ONE CAPSULE BY MOUTH DAILY 30 capsule 3  . furosemide (LASIX) 40 MG tablet Take 0.5 tablets (20 mg total) by mouth 3 (three) times a week. Monday, Wed, Friday (Patient taking differently: Take 40 mg by mouth every Monday, Wednesday, and Friday.) 30 tablet   . gabapentin (NEURONTIN) 100 MG capsule Take 2 capsules by mouth at night for neuropathy pain 180 capsule 3  . levothyroxine (SYNTHROID) 50 MCG tablet TAKE ONE TABLET BY MOUTH EVERY MORNING BEFORE BREAKFAST 90 tablet 1  .  metoprolol succinate (TOPROL-XL) 50 MG 24 hr tablet TAKE ONE TABLET BY MOUTH TWICE A DAY 180 tablet 0  . Multiple Vitamins-Minerals (CENTRUM SILVER 50+WOMEN) TABS Take 1 tablet by mouth daily with breakfast.    . polyethylene glycol (MIRALAX) 17 g packet Take as directed twice daily and titrate as needed 14 each 0  . predniSONE (DELTASONE) 10 MG tablet Take 10 mg by mouth daily with breakfast. Taking 2.5 tablets daily through 10/01/20 then 2 tablets daily through 10/08/20. They will then be further instructed by Dr. Baxter Flattery at [facility].    . traMADol (ULTRAM) 50 MG tablet TAKE ONE TABLET BY MOUTH EVERY 6 HOURS AS NEEDED FOR UP  TO 5 DAYS 30 tablet 0  . valACYclovir (VALTREX) 1000 MG tablet Take 1,000 mg by mouth 3 (three) times daily. Started 03/27/20 - taking for 7 days.    . hydrALAZINE (APRESOLINE) 25 MG tablet Take 1 tablet (25 mg total) by mouth 3 (three) times daily. 60 tablet 0   No facility-administered medications prior to visit.    Allergies  Allergen Reactions  . Alendronate Sodium Shortness Of Breath  . Amoxicillin Diarrhea    Severe diarrhea  . Codeine Nausea Only  . Valsartan Swelling    Gums/throat swell    ROS Review of Systems  Constitutional: Negative for fatigue.  Eyes: Negative for visual disturbance.  Respiratory: Negative for cough, chest tightness, shortness of breath and wheezing.   Cardiovascular: Negative for chest pain, palpitations and leg swelling.  Neurological: Negative for dizziness, seizures, syncope, weakness, light-headedness and headaches.      Objective:    Physical Exam Constitutional:      Appearance: She is well-developed.  Eyes:     Pupils: Pupils are equal, round, and reactive to light.     Comments: She has some mild swelling and very mild erythema right upper lid laterally.  No vesicles.  Conjunctive appears normal.  Extraocular movements all normal  Neck:     Thyroid: No thyromegaly.     Vascular: No JVD.  Cardiovascular:     Rate  and Rhythm: Normal rate and regular rhythm.     Heart sounds: No gallop.   Pulmonary:     Effort: Pulmonary effort is normal. No respiratory distress.     Breath sounds: Normal breath sounds. No wheezing or rales.  Musculoskeletal:     Cervical back: Neck supple.  Neurological:     Mental Status: She is alert.     BP 124/60 (BP Location: Left Arm, Patient Position: Sitting, Cuff Size: Normal)   Pulse 70   Temp 98 F (36.7 C) (Oral)   SpO2 96%  Wt Readings from Last 3 Encounters:  09/25/20 110 lb (49.9 kg)  08/28/20 110 lb (49.9 kg)  08/22/20 110 lb 9.6 oz (50.2 kg)     Health Maintenance Due  Topic Date Due  . TETANUS/TDAP  07/28/2019  . COVID-19 Vaccine (3 - Booster) 03/19/2020    There are no preventive care reminders to display for this patient.  Lab Results  Component Value Date   TSH 3.390 07/18/2020   Lab Results  Component Value Date   WBC 14.5 (H) 03/24/2020   HGB 12.3 03/24/2020   HCT 36.5 03/24/2020   MCV 90.1 03/24/2020   PLT 279 03/24/2020   Lab Results  Component Value Date   NA 138 03/24/2020   K 3.9 03/24/2020   CO2 30 03/24/2020   GLUCOSE 102 (H) 03/24/2020   BUN 22 03/24/2020   CREATININE 1.43 (H) 03/24/2020   BILITOT 0.5 03/24/2020   ALKPHOS 101 01/17/2020   AST 16 03/24/2020   ALT 16 03/24/2020   PROT 6.8 07/18/2020   ALBUMIN 4.3 01/17/2020   CALCIUM 9.5 03/24/2020   ANIONGAP 7 01/03/2019   GFR 32.78 (L) 01/17/2020   Lab Results  Component Value Date   CHOL 148 01/17/2020   Lab Results  Component Value Date   HDL 31.30 (L) 01/17/2020   Lab Results  Component Value Date   LDLCALC 65 09/11/2018   Lab Results  Component Value Date   TRIG (H) 01/17/2020    533.0 Triglyceride is over 400; calculations on Lipids are invalid.  Lab Results  Component Value Date   CHOLHDL 5 01/17/2020   Lab Results  Component Value Date   HGBA1C 5.7 (H) 07/18/2020      Assessment & Plan:   #1 hypertension.  Initial blood pressure  today 124/60 but repeat by me 138/70.  In process of tapering off hydralazine.  She has history of ANCA positive polyangiitis/vasculitis with concerned this could be related to her hydralazine  -Reduce hydralazine further to 25 mg twice daily for 5 days then discontinue altogether -Monitor home blood pressures closely -Set up 2-week office follow-up.  If blood pressures climbing further at that point consider additional medication  #2 stye involving right upper lid -Recommend warm compresses several times daily.  We explained these are usually self resolving-but can sometimes take several weeks  -Patient had requested referral to ophthalmologist and this does not appear to have been placed a week ago.  Set up referral to ophthalmology for good basic eye exam with her history of autoimmune disease  #3 history of microscopic polyangiitis.  It sounds like her vasculitic problem is relatively stable currently.  Getting ready to start a new injectable as above.  No orders of the defined types were placed in this encounter.   Follow-up: Return in about 2 weeks (around 11/21/2020).    Carolann Littler, MD

## 2020-11-08 ENCOUNTER — Ambulatory Visit: Payer: Medicare Other

## 2020-11-08 DIAGNOSIS — R2689 Other abnormalities of gait and mobility: Secondary | ICD-10-CM

## 2020-11-08 DIAGNOSIS — M6281 Muscle weakness (generalized): Secondary | ICD-10-CM

## 2020-11-08 NOTE — Therapy (Signed)
Outpatient Physical Therapy Treatment Patient Name: Emily Cannon MRN: [id number] DOB:Aug 30, 1932, 87 y.o., female Today's Date: 11/08/2020   PT End of Session - 11/08/20 1455    Visit Number 8    Number of Visits 21    Date for PT Re-Evaluation 12/15/20    PT Start Time 5277    PT Stop Time 1530    PT Time Calculation (min) 45 min    Equipment Utilized During Treatment Gait belt    Activity Tolerance Patient tolerated treatment well    Behavior During Therapy WFL for tasks assessed/performed           PCP: Eulas Post, MD Referring Provier: Marcial Pacas, MD  Referring Diagnosis: R26.9 (ICD-10-CM) - Gait abnormality M35.9,G63 (ICD-10-CM) - 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 Allergic rhinitis, cause unspecified   . Allergy   . Anxiety state, unspecified   . Blood transfusion without reported diagnosis    2020  . Cataract    removed both eyes   . Chronic kidney disease   . Disorder of bone and cartilage, unspecified   . Esophageal candidiasis ([place])   . Foot drop   . Gastric ulcer   . History of CMV   . History of Helicobacter pylori infection   . Hypothyroid   . Irritable bowel syndrome   . Lumbago   . Microscopic polyangiitis ([place])   . Other and unspecified hyperlipidemia   . Perforation of tympanic membrane, unspecified   . Unspecified essential hypertension    Past Surgical History:  Procedure Laterality Date  . ABDOMINAL HYSTERECTOMY    . BIOPSY  01/02/2019   Procedure: BIOPSY;  Surgeon: Yetta Flock, MD;  Location: [facility] ENDOSCOPY;  Service: Gastroenterology;;  . cataract surgery  5/12 and 12/24/2012   both eyes  . COLONOSCOPY    . COLONOSCOPY WITH PROPOFOL N/A 01/02/2019   Procedure: COLONOSCOPY WITH PROPOFOL;  Surgeon: Yetta Flock, MD;  Location: [place];  Service:  Gastroenterology;  Laterality: N/A;  . ESOPHAGOGASTRODUODENOSCOPY (EGD) WITH PROPOFOL N/A 01/02/2019   Procedure: ESOPHAGOGASTRODUODENOSCOPY (EGD) WITH PROPOFOL;  Surgeon: Yetta Flock, MD;  Location: [place];  Service: Gastroenterology;  Laterality: N/A;  . NSVD     x1  . POLYPECTOMY  01/02/2019   Procedure: POLYPECTOMY;  Surgeon: Yetta Flock, MD;  Location: [facility] ENDOSCOPY;  Service: Gastroenterology;;  . POLYPECTOMY    . TONSILLECTOMY    . UPPER GASTROINTESTINAL ENDOSCOPY     Patient Active Problem List   Diagnosis Date Noted  . 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 Peripheral neuropathy 05/08/2020  . Weakness 03/28/2020  . Gait abnormality 03/28/2020  . Paresthesia 03/28/2020  . Left foot drop 03/23/2020  . Heme positive stool   . Benign neoplasm of colon   . Acute blood loss anemia 12/30/2018  . Symptomatic anemia 12/15/2018  . AKI (acute kidney injury) ([place]) 12/15/2018  . Hypomagnesemia 12/15/2018  . Steroid-induced hyperglycemia 12/15/2018  . Microscopic polyangiitis ([place]) 11/13/2018  . Positive P-ANCA titer 10/14/2018  . Viral URI 05/12/2018  . Syncope 06/05/2016  . Unspecified venous (peripheral) insufficiency 11/23/2012  . Dermatitis 11/10/2012  . Hypothyroidism 05/22/2011  . TYMPANIC MEMBRANE PERFORATION, LEFT EAR 08/08/2007  . Osteopenia 07/27/2007  . Hyperlipidemia 05/11/2007  . Anxiety state 05/11/2007  . Essential hypertension 05/11/2007  . ALLERGIC RHINITIS 05/11/2007  .  Irritable bowel syndrome 05/11/2007  . LOW BACK PAIN 05/11/2007  . HYSTERECTOMY, HX OF 05/11/2007    SUBJECTIVE: I am getting the new shot at end of the week. It is new trial medication. I have to get these shots every 6 months.  Pain: is patient experiencing pain? No.    OBJECTIVE:   Today's Treatment:  11/08/20: Scifit: manual level 10 for 6' Standing box taps: 8" box: 10x R and L , no HHA Stepping up on 8" box: one HHA: 10x R  and L Sit to stand from 14" box: needed min A to stand up but able to control 90% with sitting down: 2 x 5 Stepping fwd over 2" foam beams (4" wide) without HHA: 3 beams: 2x with leading with R LE, 2x with leading with L LE Standing on foam, bending to pick up bean bag from 8" box and then throwing them underhand: 15x no HHA Standing deadlift: 15lbs from floor: 5x  11/03/20  Scifit: manual level 10 for 6'  Fwd step up: 8" box with bil HHA in parallel bars: 10x R and L  Fwd walking: 9 feet x 4 with wide BOS (foam beam between legs)  Standing deadlifts: 5lbs KB from 2" box on floor: 10x no Assist required  Dual tasking: sit to stand with counting backwards by 3s from 70- pt unable to perform physical task as she either stands or sits while trying to perform cognitive tasks. Challenged it to counting bwd by 2s from 33, pt able to perform better physically but required cues to continue to perform sit to stand tasks: without HHA from standard chair ~20-25 reps  Walking fwd with naming Months of th eyear by skipping a month, namking 3 animals that start with D, naming 3 birds that swim on water, naming 3 fruits that are green   10/27/20:   Resisted walking: black sport cord: min to mod A from therapist with 1 HHA: bwd 7 laps, fwd 7 laps  Standing passing weight around body: 5lb KB   - wide BOS: 10x cw, 10xccw   - Narrow BOS: 5x cw, 5x ccw   - partial tandem: 10x cw, 10x ccw R and L   Standing bending over to pick up bean bag from walker basket and throwing them in front: 15 bags normal BOS  Uni leg press: 40lbs 2 x 10 R and L  Standing deadlift: 12 lb weight from 6" box: 2 x 5    Patient Education:  Education details: Educated pt and spouse on how to properly don/doff ankle brace. Do proximal strap first then medial (tight) and lateral (without tension). New velcro strap placed on shin pad of R AFO as it was coming off. Person educated: Patient and Spouse Education method: Explanation Education  comprehension: verbalized understanding   Home Exercise Program: Access Code: [email or web address] URL: [email or web address] Date: 10/13/2020 Prepared by: Markus Jarvis  Exercises Straight Leg Raise - 1 x daily - 7 x weekly - 2 sets - 10 reps Supine Bridge - 1 x daily - 7 x weekly - 2 sets - 10 reps Sidelying Hip Abduction - 1 x daily - 7 x weekly - 2 sets - 10 reps Seated Knee Extension with Resistance - 1 x daily - 7 x weekly - 2 sets - 10 reps Seated Hamstring Curl with Anchored Resistance - 1 x daily - 7 x weekly - 2 sets - 10 reps   Assessment: Clinical impression:  Pt continues to show improvement with functional  balance, functional bending and function lifting.   Rehab potential: Good  Clinical decision making: Stable/uncomplicated  Evaluation complexity: Moderate   Goals: Goals reviewed with patient? Yes  SHORT TERM GOALS:  STG Name Target Date Goal status  1 Pt will be be able to ambulate 1200' with RW to improve walking endurance Comments: Eval = 693' with RW 11/03/20 INITIAL  2 Pt will demo compliance with HEP to improve self management of symptoms. Comments: Eval = not issued 11/03/20 INITIAL  3 Pt will get a shower chair and be able to sit and pivot into tub with min A from her husband.  11/03/20 INITIAL   LONG TERM GOALS:   LTG Name Target Date Goal status  1 Pt will be able to stand up from chair without HHA and without bracing knees against chair/bed to improve strength in bil LE Comments: Eval = needs to brace knees against chair to stand up 12/01/20 INITIAL  2 Pt will be able to ambulate >2000' with RW and without rest break to improve walking endurance Comments: Eval = 693' with RW 12/01/20 INITIAL  3 Pt will be able to ambulate at least 800' for RW and without rest break to improve functional ambulation Comments: Eval = 693' with RW without rest break 12/01/20 INITIAL  4 Pt will be able to don/doff her shoes/braces off at EOB to improve  indepence. Comments: Eval =  Husband helps her with getting her shoes and braces and puts them on for her. Pt able to don/doff shoes but due to short legs her legs dangle and pt doesn't feel comfortable. 12/01/20 INITIAL   PLAN: PT frequency: 2x/week  PT duration: 10 weeks  Planned Interventions: Therapeutic exercises, Therapeutic activity, Neuro Muscular re-education, Balance training, Gait training, Patient/Family education, Joint mobilization, Stair training, Orthotic/Fit training, Cryotherapy and Moist heat  Plan for next session: Incorporate dual tasking with exercises, Continue to progress strengthening and balance exercises.    Kerrie Pleasure, PT 11/08/2020, [facility] [address] [place], [place], [address] Phone: [phone]   Fax:  [phone]  Patient name: Emily Cannon MRN: [id number] DOB: Aug 08, 1932

## 2020-11-10 ENCOUNTER — Ambulatory Visit (HOSPITAL_COMMUNITY)
Admission: RE | Admit: 2020-11-10 | Discharge: 2020-11-10 | Disposition: A | Discharge status: Home / Self Care | Payer: Medicare Other | Source: Ambulatory Visit | Attending: Pulmonary Disease | Admitting: Pulmonary Disease

## 2020-11-10 ENCOUNTER — Other Ambulatory Visit: Payer: Self-pay

## 2020-11-10 DIAGNOSIS — M317 Microscopic polyangiitis: Secondary | ICD-10-CM | POA: Diagnosis not present

## 2020-11-10 MED ORDER — ALBUTEROL SULFATE HFA 108 (90 BASE) MCG/ACT IN AERS
2.0000 | INHALATION_SPRAY | Freq: Once | RESPIRATORY_TRACT | Status: DC | PRN
  Filled 2020-11-10: qty 6.7

## 2020-11-10 MED ORDER — METHYLPREDNISOLONE SODIUM SUCC 125 MG IJ SOLR: 125.0000 mg | Freq: Once | INTRAMUSCULAR | Status: DC | PRN

## 2020-11-10 MED ORDER — DIPHENHYDRAMINE HCL 50 MG/ML IJ SOLN: 50.0000 mg | Freq: Once | INTRAMUSCULAR | Status: DC | PRN

## 2020-11-10 MED ORDER — TIXAGEVIMAB (PART OF EVUSHELD) INJECTION
300.0000 mg | Freq: Once | INTRAMUSCULAR | Status: AC
  Administered 2020-11-10: 300 mg via INTRAMUSCULAR
  Filled 2020-11-10: qty 3

## 2020-11-10 MED ORDER — CILGAVIMAB (PART OF EVUSHELD) INJECTION
300.0000 mg | Freq: Once | INTRAMUSCULAR | Status: AC
  Administered 2020-11-10: 300 mg via INTRAMUSCULAR
  Filled 2020-11-10: qty 3

## 2020-11-10 MED ORDER — EPINEPHRINE 0.3 MG/0.3ML IJ SOAJ
0.3000 mg | Freq: Once | INTRAMUSCULAR | Status: DC | PRN
Start: 2020-11-10 — End: 2020-11-11

## 2020-11-10 MED ORDER — FAMOTIDINE IN NACL 20-0.9 MG/50ML-% IV SOLN
20.0000 mg | Freq: Once | INTRAVENOUS | Status: DC | PRN
  Filled 2020-11-10: qty 50

## 2020-11-15 ENCOUNTER — Other Ambulatory Visit: Payer: Self-pay

## 2020-11-15 ENCOUNTER — Ambulatory Visit: Payer: Medicare Other

## 2020-11-15 DIAGNOSIS — M6281 Muscle weakness (generalized): Secondary | ICD-10-CM

## 2020-11-15 DIAGNOSIS — R2689 Other abnormalities of gait and mobility: Secondary | ICD-10-CM

## 2020-11-15 NOTE — Therapy (Signed)
Outpatient Physical Therapy Treatment Patient Name: Emily Cannon MRN: [id number] DOB:1933/07/12, 87 y.o., female Today's Date: 11/15/2020   PT End of Session - 11/15/20 1334    Visit Number 9    Number of Visits 21    Date for PT Re-Evaluation 12/15/20    PT Start Time 1320    PT Stop Time 1400    PT Time Calculation (min) 40 min    Equipment Utilized During Treatment Gait belt    Activity Tolerance Patient tolerated treatment well    Behavior During Therapy WFL for tasks assessed/performed           PCP: Eulas Post, MD Referring Provier: Marcial Pacas, MD  Referring Diagnosis: R26.9 (ICD-10-CM) - Gait abnormality M35.9,G63 (ICD-10-CM) - 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 Allergic rhinitis, cause unspecified   . Allergy   . Anxiety state, unspecified   . Blood transfusion without reported diagnosis    2020  . Cataract    removed both eyes   . Chronic kidney disease   . Disorder of bone and cartilage, unspecified   . Esophageal candidiasis ([place])   . Foot drop   . Gastric ulcer   . History of CMV   . History of Helicobacter pylori infection   . Hypothyroid   . Irritable bowel syndrome   . Lumbago   . Microscopic polyangiitis ([place])   . Other and unspecified hyperlipidemia   . Perforation of tympanic membrane, unspecified   . Unspecified essential hypertension    Past Surgical History:  Procedure Laterality Date  . ABDOMINAL HYSTERECTOMY    . BIOPSY  01/02/2019   Procedure: BIOPSY;  Surgeon: Yetta Flock, MD;  Location: [facility] ENDOSCOPY;  Service: Gastroenterology;;  . cataract surgery  5/12 and 12/24/2012   both eyes  . COLONOSCOPY    . COLONOSCOPY WITH PROPOFOL N/A 01/02/2019   Procedure: COLONOSCOPY WITH PROPOFOL;  Surgeon: Yetta Flock, MD;  Location: [place];  Service:  Gastroenterology;  Laterality: N/A;  . ESOPHAGOGASTRODUODENOSCOPY (EGD) WITH PROPOFOL N/A 01/02/2019   Procedure: ESOPHAGOGASTRODUODENOSCOPY (EGD) WITH PROPOFOL;  Surgeon: Yetta Flock, MD;  Location: [place];  Service: Gastroenterology;  Laterality: N/A;  . NSVD     x1  . POLYPECTOMY  01/02/2019   Procedure: POLYPECTOMY;  Surgeon: Yetta Flock, MD;  Location: [facility] ENDOSCOPY;  Service: Gastroenterology;;  . POLYPECTOMY    . TONSILLECTOMY    . UPPER GASTROINTESTINAL ENDOSCOPY     Patient Active Problem List   Diagnosis Date Noted  . 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 Peripheral neuropathy 05/08/2020  . Weakness 03/28/2020  . Gait abnormality 03/28/2020  . Paresthesia 03/28/2020  . Left foot drop 03/23/2020  . Heme positive stool   . Benign neoplasm of colon   . Acute blood loss anemia 12/30/2018  . Symptomatic anemia 12/15/2018  . AKI (acute kidney injury) ([place]) 12/15/2018  . Hypomagnesemia 12/15/2018  . Steroid-induced hyperglycemia 12/15/2018  . Microscopic polyangiitis ([place]) 11/13/2018  . Positive P-ANCA titer 10/14/2018  . Viral URI 05/12/2018  . Syncope 06/05/2016  . Unspecified venous (peripheral) insufficiency 11/23/2012  . Dermatitis 11/10/2012  . Hypothyroidism 05/22/2011  . TYMPANIC MEMBRANE PERFORATION, LEFT EAR 08/08/2007  . Osteopenia 07/27/2007  . Hyperlipidemia 05/11/2007  . Anxiety state 05/11/2007  . Essential hypertension 05/11/2007  . ALLERGIC RHINITIS 05/11/2007  .  Irritable bowel syndrome 05/11/2007  . LOW BACK PAIN 05/11/2007  . HYSTERECTOMY, HX OF 05/11/2007    SUBJECTIVE: I am getting the new shot at end of the week. It is new trial medication. I have to get these shots every 6 months.  Pain: is patient experiencing pain? No.    OBJECTIVE:   Today's Treatment:  11/15/20: Sit to stand: verbal cues only today for leaning fwd: 10x, cues for not knocking knees Gait training with cane:  walking 10 feet fwd and bwd: 2 x 5 CGA to min A Partial wall sits: bil HHA: 2 x 20", 1 x 30"  11/08/20: Scifit: manual level 10 for 6' Standing box taps: 8" box: 10x R and L , no HHA Stepping up on 8" box: one HHA: 10x R and L Sit to stand from 14" box: needed min A to stand up but able to control 90% with sitting down: 2 x 5 Stepping fwd over 2" foam beams (4" wide) without HHA: 3 beams: 2x with leading with R LE, 2x with leading with L LE Standing on foam, bending to pick up bean bag from 8" box and then throwing them underhand: 15x no HHA Standing deadlift: 15lbs from floor: 5x  11/03/20  Scifit: manual level 10 for 6'  Fwd step up: 8" box with bil HHA in parallel bars: 10x R and L  Fwd walking: 9 feet x 4 with wide BOS (foam beam between legs)  Standing deadlifts: 5lbs KB from 2" box on floor: 10x no Assist required  Dual tasking: sit to stand with counting backwards by 3s from 70- pt unable to perform physical task as she either stands or sits while trying to perform cognitive tasks. Challenged it to counting bwd by 2s from 6, pt able to perform better physically but required cues to continue to perform sit to stand tasks: without HHA from standard chair ~20-25 reps  Walking fwd with naming Months of th eyear by skipping a month, namking 3 animals that start with D, naming 3 birds that swim on water, naming 3 fruits that are green     Patient Education:  Education details: Educated pt and spouse on how to properly don/doff ankle brace. Do proximal strap first then medial (tight) and lateral (without tension). New velcro strap placed on shin pad of R AFO as it was coming off. Person educated: Patient and Spouse Education method: Explanation Education comprehension: verbalized understanding   Home Exercise Program: Access Code: [email or web address] URL: [email or web address] Date: 10/13/2020 Prepared by: Markus Jarvis  Exercises Straight Leg Raise - 1 x daily - 7 x weekly - 2  sets - 10 reps Supine Bridge - 1 x daily - 7 x weekly - 2 sets - 10 reps Sidelying Hip Abduction - 1 x daily - 7 x weekly - 2 sets - 10 reps Seated Knee Extension with Resistance - 1 x daily - 7 x weekly - 2 sets - 10 reps Seated Hamstring Curl with Anchored Resistance - 1 x daily - 7 x weekly - 2 sets - 10 reps   Assessment: Clinical impression:  Progressed walking to a cane. Pt requires min A when walking with cane. Pt is gradually improving.  Rehab potential: Good  Clinical decision making: Stable/uncomplicated  Evaluation complexity: Moderate   Goals: Goals reviewed with patient? Yes  SHORT TERM GOALS:  STG Name Target Date Goal status  1 Pt will be be able to ambulate 1200' with RW to improve walking endurance Comments: Eval =  32' with RW 11/03/20 INITIAL  2 Pt will demo compliance with HEP to improve self management of symptoms. Comments: Eval = not issued 11/03/20 INITIAL  3 Pt will get a shower chair and be able to sit and pivot into tub with min A from her husband.  11/03/20 INITIAL   LONG TERM GOALS:   LTG Name Target Date Goal status  1 Pt will be able to stand up from chair without HHA and without bracing knees against chair/bed to improve strength in bil LE Comments: Eval = needs to brace knees against chair to stand up 12/01/20 INITIAL  2 Pt will be able to ambulate >2000' with RW and without rest break to improve walking endurance Comments: Eval = 693' with RW 12/01/20 INITIAL  3 Pt will be able to ambulate at least 800' for RW and without rest break to improve functional ambulation Comments: Eval = 693' with RW without rest break 12/01/20 INITIAL  4 Pt will be able to don/doff her shoes/braces off at EOB to improve indepence. Comments: Eval =  Husband helps her with getting her shoes and braces and puts them on for her. Pt able to don/doff shoes but due to short legs her legs dangle and pt doesn't feel comfortable. 12/01/20 INITIAL   PLAN: PT frequency:  2x/week  PT duration: 10 weeks  Planned Interventions: Therapeutic exercises, Therapeutic activity, Neuro Muscular re-education, Balance training, Gait training, Patient/Family education, Joint mobilization, Stair training, Orthotic/Fit training, Cryotherapy and Moist heat  Plan for next session: Incorporate dual tasking with exercises, Continue to progress strengthening and balance exercises.    Kerrie Pleasure, PT 11/15/2020, [place] [address] [place], [place], [address] Phone: [phone]   Fax:  [phone]  Patient name: Emily Cannon MRN: [id number] DOB: [birth year]

## 2020-11-17 ENCOUNTER — Ambulatory Visit: Payer: Medicare Other

## 2020-11-21 ENCOUNTER — Ambulatory Visit: Payer: Medicare Other | Admitting: Family Medicine

## 2020-11-22 ENCOUNTER — Ambulatory Visit: Payer: Medicare Other

## 2020-11-22 ENCOUNTER — Other Ambulatory Visit: Payer: Self-pay

## 2020-11-22 DIAGNOSIS — M6281 Muscle weakness (generalized): Secondary | ICD-10-CM

## 2020-11-22 DIAGNOSIS — R2689 Other abnormalities of gait and mobility: Secondary | ICD-10-CM

## 2020-11-22 NOTE — Therapy (Addendum)
Outpatient Physical Therapy 10 visit Progress Note  Patient Name: Emily Cannon MRN: [id number] DOB:[birth year], 87 y.o., female Today's Date: 11/22/2020   PT End of Session - 11/22/20 1325    Visit Number 10    Number of Visits 21    Date for PT Re-Evaluation 12/15/20    PT Start Time 1320    PT Stop Time 1400    PT Time Calculation (min) 40 min    Equipment Utilized During Treatment Gait belt    Activity Tolerance Patient tolerated treatment well    Behavior During Therapy WFL for tasks assessed/performed           PCP: Eulas Post, MD Referring Provier: Marcial Pacas, MD  Referring Diagnosis: R26.9 (ICD-10-CM) - Gait abnormality M35.9,G63 (ICD-10-CM) - 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 Allergic rhinitis, cause unspecified   . Allergy   . Anxiety state, unspecified   . Blood transfusion without reported diagnosis    2020  . Cataract    removed both eyes   . Chronic kidney disease   . Disorder of bone and cartilage, unspecified   . Esophageal candidiasis ([place])   . Foot drop   . Gastric ulcer   . History of CMV   . History of Helicobacter pylori infection   . Hypothyroid   . Irritable bowel syndrome   . Lumbago   . Microscopic polyangiitis ([place])   . Other and unspecified hyperlipidemia   . Perforation of tympanic membrane, unspecified   . Unspecified essential hypertension    Past Surgical History:  Procedure Laterality Date  . ABDOMINAL HYSTERECTOMY    . BIOPSY  01/02/2019   Procedure: BIOPSY;  Surgeon: Yetta Flock, MD;  Location: [facility] ENDOSCOPY;  Service: Gastroenterology;;  . cataract surgery  5/12 and 12/24/2012   both eyes  . COLONOSCOPY    . COLONOSCOPY WITH PROPOFOL N/A 01/02/2019   Procedure: COLONOSCOPY WITH PROPOFOL;  Surgeon: Yetta Flock, MD;  Location: [place];   Service: Gastroenterology;  Laterality: N/A;  . ESOPHAGOGASTRODUODENOSCOPY (EGD) WITH PROPOFOL N/A 01/02/2019   Procedure: ESOPHAGOGASTRODUODENOSCOPY (EGD) WITH PROPOFOL;  Surgeon: Yetta Flock, MD;  Location: [place];  Service: Gastroenterology;  Laterality: N/A;  . NSVD     x1  . POLYPECTOMY  01/02/2019   Procedure: POLYPECTOMY;  Surgeon: Yetta Flock, MD;  Location: [facility] ENDOSCOPY;  Service: Gastroenterology;;  . POLYPECTOMY    . TONSILLECTOMY    . UPPER GASTROINTESTINAL ENDOSCOPY     Patient Active Problem List   Diagnosis Date Noted  . 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 Peripheral neuropathy 05/08/2020  . Weakness 03/28/2020  . Gait abnormality 03/28/2020  . Paresthesia 03/28/2020  . Left foot drop 03/23/2020  . Heme positive stool   . Benign neoplasm of colon   . Acute blood loss anemia 12/30/2018  . Symptomatic anemia 12/15/2018  . AKI (acute kidney injury) ([place]) 12/15/2018  . Hypomagnesemia 12/15/2018  . Steroid-induced hyperglycemia 12/15/2018  . Microscopic polyangiitis ([place]) 11/13/2018  . Positive P-ANCA titer 10/14/2018  . Viral URI 05/12/2018  . Syncope 06/05/2016  . Unspecified venous (peripheral) insufficiency 11/23/2012  . Dermatitis 11/10/2012  . Hypothyroidism 05/22/2011  . TYMPANIC MEMBRANE PERFORATION, LEFT EAR 08/08/2007  . Osteopenia 07/27/2007  . Hyperlipidemia 05/11/2007  . Anxiety state 05/11/2007  . Essential hypertension 05/11/2007  . ALLERGIC  RHINITIS 05/11/2007  . Irritable bowel syndrome 05/11/2007  . LOW BACK PAIN 05/11/2007  . HYSTERECTOMY, HX OF 05/11/2007    SUBJECTIVE: I am getting the new shot at end of the week. It is new trial medication. I have to get these shots every 6 months.  Pain: is patient experiencing pain? No.    OBJECTIVE:   Today's Treatment:  11/22/20: Sit to stand: 5x Gait training: 1 x 210' with st. Kasandra Knudsen, 1 x 210' with horizontal and vertical head turns with  st. Cane  Gait training: RW: 1 x 1527' with RW, cues to keep walker closer, stand up straight, cues for keeping elbows clower to her side and improving heel to toe gait Discussed walking program: printed calendar for patient. Starting with 10 min of walking per day and then increasing by 2 min every week. Walking with RW and husband 11/15/20: Sit to stand: verbal cues only today for leaning fwd: 10x, cues for not knocking knees Gait training with cane: walking 10 feet fwd and bwd: 2 x 5 CGA to min A Partial wall sits: bil HHA: 2 x 20", 1 x 30"  11/08/20: Scifit: manual level 10 for 6' Standing box taps: 8" box: 10x R and L , no HHA Stepping up on 8" box: one HHA: 10x R and L Sit to stand from 14" box: needed min A to stand up but able to control 90% with sitting down: 2 x 5 Stepping fwd over 2" foam beams (4" wide) without HHA: 3 beams: 2x with leading with R LE, 2x with leading with L LE Standing on foam, bending to pick up bean bag from 8" box and then throwing them underhand: 15x no HHA Standing deadlift: 15lbs from floor: 5x  11/03/20  Scifit: manual level 10 for 6'  Fwd step up: 8" box with bil HHA in parallel bars: 10x R and L  Fwd walking: 9 feet x 4 with wide BOS (foam beam between legs)  Standing deadlifts: 5lbs KB from 2" box on floor: 10x no Assist required  Dual tasking: sit to stand with counting backwards by 3s from 70- pt unable to perform physical task as she either stands or sits while trying to perform cognitive tasks. Challenged it to counting bwd by 2s from 84, pt able to perform better physically but required cues to continue to perform sit to stand tasks: without HHA from standard chair ~20-25 reps  Walking fwd with naming Months of th eyear by skipping a month, namking 3 animals that start with D, naming 3 birds that swim on water, naming 3 fruits that are green     Patient Education:  Education details: Educated pt and spouse on how to properly don/doff ankle brace. Do  proximal strap first then medial (tight) and lateral (without tension). New velcro strap placed on shin pad of R AFO as it was coming off. Person educated: Patient and Spouse Education method: Explanation Education comprehension: verbalized understanding   Home Exercise Program: Access Code: [email or web address] URL: [email or web address] Date: 10/13/2020 Prepared by: Markus Jarvis  Exercises Straight Leg Raise - 1 x daily - 7 x weekly - 2 sets - 10 reps Supine Bridge - 1 x daily - 7 x weekly - 2 sets - 10 reps Sidelying Hip Abduction - 1 x daily - 7 x weekly - 2 sets - 10 reps Seated Knee Extension with Resistance - 1 x daily - 7 x weekly - 2 sets - 10 reps Seated Hamstring Curl with Anchored  Resistance - 1 x daily - 7 x weekly - 2 sets - 10 reps   Assessment: Clinical impression:  Patient has been seen for total of 10 sessions for gait and balance disorder. Patient has met all of her short term goals and making good progress towards her long term goals. Patient will continue to benefit from skilled PT  Rehab potential: Good  Clinical decision making: Stable/uncomplicated  Evaluation complexity: Moderate   Goals: Goals reviewed with patient? Yes  SHORT TERM GOALS:  STG Name Target Date Goal status  1 Pt will be be able to ambulate 1200' with RW to improve walking endurance Comments: Eval = 693' with RW; 1527' with RW (11/22/20) 11/03/20 Goal met   2 Pt will demo compliance with HEP to improve self management of symptoms. Comments: Eval = not issued; compliant 11/03/20 Goal met  3 Pt will get a shower chair and be able to sit and pivot into tub with min A from her husband. Got shower chair on 11/21/20 11/03/20 Goal met   LONG TERM GOALS:   LTG Name Target Date Goal status  1 Pt will be able to stand up from chair without HHA and without bracing knees against chair/bed to improve strength in bil LE Comments: Eval = needs to brace knees against chair to stand up;  able to stand up but rquires CGA 12/01/20 Progressing 11/22/20  2 Pt will be able to ambulate >2000' with RW and without rest break to improve walking endurance Comments: Eval = 693' with RW;11/22/20 1527; 12/01/20 Progressing  3 Pt will be able to ambulate at least 800' with LRAD and without rest break to improve functional ambulation Comments: Eval = 693' with RW without rest break 12/01/20 INITIAL  4 Pt will be able to don/doff her shoes/braces off at EOB to improve indepence. Comments: Eval =  Husband helps her with getting her shoes and braces and puts them on for her. Pt able to don/doff shoes but due to short legs her legs dangle and pt doesn't feel comfortable. 12/01/20 INITIAL   PLAN: PT frequency: 2x/week  PT duration: 10 weeks  Planned Interventions: Therapeutic exercises, Therapeutic activity, Neuro Muscular re-education, Balance training, Gait training, Patient/Family education, Joint mobilization, Stair training, Orthotic/Fit training, Cryotherapy and Moist heat  Plan for next session: Incorporate dual tasking with exercises, Continue to progress strengthening and balance exercises.    Kerrie Pleasure, PT 11/22/2020, [place] [address] [place], [place], [address] Phone: [phone]   Fax:  [phone]  Patient name: Emily Cannon MRN: [id number] DOB: 1932/08/23

## 2020-11-24 ENCOUNTER — Ambulatory Visit: Payer: Medicare Other

## 2020-11-24 ENCOUNTER — Other Ambulatory Visit: Payer: Self-pay

## 2020-11-24 DIAGNOSIS — M6281 Muscle weakness (generalized): Secondary | ICD-10-CM | POA: Diagnosis not present

## 2020-11-24 DIAGNOSIS — R2689 Other abnormalities of gait and mobility: Secondary | ICD-10-CM

## 2020-11-24 NOTE — Therapy (Signed)
Outpatient Physical Therapy 10 visit Progress Note  Patient Name: ALETA MANTERNACH MRN: [id number] DOB:[birth year], 87 y.o., female Today's Date: 11/24/2020   PT End of Session - 11/24/20 1122    Visit Number 11    Number of Visits 21    Date for PT Re-Evaluation 12/15/20    Authorization Type 10 v PN on 11/22/20    PT Start Time 1100    PT Stop Time 1145    PT Time Calculation (min) 45 min    Equipment Utilized During Treatment Gait belt    Activity Tolerance Patient tolerated treatment well    Behavior During Therapy WFL for tasks assessed/performed           PCP: Eulas Post, MD Referring Provier: Marcial Pacas, MD  Referring Diagnosis: R26.9 (ICD-10-CM) - Gait abnormality M35.9,G63 (ICD-10-CM) - 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 Allergic rhinitis, cause unspecified   . Allergy   . Anxiety state, unspecified   . Blood transfusion without reported diagnosis    2020  . Cataract    removed both eyes   . Chronic kidney disease   . Disorder of bone and cartilage, unspecified   . Esophageal candidiasis ([place])   . Foot drop   . Gastric ulcer   . History of CMV   . History of Helicobacter pylori infection   . Hypothyroid   . Irritable bowel syndrome   . Lumbago   . Microscopic polyangiitis ([place])   . Other and unspecified hyperlipidemia   . Perforation of tympanic membrane, unspecified   . Unspecified essential hypertension    Past Surgical History:  Procedure Laterality Date  . ABDOMINAL HYSTERECTOMY    . BIOPSY  01/02/2019   Procedure: BIOPSY;  Surgeon: Yetta Flock, MD;  Location: [facility] ENDOSCOPY;  Service: Gastroenterology;;  . cataract surgery  5/12 and 12/24/2012   both eyes  . COLONOSCOPY    . COLONOSCOPY WITH PROPOFOL N/A 01/02/2019   Procedure: COLONOSCOPY WITH PROPOFOL;  Surgeon: Yetta Flock, MD;  Location: [place];  Service: Gastroenterology;  Laterality: N/A;  . ESOPHAGOGASTRODUODENOSCOPY (EGD) WITH PROPOFOL N/A 01/02/2019   Procedure: ESOPHAGOGASTRODUODENOSCOPY (EGD) WITH PROPOFOL;  Surgeon: Yetta Flock, MD;  Location: [place];  Service: Gastroenterology;  Laterality: N/A;  . NSVD     x1  . POLYPECTOMY  01/02/2019   Procedure: POLYPECTOMY;  Surgeon: Yetta Flock, MD;  [facility] ENDOSCOPY;  Service: Gastroenterology;;  . POLYPECTOMY    . TONSILLECTOMY    . UPPER GASTROINTESTINAL ENDOSCOPY     Patient Active Problem List   Diagnosis Date Noted  . 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 Peripheral neuropathy 05/08/2020  . Weakness 03/28/2020  . Gait abnormality 03/28/2020  . Paresthesia 03/28/2020  . Left foot drop 03/23/2020  . Heme positive stool   . Benign neoplasm of colon   . Acute blood loss anemia 12/30/2018  . Symptomatic anemia 12/15/2018  . AKI (acute kidney injury) ([place]) 12/15/2018  . Hypomagnesemia 12/15/2018  . Steroid-induced hyperglycemia 12/15/2018  . Microscopic polyangiitis ([place]) 11/13/2018  . Positive P-ANCA titer 10/14/2018  . Viral URI 05/12/2018  . Syncope 06/05/2016  . Unspecified venous (peripheral) insufficiency 11/23/2012  . Dermatitis 11/10/2012  . Hypothyroidism 05/22/2011  . TYMPANIC MEMBRANE PERFORATION, LEFT EAR 08/08/2007  . Osteopenia 07/27/2007  . Hyperlipidemia 05/11/2007  . Anxiety  state 05/11/2007  . Essential hypertension 05/11/2007  . ALLERGIC RHINITIS 05/11/2007  . Irritable bowel syndrome 05/11/2007  . LOW BACK PAIN 05/11/2007  . HYSTERECTOMY, HX OF 05/11/2007    SUBJECTIVE: No new symptoms to report  Pain: is patient experiencing pain? No.    OBJECTIVE:   Today's Treatment:   11/24/20: Gait training: st. Cane: CGA 1 x 874' Stair training: cane and one rail: - going up with R and down with L: 4 x 4 steps with 2x with rail on R and 2x with  rail on L - Going up with L and down with R (to work on weaker leg strength): cane and one rail on L going up: 16 steps Gait training: stepping over 6" hurdles: 2 hurdles: leading with R leg 6x, leading with L leg 6x with cane and CGA to min A occaisonal Gait training: walking through 10 feet of gravel and 10 feet of mulch with stepping u and down 2" curb step.  11/22/20: Sit to stand: 5x Gait training: 1 x 210' with st. Kasandra Knudsen, 1 x 210' with horizontal and vertical head turns with st. Cane  Gait training: RW: 1 x 1527' with RW, cues to keep walker closer, stand up straight, cues for keeping elbows clower to her side and improving heel to toe gait Discussed walking program: printed calendar for patient. Starting with 10 min of walking per day and then increasing by 2 min every week. Walking with RW and husband      Patient Education:  Education details: Educated pt and spouse on how to properly don/doff ankle brace. Do proximal strap first then medial (tight) and lateral (without tension). New velcro strap placed on shin pad of R AFO as it was coming off. Person educated: Patient and Spouse Education method: Explanation Education comprehension: verbalized understanding   Home Exercise Program: Access Code: [email or web address] URL: [email or web address] Date: 10/13/2020 Prepared by: Markus Jarvis  Exercises Straight Leg Raise - 1 x daily - 7 x weekly - 2 sets - 10 reps Supine Bridge - 1 x daily - 7 x weekly - 2 sets - 10 reps Sidelying Hip Abduction - 1 x daily - 7 x weekly - 2 sets - 10 reps Seated Knee Extension with Resistance - 1 x daily - 7 x weekly - 2 sets - 10 reps Seated Hamstring Curl with Anchored Resistance - 1 x daily - 7 x weekly - 2 sets - 10 reps   Assessment: Clinical impression:  Pt is demonstrating significant improvement in her ability to walk with cane but requires CGA for safety. She demo anxiety with more complex functional gait exercises due to lack of  confidence. Pt requires cues for proper steps negotiations and will benefit from continued skilled PT.   Rehab potential: Good  Clinical decision making: Stable/uncomplicated  Evaluation complexity: Moderate   Goals: Goals reviewed with patient? Yes  SHORT TERM GOALS:  STG Name Target Date Goal status  1 Pt will be be able to ambulate 1200' with RW to improve walking endurance Comments: Eval = 693' with RW; 1527' with RW (11/22/20) 11/03/20 Goal met   2 Pt will demo compliance with HEP to improve self management of symptoms. Comments: Eval = not issued; compliant 11/03/20 Goal met  3 Pt will get a shower chair and be able to sit and pivot into tub with min A from her husband. Got shower chair on 11/21/20 11/03/20 Goal met   LONG TERM GOALS:   LTG Name Target Date  Goal status  1 Pt will be able to stand up from chair without HHA and without bracing knees against chair/bed to improve strength in bil LE Comments: Eval = needs to brace knees against chair to stand up; able to stand up but rquires CGA 12/01/20 Progressing 11/22/20  2 Pt will be able to ambulate >2000' with RW and without rest break to improve walking endurance Comments: Eval = 693' with RW;11/22/20 1527; 12/01/20 Progressing  3 Pt will be able to ambulate at least 800' with LRAD and without rest break to improve functional ambulation Comments: Eval = 693' with RW without rest break, 874' with st. Cane with CGA 12/01/20 Progressing   4 Pt will be able to don/doff her shoes/braces off at EOB to improve indepence. Comments: Eval =  Husband helps her with getting her shoes and braces and puts them on for her. Pt able to don/doff shoes but due to short legs her legs dangle and pt doesn't feel comfortable. 12/01/20 INITIAL   PLAN: PT frequency: 2x/week  PT duration: 10 weeks  Planned Interventions: Therapeutic exercises, Therapeutic activity, Neuro Muscular re-education, Balance training, Gait training, Patient/Family education,  Joint mobilization, Stair training, Orthotic/Fit training, Cryotherapy and Moist heat  Plan for next session: Incorporate dual tasking with exercises, Continue to progress strengthening and balance exercises.    Kerrie Pleasure, PT 11/24/2020, [place] [address] [place], [place], [address] Phone: [phone]   Fax:  [phone]  Patient name: LASUNDRA HASCALL MRN: [id number] DOB: August 22, 1932

## 2020-11-27 ENCOUNTER — Encounter: Payer: Self-pay | Admitting: Family Medicine

## 2020-11-27 ENCOUNTER — Other Ambulatory Visit: Payer: Self-pay

## 2020-11-27 ENCOUNTER — Ambulatory Visit: Payer: Medicare Other | Admitting: Family Medicine

## 2020-11-27 VITALS — BP 130/62 | HR 71 | Temp 97.8°F

## 2020-11-27 DIAGNOSIS — I10 Essential (primary) hypertension: Secondary | ICD-10-CM

## 2020-11-27 DIAGNOSIS — M317 Microscopic polyangiitis: Secondary | ICD-10-CM | POA: Diagnosis not present

## 2020-11-27 DIAGNOSIS — R238 Other skin changes: Secondary | ICD-10-CM | POA: Diagnosis not present

## 2020-11-27 NOTE — Progress Notes (Signed)
Established Patient Office Visit  Subjective:  Patient ID: Emily Cannon, female    DOB: Mar 09, 1933  Age: 87 y.o. MRN: [id number]  CC:  Chief Complaint  Patient presents with  . Follow-up    hypertension    HPI [name] presents for follow-up regarding hypertension.  She has history of microscopic polyangiitis.  She has longstanding history of hypertension and was on combination therapy with multiple medications including hydralazine at the time of her diagnosis.  Recommended recently that hydralazine be discontinued if possible as this can be associated with ANCA positive vasculitis.  We gradually tapered her off and now she is completely off the hydralazine past couple of weeks.  By home blood pressures they are consistently getting around 423N systolic and 36R diastolic.  No headaches.  No dizziness.  She does still remain on amlodipine 10 mg daily and metoprolol extended release 50 mg twice daily.  She is followed by multiple specialists including nephrology, rheumatology, and dermatology.  She does relate having a couple of small vesicles suprapubic area just left of the midline.  She noticed these several days ago.  There has been concern of her having recurrent shingles in the past.  She apparently still has prescription at home for Valtrex from dermatology.  She has not noticed any recent pustules.  No fever.  Past Medical History:  Diagnosis Date  . Allergic rhinitis, cause unspecified   . Allergy   . Anxiety state, unspecified   . Blood transfusion without reported diagnosis    2020  . Cataract    removed both eyes   . Chronic kidney disease   . Disorder of bone and cartilage, unspecified   . Esophageal candidiasis ([place])   . Foot drop   . Gastric ulcer   . History of CMV   . History of Helicobacter pylori infection   . Hypothyroid   . Irritable bowel syndrome   . Lumbago   . Microscopic polyangiitis ([place])   . Other and unspecified hyperlipidemia   .  Perforation of tympanic membrane, unspecified   . Unspecified essential hypertension     Past Surgical History:  Procedure Laterality Date  . ABDOMINAL HYSTERECTOMY    . BIOPSY  01/02/2019   Procedure: BIOPSY;  Surgeon: Yetta Flock, MD;  Location: [facility] ENDOSCOPY;  Service: Gastroenterology;;  . cataract surgery  5/12 and 12/24/2012   both eyes  . COLONOSCOPY    . COLONOSCOPY WITH PROPOFOL N/A 01/02/2019   Procedure: COLONOSCOPY WITH PROPOFOL;  Surgeon: Yetta Flock, MD;  Location: [place];  Service: Gastroenterology;  Laterality: N/A;  . ESOPHAGOGASTRODUODENOSCOPY (EGD) WITH PROPOFOL N/A 01/02/2019   Procedure: ESOPHAGOGASTRODUODENOSCOPY (EGD) WITH PROPOFOL;  Surgeon: Yetta Flock, MD;  Location: [place];  Service: Gastroenterology;  Laterality: N/A;  . NSVD     x1  . POLYPECTOMY  01/02/2019   Procedure: POLYPECTOMY;  Surgeon: Yetta Flock, MD;  Location: [facility] ENDOSCOPY;  Service: Gastroenterology;;  . POLYPECTOMY    . TONSILLECTOMY    . UPPER GASTROINTESTINAL ENDOSCOPY      Family History  Problem Relation Age of Onset  . Lung cancer Mother 80  . Lung disease Father 65  . COPD Other        sibling  . Colon cancer Neg Hx   . Colon polyps Neg Hx   . Esophageal cancer Neg Hx   . Rectal cancer Neg Hx   . Stomach cancer Neg Hx     Social History  Socioeconomic History  . Marital status: Married    Spouse name: Jeneen Rinks  . Number of children: 2  . Years of education: 53  . Highest education level: High school graduate  Occupational History  . Occupation: retired Research scientist (physical sciences)  Tobacco Use  . Smoking status: Never Smoker  . Smokeless tobacco: Never Used  Vaping Use  . Vaping Use: Never used  Substance and Sexual Activity  . Alcohol use: No  . Drug use: No  . Sexual activity: Not Currently  Other Topics Concern  . Not on file  Social History Narrative   Lives at home with her husband.   Right-handed.   1-2 cups coffee daily, occasional  tea or soda.   Social Determinants of Health   Financial Resource Strain: Not on file  Food Insecurity: Not on file  Transportation Needs: Not on file  Physical Activity: Not on file  Stress: Not on file  Social Connections: Not on file  Intimate Partner Violence: Not on file    Outpatient Medications Prior to Visit  Medication Sig Dispense Refill  . amLODipine (NORVASC) 10 MG tablet TAKE ONE TABLET BY MOUTH DAILY 90 tablet 3  . atorvastatin (LIPITOR) 20 MG tablet TAKE ONE TABLET BY MOUTH DAILY 90 tablet 3  . Avacopan (TAVNEOS) 10 MG CAPS Take 3 capsules by mouth twice daily with meals.    . calcitRIOL (ROCALTROL) 0.25 MCG capsule Take 0.25 mcg by mouth every Monday, Wednesday, and Friday.     . clotrimazole-betamethasone (LOTRISONE) cream Apply 1 application topically 2 (two) times daily. 30 g 0  . esomeprazole (NEXIUM) 40 MG capsule TAKE ONE CAPSULE BY MOUTH DAILY 30 capsule 3  . furosemide (LASIX) 40 MG tablet Take 0.5 tablets (20 mg total) by mouth 3 (three) times a week. Monday, Wed, Friday (Patient taking differently: Take 40 mg by mouth every Monday, Wednesday, and Friday.) 30 tablet   . gabapentin (NEURONTIN) 100 MG capsule Take 2 capsules by mouth at night for neuropathy pain 180 capsule 3  . levothyroxine (SYNTHROID) 50 MCG tablet TAKE ONE TABLET BY MOUTH EVERY MORNING BEFORE BREAKFAST 90 tablet 1  . metoprolol succinate (TOPROL-XL) 50 MG 24 hr tablet TAKE ONE TABLET BY MOUTH TWICE A DAY 180 tablet 0  . Multiple Vitamins-Minerals (CENTRUM SILVER 50+WOMEN) TABS Take 1 tablet by mouth daily with breakfast.    . polyethylene glycol (MIRALAX) 17 g packet Take as directed twice daily and titrate as needed 14 each 0  . predniSONE (DELTASONE) 10 MG tablet Take 10 mg by mouth daily with breakfast. Taking 2.5 tablets daily through 10/01/20 then 2 tablets daily through 10/08/20. They will then be further instructed by Dr. Baxter Flattery at [facility].    . traMADol (ULTRAM) 50 MG tablet TAKE ONE  TABLET BY MOUTH EVERY 6 HOURS AS NEEDED FOR UP TO 5 DAYS 30 tablet 0  . valACYclovir (VALTREX) 1000 MG tablet Take 1,000 mg by mouth 3 (three) times daily. Started 03/27/20 - taking for 7 days.     No facility-administered medications prior to visit.    Allergies  Allergen Reactions  . Alendronate Sodium Shortness Of Breath  . Amoxicillin Diarrhea    Severe diarrhea  . Codeine Nausea Only  . Valsartan Swelling    Gums/throat swell    ROS Review of Systems  Constitutional: Negative for chills, fatigue and fever.  Eyes: Negative for visual disturbance.  Respiratory: Negative for cough, chest tightness, shortness of breath and wheezing.   Cardiovascular: Negative for chest pain, palpitations  and leg swelling.  Skin: Positive for rash.  Neurological: Negative for dizziness, seizures, syncope, weakness, light-headedness and headaches.      Objective:    Physical Exam Vitals reviewed.  Constitutional:      Appearance: Normal appearance.  Cardiovascular:     Rate and Rhythm: Normal rate and regular rhythm.  Pulmonary:     Effort: Pulmonary effort is normal.     Breath sounds: Normal breath sounds.  Skin:    Findings: Rash present.     Comments: Suprapubic area just left of midline she has a couple small vesicles.  Very mild surrounding erythema.  No pustules.  Did not see any acute vesicles left back or buttock region.  Neurological:     Mental Status: She is alert.     BP 130/62 (BP Location: Left Arm, Patient Position: Sitting, Cuff Size: Normal)   Pulse 71   Temp 97.8 F (36.6 C) (Oral)   SpO2 94%  Wt Readings from Last 3 Encounters:  09/25/20 110 lb (49.9 kg)  08/28/20 110 lb (49.9 kg)  08/22/20 110 lb 9.6 oz (50.2 kg)     Health Maintenance Due  Topic Date Due  . TETANUS/TDAP  07/28/2019  . COVID-19 Vaccine (3 - Booster) 03/19/2020    There are no preventive care reminders to display for this patient.  Lab Results  Component Value Date   TSH 3.390  07/18/2020   Lab Results  Component Value Date   WBC 14.5 (H) 03/24/2020   HGB 12.3 03/24/2020   HCT 36.5 03/24/2020   MCV 90.1 03/24/2020   PLT 279 03/24/2020   Lab Results  Component Value Date   NA 138 03/24/2020   K 3.9 03/24/2020   CO2 30 03/24/2020   GLUCOSE 102 (H) 03/24/2020   BUN 22 03/24/2020   CREATININE 1.43 (H) 03/24/2020   BILITOT 0.5 03/24/2020   ALKPHOS 101 01/17/2020   AST 16 03/24/2020   ALT 16 03/24/2020   PROT 6.8 07/18/2020   ALBUMIN 4.3 01/17/2020   CALCIUM 9.5 03/24/2020   ANIONGAP 7 01/03/2019   GFR 32.78 (L) 01/17/2020   Lab Results  Component Value Date   CHOL 148 01/17/2020   Lab Results  Component Value Date   HDL 31.30 (L) 01/17/2020   Lab Results  Component Value Date   LDLCALC 65 09/11/2018   Lab Results  Component Value Date   TRIG (H) 01/17/2020    533.0 Triglyceride is over 400; calculations on Lipids are invalid.   Lab Results  Component Value Date   CHOLHDL 5 01/17/2020   Lab Results  Component Value Date   HGBA1C 5.7 (H) 07/18/2020      Assessment & Plan:   #1 hypertension stable following recent taper off of hydralazine.  She has history of ANCA positive vasculitis and we tapered off the hydralazine for that reason. -Continue amlodipine and metoprolol. -Continue to monitor blood pressure closely at home and be in touch if consistent readings greater than 140/90  #2 vesicular rash suprapubic area.  This does look like possible recurrent shingles.  Minimal pain thus far. -They have home prescription of Valtrex and are encouraged to start back 1 g 3 times daily for 7 days.  Be in touch for any persistent or worsening rash  #3 microscopic polyangiitis.  Currently stable.  Followed by rheumatology.  Still on prednisone but only 2.5 mg and plan is to for this to be tapered off completely  No orders of the defined types were  placed in this encounter.   Follow-up: Return in about 3 months (around 02/27/2021).    Carolann Littler, MD

## 2020-11-27 NOTE — Patient Instructions (Signed)
BP is stable today  Continue to monitor blood pressure and be in touch if consistently > 140/90.    Get back on the Valtrex for one week.

## 2020-11-29 ENCOUNTER — Other Ambulatory Visit: Payer: Self-pay

## 2020-11-29 ENCOUNTER — Ambulatory Visit: Payer: Medicare Other | Attending: Neurology

## 2020-11-29 ENCOUNTER — Ambulatory Visit: Payer: Medicare Other

## 2020-11-29 DIAGNOSIS — R2689 Other abnormalities of gait and mobility: Secondary | ICD-10-CM | POA: Diagnosis present

## 2020-11-29 DIAGNOSIS — M6281 Muscle weakness (generalized): Secondary | ICD-10-CM | POA: Diagnosis not present

## 2020-11-29 NOTE — Therapy (Signed)
Outpatient Physical Therapy 10 visit Progress Note  Patient Name: Emily Cannon MRN: [id number] DOB:1933/01/31, 87 y.o., female Today's Date: 11/29/2020   PT End of Session - 11/29/20 1037    Visit Number 12    Number of Visits 21    Date for PT Re-Evaluation 12/15/20    Authorization Type 10 v PN on 11/22/20    PT Start Time 1020    PT Stop Time 1100    PT Time Calculation (min) 40 min    Equipment Utilized During Treatment Gait belt    Activity Tolerance Patient tolerated treatment well    Behavior During Therapy WFL for tasks assessed/performed           PCP: Eulas Post, MD Referring [place]: Marcial Pacas, MD  Referring Diagnosis: R26.9 (ICD-10-CM) - Gait abnormality M35.9,G63 (ICD-10-CM) - 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 Allergic rhinitis, cause unspecified   . Allergy   . Anxiety state, unspecified   . Blood transfusion without reported diagnosis    2020  . Cataract    removed both eyes   . Chronic kidney disease   . Disorder of bone and cartilage, unspecified   . Esophageal candidiasis ([place])   . Foot drop   . Gastric ulcer   . History of CMV   . History of Helicobacter pylori infection   . Hypothyroid   . Irritable bowel syndrome   . Lumbago   . Microscopic polyangiitis ([place])   . Other and unspecified hyperlipidemia   . Perforation of tympanic membrane, unspecified   . Unspecified essential hypertension    Past Surgical History:  Procedure Laterality Date  . ABDOMINAL HYSTERECTOMY    . BIOPSY  01/02/2019   Procedure: BIOPSY;  Surgeon: Yetta Flock, MD;  Location: [facility] ENDOSCOPY;  Service: Gastroenterology;;  . cataract surgery  5/12 and 12/24/2012   both eyes  . COLONOSCOPY    . COLONOSCOPY WITH PROPOFOL N/A 01/02/2019   Procedure: COLONOSCOPY WITH PROPOFOL;  Surgeon: Yetta Flock, MD;  Location: [place];  Service: Gastroenterology;  Laterality: N/A;  . ESOPHAGOGASTRODUODENOSCOPY (EGD) WITH PROPOFOL N/A 01/02/2019   Procedure: ESOPHAGOGASTRODUODENOSCOPY (EGD) WITH PROPOFOL;  Surgeon: Yetta Flock, MD;  Location: [place];  Service: Gastroenterology;  Laterality: N/A;  . NSVD     x1  . POLYPECTOMY  01/02/2019   Procedure: POLYPECTOMY;  Surgeon: Yetta Flock, MD;  Location: [facility] ENDOSCOPY;  Service: Gastroenterology;;  . POLYPECTOMY    . TONSILLECTOMY    . UPPER GASTROINTESTINAL ENDOSCOPY     Patient Active Problem List   Diagnosis Date Noted  . 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 Peripheral neuropathy 05/08/2020  . Weakness 03/28/2020  . Gait abnormality 03/28/2020  . Paresthesia 03/28/2020  . Left foot drop 03/23/2020  . Heme positive stool   . Benign neoplasm of colon   . Acute blood loss anemia 12/30/2018  . Symptomatic anemia 12/15/2018  . AKI (acute kidney injury) ([place]) 12/15/2018  . Hypomagnesemia 12/15/2018  . Steroid-induced hyperglycemia 12/15/2018  . Microscopic polyangiitis ([place]) 11/13/2018  . Positive P-ANCA titer 10/14/2018  . Viral URI 05/12/2018  . Syncope 06/05/2016  . Unspecified venous (peripheral) insufficiency 11/23/2012  . Dermatitis 11/10/2012  . Hypothyroidism 05/22/2011  . TYMPANIC MEMBRANE PERFORATION, LEFT EAR 08/08/2007  . Osteopenia 07/27/2007  . Hyperlipidemia 05/11/2007  . Anxiety  state 05/11/2007  . Essential hypertension 05/11/2007  . ALLERGIC RHINITIS 05/11/2007  . Irritable bowel syndrome 05/11/2007  . LOW BACK PAIN 05/11/2007  . HYSTERECTOMY, HX OF 05/11/2007    SUBJECTIVE: No new symptoms to report  Pain: is patient experiencing pain? No.    OBJECTIVE:   Today's Treatment:  11/29/20:  Gait training: st. Cane with CGA: 1 x 420' with cognitive tasks of naming 7 fruits that are red, pt able to name 5/7 without significant gait deviations Going  up and down ramp: fwd and bwd: incline and decline: 5x each way with st. Cane and min A Up and down curb steps with cane and min A: verbal cues for sequencing: 1x Up and down stairs: 3x with cane in R hand and one rail; 3 x with cane in L hand and one rail with 4 steps. Total 24 steps Grade II-III rear foot mobilization mid foot mobilization Passive foot stretching into dorsiflexion: 3 x 20" L only 11/24/20: Gait training: st. Cane: CGA 1 x 874' Stair training: cane and one rail: - going up with R and down with L: 4 x 4 steps with 2x with rail on R and 2x with rail on L - Going up with L and down with R (to work on weaker leg strength): cane and one rail on L going up: 16 steps Gait training: stepping over 6" hurdles: 2 hurdles: leading with R leg 6x, leading with L leg 6x with cane and CGA to min A occaisonal Gait training: walking through 10 feet of gravel and 10 feet of mulch with stepping u and down 2" curb step.  11/22/20: Sit to stand: 5x Gait training: 1 x 210' with st. Kasandra Knudsen, 1 x 210' with horizontal and vertical head turns with st. Cane  Gait training: RW: 1 x 1527' with RW, cues to keep walker closer, stand up straight, cues for keeping elbows clower to her side and improving heel to toe gait Discussed walking program: printed calendar for patient. Starting with 10 min of walking per day and then increasing by 2 min every week. Walking with RW and husband      Patient Education:  Education details: Educated pt and spouse on how to properly don/doff ankle brace. Do proximal strap first then medial (tight) and lateral (without tension). New velcro strap placed on shin pad of R AFO as it was coming off. Person educated: Patient and Spouse Education method: Explanation Education comprehension: verbalized understanding   Home Exercise Program: Access Code: [email or web address] URL: [email or web address] Date: 10/13/2020 Prepared by: Markus Jarvis  Exercises Straight Leg  Raise - 1 x daily - 7 x weekly - 2 sets - 10 reps Supine Bridge - 1 x daily - 7 x weekly - 2 sets - 10 reps Sidelying Hip Abduction - 1 x daily - 7 x weekly - 2 sets - 10 reps Seated Knee Extension with Resistance - 1 x daily - 7 x weekly - 2 sets - 10 reps Seated Hamstring Curl with Anchored Resistance - 1 x daily - 7 x weekly - 2 sets - 10 reps   Assessment: Clinical impression:  Pt has trace amount of mm activation for ankle dorsiflexion. Pt needed more assistance with going up and down ramp and curb step with st. Cane. Pt able to carry on cognitive task with walking with st. Cane without significant gait deviation today  Rehab potential: Good  Clinical decision making: Stable/uncomplicated  Evaluation complexity: Moderate   Goals: Goals reviewed with  patient? Yes  SHORT TERM GOALS:  STG Name Target Date Goal status  1 Pt will be be able to ambulate 1200' with RW to improve walking endurance Comments: Eval = 693' with RW; 1527' with RW (11/22/20) 11/03/20 Goal met   2 Pt will demo compliance with HEP to improve self management of symptoms. Comments: Eval = not issued; compliant 11/03/20 Goal met  3 Pt will get a shower chair and be able to sit and pivot into tub with min A from her husband. Got shower chair on 11/21/20 11/03/20 Goal met   LONG TERM GOALS:   LTG Name Target Date Goal status  1 Pt will be able to stand up from chair without HHA and without bracing knees against chair/bed to improve strength in bil LE Comments: Eval = needs to brace knees against chair to stand up; able to stand up but rquires CGA 12/01/20 Progressing 11/22/20  2 Pt will be able to ambulate >2000' with RW and without rest break to improve walking endurance Comments: Eval = 693' with RW;11/22/20 1527; 12/01/20 Progressing  3 Pt will be able to ambulate at least 800' with LRAD and without rest break to improve functional ambulation Comments: Eval = 693' with RW without rest break, 874' with st. Cane  with CGA 12/01/20 Progressing   4 Pt will be able to don/doff her shoes/braces off at EOB to improve indepence. Comments: Eval =  Husband helps her with getting her shoes and braces and puts them on for her. Pt able to don/doff shoes but due to short legs her legs dangle and pt doesn't feel comfortable. 12/01/20 INITIAL   PLAN: PT frequency: 2x/week  PT duration: 10 weeks  Planned Interventions: Therapeutic exercises, Therapeutic activity, Neuro Muscular re-education, Balance training, Gait training, Patient/Family education, Joint mobilization, Stair training, Orthotic/Fit training, Cryotherapy and Moist heat  Plan for next session: Incorporate dual tasking with exercises, Continue to progress strengthening and balance exercises.    Kerrie Pleasure, PT 11/29/2020, [place] [facility] [address] [place], [place], [address] Phone: [phone]   Fax:  [phone]  Patient name: Emily Cannon MRN: [id number] DOB: March 19, 1933

## 2020-11-30 ENCOUNTER — Telehealth: Payer: Self-pay | Admitting: Family Medicine

## 2020-11-30 NOTE — Telephone Encounter (Signed)
pts spouse is calling in stating that the pts kidney doctor is wanting the pt to increase her Rx metoprolol succinate (TOPROL-XL) 50 MG from 2 pills a day to 3 pills a day.  Spouse is wanting to see if Dr. Elease Hashimoto agrees to the increase he stated that he will not change the dosage until he hears back from Dr. Elease Hashimoto.  Pts  BP 140/70 Hr 63  9:23 am.  Pt and spouse would like to have a call back.

## 2020-11-30 NOTE — Telephone Encounter (Signed)
Agree with change as per nephrology.

## 2020-11-30 NOTE — Telephone Encounter (Signed)
Spoke to the patients husband. He is aware that DR. Burchette agrees her medication should be increased to 3 tablets a day.

## 2020-11-30 NOTE — Telephone Encounter (Signed)
Left message for return call to office.  

## 2020-12-01 ENCOUNTER — Ambulatory Visit: Payer: Medicare Other

## 2020-12-06 ENCOUNTER — Other Ambulatory Visit: Payer: Self-pay

## 2020-12-06 ENCOUNTER — Ambulatory Visit: Payer: Medicare Other

## 2020-12-06 DIAGNOSIS — M6281 Muscle weakness (generalized): Secondary | ICD-10-CM

## 2020-12-06 DIAGNOSIS — R2689 Other abnormalities of gait and mobility: Secondary | ICD-10-CM

## 2020-12-06 NOTE — Therapy (Signed)
Outpatient Physical Therapy 10 visit Progress Note  Patient Name: Emily Cannon MRN: [id number] DOB:[birth year], 87 y.o., female Today's Date: 12/06/2020   PT End of Session - 12/06/20 1516    Visit Number 13    Number of Visits 21    Date for PT Re-Evaluation 12/15/20    Authorization Type 10 v PN on 11/22/20    PT Start Time 1450    PT Stop Time 1530    PT Time Calculation (min) 40 min    Equipment Utilized During Treatment Gait belt    Activity Tolerance Patient tolerated treatment well    Behavior During Therapy WFL for tasks assessed/performed           PCP: Eulas Post, MD Referring Provier: Marcial Pacas, MD  Referring Diagnosis: R26.9 (ICD-10-CM) - Gait abnormality M35.9,G63 (ICD-10-CM) - 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 Allergic rhinitis, cause unspecified   . Allergy   . Anxiety state, unspecified   . Blood transfusion without reported diagnosis    2020  . Cataract    removed both eyes   . Chronic kidney disease   . Disorder of bone and cartilage, unspecified   . Esophageal candidiasis ([place])   . Foot drop   . Gastric ulcer   . History of CMV   . History of Helicobacter pylori infection   . Hypothyroid   . Irritable bowel syndrome   . Lumbago   . Microscopic polyangiitis ([place])   . Other and unspecified hyperlipidemia   . Perforation of tympanic membrane, unspecified   . Unspecified essential hypertension    Past Surgical History:  Procedure Laterality Date  . ABDOMINAL HYSTERECTOMY    . BIOPSY  01/02/2019   Procedure: BIOPSY;  Surgeon: Yetta Flock, MD;  Location[facility] END[facility]COPY;  Service: Gastroenterology;;  . cataract surgery  5/12 and 12/24/2012   both eyes  . COLONOSCOPY    . COLONOSCOPY WITH PROPOFOL N/A 01/02/2019   Procedure: COLONOSCOPY WITH PROPOFOL;  Surgeon: Yetta Flock, MD;  Location: [place];  Service: Gastroenterology;  Laterality: N/A;  . ESOPHAGOGASTRODUODENOSCOPY (EGD) WITH PROPOFOL N/A 01/02/2019   Procedure: ESOPHAGOGASTRODUODENOSCOPY (EGD) WITH PROPOFOL;  Surgeon: Yetta Flock, MD;  Location: [place];  Service: Gastroenterology;  Laterality: N/A;  . NSVD     x1  . POLYPECTOMY  01/02/2019   Procedure: POLYPECTOMY;  Surgeon: Yetta Flock, MD;  Location: [facility] ENDOSCOPY;  Service: Gastroenterology;;  . POLYPECTOMY    . TONSILLECTOMY    . UPPER GASTROINTESTINAL ENDOSCOPY     Patient Active Problem List   Diagnosis Date Noted  . 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 Peripheral neuropathy 05/08/2020  . Weakness 03/28/2020  . Gait abnormality 03/28/2020  . Paresthesia 03/28/2020  . Left foot drop 03/23/2020  . Heme positive stool   . Benign neoplasm of colon   . Acute blood loss anemia 12/30/2018  . Symptomatic anemia 12/15/2018  . AKI (acute kidney injury) ([place]) 12/15/2018  . Hypomagnesemia 12/15/2018  . Steroid-induced hyperglycemia 12/15/2018  . Microscopic polyangiitis ([place]) 11/13/2018  . Positive P-ANCA titer 10/14/2018  . Viral URI 05/12/2018  . Syncope 06/05/2016  . Unspecified venous (peripheral) insufficiency 11/23/2012  . Dermatitis 11/10/2012  . Hypothyroidism 05/22/2011  . TYMPANIC MEMBRANE PERFORATION, LEFT EAR 08/08/2007  . Osteopenia 07/27/2007  . Hyperlipidemia 05/11/2007  . Anxiety  state 05/11/2007  . Essential hypertension 05/11/2007  . ALLERGIC RHINITIS 05/11/2007  . Irritable bowel syndrome 05/11/2007  . LOW BACK PAIN 05/11/2007  . HYSTERECTOMY, HX OF 05/11/2007    SUBJECTIVE: No new symptoms to report  Pain: is patient experiencing pain? No.    OBJECTIVE:   Today's Treatment:  12/06/20 Gait training: 1 x 220' with st. Cane with CGA, 1 x 220' with cues for 10% faster Standing deadlift: 20lb KB from floor: 2 x 5 Floor transfers: using bil UE:  CGA x 3 Going up and down ramp: fwd and bwd: incline and decline: 5x each way with st. Cane and min A   11/29/20:  Gait training: st. Cane with CGA: 1 x 420' with cognitive tasks of naming 7 fruits that are red, pt able to name 5/7 without significant gait deviations Going up and down ramp: fwd and bwd: incline and decline: 5x each way with st. Cane and min A Up and down curb steps with cane and min A: verbal cues for sequencing: 1x Up and down stairs: 3x with cane in R hand and one rail; 3 x with cane in L hand and one rail with 4 steps. Total 24 steps Grade II-III rear foot mobilization mid foot mobilization Passive foot stretching into dorsiflexion: 3 x 20" L only 11/24/20: Gait training: st. Cane: CGA 1 x 874' Stair training: cane and one rail: - going up with R and down with L: 4 x 4 steps with 2x with rail on R and 2x with rail on L - Going up with L and down with R (to work on weaker leg strength): cane and one rail on L going up: 16 steps Gait training: stepping over 6" hurdles: 2 hurdles: leading with R leg 6x, leading with L leg 6x with cane and CGA to min A occaisonal Gait training: walking through 10 feet of gravel and 10 feet of mulch with stepping u and down 2" curb step.  11/22/20: Sit to stand: 5x Gait training: 1 x 210' with st. Kasandra Knudsen, 1 x 210' with horizontal and vertical head turns with st. Cane  Gait training: RW: 1 x 1527' with RW, cues to keep walker closer, stand up straight, cues for keeping elbows clower to her side and improving heel to toe gait Discussed walking program: printed calendar for patient. Starting with 10 min of walking per day and then increasing by 2 min every week. Walking with RW and husband      Patient Education:  Education details: Educated pt and spouse on how to properly don/doff ankle brace. Do proximal strap first then medial (tight) and lateral (without tension). New velcro strap placed on shin pad of R AFO as it was coming off. Person  educated: Patient and Spouse Education method: Explanation Education comprehension: verbalized understanding   Home Exercise Program: Access Code: [email or web address] URL: [email or web address] Date: 10/13/2020 Prepared by: Markus Jarvis  Exercises Straight Leg Raise - 1 x daily - 7 x weekly - 2 sets - 10 reps Supine Bridge - 1 x daily - 7 x weekly - 2 sets - 10 reps Sidelying Hip Abduction - 1 x daily - 7 x weekly - 2 sets - 10 reps Seated Knee Extension with Resistance - 1 x daily - 7 x weekly - 2 sets - 10 reps Seated Hamstring Curl with Anchored Resistance - 1 x daily - 7 x weekly - 2 sets - 10 reps   Assessment: Clinical impression:  Pt is dmeonstrating gradual  comfort with walking with cane. She is able to talk and wlak with cane with mild head turns without loosing balance. She is challanged more with incline and decline and requires min A. She was able to pick up 20 lb weight from floor with good balance but required supervision for safety  Rehab potential: Good  Clinical decision making: Stable/uncomplicated  Evaluation complexity: Moderate   Goals: Goals reviewed with patient? Yes  SHORT TERM GOALS:  STG Name Target Date Goal status  1 Pt will be be able to ambulate 1200' with RW to improve walking endurance Comments: Eval = 693' with RW; 1527' with RW (11/22/20) 11/03/20 Goal met   2 Pt will demo compliance with HEP to improve self management of symptoms. Comments: Eval = not issued; compliant 11/03/20 Goal met  3 Pt will get a shower chair and be able to sit and pivot into tub with min A from her husband. Got shower chair on 11/21/20 11/03/20 Goal met   LONG TERM GOALS:   LTG Name Target Date Goal status  1 Pt will be able to stand up from chair without HHA and without bracing knees against chair/bed to improve strength in bil LE Comments: Eval = needs to brace knees against chair to stand up; able to stand up but rquires CGA 12/01/20 Progressing 11/22/20   2 Pt will be able to ambulate >2000' with RW and without rest break to improve walking endurance Comments: Eval = 693' with RW;11/22/20 1527; 12/01/20 Progressing  3 Pt will be able to ambulate at least 800' with LRAD and without rest break to improve functional ambulation Comments: Eval = 693' with RW without rest break, 874' with st. Cane with CGA 12/01/20 Progressing   4 Pt will be able to don/doff her shoes/braces off at EOB to improve indepence. Comments: Eval =  Husband helps her with getting her shoes and braces and puts them on for her. Pt able to don/doff shoes but due to short legs her legs dangle and pt doesn't feel comfortable. 12/01/20 INITIAL   PLAN: PT frequency: 2x/week  PT duration: 10 weeks  Planned Interventions: Therapeutic exercises, Therapeutic activity, Neuro Muscular re-education, Balance training, Gait training, Patient/Family education, Joint mobilization, Stair training, Orthotic/Fit training, Cryotherapy and Moist heat  Plan for next session: Incorporate dual tasking with exercises, Continue to progress strengthening and balance exercises.    Kerrie Pleasure, PT 12/06/2020, [place] [address] [place], [place], [address] Phone: [phone]   Fax:  [phone]  Patient name: ALEJANDRIA WESSELLS MRN: [id number] DOB: 19-Oct-1932

## 2020-12-08 ENCOUNTER — Telehealth: Payer: Self-pay | Admitting: Family Medicine

## 2020-12-08 ENCOUNTER — Ambulatory Visit: Payer: Medicare Other

## 2020-12-08 ENCOUNTER — Other Ambulatory Visit: Payer: Self-pay

## 2020-12-08 DIAGNOSIS — M6281 Muscle weakness (generalized): Secondary | ICD-10-CM | POA: Diagnosis not present

## 2020-12-08 DIAGNOSIS — R2689 Other abnormalities of gait and mobility: Secondary | ICD-10-CM

## 2020-12-08 NOTE — Therapy (Signed)
Outpatient Physical Therapy 10 visit Progress Note  Patient Name: Emily Cannon MRN: [id number] DOB:[birth year], 87 y.o., female Today's Date: 12/08/2020   PT End of Session - 12/08/20 1412    Visit Number 14    Number of Visits 21    Date for PT Re-Evaluation 12/15/20    Authorization Type 10 v PN on 11/22/20    PT Start Time 1410    PT Stop Time 1450    PT Time Calculation (min) 40 min    Equipment Utilized During Treatment Gait belt    Activity Tolerance Patient tolerated treatment well    Behavior During Therapy WFL for tasks assessed/performed           PCP: Eulas Post, MD Referring Provier: Marcial Pacas, MD  Referring Diagnosis: R26.9 (ICD-10-CM) - Gait abnormality M35.9,G63 (ICD-10-CM) - 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 Allergic rhinitis, cause unspecified   . Allergy   . Anxiety state, unspecified   . Blood transfusion without reported diagnosis    2020  . Cataract    removed both eyes   . Chronic kidney disease   . Disorder of bone and cartilage, unspecified   . Esophageal candidiasis ([place])   . Foot drop   . Gastric ulcer   . History of CMV   . History of Helicobacter pylori infection   . Hypothyroid   . Irritable bowel syndrome   . Lumbago   . Microscopic polyangiitis ([place])   . Other and unspecified hyperlipidemia   . Perforation of tympanic membrane, unspecified   . Unspecified essential hypertension    Past Surgical History:  Procedure Laterality Date  . ABDOMINAL HYSTERECTOMY    . BIOPSY  01/02/2019   Procedure: BIOPSY;  Surgeon: Yetta Flock, MD;  Location: [facility] ENDOSCOPY;  Service: Gastroenterology;;  . cataract surgery  5/12 and 12/24/2012   both eyes  . COLONOSCOPY    . COLONOSCOPY WITH PROPOFOL N/A 01/02/2019   Procedure: COLONOSCOPY WITH PROPOFOL;  Surgeon: Yetta Flock, MD;  Location: [place];  Service: Gastroenterology;  Laterality: N/A;  . ESOPHAGOGASTRODUODENOSCOPY (EGD) WITH PROPOFOL N/A 01/02/2019   Procedure: ESOPHAGOGASTRODUODENOSCOPY (EGD) WITH PROPOFOL;  Surgeon: Yetta Flock, MD;  Location: [place];  Service: Gastroenterology;  Laterality: N/A;  . NSVD     x1  . POLYPECTOMY  01/02/2019   Procedure: POLYPECTOMY;  Surgeon: Yetta Flock, MD;  Location: [facility] ENDOSCOPY;  Service: Gastroenterology;;  . POLYPECTOMY    . TONSILLECTOMY    . UPPER GASTROINTESTINAL ENDOSCOPY     Patient Active Problem List   Diagnosis Date Noted  . 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 Peripheral neuropathy 05/08/2020  . Weakness 03/28/2020  . Gait abnormality 03/28/2020  . Paresthesia 03/28/2020  . Left foot drop 03/23/2020  . Heme positive stool   . Benign neoplasm of colon   . Acute blood loss anemia 12/30/2018  . Symptomatic anemia 12/15/2018  . AKI (acute kidney injury) ([place]) 12/15/2018  . Hypomagnesemia 12/15/2018  . Steroid-induced hyperglycemia 12/15/2018  . Microscopic polyangiitis ([place]) 11/13/2018  . Positive P-ANCA titer 10/14/2018  . Viral URI 05/12/2018  . Syncope 06/05/2016  . Unspecified venous (peripheral) insufficiency 11/23/2012  . Dermatitis 11/10/2012  . Hypothyroidism 05/22/2011  . TYMPANIC MEMBRANE PERFORATION, LEFT EAR 08/08/2007  . Osteopenia 07/27/2007  . Hyperlipidemia 05/11/2007  . Anxiety  state 05/11/2007  . Essential hypertension 05/11/2007  . ALLERGIC RHINITIS 05/11/2007  . Irritable bowel syndrome 05/11/2007  . LOW BACK PAIN 05/11/2007  . HYSTERECTOMY, HX OF 05/11/2007    SUBJECTIVE: No new symptoms to report. She was little tired after last session.  Pain: is patient experiencing pain? No.    OBJECTIVE:   Today's Treatment:  12/08/20: Bed to chair with 180 deg turn: 10x without any AD and CGA, cues to lean fwd more when sitting down. Standing with  wide BOS: with bouching 65 cm swiss ball on floor and catching it: 20x Standing daedlift: 25lbs 2 x 5 Squat to 8" box with holding onto mat table: 2 x 5 with bil UE hold on bottom of mat.  12/06/20 Gait training: 1 x 220' with st. Cane with CGA, 1 x 220' with cues for 10% faster Standing deadlift: 20lb KB from floor: 2 x 5 Floor transfers: using bil UE: CGA x 3 Going up and down ramp: fwd and bwd: incline and decline: 5x each way with st. Cane and min A   11/29/20:  Gait training: st. Cane with CGA: 1 x 420' with cognitive tasks of naming 7 fruits that are red, pt able to name 5/7 without significant gait deviations Going up and down ramp: fwd and bwd: incline and decline: 5x each way with st. Cane and min A Up and down curb steps with cane and min A: verbal cues for sequencing: 1x Up and down stairs: 3x with cane in R hand and one rail; 3 x with cane in L hand and one rail with 4 steps. Total 24 steps Grade II-III rear foot mobilization mid foot mobilization Passive foot stretching into dorsiflexion: 3 x 20" L only   Patient Education:  Education details: Pt educated on practicing chair to chair transfer using a cane 4x with her husband present next to her. Person educated: Patient and Spouse Education method: Explanation Education comprehension: verbalized understanding   Home Exercise Program: Access Code: [email or web address] URL: [email or web address] Date: 10/13/2020 Prepared by: Markus Jarvis  Exercises Straight Leg Raise - 1 x daily - 7 x weekly - 2 sets - 10 reps Supine Bridge - 1 x daily - 7 x weekly - 2 sets - 10 reps Sidelying Hip Abduction - 1 x daily - 7 x weekly - 2 sets - 10 reps Seated Knee Extension with Resistance - 1 x daily - 7 x weekly - 2 sets - 10 reps Seated Hamstring Curl with Anchored Resistance - 1 x daily - 7 x weekly - 2 sets - 10 reps   Assessment: Clinical impression:  Tolerated session well. Pt required min A 1x during chair to chair  transfer exercises.  Rehab potential: Good  Clinical decision making: Stable/uncomplicated  Evaluation complexity: Moderate   Goals: Goals reviewed with patient? Yes  SHORT TERM GOALS:  STG Name Target Date Goal status  1 Pt will be be able to ambulate 1200' with RW to improve walking endurance Comments: Eval = 693' with RW; 1527' with RW (11/22/20) 11/03/20 Goal met   2 Pt will demo compliance with HEP to improve self management of symptoms. Comments: Eval = not issued; compliant 11/03/20 Goal met  3 Pt will get a shower chair and be able to sit and pivot into tub with min A from her husband. Got shower chair on 11/21/20 11/03/20 Goal met   LONG TERM GOALS:   LTG Name Target Date Goal status  1 Pt will be able to  stand up from chair without HHA and without bracing knees against chair/bed to improve strength in bil LE Comments: Eval = needs to brace knees against chair to stand up; able to stand up but rquires CGA 12/01/20 Progressing 11/22/20  2 Pt will be able to ambulate >2000' with RW and without rest break to improve walking endurance Comments: Eval = 693' with RW;11/22/20 1527; 12/01/20 Progressing  3 Pt will be able to ambulate at least 800' with LRAD and without rest break to improve functional ambulation Comments: Eval = 693' with RW without rest break, 874' with st. Cane with CGA 12/01/20 Progressing   4 Pt will be able to don/doff her shoes/braces off at EOB to improve indepence. Comments: Eval =  Husband helps her with getting her shoes and braces and puts them on for her. Pt able to don/doff shoes but due to short legs her legs dangle and pt doesn't feel comfortable. 12/01/20 INITIAL   PLAN: PT frequency: 2x/week  PT duration: 10 weeks  Planned Interventions: Therapeutic exercises, Therapeutic activity, Neuro Muscular re-education, Balance training, Gait training, Patient/Family education, Joint mobilization, Stair training, Orthotic/Fit training, Cryotherapy and Moist  heat  Plan for next session: Incorporate dual tasking with exercises, Continue to progress strengthening and balance exercises.    Kerrie Pleasure, PT 12/08/2020, [place] [address] [place], [place], [address] Phone: [phone]   Fax:  [phone]  Patient name: LAKETIA VICKNAIR MRN: [id number] DOB: August 16, 1932

## 2020-12-08 NOTE — Telephone Encounter (Signed)
Spoke with patient to  schedule Medicare Annual Wellness Visit (AWV) either virtually or in office.  Pt stated she would call back to schedule   Last AWV 08/23/19  please schedule at anytime with [facility] Nurse Health Advisor 1 or 2   This should be a 45 minute visit.

## 2020-12-13 ENCOUNTER — Ambulatory Visit: Payer: Medicare Other

## 2020-12-13 ENCOUNTER — Other Ambulatory Visit: Payer: Self-pay

## 2020-12-13 DIAGNOSIS — M6281 Muscle weakness (generalized): Secondary | ICD-10-CM

## 2020-12-13 DIAGNOSIS — R2689 Other abnormalities of gait and mobility: Secondary | ICD-10-CM

## 2020-12-13 NOTE — Therapy (Signed)
Outpatient Physical Therapy 10 visit Progress Note  Patient Name: Emily Cannon MRN: [id number] DOB:1933-06-04, 87 y.o., female Today's Date: 12/13/2020   PT End of Session - 12/13/20 1510    Visit Number 15    Number of Visits 21    Date for PT Re-Evaluation 12/15/20    Authorization Type 10 v PN on 11/22/20    PT Start Time 1450    PT Stop Time 1535    PT Time Calculation (min) 45 min    Equipment Utilized During Treatment Gait belt    Activity Tolerance Patient tolerated treatment well    Behavior During Therapy WFL for tasks assessed/performed           PCP: Eulas Post, MD Referring Provier: Marcial Pacas, MD  Referring Diagnosis: R26.9 (ICD-10-CM) - Gait abnormality M35.9,G63 (ICD-10-CM) - 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 Allergic rhinitis, cause unspecified   . Allergy   . Anxiety state, unspecified   . Blood transfusion without reported diagnosis    2020  . Cataract    removed both eyes   . Chronic kidney disease   . Disorder of bone and cartilage, unspecified   . Esophageal candidiasis ([place])   . Foot drop   . Gastric ulcer   . History of CMV   . History of Helicobacter pylori infection   . Hypothyroid   . Irritable bowel syndrome   . Lumbago   . Microscopic polyangiitis ([place])   . Other and unspecified hyperlipidemia   . Perforation of tympanic membrane, unspecified   . Unspecified essential hypertension    Past Surgical History:  Procedure Laterality Date  . ABDOMINAL HYSTERECTOMY    . BIOPSY  01/02/2019   Procedure: BIOPSY;  Surgeon: Yetta Flock, MD;  Location: [facility] ENDOSCOPY;  Service: Gastroenterology;;  . cataract surgery  5/12 and 12/24/2012   both eyes  . COLONOSCOPY    . COLONOSCOPY WITH PROPOFOL N/A 01/02/2019   Procedure: COLONOSCOPY WITH PROPOFOL;  Surgeon: Yetta Flock, MD;  Location: [place];  Service: Gastroenterology;  Laterality: N/A;  . ESOPHAGOGASTRODUODENOSCOPY (EGD) WITH PROPOFOL N/A 01/02/2019   Procedure: ESOPHAGOGASTRODUODENOSCOPY (EGD) WITH PROPOFOL;  Surgeon: Yetta Flock, MD;  Location: [place];  Service: Gastroenterology;  Laterality: N/A;  . NSVD     x1  . POLYPECTOMY  01/02/2019   Procedure: POLYPECTOMY;  Surgeon: Yetta Flock, MD;  Location[facility] ENDOSCOPY;  Service: Gastroenterology;;  . POLYPECTOMY    . TONSILLECTOMY    . UPPER GASTROINTESTINAL ENDOSCOPY     Patient Active Problem List   Diagnosis Date Noted  . 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 Peripheral neuropathy 05/08/2020  . Weakness 03/28/2020  . Gait abnormality 03/28/2020  . Paresthesia 03/28/2020  . Left foot drop 03/23/2020  . Heme positive stool   . Benign neoplasm of colon   . Acute blood loss anemia 12/30/2018  . Symptomatic anemia 12/15/2018  . AKI (acute kidney injury) ([place]) 12/15/2018  . Hypomagnesemia 12/15/2018  . Steroid-induced hyperglycemia 12/15/2018  . Microscopic polyangiitis ([place]) 11/13/2018  . Positive P-ANCA titer 10/14/2018  . Viral URI 05/12/2018  . Syncope 06/05/2016  . Unspecified venous (peripheral) insufficiency 11/23/2012  . Dermatitis 11/10/2012  . Hypothyroidism 05/22/2011  . TYMPANIC MEMBRANE PERFORATION, LEFT EAR 08/08/2007  . Osteopenia 07/27/2007  . Hyperlipidemia 05/11/2007  . Anxiety  state 05/11/2007  . Essential hypertension 05/11/2007  . ALLERGIC RHINITIS 05/11/2007  . Irritable bowel syndrome 05/11/2007  . LOW BACK PAIN 05/11/2007  . HYSTERECTOMY, HX OF 05/11/2007    SUBJECTIVE: No new symptoms to report. She was little tired after last session.  Pain: is patient experiencing pain? No.    OBJECTIVE:   Today's Treatment:  4/58/09: (Recert Next session 9/83/38) Gait training: 1 x 1175' st. Cane with CGA and RW incase of fatigue. Pt required 2x  standing breaks for <10 seconds Standing up walking around 3x cones in figure 8 position with 180 deg turn and coming back around 3 cones before turning 180 deg and sitting back. Total 5x needed min A x 2 for LOB Gait training: 1 x 420' with st. Cane and SBA  12/08/20: Bed to chair with 180 deg turn: 10x without any AD and CGA, cues to lean fwd more when sitting down. Standing with wide BOS: with bouching 65 cm swiss ball on floor and catching it: 20x Standing daedlift: 25lbs 2 x 5 Squat to 8" box with holding onto mat table: 2 x 5 with bil UE hold on bottom of mat.  12/06/20 Gait training: 1 x 220' with st. Cane with CGA, 1 x 220' with cues for 10% faster Standing deadlift: 20lb KB from floor: 2 x 5 Floor transfers: using bil UE: CGA x 3 Going up and down ramp: fwd and bwd: incline and decline: 5x each way with st. Cane and min A   11/29/20:  Gait training: st. Cane with CGA: 1 x 420' with cognitive tasks of naming 7 fruits that are red, pt able to name 5/7 without significant gait deviations Going up and down ramp: fwd and bwd: incline and decline: 5x each way with st. Cane and min A Up and down curb steps with cane and min A: verbal cues for sequencing: 1x Up and down stairs: 3x with cane in R hand and one rail; 3 x with cane in L hand and one rail with 4 steps. Total 24 steps Grade II-III rear foot mobilization mid foot mobilization Passive foot stretching into dorsiflexion: 3 x 20" L only   Patient Education:  Education details: Pt educated on practicing chair to chair transfer using a cane 4x with her husband present next to her. Person educated: Patient and Spouse Education method: Explanation Education comprehension: verbalized understanding   Home Exercise Program: Access Code: [email or web address] URL: [email or web address] Date: 10/13/2020 Prepared by: Markus Jarvis  Exercises Straight Leg Raise - 1 x daily - 7 x weekly - 2 sets - 10 reps Supine Bridge - 1 x daily  - 7 x weekly - 2 sets - 10 reps Sidelying Hip Abduction - 1 x daily - 7 x weekly - 2 sets - 10 reps Seated Knee Extension with Resistance - 1 x daily - 7 x weekly - 2 sets - 10 reps Seated Hamstring Curl with Anchored Resistance - 1 x daily - 7 x weekly - 2 sets - 10 reps   Assessment: Clinical impression:  Pt demonstrated increased ambulation distance with cane but requires CGA for safety. Pt requires min A when walking around obstacles without AD. Overall pt is gaining more confidence when walking with lesser AD.  Rehab potential: Good  Clinical decision making: Stable/uncomplicated  Evaluation complexity: Moderate   Goals: Goals reviewed with patient? Yes  SHORT TERM GOALS:  STG Name Target Date Goal status  1 Pt will be be able to ambulate 1200' with  RW to improve walking endurance Comments: Eval = 693' with RW; 1527' with RW (11/22/20) 11/03/20 Goal met   2 Pt will demo compliance with HEP to improve self management of symptoms. Comments: Eval = not issued; compliant 11/03/20 Goal met  3 Pt will get a shower chair and be able to sit and pivot into tub with min A from her husband. Got shower chair on 11/21/20 11/03/20 Goal met   LONG TERM GOALS:   LTG Name Target Date Goal status  1 Pt will be able to stand up from chair without HHA and without bracing knees against chair/bed to improve strength in bil LE Comments: Eval = needs to brace knees against chair to stand up; able to stand up but rquires CGA 12/01/20 Progressing 11/22/20  2 Pt will be able to ambulate >2000' with RW and without rest break to improve walking endurance Comments: Eval = 693' with RW;11/22/20 1527; 12/01/20 Progressing  3 Pt will be able to ambulate at least 800' with LRAD and without rest break to improve functional ambulation Comments: Eval = 693' with RW without rest break, 874' with st. Cane with CGA, 1175' with st. Cane with CGA (12/13/20) 12/01/20 Progressing   4 Pt will be able to don/doff her  shoes/braces off at EOB to improve indepence. Comments: Eval =  Husband helps her with getting her shoes and braces and puts them on for her. Pt able to don/doff shoes but due to short legs her legs dangle and pt doesn't feel comfortable. 12/01/20 INITIAL   PLAN: PT frequency: 2x/week  PT duration: 10 weeks  Planned Interventions: Therapeutic exercises, Therapeutic activity, Neuro Muscular re-education, Balance training, Gait training, Patient/Family education, Joint mobilization, Stair training, Orthotic/Fit training, Cryotherapy and Moist heat  Plan for next session: Recert next session.   Kerrie Pleasure, PT 12/13/2020, [facility] [address] [place], [place], [address] Phone: [phone]   Fax:  [phone]  Patient name: ZURIAH BORDAS MRN: [id number] DOB: 07-01-1933

## 2020-12-15 ENCOUNTER — Ambulatory Visit: Payer: Medicare Other

## 2020-12-15 ENCOUNTER — Other Ambulatory Visit: Payer: Self-pay

## 2020-12-15 DIAGNOSIS — R2689 Other abnormalities of gait and mobility: Secondary | ICD-10-CM

## 2020-12-15 DIAGNOSIS — M6281 Muscle weakness (generalized): Secondary | ICD-10-CM | POA: Diagnosis not present

## 2020-12-15 NOTE — Therapy (Signed)
Outpatient Physical Recertification Note  Patient Name: Emily Cannon MRN: [id number] DOB:1933/04/21, 87 y.o., female Today's Date: 12/15/2020   PT End of Session - 12/15/20 1419    Visit Number 16    Number of Visits 28    Date for PT Re-Evaluation 02/09/21    Authorization Type 10 v PN on 11/22/20; Recert on 8/33/82-11/30/37 (2x/week for 12 sessions)    PT Start Time 1410    PT Stop Time 1455    PT Time Calculation (min) 45 min    Equipment Utilized During Treatment Gait belt    Activity Tolerance Patient tolerated treatment well    Behavior During Therapy WFL for tasks assessed/performed           PCP: Eulas Post, MD Referring Provier: Marcial Pacas, MD  Referring Diagnosis: R26.9 (ICD-10-CM) - Gait abnormality M35.9,G63 (ICD-10-CM) - 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 Allergic rhinitis, cause unspecified   . Allergy   . Anxiety state, unspecified   . Blood transfusion without reported diagnosis    2020  . Cataract    removed both eyes   . Chronic kidney disease   . Disorder of bone and cartilage, unspecified   . Esophageal candidiasis ([place])   . Foot drop   . Gastric ulcer   . History of CMV   . History of Helicobacter pylori infection   . Hypothyroid   . Irritable bowel syndrome   . Lumbago   . Microscopic polyangiitis ([place])   . Other and unspecified hyperlipidemia   . Perforation of tympanic membrane, unspecified   . Unspecified essential hypertension    Past Surgical History:  Procedure Laterality Date  . ABDOMINAL HYSTERECTOMY    . BIOPSY  01/02/2019   Procedure: BIOPSY;  Surgeon: Yetta Flock, MD;  Location: [facility] ENDOSCOPY;  Service: Gastroenterology;;  . cataract surgery  5/12 and 12/24/2012   both eyes  . COLONOSCOPY    . COLONOSCOPY WITH PROPOFOL N/A 01/02/2019   Procedure:  COLONOSCOPY WITH PROPOFOL;  Surgeon: Yetta Flock, MD;  Location: [place];  Service: Gastroenterology;  Laterality: N/A;  . ESOPHAGOGASTRODUODENOSCOPY (EGD) WITH PROPOFOL N/A 01/02/2019   Procedure: ESOPHAGOGASTRODUODENOSCOPY (EGD) WITH PROPOFOL;  Surgeon: Yetta Flock, MD;  Location: [place];  Service: Gastroenterology;  Laterality: N/A;  . NSVD     x1  . POLYPECTOMY  01/02/2019   Procedure: POLYPECTOMY;  Surgeon: Yetta Flock, MD;  Location: [facility] ENDOSCOPY;  Service: Gastroenterology;;  . POLYPECTOMY    . TONSILLECTOMY    . UPPER GASTROINTESTINAL ENDOSCOPY     Patient Active Problem List   Diagnosis Date Noted  . 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 Peripheral neuropathy 05/08/2020  . Weakness 03/28/2020  . Gait abnormality 03/28/2020  . Paresthesia 03/28/2020  . Left foot drop 03/23/2020  . Heme positive stool   . Benign neoplasm of colon   . Acute blood loss anemia 12/30/2018  . Symptomatic anemia 12/15/2018  . AKI (acute kidney injury) ([place]) 12/15/2018  . Hypomagnesemia 12/15/2018  . Steroid-induced hyperglycemia 12/15/2018  . Microscopic polyangiitis ([place]) 11/13/2018  . Positive P-ANCA titer 10/14/2018  . Viral URI 05/12/2018  . Syncope 06/05/2016  . Unspecified venous (peripheral) insufficiency 11/23/2012  . Dermatitis 11/10/2012  . Hypothyroidism 05/22/2011  . TYMPANIC MEMBRANE PERFORATION, LEFT EAR 08/08/2007  . Osteopenia 07/27/2007  . Hyperlipidemia  05/11/2007  . Anxiety state 05/11/2007  . Essential hypertension 05/11/2007  . ALLERGIC RHINITIS 05/11/2007  . Irritable bowel syndrome 05/11/2007  . LOW BACK PAIN 05/11/2007  . HYSTERECTOMY, HX OF 05/11/2007    SUBJECTIVE: No changes since last session.  Pain: is patient experiencing pain? No.    OBJECTIVE:  Modified CTSIB: Test 1: 30 sec Test 2: 30 sec Test 3: 30 sec Test 4: 10 sec Total: 100 sec/120 sec  Today's Treatment:   12/15/20: Gait training: 1  x220' without AD and CGA for safety; 1 x 440' without AD with stepping over 4 x 2" hurdles x 4" wide Standing on foam with rigid top: stepping up and stepping off required min A, standing for 2' with CGA to min A 2 x 2' Stepping up on 4" box: no HHA, min A for PT required for LOB: 2 x 10 R and L  12/13/20: Gait training: 1 x 1175' st. Cane with CGA and RW incase of fatigue. Pt required 2x standing breaks for <10 seconds Standing up walking around 3x cones in figure 8 position with 180 deg turn and coming back around 3 cones before turning 180 deg and sitting back. Total 5x needed min A x 2 for LOB Gait training: 1 x 420' with st. Cane and SBA  12/08/20: Bed to chair with 180 deg turn: 10x without any AD and CGA, cues to lean fwd more when sitting down. Standing with wide BOS: with bouching 65 cm swiss ball on floor and catching it: 20x Standing daedlift: 25lbs 2 x 5 Squat to 8" box with holding onto mat table: 2 x 5 with bil UE hold on bottom of mat.     Patient Education:  Education details: Pt educated on practicing chair to chair transfer using a cane 4x with her husband present next to her. Person educated: Patient and Spouse Education method: Explanation Education comprehension: verbalized understanding   Home Exercise Program: Access Code: [email or web address] URL: [email or web address] Date: 10/13/2020 Prepared by: Markus Jarvis  Exercises Straight Leg Raise - 1 x daily - 7 x weekly - 2 sets - 10 reps Supine Bridge - 1 x daily - 7 x weekly - 2 sets - 10 reps Sidelying Hip Abduction - 1 x daily - 7 x weekly - 2 sets - 10 reps Seated Knee Extension with Resistance - 1 x daily - 7 x weekly - 2 sets - 10 reps Seated Hamstring Curl with Anchored Resistance - 1 x daily - 7 x weekly - 2 sets - 10 reps   Assessment: Clinical impression:  Pt has been seen for total of 16 sessions since 10/06/20 to 12/15/20. Patient has met all of her short term  goals and 2/5 long term goals. Pt needs skilled PT to progress her from using RW to straight cane to improve functional gait and balance with least restrictive assistive device. Pt continues to have trouble with balance on uneven grounds or when vision is taken out. Patient will continue to benefit from skilled PT.  Rehab potential: Good  Clinical decision making: Stable/uncomplicated  Evaluation complexity: Moderate   Goals: Goals reviewed with patient? Yes  SHORT TERM GOALS:  STG Name Target Date Goal status  1 Pt will be be able to ambulate 1200' with RW to improve walking endurance Comments: Eval = 693' with RW; 1527' with RW (11/22/20) 11/03/20 Goal met   2 Pt will demo compliance with HEP to improve self management of symptoms. Comments: Eval = not issued; compliant  11/03/20 Goal met  3 Pt will get a shower chair and be able to sit and pivot into tub with min A from her husband. Got shower chair on 11/21/20 11/03/20 Goal met   LONG TERM GOALS:   LTG Name Target Date Goal status  1 Pt will be able to stand up from chair without HHA and without bracing knees against chair/bed to improve strength in bil LE Comments: Eval = needs to brace knees against chair to stand up; able to stand up but rquires CGA; able to stand without bracing knees (12/15/20) 12/01/20 Goal met 12/15/20  2 Pt will be able to ambulate >2000' with RW and without rest break to improve walking endurance Comments: Eval = 693' with RW;11/22/20 1527; 12/01/20 Goal met 12/15/20  3 Pt will be able to ambulate at least 800' with LRAD and without rest break to improve functional ambulation Comments: Eval = 693' with RW without rest break, 874' with st. Cane with CGA, 1175' with st. Cane with CGA (12/13/20); 1050' with st cane and CGA (12/15/20) 02/09/2021   Progressing 12/15/20  4 Pt will be able to don/doff her shoes/braces off at EOB to improve indepence. Comments: Eval =  Husband helps her with getting her shoes and braces and  puts them on for her. Pt able to don/doff shoes but due to short legs her legs dangle and pt doesn't feel comfortable. 02/09/21 Progressing continue  12/15/20  5.  Pt will demo 120/120 sec on Modified CTSIB to improve standing balance on uneven surfaces 02/09/21 Revised   PLAN: PT frequency: 2x/week  PT duration: 8weeks  Planned Interventions: Therapeutic exercises, Therapeutic activity, Neuro Muscular re-education, Balance training, Gait training, Patient/Family education, Joint mobilization, Stair training, Orthotic/Fit training, Cryotherapy and Moist heat  Plan for next session: EC on uneven surfaces   Kerrie Pleasure, PT 12/15/2020, [facility] [address] [place], [place], [address] Phone: [phone]   Fax:  [phone]  Patient name: Emily Cannon MRN: [id number] DOB: [birth year]

## 2020-12-20 ENCOUNTER — Ambulatory Visit: Payer: Medicare Other

## 2020-12-21 ENCOUNTER — Telehealth: Payer: Self-pay | Admitting: Family Medicine

## 2020-12-21 NOTE — Telephone Encounter (Signed)
Aika Brzoska called to let Dr. Elease Hashimoto know that the patient tested positive for COVID yesterday and he would like some guidance to help the patient because she is also having blood infusions.  Please advise

## 2020-12-21 NOTE — Telephone Encounter (Signed)
Spoke with patient and husband, will call office to schedule appointment.

## 2020-12-21 NOTE — Telephone Encounter (Signed)
Pts spouse is calling in to check the status of the below msg and was b/c they have not heard anything and was told that they would hear something before lunch.  Spouse is aware the provider is out of the office on Thursday and they will get in touch with them on Friday 12/22/2020.

## 2020-12-22 ENCOUNTER — Other Ambulatory Visit: Payer: Self-pay

## 2020-12-22 ENCOUNTER — Telehealth (INDEPENDENT_AMBULATORY_CARE_PROVIDER_SITE_OTHER): Payer: Medicare Other | Admitting: Family Medicine

## 2020-12-22 ENCOUNTER — Ambulatory Visit: Payer: Medicare Other

## 2020-12-22 DIAGNOSIS — U071 COVID-19: Secondary | ICD-10-CM | POA: Diagnosis not present

## 2020-12-22 NOTE — Progress Notes (Signed)
Patient ID: [name], female   DOB: Jul 21, 1933, 87 y.o.   MRN: [id number]   This visit type was conducted due to national recommendations for restrictions regarding the COVID-19 pandemic in an effort to limit this patient's exposure and mitigate transmission in our community.   Virtual Visit via Telephone Note  I connected with Emily Cannon on 12/22/20 at  3:30 PM EDT by telephone and verified that I am speaking with the correct person using two identifiers.   I discussed the limitations, risks, security and privacy concerns of performing an evaluation and management service by telephone and the availability of in person appointments. I also discussed with the patient that there may be a patient responsible charge related to this service. The patient expressed understanding and agreed to proceed.  Location patient: home Location provider: work or home office Participants present for the call: patient, provider Patient did not have a visit in the prior 7 days to address this/these issue(s).     History of Present Illness:    Emily Cannon has COVID-19.  She states that she tested +2 days ago.  Her symptoms actually started about 5 and possibly 6 days ago.  She had 1 day of some mild nausea earlier this week but none now.  Very minimal cough.  No dyspnea.  No fever.  Minimal nasal congestion.  She is immunocompromise in terms of her age and history of microscopic polyangiitis as well as multiple other chronic medical problems.  She has been fully vaccinated with Moderna vaccine and also has had Evusheld prophylactic injection.  She feels that she is doing reasonably well overall.  Her husband has had only minimal symptoms and he is not tested at this point.  Past Medical History:  Diagnosis Date  . Allergic rhinitis, cause unspecified   . Allergy   . Anxiety state, unspecified   . Blood transfusion without reported diagnosis    2020  . Cataract    removed both eyes   . Chronic  kidney disease   . Disorder of bone and cartilage, unspecified   . Esophageal candidiasis ([place])   . Foot drop   . Gastric ulcer   . History of CMV   . History of Helicobacter pylori infection   . Hypothyroid   . Irritable bowel syndrome   . Lumbago   . Microscopic polyangiitis ([place])   . Other and unspecified hyperlipidemia   . Perforation of tympanic membrane, unspecified   . Unspecified essential hypertension    Past Surgical History:  Procedure Laterality Date  . ABDOMINAL HYSTERECTOMY    . BIOPSY  01/02/2019   Procedure: BIOPSY;  Surgeon: Yetta Flock, MD;  Location: [facility] ENDOSCOPY;  Service: Gastroenterology;;  . cataract surgery  5/12 and 12/24/2012   both eyes  . COLONOSCOPY    . COLONOSCOPY WITH PROPOFOL N/A 01/02/2019   Procedure: COLONOSCOPY WITH PROPOFOL;  Surgeon: Yetta Flock, MD;  Location: [place];  Service: Gastroenterology;  Laterality: N/A;  . ESOPHAGOGASTRODUODENOSCOPY (EGD) WITH PROPOFOL N/A 01/02/2019   Procedure: ESOPHAGOGASTRODUODENOSCOPY (EGD) WITH PROPOFOL;  Surgeon: Yetta Flock, MD;  Location: [place];  Service: Gastroenterology;  Laterality: N/A;  . NSVD     x1  . POLYPECTOMY  01/02/2019   Procedure: POLYPECTOMY;  Surgeon: Yetta Flock, MD;  Location: [facility] ENDOSCOPY;  Service: Gastroenterology;;  . POLYPECTOMY    . TONSILLECTOMY    . UPPER GASTROINTESTINAL ENDOSCOPY      reports that she has never smoked. She has  never used smokeless tobacco. She reports that she does not drink alcohol and does not use drugs. family history includes COPD in an other family member; Lung cancer (age of onset: 68) in her mother; Lung disease (age of onset: 15) in her father. Allergies  Allergen Reactions  . Alendronate Sodium Shortness Of Breath  . Amoxicillin Diarrhea    Severe diarrhea  . Codeine Nausea Only  . Valsartan Swelling    Gums/throat swell    Observations/Objective: Patient sounds cheerful and well on the phone. I do  not appreciate any SOB. Speech and thought processing are grossly intact. Patient reported vitals:  Assessment and Plan:  COVID-19 infection.  Patient does have increased risk in terms of age and multiple comorbidities but has had prophylactic medication with Evusheld and overall doing very well at this time.  Also, she is possibly 6 days into symptoms and thus has less potential benefit from antiviral medications.  Given the above we have elected to observe for now.  She knows to go to the ER immediately for increased shortness of breath or other concerns  We also discussed the fact that she may need more prolonged isolation because of her immunocompromise state.  They plan to isolate for a total of 20 days  Follow Up Instructions:  -As above   [id number] 5-10 [id number] 11-20 [id number] 21-30 I did not refer this patient for an OV in the next 24 hours for this/these issue(s).  I discussed the assessment and treatment plan with the patient. The patient was provided an opportunity to ask questions and all were answered. The patient agreed with the plan and demonstrated an understanding of the instructions.   The patient was advised to call back or seek an in-person evaluation if the symptoms worsen or if the condition fails to improve as anticipated.  I provided 23 minutes of non-face-to-face time during this encounter.   Carolann Littler, MD

## 2020-12-22 NOTE — Telephone Encounter (Signed)
Noted  

## 2021-01-01 ENCOUNTER — Ambulatory Visit: Payer: Medicare Other

## 2021-01-05 ENCOUNTER — Ambulatory Visit: Payer: Medicare Other | Attending: Neurology

## 2021-01-05 ENCOUNTER — Other Ambulatory Visit: Payer: Self-pay

## 2021-01-05 DIAGNOSIS — M6281 Muscle weakness (generalized): Secondary | ICD-10-CM | POA: Insufficient documentation

## 2021-01-05 DIAGNOSIS — R2689 Other abnormalities of gait and mobility: Secondary | ICD-10-CM

## 2021-01-05 NOTE — Therapy (Signed)
Outpatient Physical Recertification Note  Patient Name: Emily Cannon MRN: [id number] DOB:19-Nov-1932, 87 y.o., female Today's Date: 01/05/2021   PT End of Session - 01/05/21 1501     Visit Number 17    Number of Visits 28    Date for PT Re-Evaluation 02/09/21    Authorization Type 10 v PN on 11/22/20; Recert on 4/70/96-2/83/66 (2x/week for 12 sessions)    Progress Note Due on Visit 20    PT Start Time 1445    PT Stop Time 1530    PT Time Calculation (min) 45 min    Equipment Utilized During Treatment Gait belt    Activity Tolerance Patient tolerated treatment well    Behavior During Therapy WFL for tasks assessed/performed             PCP: Eulas Post, MD Referring Provier: Marcial Pacas, MD  Referring Diagnosis: R26.9 (ICD-10-CM) - Gait abnormality M35.9,G63 (ICD-10-CM) - 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 ([place] Visit Diagnosis: Muscle weakness (generalized)  Other abnormalities of gait and mobility  Past Medical History:  Diagnosis Date   Allergic rhinitis, cause unspecified    Allergy    Anxiety state, unspecified    Blood transfusion without reported diagnosis    2020   Cataract    removed both eyes    Chronic kidney disease    Disorder of bone and cartilage, unspecified    Esophageal candidiasis (HCC)    Foot drop    Gastric ulcer    History of CMV    History of Helicobacter pylori infection    Hypothyroid    Irritable bowel syndrome    Lumbago    Microscopic polyangiitis (HCC)    Other and unspecified hyperlipidemia    Perforation of tympanic membrane, unspecified    Unspecified essential hypertension    Past Surgical History:  Procedure Laterality Date   ABDOMINAL HYSTERECTOMY     BIOPSY  01/02/2019   Procedure: BIOPSY;  Surgeon: Yetta Flock, MD;  Location: [facility] ENDOSCOPY;  Service: Gastroenterology;;   cataract surgery  5/12 and 12/24/2012   both eyes   COLONOSCOPY     COLONOSCOPY WITH PROPOFOL N/A 01/02/2019    Procedure: COLONOSCOPY WITH PROPOFOL;  Surgeon: Yetta Flock, MD;  Location: [place];  Service: Gastroenterology;  Laterality: N/A;   ESOPHAGOGASTRODUODENOSCOPY (EGD) WITH PROPOFOL N/A 01/02/2019   Procedure: ESOPHAGOGASTRODUODENOSCOPY (EGD) WITH PROPOFOL;  Surgeon: Yetta Flock, MD;  Location: [place];  Service: Gastroenterology;  Laterality: N/A;   NSVD     x1   POLYPECTOMY  01/02/2019   Procedure: POLYPECTOMY;  Surgeon: Yetta Flock, MD;  Location: [facility] ENDOSCOPY;  Service: Gastroenterology;;   POLYPECTOMY     TONSILLECTOMY     UPPER GASTROINTESTINAL ENDOSCOPY     Patient Active Problem List   Diagnosis Date Noted   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 ([place]) 09/25/2020   Peripheral neuropathy 05/08/2020   Weakness 03/28/2020   Gait abnormality 03/28/2020   Paresthesia 03/28/2020   Left foot drop 03/23/2020   Heme positive stool    Benign neoplasm of colon    Acute blood loss anemia 12/30/2018   Symptomatic anemia 12/15/2018   AKI (acute kidney injury) ([place]) 12/15/2018   Hypomagnesemia 12/15/2018   Steroid-induced hyperglycemia 12/15/2018   Microscopic polyangiitis ([place]) 11/13/2018   Positive P-ANCA titer 10/14/2018   Viral URI 05/12/2018   Syncope 06/05/2016   Unspecified venous (peripheral) insufficiency 11/23/2012   Dermatitis 11/10/2012   Hypothyroidism 05/22/2011   TYMPANIC  MEMBRANE PERFORATION, LEFT EAR 08/08/2007   Osteopenia 07/27/2007   Hyperlipidemia 05/11/2007   Anxiety state 05/11/2007   Essential hypertension 05/11/2007   ALLERGIC RHINITIS 05/11/2007   Irritable bowel syndrome 05/11/2007   LOW BACK PAIN 05/11/2007   HYSTERECTOMY, HX OF 05/11/2007    SUBJECTIVE: No changes since last session.  Pain: is patient experiencing pain? No.    OBJECTIVE:  Modified CTSIB: Test 1: 30 sec Test 2: 30 sec Test 3: 30 sec Test 4: 10 sec Total: 100 sec/120 sec  6 MWT (01/05/21): 510 feet with st. Cane Gait speed: 10  meter walk test: 0.46ms with st. Cane (01/05/21)  Today's Treatment:  01/05/21: Gait training: - Walking figure 8 with 2 cones placed 3 feet apart, 8 laps - walking without AD: 4 x 40 feet, cues for arm swings - walking 1 x 220' without AD, cues for arm swings - box taps: 6" box: 20x R and L  12/15/20: Gait training: 1  x220' without AD and CGA for safety; 1 x 440' without AD with stepping over 4 x 2" hurdles x 4" wide Standing on foam with rigid top: stepping up and stepping off required min A, standing for 2' with CGA to min A 2 x 2' Stepping up on 4" box: no HHA, min A for PT required for LOB: 2 x 10 R and L  12/13/20: Gait training: 1 x 1175' st. Cane with CGA and RW incase of fatigue. Pt required 2x standing breaks for <10 seconds Standing up walking around 3x cones in figure 8 position with 180 deg turn and coming back around 3 cones before turning 180 deg and sitting back. Total 5x needed min A x 2 for LOB Gait training: 1 x 420' with st. Cane and SBA  12/08/20: Bed to chair with 180 deg turn: 10x without any AD and CGA, cues to lean fwd more when sitting down. Standing with wide BOS: with bouching 65 cm swiss ball on floor and catching it: 20x Standing daedlift: 25lbs 2 x 5 Squat to 8" box with holding onto mat table: 2 x 5 with bil UE hold on bottom of mat.     Patient Education:  Education details: Pt educated on practicing chair to chair transfer using a cane 4x with her husband present next to her. Person educated: Patient and Spouse Education method: Explanation Education comprehension: verbalized understanding     Home Exercise Program: Access Code: [email or web address]: [email or web address] Date: 10/13/2020 Prepared by: KMarkus Jarvis Exercises Straight Leg Raise - 1 x daily - 7 x weekly - 2 sets - 10 reps Supine Bridge - 1 x daily - 7 x weekly - 2 sets - 10 reps Sidelying Hip Abduction - 1 x daily - 7 x weekly - 2 sets - 10 reps Seated Knee Extension  with Resistance - 1 x daily - 7 x weekly - 2 sets - 10 reps Seated Hamstring Curl with Anchored Resistance - 1 x daily - 7 x weekly - 2 sets - 10 reps    Assessment: Clinical impression:  Pt is demonstrating more confidence with walking with cane. Pt was educated to walk with husband within her home with st. Cane to gain confidence with using LRAD within her home. She should continue to use rollator for community ambulation.  Rehab potential: Good   Clinical decision making: Stable/uncomplicated   Evaluation complexity: Moderate     Goals: Goals reviewed with patient? Yes   SHORT TERM GOALS:   STG  Name Target Date Goal status  1 Pt will be be able to ambulate 1200' with RW to improve walking endurance Comments: Eval = 693' with RW; 1527' with RW (11/22/20) 11/03/20 Goal met   2 Pt will demo compliance with HEP to improve self management of symptoms. Comments: Eval = not issued; compliant 11/03/20 Goal met  3 Pt will get a shower chair and be able to sit and pivot into tub with min A from her husband. Got shower chair on 11/21/20 11/03/20 Goal met    LONG TERM GOALS:    LTG Name Target Date Goal status  1 Pt will be able to stand up from chair without HHA and without bracing knees against chair/bed to improve strength in bil LE Comments: Eval = needs to brace knees against chair to stand up; able to stand up but rquires CGA; able to stand without bracing knees (12/15/20) 12/01/20 Goal met 12/15/20  2 Pt will be able to ambulate >2000' with RW and without rest break to improve walking endurance Comments: Eval = 693' with RW;11/22/20 1527; 12/01/20 Goal met 12/15/20  3 Pt will be able to ambulate at least 800' with LRAD and without rest break to improve functional ambulation Comments: Eval = 693' with RW without rest break, 874' with st. Cane with CGA, 1175' with st. Cane with CGA (12/13/20); 1050' with st cane and CGA (12/15/20) 03/02/2021   Progressing 12/15/20  4 Pt will be able to don/doff her  shoes/braces off at EOB to improve indepence. Comments: Eval =  Husband helps her with getting her shoes and braces and puts them on for her. Pt able to don/doff shoes but due to short legs her legs dangle and pt doesn't feel comfortable. 02/09/21 Progressing continue  12/15/20  5.  Pt will demo 120/120 sec on Modified CTSIB to improve standing balance on uneven surfaces 02/09/21 Revised    PLAN: PT frequency: 2x/week   PT duration: 8weeks   Planned Interventions: Therapeutic exercises, Therapeutic activity, Neuro Muscular re-education, Balance training, Gait training, Patient/Family education, Joint mobilization, Stair training, Orthotic/Fit training, Cryotherapy and Moist heat   Plan for next session: EC on uneven surfaces   Kerrie Pleasure, PT 01/05/2021, [place] [address] [place], [place], [address] Phone: [phone]   Fax:  [phone]  Patient name: Emily Cannon MRN: [id number] DOB: 1933-02-24

## 2021-01-10 ENCOUNTER — Other Ambulatory Visit: Payer: Self-pay

## 2021-01-10 ENCOUNTER — Ambulatory Visit: Payer: Medicare Other

## 2021-01-10 DIAGNOSIS — M6281 Muscle weakness (generalized): Secondary | ICD-10-CM | POA: Diagnosis not present

## 2021-01-10 DIAGNOSIS — R2689 Other abnormalities of gait and mobility: Secondary | ICD-10-CM

## 2021-01-10 NOTE — Therapy (Signed)
Outpatient Physical Recertification Note  Patient Name: Emily Cannon MRN: [id number] DOB:1932/08/29, 87 y.o., female Today's Date: 01/10/2021   PT End of Session - 01/10/21 1457     Visit Number 18    Number of Visits 28    Date for PT Re-Evaluation 02/09/21    Authorization Type 10 v PN on 11/22/20; Recert on 0/10/27-2/53/66 (2x/week for 12 sessions)    Progress Note Due on Visit 20    PT Start Time 1450    PT Stop Time 1530    PT Time Calculation (min) 40 min    Equipment Utilized During Treatment Gait belt    Activity Tolerance Patient tolerated treatment well    Behavior During Therapy WFL for tasks assessed/performed             PCP: Eulas Post, MD Referring Provier: Marcial Pacas, MD  Referring Diagnosis: R26.9 (ICD-10-CM) - Gait abnormality M35.9,G63 (ICD-10-CM) - 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 ([place] Visit Diagnosis: Muscle weakness (generalized)  Other abnormalities of gait and mobility  Past Medical History:  Diagnosis Date   Allergic rhinitis, cause unspecified    Allergy    Anxiety state, unspecified    Blood transfusion without reported diagnosis    2020   Cataract    removed both eyes    Chronic kidney disease    Disorder of bone and cartilage, unspecified    Esophageal candidiasis (HCC)    Foot drop    Gastric ulcer    History of CMV    History of Helicobacter pylori infection    Hypothyroid    Irritable bowel syndrome    Lumbago    Microscopic polyangiitis (HCC)    Other and unspecified hyperlipidemia    Perforation of tympanic membrane, unspecified    Unspecified essential hypertension    Past Surgical History:  Procedure Laterality Date   ABDOMINAL HYSTERECTOMY     BIOPSY  01/02/2019   Procedure: BIOPSY;  Surgeon: Yetta Flock, MD;  Location: [facility] ENDOSCOPY;  Service: Gastroenterology;;   cataract surgery  5/12 and 12/24/2012   both eyes   COLONOSCOPY     COLONOSCOPY WITH PROPOFOL N/A 01/02/2019    Procedure: COLONOSCOPY WITH PROPOFOL;  Surgeon: Yetta Flock, MD;  Location: [place];  Service: Gastroenterology;  Laterality: N/A;   ESOPHAGOGASTRODUODENOSCOPY (EGD) WITH PROPOFOL N/A 01/02/2019   Procedure: ESOPHAGOGASTRODUODENOSCOPY (EGD) WITH PROPOFOL;  Surgeon: Yetta Flock, MD;  Location: [place];  Service: Gastroenterology;  Laterality: N/A;   NSVD     x1   POLYPECTOMY  01/02/2019   Procedure: POLYPECTOMY;  Surgeon: Yetta Flock, MD;  Location: [facility] ENDOSCOPY;  Service: Gastroenterology;;   POLYPECTOMY     TONSILLECTOMY     UPPER GASTROINTESTINAL ENDOSCOPY     Patient Active Problem List   Diagnosis Date Noted   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 ([place]) 09/25/2020   Peripheral neuropathy 05/08/2020   Weakness 03/28/2020   Gait abnormality 03/28/2020   Paresthesia 03/28/2020   Left foot drop 03/23/2020   Heme positive stool    Benign neoplasm of colon    Acute blood loss anemia 12/30/2018   Symptomatic anemia 12/15/2018   AKI (acute kidney injury) ([place]) 12/15/2018   Hypomagnesemia 12/15/2018   Steroid-induced hyperglycemia 12/15/2018   Microscopic polyangiitis ([place]) 11/13/2018   Positive P-ANCA titer 10/14/2018   Viral URI 05/12/2018   Syncope 06/05/2016   Unspecified venous (peripheral) insufficiency 11/23/2012   Dermatitis 11/10/2012   Hypothyroidism 05/22/2011   TYMPANIC  MEMBRANE PERFORATION, LEFT EAR 08/08/2007   Osteopenia 07/27/2007   Hyperlipidemia 05/11/2007   Anxiety state 05/11/2007   Essential hypertension 05/11/2007   ALLERGIC RHINITIS 05/11/2007   Irritable bowel syndrome 05/11/2007   LOW BACK PAIN 05/11/2007   HYSTERECTOMY, HX OF 05/11/2007    SUBJECTIVE: No changes since last session.  Pain: is patient experiencing pain? No.    OBJECTIVE:  Modified CTSIB: Test 1: 30 sec Test 2: 30 sec Test 3: 30 sec Test 4: 10 sec Total: 100 sec/120 sec  6 MWT (01/05/21): 510 feet with st. Cane Gait speed: 10  meter walk test: 0.22m/s with st. Cane (01/05/21)  Today's Treatment:  01/10/21: Gait training: following done with straight cane. - Walking figure 8 with 3 cones placed 3 feet apart, 2 x 10 laps with SBA - Gait training: 2 x 210' with SBA - Going up and down ramp 4x, SBA/CGA with modeate cueing for wider BOS and smaller steps and sequencing for stepping - Going up and down curb step: 3x, SBA/CGA, moderate cueing for sequencing  - ambulated from gym to he car about 250' with st. Cane with SBA, cues required to get closer to push button to open doors to prevent reaching to improve safety awareness.  01/05/21: Gait training: - Walking figure 8 with 2 cones placed 3 feet apart, 8 laps - walking without AD: 4 x 40 feet, cues for arm swings - walking 1 x 220' without AD, cues for arm swings - box taps: 6" box: 20x R and L  12/15/20: Gait training: 1  x220' without AD and CGA for safety; 1 x 440' without AD with stepping over 4 x 2" hurdles x 4" wide Standing on foam with rigid top: stepping up and stepping off required min A, standing for 2' with CGA to min A 2 x 2' Stepping up on 4" box: no HHA, min A for PT required for LOB: 2 x 10 R and L  12/13/20: Gait training: 1 x 1175' st. Cane with CGA and RW incase of fatigue. Pt required 2x standing breaks for <10 seconds Standing up walking around 3x cones in figure 8 position with 180 deg turn and coming back around 3 cones before turning 180 deg and sitting back. Total 5x needed min A x 2 for LOB Gait training: 1 x 420' with st. Cane and SBA  12/08/20: Bed to chair with 180 deg turn: 10x without any AD and CGA, cues to lean fwd more when sitting down. Standing with wide BOS: with bouching 65 cm swiss ball on floor and catching it: 20x Standing daedlift: 25lbs 2 x 5 Squat to 8" box with holding onto mat table: 2 x 5 with bil UE hold on bottom of mat.     Patient Education:  Education details: Pt educated on starting to use cane at home.  Educated husband to stand next to her for safety for first 1-2 weeks until she is more confident. Educated pt to slow down and taking her time when walking with cane. Pt educated to wait 5-10 seconds when standing before she starts stepping to make sure she has good balance.  Person educated: Patient and Spouse Education method: Explanation Education comprehension: verbalized understanding     Home Exercise Program: Access Code: [email or web address] URL: [email or web address] Date: 10/13/2020 Prepared by: Markus Jarvis  Exercises Straight Leg Raise - 1 x daily - 7 x weekly - 2 sets - 10 reps Supine Bridge - 1 x daily - 7 x weekly -  2 sets - 10 reps Sidelying Hip Abduction - 1 x daily - 7 x weekly - 2 sets - 10 reps Seated Knee Extension with Resistance - 1 x daily - 7 x weekly - 2 sets - 10 reps Seated Hamstring Curl with Anchored Resistance - 1 x daily - 7 x weekly - 2 sets - 10 reps    Assessment: Clinical impression:  Pt is more comfortable ambulating on level surface with cane and requires SBA. Pt needs more cueing when negotiating ramps and curbs for safety.  Rehab potential: Good   Clinical decision making: Stable/uncomplicated   Evaluation complexity: Moderate     Goals: Goals reviewed with patient? Yes   SHORT TERM GOALS:   STG Name Target Date Goal status  1 Pt will be be able to ambulate 1200' with RW to improve walking endurance Comments: Eval = 693' with RW; 1527' with RW (11/22/20) 11/03/20 Goal met   2 Pt will demo compliance with HEP to improve self management of symptoms. Comments: Eval = not issued; compliant 11/03/20 Goal met  3 Pt will get a shower chair and be able to sit and pivot into tub with min A from her husband. Got shower chair on 11/21/20 11/03/20 Goal met    LONG TERM GOALS:    LTG Name Target Date Goal status  1 Pt will be able to stand up from chair without HHA and without bracing knees against chair/bed to improve strength in bil LE Comments:  Eval = needs to brace knees against chair to stand up; able to stand up but rquires CGA; able to stand without bracing knees (12/15/20) 12/01/20 Goal met 12/15/20  2 Pt will be able to ambulate >2000' with RW and without rest break to improve walking endurance Comments: Eval = 693' with RW;11/22/20 1527; 12/01/20 Goal met 12/15/20  3 Pt will be able to ambulate at least 800' with LRAD and without rest break to improve functional ambulation Comments: Eval = 693' with RW without rest break, 874' with st. Cane with CGA, 1175' with st. Cane with CGA (12/13/20); 1050' with st cane and CGA (12/15/20) 03/07/2021   Progressing 12/15/20  4 Pt will be able to don/doff her shoes/braces off at EOB to improve indepence. Comments: Eval =  Husband helps her with getting her shoes and braces and puts them on for her. Pt able to don/doff shoes but due to short legs her legs dangle and pt doesn't feel comfortable. 02/09/21 Progressing continue  12/15/20  5.  Pt will demo 120/120 sec on Modified CTSIB to improve standing balance on uneven surfaces 02/09/21 Revised    PLAN: PT frequency: 2x/week   PT duration: 8weeks   Planned Interventions: Therapeutic exercises, Therapeutic activity, Neuro Muscular re-education, Balance training, Gait training, Patient/Family education, Joint mobilization, Stair training, Orthotic/Fit training, Cryotherapy and Moist heat   Plan for next session: EC on uneven surfaces   Kerrie Pleasure, PT 01/10/2021, [place] [address] [place], [place], [address] Phone: [phone]   Fax:  [phone]  Patient name: Emily Cannon MRN: [id number] DOB: 1932/09/10

## 2021-01-12 ENCOUNTER — Ambulatory Visit: Payer: Medicare Other

## 2021-01-12 ENCOUNTER — Other Ambulatory Visit: Payer: Self-pay

## 2021-01-12 DIAGNOSIS — M6281 Muscle weakness (generalized): Secondary | ICD-10-CM | POA: Diagnosis not present

## 2021-01-12 DIAGNOSIS — R2689 Other abnormalities of gait and mobility: Secondary | ICD-10-CM

## 2021-01-12 NOTE — Therapy (Signed)
Outpatient Physical Recertification Note  Patient Name: Emily Cannon MRN: [id number] DOB:28-Nov-1932, 87 y.o., female Today's Date: 01/12/2021   PT End of Session - 01/12/21 1500     Visit Number 19    Number of Visits 28    Date for PT Re-Evaluation 02/09/21    Authorization Type 10 v PN on 11/22/20; Recert on 6/44/03-4/74/25 (2x/week for 12 sessions)    Progress Note Due on Visit 20    PT Start Time 1445    PT Stop Time 1530    PT Time Calculation (min) 45 min    Equipment Utilized During Treatment Gait belt    Activity Tolerance Patient tolerated treatment well    Behavior During Therapy WFL for tasks assessed/performed             PCP: Eulas Post, MD Referring Provier: Marcial Pacas, MD  Referring Diagnosis: R26.9 (ICD-10-CM) - Gait abnormality M35.9,G63 (ICD-10-CM) - 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 ([place] Visit Diagnosis: Muscle weakness (generalized)  Other abnormalities of gait and mobility  Past Medical History:  Diagnosis Date   Allergic rhinitis, cause unspecified    Allergy    Anxiety state, unspecified    Blood transfusion without reported diagnosis    2020   Cataract    removed both eyes    Chronic kidney disease    Disorder of bone and cartilage, unspecified    Esophageal candidiasis (HCC)    Foot drop    Gastric ulcer    History of CMV    History of Helicobacter pylori infection    Hypothyroid    Irritable bowel syndrome    Lumbago    Microscopic polyangiitis (HCC)    Other and unspecified hyperlipidemia    Perforation of tympanic membrane, unspecified    Unspecified essential hypertension    Past Surgical History:  Procedure Laterality Date   ABDOMINAL HYSTERECTOMY     BIOPSY  01/02/2019   Procedure: BIOPSY;  Surgeon: Yetta Flock, MD;  Location: [facility] ENDOSCOPY;  Service: Gastroenterology;;   cataract surgery  5/12 and 12/24/2012   both eyes   COLONOSCOPY     COLONOSCOPY WITH PROPOFOL N/A 01/02/2019    Procedure: COLONOSCOPY WITH PROPOFOL;  Surgeon: Yetta Flock, MD;  Location: [place];  Service: Gastroenterology;  Laterality: N/A;   ESOPHAGOGASTRODUODENOSCOPY (EGD) WITH PROPOFOL N/A 01/02/2019   Procedure: ESOPHAGOGASTRODUODENOSCOPY (EGD) WITH PROPOFOL;  Surgeon: Yetta Flock, MD;  Location: [place];  Service: Gastroenterology;  Laterality: N/A;   NSVD     x1   POLYPECTOMY  01/02/2019   Procedure: POLYPECTOMY;  Surgeon: Yetta Flock, MD;  Location: [facility] ENDOSCOPY;  Service: Gastroenterology;;   POLYPECTOMY     TONSILLECTOMY     UPPER GASTROINTESTINAL ENDOSCOPY     Patient Active Problem List   Diagnosis Date Noted   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 ([place]) 09/25/2020   Peripheral neuropathy 05/08/2020   Weakness 03/28/2020   Gait abnormality 03/28/2020   Paresthesia 03/28/2020   Left foot drop 03/23/2020   Heme positive stool    Benign neoplasm of colon    Acute blood loss anemia 12/30/2018   Symptomatic anemia 12/15/2018   AKI (acute kidney injury) ([place]) 12/15/2018   Hypomagnesemia 12/15/2018   Steroid-induced hyperglycemia 12/15/2018   Microscopic polyangiitis ([place]) 11/13/2018   Positive P-ANCA titer 10/14/2018   Viral URI 05/12/2018   Syncope 06/05/2016   Unspecified venous (peripheral) insufficiency 11/23/2012   Dermatitis 11/10/2012   Hypothyroidism 05/22/2011   TYMPANIC  MEMBRANE PERFORATION, LEFT EAR 08/08/2007   Osteopenia 07/27/2007   Hyperlipidemia 05/11/2007   Anxiety state 05/11/2007   Essential hypertension 05/11/2007   ALLERGIC RHINITIS 05/11/2007   Irritable bowel syndrome 05/11/2007   LOW BACK PAIN 05/11/2007   HYSTERECTOMY, HX OF 05/11/2007    SUBJECTIVE: She has been using cane consistently at home for last couple of days but she feels that she walks next to something for security.  Pain: is patient experiencing pain? No.    OBJECTIVE:  Modified CTSIB: Test 1: 30 sec Test 2: 30 sec Test 3: 30  sec Test 4: 10 sec Total: 100 sec/120 sec  6 MWT (01/05/21): 510 feet with st. Cane Gait speed: 10 meter walk test: 0.36m/s with st. Cane (01/05/21)  Today's Treatment:  Progress note next session 01/12/21 Obstacle course: 3 cones (figure 8 walking); 2 x 6" hurldes, and stepping up on 4" mat (4 feet long): 6x total, CGA/minA Steps: cane in left hand 8 steps, cane in Right hand 8 steps, and contralateral cane, step to pattern, SBA/CGA: Picking up tissues from floor with use of cane: 6x, going up on ramp to pick up one tissue.   01/10/21: Gait training: following done with straight cane. - Walking figure 8 with 3 cones placed 3 feet apart, 2 x 10 laps with SBA - Gait training: 2 x 210' with SBA - Going up and down ramp 4x, SBA/CGA with modeate cueing for wider BOS and smaller steps and sequencing for stepping - Going up and down curb step: 3x, SBA/CGA, moderate cueing for sequencing  - ambulated from gym to he car about 250' with st. Cane with SBA, cues required to get closer to push button to open doors to prevent reaching to improve safety awareness.  01/05/21: Gait training: - Walking figure 8 with 2 cones placed 3 feet apart, 8 laps - walking without AD: 4 x 40 feet, cues for arm swings - walking 1 x 220' without AD, cues for arm swings - box taps: 6" box: 20x R and L  12/15/20: Gait training: 1  x220' without AD and CGA for safety; 1 x 440' without AD with stepping over 4 x 2" hurdles x 4" wide Standing on foam with rigid top: stepping up and stepping off required min A, standing for 2' with CGA to min A 2 x 2' Stepping up on 4" box: no HHA, min A for PT required for LOB: 2 x 10 R and L  12/13/20: Gait training: 1 x 1175' st. Cane with CGA and RW incase of fatigue. Pt required 2x standing breaks for <10 seconds Standing up walking around 3x cones in figure 8 position with 180 deg turn and coming back around 3 cones before turning 180 deg and sitting back. Total 5x needed min A x 2  for LOB Gait training: 1 x 420' with st. Cane and SBA  12/08/20: Bed to chair with 180 deg turn: 10x without any AD and CGA, cues to lean fwd more when sitting down. Standing with wide BOS: with bouching 65 cm swiss ball on floor and catching it: 20x Standing daedlift: 25lbs 2 x 5 Squat to 8" box with holding onto mat table: 2 x 5 with bil UE hold on bottom of mat.     Patient Education:  Education details: Pt educated on starting to use cane at home. Educated husband to stand next to her for safety for first 1-2 weeks until she is more confident. Educated pt to slow  down and taking her time when walking with cane. Pt educated to wait 5-10 seconds when standing before she starts stepping to make sure she has good balance.  Person educated: Patient and Spouse Education method: Explanation Education comprehension: verbalized understanding     Home Exercise Program: Access Code: [email or web address] URL: [email or web address] Date: 10/13/2020 Prepared by: Markus Jarvis  Exercises Straight Leg Raise - 1 x daily - 7 x weekly - 2 sets - 10 reps Supine Bridge - 1 x daily - 7 x weekly - 2 sets - 10 reps Sidelying Hip Abduction - 1 x daily - 7 x weekly - 2 sets - 10 reps Seated Knee Extension with Resistance - 1 x daily - 7 x weekly - 2 sets - 10 reps Seated Hamstring Curl with Anchored Resistance - 1 x daily - 7 x weekly - 2 sets - 10 reps    Assessment: Clinical impression:  Pt required 10% assistance for LOB during obstacle course. Overall pt is feeling more comfortable using the cane with dynamic gait and balance.  Rehab potential: Good   Clinical decision making: Stable/uncomplicated   Evaluation complexity: Moderate     Goals: Goals reviewed with patient? Yes   SHORT TERM GOALS:   STG Name Target Date Goal status  1 Pt will be be able to ambulate 1200' with RW to improve walking endurance Comments: Eval = 693' with RW; 1527' with RW (11/22/20) 11/03/20 Goal met   2 Pt  will demo compliance with HEP to improve self management of symptoms. Comments: Eval = not issued; compliant 11/03/20 Goal met  3 Pt will get a shower chair and be able to sit and pivot into tub with min A from her husband. Got shower chair on 11/21/20 11/03/20 Goal met    LONG TERM GOALS:    LTG Name Target Date Goal status  1 Pt will be able to stand up from chair without HHA and without bracing knees against chair/bed to improve strength in bil LE Comments: Eval = needs to brace knees against chair to stand up; able to stand up but rquires CGA; able to stand without bracing knees (12/15/20) 12/01/20 Goal met 12/15/20  2 Pt will be able to ambulate >2000' with RW and without rest break to improve walking endurance Comments: Eval = 693' with RW;11/22/20 1527; 12/01/20 Goal met 12/15/20  3 Pt will be able to ambulate at least 800' with LRAD and without rest break to improve functional ambulation Comments: Eval = 693' with RW without rest break, 874' with st. Cane with CGA, 1175' with st. Cane with CGA (12/13/20); 1050' with st cane and CGA (12/15/20) 03/09/2021   Progressing 12/15/20  4 Pt will be able to don/doff her shoes/braces off at EOB to improve indepence. Comments: Eval =  Husband helps her with getting her shoes and braces and puts them on for her. Pt able to don/doff shoes but due to short legs her legs dangle and pt doesn't feel comfortable. 02/09/21 Progressing continue  12/15/20  5.  Pt will demo 120/120 sec on Modified CTSIB to improve standing balance on uneven surfaces 02/09/21 Revised    PLAN: PT frequency: 2x/week   PT duration: 8weeks   Planned Interventions: Therapeutic exercises, Therapeutic activity, Neuro Muscular re-education, Balance training, Gait training, Patient/Family education, Joint mobilization, Stair training, Orthotic/Fit training, Cryotherapy and Moist heat   Plan for next session: Sweeping floors, [place] note   Kerrie Pleasure, PT 01/12/2021, [place]  [place] [address] [place], [place], [address] Phone: [phone]   Fax:  [phone]  Patient name: Emily Cannon MRN: [id number] DOB: [birth year]

## 2021-01-15 ENCOUNTER — Ambulatory Visit: Payer: Medicare Other

## 2021-01-15 ENCOUNTER — Other Ambulatory Visit: Payer: Self-pay

## 2021-01-15 DIAGNOSIS — M6281 Muscle weakness (generalized): Secondary | ICD-10-CM

## 2021-01-15 DIAGNOSIS — R2689 Other abnormalities of gait and mobility: Secondary | ICD-10-CM

## 2021-01-15 NOTE — Therapy (Signed)
Outpatient Physical Therapy Note/ Progress Note  Patient Name: Emily Cannon MRN: [id number] DOB:[birth year], 87 y.o., female Today's Date: 01/15/2021   PT End of Session - 01/15/21 1547     Visit Number 20    Number of Visits 28    Date for PT Re-Evaluation 02/09/21    Authorization Type 10 v PN on 11/22/20; Recert on 02/11/95-7/89/38 (2x/week for 12 sessions), 10 v PN on 01/15/21    Progress Note Due on Visit 28    PT Start Time 1400    PT Stop Time 1450    PT Time Calculation (min) 50 min    Equipment Utilized During Treatment Gait belt    Activity Tolerance Patient tolerated treatment well    Behavior During Therapy WFL for tasks assessed/performed              PCP: Eulas Post, MD Referring Provier: Marcial Pacas, MD  Referring Diagnosis: R26.9 (ICD-10-CM) - Gait abnormality M35.9,G63 (ICD-10-CM) - 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 ([place] Visit Diagnosis: Muscle weakness (generalized)  Other abnormalities of gait and mobility  Past Medical History:  Diagnosis Date   Allergic rhinitis, cause unspecified    Allergy    Anxiety state, unspecified    Blood transfusion without reported diagnosis    2020   Cataract    removed both eyes    Chronic kidney disease    Disorder of bone and cartilage, unspecified    Esophageal candidiasis (HCC)    Foot drop    Gastric ulcer    History of CMV    History of Helicobacter pylori infection    Hypothyroid    Irritable bowel syndrome    Lumbago    Microscopic polyangiitis (HCC)    Other and unspecified hyperlipidemia    Perforation of tympanic membrane, unspecified    Unspecified essential hypertension    Past Surgical History:  Procedure Laterality Date   ABDOMINAL HYSTERECTOMY     BIOPSY  01/02/2019   Procedure: BIOPSY;  Surgeon: Yetta Flock, MD;  Location: [facility] ENDOSCOPY;  Service: Gastroenterology;;   cataract surgery  5/12 and 12/24/2012   both eyes   COLONOSCOPY     COLONOSCOPY  WITH PROPOFOL N/A 01/02/2019   Procedure: COLONOSCOPY WITH PROPOFOL;  Surgeon: Yetta Flock, MD;  Location: [place];  Service: Gastroenterology;  Laterality: N/A;   ESOPHAGOGASTRODUODENOSCOPY (EGD) WITH PROPOFOL N/A 01/02/2019   Procedure: ESOPHAGOGASTRODUODENOSCOPY (EGD) WITH PROPOFOL;  Surgeon: Yetta Flock, MD;  Location: [place];  Service: Gastroenterology;  Laterality: N/A;   NSVD     x1   POLYPECTOMY  01/02/2019   Procedure: POLYPECTOMY;  Surgeon: Yetta Flock, MD;  Location: [facility] ENDOSCOPY;  Service: Gastroenterology;;   POLYPECTOMY     TONSILLECTOMY     UPPER GASTROINTESTINAL ENDOSCOPY     Patient Active Problem List   Diagnosis Date Noted   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 ([place]) 09/25/2020   Peripheral neuropathy 05/08/2020   Weakness 03/28/2020   Gait abnormality 03/28/2020   Paresthesia 03/28/2020   Left foot drop 03/23/2020   Heme positive stool    Benign neoplasm of colon    Acute blood loss anemia 12/30/2018   Symptomatic anemia 12/15/2018   AKI (acute kidney injury) ([place]) 12/15/2018   Hypomagnesemia 12/15/2018   Steroid-induced hyperglycemia 12/15/2018   Microscopic polyangiitis ([place]) 11/13/2018   Positive P-ANCA titer 10/14/2018   Viral URI 05/12/2018   Syncope 06/05/2016   Unspecified venous (peripheral) insufficiency 11/23/2012   Dermatitis  11/10/2012   Hypothyroidism 05/22/2011   TYMPANIC MEMBRANE PERFORATION, LEFT EAR 08/08/2007   Osteopenia 07/27/2007   Hyperlipidemia 05/11/2007   Anxiety state 05/11/2007   Essential hypertension 05/11/2007   ALLERGIC RHINITIS 05/11/2007   Irritable bowel syndrome 05/11/2007   LOW BACK PAIN 05/11/2007   HYSTERECTOMY, HX OF 05/11/2007    SUBJECTIVE: She has been using cane at home for past 2 days  Pain: is patient experiencing pain? No.    OBJECTIVE:  Modified CTSIB: Test 1: 30 sec Test 2: 30 sec Test 3: 30 sec Test 4: 10 sec Total: 100 sec/120 sec  6 MWT  (01/05/21): 510 feet with st. Cane Gait speed: 10 meter walk test: 0.70ms with st. Cane (01/05/21)  Today's Treatment:  Progress note next session  01/15/21: Stair training: one cane and one rail: 12 steps, step to pattern, SBA, min cueing for sequencing when ascending stairs required Standing and working on diagonal stepping fwd/sides, bwd/sides: pt standing on stepping discs and PT calls out colors: initially supported with one cane and then towards the end pt using cane intermittently: 5-10 min. Standing deadlift: 25lbs from floor: 10x Standing fwd reach: progressing increased distance: 20x R and L Hand Gait training: cane 1050' with 500 feet in grass with CGA/Min A and on level ground SBA and occaisonal CGA for instability Pt educated to make sure she stops walking before she looks up or turns her head side to side to make sure she doesn't loose her balance with head turns. Pt educated to look ahead 10-15 feet on level ground and look down in front of her when walking on uneven grounds.   01/12/21 Obstacle course: 3 cones (figure 8 walking); 2 x 6" hurldes, and stepping up on 4" mat (4 feet long): 6x total, CGA/minA Steps: cane in left hand 8 steps, cane in Right hand 8 steps, and contralateral cane, step to pattern, SBA/CGA: Picking up tissues from floor with use of cane: 6x, going up on ramp to pick up one tissue.   01/10/21: Gait training: following done with straight cane. - Walking figure 8 with 3 cones placed 3 feet apart, 2 x 10 laps with SBA - Gait training: 2 x 210' with SBA - Going up and down ramp 4x, SBA/CGA with modeate cueing for wider BOS and smaller steps and sequencing for stepping - Going up and down curb step: 3x, SBA/CGA, moderate cueing for sequencing  - ambulated from gym to he car about 250' with st. Cane with SBA, cues required to get closer to push button to open doors to prevent reaching to improve safety awareness.  01/05/21: Gait training: - Walking figure 8  with 2 cones placed 3 feet apart, 8 laps - walking without AD: 4 x 40 feet, cues for arm swings - walking 1 x 220' without AD, cues for arm swings - box taps: 6" box: 20x R and L  12/15/20: Gait training: 1  x220' without AD and CGA for safety; 1 x 440' without AD with stepping over 4 x 2" hurdles x 4" wide Standing on foam with rigid top: stepping up and stepping off required min A, standing for 2' with CGA to min A 2 x 2' Stepping up on 4" box: no HHA, min A for PT required for LOB: 2 x 10 R and L  12/13/20: Gait training: 1 x 1175' st. Cane with CGA and RW incase of fatigue. Pt required 2x standing breaks for <10 seconds Standing up walking around 3x cones  in figure 8 position with 180 deg turn and coming back around 3 cones before turning 180 deg and sitting back. Total 5x needed min A x 2 for LOB Gait training: 1 x 420' with st. Cane and SBA  12/08/20: Bed to chair with 180 deg turn: 10x without any AD and CGA, cues to lean fwd more when sitting down. Standing with wide BOS: with bouching 65 cm swiss ball on floor and catching it: 20x Standing daedlift: 25lbs 2 x 5 Squat to 8" box with holding onto mat table: 2 x 5 with bil UE hold on bottom of mat.     Patient Education:  Education details: Pt educated on starting to use cane at home. Educated husband to stand next to her for safety for first 1-2 weeks until she is more confident. Educated pt to slow down and taking her time when walking with cane. Pt educated to wait 5-10 seconds when standing before she starts stepping to make sure she has good balance.  Person educated: Patient and Spouse Education method: Explanation Education comprehension: verbalized understanding     Home Exercise Program: Access Code: [email or web address] URL: [email or web address] Date: 10/13/2020 Prepared by: Markus Jarvis  Exercises Straight Leg Raise - 1 x daily - 7 x weekly - 2 sets - 10 reps Supine Bridge - 1 x daily - 7 x weekly - 2 sets -  10 reps Sidelying Hip Abduction - 1 x daily - 7 x weekly - 2 sets - 10 reps Seated Knee Extension with Resistance - 1 x daily - 7 x weekly - 2 sets - 10 reps Seated Hamstring Curl with Anchored Resistance - 1 x daily - 7 x weekly - 2 sets - 10 reps    Assessment: Clinical impression:  Pt required min A with walking on grass with cane due to L ankle rolling in and posterior during stance phase. Required >95% of SBA on level ground outdoors and 5% CGA due to occaisonal instability. Patient is progressing well.  Rehab potential: Good   Clinical decision making: Stable/uncomplicated   Evaluation complexity: Moderate     Goals: Goals reviewed with patient? Yes   SHORT TERM GOALS:   STG Name Target Date Goal status  1 Pt will be be able to ambulate 1200' with RW to improve walking endurance Comments: Eval = 693' with RW; 1527' with RW (11/22/20) 11/03/20 Goal met   2 Pt will demo compliance with HEP to improve self management of symptoms. Comments: Eval = not issued; compliant 11/03/20 Goal met  3 Pt will get a shower chair and be able to sit and pivot into tub with min A from her husband. Got shower chair on 11/21/20 11/03/20 Goal met    LONG TERM GOALS:    LTG Name Target Date Goal status  1 Pt will be able to stand up from chair without HHA and without bracing knees against chair/bed to improve strength in bil LE Comments: Eval = needs to brace knees against chair to stand up; able to stand up but rquires CGA; able to stand without bracing knees (12/15/20) 12/01/20 Goal met 12/15/20  2 Pt will be able to ambulate >2000' with RW and without rest break to improve walking endurance Comments: Eval = 693' with RW;11/22/20 1527; 12/01/20 Goal met 12/15/20  3 Pt will be able to ambulate at least 800' with LRAD and without rest break to improve functional ambulation Comments: Eval = 693' with RW without rest break, 874' with st. Cane with  CGA, 1175' with st. Cane with CGA (12/13/20); 1050' with st cane and  CGA (12/15/20); 1050' with st. Cane and SBA/CGA 03/12/2021   Progressing 01/15/21  4 Pt will be able to don/doff her shoes/braces off at EOB to improve indepence. Comments: Eval =  Husband helps her with getting her shoes and braces and puts them on for her. Pt able to don/doff shoes but due to short legs her legs dangle and pt doesn't feel comfortable. 02/09/21 Progressing continue  12/15/20  5.  Pt will demo 120/120 sec on Modified CTSIB to improve standing balance on uneven surfaces 02/09/21 Progressing, continue 01/15/21    PLAN: PT frequency: 2x/week   PT duration: 8weeks   Planned Interventions: Therapeutic exercises, Therapeutic activity, Neuro Muscular re-education, Balance training, Gait training, Patient/Family education, Joint mobilization, Stair training, Orthotic/Fit training, Cryotherapy and Moist heat   Plan for next session: Sweeping floors, [place] note   Kerrie Pleasure, PT 01/15/2021, [place] [address] [place], [place], [address] Phone: [phone]   Fax:  [phone]  Patient name: Emily Cannon MRN: [id number] DOB: 1932-11-08

## 2021-01-17 ENCOUNTER — Other Ambulatory Visit: Payer: Self-pay

## 2021-01-17 ENCOUNTER — Telehealth (INDEPENDENT_AMBULATORY_CARE_PROVIDER_SITE_OTHER): Payer: Medicare Other | Admitting: Family Medicine

## 2021-01-17 DIAGNOSIS — M317 Microscopic polyangiitis: Secondary | ICD-10-CM

## 2021-01-17 DIAGNOSIS — R17 Unspecified jaundice: Secondary | ICD-10-CM | POA: Diagnosis not present

## 2021-01-17 NOTE — Progress Notes (Signed)
Patient ID: Emily Cannon, female   DOB: Jun 29, 1933, 87 y.o.   MRN: [id number]  This visit type was conducted due to national recommendations for restrictions regarding the COVID-19 pandemic in an effort to limit this patient's exposure and mitigate transmission in our community.   Virtual Visit via Telephone Note  I connected with Bethanee Redondo on 01/17/21 at  5:45 PM EDT by telephone and verified that I am speaking with the correct person using two identifiers.   I discussed the limitations, risks, security and privacy concerns of performing an evaluation and management service by telephone and the availability of in person appointments. I also discussed with the patient that there may be a patient responsible charge related to this service. The patient expressed understanding and agreed to proceed.  Location patient: home Location provider: work or home office Participants present for the call: patient, provider Patient did not have a visit in the prior 7 days to address this/these issue(s).   History of Present Illness:  Ms. Abernathy has history of hypertension, microscopic polyangiitis, irritable bowel syndrome, hypothyroidism, peripheral neuropathy, chronic kidney disease.  I received a phone call earlier today from Dr. Harrie Jeans patient's nephrologist.  She had received a phone call from patient's ophthalmologist Dr. Lucita Ferrara with question of scleral icterus.  Patient and her husband state that this is only involving the left eye.  He had noticed little bit of yellowing.  They are not aware of any obvious jaundice involving the trunk or extremities.  She has not had any recent nausea, vomiting, fever, or any abdominal pain.  She is taking Tavneos for her microscopic polyangiitis but has not had any hepatic problems related to that thus far.  Her last liver chemistries that I could find on record were from [place] on 10-25-2020.  Her bilirubin and SGOT and SGPT were normal at that  time.  She states she generally feels well with no specific complaints today.  Past Medical History:  Diagnosis Date   Allergic rhinitis, cause unspecified    Allergy    Anxiety state, unspecified    Blood transfusion without reported diagnosis    2020   Cataract    removed both eyes    Chronic kidney disease    Disorder of bone and cartilage, unspecified    Esophageal candidiasis (HCC)    Foot drop    Gastric ulcer    History of CMV    History of Helicobacter pylori infection    Hypothyroid    Irritable bowel syndrome    Lumbago    Microscopic polyangiitis (HCC)    Other and unspecified hyperlipidemia    Perforation of tympanic membrane, unspecified    Unspecified essential hypertension    Past Surgical History:  Procedure Laterality Date   ABDOMINAL HYSTERECTOMY     BIOPSY  01/02/2019   Procedure: BIOPSY;  Surgeon: Yetta Flock, MD;  Location: [facility];  Service: Gastroenterology;;   cataract surgery  5/12 and 12/24/2012   both eyes   COLONOSCOPY     COLONOSCOPY WITH PROPOFOL N/A 01/02/2019   Procedure: COLONOSCOPY WITH PROPOFOL;  Surgeon: Yetta Flock, MD;  Location: [facility];  Service: Gastroenterology;  Laterality: N/A;   ESOPHAGOGASTRODUODENOSCOPY (EGD) WITH PROPOFOL N/A 01/02/2019   Procedure: ESOPHAGOGASTRODUODENOSCOPY (EGD) WITH PROPOFOL;  Surgeon: Yetta Flock, MD;  Location: [facility];  Service: Gastroenterology;  Laterality: N/A;   NSVD     x1   POLYPECTOMY  01/02/2019   Procedure: POLYPECTOMY;  Surgeon:  Cellar  P, MD;  Location: [facility] [place] [place] [facility];  Service: Gastroenterology;;   POLYPECTOMY     TONSILLECTOMY     UPPER GASTROINTESTINAL ENDOSCOPY      reports that she has never smoked. She has never used smokeless tobacco. She reports that she does not drink alcohol and does not use drugs. family history includes COPD in an other family member; Lung cancer (age of onset: 76) in her mother; Lung disease (age of onset: 70) in her  father. Allergies  Allergen Reactions   Alendronate Sodium Shortness Of Breath   Amoxicillin Diarrhea    Severe diarrhea   Codeine Nausea Only   Valsartan Swelling    Gums/throat swell      Observations/Objective: Patient sounds cheerful and well on the phone. I do not appreciate any SOB. Speech and thought processing are grossly intact. Patient reported vitals:  Assessment and Plan:  Question of unilateral scleral icterus involving the left eye.  Concern raised per ophthalmology assessment earlier today.  Patient with multiple chronic medical problems as above including microscopic polyangiitis -on Tavneos.  We explained that unilateral scleral icterus would be very unusual, though not impossible.  She does not have any red flag symptoms such as fever, nausea, vomiting, or abdominal pain.  -Start with basic labs with CBC and comprehensive metabolic panel -If labs confirm elevated liver transaminases and bilirubin will discuss with her rheumatologist and also have her hold the atorvastatin for now -Follow-up immediately for any fever, vomiting, abdominal pain  Follow Up Instructions  -Patient has scheduled appointment for tomorrow at 4:20 for lab work to include CBC and comprehensive metabolic panel   [id number] [id number] 11-20 [id number] 21-30 I did not refer this patient for an OV in the next 24 hours for this/these issue(s).  I discussed the assessment and treatment plan with the patient. The patient was provided an opportunity to ask questions and all were answered. The patient agreed with the plan and demonstrated an understanding of the instructions.   The patient was advised to call back or seek an in-person evaluation if the symptoms worsen or if the condition fails to improve as anticipated.  I provided 23 minutes of non-face-to-face time during this encounter.   Carolann Littler, MD

## 2021-01-18 ENCOUNTER — Other Ambulatory Visit: Payer: Self-pay

## 2021-01-18 ENCOUNTER — Other Ambulatory Visit (INDEPENDENT_AMBULATORY_CARE_PROVIDER_SITE_OTHER): Payer: Medicare Other

## 2021-01-18 ENCOUNTER — Telehealth: Payer: Self-pay | Admitting: Family Medicine

## 2021-01-18 DIAGNOSIS — R300 Dysuria: Secondary | ICD-10-CM

## 2021-01-18 DIAGNOSIS — M317 Microscopic polyangiitis: Secondary | ICD-10-CM | POA: Diagnosis not present

## 2021-01-18 DIAGNOSIS — R17 Unspecified jaundice: Secondary | ICD-10-CM | POA: Diagnosis not present

## 2021-01-18 NOTE — Telephone Encounter (Signed)
Emily Cannon called the after hours line and wants to know if a UA can be added to the patients labs because she is having burning pain while urinating.

## 2021-01-18 NOTE — Addendum Note (Signed)
Addended by: Amanda Cockayne on: 01/18/2021 04:29 PM   Modules accepted: Orders

## 2021-01-18 NOTE — Telephone Encounter (Signed)
Spoke with the patients spouse. He is aware the UA has been ordered to the labs. Nothing further needed.

## 2021-01-19 ENCOUNTER — Ambulatory Visit: Payer: Medicare Other

## 2021-01-19 DIAGNOSIS — R2689 Other abnormalities of gait and mobility: Secondary | ICD-10-CM

## 2021-01-19 DIAGNOSIS — M6281 Muscle weakness (generalized): Secondary | ICD-10-CM | POA: Diagnosis not present

## 2021-01-19 LAB — URINALYSIS, ROUTINE W REFLEX MICROSCOPIC
Bilirubin Urine: NEGATIVE
Hgb urine dipstick: NEGATIVE
Ketones, ur: NEGATIVE
Nitrite: NEGATIVE
Specific Gravity, Urine: 1.025 (ref 1.000–1.030)
Urine Glucose: NEGATIVE
Urobilinogen, UA: 0.2 (ref 0.0–1.0)
pH: 5.5 (ref 5.0–8.0)

## 2021-01-19 LAB — COMPREHENSIVE METABOLIC PANEL
ALT: 20 U/L (ref 0–35)
AST: 20 U/L (ref 0–37)
Albumin: 4.3 g/dL (ref 3.5–5.2)
Alkaline Phosphatase: 57 U/L (ref 39–117)
BUN: 26 mg/dL — ABNORMAL HIGH (ref 6–23)
CO2: 23 mEq/L (ref 19–32)
Calcium: 9.1 mg/dL (ref 8.4–10.5)
Chloride: 106 mEq/L (ref 96–112)
Creatinine, Ser: 1.45 mg/dL — ABNORMAL HIGH (ref 0.40–1.20)
GFR: 32.86 mL/min — ABNORMAL LOW (ref >60.00)
Glucose, Bld: 172 mg/dL — ABNORMAL HIGH (ref 70–99)
Potassium: 4.4 mEq/L (ref 3.5–5.1)
Sodium: 139 mEq/L (ref 135–145)
Total Bilirubin: 0.3 mg/dL (ref 0.2–1.2)
Total Protein: 6.1 g/dL (ref 6.0–8.3)

## 2021-01-19 LAB — CBC WITH DIFFERENTIAL/PLATELET
Basophils Absolute: 0.1 10*3/uL (ref 0.0–0.1)
Basophils Relative: 0.7 % (ref 0.0–3.0)
Eosinophils Absolute: 0.1 10*3/uL (ref 0.0–0.7)
Eosinophils Relative: 0.9 % (ref 0.0–5.0)
HCT: 38.6 % (ref 36.0–46.0)
Hemoglobin: 12.9 g/dL (ref 12.0–15.0)
Lymphocytes Relative: 31.7 % (ref 12.0–46.0)
Lymphs Abs: 3.3 10*3/uL (ref 0.7–4.0)
MCHC: 33.5 g/dL (ref 30.0–36.0)
MCV: 98.2 fl (ref 78.0–100.0)
Monocytes Absolute: 0.9 10*3/uL (ref 0.1–1.0)
Monocytes Relative: 8.3 % (ref 3.0–12.0)
Neutro Abs: 6 10*3/uL (ref 1.4–7.7)
Neutrophils Relative %: 58.4 % (ref 43.0–77.0)
Platelets: 255 10*3/uL (ref 150.0–400.0)
RBC: 3.93 Mil/uL (ref 3.87–5.11)
RDW: 14.4 % (ref 11.5–15.5)
WBC: 10.3 10*3/uL (ref 4.0–10.5)

## 2021-01-19 NOTE — Therapy (Signed)
Outpatient Physical Therapy Note/ Progress Note  Patient Name: Emily Cannon MRN: [id number] DOB:05-Feb-1933, 87 y.o., female Today's Date: 01/19/2021   PT End of Session - 01/19/21 1448     Visit Number 21    Number of Visits 28    Date for PT Re-Evaluation 02/09/21    Authorization Type 10 v PN on 11/22/20; Recert on 04/11/77-2/95/62 (2x/week for 12 sessions), 10 v PN on 01/15/21    Progress Note Due on Visit 28    PT Start Time 1445    PT Stop Time 1530    PT Time Calculation (min) 45 min    Equipment Utilized During Treatment Gait belt    Activity Tolerance Patient tolerated treatment well    Behavior During Therapy WFL for tasks assessed/performed              PCP: Eulas Post, MD Referring Provier: Marcial Pacas, MD  Referring Diagnosis: R26.9 (ICD-10-CM) - Gait abnormality M35.9,G63 (ICD-10-CM) - 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 ([place] Visit Diagnosis: Muscle weakness (generalized)  Other abnormalities of gait and mobility  Past Medical History:  Diagnosis Date   Allergic rhinitis, cause unspecified    Allergy    Anxiety state, unspecified    Blood transfusion without reported diagnosis    2020   Cataract    removed both eyes    Chronic kidney disease    Disorder of bone and cartilage, unspecified    Esophageal candidiasis (HCC)    Foot drop    Gastric ulcer    History of CMV    History of Helicobacter pylori infection    Hypothyroid    Irritable bowel syndrome    Lumbago    Microscopic polyangiitis (HCC)    Other and unspecified hyperlipidemia    Perforation of tympanic membrane, unspecified    Unspecified essential hypertension    Past Surgical History:  Procedure Laterality Date   ABDOMINAL HYSTERECTOMY     BIOPSY  01/02/2019   Procedure: BIOPSY;  Surgeon: Yetta Flock, MD;  Location: [facility] ENDOSCOPY;  Service: Gastroenterology;;   cataract surgery  5/12 and 12/24/2012   both eyes   COLONOSCOPY     COLONOSCOPY  WITH PROPOFOL N/A 01/02/2019   Procedure: COLONOSCOPY WITH PROPOFOL;  Surgeon: Yetta Flock, MD;  Location: [facility];  Service: Gastroenterology;  Laterality: N/A;   ESOPHAGOGASTRODUODENOSCOPY (EGD) WITH PROPOFOL N/A 01/02/2019   Procedure: ESOPHAGOGASTRODUODENOSCOPY (EGD) WITH PROPOFOL;  Surgeon: Yetta Flock, MD;  Location: [place] [facility];  Service: Gastroenterology;  Laterality: N/A;   NSVD     x1   POLYPECTOMY  01/02/2019   Procedure: POLYPECTOMY;  Surgeon: Yetta Flock, MD;  Location: [facility] ENDOSCOPY;  Service: Gastroenterology;;   POLYPECTOMY     TONSILLECTOMY     UPPER GASTROINTESTINAL ENDOSCOPY     Patient Active Problem List   Diagnosis Date Noted   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 ([place]) 09/25/2020   Peripheral neuropathy 05/08/2020   Weakness 03/28/2020   Gait abnormality 03/28/2020   Paresthesia 03/28/2020   Left foot drop 03/23/2020   Heme positive stool    Benign neoplasm of colon    Acute blood loss anemia 12/30/2018   Symptomatic anemia 12/15/2018   AKI (acute kidney injury) ([place]) 12/15/2018   Hypomagnesemia 12/15/2018   Steroid-induced hyperglycemia 12/15/2018   Microscopic polyangiitis ([place]) 11/13/2018   Positive P-ANCA titer 10/14/2018   Viral URI 05/12/2018   Syncope 06/05/2016   Unspecified venous (peripheral) insufficiency 11/23/2012   Dermatitis  11/10/2012   Hypothyroidism 05/22/2011   TYMPANIC MEMBRANE PERFORATION, LEFT EAR 08/08/2007   Osteopenia 07/27/2007   Hyperlipidemia 05/11/2007   Anxiety state 05/11/2007   Essential hypertension 05/11/2007   ALLERGIC RHINITIS 05/11/2007   Irritable bowel syndrome 05/11/2007   LOW BACK PAIN 05/11/2007   HYSTERECTOMY, HX OF 05/11/2007    SUBJECTIVE: She has been using cane at home for past 2 days  Pain: is patient experiencing pain? No.    OBJECTIVE:  Modified CTSIB: Test 1: 30 sec Test 2: 30 sec Test 3: 30 sec Test 4: 10 sec Total: 100 sec/120 sec  6 MWT  (01/05/21): 510 feet with st. Cane Gait speed: 10 meter walk test: 0.38ms with st. Cane (01/05/21)  Today's Treatment:   01/19/21: Gait training: Figure 8 walking around 3 cones ON GRASS, 2 laps, 4 laps, 6 laps with straight cane, CGA to min A required due to occasional LOB Walking over 6" hurdle ON GRASS: 6 x with st. Cane and CGA to min A Pt's current cane's twist lock is broken. Recommended that the get a new cane with straight handle and potentially with tripod base for more stability                   01/15/21: Stair training: one cane and one rail: 12 steps, step to pattern, SBA, min cueing for sequencing when ascending stairs required Standing and working on diagonal stepping fwd/sides, bwd/sides: pt standing on stepping discs and PT calls out colors: initially supported with one cane and then towards the end pt using cane intermittently: 5-10 min. Standing deadlift: 25lbs from floor: 10x Standing fwd reach: progressing increased distance: 20x R and L Hand Gait training: cane 1050' with 500 feet in grass with CGA/Min A and on level ground SBA and occaisonal CGA for instability Pt educated to make sure she stops walking before she looks up or turns her head side to side to make sure she doesn't loose her balance with head turns. Pt educated to look ahead 10-15 feet on level ground and look down in front of her when walking on uneven grounds.   01/12/21 Obstacle course: 3 cones (figure 8 walking); 2 x 6" hurldes, and stepping up on 4" mat (4 feet long): 6x total, CGA/minA Steps: cane in left hand 8 steps, cane in Right hand 8 steps, and contralateral cane, step to pattern, SBA/CGA: Picking up tissues from floor with use of cane: 6x, going up on ramp to pick up one tissue.   01/10/21: Gait training: following done with straight cane. - Walking figure 8 with 3 cones placed 3 feet apart, 2 x 10 laps with SBA - Gait training: 2 x 210' with SBA - Going up and down ramp 4x, SBA/CGA with  modeate cueing for wider BOS and smaller steps and sequencing for stepping - Going up and down curb step: 3x, SBA/CGA, moderate cueing for sequencing  - ambulated from gym to he car about 250' with st. Cane with SBA, cues required to get closer to push button to open doors to prevent reaching to improve safety awareness.  01/05/21: Gait training: - Walking figure 8 with 2 cones placed 3 feet apart, 8 laps - walking without AD: 4 x 40 feet, cues for arm swings - walking 1 x 220' without AD, cues for arm swings - box taps: 6" box: 20x R and L  12/15/20: Gait training: 1  x220' without AD and CGA for safety; 1 x 440' without AD with  stepping over 4 x 2" hurdles x 4" wide Standing on foam with rigid top: stepping up and stepping off required min A, standing for 2' with CGA to min A 2 x 2' Stepping up on 4" box: no HHA, min A for PT required for LOB: 2 x 10 R and L  12/13/20: Gait training: 1 x 1175' st. Cane with CGA and RW incase of fatigue. Pt required 2x standing breaks for <10 seconds Standing up walking around 3x cones in figure 8 position with 180 deg turn and coming back around 3 cones before turning 180 deg and sitting back. Total 5x needed min A x 2 for LOB Gait training: 1 x 420' with st. Cane and SBA  12/08/20: Bed to chair with 180 deg turn: 10x without any AD and CGA, cues to lean fwd more when sitting down. Standing with wide BOS: with bouching 65 cm swiss ball on floor and catching it: 20x Standing daedlift: 25lbs 2 x 5 Squat to 8" box with holding onto mat table: 2 x 5 with bil UE hold on bottom of mat.     Patient Education:  Education details: Pt educated on starting to use cane at home. Educated husband to stand next to her for safety for first 1-2 weeks until she is more confident. Educated pt to slow down and taking her time when walking with cane. Pt educated to wait 5-10 seconds when standing before she starts stepping to make sure she has good balance.  Person  educated: Patient and Spouse Education method: Explanation Education comprehension: verbalized understanding     Home Exercise Program: Access Code: [email or web address] URL: [email or web address] Date: 10/13/2020 Prepared by: Markus Jarvis  Exercises Straight Leg Raise - 1 x daily - 7 x weekly - 2 sets - 10 reps Supine Bridge - 1 x daily - 7 x weekly - 2 sets - 10 reps Sidelying Hip Abduction - 1 x daily - 7 x weekly - 2 sets - 10 reps Seated Knee Extension with Resistance - 1 x daily - 7 x weekly - 2 sets - 10 reps Seated Hamstring Curl with Anchored Resistance - 1 x daily - 7 x weekly - 2 sets - 10 reps    Assessment: Clinical impression:  Pt tolerated session well. Pt was little more comfortable and required lesser assistance from therapist with dynamic gait training on grass today.  Rehab potential: Good   Clinical decision making: Stable/uncomplicated   Evaluation complexity: Moderate     Goals: Goals reviewed with patient? Yes   SHORT TERM GOALS:   STG Name Target Date Goal status  1 Pt will be be able to ambulate 1200' with RW to improve walking endurance Comments: Eval = 693' with RW; 1527' with RW (11/22/20) 11/03/20 Goal met   2 Pt will demo compliance with HEP to improve self management of symptoms. Comments: Eval = not issued; compliant 11/03/20 Goal met  3 Pt will get a shower chair and be able to sit and pivot into tub with min A from her husband. Got shower chair on 11/21/20 11/03/20 Goal met    LONG TERM GOALS:    LTG Name Target Date Goal status  1 Pt will be able to stand up from chair without HHA and without bracing knees against chair/bed to improve strength in bil LE Comments: Eval = needs to brace knees against chair to stand up; able to stand up but rquires CGA; able to stand without bracing knees (12/15/20) 12/01/20 Goal met  12/15/20  2 Pt will be able to ambulate >2000' with RW and without rest break to improve walking endurance Comments: Eval = 693'  with RW;11/22/20 1527; 12/01/20 Goal met 12/15/20  3 Pt will be able to ambulate at least 800' with LRAD and without rest break to improve functional ambulation Comments: Eval = 693' with RW without rest break, 874' with st. Cane with CGA, 1175' with st. Cane with CGA (12/13/20); 1050' with st cane and CGA (12/15/20); 1050' with st. Cane and SBA/CGA 03/16/2021   Progressing 01/15/21  4 Pt will be able to don/doff her shoes/braces off at EOB to improve indepence. Comments: Eval =  Husband helps her with getting her shoes and braces and puts them on for her. Pt able to don/doff shoes but due to short legs her legs dangle and pt doesn't feel comfortable. 02/09/21 Progressing continue  12/15/20  5.  Pt will demo 120/120 sec on Modified CTSIB to improve standing balance on uneven surfaces 02/09/21 Progressing, continue 01/15/21    PLAN: PT frequency: 2x/week   PT duration: 8weeks   Planned Interventions: Therapeutic exercises, Therapeutic activity, Neuro Muscular re-education, Balance training, Gait training, Patient/Family education, Joint mobilization, Stair training, Orthotic/Fit training, Cryotherapy and Moist heat   Plan for next session: Sweeping floors, [place] note   Kerrie Pleasure, PT 01/19/2021, [facility] [address] [place], [place], [address] Phone: [phone]   Fax:  [phone]  Patient name: RASHANA ANDREW MRN: [id number] DOB: [birth year]

## 2021-01-20 ENCOUNTER — Other Ambulatory Visit: Payer: Self-pay | Admitting: Family Medicine

## 2021-01-24 ENCOUNTER — Ambulatory Visit: Payer: Medicare Other

## 2021-01-24 ENCOUNTER — Other Ambulatory Visit: Payer: Self-pay

## 2021-01-24 DIAGNOSIS — M6281 Muscle weakness (generalized): Secondary | ICD-10-CM | POA: Diagnosis not present

## 2021-01-24 DIAGNOSIS — R2689 Other abnormalities of gait and mobility: Secondary | ICD-10-CM

## 2021-01-24 NOTE — Therapy (Signed)
Outpatient Physical Therapy Note/ Progress Note  Patient Name: Emily Cannon MRN: [id number] DOB:09-03-1932, 87 y.o., female Today's Date: 01/24/2021   PT End of Session - 01/24/21 1525     Visit Number 22    Number of Visits 28    Date for PT Re-Evaluation 02/09/21    Authorization Type 10 v PN on 11/22/20; Recert on 6/64/40-3/47/42 (2x/week for 12 sessions), 10 v PN on 01/15/21    Progress Note Due on Visit 28    PT Start Time 1450    PT Stop Time 1530    PT Time Calculation (min) 40 min    Equipment Utilized During Treatment Gait belt    Activity Tolerance Patient tolerated treatment well    Behavior During Therapy WFL for tasks assessed/performed              PCP: Emily Post, MD Referring Provier: Emily Pacas, MD  Referring Diagnosis: R26.9 (ICD-10-CM) - Gait abnormality M35.9,G63 (ICD-10-CM) - 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 ([place] Visit Diagnosis: Other abnormalities of gait and mobility  Muscle weakness (generalized)  Past Medical History:  Diagnosis Date   Allergic rhinitis, cause unspecified    Allergy    Anxiety state, unspecified    Blood transfusion without reported diagnosis    2020   Cataract    removed both eyes    Chronic kidney disease    Disorder of bone and cartilage, unspecified    Esophageal candidiasis (HCC)    Foot drop    Gastric ulcer    History of CMV    History of Helicobacter pylori infection    Hypothyroid    Irritable bowel syndrome    Lumbago    Microscopic polyangiitis (HCC)    Other and unspecified hyperlipidemia    Perforation of tympanic membrane, unspecified    Unspecified essential hypertension    Past Surgical History:  Procedure Laterality Date   ABDOMINAL HYSTERECTOMY     BIOPSY  01/02/2019   Procedure: BIOPSY;  Surgeon: Emily Flock, MD;  Location: [place];  Service: Gastroenterology;;   cataract surgery  5/12 and 12/24/2012   both eyes   COLONOSCOPY     COLONOSCOPY  WITH PROPOFOL N/A 01/02/2019   Procedure: COLONOSCOPY WITH PROPOFOL;  Surgeon: Emily Flock, MD;  Location: [place];  Service: Gastroenterology;  Laterality: N/A;   ESOPHAGOGASTRODUODENOSCOPY (EGD) WITH PROPOFOL N/A 01/02/2019   Procedure: ESOPHAGOGASTRODUODENOSCOPY (EGD) WITH PROPOFOL;  Surgeon: Emily Flock, MD;  Location: [place];  Service: Gastroenterology;  Laterality: N/A;   NSVD     x1   POLYPECTOMY  01/02/2019   Procedure: POLYPECTOMY;  Surgeon: Emily Flock, MD;  [facility] ENDOSCOPY;  Service: Gastroenterology;;   POLYPECTOMY     TONSILLECTOMY     UPPER GASTROINTESTINAL ENDOSCOPY     Patient Active Problem List   Diagnosis Date Noted   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 ([place]) 09/25/2020   Peripheral neuropathy 05/08/2020   Weakness 03/28/2020   Gait abnormality 03/28/2020   Paresthesia 03/28/2020   Left foot drop 03/23/2020   Heme positive stool    Benign neoplasm of colon    Acute blood loss anemia 12/30/2018   Symptomatic anemia 12/15/2018   AKI (acute kidney injury) ([place]) 12/15/2018   Hypomagnesemia 12/15/2018   Steroid-induced hyperglycemia 12/15/2018   Microscopic polyangiitis ([place]) 11/13/2018   Positive P-ANCA titer 10/14/2018   Viral URI 05/12/2018   Syncope 06/05/2016   Unspecified venous (peripheral) insufficiency 11/23/2012   Dermatitis  11/10/2012   Hypothyroidism 05/22/2011   TYMPANIC MEMBRANE PERFORATION, LEFT EAR 08/08/2007   Osteopenia 07/27/2007   Hyperlipidemia 05/11/2007   Anxiety state 05/11/2007   Essential hypertension 05/11/2007   ALLERGIC RHINITIS 05/11/2007   Irritable bowel syndrome 05/11/2007   LOW BACK PAIN 05/11/2007   HYSTERECTOMY, HX OF 05/11/2007    SUBJECTIVE: She has been using cane at home for past 2 days  Pain: is patient experiencing pain? No.    OBJECTIVE:  Modified CTSIB: Test 1: 30 sec Test 2: 30 sec Test 3: 30 sec Test 4: 10 sec Total: 100 sec/120 sec  6 MWT  (01/05/21): 510 feet with st. Cane Gait speed: 10 meter walk test: 0.55ms with st. Cane (01/05/21)  Today's Treatment:   01/24/21 Soft tissue work to L gastroc/soleus, achilles tendon, plantar fascia, posterior tib Manually stretched ankle into dorsiflexion, flexor hallucis longus on L LE Gait training: 1 x 850' total (of 425' on grass and 425' on sidewalk), pt needed min A x 3 due to LOB when walking on grass Gait training: L hand on counter and R hand with cane: pt walks fwd and bwd with horizontal head turns: 3x 10 feet, vertical head turns: 3 x 10 feet- husband educated to be next to her when she is doing this at home. Both of them educated on practicing this once a day for HEP  01/19/21: Gait training: Figure 8 walking around 3 cones ON GRASS, 2 laps, 4 laps, 6 laps with straight cane, CGA to min A required due to occasional LOB Walking over 6" hurdle ON GRASS: 6 x with st. Cane and CGA to min A Pt's current cane's twist lock is broken. Recommended that the get a new cane with straight handle and potentially with tripod base for more stability                   01/15/21: Stair training: one cane and one rail: 12 steps, step to pattern, SBA, min cueing for sequencing when ascending stairs required Standing and working on diagonal stepping fwd/sides, bwd/sides: pt standing on stepping discs and PT calls out colors: initially supported with one cane and then towards the end pt using cane intermittently: 5-10 min. Standing deadlift: 25lbs from floor: 10x Standing fwd reach: progressing increased distance: 20x R and L Hand Gait training: cane 1050' with 500 feet in grass with CGA/Min A and on level ground SBA and occaisonal CGA for instability Pt educated to make sure she stops walking before she looks up or turns her head side to side to make sure she doesn't loose her balance with head turns. Pt educated to look ahead 10-15 feet on level ground and look down in front of her when walking on  uneven grounds.   01/12/21 Obstacle course: 3 cones (figure 8 walking); 2 x 6" hurldes, and stepping up on 4" mat (4 feet long): 6x total, CGA/minA Steps: cane in left hand 8 steps, cane in Right hand 8 steps, and contralateral cane, step to pattern, SBA/CGA: Picking up tissues from floor with use of cane: 6x, going up on ramp to pick up one tissue.   01/10/21: Gait training: following done with straight cane. - Walking figure 8 with 3 cones placed 3 feet apart, 2 x 10 laps with SBA - Gait training: 2 x 210' with SBA - Going up and down ramp 4x, SBA/CGA with modeate cueing for wider BOS and smaller steps and sequencing for stepping - Going up and down curb  step: 3x, SBA/CGA, moderate cueing for sequencing  - ambulated from gym to he car about 250' with st. Cane with SBA, cues required to get closer to push button to open doors to prevent reaching to improve safety awareness.  01/05/21: Gait training: - Walking figure 8 with 2 cones placed 3 feet apart, 8 laps - walking without AD: 4 x 40 feet, cues for arm swings - walking 1 x 220' without AD, cues for arm swings - box taps: 6" box: 20x R and L  12/15/20: Gait training: 1  x220' without AD and CGA for safety; 1 x 440' without AD with stepping over 4 x 2" hurdles x 4" wide Standing on foam with rigid top: stepping up and stepping off required min A, standing for 2' with CGA to min A 2 x 2' Stepping up on 4" box: no HHA, min A for PT required for LOB: 2 x 10 R and L  12/13/20: Gait training: 1 x 1175' st. Cane with CGA and RW incase of fatigue. Pt required 2x standing breaks for <10 seconds Standing up walking around 3x cones in figure 8 position with 180 deg turn and coming back around 3 cones before turning 180 deg and sitting back. Total 5x needed min A x 2 for LOB Gait training: 1 x 420' with st. Cane and SBA  12/08/20: Bed to chair with 180 deg turn: 10x without any AD and CGA, cues to lean fwd more when sitting down. Standing with  wide BOS: with bouching 65 cm swiss ball on floor and catching it: 20x Standing daedlift: 25lbs 2 x 5 Squat to 8" box with holding onto mat table: 2 x 5 with bil UE hold on bottom of mat.     Patient Education:  Education details: Pt educated on starting to use cane at home. Educated husband to stand next to her for safety for first 1-2 weeks until she is more confident. Educated pt to slow down and taking her time when walking with cane. Pt educated to wait 5-10 seconds when standing before she starts stepping to make sure she has good balance.  Person educated: Patient and Spouse Education method: Explanation Education comprehension: verbalized understanding     Home Exercise Program: Access Code: [email or web address] URL: [email or web address] Date: 10/13/2020 Prepared by: Markus Jarvis  Exercises Straight Leg Raise - 1 x daily - 7 x weekly - 2 sets - 10 reps Supine Bridge - 1 x daily - 7 x weekly - 2 sets - 10 reps Sidelying Hip Abduction - 1 x daily - 7 x weekly - 2 sets - 10 reps Seated Knee Extension with Resistance - 1 x daily - 7 x weekly - 2 sets - 10 reps Seated Hamstring Curl with Anchored Resistance - 1 x daily - 7 x weekly - 2 sets - 10 reps    Assessment: Clinical impression:  Pt demonstrated decreased ankle rolling on L LE on grass after manual massage and stretching in L ankle. Patient has absent mm activation with dorsiflexion, inversion and eversion. Patient has fair strength with L plantarflexors. Progressed gait on level ground with head turns today.  Rehab potential: Good   Clinical decision making: Stable/uncomplicated   Evaluation complexity: Moderate     Goals: Goals reviewed with patient? Yes   SHORT TERM GOALS:   STG Name Target Date Goal status  1 Pt will be be able to ambulate 1200' with RW to improve walking endurance Comments: Eval = 693' with RW; 1527'  with RW (11/22/20) 11/03/20 Goal met   2 Pt will demo compliance with HEP to improve  self management of symptoms. Comments: Eval = not issued; compliant 11/03/20 Goal met  3 Pt will get a shower chair and be able to sit and pivot into tub with min A from her husband. Got shower chair on 11/21/20 11/03/20 Goal met    LONG TERM GOALS:    LTG Name Target Date Goal status  1 Pt will be able to stand up from chair without HHA and without bracing knees against chair/bed to improve strength in bil LE Comments: Eval = needs to brace knees against chair to stand up; able to stand up but rquires CGA; able to stand without bracing knees (12/15/20) 12/01/20 Goal met 12/15/20  2 Pt will be able to ambulate >2000' with RW and without rest break to improve walking endurance Comments: Eval = 693' with RW;11/22/20 1527; 12/01/20 Goal met 12/15/20  3 Pt will be able to ambulate at least 800' with LRAD and without rest break to improve functional ambulation Comments: Eval = 693' with RW without rest break, 874' with st. Cane with CGA, 1175' with st. Cane with CGA (12/13/20); 1050' with st cane and CGA (12/15/20); 1050' with st. Cane and SBA/CGA 03/21/2021   Progressing 01/15/21  4 Pt will be able to don/doff her shoes/braces off at EOB to improve indepence. Comments: Eval =  Husband helps her with getting her shoes and braces and puts them on for her. Pt able to don/doff shoes but due to short legs her legs dangle and pt doesn't feel comfortable. 02/09/21 Progressing continue  12/15/20  5.  Pt will demo 120/120 sec on Modified CTSIB to improve standing balance on uneven surfaces 02/09/21 Progressing, continue 01/15/21    PLAN: PT frequency: 2x/week   PT duration: 8weeks   Planned Interventions: Therapeutic exercises, Therapeutic activity, Neuro Muscular re-education, Balance training, Gait training, Patient/Family education, Joint mobilization, Stair training, Orthotic/Fit training, Cryotherapy and Moist heat   Plan for next session: Soft tissue to ankles, gait on uneven grounds, gait on level groun with head  turns.   Kerrie Pleasure, PT 01/24/2021, [facility] [address] [place], [place], [address] Phone: [phone]   Fax:  [phone]  Patient name: Emily Cannon MRN: [id number] DOB: 1932-12-22

## 2021-01-26 ENCOUNTER — Other Ambulatory Visit: Payer: Self-pay

## 2021-01-26 ENCOUNTER — Ambulatory Visit: Payer: Medicare Other | Attending: Neurology

## 2021-01-26 DIAGNOSIS — R2689 Other abnormalities of gait and mobility: Secondary | ICD-10-CM | POA: Insufficient documentation

## 2021-01-26 DIAGNOSIS — M6281 Muscle weakness (generalized): Secondary | ICD-10-CM | POA: Diagnosis present

## 2021-01-26 NOTE — Therapy (Signed)
Outpatient Physical Therapy Note/ Progress Note  Patient Name: Emily Cannon MRN: [id number] DOB:1932-09-13, 87 y.o., female Today's Date: 01/26/2021   PT End of Session - 01/26/21 1451     Visit Number 23    Number of Visits 28    Date for PT Re-Evaluation 02/09/21    Authorization Type 10 v PN on 11/22/20; Recert on 1/77/11-6/57/90 (2x/week for 12 sessions), 10 v PN on 01/15/21    Progress Note Due on Visit 28    PT Start Time 1445    PT Stop Time 1530    PT Time Calculation (min) 45 min    Equipment Utilized During Treatment Gait belt    Activity Tolerance Patient tolerated treatment well    Behavior During Therapy WFL for tasks assessed/performed              PCP: Eulas Post, MD Referring Provier: Eulas Post, MD  Referring Diagnosis: R26.9 (ICD-10-CM) - Gait abnormality M35.9,G63 (ICD-10-CM) - 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 ([place] Visit Diagnosis: Other abnormalities of gait and mobility  Muscle weakness (generalized)  Past Medical History:  Diagnosis Date   Allergic rhinitis, cause unspecified    Allergy    Anxiety state, unspecified    Blood transfusion without reported diagnosis    2020   Cataract    removed both eyes    Chronic kidney disease    Disorder of bone and cartilage, unspecified    Esophageal candidiasis (HCC)    Foot drop    Gastric ulcer    History of CMV    History of Helicobacter pylori infection    Hypothyroid    Irritable bowel syndrome    Lumbago    Microscopic polyangiitis ([place])    Other and unspecified hyperlipidemia    Perforation of tympanic membrane, unspecified    Unspecified essential hypertension    Past Surgical History:  Procedure Laterality Date   ABDOMINAL HYSTERECTOMY     BIOPSY  01/02/2019   Procedure: BIOPSY;  Surgeon: Yetta Flock, MD;  Location: [facility] ENDOSCOPY;  Service: Gastroenterology;;   cataract surgery  5/12 and 12/24/2012   both eyes   COLONOSCOPY      COLONOSCOPY WITH PROPOFOL N/A 01/02/2019   Procedure: COLONOSCOPY WITH PROPOFOL;  Surgeon: Yetta Flock, MD;  Location: [facility] ENDOSCOPY;  Service: Gastroenterology;  Laterality: N/A;   ESOPHAGOGASTRODUODENOSCOPY (EGD) WITH PROPOFOL N/A 01/02/2019   Procedure: ESOPHAGOGASTRODUODENOSCOPY (EGD) WITH PROPOFOL;  Surgeon: Yetta Flock, MD;  Location: [place];  Service: Gastroenterology;  Laterality: N/A;   NSVD     x1   POLYPECTOMY  01/02/2019   Procedure: POLYPECTOMY;  Surgeon: Yetta Flock, MD;  Location: [facility] ENDOSCOPY;  Service: Gastroenterology;;   POLYPECTOMY     TONSILLECTOMY     UPPER GASTROINTESTINAL ENDOSCOPY     Patient Active Problem List   Diagnosis Date Noted   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 ([place]) 09/25/2020   Peripheral neuropathy 05/08/2020   Weakness 03/28/2020   Gait abnormality 03/28/2020   Paresthesia 03/28/2020   Left foot drop 03/23/2020   Heme positive stool    Benign neoplasm of colon    Acute blood loss anemia 12/30/2018   Symptomatic anemia 12/15/2018   AKI (acute kidney injury) ([place]) 12/15/2018   Hypomagnesemia 12/15/2018   Steroid-induced hyperglycemia 12/15/2018   Microscopic polyangiitis ([place]) 11/13/2018   Positive P-ANCA titer 10/14/2018   Viral URI 05/12/2018   Syncope 06/05/2016   Unspecified venous (peripheral) insufficiency 11/23/2012  Dermatitis 11/10/2012   Hypothyroidism 05/22/2011   TYMPANIC MEMBRANE PERFORATION, LEFT EAR 08/08/2007   Osteopenia 07/27/2007   Hyperlipidemia 05/11/2007   Anxiety state 05/11/2007   Essential hypertension 05/11/2007   ALLERGIC RHINITIS 05/11/2007   Irritable bowel syndrome 05/11/2007   LOW BACK PAIN 05/11/2007   HYSTERECTOMY, HX OF 05/11/2007    SUBJECTIVE: She has practiced with cane at her countertop with head turns.  Pain: is patient experiencing pain? No.    OBJECTIVE:  Modified CTSIB: Test 1: 30 sec Test 2: 30 sec Test 3: 30 sec Test 4: 10 sec Total:  100 sec/120 sec  6 MWT (01/05/21): 510 feet with st. Cane Gait speed: 10 meter walk test: 0.50ms with st. Cane (01/05/21)  Today's Treatment:   01/26/21: Gait training: L hand on counter and R hand with cane: pt walks fwd and bwd with horizontal head turns: 5 x 10 feet Step ladder:  Reciprocating steps: 4 x 10 feet fwd with st. Cane Lateral stepping: 1 x 10 feet R and L, with cane In in out out: lateral: 1 x 10 feet R and L with cane Gait training: 1 x 850' total (of 425' on grass and 425' on sidewalk), pt needed min A x 3 due to LOB when walking on grass 01/24/21 Soft tissue work to LRohm and Haas achilles tendon, plantar fascia, posterior tib Manually stretched ankle into dorsiflexion, flexor hallucis longus on L LE Gait training: 1 x 850' total (of 425' on grass and 425' on sidewalk), pt needed min A x 3 due to LOB when walking on grass Gait training: L hand on counter and R hand with cane: pt walks fwd and bwd with horizontal head turns: 3x 10 feet, vertical head turns: 3 x 10 feet- husband educated to be next to her when she is doing this at home. Both of them educated on practicing this once a day for HEP  01/19/21: Gait training: Figure 8 walking around 3 cones ON GRASS, 2 laps, 4 laps, 6 laps with straight cane, CGA to min A required due to occasional LOB Walking over 6" hurdle ON GRASS: 6 x with st. Cane and CGA to min A Pt's current cane's twist lock is broken. Recommended that the get a new cane with straight handle and potentially with tripod base for more stability                   01/15/21: Stair training: one cane and one rail: 12 steps, step to pattern, SBA, min cueing for sequencing when ascending stairs required Standing and working on diagonal stepping fwd/sides, bwd/sides: pt standing on stepping discs and PT calls out colors: initially supported with one cane and then towards the end pt using cane intermittently: 5-10 min. Standing deadlift: 25lbs from floor:  10x Standing fwd reach: progressing increased distance: 20x R and L Hand Gait training: cane 1050' with 500 feet in grass with CGA/Min A and on level ground SBA and occaisonal CGA for instability Pt educated to make sure she stops walking before she looks up or turns her head side to side to make sure she doesn't loose her balance with head turns. Pt educated to look ahead 10-15 feet on level ground and look down in front of her when walking on uneven grounds.   01/12/21 Obstacle course: 3 cones (figure 8 walking); 2 x 6" hurldes, and stepping up on 4" mat (4 feet long): 6x total, CGA/minA Steps: cane in left hand 8 steps, cane in Right  hand 8 steps, and contralateral cane, step to pattern, SBA/CGA: Picking up tissues from floor with use of cane: 6x, going up on ramp to pick up one tissue.   01/10/21: Gait training: following done with straight cane. - Walking figure 8 with 3 cones placed 3 feet apart, 2 x 10 laps with SBA - Gait training: 2 x 210' with SBA - Going up and down ramp 4x, SBA/CGA with modeate cueing for wider BOS and smaller steps and sequencing for stepping - Going up and down curb step: 3x, SBA/CGA, moderate cueing for sequencing  - ambulated from gym to he car about 250' with st. Cane with SBA, cues required to get closer to push button to open doors to prevent reaching to improve safety awareness.  01/05/21: Gait training: - Walking figure 8 with 2 cones placed 3 feet apart, 8 laps - walking without AD: 4 x 40 feet, cues for arm swings - walking 1 x 220' without AD, cues for arm swings - box taps: 6" box: 20x R and L  12/15/20: Gait training: 1  x220' without AD and CGA for safety; 1 x 440' without AD with stepping over 4 x 2" hurdles x 4" wide Standing on foam with rigid top: stepping up and stepping off required min A, standing for 2' with CGA to min A 2 x 2' Stepping up on 4" box: no HHA, min A for PT required for LOB: 2 x 10 R and L  12/13/20: Gait training: 1 x  1175' st. Cane with CGA and RW incase of fatigue. Pt required 2x standing breaks for <10 seconds Standing up walking around 3x cones in figure 8 position with 180 deg turn and coming back around 3 cones before turning 180 deg and sitting back. Total 5x needed min A x 2 for LOB Gait training: 1 x 420' with st. Cane and SBA  12/08/20: Bed to chair with 180 deg turn: 10x without any AD and CGA, cues to lean fwd more when sitting down. Standing with wide BOS: with bouching 65 cm swiss ball on floor and catching it: 20x Standing daedlift: 25lbs 2 x 5 Squat to 8" box with holding onto mat table: 2 x 5 with bil UE hold on bottom of mat.     Patient Education:  Education details: Pt educated on starting to use cane at home. Educated husband to stand next to her for safety for first 1-2 weeks until she is more confident. Educated pt to slow down and taking her time when walking with cane. Pt educated to wait 5-10 seconds when standing before she starts stepping to make sure she has good balance.  Person educated: Patient and Spouse Education method: Explanation Education comprehension: verbalized understanding     Home Exercise Program: Access Code: [email or web address] URL: [email or web address] Date: 10/13/2020 Prepared by: Markus Jarvis  Exercises Straight Leg Raise - 1 x daily - 7 x weekly - 2 sets - 10 reps Supine Bridge - 1 x daily - 7 x weekly - 2 sets - 10 reps Sidelying Hip Abduction - 1 x daily - 7 x weekly - 2 sets - 10 reps Seated Knee Extension with Resistance - 1 x daily - 7 x weekly - 2 sets - 10 reps Seated Hamstring Curl with Anchored Resistance - 1 x daily - 7 x weekly - 2 sets - 10 reps    Assessment: Clinical impression:  Pt was challanged with step ladder to take longer steps fwd, lateral and diagonally.  She demonstrated gradually improving stability when ambulating over grass but still required min A at times due to LOB.  Rehab potential: Good   Clinical  decision making: Stable/uncomplicated   Evaluation complexity: Moderate     Goals: Goals reviewed with patient? Yes   SHORT TERM GOALS:   STG Name Target Date Goal status  1 Pt will be be able to ambulate 1200' with RW to improve walking endurance Comments: Eval = 693' with RW; 1527' with RW (11/22/20) 11/03/20 Goal met   2 Pt will demo compliance with HEP to improve self management of symptoms. Comments: Eval = not issued; compliant 11/03/20 Goal met  3 Pt will get a shower chair and be able to sit and pivot into tub with min A from her husband. Got shower chair on 11/21/20 11/03/20 Goal met    LONG TERM GOALS:    LTG Name Target Date Goal status  1 Pt will be able to stand up from chair without HHA and without bracing knees against chair/bed to improve strength in bil LE Comments: Eval = needs to brace knees against chair to stand up; able to stand up but rquires CGA; able to stand without bracing knees (12/15/20) 12/01/20 Goal met 12/15/20  2 Pt will be able to ambulate >2000' with RW and without rest break to improve walking endurance Comments: Eval = 693' with RW;11/22/20 1527; 12/01/20 Goal met 12/15/20  3 Pt will be able to ambulate at least 800' with LRAD and without rest break to improve functional ambulation Comments: Eval = 693' with RW without rest break, 874' with st. Cane with CGA, 1175' with st. Cane with CGA (12/13/20); 1050' with st cane and CGA (12/15/20); 1050' with st. Cane and SBA/CGA 03/23/2021   Progressing 01/15/21  4 Pt will be able to don/doff her shoes/braces off at EOB to improve indepence. Comments: Eval =  Husband helps her with getting her shoes and braces and puts them on for her. Pt able to don/doff shoes but due to short legs her legs dangle and pt doesn't feel comfortable. 02/09/21 Progressing continue  12/15/20  5.  Pt will demo 120/120 sec on Modified CTSIB to improve standing balance on uneven surfaces 02/09/21 Progressing, continue 01/15/21    PLAN: PT frequency:  2x/week   PT duration: 8weeks   Planned Interventions: Therapeutic exercises, Therapeutic activity, Neuro Muscular re-education, Balance training, Gait training, Patient/Family education, Joint mobilization, Stair training, Orthotic/Fit training, Cryotherapy and Moist heat   Plan for next session: Soft tissue to ankles, gait on uneven grounds, gait on level groun with head turns.   Kerrie Pleasure, PT 01/26/2021, [facility] [address] [place], [place], [address] Phone: [phone]   Fax:  [phone]  Patient name: Emily Cannon MRN: [id number] DOB: [birth year]

## 2021-01-31 ENCOUNTER — Other Ambulatory Visit: Payer: Self-pay

## 2021-01-31 ENCOUNTER — Ambulatory Visit: Payer: Medicare Other

## 2021-01-31 DIAGNOSIS — M6281 Muscle weakness (generalized): Secondary | ICD-10-CM

## 2021-01-31 DIAGNOSIS — R2689 Other abnormalities of gait and mobility: Secondary | ICD-10-CM

## 2021-01-31 NOTE — Therapy (Signed)
Outpatient Physical Therapy Note/ Progress Note  Patient Name: Emily Cannon MRN: [id number] DOB:1933/02/08, 87 y.o., female Today's Date: 01/31/2021   PT End of Session - 01/31/21 1455     Visit Number 24    Number of Visits 28    Date for PT Re-Evaluation 02/09/21    Authorization Type 10 v PN on 11/22/20; Recert on 04/01/99-04/20/29 (2x/week for 12 sessions), 10 v PN on 01/15/21    Progress Note Due on Visit 28    PT Start Time 1450    PT Stop Time 1530    PT Time Calculation (min) 40 min    Equipment Utilized During Treatment Gait belt    Activity Tolerance Patient tolerated treatment well    Behavior During Therapy WFL for tasks assessed/performed              PCP: Eulas Post, MD Referring Provier: Marcial Pacas, MD  Referring Diagnosis: R26.9 (ICD-10-CM) - Gait abnormality M35.9,G63 (ICD-10-CM) - 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 ([place] Visit Diagnosis: Other abnormalities of gait and mobility  Muscle weakness (generalized)  Past Medical History:  Diagnosis Date   Allergic rhinitis, cause unspecified    Allergy    Anxiety state, unspecified    Blood transfusion without reported diagnosis    2020   Cataract    removed both eyes    Chronic kidney disease    Disorder of bone and cartilage, unspecified    Esophageal candidiasis (HCC)    Foot drop    Gastric ulcer    History of CMV    History of Helicobacter pylori infection    Hypothyroid    Irritable bowel syndrome    Lumbago    Microscopic polyangiitis (HCC)    Other and unspecified hyperlipidemia    Perforation of tympanic membrane, unspecified    Unspecified essential hypertension    Past Surgical History:  Procedure Laterality Date   ABDOMINAL HYSTERECTOMY     BIOPSY  01/02/2019   Procedure: BIOPSY;  Surgeon: Yetta Flock, MD;  Location: [facility];  Service: Gastroenterology;;   cataract surgery  5/12 and 12/24/2012   both eyes   COLONOSCOPY     COLONOSCOPY WITH  PROPOFOL N/A 01/02/2019   Procedure: COLONOSCOPY WITH PROPOFOL;  Surgeon: Yetta Flock, MD;  Location: [facility];  Service: Gastroenterology;  Laterality: N/A;   ESOPHAGOGASTRODUODENOSCOPY (EGD) WITH PROPOFOL N/A 01/02/2019   Procedure: ESOPHAGOGASTRODUODENOSCOPY (EGD) WITH PROPOFOL;  Surgeon: Yetta Flock, MD;  Location: [facility];  Service: Gastroenterology;  Laterality: N/A;   NSVD     x1   POLYPECTOMY  01/02/2019   Procedure: POLYPECTOMY;  Surgeon: Yetta Flock, MD;  Location: [facility] ENDOSCOPY;  Service: Gastroenterology;;   POLYPECTOMY     TONSILLECTOMY     UPPER GASTROINTESTINAL ENDOSCOPY     Patient Active Problem List   Diagnosis Date Noted   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 ([place]) 09/25/2020   Peripheral neuropathy 05/08/2020   Weakness 03/28/2020   Gait abnormality 03/28/2020   Paresthesia 03/28/2020   Left foot drop 03/23/2020   Heme positive stool    Benign neoplasm of colon    Acute blood loss anemia 12/30/2018   Symptomatic anemia 12/15/2018   AKI (acute kidney injury) ([place]) 12/15/2018   Hypomagnesemia 12/15/2018   Steroid-induced hyperglycemia 12/15/2018   Microscopic polyangiitis ([place]) 11/13/2018   Positive P-ANCA titer 10/14/2018   Viral URI 05/12/2018   Syncope 06/05/2016   Unspecified venous (peripheral) insufficiency 11/23/2012   Dermatitis  11/10/2012   Hypothyroidism 05/22/2011   TYMPANIC MEMBRANE PERFORATION, LEFT EAR 08/08/2007   Osteopenia 07/27/2007   Hyperlipidemia 05/11/2007   Anxiety state 05/11/2007   Essential hypertension 05/11/2007   ALLERGIC RHINITIS 05/11/2007   Irritable bowel syndrome 05/11/2007   LOW BACK PAIN 05/11/2007   HYSTERECTOMY, HX OF 05/11/2007    SUBJECTIVE: She has practiced with cane at her countertop with head turns.  Pain: is patient experiencing pain? No.    OBJECTIVE:  Modified CTSIB: Test 1: 30 sec Test 2: 30 sec Test 3: 30 sec Test 4: 10 sec Total: 100 sec/120  sec  6 MWT (01/05/21): 510 feet with st. Cane Gait speed: 10 meter walk test: 0.22ms with st. Cane (01/05/21)  Today's Treatment:  01/31/21: Obstacle course x 5: total distance of loop: 220', including 2 cones for figure 8 walking, 2 hurdles (1 x 6" and 1 x 8", walking on balance beam, walking with horizontal and vertical head turns, fast walking, ramp up and down, curb step up and down, 4 steps with cane and one rail.  01/26/21: Gait training: L hand on counter and R hand with cane: pt walks fwd and bwd with horizontal head turns: 5 x 10 feet Step ladder:  Reciprocating steps: 4 x 10 feet fwd with st. Cane Lateral stepping: 1 x 10 feet R and L, with cane In in out out: lateral: 1 x 10 feet R and L with cane Gait training: 1 x 850' total (of 425' on grass and 425' on sidewalk), pt needed min A x 3 due to LOB when walking on grass 01/24/21 Soft tissue work to LRohm and Haas achilles tendon, plantar fascia, posterior tib Manually stretched ankle into dorsiflexion, flexor hallucis longus on L LE Gait training: 1 x 850' total (of 425' on grass and 425' on sidewalk), pt needed min A x 3 due to LOB when walking on grass Gait training: L hand on counter and R hand with cane: pt walks fwd and bwd with horizontal head turns: 3x 10 feet, vertical head turns: 3 x 10 feet- husband educated to be next to her when she is doing this at home. Both of them educated on practicing this once a day for HEP  01/19/21: Gait training: Figure 8 walking around 3 cones ON GRASS, 2 laps, 4 laps, 6 laps with straight cane, CGA to min A required due to occasional LOB Walking over 6" hurdle ON GRASS: 6 x with st. Cane and CGA to min A Pt's current cane's twist lock is broken. Recommended that the get a new cane with straight handle and potentially with tripod base for more stability                   01/15/21: Stair training: one cane and one rail: 12 steps, step to pattern, SBA, min cueing for sequencing when ascending  stairs required Standing and working on diagonal stepping fwd/sides, bwd/sides: pt standing on stepping discs and PT calls out colors: initially supported with one cane and then towards the end pt using cane intermittently: 5-10 min. Standing deadlift: 25lbs from floor: 10x Standing fwd reach: progressing increased distance: 20x R and L Hand Gait training: cane 1050' with 500 feet in grass with CGA/Min A and on level ground SBA and occaisonal CGA for instability Pt educated to make sure she stops walking before she looks up or turns her head side to side to make sure she doesn't loose her balance with head turns. Pt educated to look  ahead 10-15 feet on level ground and look down in front of her when walking on uneven grounds.   01/12/21 Obstacle course: 3 cones (figure 8 walking); 2 x 6" hurldes, and stepping up on 4" mat (4 feet long): 6x total, CGA/minA Steps: cane in left hand 8 steps, cane in Right hand 8 steps, and contralateral cane, step to pattern, SBA/CGA: Picking up tissues from floor with use of cane: 6x, going up on ramp to pick up one tissue.   01/10/21: Gait training: following done with straight cane. - Walking figure 8 with 3 cones placed 3 feet apart, 2 x 10 laps with SBA - Gait training: 2 x 210' with SBA - Going up and down ramp 4x, SBA/CGA with modeate cueing for wider BOS and smaller steps and sequencing for stepping - Going up and down curb step: 3x, SBA/CGA, moderate cueing for sequencing  - ambulated from gym to he car about 250' with st. Cane with SBA, cues required to get closer to push button to open doors to prevent reaching to improve safety awareness.  01/05/21: Gait training: - Walking figure 8 with 2 cones placed 3 feet apart, 8 laps - walking without AD: 4 x 40 feet, cues for arm swings - walking 1 x 220' without AD, cues for arm swings - box taps: 6" box: 20x R and L  12/15/20: Gait training: 1  x220' without AD and CGA for safety; 1 x 440' without AD with  stepping over 4 x 2" hurdles x 4" wide Standing on foam with rigid top: stepping up and stepping off required min A, standing for 2' with CGA to min A 2 x 2' Stepping up on 4" box: no HHA, min A for PT required for LOB: 2 x 10 R and L  12/13/20: Gait training: 1 x 1175' st. Cane with CGA and RW incase of fatigue. Pt required 2x standing breaks for <10 seconds Standing up walking around 3x cones in figure 8 position with 180 deg turn and coming back around 3 cones before turning 180 deg and sitting back. Total 5x needed min A x 2 for LOB Gait training: 1 x 420' with st. Cane and SBA  12/08/20: Bed to chair with 180 deg turn: 10x without any AD and CGA, cues to lean fwd more when sitting down. Standing with wide BOS: with bouching 65 cm swiss ball on floor and catching it: 20x Standing daedlift: 25lbs 2 x 5 Squat to 8" box with holding onto mat table: 2 x 5 with bil UE hold on bottom of mat.     Patient Education:  Education details: Pt educated on starting to use cane at home. Educated husband to stand next to her for safety for first 1-2 weeks until she is more confident. Educated pt to slow down and taking her time when walking with cane. Pt educated to wait 5-10 seconds when standing before she starts stepping to make sure she has good balance.  Person educated: Patient and Spouse Education method: Explanation Education comprehension: verbalized understanding     Home Exercise Program: Access Code: [email or web address] URL: [email or web address] Date: 10/13/2020 Prepared by: Markus Jarvis  Exercises Straight Leg Raise - 1 x daily - 7 x weekly - 2 sets - 10 reps Supine Bridge - 1 x daily - 7 x weekly - 2 sets - 10 reps Sidelying Hip Abduction - 1 x daily - 7 x weekly - 2 sets - 10 reps Seated Knee Extension with Resistance -  1 x daily - 7 x weekly - 2 sets - 10 reps Seated Hamstring Curl with Anchored Resistance - 1 x daily - 7 x weekly - 2 sets - 10 reps     Assessment: Clinical impression:  Pt still requires some cueing for sequencing and cane placement during obstale course. Required min A for balance beam walking otherwise CGA for most of the obstacle course today.  Rehab potential: Good   Clinical decision making: Stable/uncomplicated   Evaluation complexity: Moderate     Goals: Goals reviewed with patient? Yes   SHORT TERM GOALS:   STG Name Target Date Goal status  1 Pt will be be able to ambulate 1200' with RW to improve walking endurance Comments: Eval = 693' with RW; 1527' with RW (11/22/20) 11/03/20 Goal met   2 Pt will demo compliance with HEP to improve self management of symptoms. Comments: Eval = not issued; compliant 11/03/20 Goal met  3 Pt will get a shower chair and be able to sit and pivot into tub with min A from her husband. Got shower chair on 11/21/20 11/03/20 Goal met    LONG TERM GOALS:    LTG Name Target Date Goal status  1 Pt will be able to stand up from chair without HHA and without bracing knees against chair/bed to improve strength in bil LE Comments: Eval = needs to brace knees against chair to stand up; able to stand up but rquires CGA; able to stand without bracing knees (12/15/20) 12/01/20 Goal met 12/15/20  2 Pt will be able to ambulate >2000' with RW and without rest break to improve walking endurance Comments: Eval = 693' with RW;11/22/20 1527; 12/01/20 Goal met 12/15/20  3 Pt will be able to ambulate at least 800' with LRAD and without rest break to improve functional ambulation Comments: Eval = 693' with RW without rest break, 874' with st. Cane with CGA, 1175' with st. Cane with CGA (12/13/20); 1050' with st cane and CGA (12/15/20); 1050' with st. Cane and SBA/CGA 03/28/2021   Progressing 01/15/21  4 Pt will be able to don/doff her shoes/braces off at EOB to improve indepence. Comments: Eval =  Husband helps her with getting her shoes and braces and puts them on for her. Pt able to don/doff shoes but due to short  legs her legs dangle and pt doesn't feel comfortable. 02/09/21 Progressing continue  12/15/20  5.  Pt will demo 120/120 sec on Modified CTSIB to improve standing balance on uneven surfaces 02/09/21 Progressing, continue 01/15/21    PLAN: PT frequency: 2x/week   PT duration: 8weeks   Planned Interventions: Therapeutic exercises, Therapeutic activity, Neuro Muscular re-education, Balance training, Gait training, Patient/Family education, Joint mobilization, Stair training, Orthotic/Fit training, Cryotherapy and Moist heat   Plan for next session: Soft tissue to ankles, gait on uneven grounds, gait on level groun with head turns.   Kerrie Pleasure, PT 01/31/2021, [facility] [address] [place], [place], [address] Phone: [phone]   Fax:  [phone]  Patient name: Emily Cannon MRN: [id number] DOB: Apr 17, 1933

## 2021-02-02 ENCOUNTER — Ambulatory Visit: Payer: Medicare Other

## 2021-02-02 ENCOUNTER — Other Ambulatory Visit: Payer: Self-pay

## 2021-02-02 DIAGNOSIS — R2689 Other abnormalities of gait and mobility: Secondary | ICD-10-CM | POA: Diagnosis not present

## 2021-02-02 DIAGNOSIS — M6281 Muscle weakness (generalized): Secondary | ICD-10-CM

## 2021-02-02 NOTE — Therapy (Signed)
Outpatient Physical Therapy Note/ Progress Note  Patient Name: Emily Cannon MRN: [id number] DOB:1933-03-31, 87 y.o., female Today's Date: 02/02/2021   PT End of Session - 02/02/21 1446     Visit Number 25    Number of Visits 28    Date for PT Re-Evaluation 02/09/21    Authorization Type 10 v PN on 11/22/20; Recert on 9/41/74-0/81/44 (2x/week for 12 sessions), 10 v PN on 01/15/21    Progress Note Due on Visit 28    PT Start Time 1445    PT Stop Time 1530    PT Time Calculation (min) 45 min    Equipment Utilized During Treatment Gait belt    Activity Tolerance Patient tolerated treatment well    Behavior During Therapy WFL for tasks assessed/performed              PCP: Eulas Post, MD Referring Provier: Marcial Pacas, MD  Referring Diagnosis: R26.9 (ICD-10-CM) - Gait abnormality M35.9,G63 (ICD-10-CM) - 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 ([place] Visit Diagnosis: Muscle weakness (generalized)  Other abnormalities of gait and mobility  Past Medical History:  Diagnosis Date   Allergic rhinitis, cause unspecified    Allergy    Anxiety state, unspecified    Blood transfusion without reported diagnosis    2020   Cataract    removed both eyes    Chronic kidney disease    Disorder of bone and cartilage, unspecified    Esophageal candidiasis (HCC)    Foot drop    Gastric ulcer    History of CMV    History of Helicobacter pylori infection    Hypothyroid    Irritable bowel syndrome    Lumbago    Microscopic polyangiitis (HCC)    Other and unspecified hyperlipidemia    Perforation of tympanic membrane, unspecified    Unspecified essential hypertension    Past Surgical History:  Procedure Laterality Date   ABDOMINAL HYSTERECTOMY     BIOPSY  01/02/2019   Procedure: BIOPSY;  Surgeon: Yetta Flock, MD;  Location: [facility] ENDOSCOPY;  Service: Gastroenterology;;   cataract surgery  5/12 and 12/24/2012   both eyes   COLONOSCOPY     COLONOSCOPY WITH  PROPOFOL N/A 01/02/2019   Procedure: COLONOSCOPY WITH PROPOFOL;  Surgeon: Yetta Flock, MD;  Location: [facility];  Service: Gastroenterology;  Laterality: N/A;   ESOPHAGOGASTRODUODENOSCOPY (EGD) WITH PROPOFOL N/A 01/02/2019   Procedure: ESOPHAGOGASTRODUODENOSCOPY (EGD) WITH PROPOFOL;  Surgeon: Yetta Flock, MD;  Location: [facility];  Service: Gastroenterology;  Laterality: N/A;   NSVD     x1   POLYPECTOMY  01/02/2019   Procedure: POLYPECTOMY;  Surgeon: Yetta Flock, MD;  Location: [facility] ENDOSCOPY;  Service: Gastroenterology;;   POLYPECTOMY     TONSILLECTOMY     UPPER GASTROINTESTINAL ENDOSCOPY     Patient Active Problem List   Diagnosis Date Noted   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 ([place]) 09/25/2020   Peripheral neuropathy 05/08/2020   Weakness 03/28/2020   Gait abnormality 03/28/2020   Paresthesia 03/28/2020   Left foot drop 03/23/2020   Heme positive stool    Benign neoplasm of colon    Acute blood loss anemia 12/30/2018   Symptomatic anemia 12/15/2018   AKI (acute kidney injury) ([place]) 12/15/2018   Hypomagnesemia 12/15/2018   Steroid-induced hyperglycemia 12/15/2018   Microscopic polyangiitis ([place]) 11/13/2018   Positive P-ANCA titer 10/14/2018   Viral URI 05/12/2018   Syncope 06/05/2016   Unspecified venous (peripheral) insufficiency 11/23/2012   Dermatitis  11/10/2012   Hypothyroidism 05/22/2011   TYMPANIC MEMBRANE PERFORATION, LEFT EAR 08/08/2007   Osteopenia 07/27/2007   Hyperlipidemia 05/11/2007   Anxiety state 05/11/2007   Essential hypertension 05/11/2007   ALLERGIC RHINITIS 05/11/2007   Irritable bowel syndrome 05/11/2007   LOW BACK PAIN 05/11/2007   HYSTERECTOMY, HX OF 05/11/2007    SUBJECTIVE: She has practiced with cane at her countertop with head turns.  Pain: is patient experiencing pain? No.    OBJECTIVE:  Modified CTSIB: Test 1: 30 sec Test 2: 30 sec Test 3: 30 sec Test 4: 10 sec Total: 100 sec/120  sec  6 MWT (01/05/21): 510 feet with st. Cane Gait speed: 10 meter walk test: 0.106ms with st. Cane (01/05/21)  Today's Treatment:  02/02/21: Gait training: - walking up hill on grass: 50 feet, down hill on grass: 50 feet, walking through 4 feet x 2 of pine needles, walking up and down grass curb step, with st. Cane, CGA  - Walking on level sidewalk: 1050 feet with st. Cane SBA Straddle step up and down: laterall: 10x on airex with intermittent counter support Gait training: no AD: 115' total fwd, frequent changing of direction by PT while walking fwd, bwd, lateral to R and L Stepping over 2 inch tall and 4" wide foam beams: step to pattern, 4 foam beams: 2 x leading with R leg, 2x leading with L leg  01/31/21: Obstacle course x 5: total distance of loop: 220', including 2 cones for figure 8 walking, 2 hurdles (1 x 6" and 1 x 8", walking on balance beam, walking with horizontal and vertical head turns, fast walking, ramp up and down, curb step up and down, 4 steps with cane and one rail.  01/26/21: Gait training: L hand on counter and R hand with cane: pt walks fwd and bwd with horizontal head turns: 5 x 10 feet Step ladder:  Reciprocating steps: 4 x 10 feet fwd with st. Cane Lateral stepping: 1 x 10 feet R and L, with cane In in out out: lateral: 1 x 10 feet R and L with cane Gait training: 1 x 850' total (of 425' on grass and 425' on sidewalk), pt needed min A x 3 due to LOB when walking on grass 01/24/21 Soft tissue work to LRohm and Haas achilles tendon, plantar fascia, posterior tib Manually stretched ankle into dorsiflexion, flexor hallucis longus on L LE Gait training: 1 x 850' total (of 425' on grass and 425' on sidewalk), pt needed min A x 3 due to LOB when walking on grass Gait training: L hand on counter and R hand with cane: pt walks fwd and bwd with horizontal head turns: 3x 10 feet, vertical head turns: 3 x 10 feet- husband educated to be next to her when she is doing this at  home. Both of them educated on practicing this once a day for   Patient Education:  Education details: Pt educated on starting to use cane at home. Educated husband to stand next to her for safety for first 1-2 weeks until she is more confident. Educated pt to slow down and taking her time when walking with cane. Pt educated to wait 5-10 seconds when standing before she starts stepping to make sure she has good balance.  Person educated: Patient and Spouse Education method: Explanation Education comprehension: verbalized understanding     Home Exercise Program: Access Code: [email or web address]: [email or web address] Date: 10/13/2020 Prepared by: KMarkus Jarvis Exercises Straight Leg Raise - 1 x daily -  7 x weekly - 2 sets - 10 reps Supine Bridge - 1 x daily - 7 x weekly - 2 sets - 10 reps Sidelying Hip Abduction - 1 x daily - 7 x weekly - 2 sets - 10 reps Seated Knee Extension with Resistance - 1 x daily - 7 x weekly - 2 sets - 10 reps Seated Hamstring Curl with Anchored Resistance - 1 x daily - 7 x weekly - 2 sets - 10 reps    Assessment: Clinical impression:  Pt required CGA only when walking up and down grassy hill and curb with cane today. Pt required SBA when walking on level ground today. Pt progressed to gait training without AD for in clinic today.  Rehab potential: Good   Clinical decision making: Stable/uncomplicated   Evaluation complexity: Moderate     Goals: Goals reviewed with patient? Yes   SHORT TERM GOALS:   STG Name Target Date Goal status  1 Pt will be be able to ambulate 1200' with RW to improve walking endurance Comments: Eval = 693' with RW; 1527' with RW (11/22/20) 11/03/20 Goal met   2 Pt will demo compliance with HEP to improve self management of symptoms. Comments: Eval = not issued; compliant 11/03/20 Goal met  3 Pt will get a shower chair and be able to sit and pivot into tub with min A from her husband. Got shower chair on 11/21/20 11/03/20  Goal met    LONG TERM GOALS:    LTG Name Target Date Goal status  1 Pt will be able to stand up from chair without HHA and without bracing knees against chair/bed to improve strength in bil LE Comments: Eval = needs to brace knees against chair to stand up; able to stand up but rquires CGA; able to stand without bracing knees (12/15/20) 12/01/20 Goal met 12/15/20  2 Pt will be able to ambulate >2000' with RW and without rest break to improve walking endurance Comments: Eval = 693' with RW;11/22/20 1527; 12/01/20 Goal met 12/15/20  3 Pt will be able to ambulate at least 800' with LRAD and without rest break to improve functional ambulation Comments: Eval = 693' with RW without rest break, 874' with st. Cane with CGA, 1175' with st. Cane with CGA (12/13/20); 1050' with st cane and CGA (12/15/20); 1050' with st. Cane and SBA/CGA 03/30/2021   Progressing 01/15/21  4 Pt will be able to don/doff her shoes/braces off at EOB to improve indepence. Comments: Eval =  Husband helps her with getting her shoes and braces and puts them on for her. Pt able to don/doff shoes but due to short legs her legs dangle and pt doesn't feel comfortable. 02/09/21 Progressing continue  12/15/20  5.  Pt will demo 120/120 sec on Modified CTSIB to improve standing balance on uneven surfaces 02/09/21 Progressing, continue 01/15/21    PLAN: PT frequency: 2x/week   PT duration: 8weeks   Planned Interventions: Therapeutic exercises, Therapeutic activity, Neuro Muscular re-education, Balance training, Gait training, Patient/Family education, Joint mobilization, Stair training, Orthotic/Fit training, Cryotherapy and Moist heat   Plan for next session: Soft tissue to ankles, gait on uneven grounds, gait on level groun with head turns.   Kerrie Pleasure, PT 02/02/2021, [place] [address] [place], [place], [address] Phone: [phone]   Fax:  [phone]  Patient  name: Emily Cannon MRN: [id number] DOB: 12-01-1932

## 2021-02-07 ENCOUNTER — Ambulatory Visit: Payer: Medicare Other

## 2021-02-08 ENCOUNTER — Other Ambulatory Visit: Payer: Self-pay | Admitting: Gastroenterology

## 2021-02-09 ENCOUNTER — Ambulatory Visit: Payer: Medicare Other

## 2021-02-09 ENCOUNTER — Other Ambulatory Visit: Payer: Self-pay

## 2021-02-09 DIAGNOSIS — R2689 Other abnormalities of gait and mobility: Secondary | ICD-10-CM | POA: Diagnosis not present

## 2021-02-09 DIAGNOSIS — M6281 Muscle weakness (generalized): Secondary | ICD-10-CM

## 2021-02-09 NOTE — Therapy (Signed)
Outpatient Physical Re-certification Note  Patient Name: Emily Cannon MRN: [id number] DOB:1933/01/02, 87 y.o., female Today's Date: 02/09/2021   PT End of Session - 02/09/21 1458     Visit Number 26    Number of Visits 32    Date for PT Re-Evaluation 04/06/21    Authorization Type 10 v PN on 11/22/20; Recert on 7/89/38-07/29/73 (2x/week for 12 sessions), 10 v PN on 07/30/56; recert 12/22/76 to 09/01/21 (1x/week for 6 sessions)    Progress Note Due on Visit 32    PT Start Time 1445    PT Stop Time 1530    PT Time Calculation (min) 45 min    Equipment Utilized During Treatment Gait belt    Activity Tolerance Patient tolerated treatment well    Behavior During Therapy WFL for tasks assessed/performed              PCP: Eulas Post, MD Referring Provier: Marcial Pacas, MD  Referring Diagnosis: R26.9 (ICD-10-CM) - Gait abnormality M35.9,G63 (ICD-10-CM) - 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 ([place] Visit Diagnosis: Muscle weakness (generalized)  Other abnormalities of gait and mobility  Past Medical History:  Diagnosis Date   Allergic rhinitis, cause unspecified    Allergy    Anxiety state, unspecified    Blood transfusion without reported diagnosis    2020   Cataract    removed both eyes    Chronic kidney disease    Disorder of bone and cartilage, unspecified    Esophageal candidiasis (HCC)    Foot drop    Gastric ulcer    History of CMV    History of Helicobacter pylori infection    Hypothyroid    Irritable bowel syndrome    Lumbago    Microscopic polyangiitis (HCC)    Other and unspecified hyperlipidemia    Perforation of tympanic membrane, unspecified    Unspecified essential hypertension    Past Surgical History:  Procedure Laterality Date   ABDOMINAL HYSTERECTOMY     BIOPSY  01/02/2019   Procedure: BIOPSY;  Surgeon: Yetta Flock, MD;  Location: [facility] ENDOSCOPY;  Service: Gastroenterology;;   cataract surgery  5/12 and 12/24/2012    both eyes   COLONOSCOPY     COLONOSCOPY WITH PROPOFOL N/A 01/02/2019   Procedure: COLONOSCOPY WITH PROPOFOL;  Surgeon: Yetta Flock, MD;  Location: [place];  Service: Gastroenterology;  Laterality: N/A;   ESOPHAGOGASTRODUODENOSCOPY (EGD) WITH PROPOFOL N/A 01/02/2019   Procedure: ESOPHAGOGASTRODUODENOSCOPY (EGD) WITH PROPOFOL;  Surgeon: Yetta Flock, MD;  Location: [place];  Service: Gastroenterology;  Laterality: N/A;   NSVD     x1   POLYPECTOMY  01/02/2019   Procedure: POLYPECTOMY;  Surgeon: Yetta Flock, MD;  Location: [facility] ENDOSCOPY;  Service: Gastroenterology;;   POLYPECTOMY     TONSILLECTOMY     UPPER GASTROINTESTINAL ENDOSCOPY     Patient Active Problem List   Diagnosis Date Noted   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 ([place]) 09/25/2020   Peripheral neuropathy 05/08/2020   Weakness 03/28/2020   Gait abnormality 03/28/2020   Paresthesia 03/28/2020   Left foot drop 03/23/2020   Heme positive stool    Benign neoplasm of colon    Acute blood loss anemia 12/30/2018   Symptomatic anemia 12/15/2018   AKI (acute kidney injury) ([place]) 12/15/2018   Hypomagnesemia 12/15/2018   Steroid-induced hyperglycemia 12/15/2018   Microscopic polyangiitis ([place]) 11/13/2018   Positive P-ANCA titer 10/14/2018   Viral URI 05/12/2018   Syncope 06/05/2016   Unspecified venous (  peripheral) insufficiency 11/23/2012   Dermatitis 11/10/2012   Hypothyroidism 05/22/2011   TYMPANIC MEMBRANE PERFORATION, LEFT EAR 08/08/2007   Osteopenia 07/27/2007   Hyperlipidemia 05/11/2007   Anxiety state 05/11/2007   Essential hypertension 05/11/2007   ALLERGIC RHINITIS 05/11/2007   Irritable bowel syndrome 05/11/2007   LOW BACK PAIN 05/11/2007   HYSTERECTOMY, HX OF 05/11/2007    SUBJECTIVE: She has practiced with cane at her countertop with head turns.  Pain: is patient experiencing pain? No.    OBJECTIVE:  Modified CTSIB: Test 1: 30 sec Test 2: 30 sec Test 3: 30  sec Test 4: 10 sec Total: 100 sec/120 sec  6 MWT (01/05/21): 510 feet with st. Cane Gait speed: 10 meter walk test: 0.48ms with st. Cane (01/05/21) Modified CTSIB (02/09/21) 100 sec  Gait speed: 10 meter walk test: 0.59 m/s (02/09/21)  Today's Treatment:  02/09/21: Gait training: 690 feet with fast and normal walking Walking backwards: 5 x 10 feet Turning 360 deg 5x cw, 5x ccw Farmer's carry: uni 10lbs 115' R and L each Deadlift: 25 lbs KB from floor: 10x Split lunge with use of bil HHA mat table: 5x R and L Practiced tall kneeling to 1/2 kneeling with mat in front of her: 10x R and L SLS: 10 x 5" holds R and L  02/02/21: Gait training: - walking up hill on grass: 50 feet, down hill on grass: 50 feet, walking through 4 feet x 2 of pine needles, walking up and down grass curb step, with st[facility]ane, CGA  - Walking on level sidewalk: 1050 feet with st. Cane SBA Straddle step up and down: laterall: 10x on airex with intermittent counter support Gait training: no AD: 115' total fwd, frequent changing of direction by PT while walking fwd, bwd, lateral to R and L Stepping over 2 inch tall and 4" wide foam beams: step to pattern, 4 foam beams: 2 x leading with R leg, 2x leading with L leg  01/31/21: Obstacle course x 5: total distance of loop: 220', including 2 cones for figure 8 walking, 2 hurdles (1 x 6" and 1 x 8", walking on balance beam, walking with horizontal and vertical head turns, fast walking, ramp up and down, curb step up and down, 4 steps with cane and one rail.  01/26/21: Gait training: L hand on counter and R hand with cane: pt walks fwd and bwd with horizontal head turns: 5 x 10 feet Step ladder:  Reciprocating steps: 4 x 10 feet fwd with st. Cane Lateral stepping: 1 x 10 feet R and L, with cane In in out out: lateral: 1 x 10 feet R and L with cane Gait training: 1 x 850' total (of 425' on grass and 425' on sidewalk), pt needed min A x 3 due to LOB when walking on  grass 01/24/21 Soft tissue work to [employer] achilles tendon, plantar fascia, posterior tib Manually stretched ankle into dorsiflexion, flexor hallucis longus on L LE Gait training: 1 x 850' total (of 425' on grass and 425' on sidewalk), pt needed min A x 3 due to LOB when walking on grass Gait training: L hand on counter and R hand with cane: pt walks fwd and bwd with horizontal head turns: 3x 10 feet, vertical head turns: 3 x 10 feet- husband educated to be next to her when she is doing this at home. Both of them educated on practicing this once a day for   Patient Education:  Education details: Pt educated  on starting to use cane at home. Educated husband to stand next to her for safety for first 1-2 weeks until she is more confident. Educated pt to slow down and taking her time when walking with cane. Pt educated to wait 5-10 seconds when standing before she starts stepping to make sure she has good balance.  Person educated: Patient and Spouse Education method: Explanation Education comprehension: verbalized understanding     Home Exercise Program: Access Code: [email or web address] URL: [email or web address] Date: 10/13/2020 Prepared by: Markus Jarvis  Exercises Straight Leg Raise - 1 x daily - 7 x weekly - 2 sets - 10 reps Supine Bridge - 1 x daily - 7 x weekly - 2 sets - 10 reps Sidelying Hip Abduction - 1 x daily - 7 x weekly - 2 sets - 10 reps Seated Knee Extension with Resistance - 1 x daily - 7 x weekly - 2 sets - 10 reps Seated Hamstring Curl with Anchored Resistance - 1 x daily - 7 x weekly - 2 sets - 10 reps    Assessment: Clinical impression:  Patient has been seen for total of 26 sessions for gait and mobility disorder. Patient has progressed well. Currently pt is using st. Cane for in home mobility and rolling walker for community mobility. Patient has demonstrated significant progress towards her long term functional goals. Patient will continue to benefit  from skilled PT to improve static and dynamic balance on non compliant surfaces, improve single leg balance and progressing gait with LRAD.   Rehab potential: Good   Clinical decision making: Stable/uncomplicated   Evaluation complexity: Moderate     Goals: Goals reviewed with patient? Yes   SHORT TERM GOALS:   STG Name Target Date Goal status  1 Pt will be be able to ambulate 1200' with RW to improve walking endurance Comments: Eval = 693' with RW; 1527' with RW (11/22/20) 11/03/20 Goal met   2 Pt will demo compliance with HEP to improve self management of symptoms. Comments: Eval = not issued; compliant 11/03/20 Goal met  3 Pt will get a shower chair and be able to sit and pivot into tub with min A from her husband. Got shower chair on 11/21/20 11/03/20 Goal met    LONG TERM GOALS:    LTG Name Target Date Goal status  1 Pt will be able to stand up from chair without HHA and without bracing knees against chair/bed to improve strength in bil LE Comments: Eval = needs to brace knees against chair to stand up; able to stand up but rquires CGA; able to stand without bracing knees (12/15/20) 12/01/20 Goal met 12/15/20  2 Pt will be able to ambulate >2000' with RW and without rest break to improve walking endurance Comments: Eval = 693' with RW;11/22/20 1527; 12/01/20 Goal met 12/15/20  3 Pt will be able to ambulate at least 800' with LRAD and without rest break to improve functional ambulation Comments: Eval = 693' with RW without rest break, 874' with st. Cane with CGA, 1175' with st. Cane with CGA (12/13/20); 1050' with st cane and CGA (12/15/20); 1050' with st. Cane and SBA/CGA; 1050' with st. Cane SBA 02/09/21   Goal met  02/09/21  4 Pt will be able to don/doff her shoes/braces off at EOB to improve indepence. Comments: Eval =  Husband helps her with getting her shoes and braces and puts them on for her. Pt able to don/doff shoes but due to short legs her legs dangle and pt  doesn't feel comfortable.  02/09/21 Not met  5.  Pt will demo 120/120 sec on Modified CTSIB to improve standing balance on uneven surfaces 100/120 sec (02/09/21) 04/06/21 (revised 02/09/21) Progressing, continue 02/09/21  6.  Pt will be able to stand on SLS for at least 8 seconds to improve curb negotiations  3 sec bil (02/09/21) 04/06/21 (added 02/09/21) Progressing continue    PLAN:   PT frequency: 1x/week   PT duration: 6 sessions or 8 weeks   Planned Interventions: Therapeutic exercises, Therapeutic activity, Neuro Muscular re-education, Balance training, Gait training, Patient/Family education, Joint mobilization, Stair training, Orthotic/Fit training, Cryotherapy and Moist heat   Plan for next session: Work on SLS, non compliant surface EC   Kerrie Pleasure, PT 02/09/2021, [facility] [address] [place], [place], [address] Phone: [phone]   Fax:  [phone]  Patient name: KAYRON KALMAR MRN: [id number] DOB: May 07, 1933

## 2021-02-12 ENCOUNTER — Ambulatory Visit: Payer: Medicare Other

## 2021-02-12 ENCOUNTER — Other Ambulatory Visit: Payer: Self-pay

## 2021-02-12 DIAGNOSIS — R2689 Other abnormalities of gait and mobility: Secondary | ICD-10-CM

## 2021-02-12 DIAGNOSIS — M6281 Muscle weakness (generalized): Secondary | ICD-10-CM

## 2021-02-12 NOTE — Therapy (Signed)
Outpatient Physical Therapy Note  Patient Name: Emily Cannon MRN: [id number] DOB:1933/06/23, 87 y.o., female Today's Date: 02/12/2021   PT End of Session - 02/12/21 1022     Visit Number 27    Number of Visits 32    Date for PT Re-Evaluation 04/06/21    Authorization Type 10 v PN on 11/22/20; Recert on 04/13/93-11/29/86 (2x/week for 12 sessions), 10 v PN on 03/26/99; recert 3/49/17 to 03/29/49 (1x/week for 6 sessions)    Progress Note Due on Visit 32    Equipment Utilized During Treatment Gait belt    Activity Tolerance Patient tolerated treatment well    Behavior During Therapy WFL for tasks assessed/performed              PCP: Eulas Post, MD Referring Provier: Eulas Post, MD  Referring Diagnosis: R26.9 (ICD-10-CM) - Gait abnormality M35.9,G63 (ICD-10-CM) - 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 ([place] Visit Diagnosis: Muscle weakness (generalized)  Other abnormalities of gait and mobility  Past Medical History:  Diagnosis Date   Allergic rhinitis, cause unspecified    Allergy    Anxiety state, unspecified    Blood transfusion without reported diagnosis    2020   Cataract    removed both eyes    Chronic kidney disease    Disorder of bone and cartilage, unspecified    Esophageal candidiasis (HCC)    Foot drop    Gastric ulcer    History of CMV    History of Helicobacter pylori infection    Hypothyroid    Irritable bowel syndrome    Lumbago    Microscopic polyangiitis ([place])    Other and unspecified hyperlipidemia    Perforation of tympanic membrane, unspecified    Unspecified essential hypertension    Past Surgical History:  Procedure Laterality Date   ABDOMINAL HYSTERECTOMY     BIOPSY  01/02/2019   Procedure: BIOPSY;  Surgeon: Yetta Flock, MD;  Location: [place];  Service: Gastroenterology;;   cataract surgery  5/12 and 12/24/2012   both eyes   COLONOSCOPY     COLONOSCOPY WITH PROPOFOL N/A 01/02/2019   Procedure:  COLONOSCOPY WITH PROPOFOL;  Surgeon: Yetta Flock, MD;  Location: [place];  Service: Gastroenterology;  Laterality: N/A;   ESOPHAGOGASTRODUODENOSCOPY (EGD) WITH PROPOFOL N/A 01/02/2019   Procedure: ESOPHAGOGASTRODUODENOSCOPY (EGD) WITH PROPOFOL;  Surgeon: Yetta Flock, MD;  Location: [place];  Service: Gastroenterology;  Laterality: N/A;   NSVD     x1   POLYPECTOMY  01/02/2019   Procedure: POLYPECTOMY;  Surgeon: Yetta Flock, MD;  Location: [facility] ENDOSCOPY;  Service: Gastroenterology;;   POLYPECTOMY     TONSILLECTOMY     UPPER GASTROINTESTINAL ENDOSCOPY     Patient Active Problem List   Diagnosis Date Noted   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 ([place]) 09/25/2020   Peripheral neuropathy 05/08/2020   Weakness 03/28/2020   Gait abnormality 03/28/2020   Paresthesia 03/28/2020   Left foot drop 03/23/2020   Heme positive stool    Benign neoplasm of colon    Acute blood loss anemia 12/30/2018   Symptomatic anemia 12/15/2018   AKI (acute kidney injury) ([place]) 12/15/2018   Hypomagnesemia 12/15/2018   Steroid-induced hyperglycemia 12/15/2018   Microscopic polyangiitis ([place]) 11/13/2018   Positive P-ANCA titer 10/14/2018   Viral URI 05/12/2018   Syncope 06/05/2016   Unspecified venous (peripheral) insufficiency 11/23/2012   Dermatitis 11/10/2012   Hypothyroidism 05/22/2011   TYMPANIC MEMBRANE PERFORATION, LEFT EAR 08/08/2007   Osteopenia  07/27/2007   Hyperlipidemia 05/11/2007   Anxiety state 05/11/2007   Essential hypertension 05/11/2007   ALLERGIC RHINITIS 05/11/2007   Irritable bowel syndrome 05/11/2007   LOW BACK PAIN 05/11/2007   HYSTERECTOMY, HX OF 05/11/2007    SUBJECTIVE: She came in with st[facility] Cane from her car today  Pain: is patient experiencing pain? No.    OBJECTIVE:  Modified CTSIB: Test 1: 30 sec Test 2: 30 sec Test 3: 30 sec Test 4: 10 sec Total: 100 sec/120 sec  6 MWT (01/05/21): 510 feet with [facility]. Cane Gait speed:  10 meter walk test: 0.41ms with st. Cane (01/05/21) Modified CTSIB (02/09/21) 100 sec  Gait speed: 10 meter walk test: 0.59 m/s (02/09/21)  Today's Treatment:  02/12/21: Standing on foam: EO: 2 x 1' with normal BOS Normal BOS on foam: EC: 2 x 1' Stepping up and down from airex: 4x R and L CGA to min A 360 deg turns: 5x cw, 5x ccw: cues for turning with shoulders to look where she is steping SLS: 5x R and L, CGA to min A SLS on floor: 3 x 30" R and L with 2 fingers from each hand UE support- educated pt and husband to practice this at home verbally (internet down in clinic at the time of session) Picking small objects from floor: put in different directions where pt had to take a step or turn to pick it up; Gait training: 1 x 460' without Ad with min A due to 4 near falls due to catching of R shoe. 02/09/21: Gait training: 690 feet with fast and normal walking Walking backwards: 5 x 10 feet Turning 360 deg 5x cw, 5x ccw Farmer's carry: uni 10lbs 115' R and L each Deadlift: 25 lbs KB from floor: 10x Split lunge with use of bil HHA mat table: 5x R and L Practiced tall kneeling to 1/2 kneeling with mat in front of her: 10x R and L SLS: 10 x 5" holds R and L  02/02/21: Gait training: - walking up hill on grass: 50 feet, down hill on grass: 50 feet, walking through 4 feet x 2 of pine needles, walking up and down grass curb step, with [facility]ane, CGA  - Walking on level sidewalk: 1050 feet with [facility]. Cane SBA Straddle step up and down: laterall: 10x on airex with intermittent counter support Gait training: no AD: 115' total fwd, frequent changing of direction by PT while walking fwd, bwd, lateral to R and L Stepping over 2 inch tall and 4" wide foam beams: step to pattern, 4 foam beams: 2 x leading with R leg, 2x leading with L leg  01/31/21: Obstacle course x 5: total distance of loop: 220', including 2 cones for figure 8 walking, 2 hurdles (1 x 6" and 1 x 8", walking on balance beam, walking with  horizontal and vertical head turns, fast walking, ramp up and down, curb step up and down, 4 steps with cane and one rail.  01/26/21: Gait training: L hand on counter and R hand with cane: pt walks fwd and bwd with horizontal head turns: 5 x 10 feet Step ladder:  Reciprocating steps: 4 x 10 feet fwd with [facility]. Cane Lateral stepping: 1 x 10 feet R and L, with cane In in out out: lateral: 1 x 10 feet R and L with cane Gait training: 1 x 850' total (of 425' on grass and 425' on sidewalk), pt needed min A x 3 due to LOB when walking on  grass 01/24/21 Soft tissue work to [employer], achilles tendon, plantar fascia, posterior tib Manually stretched ankle into dorsiflexion, flexor hallucis longus on L LE Gait training: 1 x 850' total (of 425' on grass and 425' on sidewalk), pt needed min A x 3 due to LOB when walking on grass Gait training: L hand on counter and R hand with cane: pt walks fwd and bwd with horizontal head turns: 3x 10 feet, vertical head turns: 3 x 10 feet- husband educated to be next to her when she is doing this at home. Both of them educated on practicing this once a day for   Patient Education:  Education details: Pt educated on starting to use cane at home. Educated husband to stand next to her for safety for first 1-2 weeks until she is more confident. Educated pt to slow down and taking her time when walking with cane. Pt educated to wait 5-10 seconds when standing before she starts stepping to make sure she has good balance.  Person educated: Patient and Spouse Education method: Explanation Education comprehension: verbalized understanding     Home Exercise Program: Access Code: [email or web address] URL: [email or web address] Date: 10/13/2020 Prepared by: Markus Jarvis  Exercises Straight Leg Raise - 1 x daily - 7 x weekly - 2 sets - 10 reps Supine Bridge - 1 x daily - 7 x weekly - 2 sets - 10 reps Sidelying Hip Abduction - 1 x daily - 7 x weekly - 2 sets - 10  reps Seated Knee Extension with Resistance - 1 x daily - 7 x weekly - 2 sets - 10 reps Seated Hamstring Curl with Anchored Resistance - 1 x daily - 7 x weekly - 2 sets - 10 reps    Assessment: Clinical impression:  Pt toleated session well. During gait, pt had 4 stumbles which required PT's assistance. Pt was asked to pick up her knees. This has not happened a lot before. Pt was educated to hold on to her husband and use her cane as she went out.   Rehab potential: Good   Clinical decision making: Stable/uncomplicated   Evaluation complexity: Moderate     Goals: Goals reviewed with patient? Yes   SHORT TERM GOALS:   STG Name Target Date Goal status  1 Pt will be be able to ambulate 1200' with RW to improve walking endurance Comments: Eval = 693' with RW; 1527' with RW (11/22/20) 11/03/20 Goal met   2 Pt will demo compliance with HEP to improve self management of symptoms. Comments: Eval = not issued; compliant 11/03/20 Goal met  3 Pt will get a shower chair and be able to sit and pivot into tub with min A from her husband. Got shower chair on 11/21/20 11/03/20 Goal met    LONG TERM GOALS:    LTG Name Target Date Goal status  1 Pt will be able to stand up from chair without HHA and without bracing knees against chair/bed to improve strength in bil LE Comments: Eval = needs to brace knees against chair to stand up; able to stand up but rquires CGA; able to stand without bracing knees (12/15/20) 12/01/20 Goal met 12/15/20  2 Pt will be able to ambulate >2000' with RW and without rest break to improve walking endurance Comments: Eval = 693' with RW;11/22/20 1527; 12/01/20 Goal met 12/15/20  3 Pt will be able to ambulate at least 800' with LRAD and without rest break to improve functional ambulation Comments: Eval = 693' with RW without  rest break, 94' with st. Cane with CGA, 1175' with st. Cane with CGA (12/13/20); 1050' with st cane and CGA (12/15/20); 1050' with st. Cane and SBA/CGA; 1050' with st.  Cane SBA 02/09/21   Goal met  02/09/21  4 Pt will be able to don/doff her shoes/braces off at EOB to improve indepence. Comments: Eval =  Husband helps her with getting her shoes and braces and puts them on for her. Pt able to don/doff shoes but due to short legs her legs dangle and pt doesn't feel comfortable. 02/09/21 Not met  5.  Pt will demo 120/120 sec on Modified CTSIB to improve standing balance on uneven surfaces 100/120 sec (02/09/21) 04/06/21 (revised 02/09/21) Progressing, continue 02/09/21  6.  Pt will be able to stand on SLS for at least 8 seconds to improve curb negotiations  3 sec bil (02/09/21) 04/06/21 (added 02/09/21) Progressing continue    PLAN:   PT frequency: 1x/week   PT duration: 6 sessions or 8 weeks   Planned Interventions: Therapeutic exercises, Therapeutic activity, Neuro Muscular re-education, Balance training, Gait training, Patient/Family education, Joint mobilization, Stair training, Orthotic/Fit training, Cryotherapy and Moist heat   Plan for next session: Work on SLS, non compliant surface EC   Kerrie Pleasure, PT 02/12/2021, [facility] [address] [place], [place], [address] Phone: [phone]   Fax:  [phone]  Patient name: Emily Cannon MRN: [id number] DOB: 1933/04/10

## 2021-02-14 ENCOUNTER — Other Ambulatory Visit: Payer: Self-pay | Admitting: Family Medicine

## 2021-02-14 DIAGNOSIS — I10 Essential (primary) hypertension: Secondary | ICD-10-CM

## 2021-02-21 ENCOUNTER — Ambulatory Visit: Payer: Medicare Other

## 2021-02-21 ENCOUNTER — Other Ambulatory Visit: Payer: Self-pay

## 2021-02-21 DIAGNOSIS — R2689 Other abnormalities of gait and mobility: Secondary | ICD-10-CM

## 2021-02-21 DIAGNOSIS — M6281 Muscle weakness (generalized): Secondary | ICD-10-CM

## 2021-02-21 NOTE — Therapy (Signed)
Outpatient Physical Therapy Note  Patient Name: Emily Cannon MRN: [id number] DOB:04/29/1933, 87 y.o., female Today's Date: 02/21/2021   PT End of Session - 02/21/21 1029     Visit Number 28    Number of Visits 32    Date for PT Re-Evaluation 04/06/21    Authorization Type 10 v PN on 11/22/20; Recert on 0/73/71-0/62/69 (2x/week for 12 sessions), 10 v PN on 4/85/46; recert 2/70/35 to 0/0/93 (1x/week for 6 sessions)    Progress Note Due on Visit 32    PT Start Time 0845    PT Stop Time 0930    PT Time Calculation (min) 45 min    Equipment Utilized During Treatment Gait belt    Activity Tolerance Patient tolerated treatment well    Behavior During Therapy WFL for tasks assessed/performed              PCP: Eulas Post, MD Referring Provier: Marcial Pacas, MD  Referring Diagnosis: R26.9 (ICD-10-CM) - Gait abnormality M35.9,G63 (ICD-10-CM) - 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 ([place] Visit Diagnosis: Muscle weakness (generalized)  Other abnormalities of gait and mobility  Past Medical History:  Diagnosis Date   Allergic rhinitis, cause unspecified    Allergy    Anxiety state, unspecified    Blood transfusion without reported diagnosis    2020   Cataract    removed both eyes    Chronic kidney disease    Disorder of bone and cartilage, unspecified    Esophageal candidiasis (HCC)    Foot drop    Gastric ulcer    History of CMV    History of Helicobacter pylori infection    Hypothyroid    Irritable bowel syndrome    Lumbago    Microscopic polyangiitis (HCC)    Other and unspecified hyperlipidemia    Perforation of tympanic membrane, unspecified    Unspecified essential hypertension    Past Surgical History:  Procedure Laterality Date   ABDOMINAL HYSTERECTOMY     BIOPSY  01/02/2019   Procedure: BIOPSY;  Surgeon: Yetta Flock, MD;  Location: [facility] ENDOSCOPY;  Service: Gastroenterology;;   cataract surgery  5/12 and 12/24/2012   both eyes    COLONOSCOPY     COLONOSCOPY WITH PROPOFOL N/A 01/02/2019   Procedure: COLONOSCOPY WITH PROPOFOL;  Surgeon: Yetta Flock, MD;  Location: [place];  Service: Gastroenterology;  Laterality: N/A;   ESOPHAGOGASTRODUODENOSCOPY (EGD) WITH PROPOFOL N/A 01/02/2019   Procedure: ESOPHAGOGASTRODUODENOSCOPY (EGD) WITH PROPOFOL;  Surgeon: Yetta Flock, MD;  Location: [place];  Service: Gastroenterology;  Laterality: N/A;   NSVD     x1   POLYPECTOMY  01/02/2019   Procedure: POLYPECTOMY;  Surgeon: Yetta Flock, MD;  Location: [facility] ENDOSCOPY;  Service: Gastroenterology;;   POLYPECTOMY     TONSILLECTOMY     UPPER GASTROINTESTINAL ENDOSCOPY     Patient Active Problem List   Diagnosis Date Noted   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 ([place]) 09/25/2020   Peripheral neuropathy 05/08/2020   Weakness 03/28/2020   Gait abnormality 03/28/2020   Paresthesia 03/28/2020   Left foot drop 03/23/2020   Heme positive stool    Benign neoplasm of colon    Acute blood loss anemia 12/30/2018   Symptomatic anemia 12/15/2018   AKI (acute kidney injury) ([place]) 12/15/2018   Hypomagnesemia 12/15/2018   Steroid-induced hyperglycemia 12/15/2018   Microscopic polyangiitis ([place]) 11/13/2018   Positive P-ANCA titer 10/14/2018   Viral URI 05/12/2018   Syncope 06/05/2016   Unspecified venous (  peripheral) insufficiency 11/23/2012   Dermatitis 11/10/2012   Hypothyroidism 05/22/2011   TYMPANIC MEMBRANE PERFORATION, LEFT EAR 08/08/2007   Osteopenia 07/27/2007   Hyperlipidemia 05/11/2007   Anxiety state 05/11/2007   Essential hypertension 05/11/2007   ALLERGIC RHINITIS 05/11/2007   Irritable bowel syndrome 05/11/2007   LOW BACK PAIN 05/11/2007   HYSTERECTOMY, HX OF 05/11/2007    SUBJECTIVE: Pt reports she had to stop one of her medication for 3 weeks as her copay increased from $45 to $800. She thinks because of that medication, she was stumbling more last session and she has also  noticed that R leg catches floor more at home.  Pain: is patient experiencing pain? No.    OBJECTIVE:  Modified CTSIB: Test 1: 30 sec Test 2: 30 sec Test 3: 30 sec Test 4: 10 sec Total: 100 sec/120 sec  6 MWT (01/05/21): 510 feet with st. Cane Gait speed: 10 meter walk test: 0.76ms with st. Cane (01/05/21) Modified CTSIB (02/09/21) 100 sec  Gait speed: 10 meter walk test: 0.59 m/s (02/09/21)  Today's Treatment:  02/21/21: Soft tissue mobilization to bil gastroc/soleus, posterior tib group Manually stretched bil gastroc/soleus Grade III mobilization with sublatarl distraction on L LE Grade III-IV mobilization of eversion tilt of rear foot Gait training: 1 x 460 feet with st. Cane, one stumble where pt required mod A to regain balance, verbal cues throughout gait to pick up knees more to prevent R foot from catching. Tested bil hip flexors: 4+/5 bil  02/12/21: Standing on foam: EO: 2 x 1' with normal BOS Normal BOS on foam: EC: 2 x 1' Stepping up and down from airex: 4x R and L CGA to min A 360 deg turns: 5x cw, 5x ccw: cues for turning with shoulders to look where she is steping SLS: 5x R and L, CGA to min A SLS on floor: 3 x 30" R and L with 2 fingers from each hand UE support- educated pt and husband to practice this at home verbally (internet down in clinic at the time of session) Picking small objects from floor: put in different directions where pt had to take a step or turn to pick it up; Gait training: 1 x 460' without Ad with min A due to 4 near falls due to catching of   Patient Education:  Education details: Pt educated to use rollator at home if she feels like she is stubling her R foot at home to improve safety and reduce fall risk  Person educated: Patient and Spouse Education method: Explanation Education comprehension: verbalized understanding     Home Exercise Program: Access Code: [email or web address]: [email or web address] Date: 10/13/2020 Prepared  by: KMarkus Jarvis Exercises Straight Leg Raise - 1 x daily - 7 x weekly - 2 sets - 10 reps Supine Bridge - 1 x daily - 7 x weekly - 2 sets - 10 reps Sidelying Hip Abduction - 1 x daily - 7 x weekly - 2 sets - 10 reps Seated Knee Extension with Resistance - 1 x daily - 7 x weekly - 2 sets - 10 reps Seated Hamstring Curl with Anchored Resistance - 1 x daily - 7 x weekly - 2 sets - 10 reps    Assessment: Clinical impression:  Pt is definitely demonstrating increasing R foot catching in last 2 sessions compared to previous sessions due to medication changes. Patient demonstrated more neutral footing in [place] after manual therapy today.    Rehab potential: Good   Clinical decision making: Stable/uncomplicated  Evaluation complexity: Moderate     Goals: Goals reviewed with patient? Yes   SHORT TERM GOALS:   STG Name Target Date Goal status  1 Pt will be be able to ambulate 1200' with RW to improve walking endurance Comments: Eval = 693' with RW; 1527' with RW (11/22/20) 11/03/20 Goal met   2 Pt will demo compliance with HEP to improve self management of symptoms. Comments: Eval = not issued; compliant 11/03/20 Goal met  3 Pt will get a shower chair and be able to sit and pivot into tub with min A from her husband. Got shower chair on 11/21/20 11/03/20 Goal met    LONG TERM GOALS:    LTG Name Target Date Goal status  1 Pt will be able to stand up from chair without HHA and without bracing knees against chair/bed to improve strength in bil LE Comments: Eval = needs to brace knees against chair to stand up; able to stand up but rquires CGA; able to stand without bracing knees (12/15/20) 12/01/20 Goal met 12/15/20  2 Pt will be able to ambulate >2000' with RW and without rest break to improve walking endurance Comments: Eval = 693' with RW;11/22/20 1527; 12/01/20 Goal met 12/15/20  3 Pt will be able to ambulate at least 800' with LRAD and without rest break to improve functional ambulation Comments:  Eval = 693' with RW without rest break, 874' with st. Cane with CGA, 1175' with st. Cane with CGA (12/13/20); 1050' with st cane and CGA (12/15/20); 1050' with st. Cane and SBA/CGA; 1050' with st. Cane SBA 02/09/21   Goal met  02/09/21  4 Pt will be able to don/doff her shoes/braces off at EOB to improve indepence. Comments: Eval =  Husband helps her with getting her shoes and braces and puts them on for her. Pt able to don/doff shoes but due to short legs her legs dangle and pt doesn't feel comfortable. 02/09/21 Not met  5.  Pt will demo 120/120 sec on Modified CTSIB to improve standing balance on uneven surfaces 100/120 sec (02/09/21) 04/06/21 (revised 02/09/21) Progressing, continue 02/09/21  6.  Pt will be able to stand on SLS for at least 8 seconds to improve curb negotiations  3 sec bil (02/09/21) 04/06/21 (added 02/09/21) Progressing continue    PLAN:   PT frequency: 1x/week   PT duration: 6 sessions or 8 weeks   Planned Interventions: Therapeutic exercises, Therapeutic activity, Neuro Muscular re-education, Balance training, Gait training, Patient/Family education, Joint mobilization, Stair training, Orthotic/Fit training, Cryotherapy and Moist heat   Plan for next session: Work on SLS, non compliant surface EC   Kerrie Pleasure, PT 02/21/2021, [facility] [address] [place], [place], [address] Phone: [phone]   Fax:  [phone]  Patient name: CHLOE BLUETT MRN: [id number] DOB: 1933/03/19

## 2021-03-07 ENCOUNTER — Ambulatory Visit: Payer: Medicare Other | Attending: Neurology

## 2021-03-07 ENCOUNTER — Other Ambulatory Visit: Payer: Self-pay

## 2021-03-07 DIAGNOSIS — R2689 Other abnormalities of gait and mobility: Secondary | ICD-10-CM | POA: Diagnosis present

## 2021-03-07 DIAGNOSIS — M6281 Muscle weakness (generalized): Secondary | ICD-10-CM | POA: Insufficient documentation

## 2021-03-07 NOTE — Patient Instructions (Signed)
Daily routines:  Practice walking with cane in hallway: Practice walking forwards and backwards. Length of hallway x 5  Practice sit to stand from a sturdy chair. Do not use your hands. Do not push your knees back against the chair. 15 x  Stand with your feet shoulder width apart. Turn your head and body to look over your Right shoulder and then Left shoulder. 15 x

## 2021-03-07 NOTE — Therapy (Signed)
Outpatient Physical Therapy Note  Patient Name: Emily Cannon MRN: [id number] DOB:1932-08-25, 87 y.o., female Today's Date: 03/07/2021   PT End of Session - 03/07/21 1452     Visit Number 29    Number of Visits 32    Date for PT Re-Evaluation 04/06/21    Authorization Type 10 v PN on 11/22/20; Recert on 03/31/82-3/38/32 (2x/week for 12 sessions), 10 v PN on 04/17/15; recert 01/02/99 to 10/31/97 (1x/week for 6 sessions)    Progress Note Due on Visit 32    PT Start Time 1445    PT Stop Time 1530    PT Time Calculation (min) 45 min    Equipment Utilized During Treatment Gait belt    Activity Tolerance Patient tolerated treatment well    Behavior During Therapy WFL for tasks assessed/performed              PCP: Eulas Post, MD Referring Provier: Eulas Post, MD  Referring Diagnosis: R26.9 (ICD-10-CM) - Gait abnormality M35.9,G63 (ICD-10-CM) - 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 ([place] Visit Diagnosis: Muscle weakness (generalized)  Other abnormalities of gait and mobility  Past Medical History:  Diagnosis Date   Allergic rhinitis, cause unspecified    Allergy    Anxiety state, unspecified    Blood transfusion without reported diagnosis    2020   Cataract    removed both eyes    Chronic kidney disease    Disorder of bone and cartilage, unspecified    Esophageal candidiasis (HCC)    Foot drop    Gastric ulcer    History of CMV    History of Helicobacter pylori infection    Hypothyroid    Irritable bowel syndrome    Lumbago    Microscopic polyangiitis ([place])    Other and unspecified hyperlipidemia    Perforation of tympanic membrane, unspecified    Unspecified essential hypertension    Past Surgical History:  Procedure Laterality Date   ABDOMINAL HYSTERECTOMY     BIOPSY  01/02/2019   Procedure: BIOPSY;  Surgeon: Yetta Flock, MD;  Location: [facility] ENDOSCOPY;  Service: Gastroenterology;;   cataract surgery  5/12 and 12/24/2012    both eyes   COLONOSCOPY     COLONOSCOPY WITH PROPOFOL N/A 01/02/2019   Procedure: COLONOSCOPY WITH PROPOFOL;  Surgeon: Yetta Flock, MD;  Location: [facility] ENDOSCOPY;  Service: Gastroenterology;  Laterality: N/A;   ESOPHAGOGASTRODUODENOSCOPY (EGD) WITH PROPOFOL N/A 01/02/2019   Procedure: ESOPHAGOGASTRODUODENOSCOPY (EGD) WITH PROPOFOL;  Surgeon: Yetta Flock, MD;  Location: [place];  Service: Gastroenterology;  Laterality: N/A;   NSVD     x1   POLYPECTOMY  01/02/2019   Procedure: POLYPECTOMY;  Surgeon: Yetta Flock, MD;  Location: [facility] ENDOSCOPY;  Service: Gastroenterology;;   POLYPECTOMY     TONSILLECTOMY     UPPER GASTROINTESTINAL ENDOSCOPY     Patient Active Problem List   Diagnosis Date Noted   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 ([place]) 09/25/2020   Peripheral neuropathy 05/08/2020   Weakness 03/28/2020   Gait abnormality 03/28/2020   Paresthesia 03/28/2020   Left foot drop 03/23/2020   Heme positive stool    Benign neoplasm of colon    Acute blood loss anemia 12/30/2018   Symptomatic anemia 12/15/2018   AKI (acute kidney injury) ([place]) 12/15/2018   Hypomagnesemia 12/15/2018   Steroid-induced hyperglycemia 12/15/2018   Microscopic polyangiitis ([place]) 11/13/2018   Positive P-ANCA titer 10/14/2018   Viral URI 05/12/2018   Syncope 06/05/2016   Unspecified  venous (peripheral) insufficiency 11/23/2012   Dermatitis 11/10/2012   Hypothyroidism 05/22/2011   TYMPANIC MEMBRANE PERFORATION, LEFT EAR 08/08/2007   Osteopenia 07/27/2007   Hyperlipidemia 05/11/2007   Anxiety state 05/11/2007   Essential hypertension 05/11/2007   ALLERGIC RHINITIS 05/11/2007   Irritable bowel syndrome 05/11/2007   LOW BACK PAIN 05/11/2007   HYSTERECTOMY, HX OF 05/11/2007    SUBJECTIVE: Has been back on medication for 1 week and R LE is feeling better. Has been walking with walker for 10-15 min at home and doing exercises.  Pain: is patient experiencing pain? No.     OBJECTIVE:  Modified CTSIB: Test 1: 30 sec Test 2: 30 sec Test 3: 30 sec Test 4: 10 sec Total: 100 sec/120 sec  6 MWT (01/05/21): 510 feet with st. Cane Gait speed: 10 meter walk test: 0.72ms with st. Cane (01/05/21) Modified CTSIB (02/09/21) 100 sec  Gait speed: 10 meter walk test: 0.59 m/s (02/09/21)  Today's Treatment:  03/07/21: Sit to stand from high mat table 10x Sit to stand from 14" box: 2 x 5 R and L Standing and turning head/body: 15x Box taps: 15x 6" box with st. Cane Fwd stepping: R and L 10x, moin A with stepping fwd with L, CGA with R LE   02/21/21: Soft tissue mobilization to bil gastroc/soleus, posterior tib group Manually stretched bil gastroc/soleus Grade III mobilization with sublatarl distraction on L LE Grade III-IV mobilization of eversion tilt of rear foot Gait training: 1 x 460 feet with st. Cane, one stumble where pt required mod A to regain balance, verbal cues throughout gait to pick up knees more to prevent R foot from catching. Tested bil hip flexors: 4+/5 bil  02/12/21: Standing on foam: EO: 2 x 1' with normal BOS Normal BOS on foam: EC: 2 x 1' Stepping up and down from airex: 4x R and L CGA to min A 360 deg turns: 5x cw, 5x ccw: cues for turning with shoulders to look where she is steping SLS: 5x R and L, CGA to min A SLS on floor: 3 x 30" R and L with 2 fingers from each hand UE support- educated pt and husband to practice this at home verbally (internet down in clinic at the time of session) Picking small objects from floor: put in different directions where pt had to take a step or turn to pick it up; Gait training: 1 x 460' without Ad with min A due to 4 near falls due to catching of   Patient Education:  Education details: Pt educated to use rollator at home if she feels like she is stubling her R foot at home to improve safety and reduce fall risk  Person educated: Patient and Spouse Education method: Explanation Education  comprehension: verbalized understanding     Home Exercise Program: Access Code: [email or web address]: [email or web address] Date: 10/13/2020 Prepared by: KMarkus Jarvis Exercises Straight Leg Raise - 1 x daily - 7 x weekly - 2 sets - 10 reps Supine Bridge - 1 x daily - 7 x weekly - 2 sets - 10 reps Sidelying Hip Abduction - 1 x daily - 7 x weekly - 2 sets - 10 reps Seated Knee Extension with Resistance - 1 x daily - 7 x weekly - 2 sets - 10 reps Seated Hamstring Curl with Anchored Resistance - 1 x daily - 7 x weekly - 2 sets - 10 reps    Assessment: Clinical impression:  After restarting her medications, her R leg is  feeling better and she is not stumbling with her R leg. She definitely is demonstrating decreased balance and confidence compared to before stopping her medication.  Rehab potential: Good   Clinical decision making: Stable/uncomplicated   Evaluation complexity: Moderate     Goals: Goals reviewed with patient? Yes   SHORT TERM GOALS:   STG Name Target Date Goal status  1 Pt will be be able to ambulate 1200' with RW to improve walking endurance Comments: Eval = 693' with RW; 1527' with RW (11/22/20) 11/03/20 Goal met   2 Pt will demo compliance with HEP to improve self management of symptoms. Comments: Eval = not issued; compliant 11/03/20 Goal met  3 Pt will get a shower chair and be able to sit and pivot into tub with min A from her husband. Got shower chair on 11/21/20 11/03/20 Goal met    LONG TERM GOALS:    LTG Name Target Date Goal status  1 Pt will be able to stand up from chair without HHA and without bracing knees against chair/bed to improve strength in bil LE Comments: Eval = needs to brace knees against chair to stand up; able to stand up but rquires CGA; able to stand without bracing knees (12/15/20) 12/01/20 Goal met 12/15/20  2 Pt will be able to ambulate >2000' with RW and without rest break to improve walking endurance Comments: Eval = 693' with  RW;11/22/20 1527; 12/01/20 Goal met 12/15/20  3 Pt will be able to ambulate at least 800' with LRAD and without rest break to improve functional ambulation Comments: Eval = 693' with RW without rest break, 874' with st. Cane with CGA, 1175' with st. Cane with CGA (12/13/20); 1050' with st cane and CGA (12/15/20); 1050' with st. Cane and SBA/CGA; 1050' with st. Cane SBA 02/09/21   Goal met  02/09/21  4 Pt will be able to don/doff her shoes/braces off at EOB to improve indepence. Comments: Eval =  Husband helps her with getting her shoes and braces and puts them on for her. Pt able to don/doff shoes but due to short legs her legs dangle and pt doesn't feel comfortable. 02/09/21 Not met  5.  Pt will demo 120/120 sec on Modified CTSIB to improve standing balance on uneven surfaces 100/120 sec (02/09/21) 04/06/21 (revised 02/09/21) Progressing, continue 02/09/21  6.  Pt will be able to stand on SLS for at least 8 seconds to improve curb negotiations  3 sec bil (02/09/21) 04/06/21 (added 02/09/21) Progressing continue    PLAN:   PT frequency: 1x/week   PT duration: 6 sessions or 8 weeks   Planned Interventions: Therapeutic exercises, Therapeutic activity, Neuro Muscular re-education, Balance training, Gait training, Patient/Family education, Joint mobilization, Stair training, Orthotic/Fit training, Cryotherapy and Moist heat   Plan for next session: Work on SLS, non compliant surface EC   Kerrie Pleasure, PT 03/07/2021, [facility] [address] [place], [place], [address] Phone: [phone]   Fax:  [phone]  Patient name: JAYDEN RUDGE MRN: [id number] DOB: [birth year]

## 2021-03-15 ENCOUNTER — Ambulatory Visit: Payer: Medicare Other

## 2021-03-16 ENCOUNTER — Ambulatory Visit: Payer: Medicare Other

## 2021-03-20 NOTE — Therapy (Signed)
Outpatient Physical Therapy Note  Patient Name: Emily Cannon MRN: [id number] DOB:Jul 27, 1933, 87 y.o., female Today's Date: 03/21/2021   PT End of Session - 03/21/21 1554     Visit Number 30    Number of Visits 32    Date for PT Re-Evaluation 04/06/21    Authorization Type 10 v PN on 11/22/20; Recert on 2/87/86-7/67/20 (2x/week for 12 sessions), 10 v PN on 9/47/09; recert 01/24/35 to 12/28/92 (1x/week for 6 sessions)    Progress Note Due on Visit 32    PT Start Time 1445    PT Stop Time 1530    PT Time Calculation (min) 45 min    Equipment Utilized During Treatment Gait belt    Activity Tolerance Patient tolerated treatment well    Behavior During Therapy WFL for tasks assessed/performed              PCP: Emily Post, MD Referring Provier: Emily Pacas, MD  Referring Diagnosis: R26.9 (ICD-10-CM) - Gait abnormality M35.9,G63 (ICD-10-CM) - 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 ([place] Visit Diagnosis: Muscle weakness (generalized)  Other abnormalities of gait and mobility  Past Medical History:  Diagnosis Date   Allergic rhinitis, cause unspecified    Allergy    Anxiety state, unspecified    Blood transfusion without reported diagnosis    2020   Cataract    removed both eyes    Chronic kidney disease    Disorder of bone and cartilage, unspecified    Esophageal candidiasis (HCC)    Foot drop    Gastric ulcer    History of CMV    History of Helicobacter pylori infection    Hypothyroid    Irritable bowel syndrome    Lumbago    Microscopic polyangiitis (HCC)    Other and unspecified hyperlipidemia    Perforation of tympanic membrane, unspecified    Unspecified essential hypertension    Past Surgical History:  Procedure Laterality Date   ABDOMINAL HYSTERECTOMY     BIOPSY  01/02/2019   Procedure: BIOPSY;  Surgeon: Emily Flock, MD;  Location: [facility] ENDOSCOPY;  Service: Gastroenterology;;   cataract surgery  5/12 and 12/24/2012   both eyes    COLONOSCOPY     COLONOSCOPY WITH PROPOFOL N/A 01/02/2019   Procedure: COLONOSCOPY WITH PROPOFOL;  Surgeon: Emily Flock, MD;  Location: [place];  Service: Gastroenterology;  Laterality: N/A;   ESOPHAGOGASTRODUODENOSCOPY (EGD) WITH PROPOFOL N/A 01/02/2019   Procedure: ESOPHAGOGASTRODUODENOSCOPY (EGD) WITH PROPOFOL;  Surgeon: Emily Flock, MD;  Location: [place];  Service: Gastroenterology;  Laterality: N/A;   NSVD     x1   POLYPECTOMY  01/02/2019   Procedure: POLYPECTOMY;  Surgeon: Emily Flock, MD;  Location: [facility] ENDOSCOPY;  Service: Gastroenterology;;   POLYPECTOMY     TONSILLECTOMY     UPPER GASTROINTESTINAL ENDOSCOPY     Patient Active Problem List   Diagnosis Date Noted   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 ([place]) 09/25/2020   Peripheral neuropathy 05/08/2020   Weakness 03/28/2020   Gait abnormality 03/28/2020   Paresthesia 03/28/2020   Left foot drop 03/23/2020   Heme positive stool    Benign neoplasm of colon    Acute blood loss anemia 12/30/2018   Symptomatic anemia 12/15/2018   AKI (acute kidney injury) ([place]) 12/15/2018   Hypomagnesemia 12/15/2018   Steroid-induced hyperglycemia 12/15/2018   Microscopic polyangiitis ([place]) 11/13/2018   Positive P-ANCA titer 10/14/2018   Viral URI 05/12/2018   Syncope 06/05/2016   Unspecified venous (  peripheral) insufficiency 11/23/2012   Dermatitis 11/10/2012   Hypothyroidism 05/22/2011   TYMPANIC MEMBRANE PERFORATION, LEFT EAR 08/08/2007   Osteopenia 07/27/2007   Hyperlipidemia 05/11/2007   Anxiety state 05/11/2007   Essential hypertension 05/11/2007   ALLERGIC RHINITIS 05/11/2007   Irritable bowel syndrome 05/11/2007   LOW BACK PAIN 05/11/2007   HYSTERECTOMY, HX OF 05/11/2007    SUBJECTIVE: Feels medication has leveled off. No falls. Has been using cane intermittently at home.  Pain: is patient experiencing pain? No.    OBJECTIVE:  Modified CTSIB: Test 1: 30 sec Test 2: 30  sec Test 3: 30 sec Test 4: 10 sec Total: 100 sec/120 sec  6 MWT (01/05/21): 510 feet with st. Cane Gait speed: 10 meter walk test: 0.77ms with st. Cane (01/05/21) Modified CTSIB (02/09/21) 100 sec  Gait speed: 10 meter walk test: 0.59 m/s (02/09/21)  Today's Treatment:  03/21/21 Gait training: 1 x 440' with st. Emily Cannon 1 instances of  R toe catching Partial tandem with st. Cane in R UE: horiontal head turns: 15x R and L Limits of stability training: standing on wobble baord with anterior/posterior and medial/lateral tilts without HHA: 2' each Fwd step up: 10" box: 10x R and L bil UE Sit to stand from 10" box: 2x5 without UE use.  03/07/21: Sit to stand from high mat table 10x Sit to stand from 14" box: 2 x 5 R and L Standing and turning head/body: 15x Box taps: 15x 6" box with st. Cane Fwd stepping: R and L 10x, moin A with stepping fwd with L, CGA with R LE   02/21/21: Soft tissue mobilization to bil gastroc/soleus, posterior tib group Manually stretched bil gastroc/soleus Grade III mobilization with sublatarl distraction on L LE Grade III-IV mobilization of eversion tilt of rear foot Gait training: 1 x 460 feet with st. Cane, one stumble where pt required mod A to regain balance, verbal cues throughout gait to pick up knees more to prevent R foot from catching. Tested bil hip flexors: 4+/5 bil  02/12/21: Standing on foam: EO: 2 x 1' with normal BOS Normal BOS on foam: EC: 2 x 1' Stepping up and down from airex: 4x R and L CGA to min A 360 deg turns: 5x cw, 5x ccw: cues for turning with shoulders to look where she is steping SLS: 5x R and L, CGA to min A SLS on floor: 3 x 30" R and L with 2 fingers from each hand UE support- educated pt and husband to practice this at home verbally (internet down in clinic at the time of session) Picking small objects from floor: put in different directions where pt had to take a step or turn to pick it up; Gait training: 1 x 460' without Ad with  min A due to 4 near falls due to catching of   Patient Education:  Education details: Pt educated to use rollator at home if she feels like she is stubling her R foot at home to improve safety and reduce fall risk  Person educated: Patient and Spouse Education method: Explanation Education comprehension: verbalized understanding     Home Exercise Program: Access Code: [email or web address]: [email or web address] Date: 10/13/2020 Prepared by: KMarkus Jarvis Exercises Straight Leg Raise - 1 x daily - 7 x weekly - 2 sets - 10 reps Supine Bridge - 1 x daily - 7 x weekly - 2 sets - 10 reps Sidelying Hip Abduction - 1 x daily - 7 x weekly - 2 sets -  10 reps Seated Knee Extension with Resistance - 1 x daily - 7 x weekly - 2 sets - 10 reps Seated Hamstring Curl with Anchored Resistance - 1 x daily - 7 x weekly - 2 sets - 10 reps    Assessment: Clinical impression:  Pt tolerated session well. Pt had one stumble with R LE with gait with st. Cane but able to ambulate for >400 fet without significant fatigue. Marland Kitchen  Rehab potential: Good   Clinical decision making: Stable/uncomplicated   Evaluation complexity: Moderate     Goals: Goals reviewed with patient? Yes   SHORT TERM GOALS:   STG Name Target Date Goal status  1 Pt will be be able to ambulate 1200' with RW to improve walking endurance Comments: Eval = 693' with RW; 1527' with RW (11/22/20) 11/03/20 Goal met   2 Pt will demo compliance with HEP to improve self management of symptoms. Comments: Eval = not issued; compliant 11/03/20 Goal met  3 Pt will get a shower chair and be able to sit and pivot into tub with min A from her husband. Got shower chair on 11/21/20 11/03/20 Goal met    LONG TERM GOALS:    LTG Name Target Date Goal status  1 Pt will be able to stand up from chair without HHA and without bracing knees against chair/bed to improve strength in bil LE Comments: Eval = needs to brace knees against chair to stand up;  able to stand up but rquires CGA; able to stand without bracing knees (12/15/20) 12/01/20 Goal met 12/15/20  2 Pt will be able to ambulate >2000' with RW and without rest break to improve walking endurance Comments: Eval = 693' with RW;11/22/20 1527; 12/01/20 Goal met 12/15/20  3 Pt will be able to ambulate at least 800' with LRAD and without rest break to improve functional ambulation Comments: Eval = 693' with RW without rest break, 874' with st. Cane with CGA, 1175' with st. Cane with CGA (12/13/20); 1050' with st cane and CGA (12/15/20); 1050' with st. Cane and SBA/CGA; 1050' with st. Cane SBA 02/09/21   Goal met  02/09/21  4 Pt will be able to don/doff her shoes/braces off at EOB to improve indepence. Comments: Eval =  Husband helps her with getting her shoes and braces and puts them on for her. Pt able to don/doff shoes but due to short legs her legs dangle and pt doesn't feel comfortable. 02/09/21 Not met  5.  Pt will demo 120/120 sec on Modified CTSIB to improve standing balance on uneven surfaces 100/120 sec (02/09/21) 04/06/21 (revised 02/09/21) Progressing, continue 02/09/21  6.  Pt will be able to stand on SLS for at least 8 seconds to improve curb negotiations  3 sec bil (02/09/21) 04/06/21 (added 02/09/21) Progressing continue    PLAN:   PT frequency: 1x/week   PT duration: 6 sessions or 8 weeks   Planned Interventions: Therapeutic exercises, Therapeutic activity, Neuro Muscular re-education, Balance training, Gait training, Patient/Family education, Joint mobilization, Stair training, Orthotic/Fit training, Cryotherapy and Moist heat   Plan for next session: Work on SLS, non compliant surface EC   Kerrie Pleasure, PT 03/21/2021, [facility] [address] [place], [place], [address] Phone: [phone]   Fax:  [phone]  Patient name: Emily Cannon MRN: [id number] DOB: [birth year]

## 2021-03-21 ENCOUNTER — Ambulatory Visit: Payer: Medicare Other

## 2021-03-21 ENCOUNTER — Other Ambulatory Visit: Payer: Self-pay

## 2021-03-21 DIAGNOSIS — M6281 Muscle weakness (generalized): Secondary | ICD-10-CM

## 2021-03-21 DIAGNOSIS — R2689 Other abnormalities of gait and mobility: Secondary | ICD-10-CM

## 2021-03-28 ENCOUNTER — Ambulatory Visit: Payer: Medicare Other

## 2021-04-03 ENCOUNTER — Ambulatory Visit: Payer: Medicare Other | Admitting: Neurology

## 2021-04-03 ENCOUNTER — Encounter: Payer: Self-pay | Admitting: Neurology

## 2021-04-03 VITALS — BP 166/61 | HR 63 | Ht 60.0 in | Wt 120.0 lb

## 2021-04-03 DIAGNOSIS — M359 Systemic involvement of connective tissue, unspecified: Secondary | ICD-10-CM

## 2021-04-03 DIAGNOSIS — R269 Unspecified abnormalities of gait and mobility: Secondary | ICD-10-CM

## 2021-04-03 DIAGNOSIS — R202 Paresthesia of skin: Secondary | ICD-10-CM | POA: Diagnosis not present

## 2021-04-03 DIAGNOSIS — G63 Polyneuropathy in diseases classified elsewhere: Secondary | ICD-10-CM | POA: Diagnosis not present

## 2021-04-03 NOTE — Progress Notes (Signed)
Chief Complaint  Patient presents with   Follow-up    New rm, with husband, states she is doing well, would like to continue PT     HISTORICAL  Emily Cannon is a 87 year old female seen in request by orthopedic surgeon Dr. Suella Broad and her primary care physician Dr. Carolann Littler for evaluation of gait abnormality, bilateral feet weakness, initial evaluation was with her husband on March 28, 2020  I reviewed and summarized the referring note. 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 Amil Amen receiving IV infusion every 6 months, patient does not know the name of medication, I assumed it was rituximab,  She also had kidney involvement, was treated by kidney physician Dr. 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 at the beginning of 2021, then she had significant worsening since June 2021,  She started to have left leg numbness extending from top of left foot to lateral leg, left leg weakness, began to rely on her walker, continue to progress over the past few months, now also developed right foot numbness, weakness, even with walker, she has significant difficulty,  She also noted to have worsening memory loss, has occasionally bowel and bladder incontinence, she denies visual loss, no dysarthria, no dysphagia  UPDATE May 08 2020:  MRI of brain on Sept 27 2021 showed mild generalized atrophy, no acute abnormalities. MRI of cervical spine showed multi-level degenerative changes, C5-6, C6-7 moderate canal stenosis. MRI of thoracic spine: widespread thoracic spine  degeneration,  Mild spinal stenosis, Right thyroid lobe nodule 2.3 cm.  Laboratory evaluation showed elevated WBC 14.5, Hg 12.3, CMP creat 1.43, TSH 2.35, triglyceride 533  EMG nerve conduction study today showed evidence of length dependent severe sensorimotor polyneuropathy, there is also evidence of chronic neuropathic changes involving left cervical myotomes, T1, C8 and bilateral lumbar sacral myotomes,  UPDATE Jul 18 2020: She is accompanied by her husband for an earlier appointment for progressive worsening bilateral distal leg weakness, gait abnormalities, increased difficulty to be taking care of at home  She denies double vision, no dysarthria, swallowing difficulty, denies upper extremity weakness, denies significant lower extremity sensory loss, mild low back pain, constipation, but denies incontinence  We personally reviewed MRI of lumbar spine from [facility] in August 2021, multilevel degenerative changes, 2 3, moderate central, moderate right foraminal stenosis, most noticeable at L3-4, with mild to moderate foraminal narrowing, no significant canal stenosis,  There was also reported mild T cystic partially demonstrated changes of pancreatic head, suggest for pancreatic head neoplasm, versus benign cystic change  This led to CT of abdomen with without contrast on April 27, 2020, 2 multilobulated pancreatic cyst measuring up to 3.1 cm in pancreatic head, no high risk imaging features,  EMG nerve conduction study in October 2021, showed severe sensorimotor polyneuropathy, also chronic neuropathic changes involving bilateral lumbar sacral myotomes, and the left cervical myotomes.  She had left ankle brace in October 2021, helped her left foot drop some, but she noticed progressive right ankle weakness, increased gait abnormality.  Has appointment pending with [facility] neuromuscular specialist on August 08, 2019  UPDATE Sep 25 2020: She  was seen by [facility] neurologist Dr. Orvilla Fus, MD - 08/07/2020    87 year old female with history of Wegener Granulomatosis (hemoptysis and impaired renal function) who has been stable on rituximab infusion every 6 months (managed by Dr. Amil Amen, rheumatologist, in [place]). The patient developed left foot drop along with tingling and painful sensation along peroneal nerve distribution in June 2021. A few months after that (around September 2021), she developed the same symptoms in her right foot. No arms involvement. Her kidney function has been stable. She received rituximab infusion every Octobrer and April and last one was in October 2021. She was referred to see a neurologist in [place] and EMG was performed in October 2021, which showed chronic bilateral lumbosacral radiculopathy, involving L4-L5, and S1 myotomes. In addition, there is evidence of length dependent sensorimotor polyneuropathy.   On my exam, she clearly has significant weakness in left > right peroneal, tibial, and sciatic (knee flexion) nerve distribution. There is sensory involvement primarily in the peroneal nerve distribution, bilaterally.   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 We will repeat her EMG study to determine severity and location of nerve biopsy. We will send labs to evaluate her kidney function, ANCA, inflammatory markers. She will need a nerve biopsy which will be determined after the EMG study. I will start her on prednisone 1 mg/kg (50 mg daily) for vasculitis treatment after the EMG study is done.   The patient had her EMG study done on 08/09/2020 which was consistent with mononeuritis multiplex. There is ongoing denervation which suggestive of active inflammation. The patient underwent a right sural nerve biopsy on 08/11/2020, findings consistent with peripheral neuropathy, no evidence of  vasculitis or inflammation, negative for amyloid, Congo red stain is negative,  She was started on prednisone 50 mg since January 2022, on tapering dose, she denies significant improvement, was also seen by [facility] rheumatologist Dr. Festus Holts  1.Consider rituximab treatment failure, change to cyclophosphamide. Given her age, frailty (though in overall great shape for her age), and prior infectious complications earlier on during remission induction therapy, this is less ideal.  2. Re-induce with rituximab at a higher dose than the 500 mg maintenance regimen she was on, either 1000 mg day 0 and 15 RA dosing or RAVE protocol weekly dosing x 4 weeks. I would also strongly consider adding avacopan 30 mg BID as an adjunct, with hopes this will add further disease control and also allow for rapid prednisone taper.   Receiving higher dose of rituximab induction, coordinated by her rheumatologist Dr. Melissa Noon office, wearing bilateral AFO, which has greatly improved her gait, nowcambulate with a walker, but there was no significant improvement in her lower extremity paresthesia, or weakness, she denies significant neuropathic pain  UPDATE Sept 6 2022: She is accompanied by her husband at today's visit, she is under the care of rheumatologist Dr. Baxter Flattery, receiving rituximab IV infusion, over the past few months also started Avacopan, tapering dose of prednisone for her microscopic angitis  She continues to have significant distal weakness, wearing bilateral AFO, significant improvement with physical therapy  REVIEW OF SYSTEMS: Full 14 system review of systems performed and notable only for as above All other review of systems were negative.  ALLERGIES: Allergies  Allergen Reactions   Alendronate Sodium Shortness Of Breath   Amoxicillin Diarrhea    Severe diarrhea   Codeine Nausea Only   Valsartan  Swelling    Gums/throat swell    HOME MEDICATIONS: Current Outpatient Medications   Medication Sig Dispense Refill   amLODipine (NORVASC) 10 MG tablet TAKE ONE TABLET BY MOUTH DAILY 90 tablet 3   atorvastatin (LIPITOR) 20 MG tablet TAKE ONE TABLET BY MOUTH DAILY 90 tablet 3   Avacopan (TAVNEOS) 10 MG CAPS Take 3 capsules by mouth twice daily with meals.     calcitRIOL (ROCALTROL) 0.25 MCG capsule Take 0.25 mcg by mouth every Monday, Wednesday, and Friday.      Cholecalciferol 25 MCG (1000 UT) tablet Take 1 capsule by mouth daily.     clotrimazole-betamethasone (LOTRISONE) cream Apply 1 application topically 2 (two) times daily. 30 g 0   esomeprazole (NEXIUM) 40 MG capsule TAKE ONE CAPSULE BY MOUTH DAILY 30 capsule 3   furosemide (LASIX) 40 MG tablet Take 0.5 tablets (20 mg total) by mouth 3 (three) times a week. Monday, Wed, Friday (Patient taking differently: Take 40 mg by mouth every Monday, Wednesday, and Friday.) 30 tablet    gabapentin (NEURONTIN) 100 MG capsule Take 2 capsules by mouth at night for neuropathy pain 180 capsule 3   levothyroxine (SYNTHROID) 50 MCG tablet TAKE ONE TABLET BY MOUTH EVERY MORNING BEFORE BREAKFAST 90 tablet 1   metoprolol succinate (TOPROL-XL) 50 MG 24 hr tablet TAKE ONE TABLET BY MOUTH TWICE A DAY 180 tablet 0   Multiple Vitamins-Minerals (CENTRUM SILVER 50+WOMEN) TABS Take 1 tablet by mouth daily with breakfast.     polyethylene glycol (MIRALAX) 17 g packet Take as directed twice daily and titrate as needed 14 each 0   predniSONE (DELTASONE) 10 MG tablet Take 10 mg by mouth daily with breakfast. Taking 2.5 tablets daily through 10/01/20 then 2 tablets daily through 10/08/20. They will then be further instructed by Dr. Baxter Flattery at [facility].     traMADol (ULTRAM) 50 MG tablet TAKE ONE TABLET BY MOUTH EVERY 6 HOURS AS NEEDED FOR UP TO 5 DAYS 30 tablet 0   valACYclovir (VALTREX) 1000 MG tablet Take 1,000 mg by mouth 3 (three) times daily. Started 03/27/20 - taking for 7 days.     No current facility-administered medications for this visit.     PAST MEDICAL HISTORY: Past Medical History:  Diagnosis Date   Allergic rhinitis, cause unspecified    Allergy    Anxiety state, unspecified    Blood transfusion without reported diagnosis    2020   Cataract    removed both eyes    Chronic kidney disease    Disorder of bone and cartilage, unspecified    Esophageal candidiasis (HCC)    Foot drop    Gastric ulcer    History of CMV    History of Helicobacter pylori infection    Hypothyroid    Irritable bowel syndrome    Lumbago    Microscopic polyangiitis (HCC)    Other and unspecified hyperlipidemia    Perforation of tympanic membrane, unspecified    Unspecified essential hypertension     PAST SURGICAL HISTORY: Past Surgical History:  Procedure Laterality Date   ABDOMINAL HYSTERECTOMY     BIOPSY  01/02/2019   Procedure: BIOPSY;  Surgeon: Yetta Flock, MD;  Location: [place];  Service: Gastroenterology;;   cataract surgery  5/12 and 12/24/2012   both eyes   COLONOSCOPY     COLONOSCOPY WITH PROPOFOL N/A 01/02/2019   Procedure: COLONOSCOPY WITH PROPOFOL;  Surgeon: Yetta Flock, MD;  Location: [place];  Service: Gastroenterology;  Laterality: N/A;  ESOPHAGOGASTRODUODENOSCOPY (EGD) WITH PROPOFOL N/A 01/02/2019   Procedure: ESOPHAGOGASTRODUODENOSCOPY (EGD) WITH PROPOFOL;  Surgeon: Yetta Flock, MD;  Location: [place];  Service: Gastroenterology;  Laterality: N/A;   NSVD     x1   POLYPECTOMY  01/02/2019   Procedure: POLYPECTOMY;  Surgeon: Yetta Flock, MD;  Location: [facility] ENDOSCOPY;  Service: Gastroenterology;;   POLYPECTOMY     TONSILLECTOMY     UPPER GASTROINTESTINAL ENDOSCOPY      FAMILY HISTORY: Family History  Problem Relation Age of Onset   Lung cancer Mother 51   Lung disease Father 65   COPD Other        sibling   Colon cancer Neg Hx    Colon polyps Neg Hx    Esophageal cancer Neg Hx    Rectal cancer Neg Hx    Stomach cancer Neg Hx     SOCIAL HISTORY: Social  History   Socioeconomic History   Marital status: Married    Spouse name: Jeneen Rinks   Number of children: 2   Years of education: 12   Highest education level: High school graduate  Occupational History   Occupation: retired Research scientist (physical sciences)  Tobacco Use   Smoking status: Never   Smokeless tobacco: Never  Vaping Use   Vaping Use: Never used  Substance and Sexual Activity   Alcohol use: No   Drug use: No   Sexual activity: Not Currently  Other Topics Concern   Not on file  Social History Narrative   Lives at home with her husband.   Right-handed.   1-2 cups coffee daily, occasional tea or soda.   Social Determinants of Health   Financial Resource Strain: Not on file  Food Insecurity: Not on file  Transportation Needs: Not on file  Physical Activity: Not on file  Stress: Not on file  Social Connections: Not on file  Intimate Partner Violence: Not on file     PHYSICAL EXAM  PHYSICAL EXAMNIATION:  Gen: NAD, conversant, well nourised, well groomed               NEUROLOGICAL EXAM:  MENTAL STATUS: Speech/cognition Awake, alert, oriented to history taking care of conversation   CRANIAL NERVES: CN II: Visual fields are full to confrontation. Pupils are round equal and briskly reactive to light. CN III, IV, VI: extraocular movement are normal. No ptosis. CN V: Facial sensation is intact to light touch CN VII: Face is symmetric with normal eye closure  CN VIII: Hearing is normal to causal conversation. CN IX, X: Phonation is normal. CN XI: Head turning and shoulder shrug are intact  MOTOR: Upper extremity motor strength is normal  LE Hip Flexion Knee flexion Knee extension Ankle Dorsiflexion Eversion Ankle plantar Flexion Inversion  R 5 5 5 1 1 4 3   L 5 5 5 1 1 4 3      REFLEXES: Reflexes are 2 and symmetric at the biceps, triceps, 1/1 knees, and absent at ankles. Plantar responses are mute bilaterally  SENSORY: Decreased vibratory sensation in toes, mildly length  dependent decreased pinprick to mid shin level  COORDINATION: There is no trunk or limb dysmetria noted.  GAIT/STANCE: Need assistant to get up from seated position, bilateral foot drop, can ambulate with assistance of walker wearing bilateral AFO  DIAGNOSTIC DATA (LABS, IMAGING, TESTING) - I reviewed patient records, labs, notes, testing and imaging myself where available.   ASSESSMENT AND PLAN  Emily Cannon is a 87 y.o. 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  Left more than lower extremity distal leg weakness, no significant neuropathic pain  Left sural nerve biopsy at [facility] in January 2022 showed findings consistent with peripheral neuropathy, no evidence of vasculitis or inflammation, negative for amyloid, Congo red stain is negative,   MRI of neural axis failed to demonstrate etiology.  Laboratory evaluations showed no treatable etiology  CT of the chest in December 2021 showed 2 new small solid pulmonary nodule, largest 0.5 cm in the right lower lobe, will have a repeat CT chest in 12 months  Referral to physical therapy  Treated by rheumatologist Dr. Baxter Flattery, receiving avacopan, tapering dose of prednisone, rituxan infusion.       Marcial Pacas, M.D. Ph.D.  [facility] [place], [place] [address] Phone: [phone] Fax:      [phone]

## 2021-04-04 ENCOUNTER — Ambulatory Visit: Payer: Medicare Other | Attending: Neurology

## 2021-04-04 ENCOUNTER — Other Ambulatory Visit: Payer: Self-pay

## 2021-04-04 DIAGNOSIS — R2689 Other abnormalities of gait and mobility: Secondary | ICD-10-CM | POA: Diagnosis present

## 2021-04-04 DIAGNOSIS — M6281 Muscle weakness (generalized): Secondary | ICD-10-CM | POA: Diagnosis not present

## 2021-04-04 NOTE — Therapy (Signed)
Outpatient Physical Therapy Recertification Note Patient Name: Emily Cannon MRN: [id number] DOB:02/01/1933, 87 y.o., female Today's Date: 04/04/2021   PT End of Session - 04/04/21 1453     Visit Number 31    Number of Visits 40    Date for PT Re-Evaluation 06/13/21    Authorization Type 10 v PN on 11/22/20; Recert on 1/65/53-7/48/27 (2x/week for 12 sessions), 10 v PN on 0/78/67; recert 5/44/92 to 0/1/00 (1x/week for 6 sessions); re cert 01/27/20 to 97/58/83 for 9 sessions in 10 weeks    Progress Note Due on Visit 40    PT Start Time 1450    PT Stop Time 1530    PT Time Calculation (min) 40 min    Equipment Utilized During Treatment Gait belt    Activity Tolerance Patient tolerated treatment well    Behavior During Therapy WFL for tasks assessed/performed              PCP: Eulas Post, MD Referring Provier: Bartholome Bill, MD  Referring Diagnosis: R26.9 (ICD-10-CM) - Gait abnormality M35.9,G63 (ICD-10-CM) - 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 ([place] Visit Diagnosis: Muscle weakness (generalized)  Other abnormalities of gait and mobility  Past Medical History:  Diagnosis Date   Allergic rhinitis, cause unspecified    Allergy    Anxiety state, unspecified    Blood transfusion without reported diagnosis    2020   Cataract    removed both eyes    Chronic kidney disease    Disorder of bone and cartilage, unspecified    Esophageal candidiasis (HCC)    Foot drop    Gastric ulcer    History of CMV    History of Helicobacter pylori infection    Hypothyroid    Irritable bowel syndrome    Lumbago    Microscopic polyangiitis ([place])    Other and unspecified hyperlipidemia    Perforation of tympanic membrane, unspecified    Unspecified essential hypertension    Past Surgical History:  Procedure Laterality Date   ABDOMINAL HYSTERECTOMY     BIOPSY  01/02/2019   Procedure: BIOPSY;  Surgeon: Yetta Flock, MD;  Location: [facility] ENDOSCOPY;   Service: Gastroenterology;;   cataract surgery  5/12 and 12/24/2012   both eyes   COLONOSCOPY     COLONOSCOPY WITH PROPOFOL N/A 01/02/2019   Procedure: COLONOSCOPY WITH PROPOFOL;  Surgeon: Yetta Flock, MD;  Location: [facility] ENDOSCOPY;  Service: Gastroenterology;  Laterality: N/A;   ESOPHAGOGASTRODUODENOSCOPY (EGD) WITH PROPOFOL N/A 01/02/2019   Procedure: ESOPHAGOGASTRODUODENOSCOPY (EGD) WITH PROPOFOL;  Surgeon: Yetta Flock, MD;  Location: [place];  Service: Gastroenterology;  Laterality: N/A;   NSVD     x1   POLYPECTOMY  01/02/2019   Procedure: POLYPECTOMY;  Surgeon: Yetta Flock, MD;  Location: [facility] ENDOSCOPY;  Service: Gastroenterology;;   POLYPECTOMY     TONSILLECTOMY     UPPER GASTROINTESTINAL ENDOSCOPY     Patient Active Problem List   Diagnosis Date Noted   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 ([place]) 09/25/2020   Peripheral neuropathy 05/08/2020   Weakness 03/28/2020   Gait abnormality 03/28/2020   Paresthesia 03/28/2020   Left foot drop 03/23/2020   Heme positive stool    Benign neoplasm of colon    Acute blood loss anemia 12/30/2018   Symptomatic anemia 12/15/2018   AKI (acute kidney injury) ([place]) 12/15/2018   Hypomagnesemia 12/15/2018   Steroid-induced hyperglycemia 12/15/2018   Microscopic polyangiitis ([place]) 11/13/2018   Positive P-ANCA titer 10/14/2018  Viral URI 05/12/2018   Syncope 06/05/2016   Unspecified venous (peripheral) insufficiency 11/23/2012   Dermatitis 11/10/2012   Hypothyroidism 05/22/2011   TYMPANIC MEMBRANE PERFORATION, LEFT EAR 08/08/2007   Osteopenia 07/27/2007   Hyperlipidemia 05/11/2007   Anxiety state 05/11/2007   Essential hypertension 05/11/2007   ALLERGIC RHINITIS 05/11/2007   Irritable bowel syndrome 05/11/2007   LOW BACK PAIN 05/11/2007   HYSTERECTOMY, HX OF 05/11/2007    SUBJECTIVE: Feels stable, no falls  Pain: is patient experiencing pain? No.    OBJECTIVE:  04/04/21 Objective  tests: Modified CTSIB: Test 1: 30 sec Test 2: 30 sec Test 3: 30 sec Test 4: 30 sec Total: 120 sec/120 sec  6 MWT (01/05/21): 510 feet with st. Cane; 738 feet with st. cane Gait speed: 10 meter walk test: 0.64ms with st. Cane (01/05/21), 0.55 m/s with st. Cane (04/04/21) Modified CTSIB (02/09/21) 100 sec ; 120 sec 04/04/21   Today's Treatment:  04/04/21:  Reassessment done today  03/21/21 Gait training: 1 x 440' with st. CKasandra Knudsen 1 instances of  R toe catching Partial tandem with st. Cane in R UE: horiontal head turns: 15x R and L Limits of stability training: standing on wobble baord with anterior/posterior and medial/lateral tilts without HHA: 2' each Fwd step up: 10" box: 10x R and L bil UE Sit to stand from 10" box: 2x5 without UE use.  03/07/21: Sit to stand from high mat table 10x Sit to stand from 14" box: 2 x 5 R and L Standing and turning head/body: 15x Box taps: 15x 6" box with st. Cane Fwd stepping: R and L 10x, moin A with stepping fwd with L, CGA with R LE   02/21/21: Soft tissue mobilization to bil gastroc/soleus, posterior tib group Manually stretched bil gastroc/soleus Grade III mobilization with sublatarl distraction on L LE Grade III-IV mobilization of eversion tilt of rear foot Gait training: 1 x 460 feet with st. Cane, one stumble where pt required mod A to regain balance, verbal cues throughout gait to pick up knees more to prevent R foot from catching. Tested bil hip flexors: 4+/5 bil  02/12/21: Standing on foam: EO: 2 x 1' with normal BOS Normal BOS on foam: EC: 2 x 1' Stepping up and down from airex: 4x R and L CGA to min A 360 deg turns: 5x cw, 5x ccw: cues for turning with shoulders to look where she is steping SLS: 5x R and L, CGA to min A SLS on floor: 3 x 30" R and L with 2 fingers from each hand UE support- educated pt and husband to practice this at home verbally (internet down in clinic at the time of session) Picking small objects from floor: put in  different directions where pt had to take a step or turn to pick it up; Gait training: 1 x 460' without Ad with min A due to 4 near falls due to catching of   Patient Education:  Education details: Pt educated to use rollator at home if she feels like she is stubling her R foot at home to improve safety and reduce fall risk  Person educated: Patient and Spouse Education method: Explanation Education comprehension: verbalized understanding     Home Exercise Program: Access Code: [email or web address]: [email or web address] Date: 10/13/2020 Prepared by: KMarkus Jarvis Exercises Straight Leg Raise - 1 x daily - 7 x weekly - 2 sets - 10 reps Supine Bridge - 1 x daily - 7 x weekly - 2 sets - 10  reps Sidelying Hip Abduction - 1 x daily - 7 x weekly - 2 sets - 10 reps Seated Knee Extension with Resistance - 1 x daily - 7 x weekly - 2 sets - 10 reps Seated Hamstring Curl with Anchored Resistance - 1 x daily - 7 x weekly - 2 sets - 10 reps    Assessment: Clinical impression:  Pt has been seen for total of 31 sessions from 10/06/20 to 04/04/21. Patient has demonstrated significant progress since her last re-assessment on 02/09/21. Patient is currently ambulating with st. Cane in home and using a rolling walker for long distance community ambulation. Patient has demonstrated significant improvement in her balance and proprioception (Modified CTSIB test), 6 minute walk test, and her gait speed. Patient still has potential to improve her single leg balance, gait speed and 6 minute walk test and will continue to benefit from skilled PT to improve overall function and reach highest level of potential to improve independence with functional activities.  Rehab potential: Good   Clinical decision making: Stable/uncomplicated   Evaluation complexity: Moderate     Goals: Goals reviewed with patient? Yes   SHORT TERM GOALS:   STG Name Target Date Goal status  1 Pt will be be able to ambulate  1200' with RW to improve walking endurance Comments: Eval = 693' with RW; 1527' with RW (11/22/20) 11/03/20 Goal met   2 Pt will demo compliance with HEP to improve self management of symptoms. Comments: Eval = not issued; compliant 11/03/20 Goal met  3 Pt will get a shower chair and be able to sit and pivot into tub with min A from her husband. Got shower chair on 11/21/20 11/03/20 Goal met  4 Pt will demo 0.63ms improvement in walking speed in 4 weeks to improve community ambulation 0.548m with st. CaKasandra Knudsen/7/22 05/02/21 Revised 04/04/21  Progressing, continue    LONG TERM GOALS:    LTG Name Target Date Goal status  1 Pt will be able to stand up from chair without HHA and without bracing knees against chair/bed to improve strength in bil LE Comments: Eval = needs to brace knees against chair to stand up; able to stand up but rquires CGA; able to stand without bracing knees (12/15/20) 12/01/20 Goal met 12/15/20  2 Pt will be able to ambulate >2000' with RW and without rest break to improve walking endurance Comments: Eval = 693' with RW;11/22/20 1527; 12/01/20 Goal met 12/15/20  3 Pt will be able to ambulate at least 800' with LRAD and without rest break to improve functional ambulation Comments: Eval = 693' with RW without rest break, 874' with st. Cane with CGA, 1175' with st. Cane with CGA (12/13/20); 1050' with st cane and CGA (12/15/20); 1050' with st. Cane and SBA/CGA; 1050' with st. Cane SBA 02/09/21   Goal met  02/09/21  4 Pt will be able to don/doff her shoes/braces off at EOB to improve indepence. Comments: Eval =  Husband helps her with getting her shoes and braces and puts them on for her. Pt able to don/doff shoes but due to short legs her legs dangle and pt doesn't feel comfortable. 02/09/21 Not met  5.  Pt will demo 120/120 sec on Modified CTSIB to improve standing balance on uneven surfaces 100/120 sec (02/09/21); 120/120 sec (04/04/21) 04/06/21 (revised 02/09/21) Goal met 04/04/21  6.  Pt will be able to  stand on SLS for at least 8 seconds to improve curb negotiations  3 sec bil (02/09/21);  4 sec on L and 5 sec on R (04/04/21) 06/13/21  (updated 04/06/21) Progressing continue  7. Pt will demo 0.51ms gait speed with st. Cane to confidentally access community negotiations 0.549m with st. CaKasandra Knudsen/7/22 06/13/21 Revised 04/04/21 Progressing continue    PLAN:   PT frequency: 1x/week   PT duration: 9 sessions in 10 weeks   Planned Interventions: Therapeutic exercises, Therapeutic activity, Neuro Muscular re-education, Balance training, Gait training, Patient/Family education, Joint mobilization, Stair training, Orthotic/Fit training, Cryotherapy and Moist heat   Plan for next session: Work on SLS, non compliant surface EC   KaKerrie PleasurePT 04/04/2021, [address]uLyndhurstrLa Paloma-Lost CreekNC[place]aska[address]hone: [phone] Fax:  [phone] name: Emily PERHAMRN: [id number]: [id number]

## 2021-04-11 ENCOUNTER — Ambulatory Visit: Payer: Medicare Other

## 2021-04-11 ENCOUNTER — Other Ambulatory Visit: Payer: Self-pay

## 2021-04-11 DIAGNOSIS — M6281 Muscle weakness (generalized): Secondary | ICD-10-CM

## 2021-04-11 DIAGNOSIS — R2689 Other abnormalities of gait and mobility: Secondary | ICD-10-CM

## 2021-04-11 NOTE — Therapy (Signed)
Outpatient Physical Therapy Recertification Note Patient Name: Emily Cannon MRN: [id number] DOB:Nov 24, 1932, 87 y.o., female Today's Date: 04/11/2021   PT End of Session - 04/11/21 1455     Visit Number 32    Number of Visits 40    Date for PT Re-Evaluation 06/13/21    Authorization Type 10 v PN on 11/22/20; Recert on 4/78/29-5/62/13 (2x/week for 12 sessions), 10 v PN on 0/86/57; recert 8/46/96 to 09/06/50 (1x/week for 6 sessions); re cert 03/01/12 to 24/40/10 for 9 sessions in 10 weeks    Progress Note Due on Visit 40    PT Start Time 1450    PT Stop Time 1530    PT Time Calculation (min) 40 min    Equipment Utilized During Treatment Gait belt    Activity Tolerance Patient tolerated treatment well    Behavior During Therapy WFL for tasks assessed/performed              PCP: Eulas Post, MD Referring Provier: Eulas Post, MD  Referring Diagnosis: R26.9 (ICD-10-CM) - Gait abnormality M35.9,G63 (ICD-10-CM) - 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 ([place] Visit Diagnosis: Muscle weakness (generalized)  Other abnormalities of gait and mobility  Past Medical History:  Diagnosis Date   Allergic rhinitis, cause unspecified    Allergy    Anxiety state, unspecified    Blood transfusion without reported diagnosis    2020   Cataract    removed both eyes    Chronic kidney disease    Disorder of bone and cartilage, unspecified    Esophageal candidiasis (HCC)    Foot drop    Gastric ulcer    History of CMV    History of Helicobacter pylori infection    Hypothyroid    Irritable bowel syndrome    Lumbago    Microscopic polyangiitis ([place])    Other and unspecified hyperlipidemia    Perforation of tympanic membrane, unspecified    Unspecified essential hypertension    Past Surgical History:  Procedure Laterality Date   ABDOMINAL HYSTERECTOMY     BIOPSY  01/02/2019   Procedure: BIOPSY;  Surgeon: Yetta Flock, MD;  Location: [facility] ENDOSCOPY;   Service: Gastroenterology;;   cataract surgery  5/12 and 12/24/2012   both eyes   COLONOSCOPY     COLONOSCOPY WITH PROPOFOL N/A 01/02/2019   Procedure: COLONOSCOPY WITH PROPOFOL;  Surgeon: Yetta Flock, MD;  Location: [facility] ENDOSCOPY;  Service: Gastroenterology;  Laterality: N/A;   ESOPHAGOGASTRODUODENOSCOPY (EGD) WITH PROPOFOL N/A 01/02/2019   Procedure: ESOPHAGOGASTRODUODENOSCOPY (EGD) WITH PROPOFOL;  Surgeon: Yetta Flock, MD;  Location: [place];  Service: Gastroenterology;  Laterality: N/A;   NSVD     x1   POLYPECTOMY  01/02/2019   Procedure: POLYPECTOMY;  Surgeon: Yetta Flock, MD;  Location: [facility] ENDOSCOPY;  Service: Gastroenterology;;   POLYPECTOMY     TONSILLECTOMY     UPPER GASTROINTESTINAL ENDOSCOPY     Patient Active Problem List   Diagnosis Date Noted   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 ([place]) 09/25/2020   Peripheral neuropathy 05/08/2020   Weakness 03/28/2020   Gait abnormality 03/28/2020   Paresthesia 03/28/2020   Left foot drop 03/23/2020   Heme positive stool    Benign neoplasm of colon    Acute blood loss anemia 12/30/2018   Symptomatic anemia 12/15/2018   AKI (acute kidney injury) ([place]) 12/15/2018   Hypomagnesemia 12/15/2018   Steroid-induced hyperglycemia 12/15/2018   Microscopic polyangiitis ([place]) 11/13/2018   Positive P-ANCA titer 10/14/2018  Viral URI 05/12/2018   Syncope 06/05/2016   Unspecified venous (peripheral) insufficiency 11/23/2012   Dermatitis 11/10/2012   Hypothyroidism 05/22/2011   TYMPANIC MEMBRANE PERFORATION, LEFT EAR 08/08/2007   Osteopenia 07/27/2007   Hyperlipidemia 05/11/2007   Anxiety state 05/11/2007   Essential hypertension 05/11/2007   ALLERGIC RHINITIS 05/11/2007   Irritable bowel syndrome 05/11/2007   LOW BACK PAIN 05/11/2007   HYSTERECTOMY, HX OF 05/11/2007    SUBJECTIVE: I am using cane at home and wlaking with rollator outside.  Pain: is patient experiencing pain? No.     OBJECTIVE:  04/04/21 Objective tests: Modified CTSIB: Test 1: 30 sec Test 2: 30 sec Test 3: 30 sec Test 4: 30 sec Total: 120 sec/120 sec  6 MWT (01/05/21): 510 feet with st. Cane; 738 feet with st. cane Gait speed: 10 meter walk test: 0.43ms with st. Cane (01/05/21), 0.55 m/s with st. Cane (04/04/21) Modified CTSIB (02/09/21) 100 sec ; 120 sec 04/04/21   Today's Treatment:  04/11/21: Functional reaching: standing on foam with wide BOS  OH reaching bil: 10x  Toe touching: 10x  Trunk twist with contralateral UE reach: 10x R and L Gait training: no AD: 1050', pt required min A 5x during the walk due to LOB, cues to take smaller steps on small ramps or curbs Curb step onto curb with pine needles: 1x with mod A due to LOB Pt education: pt educated on increasing walking distance at home to 20 min and continuing to use cane at home.  04/04/21:  Reassessment done today  03/21/21 Gait training: 1 x 440' with st. CKasandra Knudsen 1 instances of  R toe catching Partial tandem with st. Cane in R UE: horiontal head turns: 15x R and L Limits of stability training: standing on wobble baord with anterior/posterior and medial/lateral tilts without HHA: 2' each Fwd step up: 10" box: 10x R and L bil UE Sit to stand from 10" box: 2x5 without UE use.  03/07/21: Sit to stand from high mat table 10x Sit to stand from 14" box: 2 x 5 R and L Standing and turning head/body: 15x Box taps: 15x 6" box with st. Cane Fwd stepping: R and L 10x, moin A with stepping fwd with L, CGA with R LE   02/21/21: Soft tissue mobilization to bil gastroc/soleus, posterior tib group Manually stretched bil gastroc/soleus Grade III mobilization with sublatarl distraction on L LE Grade III-IV mobilization of eversion tilt of rear foot Gait training: 1 x 460 feet with st. Cane, one stumble where pt required mod A to regain balance, verbal cues throughout gait to pick up knees more to prevent R foot from catching. Tested bil hip flexors:  4+/5 bil  02/12/21: Standing on foam: EO: 2 x 1' with normal BOS Normal BOS on foam: EC: 2 x 1' Stepping up and down from airex: 4x R and L CGA to min A 360 deg turns: 5x cw, 5x ccw: cues for turning with shoulders to look where she is steping SLS: 5x R and L, CGA to min A SLS on floor: 3 x 30" R and L with 2 fingers from each hand UE support- educated pt and husband to practice this at home verbally (internet down in clinic at the time of session) Picking small objects from floor: put in different directions where pt had to take a step or turn to pick it up; Gait training: 1 x 460' without Ad with min A due to 4 near falls due to catching of  Patient Education:  Education details: Pt educated to use rollator at home if she feels like she is stubling her R foot at home to improve safety and reduce fall risk  Person educated: Patient and Spouse Education method: Explanation Education comprehension: verbalized understanding     Home Exercise Program: Access Code: [email or web address] URL: [email or web address] Date: 10/13/2020 Prepared by: Markus Jarvis  Exercises Straight Leg Raise - 1 x daily - 7 x weekly - 2 sets - 10 reps Supine Bridge - 1 x daily - 7 x weekly - 2 sets - 10 reps Sidelying Hip Abduction - 1 x daily - 7 x weekly - 2 sets - 10 reps Seated Knee Extension with Resistance - 1 x daily - 7 x weekly - 2 sets - 10 reps Seated Hamstring Curl with Anchored Resistance - 1 x daily - 7 x weekly - 2 sets - 10 reps    Assessment: Clinical impression:  Pt requires min A with walking without AD due to LOB. Patient needs to practice walking up and down curb step with LRAD due to decreased balance. Pt demo decreased balance with functional reaching on non compliant surface.  Rehab potential: Good   Clinical decision making: Stable/uncomplicated   Evaluation complexity: Moderate     Goals: Goals reviewed with patient? Yes   SHORT TERM GOALS:   STG Name Target Date  Goal status  1 Pt will be be able to ambulate 1200' with RW to improve walking endurance Comments: Eval = 693' with RW; 1527' with RW (11/22/20) 11/03/20 Goal met   2 Pt will demo compliance with HEP to improve self management of symptoms. Comments: Eval = not issued; compliant 11/03/20 Goal met  3 Pt will get a shower chair and be able to sit and pivot into tub with min A from her husband. Got shower chair on 11/21/20 11/03/20 Goal met  4 Pt will demo 0.50ms improvement in walking speed in 4 weeks to improve community ambulation 0.550m with st. CaKasandra Knudsen/7/22 05/02/21 Revised 04/04/21  Progressing, continue    LONG TERM GOALS:    LTG Name Target Date Goal status  1 Pt will be able to stand up from chair without HHA and without bracing knees against chair/bed to improve strength in bil LE Comments: Eval = needs to brace knees against chair to stand up; able to stand up but rquires CGA; able to stand without bracing knees (12/15/20) 12/01/20 Goal met 12/15/20  2 Pt will be able to ambulate >2000' with RW and without rest break to improve walking endurance Comments: Eval = 693' with RW;11/22/20 1527; 12/01/20 Goal met 12/15/20  3 Pt will be able to ambulate at least 800' with LRAD and without rest break to improve functional ambulation Comments: Eval = 693' with RW without rest break, 874' with st. Cane with CGA, 1175' with st. Cane with CGA (12/13/20); 1050' with st cane and CGA (12/15/20); 1050' with st. Cane and SBA/CGA; 1050' with st. Cane SBA 02/09/21   Goal met  02/09/21  4 Pt will be able to don/doff her shoes/braces off at EOB to improve indepence. Comments: Eval =  Husband helps her with getting her shoes and braces and puts them on for her. Pt able to don/doff shoes but due to short legs her legs dangle and pt doesn't feel comfortable. 02/09/21 Not met  5.  Pt will demo 120/120 sec on Modified CTSIB to improve standing balance on uneven surfaces 100/120 sec (02/09/21); 120/120 sec (04/04/21) 04/06/21 (revised  02/09/21) Goal met 04/04/21  6.  Pt will be able to stand on SLS for at least 8 seconds to improve curb negotiations  3 sec bil (02/09/21); 4 sec on L and 5 sec on R (04/04/21) 06/13/21  (updated 04/06/21) Progressing continue  7. Pt will demo 0.79ms gait speed with st. Cane to confidentally access community negotiations 0.5108m with st. CaKasandra Knudsen/7/22 06/13/21 Revised 04/04/21 Progressing continue    PLAN:   PT frequency: 1x/week   PT duration: 9 sessions in 10 weeks   Planned Interventions: Therapeutic exercises, Therapeutic activity, Neuro Muscular re-education, Balance training, Gait training, Patient/Family education, Joint mobilization, Stair training, Orthotic/Fit training, Cryotherapy and Moist heat   Plan for next session: Work on SLS, non compliant surface EC   KaKerrie PleasurePT 04/11/2021, [address] [place]hone: [phone] Fax:  [phone] name: ClMALENY CANDYRN: [id number]: [id number]

## 2021-04-18 ENCOUNTER — Ambulatory Visit: Payer: Medicare Other

## 2021-04-18 ENCOUNTER — Telehealth: Payer: Self-pay | Admitting: Family Medicine

## 2021-04-18 ENCOUNTER — Other Ambulatory Visit: Payer: Self-pay

## 2021-04-18 DIAGNOSIS — M6281 Muscle weakness (generalized): Secondary | ICD-10-CM

## 2021-04-18 DIAGNOSIS — R2689 Other abnormalities of gait and mobility: Secondary | ICD-10-CM

## 2021-04-18 NOTE — Therapy (Signed)
Outpatient Physical Therapy  Note Patient Name: Emily Cannon MRN: [id number] DOB:August 21, 1932, 87 y.o., female Today's Date: 04/18/2021   PT End of Session - 04/18/21 1451     Visit Number 33    Number of Visits 40    Date for PT Re-Evaluation 06/13/21    Authorization Type 10 v PN on 11/22/20; Recert on 3/84/53-6/46/80 (2x/week for 12 sessions), 10 v PN on 10/16/20; recert 4/82/50 to 0/3/70 (1x/week for 6 sessions); re cert 11/04/86 to 91/69/45 for 9 sessions in 10 weeks    Progress Note Due on Visit 40    PT Start Time 1450    PT Stop Time 1535    PT Time Calculation (min) 45 min    Equipment Utilized During Treatment Gait belt    Activity Tolerance Patient tolerated treatment well    Behavior During Therapy WFL for tasks assessed/performed              PCP: Eulas Post, MD Referring Provier: Marcial Pacas, MD  Referring Diagnosis: R26.9 (ICD-10-CM) - Gait abnormality M35.9,G63 (ICD-10-CM) - 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 ([place] Visit Diagnosis: Muscle weakness (generalized)  Other abnormalities of gait and mobility  Past Medical History:  Diagnosis Date   Allergic rhinitis, cause unspecified    Allergy    Anxiety state, unspecified    Blood transfusion without reported diagnosis    2020   Cataract    removed both eyes    Chronic kidney disease    Disorder of bone and cartilage, unspecified    Esophageal candidiasis (HCC)    Foot drop    Gastric ulcer    History of CMV    History of Helicobacter pylori infection    Hypothyroid    Irritable bowel syndrome    Lumbago    Microscopic polyangiitis (HCC)    Other and unspecified hyperlipidemia    Perforation of tympanic membrane, unspecified    Unspecified essential hypertension    Past Surgical History:  Procedure Laterality Date   ABDOMINAL HYSTERECTOMY     BIOPSY  01/02/2019   Procedure: BIOPSY;  Surgeon: Yetta Flock, MD;  Location: [facility] ENDOSCOPY;  Service:  Gastroenterology;;   cataract surgery  5/12 and 12/24/2012   both eyes   COLONOSCOPY     COLONOSCOPY WITH PROPOFOL N/A 01/02/2019   Procedure: COLONOSCOPY WITH PROPOFOL;  Surgeon: Yetta Flock, MD;  Location: [place];  Service: Gastroenterology;  Laterality: N/A;   ESOPHAGOGASTRODUODENOSCOPY (EGD) WITH PROPOFOL N/A 01/02/2019   Procedure: ESOPHAGOGASTRODUODENOSCOPY (EGD) WITH PROPOFOL;  Surgeon: Yetta Flock, MD;  Location: [place];  Service: Gastroenterology;  Laterality: N/A;   NSVD     x1   POLYPECTOMY  01/02/2019   Procedure: POLYPECTOMY;  Surgeon: Yetta Flock, MD;  Location: [facility] ENDOSCOPY;  Service: Gastroenterology;;   POLYPECTOMY     TONSILLECTOMY     UPPER GASTROINTESTINAL ENDOSCOPY     Patient Active Problem List   Diagnosis Date Noted   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 ([place]) 09/25/2020   Peripheral neuropathy 05/08/2020   Weakness 03/28/2020   Gait abnormality 03/28/2020   Paresthesia 03/28/2020   Left foot drop 03/23/2020   Heme positive stool    Benign neoplasm of colon    Acute blood loss anemia 12/30/2018   Symptomatic anemia 12/15/2018   AKI (acute kidney injury) ([place]) 12/15/2018   Hypomagnesemia 12/15/2018   Steroid-induced hyperglycemia 12/15/2018   Microscopic polyangiitis ([place]) 11/13/2018   Positive P-ANCA titer 10/14/2018  Viral URI 05/12/2018   Syncope 06/05/2016   Unspecified venous (peripheral) insufficiency 11/23/2012   Dermatitis 11/10/2012   Hypothyroidism 05/22/2011   TYMPANIC MEMBRANE PERFORATION, LEFT EAR 08/08/2007   Osteopenia 07/27/2007   Hyperlipidemia 05/11/2007   Anxiety state 05/11/2007   Essential hypertension 05/11/2007   ALLERGIC RHINITIS 05/11/2007   Irritable bowel syndrome 05/11/2007   LOW BACK PAIN 05/11/2007   HYSTERECTOMY, HX OF 05/11/2007    SUBJECTIVE: No new complaints  Pain: is patient experiencing pain? No.    OBJECTIVE:  04/04/21 Objective tests: Modified  CTSIB: Test 1: 30 sec Test 2: 30 sec Test 3: 30 sec Test 4: 30 sec Total: 120 sec/120 sec  6 MWT (01/05/21): 510 feet with st. Cane; 738 feet with st. cane Gait speed: 10 meter walk test: 0.77ms with st. Cane (01/05/21), 0.55 m/s with st. Cane (04/04/21) Modified CTSIB (02/09/21) 100 sec ; 120 sec 04/04/21   Today's Treatment:  04/18/21: Standing on foam with flat top: functional reaching  Bil OH reach: 10x  Toe touches: 10x Kicking 4 kg ball around cones placed in zigzag pattern 5 cones, 2x no AD Marching on trampoline: 20x no HHA Marching with one HHA: 10x R and L Practiced cooking scrambled eggs. Pt able to get things out of refrigerator, place it on countertop, bend to get pots and pans out, cook for 15'. Pt required SBA. Needed cues to not reach too far but step closer to minimize reaching.    04/11/21: Functional reaching: standing on foam with wide BOS  OH reaching bil: 10x  Toe touching: 10x  Trunk twist with contralateral UE reach: 10x R and L Gait training: no AD: 1050', pt required min A 5x during the walk due to LOB, cues to take smaller steps on small ramps or curbs Curb step onto curb with pine needles: 1x with mod A due to LOB Pt education: pt educated on increasing walking distance at home to 20 min and continuing to use cane at home.  04/04/21:  Reassessment done today  03/21/21 Gait training: 1 x 440' with st. CKasandra Knudsen 1 instances of  R toe catching Partial tandem with st. Cane in R UE: horiontal head turns: 15x R and L Limits of stability training: standing on wobble baord with anterior/posterior and medial/lateral tilts without HHA: 2' each Fwd step up: 10" box: 10x R and L bil UE Sit to stand from 10" box: 2x5 without UE use.  03/07/21: Sit to stand from high mat table 10x Sit to stand from 14" box: 2 x 5 R and L Standing and turning head/body: 15x Box taps: 15x 6" box with st. Cane Fwd stepping: R and L 10x, moin A with stepping fwd with L, CGA with R  LE   02/21/21: Soft tissue mobilization to bil gastroc/soleus, posterior tib group Manually stretched bil gastroc/soleus Grade III mobilization with sublatarl distraction on L LE Grade III-IV mobilization of eversion tilt of rear foot Gait training: 1 x 460 feet with st. Cane, one stumble where pt required mod A to regain balance, verbal cues throughout gait to pick up knees more to prevent R foot from catching. Tested bil hip flexors: 4+/5 bil  02/12/21: Standing on foam: EO: 2 x 1' with normal BOS Normal BOS on foam: EC: 2 x 1' Stepping up and down from airex: 4x R and L CGA to min A 360 deg turns: 5x cw, 5x ccw: cues for turning with shoulders to look where she is steping SLS: 5x R  and L, CGA to min A SLS on floor: 3 x 30" R and L with 2 fingers from each hand UE support- educated pt and husband to practice this at home verbally (internet down in clinic at the time of session) Picking small objects from floor: put in different directions where pt had to take a step or turn to pick it up; Gait training: 1 x 460' without Ad with min A due to 4 near falls due to catching of   Patient Education:  Education details: Pt educated to use rollator at home if she feels like she is stubling her R foot at home to improve safety and reduce fall risk  Person educated: Patient and Spouse Education method: Explanation Education comprehension: verbalized understanding     Home Exercise Program: Access Code: [email or web address] URL: [email or web address] Date: 10/13/2020 Prepared by: Markus Jarvis  Exercises Straight Leg Raise - 1 x daily - 7 x weekly - 2 sets - 10 reps Supine Bridge - 1 x daily - 7 x weekly - 2 sets - 10 reps Sidelying Hip Abduction - 1 x daily - 7 x weekly - 2 sets - 10 reps Seated Knee Extension with Resistance - 1 x daily - 7 x weekly - 2 sets - 10 reps Seated Hamstring Curl with Anchored Resistance - 1 x daily - 7 x weekly - 2 sets - 10 reps    Assessment: Clinical  impression:  Pt demonstrated good balance with dynamic gait and balance activities today. Pt able to cook scarmbled eggs 10-15' with SBA today. Pt is gradually getting confidence with gait, balance and ADLs.  Rehab potential: Good   Clinical decision making: Stable/uncomplicated   Evaluation complexity: Moderate     Goals: Goals reviewed with patient? Yes   SHORT TERM GOALS:   STG Name Target Date Goal status  1 Pt will be be able to ambulate 1200' with RW to improve walking endurance Comments: Eval = 693' with RW; 1527' with RW (11/22/20) 11/03/20 Goal met   2 Pt will demo compliance with HEP to improve self management of symptoms. Comments: Eval = not issued; compliant 11/03/20 Goal met  3 Pt will get a shower chair and be able to sit and pivot into tub with min A from her husband. Got shower chair on 11/21/20 11/03/20 Goal met  4 Pt will demo 0.59ms improvement in walking speed in 4 weeks to improve community ambulation 0.549m with st. CaKasandra Knudsen/7/22 05/02/21 Revised 04/04/21  Progressing, continue    LONG TERM GOALS:    LTG Name Target Date Goal status  1 Pt will be able to stand up from chair without HHA and without bracing knees against chair/bed to improve strength in bil LE Comments: Eval = needs to brace knees against chair to stand up; able to stand up but rquires CGA; able to stand without bracing knees (12/15/20) 12/01/20 Goal met 12/15/20  2 Pt will be able to ambulate >2000' with RW and without rest break to improve walking endurance Comments: Eval = 693' with RW;11/22/20 1527; 12/01/20 Goal met 12/15/20  3 Pt will be able to ambulate at least 800' with LRAD and without rest break to improve functional ambulation Comments: Eval = 693' with RW without rest break, 874' with st. Cane with CGA, 1175' with st. Cane with CGA (12/13/20); 1050' with st cane and CGA (12/15/20); 1050' with st. Cane and SBA/CGA; 1050' with st. Cane SBA 02/09/21   Goal met  02/09/21  4 Pt will be  able to don/doff her  shoes/braces off at EOB to improve indepence. Comments: Eval =  Husband helps her with getting her shoes and braces and puts them on for her. Pt able to don/doff shoes but due to short legs her legs dangle and pt doesn't feel comfortable. 02/09/21 Not met  5.  Pt will demo 120/120 sec on Modified CTSIB to improve standing balance on uneven surfaces 100/120 sec (02/09/21); 120/120 sec (04/04/21) 04/06/21 (revised 02/09/21) Goal met 04/04/21  6.  Pt will be able to stand on SLS for at least 8 seconds to improve curb negotiations  3 sec bil (02/09/21); 4 sec on L and 5 sec on R (04/04/21) 06/13/21  (updated 04/06/21) Progressing continue  7. Pt will demo 0.4ms gait speed with st. Cane to confidentally access community negotiations 0.531m with st. CaKasandra Knudsen/7/22 06/13/21 Revised 04/04/21 Progressing continue    PLAN:   PT frequency: 1x/week   PT duration: 9 sessions in 10 weeks   Planned Interventions: Therapeutic exercises, Therapeutic activity, Neuro Muscular re-education, Balance training, Gait training, Patient/Family education, Joint mobilization, Stair training, Orthotic/Fit training, Cryotherapy and Moist heat   Plan for next session: Work on SLS, non compliant surface EC   KaKerrie PleasurePT 04/18/2021, 15[id number]: [phone] Fax:  [phone] name: Emily Cannon: [id number]: 11September 20, 1934

## 2021-04-18 NOTE — Telephone Encounter (Signed)
Pt husband call and stated the doctor in [facility] change pt med to 1 time in the morning and 2 times at night pt need a refill on this med but more pills.metoprolol succinate (TOPROL-XL) 50 MG 24 hr tablet sent to  [id number] - [place] Phone:  [phone]  Fax:  [phone]

## 2021-04-19 NOTE — Telephone Encounter (Signed)
Spoke with the patient's husband. He is aware to reach out to the Doctor who changed the prescription to see if they can send in the new Rx.

## 2021-04-19 NOTE — Telephone Encounter (Signed)
Please advise. Rx in the patient's chart is for 1 tablet twice a day. Ok to change rx?  Please review in Dr. Erick Blinks absence.

## 2021-04-24 ENCOUNTER — Other Ambulatory Visit: Payer: Self-pay

## 2021-04-24 ENCOUNTER — Ambulatory Visit: Payer: Medicare Other

## 2021-04-24 DIAGNOSIS — R2689 Other abnormalities of gait and mobility: Secondary | ICD-10-CM

## 2021-04-24 DIAGNOSIS — M6281 Muscle weakness (generalized): Secondary | ICD-10-CM | POA: Diagnosis not present

## 2021-04-24 NOTE — Therapy (Signed)
Outpatient Physical Therapy  Note Patient Name: Emily Cannon MRN: [id number] DOB:March 11, 1933, 87 y.o., female Today's Date: 04/24/2021   PT End of Session - 04/24/21 1450     Visit Number 34    Number of Visits 40    Date for PT Re-Evaluation 06/13/21    Authorization Type 10 v PN on 11/22/20; Recert on 5/63/87-5/64/33 (2x/week for 12 sessions), 10 v PN on 2/95/18; recert 8/41/66 to 0/6/30 (1x/week for 6 sessions); re cert 08/04/99 to 09/32/35 for 9 sessions in 10 weeks    Progress Note Due on Visit 40    PT Start Time 1450    PT Stop Time 1535    PT Time Calculation (min) 45 min    Equipment Utilized During Treatment Gait belt    Activity Tolerance Patient tolerated treatment well    Behavior During Therapy WFL for tasks assessed/performed              PCP: Eulas Post, MD Referring Provier: Marcial Pacas, MD  Referring Diagnosis: R26.9 (ICD-10-CM) - Gait abnormality M35.9,G63 (ICD-10-CM) - 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 ([place] Visit Diagnosis: Muscle weakness (generalized)  Other abnormalities of gait and mobility  Past Medical History:  Diagnosis Date   Allergic rhinitis, cause unspecified    Allergy    Anxiety state, unspecified    Blood transfusion without reported diagnosis    2020   Cataract    removed both eyes    Chronic kidney disease    Disorder of bone and cartilage, unspecified    Esophageal candidiasis (HCC)    Foot drop    Gastric ulcer    History of CMV    History of Helicobacter pylori infection    Hypothyroid    Irritable bowel syndrome    Lumbago    Microscopic polyangiitis (HCC)    Other and unspecified hyperlipidemia    Perforation of tympanic membrane, unspecified    Unspecified essential hypertension    Past Surgical History:  Procedure Laterality Date   ABDOMINAL HYSTERECTOMY     BIOPSY  01/02/2019   Procedure: BIOPSY;  Surgeon: Yetta Flock, MD;  Location: [facility] ENDOSCOPY;  Service:  Gastroenterology;;   cataract surgery  5/12 and 12/24/2012   both eyes   COLONOSCOPY     COLONOSCOPY WITH PROPOFOL N/A 01/02/2019   Procedure: COLONOSCOPY WITH PROPOFOL;  Surgeon: Yetta Flock, MD;  Location: [place];  Service: Gastroenterology;  Laterality: N/A;   ESOPHAGOGASTRODUODENOSCOPY (EGD) WITH PROPOFOL N/A 01/02/2019   Procedure: ESOPHAGOGASTRODUODENOSCOPY (EGD) WITH PROPOFOL;  Surgeon: Yetta Flock, MD;  Location: [place];  Service: Gastroenterology;  Laterality: N/A;   NSVD     x1   POLYPECTOMY  01/02/2019   Procedure: POLYPECTOMY;  Surgeon: Yetta Flock, MD;  Location: [facility] ENDOSCOPY;  Service: Gastroenterology;;   POLYPECTOMY     TONSILLECTOMY     UPPER GASTROINTESTINAL ENDOSCOPY     Patient Active Problem List   Diagnosis Date Noted   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 ([place]) 09/25/2020   Peripheral neuropathy 05/08/2020   Weakness 03/28/2020   Gait abnormality 03/28/2020   Paresthesia 03/28/2020   Left foot drop 03/23/2020   Heme positive stool    Benign neoplasm of colon    Acute blood loss anemia 12/30/2018   Symptomatic anemia 12/15/2018   AKI (acute kidney injury) ([place]) 12/15/2018   Hypomagnesemia 12/15/2018   Steroid-induced hyperglycemia 12/15/2018   Microscopic polyangiitis ([place]) 11/13/2018   Positive P-ANCA titer 10/14/2018  Viral URI 05/12/2018   Syncope 06/05/2016   Unspecified venous (peripheral) insufficiency 11/23/2012   Dermatitis 11/10/2012   Hypothyroidism 05/22/2011   TYMPANIC MEMBRANE PERFORATION, LEFT EAR 08/08/2007   Osteopenia 07/27/2007   Hyperlipidemia 05/11/2007   Anxiety state 05/11/2007   Essential hypertension 05/11/2007   ALLERGIC RHINITIS 05/11/2007   Irritable bowel syndrome 05/11/2007   LOW BACK PAIN 05/11/2007   HYSTERECTOMY, HX OF 05/11/2007    SUBJECTIVE: I cooked a meal and used a walker to move around the kitchen. Cooking took me >1 hour without rest.  Pain: is patient  experiencing pain? No.    OBJECTIVE:  04/04/21 Objective tests: Modified CTSIB: Test 1: 30 sec Test 2: 30 sec Test 3: 30 sec Test 4: 30 sec Total: 120 sec/120 sec  6 MWT (01/05/21): 510 feet with st. Cane; 738 feet with st. cane Gait speed: 10 meter walk test: 0.73ms with st. Cane (01/05/21), 0.55 m/s with st. Cane (04/04/21) Modified CTSIB (02/09/21) 100 sec ; 120 sec 04/04/21   Today's Treatment:  04/24/21: Resisted gait training:  -Diona Foleyand Chain: with 10lb ankle weight strapped at unilateral ankle with theraband on floor and pt drags it behind her as she walks. Cues for longer step length to advance limb in front of other: 2 x 230' no AD, min a required at times for LOB and stability  - with black sport cord around waist and rehab aide provides resistance with posterior pull: 2 x 230' with same cues as above Pt education: pt educated to use st. Cane primarily in the house, use rollator at night and for community ambulation 04/18/21: Standing on foam with flat top: functional reaching  Bil OH reach: 10x  Toe touches: 10x Kicking 4 kg ball around cones placed in zigzag pattern 5 cones, 2x no AD Marching on trampoline: 20x no HHA Marching with one HHA: 10x R and L Practiced cooking scrambled eggs. Pt able to get things out of refrigerator, place it on countertop, bend to get pots and pans out, cook for 15'. Pt required SBA. Needed cues to not reach too far but step closer to minimize reaching.    04/11/21: Functional reaching: standing on foam with wide BOS  OH reaching bil: 10x  Toe touching: 10x  Trunk twist with contralateral UE reach: 10x R and L Gait training: no AD: 1050', pt required min A 5x during the walk due to LOB, cues to take smaller steps on small ramps or curbs Curb step onto curb with pine needles: 1x with mod A due to LOB Pt education: pt educated on increasing walking distance at home to 20 min and continuing to use cane at home.  04/04/21:  Reassessment done  today  03/21/21 Gait training: 1 x 440' with st. CKasandra Knudsen 1 instances of  R toe catching Partial tandem with st. Cane in R UE: horiontal head turns: 15x R and L Limits of stability training: standing on wobble baord with anterior/posterior and medial/lateral tilts without HHA: 2' each Fwd step up: 10" box: 10x R and L bil UE Sit to stand from 10" box: 2x5 without UE use.  03/07/21: Sit to stand from high mat table 10x Sit to stand from 14" box: 2 x 5 R and L Standing and turning head/body: 15x Box taps: 15x 6" box with st. Cane Fwd stepping: R and L 10x, moin A with stepping fwd with L, CGA with R LE   02/21/21: Soft tissue mobilization to bil gastroc/soleus, posterior tib group  Manually stretched bil gastroc/soleus Grade III mobilization with sublatarl distraction on L LE Grade III-IV mobilization of eversion tilt of rear foot Gait training: 1 x 460 feet with st. Cane, one stumble where pt required mod A to regain balance, verbal cues throughout gait to pick up knees more to prevent R foot from catching. Tested bil hip flexors: 4+/5 bil  02/12/21: Standing on foam: EO: 2 x 1' with normal BOS Normal BOS on foam: EC: 2 x 1' Stepping up and down from airex: 4x R and L CGA to min A 360 deg turns: 5x cw, 5x ccw: cues for turning with shoulders to look where she is steping SLS: 5x R and L, CGA to min A SLS on floor: 3 x 30" R and L with 2 fingers from each hand UE support- educated pt and husband to practice this at home verbally (internet down in clinic at the time of session) Picking small objects from floor: put in different directions where pt had to take a step or turn to pick it up; Gait training: 1 x 460' without Ad with min A due to 4 near falls due to catching of   Patient Education:  Education details: Pt educated to use rollator at home if she feels like she is stubling her R foot at home to improve safety and reduce fall risk  Person educated: Patient and Spouse Education  method: Explanation Education comprehension: verbalized understanding     Home Exercise Program: Access Code: [email or web address] URL: [email or web address] Date: 10/13/2020 Prepared by: Markus Jarvis  Exercises Straight Leg Raise - 1 x daily - 7 x weekly - 2 sets - 10 reps Supine Bridge - 1 x daily - 7 x weekly - 2 sets - 10 reps Sidelying Hip Abduction - 1 x daily - 7 x weekly - 2 sets - 10 reps Seated Knee Extension with Resistance - 1 x daily - 7 x weekly - 2 sets - 10 reps Seated Hamstring Curl with Anchored Resistance - 1 x daily - 7 x weekly - 2 sets - 10 reps    Assessment: Clinical impression:  Pt demonstrates decreased stability with L stance phase compared to R stance phase during resisted gait training. Overall she required min A for stability with resisted walking due to frequent LOB during the gait training.  Rehab potential: Good   Clinical decision making: Stable/uncomplicated   Evaluation complexity: Moderate     Goals: Goals reviewed with patient? Yes   SHORT TERM GOALS:   STG Name Target Date Goal status  1 Pt will be be able to ambulate 1200' with RW to improve walking endurance Comments: Eval = 693' with RW; 1527' with RW (11/22/20) 11/03/20 Goal met   2 Pt will demo compliance with HEP to improve self management of symptoms. Comments: Eval = not issued; compliant 11/03/20 Goal met  3 Pt will get a shower chair and be able to sit and pivot into tub with min A from her husband. Got shower chair on 11/21/20 11/03/20 Goal met  4 Pt will demo 0.64m/s improvement in walking speed in 4 weeks to improve community ambulation 0.37m/s with st. Kasandra Knudsen 04/04/21 05/02/21 Revised 04/04/21  Progressing, continue    LONG TERM GOALS:    LTG Name Target Date Goal status  1 Pt will be able to stand up from chair without HHA and without bracing knees against chair/bed to improve strength in bil LE Comments: Eval = needs to brace knees against chair to stand up;  able to stand up  but rquires CGA; able to stand without bracing knees (12/15/20) 12/01/20 Goal met 12/15/20  2 Pt will be able to ambulate >2000' with RW and without rest break to improve walking endurance Comments: Eval = 693' with RW;11/22/20 1527; 12/01/20 Goal met 12/15/20  3 Pt will be able to ambulate at least 800' with LRAD and without rest break to improve functional ambulation Comments: Eval = 693' with RW without rest break, 874' with st. Cane with CGA, 1175' with st. Cane with CGA (12/13/20); 1050' with st cane and CGA (12/15/20); 1050' with st. Cane and SBA/CGA; 1050' with st. Cane SBA 02/09/21   Goal met  02/09/21  4 Pt will be able to don/doff her shoes/braces off at EOB to improve indepence. Comments: Eval =  Husband helps her with getting her shoes and braces and puts them on for her. Pt able to don/doff shoes but due to short legs her legs dangle and pt doesn't feel comfortable. 02/09/21 Not met  5.  Pt will demo 120/120 sec on Modified CTSIB to improve standing balance on uneven surfaces 100/120 sec (02/09/21); 120/120 sec (04/04/21) 04/06/21 (revised 02/09/21) Goal met 04/04/21  6.  Pt will be able to stand on SLS for at least 8 seconds to improve curb negotiations  3 sec bil (02/09/21); 4 sec on L and 5 sec on R (04/04/21) 06/13/21  (updated 04/06/21) Progressing continue  7. Pt will demo 0.66ms gait speed with st. Cane to confidentally access community negotiations 0.562m with st. CaKasandra Knudsen/7/22 06/13/21 Revised 04/04/21 Progressing continue    PLAN:   PT frequency: 1x/week   PT duration: 9 sessions in 10 weeks   Planned Interventions: Therapeutic exercises, Therapeutic activity, Neuro Muscular re-education, Balance training, Gait training, Patient/Family education, Joint mobilization, Stair training, Orthotic/Fit training, Cryotherapy and Moist heat   Plan for next session: Work on SLS, non compliant surface EC   KaKerrie PleasurePT 04/24/2021, [address] [place] City[place]hone: [phone] Fax:  [phone] name: Emily Cannon: [id number]: 1111-Apr-1934

## 2021-04-25 ENCOUNTER — Ambulatory Visit: Payer: Medicare Other | Admitting: Internal Medicine

## 2021-04-25 ENCOUNTER — Encounter: Payer: Self-pay | Admitting: Internal Medicine

## 2021-04-25 DIAGNOSIS — I10 Essential (primary) hypertension: Secondary | ICD-10-CM

## 2021-04-25 IMAGING — US US RENAL
1 series · 14 of 25 positions shown · non-contrast
Comparison: None.

CLINICAL DATA: 85-year-old female with acute renal failure

EXAM:
RENAL / URINARY TRACT ULTRASOUND COMPLETE

[Series 1: us renal · 0.23mm/px · 14 of 38 slices shown]
[im 1/38]
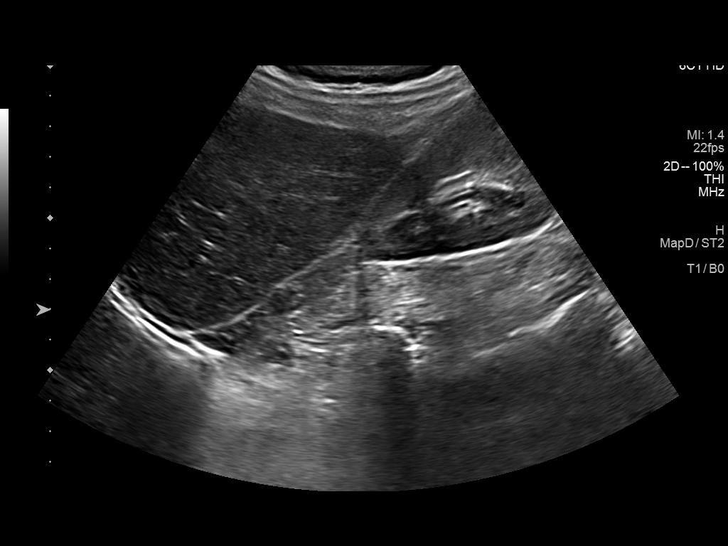
[im 4/38]
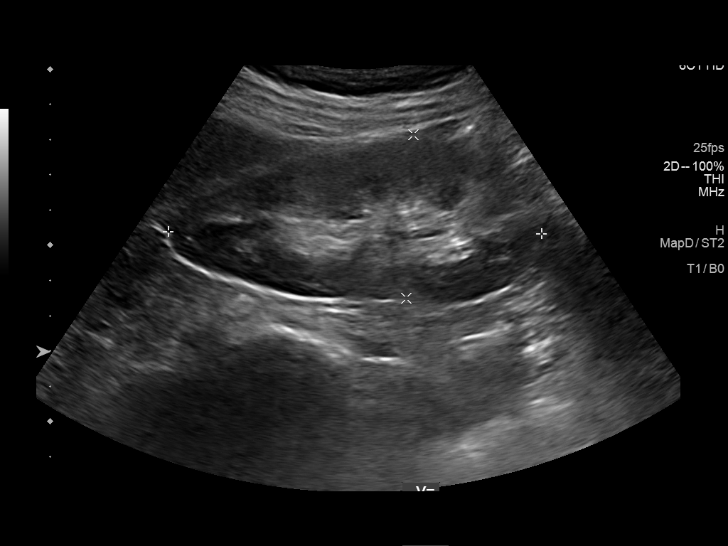
[im 7/38]
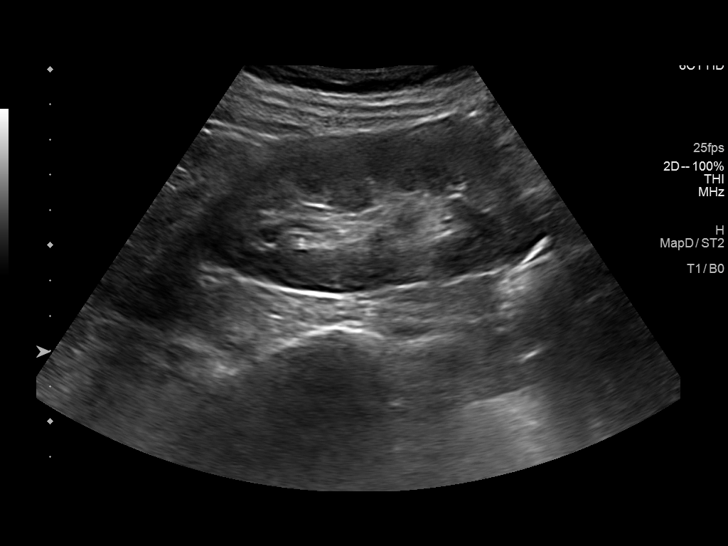
[im 10/38]
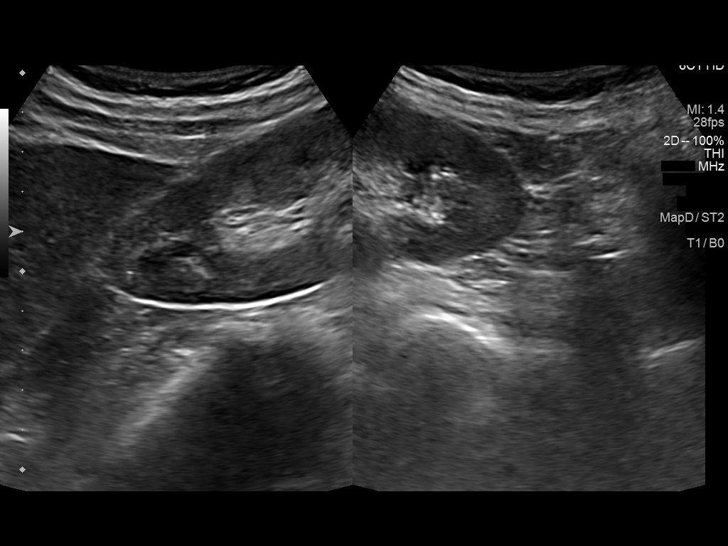
[im 13/38]
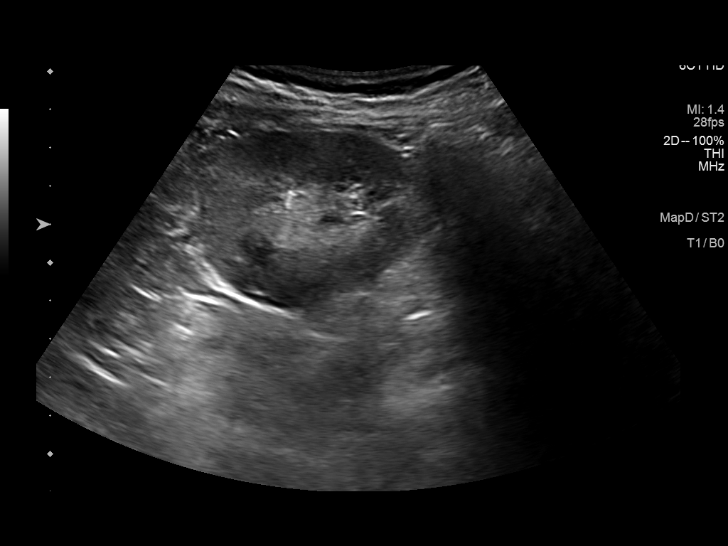
[im 14/38]
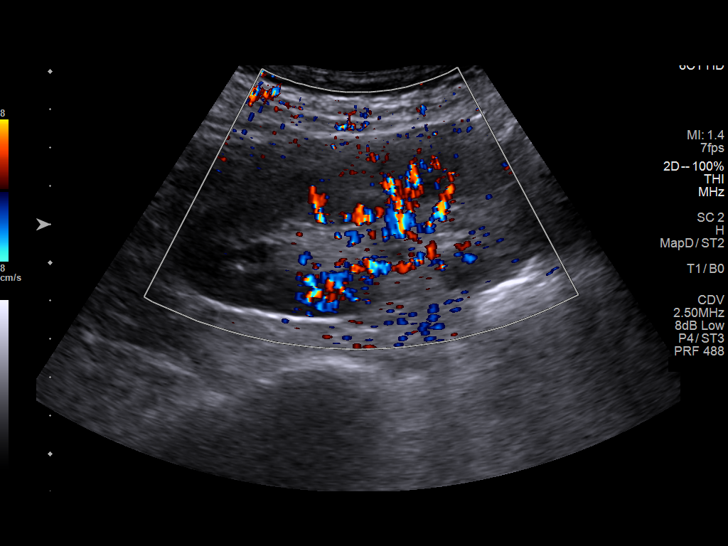
[im 17/38]
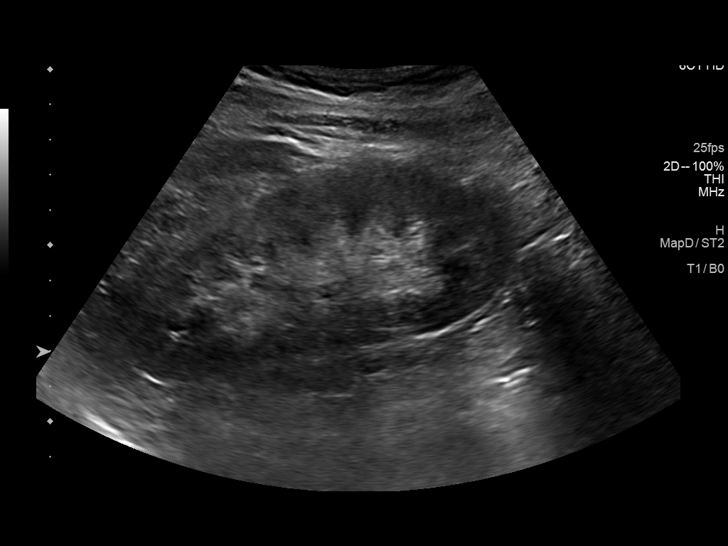
[im 21/38]
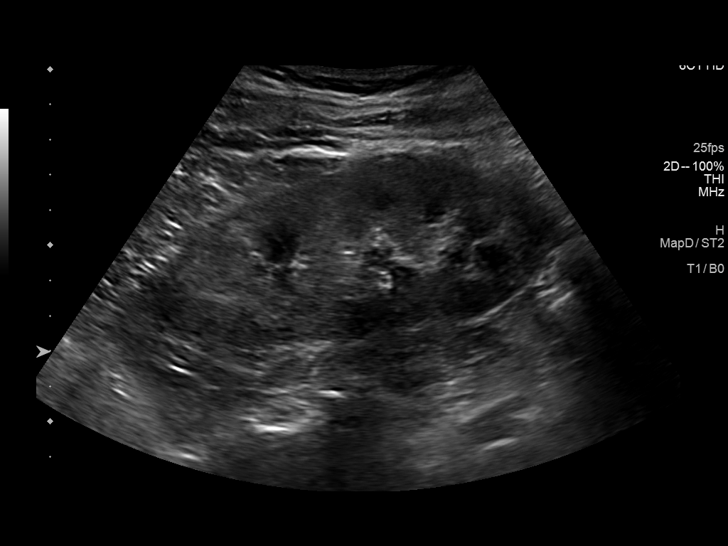
[im 24/38]
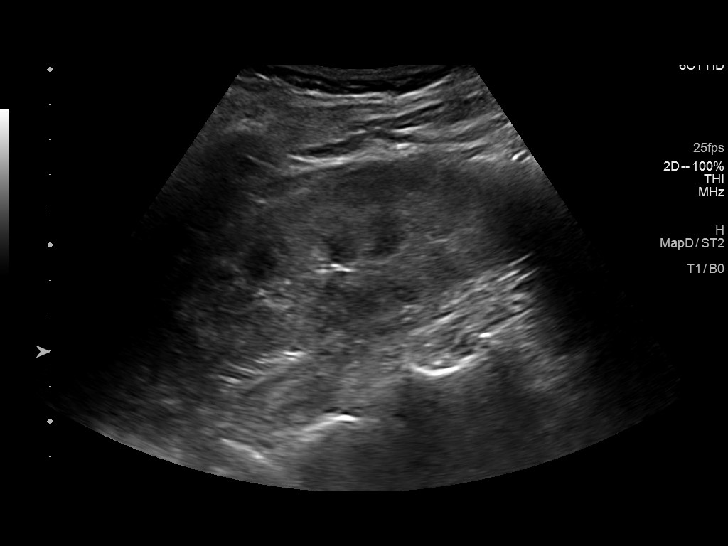
[im 25/38]
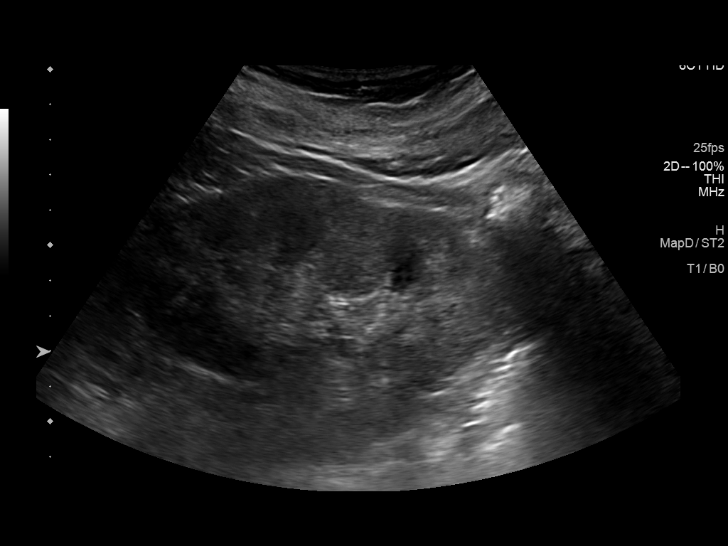
[im 28/38]
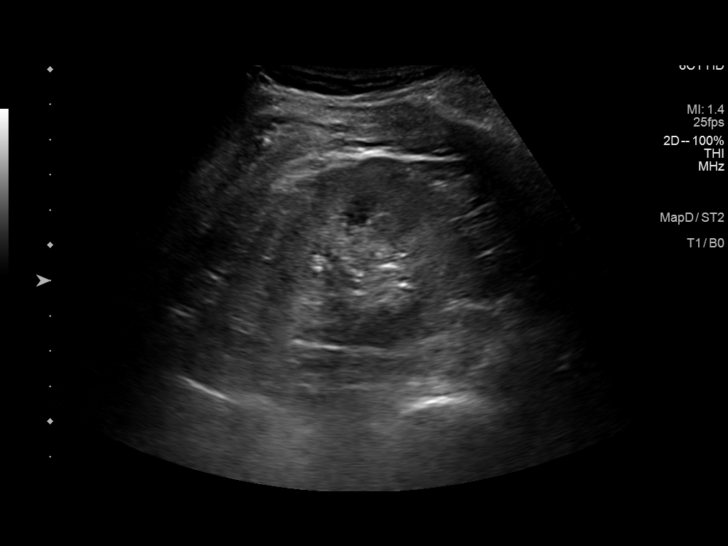
[im 31/38]
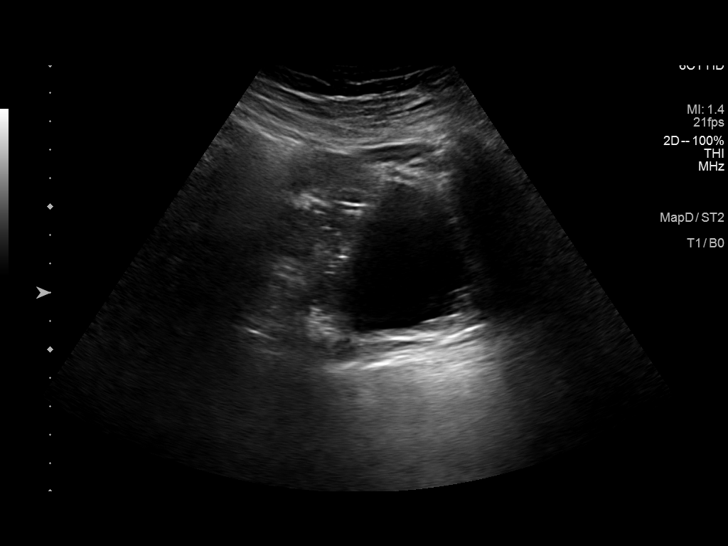
[im 34/38]
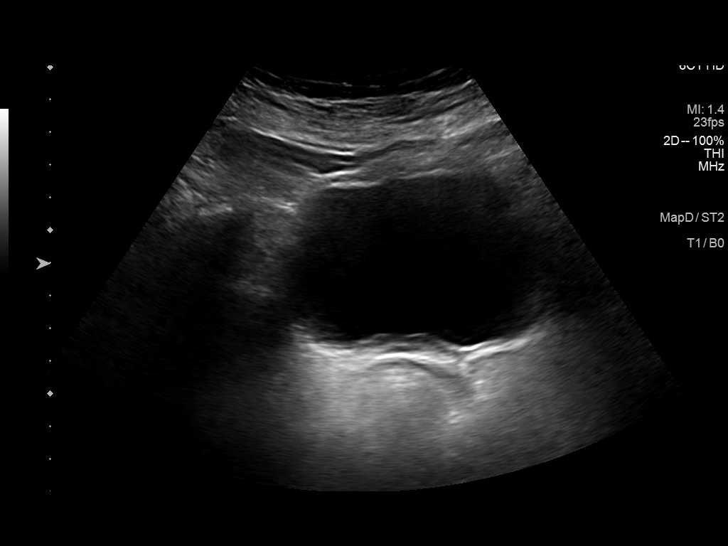
[im 38/38]
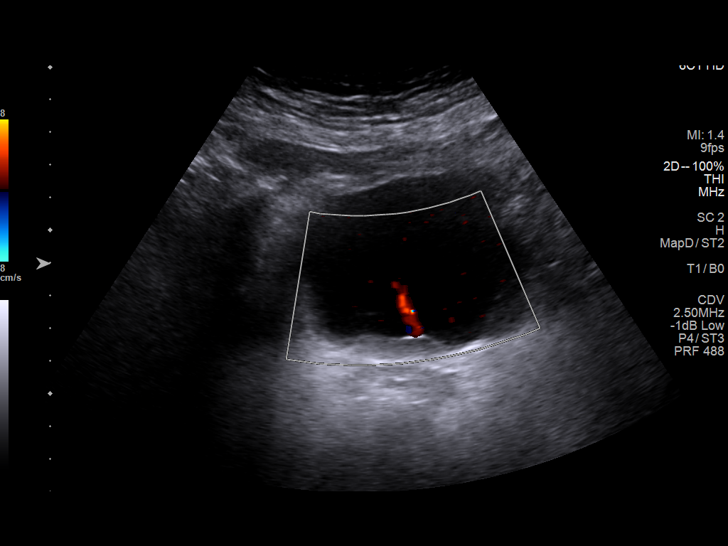

[14 of 25 positions shown; findings below may reference images not displayed]

FINDINGS: Right Kidney:

Length: 10.6 cm x 4.6 cm x 4.8 cm, 122 cc. Echogenicity within
normal limits. No mass or hydronephrosis visualized.

Left Kidney:

Length: 11.7 cm x 4.9 cm x 5.5 cm, 164 cc. Echogenicity within
normal limits. No mass or hydronephrosis visualized.

Bladder:

Appears normal for degree of bladder distention.
IMPRESSION: Negative for hydronephrosis.

Unremarkable echogenicity of the kidney parenchyma.

## 2021-04-25 MED ORDER — METOPROLOL SUCCINATE ER 50 MG PO TB24: 50.0000 mg | ORAL_TABLET | Freq: Two times a day (BID) | ORAL | 0 refills | Status: DC

## 2021-04-25 NOTE — Progress Notes (Signed)
Established Patient Office Visit     This visit occurred during the SARS-CoV-2 public health emergency.  Safety protocols were in place, including screening questions prior to the visit, additional usage of staff PPE, and extensive cleaning of exam room while observing appropriate contact time as indicated for disinfecting solutions.    CC/Reason for Visit: Need refills of metoprolol  HPI: Emily Cannon is a 87 y.o. female who is coming in today for the above mentioned reasons.  She is here today requesting a metoprolol refill as her PCP is out of the office this week.  It appears her husband, who administers her medications, was confused.  Instead of increasing her gabapentin to 1 tablet in the morning and 2 tablets in the afternoon he increased the metoprolol and ran out sooner.  Patient is feeling well and has no acute concerns.  Past Medical/Surgical History: Past Medical History:  Diagnosis Date   Allergic rhinitis, cause unspecified    Allergy    Anxiety state, unspecified    Blood transfusion without reported diagnosis    2020   Cataract    removed both eyes    Chronic kidney disease    Disorder of bone and cartilage, unspecified    Esophageal candidiasis (HCC)    Foot drop    Gastric ulcer    History of CMV    History of Helicobacter pylori infection    Hypothyroid    Irritable bowel syndrome    Lumbago    Microscopic polyangiitis (HCC)    Other and unspecified hyperlipidemia    Perforation of tympanic membrane, unspecified    Unspecified essential hypertension     Past Surgical History:  Procedure Laterality Date   ABDOMINAL HYSTERECTOMY     BIOPSY  01/02/2019   Procedure: BIOPSY;  Surgeon: Yetta Flock, MD;  Location: [place];  Service: Gastroenterology;;   cataract surgery  5/12 and 12/24/2012   both eyes   COLONOSCOPY     COLONOSCOPY WITH PROPOFOL N/A 01/02/2019   Procedure: COLONOSCOPY WITH PROPOFOL;  Surgeon: Yetta Flock, MD;   Location: [place];  Service: Gastroenterology;  Laterality: N/A;   ESOPHAGOGASTRODUODENOSCOPY (EGD) WITH PROPOFOL N/A 01/02/2019   Procedure: ESOPHAGOGASTRODUODENOSCOPY (EGD) WITH PROPOFOL;  Surgeon: Yetta Flock, MD;  Location: [place];  Service: Gastroenterology;  Laterality: N/A;   NSVD     x1   POLYPECTOMY  01/02/2019   Procedure: POLYPECTOMY;  Surgeon: Yetta Flock, MD;  Location: [facility] ENDOSCOPY;  Service: Gastroenterology;;   POLYPECTOMY     TONSILLECTOMY     UPPER GASTROINTESTINAL ENDOSCOPY      Social History:  reports that she has never smoked. She has never used smokeless tobacco. She reports that she does not drink alcohol and does not use drugs.  Allergies: Allergies  Allergen Reactions   Alendronate Sodium Shortness Of Breath   Amoxicillin Diarrhea    Severe diarrhea   Codeine Nausea Only   Valsartan Swelling    Gums/throat swell    Family History:  Family History  Problem Relation Age of Onset   Lung cancer Mother 58   Lung disease Father 69   COPD Other        sibling   Colon cancer Neg Hx    Colon polyps Neg Hx    Esophageal cancer Neg Hx    Rectal cancer Neg Hx    Stomach cancer Neg Hx      Current Outpatient Medications:    amLODipine (NORVASC) 10  MG tablet, TAKE ONE TABLET BY MOUTH DAILY, Disp: 90 tablet, Rfl: 3   atorvastatin (LIPITOR) 20 MG tablet, TAKE ONE TABLET BY MOUTH DAILY, Disp: 90 tablet, Rfl: 3   Avacopan (TAVNEOS) 10 MG CAPS, Take 3 capsules by mouth twice daily with meals., Disp: , Rfl:    calcitRIOL (ROCALTROL) 0.25 MCG capsule, Take 0.25 mcg by mouth every Monday, Wednesday, and Friday. , Disp: , Rfl:    Cholecalciferol 25 MCG (1000 UT) tablet, Take 1 capsule by mouth daily., Disp: , Rfl:    clotrimazole-betamethasone (LOTRISONE) cream, Apply 1 application topically 2 (two) times daily., Disp: 30 g, Rfl: 0   esomeprazole (NEXIUM) 40 MG capsule, TAKE ONE CAPSULE BY MOUTH DAILY, Disp: 30 capsule, Rfl: 3   furosemide  (LASIX) 40 MG tablet, Take 0.5 tablets (20 mg total) by mouth 3 (three) times a week. Monday, Wed, Friday (Patient taking differently: Take 40 mg by mouth every Monday, Wednesday, and Friday.), Disp: 30 tablet, Rfl:    gabapentin (NEURONTIN) 100 MG capsule, Take 2 capsules by mouth at night for neuropathy pain, Disp: 180 capsule, Rfl: 3   levothyroxine (SYNTHROID) 50 MCG tablet, TAKE ONE TABLET BY MOUTH EVERY MORNING BEFORE BREAKFAST, Disp: 90 tablet, Rfl: 1   Multiple Vitamins-Minerals (CENTRUM SILVER 50+WOMEN) TABS, Take 1 tablet by mouth daily with breakfast., Disp: , Rfl:    polyethylene glycol (MIRALAX) 17 g packet, Take as directed twice daily and titrate as needed, Disp: 14 each, Rfl: 0   predniSONE (DELTASONE) 10 MG tablet, Take 10 mg by mouth daily with breakfast. Taking 2.5 tablets daily through 10/01/20 then 2 tablets daily through 10/08/20. They will then be further instructed by Dr. Baxter Flattery at [facility] Disp: , Rfl:    traMADol (ULTRAM) 50 MG tablet, TAKE ONE TABLET BY MOUTH EVERY 6 HOURS AS NEEDED FOR UP TO 5 DAYS, Disp: 30 tablet, Rfl: 0   valACYclovir (VALTREX) 1000 MG tablet, Take 1,000 mg by mouth 3 (three) times daily. Started 03/27/20 - taking for 7 days., Disp: , Rfl:    metoprolol succinate (TOPROL-XL) 50 MG 24 hr tablet, Take 1 tablet (50 mg total) by mouth 2 (two) times daily. Take with or immediately following a meal., Disp: 180 tablet, Rfl: 0  Review of Systems:  Constitutional: Denies fever, chills, diaphoresis, appetite change and fatigue.  HEENT: Denies photophobia, eye pain, redness, hearing loss, ear pain, congestion, sore throat, rhinorrhea, sneezing, mouth sores, trouble swallowing, neck pain, neck stiffness and tinnitus.   Respiratory: Denies SOB, DOE, cough, chest tightness,  and wheezing.   Cardiovascular: Denies chest pain, palpitations and leg swelling.  Gastrointestinal: Denies nausea, vomiting, abdominal pain, diarrhea, constipation, blood in stool and  abdominal distention.  Genitourinary: Denies dysuria, urgency, frequency, hematuria, flank pain and difficulty urinating.  Endocrine: Denies: hot or cold intolerance, sweats, changes in hair or nails, polyuria, polydipsia. Musculoskeletal: Denies myalgias, back pain, joint swelling, arthralgias and gait problem.  Skin: Denies pallor, rash and wound.  Neurological: Denies dizziness, seizures, syncope, weakness, light-headedness, numbness and headaches.  Hematological: Denies adenopathy. Easy bruising, personal or family bleeding history  Psychiatric/Behavioral: Denies suicidal ideation, mood changes, confusion, nervousness, sleep disturbance and agitation    Physical Exam: Vitals:   04/25/21 1335  BP: 140/70  Pulse: 64  Temp: 98.2 F (36.8 C)  TempSrc: Oral  SpO2: 97%  Weight: 125 lb (56.7 kg)    Body mass index is 24.41 kg/m.   Constitutional: NAD, calm, comfortable Eyes: PERRL, lids and conjunctivae normal ENMT: Mucous membranes  are moist.  Psychiatric: Normal judgment and insight. Alert and oriented x 3. Normal mood.    Impression and Plan:  Essential hypertension  - Plan: metoprolol succinate (TOPROL-XL) 50 MG 24 hr tablet -Medication refills provided as requested.  Time spent: 21 minutes reviewing chart, interviewing and examining pain and and formulating plan of care.    Lelon Frohlich, MD [facility]

## 2021-04-26 IMAGING — DX PORTABLE CHEST - 1 VIEW
1 series · 1 of 1 positions shown · non-contrast
Comparison: CT chest dated 10/22/2018

CLINICAL DATA: Microscopic polyangiitis. Bilateral lower extremity
swelling. Hypertension.

EXAM:
PORTABLE CHEST 1 VIEW

[chest]
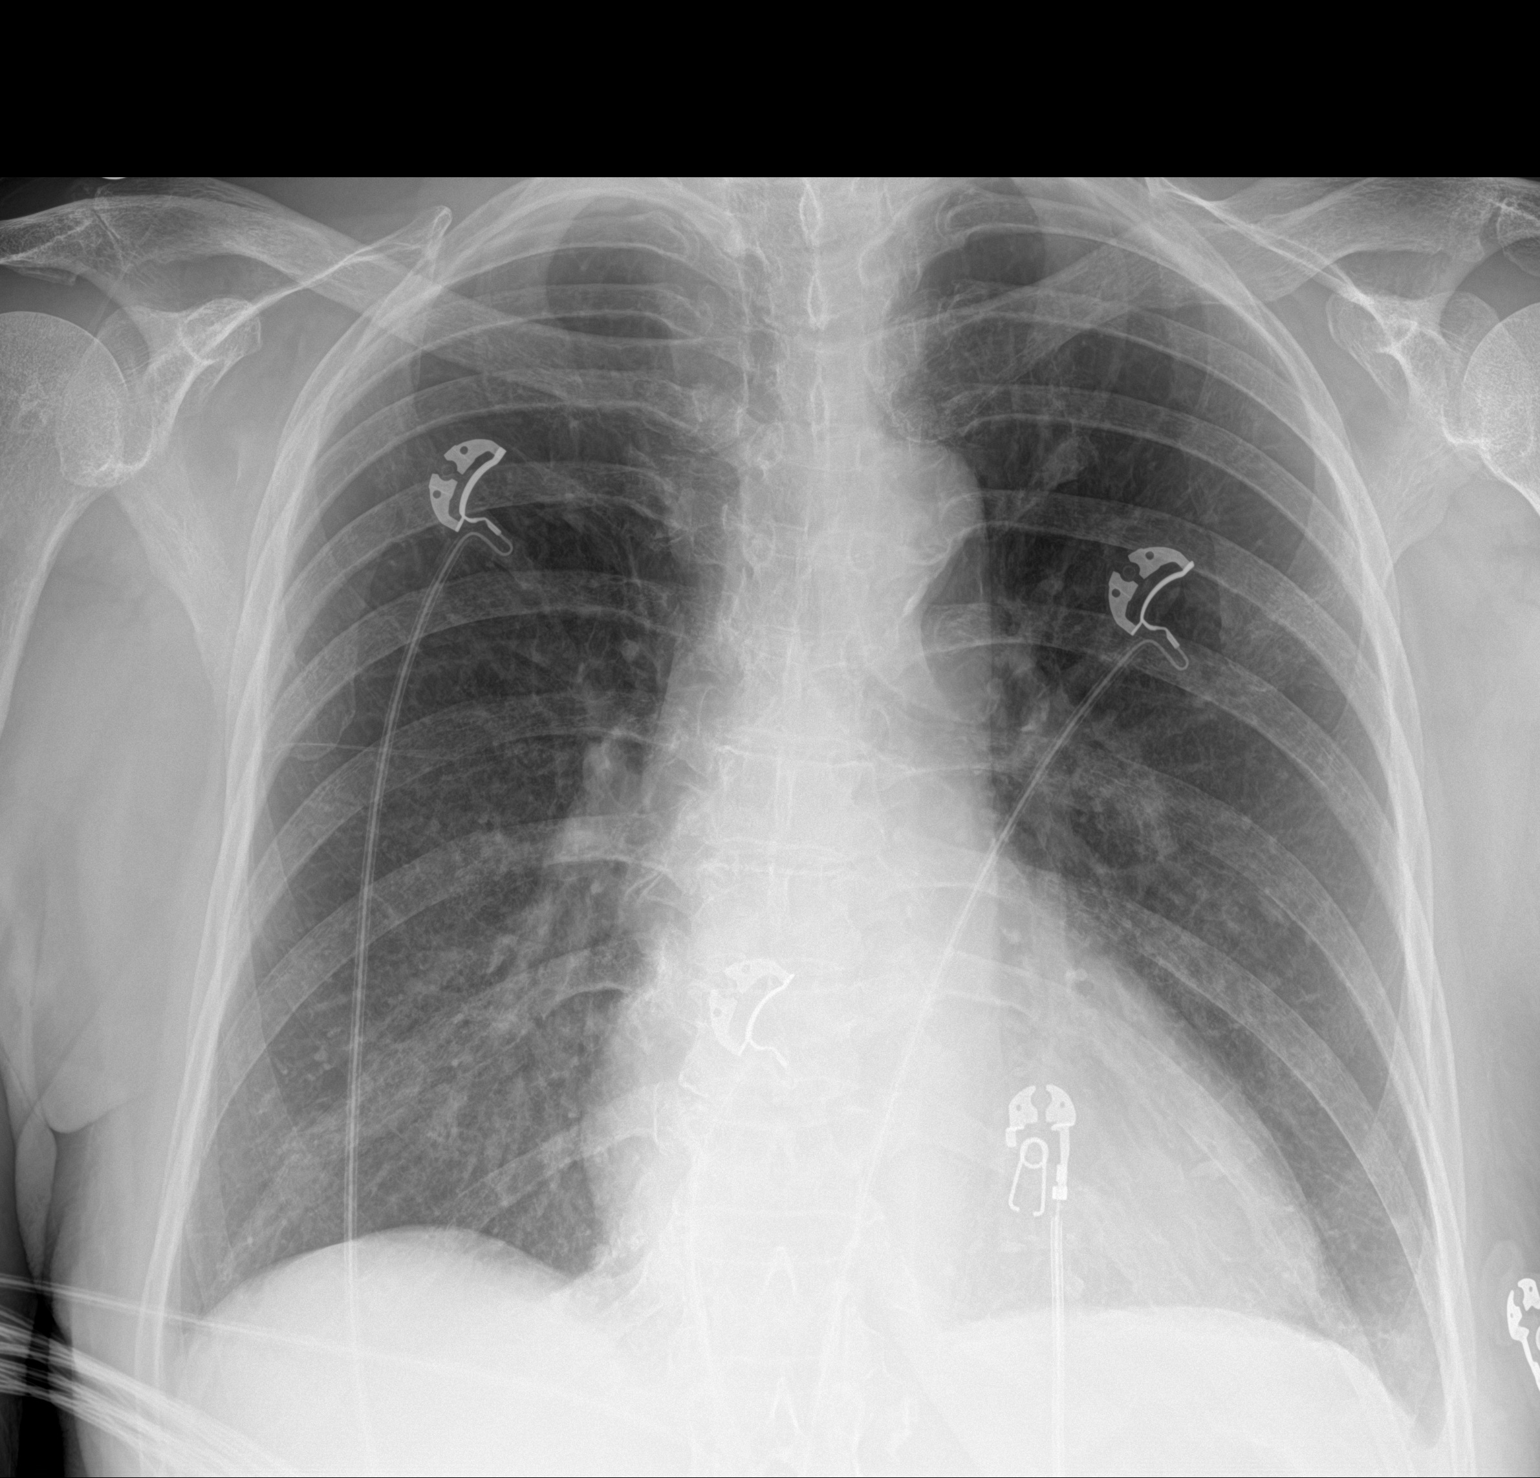

[1 of 1 positions shown; findings below may reference images not displayed]

FINDINGS: The heart is enlarged. There are few linear opacities at the lung
bases. No pneumothorax. No large pleural effusion. No acute osseous
abnormality. There is an old healed left clavicle fracture.
IMPRESSION: 1. Mild stable cardiomegaly.
2. No acute cardiopulmonary process. Previously noted ground-glass
airspace opacities are better visualized on prior CT

## 2021-04-30 ENCOUNTER — Telehealth: Payer: Self-pay

## 2021-04-30 DIAGNOSIS — K862 Cyst of pancreas: Secondary | ICD-10-CM

## 2021-04-30 NOTE — Telephone Encounter (Signed)
Spoke with patient's husband to remind him that patient is due for repeat MRI/MRCP to take a look at the pancreatic cyst. Advised that I will place the order and radiology scheduling will contact them to set up the patient's appt. Patient's husband verbalized understanding and had no concerns at the end of the call.  Spoke with Kerri Perches at [facility], she confirmed that order was received and they will contact the patient to set up her appt.

## 2021-04-30 NOTE — Telephone Encounter (Signed)
-----   Message from Yevette Edwards, RN sent at 08/23/2020  1:42 PM EST ----- Regarding: MRCP MRI/MRCP - pancreatic cyst  See 08/23/2020 telephone encounter for more information

## 2021-05-01 ENCOUNTER — Ambulatory Visit: Payer: Medicare Other | Attending: Neurology

## 2021-05-01 ENCOUNTER — Other Ambulatory Visit: Payer: Self-pay

## 2021-05-01 DIAGNOSIS — M6281 Muscle weakness (generalized): Secondary | ICD-10-CM | POA: Diagnosis not present

## 2021-05-01 DIAGNOSIS — R2689 Other abnormalities of gait and mobility: Secondary | ICD-10-CM | POA: Diagnosis present

## 2021-05-01 NOTE — Therapy (Signed)
Outpatient Physical Therapy  Note Patient Name: Emily Cannon MRN: [id number] DOB:02-07-1933, 87 y.o., female Today's Date: 05/01/2021   PT End of Session - 05/01/21 1453     Visit Number 35    Number of Visits 40    Date for PT Re-Evaluation 06/13/21    Authorization Type 10 v PN on 11/22/20; Recert on 0/35/46-5/68/12 (2x/week for 12 sessions), 10 v PN on 7/51/70; recert 0/17/49 to 10/31/94 (1x/week for 6 sessions); re cert 01/30/90 to 63/84/66 for 9 sessions in 10 weeks    Progress Note Due on Visit 40    PT Start Time 1445    PT Stop Time 1530    PT Time Calculation (min) 45 min    Equipment Utilized During Treatment Gait belt    Activity Tolerance Patient tolerated treatment well    Behavior During Therapy WFL for tasks assessed/performed              PCP: Eulas Post, MD Referring Provier: Marcial Pacas, MD  Referring Diagnosis: R26.9 (ICD-10-CM) - Gait abnormality M35.9,G63 (ICD-10-CM) - 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 ([place] Visit Diagnosis: Muscle weakness (generalized)  Other abnormalities of gait and mobility  Past Medical History:  Diagnosis Date   Allergic rhinitis, cause unspecified    Allergy    Anxiety state, unspecified    Blood transfusion without reported diagnosis    2020   Cataract    removed both eyes    Chronic kidney disease    Disorder of bone and cartilage, unspecified    Esophageal candidiasis (HCC)    Foot drop    Gastric ulcer    History of CMV    History of Helicobacter pylori infection    Hypothyroid    Irritable bowel syndrome    Lumbago    Microscopic polyangiitis (HCC)    Other and unspecified hyperlipidemia    Perforation of tympanic membrane, unspecified    Unspecified essential hypertension    Past Surgical History:  Procedure Laterality Date   ABDOMINAL HYSTERECTOMY     BIOPSY  01/02/2019   Procedure: BIOPSY;  Surgeon: Yetta Flock, MD;  Location: [facility] ENDOSCOPY;  Service:  Gastroenterology;;   cataract surgery  5/12 and 12/24/2012   both eyes   COLONOSCOPY     COLONOSCOPY WITH PROPOFOL N/A 01/02/2019   Procedure: COLONOSCOPY WITH PROPOFOL;  Surgeon: Yetta Flock, MD;  Location: [place];  Service: Gastroenterology;  Laterality: N/A;   ESOPHAGOGASTRODUODENOSCOPY (EGD) WITH PROPOFOL N/A 01/02/2019   Procedure: ESOPHAGOGASTRODUODENOSCOPY (EGD) WITH PROPOFOL;  Surgeon: Yetta Flock, MD;  Location: [place];  Service: Gastroenterology;  Laterality: N/A;   NSVD     x1   POLYPECTOMY  01/02/2019   Procedure: POLYPECTOMY;  Surgeon: Yetta Flock, MD;  Location: [facility] ENDOSCOPY;  Service: Gastroenterology;;   POLYPECTOMY     TONSILLECTOMY     UPPER GASTROINTESTINAL ENDOSCOPY     Patient Active Problem List   Diagnosis Date Noted   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 ([place]) 09/25/2020   Peripheral neuropathy 05/08/2020   Weakness 03/28/2020   Gait abnormality 03/28/2020   Paresthesia 03/28/2020   Left foot drop 03/23/2020   Heme positive stool    Benign neoplasm of colon    Acute blood loss anemia 12/30/2018   Symptomatic anemia 12/15/2018   AKI (acute kidney injury) ([place]) 12/15/2018   Hypomagnesemia 12/15/2018   Steroid-induced hyperglycemia 12/15/2018   Microscopic polyangiitis ([place]) 11/13/2018   Positive P-ANCA titer 10/14/2018  Viral URI 05/12/2018   Syncope 06/05/2016   Unspecified venous (peripheral) insufficiency 11/23/2012   Dermatitis 11/10/2012   Hypothyroidism 05/22/2011   TYMPANIC MEMBRANE PERFORATION, LEFT EAR 08/08/2007   Osteopenia 07/27/2007   Hyperlipidemia 05/11/2007   Anxiety state 05/11/2007   Essential hypertension 05/11/2007   ALLERGIC RHINITIS 05/11/2007   Irritable bowel syndrome 05/11/2007   LOW BACK PAIN 05/11/2007   HYSTERECTOMY, HX OF 05/11/2007    SUBJECTIVE: I I am still using rollator more than cane at home.  Pain: is patient experiencing pain? No.    OBJECTIVE:  04/04/21  Objective tests: Modified CTSIB: Test 1: 30 sec Test 2: 30 sec Test 3: 30 sec Test 4: 30 sec Total: 120 sec/120 sec  6 MWT (01/05/21): 510 feet with st. Cane; 738 feet with st. cane Gait speed: 10 meter walk test: 0.62ms with st. Cane (01/05/21), 0.55 m/s with st. Cane (04/04/21) Modified CTSIB (02/09/21) 100 sec ; 120 sec 04/04/21  Gait speed: 0.671m without AD; 0.71 m/s with st. Cane (05/01/21)   Today's Treatment:  05/01/21 Gait training:  x 230 feet without AD cues for longer step length and arm sings Resisted gait training: yellow band around ankles with posterior pull: 1 x 345' without AD Box tap: 2nd step on stairs bil HHA with yellow band around ankles with posterior pull: 2 x 10 R and L Box tap: 1st step on stairs: no HHA: 2 x 10 R and L Side steps: yellow band around ankles: 6 x 10 R and L rest after 3 sets  04/24/21: Resisted gait training:  - Ball and Chain: with 10lb ankle weight strapped at unilateral ankle with theraband on floor and pt drags it behind her as she walks. Cues for longer step length to advance limb in front of other: 2 x 230' no AD, min a required at times for LOB and stability  - with black sport cord around waist and rehab aide provides resistance with posterior pull: 2 x 230' with same cues as above Pt education: pt educated to use st. Cane primarily in the house, use rollator at night and for community ambulation 04/18/21: Standing on foam with flat top: functional reaching  Bil OH reach: 10x  Toe touches: 10x Kicking 4 kg ball around cones placed in zigzag pattern 5 cones, 2x no AD Marching on trampoline: 20x no HHA Marching with one HHA: 10x R and L Practiced cooking scrambled eggs. Pt able to get things out of refrigerator, place it on countertop, bend to get pots and pans out, cook for 15'. Pt required SBA. Needed cues to not reach too far but step closer to minimize reaching.    Home Exercise Program: Access Code: [email or web address]:  [email or web address] Date: 10/13/2020 Prepared by: KaMarkus JarvisExercises Straight Leg Raise - 1 x daily - 7 x weekly - 2 sets - 10 reps Supine Bridge - 1 x daily - 7 x weekly - 2 sets - 10 reps Sidelying Hip Abduction - 1 x daily - 7 x weekly - 2 sets - 10 reps Seated Knee Extension with Resistance - 1 x daily - 7 x weekly - 2 sets - 10 reps Seated Hamstring Curl with Anchored Resistance - 1 x daily - 7 x weekly - 2 sets - 10 reps    Assessment: Clinical impression:  Met all short term goals today. Significant improvement in gait speed with cane compared to last session.  Rehab potential: Good   Clinical decision making: Stable/uncomplicated  Evaluation complexity: Moderate     Goals: Goals reviewed with patient? Yes   SHORT TERM GOALS:   STG Name Target Date Goal status  1 Pt will be be able to ambulate 1200' with RW to improve walking endurance Comments: Eval = 693' with RW; 1527' with RW (11/22/20) 11/03/20 Goal met   2 Pt will demo compliance with HEP to improve self management of symptoms. Comments: Eval = not issued; compliant 11/03/20 Goal met  3 Pt will get a shower chair and be able to sit and pivot into tub with min A from her husband. Got shower chair on 11/21/20 11/03/20 Goal met  4 Pt will demo 0.71ms improvement in walking speed in 4 weeks to improve community ambulation 0.5335m with st. CaKasandra Knudsen/7/22; 0.625 m/s without AD (05/01/21); 0.71 m/s with st. cane 05/02/21 Revised 04/04/21  Goal met    LONG TERM GOALS:    LTG Name Target Date Goal status  1 Pt will be able to stand up from chair without HHA and without bracing knees against chair/bed to improve strength in bil LE Comments: Eval = needs to brace knees against chair to stand up; able to stand up but rquires CGA; able to stand without bracing knees (12/15/20) 12/01/20 Goal met 12/15/20  2 Pt will be able to ambulate >2000' with RW and without rest break to improve walking endurance Comments: Eval =  693' with RW;11/22/20 1527; 12/01/20 Goal met 12/15/20  3 Pt will be able to ambulate at least 800' with LRAD and without rest break to improve functional ambulation Comments: Eval = 693' with RW without rest break, 874' with st. Cane with CGA, 1175' with st. Cane with CGA (12/13/20); 1050' with st cane and CGA (12/15/20); 1050' with st. Cane and SBA/CGA; 1050' with st. Cane SBA 02/09/21   Goal met  02/09/21  4 Pt will be able to don/doff her shoes/braces off at EOB to improve indepence. Comments: Eval =  Husband helps her with getting her shoes and braces and puts them on for her. Pt able to don/doff shoes but due to short legs her legs dangle and pt doesn't feel comfortable. 02/09/21 Not met  5.  Pt will demo 120/120 sec on Modified CTSIB to improve standing balance on uneven surfaces 100/120 sec (02/09/21); 120/120 sec (04/04/21) 04/06/21 (revised 02/09/21) Goal met 04/04/21  6.  Pt will be able to stand on SLS for at least 8 seconds to improve curb negotiations  3 sec bil (02/09/21); 4 sec on L and 5 sec on R (04/04/21) 06/13/21  (updated 04/06/21) Progressing continue  7. Pt will demo 0.35m64mgait speed with st. Cane to confidentally access community negotiations 0.41m82mith st. CaneKasandra Knudsen/22 06/13/21 Revised 04/04/21 Progressing continue    PLAN:   PT frequency: 1x/week   PT duration: 9 sessions in 10 weeks   Planned Interventions: Therapeutic exercises, Therapeutic activity, Neuro Muscular re-education, Balance training, Gait training, Patient/Family education, Joint mobilization, Stair training, Orthotic/Fit training, Cryotherapy and Moist heat   Plan for next session: Work on SLS, non compliant surface EC   KarmKerrie Pleasure 05/01/2021, [address], [place]: [phone]:  [phone] name: ClarJARIYA REICHOW: [id number]: [birth year]

## 2021-05-09 ENCOUNTER — Ambulatory Visit: Payer: Medicare Other

## 2021-05-09 ENCOUNTER — Other Ambulatory Visit: Payer: Self-pay

## 2021-05-09 DIAGNOSIS — M6281 Muscle weakness (generalized): Secondary | ICD-10-CM | POA: Diagnosis not present

## 2021-05-09 DIAGNOSIS — R2689 Other abnormalities of gait and mobility: Secondary | ICD-10-CM

## 2021-05-09 NOTE — Therapy (Signed)
Outpatient Physical Therapy  Note Patient Name: Emily Cannon MRN: [id number] DOB:[birth year], 87 y.o., female Today's Date: 05/09/2021   PT End of Session - 05/09/21 1503     Visit Number 36    Number of Visits 40    Date for PT Re-Evaluation 06/13/21    Authorization Type 10 v PN on 11/22/20; Recert on 9/47/65-4/65/03 (2x/week for 12 sessions), 10 v PN on 5/46/56; recert 03/09/74 to 08/04/98 (1x/week for 6 sessions); re cert 08/05/47 to 44/96/75 for 9 sessions in 10 weeks    Progress Note Due on Visit 40    PT Start Time 1445    PT Stop Time 1530    PT Time Calculation (min) 45 min    Equipment Utilized During Treatment Gait belt    Activity Tolerance Patient tolerated treatment well    Behavior During Therapy WFL for tasks assessed/performed              PCP: Eulas Post, MD Referring Provier: Marcial Pacas, MD  Referring Diagnosis: R26.9 (ICD-10-CM) - Gait abnormality M35.9,G63 (ICD-10-CM) - 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 ([place] Visit Diagnosis: Muscle weakness (generalized)  Other abnormalities of gait and mobility  Past Medical History:  Diagnosis Date   Allergic rhinitis, cause unspecified    Allergy    Anxiety state, unspecified    Blood transfusion without reported diagnosis    2020   Cataract    removed both eyes    Chronic kidney disease    Disorder of bone and cartilage, unspecified    Esophageal candidiasis (HCC)    Foot drop    Gastric ulcer    History of CMV    History of Helicobacter pylori infection    Hypothyroid    Irritable bowel syndrome    Lumbago    Microscopic polyangiitis (HCC)    Other and unspecified hyperlipidemia    Perforation of tympanic membrane, unspecified    Unspecified essential hypertension    Past Surgical History:  Procedure Laterality Date   ABDOMINAL HYSTERECTOMY     BIOPSY  01/02/2019   Procedure: BIOPSY;  Surgeon: Yetta Flock, MD;  Location: [facility] ENDOSCOPY;  Service:  Gastroenterology;;   cataract surgery  5/12 and 12/24/2012   both eyes   COLONOSCOPY     COLONOSCOPY WITH PROPOFOL N/A 01/02/2019   Procedure: COLONOSCOPY WITH PROPOFOL;  Surgeon: Yetta Flock, MD;  Location: [facility];  Service: Gastroenterology;  Laterality: N/A;   ESOPHAGOGASTRODUODENOSCOPY (EGD) WITH PROPOFOL N/A 01/02/2019   Procedure: ESOPHAGOGASTRODUODENOSCOPY (EGD) WITH PROPOFOL;  Surgeon: Yetta Flock, MD;  Location: [facility];  Service: Gastroenterology;  Laterality: N/A;   NSVD     x1   POLYPECTOMY  01/02/2019   Procedure: POLYPECTOMY;  Surgeon: Yetta Flock, MD;  Location: [facility] ENDOSCOPY;  Service: Gastroenterology;;   POLYPECTOMY     TONSILLECTOMY     UPPER GASTROINTESTINAL ENDOSCOPY     Patient Active Problem List   Diagnosis Date Noted   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 ([place]) 09/25/2020   Peripheral neuropathy 05/08/2020   Weakness 03/28/2020   Gait abnormality 03/28/2020   Paresthesia 03/28/2020   Left foot drop 03/23/2020   Heme positive stool    Benign neoplasm of colon    Acute blood loss anemia 12/30/2018   Symptomatic anemia 12/15/2018   AKI (acute kidney injury) ([place]) 12/15/2018   Hypomagnesemia 12/15/2018   Steroid-induced hyperglycemia 12/15/2018   Microscopic polyangiitis ([place]) 11/13/2018   Positive P-ANCA titer 10/14/2018  Viral URI 05/12/2018   Syncope 06/05/2016   Unspecified venous (peripheral) insufficiency 11/23/2012   Dermatitis 11/10/2012   Hypothyroidism 05/22/2011   TYMPANIC MEMBRANE PERFORATION, LEFT EAR 08/08/2007   Osteopenia 07/27/2007   Hyperlipidemia 05/11/2007   Anxiety state 05/11/2007   Essential hypertension 05/11/2007   ALLERGIC RHINITIS 05/11/2007   Irritable bowel syndrome 05/11/2007   LOW BACK PAIN 05/11/2007   HYSTERECTOMY, HX OF 05/11/2007    SUBJECTIVE: I hurt my R shoulder pulling on something yesterday so it is sore today  Pain: is patient experiencing pain? No.     OBJECTIVE:  04/04/21 Objective tests: Modified CTSIB: Test 1: 30 sec Test 2: 30 sec Test 3: 30 sec Test 4: 30 sec Total: 120 sec/120 sec  6 MWT (01/05/21): 510 feet with st. Cane; 738 feet with st. cane Gait speed: 10 meter walk test: 0.8ms with st. Cane (01/05/21), 0.55 m/s with st. Cane (04/04/21) Modified CTSIB (02/09/21) 100 sec ; 120 sec 04/04/21  Gait speed: 0.619m without AD; 0.71 m/s with st. Cane (05/01/21)   Today's Treatment:  05/09/21: Gait training: Biodex harness for safety, 100% weight bearing,  1.0 mph for 2', tried no HHA to 1 HHA, but patient is guarded with walking due to inreased fear 1.86m63mfor 4' with bil HHA, cues for longer step length on R and to improve heel strike on L (pt has forefoot contact on L during heel strike) Rest 1.86mp21mor 4' with bil HHA and 5% incline, same cues as above. 1.6mph50mr 4' with bil HHA and 0% incline, same cues as above  Rest 1.6mph 27m 4' with bil HHA and 5% incline, same cues as above 2.0mph f43m2' with bil HHA and 0% incline, same cues as above. HR at end of the walk: 67bpm  05/01/21 Gait training:  x 230 feet without AD cues for longer step length and arm sings Resisted gait training: yellow band around ankles with posterior pull: 1 x 345' without AD Box tap: 2nd step on stairs bil HHA with yellow band around ankles with posterior pull: 2 x 10 R and L Box tap: 1st step on stairs: no HHA: 2 x 10 R and L Side steps: yellow band around ankles: 6 x 10 R and L rest after 3 sets  04/24/21: Resisted gait training:  - Ball and Chain: with 10lb ankle weight strapped at unilateral ankle with theraband on floor and pt drags it behind her as she walks. Cues for longer step length to advance limb in front of other: 2 x 230' no AD, min a required at times for LOB and stability  - with black sport cord around waist and rehab aide provides resistance with posterior pull: 2 x 230' with same cues as above Pt education: pt educated to use st.  Cane primarily in the house, use rollator at night and for community ambulation 04/18/21: Standing on foam with flat top: functional reaching  Bil OH reach: 10x  Toe touches: 10x Kicking 4 kg ball around cones placed in zigzag pattern 5 cones, 2x no AD Marching on trampoline: 20x no HHA Marching with one HHA: 10x R and L Practiced cooking scrambled eggs. Pt able to get things out of refrigerator, place it on countertop, bend to get pots and pans out, cook for 15'. Pt required SBA. Needed cues to not reach too far but step closer to minimize reaching.    Home Exercise Program: Access Code: [email or web address] Date: 10/13/2020 Prepared by: KarmeshMarkus Jarvis  Exercises Straight Leg Raise - 1 x daily - 7 x weekly - 2 sets - 10 reps Supine Bridge - 1 x daily - 7 x weekly - 2 sets - 10 reps Sidelying Hip Abduction - 1 x daily - 7 x weekly - 2 sets - 10 reps Seated Knee Extension with Resistance - 1 x daily - 7 x weekly - 2 sets - 10 reps Seated Hamstring Curl with Anchored Resistance - 1 x daily - 7 x weekly - 2 sets - 10 reps    Assessment: Clinical impression:  Today's session was focused on progressing her gait training to treadmill to increase gait speed and coordination. Patient was not comfortable with walking with no HHA or 1 HHA on treadmill today. We progressed gait training with moderate intensity with increased speed and increased incline.  Rehab potential: Good   Clinical decision making: Stable/uncomplicated   Evaluation complexity: Moderate     Goals: Goals reviewed with patient? Yes   SHORT TERM GOALS:   STG Name Target Date Goal status  1 Pt will be be able to ambulate 1200' with RW to improve walking endurance Comments: Eval = 693' with RW; 1527' with RW (11/22/20) 11/03/20 Goal met   2 Pt will demo compliance with HEP to improve self management of symptoms. Comments: Eval = not issued; compliant 11/03/20 Goal met  3 Pt will get a shower  chair and be able to sit and pivot into tub with min A from her husband. Got shower chair on 11/21/20 11/03/20 Goal met  4 Pt will demo 0.83ms improvement in walking speed in 4 weeks to improve community ambulation 0.527m with st. CaKasandra Knudsen/7/22; 0.625 m/s without AD (05/01/21); 0.71 m/s with st. cane 05/02/21 Revised 04/04/21  Goal met    LONG TERM GOALS:    LTG Name Target Date Goal status  1 Pt will be able to stand up from chair without HHA and without bracing knees against chair/bed to improve strength in bil LE Comments: Eval = needs to brace knees against chair to stand up; able to stand up but rquires CGA; able to stand without bracing knees (12/15/20) 12/01/20 Goal met 12/15/20  2 Pt will be able to ambulate >2000' with RW and without rest break to improve walking endurance Comments: Eval = 693' with RW;11/22/20 1527; 12/01/20 Goal met 12/15/20  3 Pt will be able to ambulate at least 800' with LRAD and without rest break to improve functional ambulation Comments: Eval = 693' with RW without rest break, 874' with st. Cane with CGA, 1175' with st. Cane with CGA (12/13/20); 1050' with st cane and CGA (12/15/20); 1050' with st. Cane and SBA/CGA; 1050' with st. Cane SBA 02/09/21   Goal met  02/09/21  4 Pt will be able to don/doff her shoes/braces off at EOB to improve indepence. Comments: Eval =  Husband helps her with getting her shoes and braces and puts them on for her. Pt able to don/doff shoes but due to short legs her legs dangle and pt doesn't feel comfortable. 02/09/21 Not met  5.  Pt will demo 120/120 sec on Modified CTSIB to improve standing balance on uneven surfaces 100/120 sec (02/09/21); 120/120 sec (04/04/21) 04/06/21 (revised 02/09/21) Goal met 04/04/21  6.  Pt will be able to stand on SLS for at least 8 seconds to improve curb negotiations  3 sec bil (02/09/21); 4 sec on L and 5 sec on R (04/04/21) 06/13/21  (updated 04/06/21) Progressing continue  7.  Pt will demo 0.10ms gait speed with st. Cane to  confidentally access community negotiations 0.56m with st. CaKasandra Knudsen/7/22 06/13/21 Revised 04/04/21 Progressing continue    PLAN:   PT frequency: 1x/week   PT duration: 9 sessions in 10 weeks   Planned Interventions: Therapeutic exercises, Therapeutic activity, Neuro Muscular re-education, Balance training, Gait training, Patient/Family education, Joint mobilization, Stair training, Orthotic/Fit training, Cryotherapy and Moist heat   Plan for next session: Treadmill for gait training, continue to progress to 1 HHA if comfortable, increase speed and incline as tolerated   KaKerrie PleasurePT 05/09/2021, [id number]u[place]hone: [phone] Fax:  [phone] name: Emily SCRIVENSRN: [id number]: [birth year]

## 2021-05-11 IMAGING — CR PORTABLE CHEST - 1 VIEW
1 series · 1 of 1 positions shown · non-contrast
Comparison: 12/15/2018.

CLINICAL DATA: Week with shortness of breath. Hypertension. Acute
kidney injury.

EXAM:
PORTABLE CHEST 1 VIEW

[AP]
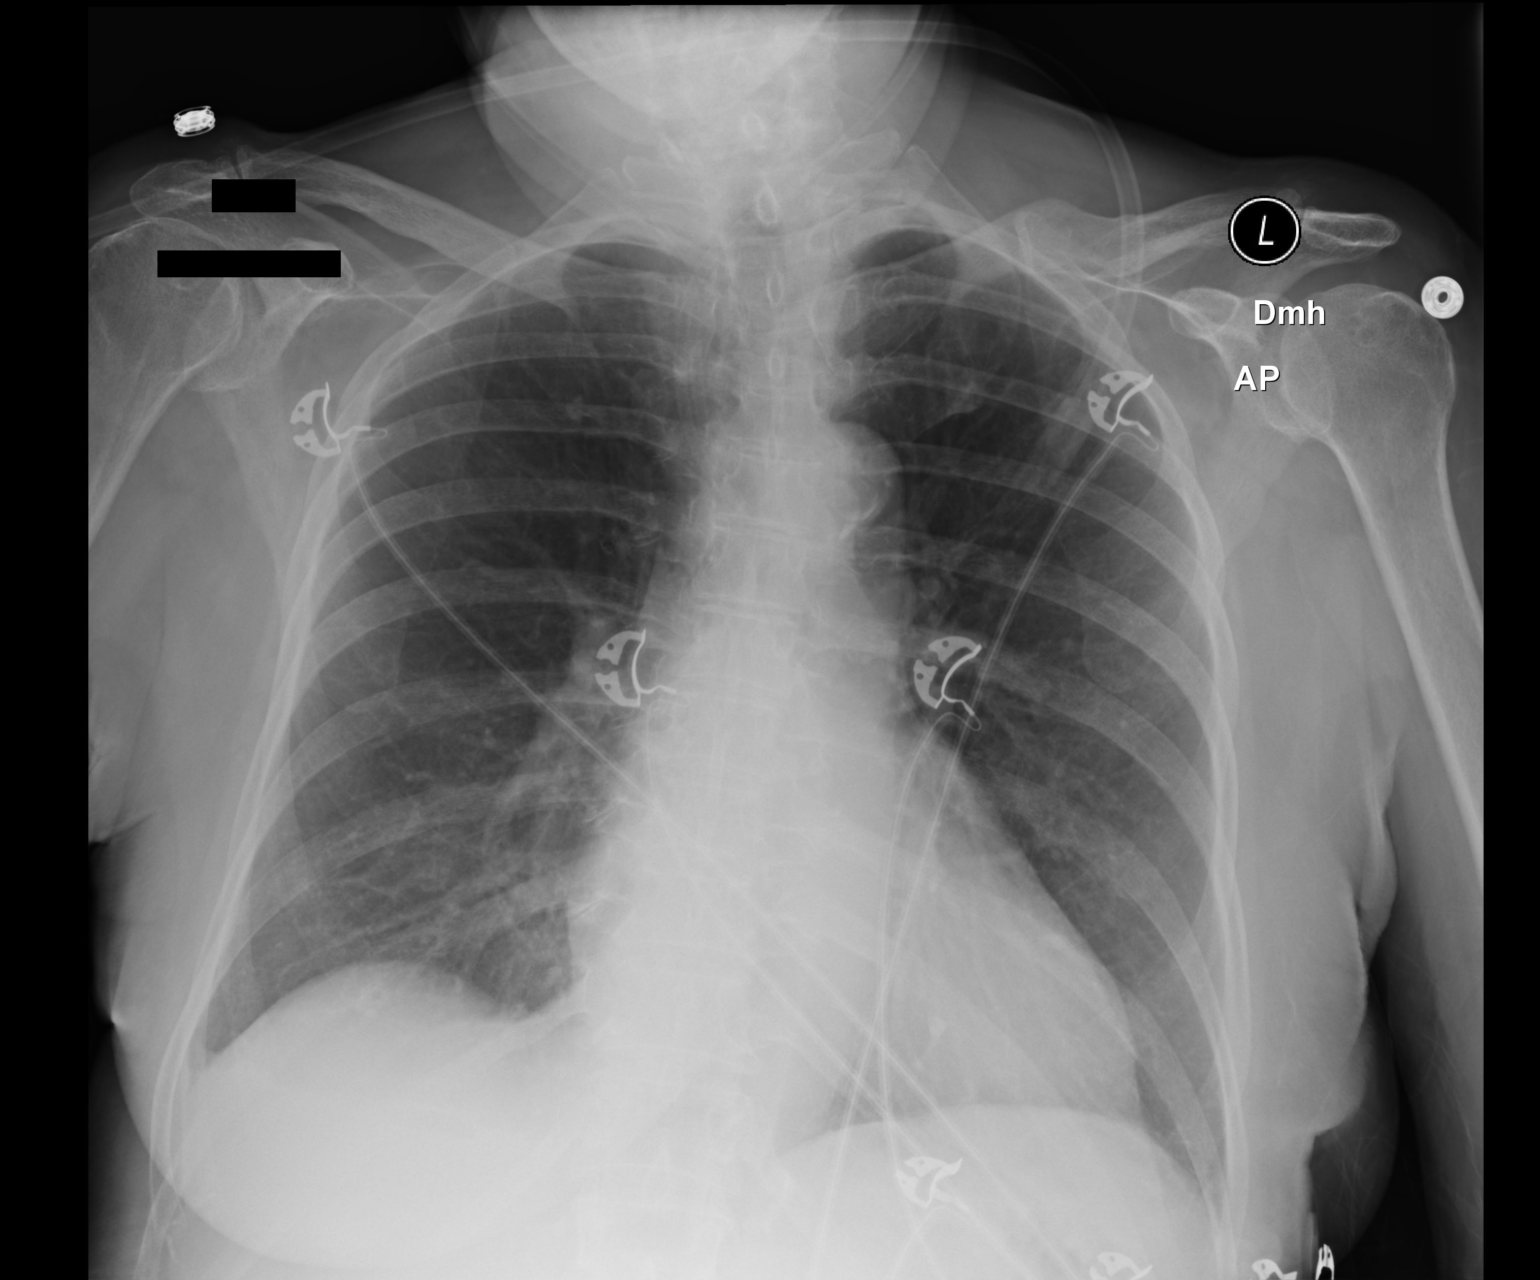

[1 of 1 positions shown; findings below may reference images not displayed]

FINDINGS: Heart size upper limits normal. No consolidation or edema. Calcified
tortuous aorta. No effusion or pneumothorax. Improved cardiac size
from priors.
IMPRESSION: No active disease.

## 2021-05-15 ENCOUNTER — Ambulatory Visit: Payer: Medicare Other | Attending: Internal Medicine

## 2021-05-15 ENCOUNTER — Encounter (HOSPITAL_COMMUNITY): Payer: Self-pay

## 2021-05-15 ENCOUNTER — Other Ambulatory Visit (HOSPITAL_BASED_OUTPATIENT_CLINIC_OR_DEPARTMENT_OTHER): Payer: Self-pay

## 2021-05-15 DIAGNOSIS — Z23 Encounter for immunization: Secondary | ICD-10-CM

## 2021-05-15 MED ORDER — PFIZER COVID-19 VAC BIVALENT 30 MCG/0.3ML IM SUSP
INTRAMUSCULAR | 0 refills | Status: DC
  Filled 2021-05-15: qty 0.3, 1d supply, fill #0

## 2021-05-15 NOTE — Progress Notes (Signed)
   Covid-19 Vaccination Clinic  Name:  Emily Cannon    MRN: [id number] DOB: January 11, 1933  05/15/2021  [name] was observed post Covid-19 immunization for 15 minutes without incident. She was provided with Vaccine Information Sheet and instruction to access the V-Safe system.   [name] was instructed to call 911 with any severe reactions post vaccine: Difficulty breathing  Swelling of face and throat  A fast heartbeat  A bad rash all over body  Dizziness and weakness

## 2021-05-16 ENCOUNTER — Other Ambulatory Visit: Payer: Self-pay

## 2021-05-16 ENCOUNTER — Ambulatory Visit: Payer: Medicare Other

## 2021-05-16 DIAGNOSIS — R2689 Other abnormalities of gait and mobility: Secondary | ICD-10-CM

## 2021-05-16 DIAGNOSIS — M6281 Muscle weakness (generalized): Secondary | ICD-10-CM

## 2021-05-16 NOTE — Therapy (Signed)
Outpatient Physical Therapy  Note Patient Name: Emily Cannon MRN: [id number] DOB:[birth year], 87 y.o., female Today's Date: 05/16/2021   PT End of Session - 05/16/21 1458     Visit Number 37    Number of Visits 40    Date for PT Re-Evaluation 06/13/21    Authorization Type 10 v PN on 11/22/20; Recert on 3/70/96-4/38/38 (2x/week for 12 sessions), 10 v PN on 1/84/03; recert 7/54/36 to 0/6/77 (1x/week for 6 sessions); re cert 0/3/40 to 35/24/81 for 9 sessions in 10 weeks    Progress Note Due on Visit 40    PT Start Time 1445    PT Stop Time 1530    PT Time Calculation (min) 45 min    Equipment Utilized During Treatment Gait belt    Activity Tolerance Patient tolerated treatment well    Behavior During Therapy WFL for tasks assessed/performed              PCP: Eulas Post, MD Referring Provier: Marcial Pacas, MD  Referring Diagnosis: R26.9 (ICD-10-CM) - Gait abnormality M35.9,G63 (ICD-10-CM) - 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 ([place] Visit Diagnosis: Muscle weakness (generalized)  Other abnormalities of gait and mobility  Past Medical History:  Diagnosis Date   Allergic rhinitis, cause unspecified    Allergy    Anxiety state, unspecified    Blood transfusion without reported diagnosis    2020   Cataract    removed both eyes    Chronic kidney disease    Disorder of bone and cartilage, unspecified    Esophageal candidiasis (HCC)    Foot drop    Gastric ulcer    History of CMV    History of Helicobacter pylori infection    Hypothyroid    Irritable bowel syndrome    Lumbago    Microscopic polyangiitis (HCC)    Other and unspecified hyperlipidemia    Perforation of tympanic membrane, unspecified    Unspecified essential hypertension    Past Surgical History:  Procedure Laterality Date   ABDOMINAL HYSTERECTOMY     BIOPSY  01/02/2019   Procedure: BIOPSY;  Surgeon: Yetta Flock, MD;  Location: [facility] ENDOSCOPY;  Service:  Gastroenterology;;   cataract surgery  5/12 and 12/24/2012   both eyes   COLONOSCOPY     COLONOSCOPY WITH PROPOFOL N/A 01/02/2019   Procedure: COLONOSCOPY WITH PROPOFOL;  Surgeon: Yetta Flock, MD;  Location: [place];  Service: Gastroenterology;  Laterality: N/A;   ESOPHAGOGASTRODUODENOSCOPY (EGD) WITH PROPOFOL N/A 01/02/2019   Procedure: ESOPHAGOGASTRODUODENOSCOPY (EGD) WITH PROPOFOL;  Surgeon: Yetta Flock, MD;  Location: [facility];  Service: Gastroenterology;  Laterality: N/A;   NSVD     x1   POLYPECTOMY  01/02/2019   Procedure: POLYPECTOMY;  Surgeon: Yetta Flock, MD;  Location: [facility] ENDOSCOPY;  Service: Gastroenterology;;   POLYPECTOMY     TONSILLECTOMY     UPPER GASTROINTESTINAL ENDOSCOPY     Patient Active Problem List   Diagnosis Date Noted   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 ([place]) 09/25/2020   Peripheral neuropathy 05/08/2020   Weakness 03/28/2020   Gait abnormality 03/28/2020   Paresthesia 03/28/2020   Left foot drop 03/23/2020   Heme positive stool    Benign neoplasm of colon    Acute blood loss anemia 12/30/2018   Symptomatic anemia 12/15/2018   AKI (acute kidney injury) ([place]) 12/15/2018   Hypomagnesemia 12/15/2018   Steroid-induced hyperglycemia 12/15/2018   Microscopic polyangiitis ([place]) 11/13/2018   Positive P-ANCA titer 10/14/2018  Viral URI 05/12/2018   Syncope 06/05/2016   Unspecified venous (peripheral) insufficiency 11/23/2012   Dermatitis 11/10/2012   Hypothyroidism 05/22/2011   TYMPANIC MEMBRANE PERFORATION, LEFT EAR 08/08/2007   Osteopenia 07/27/2007   Hyperlipidemia 05/11/2007   Anxiety state 05/11/2007   Essential hypertension 05/11/2007   ALLERGIC RHINITIS 05/11/2007   Irritable bowel syndrome 05/11/2007   LOW BACK PAIN 05/11/2007   HYSTERECTOMY, HX OF 05/11/2007    SUBJECTIVE: I hurt my R shoulder pulling on something yesterday so it is sore today  Pain: is patient experiencing pain? No.     OBJECTIVE:  04/04/21 Objective tests: Modified CTSIB: Test 1: 30 sec Test 2: 30 sec Test 3: 30 sec Test 4: 30 sec Total: 120 sec/120 sec  6 MWT (01/05/21): 510 feet with st. Cane; 738 feet with st. cane Gait speed: 10 meter walk test: 0.65ms with st. Cane (01/05/21), 0.55 m/s with st. Cane (04/04/21) Modified CTSIB (02/09/21) 100 sec ; 120 sec 04/04/21  Gait speed: 0.695m without AD; 0.71 m/s with st. Cane (05/01/21)   Today's Treatment:  05/16/21: Gait training: Biodex harness for safety, 100% weight bearing, bil HHA 0.9 to 1.0 mph for 5' (0% incline) Rest 1.2 mph for 3' and then 1.67m60mfor another 3' (0% incline) Rest BP 140/52 and 131/51 with subsequent trials 1.67mp3mor 3' and then 1.8 mph for another 3' (0% incline) Rest 1.8mph1mr 2' and 2.0mph 73m 2' Rest  05/09/21: Gait training: Biodex harness for safety, 100% weight bearing,  1.0 mph for 2', tried no HHA to 1 HHA, but patient is guarded with walking due to inreased fear 1.67mph f36m4' with bil HHA, cues for longer step length on R and to improve heel strike on L (pt has forefoot contact on L during heel strike) Rest 1.67mph fo39m' with bil HHA and 5% incline, same cues as above. 1.6mph for23m with bil HHA and 0% incline, same cues as above  Rest 1.6mph for 100mwith bil HHA and 5% incline, same cues as above 2.0mph for 286mith bil HHA and 0% incline, same cues as above. HR at end of the walk: 67bpm  05/01/21 Gait training:  x 230 feet without AD cues for longer step length and arm sings Resisted gait training: yellow band around ankles with posterior pull: 1 x 345' without AD Box tap: 2nd step on stairs bil HHA with yellow band around ankles with posterior pull: 2 x 10 R and L Box tap: 1st step on stairs: no HHA: 2 x 10 R and L Side steps: yellow band around ankles: 6 x 10 R and L rest after 3 sets  04/24/21: Resisted gait training:  - Ball and Chain: with 10lb ankle weight strapped at unilateral ankle with  theraband on floor and pt drags it behind her as she walks. Cues for longer step length to advance limb in front of other: 2 x 230' no AD, min a required at times for LOB and stability  - with black sport cord around waist and rehab aide provides resistance with posterior pull: 2 x 230' with same cues as above Pt education: pt educated to use st. Cane primarily in the house, use rollator at night and for community ambulation 04/18/21: Standing on foam with flat top: functional reaching  Bil OH reach: 10x  Toe touches: 10x Kicking 4 kg ball around cones placed in zigzag pattern 5 cones, 2x no AD Marching on trampoline: 20x no HHA Marching with one HHA: 10x  R and L Practiced cooking scrambled eggs. Pt able to get things out of refrigerator, place it on countertop, bend to get pots and pans out, cook for 15'. Pt required SBA. Needed cues to not reach too far but step closer to minimize reaching.    Home Exercise Program: Access Code: [email or web address] URL: [email or web address] Date: 10/13/2020 Prepared by: Markus Jarvis  Exercises Straight Leg Raise - 1 x daily - 7 x weekly - 2 sets - 10 reps Supine Bridge - 1 x daily - 7 x weekly - 2 sets - 10 reps Sidelying Hip Abduction - 1 x daily - 7 x weekly - 2 sets - 10 reps Seated Knee Extension with Resistance - 1 x daily - 7 x weekly - 2 sets - 10 reps Seated Hamstring Curl with Anchored Resistance - 1 x daily - 7 x weekly - 2 sets - 10 reps    Assessment: Clinical impression:  Ttoday's session focused on working on improving gait speed and improving heel strike. Pt able to ambulate at increased cadence compared to last session  Rehab potential: Good   Clinical decision making: Stable/uncomplicated   Evaluation complexity: Moderate     Goals: Goals reviewed with patient? Yes   SHORT TERM GOALS:   STG Name Target Date Goal status  1 Pt will be be able to ambulate 1200' with RW to improve walking endurance Comments: Eval = 693'  with RW; 1527' with RW (11/22/20) 11/03/20 Goal met   2 Pt will demo compliance with HEP to improve self management of symptoms. Comments: Eval = not issued; compliant 11/03/20 Goal met  3 Pt will get a shower chair and be able to sit and pivot into tub with min A from her husband. Got shower chair on 11/21/20 11/03/20 Goal met  4 Pt will demo 0.73ms improvement in walking speed in 4 weeks to improve community ambulation 0.562m with st. CaKasandra Knudsen/7/22; 0.625 m/s without AD (05/01/21); 0.71 m/s with st. cane 05/02/21 Revised 04/04/21  Goal met    LONG TERM GOALS:    LTG Name Target Date Goal status  1 Pt will be able to stand up from chair without HHA and without bracing knees against chair/bed to improve strength in bil LE Comments: Eval = needs to brace knees against chair to stand up; able to stand up but rquires CGA; able to stand without bracing knees (12/15/20) 12/01/20 Goal met 12/15/20  2 Pt will be able to ambulate >2000' with RW and without rest break to improve walking endurance Comments: Eval = 693' with RW;11/22/20 1527; 12/01/20 Goal met 12/15/20  3 Pt will be able to ambulate at least 800' with LRAD and without rest break to improve functional ambulation Comments: Eval = 693' with RW without rest break, 874' with st. Cane with CGA, 1175' with st. Cane with CGA (12/13/20); 1050' with st cane and CGA (12/15/20); 1050' with st. Cane and SBA/CGA; 1050' with st. Cane SBA 02/09/21   Goal met  02/09/21  4 Pt will be able to don/doff her shoes/braces off at EOB to improve indepence. Comments: Eval =  Husband helps her with getting her shoes and braces and puts them on for her. Pt able to don/doff shoes but due to short legs her legs dangle and pt doesn't feel comfortable. 02/09/21 Not met  5.  Pt will demo 120/120 sec on Modified CTSIB to improve standing balance on uneven surfaces 100/120 sec (02/09/21); 120/120 sec (04/04/21) 04/06/21 (revised 02/09/21) Goal met 04/04/21  6.  Pt will be able to stand on SLS for at  least 8 seconds to improve curb negotiations  3 sec bil (02/09/21); 4 sec on L and 5 sec on R (04/04/21) 06/13/21  (updated 04/06/21) Progressing continue  7. Pt will demo 0.43ms gait speed with st. Cane to confidentally access community negotiations 0.554m with st. CaKasandra Knudsen/7/22 06/13/21 Revised 04/04/21 Progressing continue    PLAN:   PT frequency: 1x/week   PT duration: 9 sessions in 10 weeks   Planned Interventions: Therapeutic exercises, Therapeutic activity, Neuro Muscular re-education, Balance training, Gait training, Patient/Family education, Joint mobilization, Stair training, Orthotic/Fit training, Cryotherapy and Moist heat   Plan for next session: Treadmill for gait training, continue to progress to 1 HHA if comfortable, increase speed and incline as tolerated   KaKerrie PleasurePT 05/16/2021, [address]hone: [phone] Fax:  [phone] name: ClZELA SOBIESKIRN: [id number]: 11Sep 26, 1934

## 2021-05-21 ENCOUNTER — Ambulatory Visit
Admission: RE | Admit: 2021-05-21 | Discharge: 2021-05-21 | Disposition: A | Discharge status: Home / Self Care | Payer: Medicare Other | Source: Ambulatory Visit | Attending: Gastroenterology | Admitting: Gastroenterology

## 2021-05-21 ENCOUNTER — Other Ambulatory Visit: Payer: Self-pay

## 2021-05-21 DIAGNOSIS — K862 Cyst of pancreas: Secondary | ICD-10-CM

## 2021-05-21 MED ORDER — GADOBENATE DIMEGLUMINE 529 MG/ML IV SOLN
11.0000 mL | Freq: Once | INTRAVENOUS | Status: AC | PRN
  Administered 2021-05-21: 11 mL via INTRAVENOUS

## 2021-05-23 ENCOUNTER — Ambulatory Visit: Payer: Medicare Other

## 2021-05-23 ENCOUNTER — Other Ambulatory Visit: Payer: Self-pay

## 2021-05-23 DIAGNOSIS — M6281 Muscle weakness (generalized): Secondary | ICD-10-CM | POA: Diagnosis not present

## 2021-05-23 DIAGNOSIS — R2689 Other abnormalities of gait and mobility: Secondary | ICD-10-CM

## 2021-05-23 NOTE — Therapy (Signed)
Outpatient Physical Therapy  Note Patient Name: Emily Cannon MRN: [id number] DOB:02-17-1933, 87 y.o., female Today's Date: 05/23/2021   PT End of Session - 05/23/21 1450     Visit Number 38    Number of Visits 40    Date for PT Re-Evaluation 06/13/21    Authorization Type 10 v PN on 11/22/20; Recert on 3/00/92-10/25/05 (2x/week for 12 sessions), 10 v PN on 01/17/62; recert 3/35/45 to 12/29/54 (1x/week for 6 sessions); re cert 10/04/91 to 73/42/87 for 9 sessions in 10 weeks    Progress Note Due on Visit 40    PT Start Time 1445    PT Stop Time 1530    PT Time Calculation (min) 45 min    Equipment Utilized During Treatment Gait belt    Activity Tolerance Patient tolerated treatment well    Behavior During Therapy WFL for tasks assessed/performed              PCP: Eulas Post, MD Referring Provier: Marcial Pacas, MD  Referring Diagnosis: R26.9 (ICD-10-CM) - Gait abnormality M35.9,G63 (ICD-10-CM) - 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 ([place] Visit Diagnosis: Muscle weakness (generalized)  Other abnormalities of gait and mobility  Past Medical History:  Diagnosis Date   Allergic rhinitis, cause unspecified    Allergy    Anxiety state, unspecified    Blood transfusion without reported diagnosis    2020   Cataract    removed both eyes    Chronic kidney disease    Disorder of bone and cartilage, unspecified    Esophageal candidiasis (HCC)    Foot drop    Gastric ulcer    History of CMV    History of Helicobacter pylori infection    Hypothyroid    Irritable bowel syndrome    Lumbago    Microscopic polyangiitis (HCC)    Other and unspecified hyperlipidemia    Perforation of tympanic membrane, unspecified    Unspecified essential hypertension    Past Surgical History:  Procedure Laterality Date   ABDOMINAL HYSTERECTOMY     BIOPSY  01/02/2019   Procedure: BIOPSY;  Surgeon: Yetta Flock, MD;  Location: [facility] ENDOSCOPY;  Service:  Gastroenterology;;   cataract surgery  5/12 and 12/24/2012   both eyes   COLONOSCOPY     COLONOSCOPY WITH PROPOFOL N/A 01/02/2019   Procedure: COLONOSCOPY WITH PROPOFOL;  Surgeon: Yetta Flock, MD;  Location: [place];  Service: Gastroenterology;  Laterality: N/A;   ESOPHAGOGASTRODUODENOSCOPY (EGD) WITH PROPOFOL N/A 01/02/2019   Procedure: ESOPHAGOGASTRODUODENOSCOPY (EGD) WITH PROPOFOL;  Surgeon: Yetta Flock, MD;  Location: [place];  Service: Gastroenterology;  Laterality: N/A;   NSVD     x1   POLYPECTOMY  01/02/2019   Procedure: POLYPECTOMY;  Surgeon: Yetta Flock, MD;  Location: [facility] ENDOSCOPY;  Service: Gastroenterology;;   POLYPECTOMY     TONSILLECTOMY     UPPER GASTROINTESTINAL ENDOSCOPY     Patient Active Problem List   Diagnosis Date Noted   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 ([place]) 09/25/2020   Peripheral neuropathy 05/08/2020   Weakness 03/28/2020   Gait abnormality 03/28/2020   Paresthesia 03/28/2020   Left foot drop 03/23/2020   Heme positive stool    Benign neoplasm of colon    Acute blood loss anemia 12/30/2018   Symptomatic anemia 12/15/2018   AKI (acute kidney injury) ([place]) 12/15/2018   Hypomagnesemia 12/15/2018   Steroid-induced hyperglycemia 12/15/2018   Microscopic polyangiitis ([facility]) 11/13/2018   Positive P-ANCA titer 10/14/2018  Viral URI 05/12/2018   Syncope 06/05/2016   Unspecified venous (peripheral) insufficiency 11/23/2012   Dermatitis 11/10/2012   Hypothyroidism 05/22/2011   TYMPANIC MEMBRANE PERFORATION, LEFT EAR 08/08/2007   Osteopenia 07/27/2007   Hyperlipidemia 05/11/2007   Anxiety state 05/11/2007   Essential hypertension 05/11/2007   ALLERGIC RHINITIS 05/11/2007   Irritable bowel syndrome 05/11/2007   LOW BACK PAIN 05/11/2007   HYSTERECTOMY, HX OF 05/11/2007    SUBJECTIVE: I hurt my R shoulder pulling on something yesterday so it is sore today  Pain: is patient experiencing pain? No.     OBJECTIVE:  04/04/21 Objective tests: Modified CTSIB: Test 1: 30 sec Test 2: 30 sec Test 3: 30 sec Test 4: 30 sec Total: 120 sec/120 sec  6 MWT (01/05/21): 510 feet with st. Cane; 738 feet with st. cane Gait speed: 10 meter walk test: 0.39m/s with st. Cane (01/05/21), 0.55 m/s with st. Cane (04/04/21) Modified CTSIB (02/09/21) 100 sec ; 120 sec 04/04/21  Gait speed: 0.30m/s without AD; 0.71 m/s with st. Cane (05/01/21)   Today's Treatment:  05/23/21 Gait training: 1 x 230' with SBA and st. Cane, smaller step length On R noted, decreased heel strike bil Soft tissue mobilization performed to L hip flexors followed by pssive hip flexor stretching off EOB Ambulated 1 x 230' no significant difference in her gait noted but patient reported improved ability to move L LE Soft tissue mobilization performed to R hip flexors followed by passive hip flexor stretching off EOB Ambulated 1 x 1150' without AD and SBACGA, cues to work on arm swings.  05/16/21: Gait training: Biodex harness for safety, 100% weight bearing, bil HHA 0.9 to 1.0 mph for 5' (0% incline) Rest 1.2 mph for 3' and then 1.13mph for another 3' (0% incline) Rest BP 140/52 and 131/51 with subsequent trials 1.90mph for 3' and then 1.8 mph for another 3' (0% incline) Rest 1.97mph for 2' and 2.68mph for 2' Rest  05/09/21: Gait training: Biodex harness for safety, 100% weight bearing,  1.0 mph for 2', tried no HHA to 1 HHA, but patient is guarded with walking due to inreased fear 1.72mph for 4' with bil HHA, cues for longer step length on R and to improve heel strike on L (pt has forefoot contact on L during heel strike) Rest 1.72mph for 4' with bil HHA and 5% incline, same cues as above. 1.97mph for 4' with bil HHA and 0% incline, same cues as above  Rest 1.8mph for 4' with bil HHA and 5% incline, same cues as above 2.72mph for 2' with bil HHA and 0% incline, same cues as above. HR at end of the walk: 67bpm  05/01/21 Gait  training:  x 230 feet without AD cues for longer step length and arm sings Resisted gait training: yellow band around ankles with posterior pull: 1 x 345' without AD Box tap: 2nd step on stairs bil HHA with yellow band around ankles with posterior pull: 2 x 10 R and L Box tap: 1st step on stairs: no HHA: 2 x 10 R and L Side steps: yellow band around ankles: 6 x 10 R and L rest after 3 sets  04/24/21: Resisted gait training:  - Ball and Chain: with 10lb ankle weight strapped at unilateral ankle with theraband on floor and pt drags it behind her as she walks. Cues for longer step length to advance limb in front of other: 2 x 230' no AD, min a required at times for LOB and stability  -  with black sport cord around waist and rehab aide provides resistance with posterior pull: 2 x 230' with same cues as above Pt education: pt educated to use st. Cane primarily in the house, use rollator at night and for community ambulation 04/18/21: Standing on foam with flat top: functional reaching  Bil OH reach: 10x  Toe touches: 10x Kicking 4 kg ball around cones placed in zigzag pattern 5 cones, 2x no AD Marching on trampoline: 20x no HHA Marching with one HHA: 10x R and L Practiced cooking scrambled eggs. Pt able to get things out of refrigerator, place it on countertop, bend to get pots and pans out, cook for 15'. Pt required SBA. Needed cues to not reach too far but step closer to minimize reaching.    Home Exercise Program: Access Code: [email or web address] URL: [email or web address] Date: 10/13/2020 Prepared by: Markus Jarvis  Exercises Straight Leg Raise - 1 x daily - 7 x weekly - 2 sets - 10 reps Supine Bridge - 1 x daily - 7 x weekly - 2 sets - 10 reps Sidelying Hip Abduction - 1 x daily - 7 x weekly - 2 sets - 10 reps Seated Knee Extension with Resistance - 1 x daily - 7 x weekly - 2 sets - 10 reps Seated Hamstring Curl with Anchored Resistance - 1 x daily - 7 x weekly - 2 sets - 10 reps     Assessment: Clinical impression:  Pt had signficaint tightness in bil hip flexors. Noted minor improvement in step length after stretching with L but not with R LE. Pt was given at home stretching for L hip flexors off EOB. Pt ambulated longest distance without AD today.  Rehab potential: Good   Clinical decision making: Stable/uncomplicated   Evaluation complexity: Moderate     Goals: Goals reviewed with patient? Yes   SHORT TERM GOALS:   STG Name Target Date Goal status  1 Pt will be be able to ambulate 1200' with RW to improve walking endurance Comments: Eval = 693' with RW; 1527' with RW (11/22/20) 11/03/20 Goal met   2 Pt will demo compliance with HEP to improve self management of symptoms. Comments: Eval = not issued; compliant 11/03/20 Goal met  3 Pt will get a shower chair and be able to sit and pivot into tub with min A from her husband. Got shower chair on 11/21/20 11/03/20 Goal met  4 Pt will demo 0.69ms improvement in walking speed in 4 weeks to improve community ambulation 0.532m with st. CaKasandra Knudsen/7/22; 0.625 m/s without AD (05/01/21); 0.71 m/s with st. cane 05/02/21 Revised 04/04/21  Goal met    LONG TERM GOALS:    LTG Name Target Date Goal status  1 Pt will be able to stand up from chair without HHA and without bracing knees against chair/bed to improve strength in bil LE Comments: Eval = needs to brace knees against chair to stand up; able to stand up but rquires CGA; able to stand without bracing knees (12/15/20) 12/01/20 Goal met 12/15/20  2 Pt will be able to ambulate >2000' with RW and without rest break to improve walking endurance Comments: Eval = 693' with RW;11/22/20 1527; 12/01/20 Goal met 12/15/20  3 Pt will be able to ambulate at least 800' with LRAD and without rest break to improve functional ambulation Comments: Eval = 693' with RW without rest break, 874' with st. Cane with CGA, 1175' with st. Cane with CGA (12/13/20); 1050' with st cane and CGA (12/15/20); 1050'  with  st. Cane and SBA/CGA; 1050' with st. Cane SBA 02/09/21   Goal met  02/09/21  4 Pt will be able to don/doff her shoes/braces off at EOB to improve indepence. Comments: Eval =  Husband helps her with getting her shoes and braces and puts them on for her. Pt able to don/doff shoes but due to short legs her legs dangle and pt doesn't feel comfortable. 02/09/21 Not met  5.  Pt will demo 120/120 sec on Modified CTSIB to improve standing balance on uneven surfaces 100/120 sec (02/09/21); 120/120 sec (04/04/21) 04/06/21 (revised 02/09/21) Goal met 04/04/21  6.  Pt will be able to stand on SLS for at least 8 seconds to improve curb negotiations  3 sec bil (02/09/21); 4 sec on L and 5 sec on R (04/04/21) 06/13/21  (updated 04/06/21) Progressing continue  7. Pt will demo 0.78ms gait speed with st. Cane to confidentally access community negotiations 0.567m with st. CaKasandra Knudsen/7/22 06/13/21 Revised 04/04/21 Progressing continue    PLAN:   PT frequency: 1x/week   PT duration: 9 sessions in 10 weeks   Planned Interventions: Therapeutic exercises, Therapeutic activity, Neuro Muscular re-education, Balance training, Gait training, Patient/Family education, Joint mobilization, Stair training, Orthotic/Fit training, Cryotherapy and Moist heat   Plan for next session: Treadmill for gait training, continue to progress to 1 HHA if comfortable, increase speed and incline as tolerated   KaKerrie PleasurePT 05/23/2021, [address] [place] Resort[place]hone: [phone] Fax:  [phone] name: ClRYANN PAULIRN: [id number]: [birth year]

## 2021-05-30 ENCOUNTER — Other Ambulatory Visit: Payer: Self-pay

## 2021-05-30 ENCOUNTER — Ambulatory Visit: Payer: Medicare Other | Attending: Neurology

## 2021-05-30 DIAGNOSIS — M6281 Muscle weakness (generalized): Secondary | ICD-10-CM | POA: Insufficient documentation

## 2021-05-30 DIAGNOSIS — R2689 Other abnormalities of gait and mobility: Secondary | ICD-10-CM | POA: Insufficient documentation

## 2021-05-30 NOTE — Therapy (Signed)
Outpatient Physical Therapy  Note Patient Name: Emily Cannon MRN: [id number] DOB:08/18/1932, 87 y.o., female Today's Date: 05/31/2021   PT End of Session - 05/30/21 1508     Visit Number 39    Number of Visits 40    Date for PT Re-Evaluation 06/13/21    Authorization Type 10 v PN on 11/22/20; Recert on 9/73/53-2/99/24 (2x/week for 12 sessions), 10 v PN on 2/68/34; recert 1/96/22 to 09/06/77 (1x/week for 6 sessions); re cert 03/06/20 to 19/41/74 for 9 sessions in 10 weeks    Progress Note Due on Visit 40    PT Start Time 1445    PT Stop Time 1530    PT Time Calculation (min) 45 min    Equipment Utilized During Treatment Gait belt    Activity Tolerance Patient tolerated treatment well    Behavior During Therapy WFL for tasks assessed/performed              PCP: Eulas Post, MD Referring Provier: Marcial Pacas, MD  Referring Diagnosis: R26.9 (ICD-10-CM) - Gait abnormality M35.9,G63 (ICD-10-CM) - 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 ([place] Visit Diagnosis: Muscle weakness (generalized)  Other abnormalities of gait and mobility  Past Medical History:  Diagnosis Date   Allergic rhinitis, cause unspecified    Allergy    Anxiety state, unspecified    Blood transfusion without reported diagnosis    2020   Cataract    removed both eyes    Chronic kidney disease    Disorder of bone and cartilage, unspecified    Esophageal candidiasis (HCC)    Foot drop    Gastric ulcer    History of CMV    History of Helicobacter pylori infection    Hypothyroid    Irritable bowel syndrome    Lumbago    Microscopic polyangiitis (HCC)    Other and unspecified hyperlipidemia    Perforation of tympanic membrane, unspecified    Unspecified essential hypertension    Past Surgical History:  Procedure Laterality Date   ABDOMINAL HYSTERECTOMY     BIOPSY  01/02/2019   Procedure: BIOPSY;  Surgeon: Yetta Flock, MD;  Location: [facility] ENDOSCOPY;  Service:  Gastroenterology;;   cataract surgery  5/12 and 12/24/2012   both eyes   COLONOSCOPY     COLONOSCOPY WITH PROPOFOL N/A 01/02/2019   Procedure: COLONOSCOPY WITH PROPOFOL;  Surgeon: Yetta Flock, MD;  Location: [place];  Service: Gastroenterology;  Laterality: N/A;   ESOPHAGOGASTRODUODENOSCOPY (EGD) WITH PROPOFOL N/A 01/02/2019   Procedure: ESOPHAGOGASTRODUODENOSCOPY (EGD) WITH PROPOFOL;  Surgeon: Yetta Flock, MD;  Location: [place];  Service: Gastroenterology;  Laterality: N/A;   NSVD     x1   POLYPECTOMY  01/02/2019   Procedure: POLYPECTOMY;  Surgeon: Yetta Flock, MD;  Location: [facility] ENDOSCOPY;  Service: Gastroenterology;;   POLYPECTOMY     TONSILLECTOMY     UPPER GASTROINTESTINAL ENDOSCOPY     Patient Active Problem List   Diagnosis Date Noted   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 ([place]) 09/25/2020   Peripheral neuropathy 05/08/2020   Weakness 03/28/2020   Gait abnormality 03/28/2020   Paresthesia 03/28/2020   Left foot drop 03/23/2020   Heme positive stool    Benign neoplasm of colon    Acute blood loss anemia 12/30/2018   Symptomatic anemia 12/15/2018   AKI (acute kidney injury) ([place]) 12/15/2018   Hypomagnesemia 12/15/2018   Steroid-induced hyperglycemia 12/15/2018   Microscopic polyangiitis ([place]) 11/13/2018   Positive P-ANCA titer 10/14/2018  Viral URI 05/12/2018   Syncope 06/05/2016   Unspecified venous (peripheral) insufficiency 11/23/2012   Dermatitis 11/10/2012   Hypothyroidism 05/22/2011   TYMPANIC MEMBRANE PERFORATION, LEFT EAR 08/08/2007   Osteopenia 07/27/2007   Hyperlipidemia 05/11/2007   Anxiety state 05/11/2007   Essential hypertension 05/11/2007   ALLERGIC RHINITIS 05/11/2007   Irritable bowel syndrome 05/11/2007   LOW BACK PAIN 05/11/2007   HYSTERECTOMY, HX OF 05/11/2007    SUBJECTIVE: I hurt my R shoulder pulling on something yesterday so it is sore today  Pain: is patient experiencing pain? No.     OBJECTIVE:  04/04/21 Objective tests: Modified CTSIB: Test 1: 30 sec Test 2: 30 sec Test 3: 30 sec Test 4: 30 sec Total: 120 sec/120 sec  6 MWT (01/05/21): 510 feet with st. Cane; 738 feet with st. cane Gait speed: 10 meter walk test: 0.60ms with st. Cane (01/05/21), 0.55 m/s with st. Cane (04/04/21) Modified CTSIB (02/09/21) 100 sec ; 120 sec 04/04/21  Gait speed: 0.660m without AD; 0.71 m/s with st. Cane (05/01/21)   Today's Treatment:  05/30/21:  Gait training: 1 x 115' with 10lb KB in R hand; 1 x 115' with 10lb in L hand  3  x 20 feet walking fwd and bwd  4 x 20 feet, with frequent changing of directions with fwd and bwd  Turning 180 deg with frequent walking fwd and bwd.  Going up and down ramp and curb with st. Cane: 4x total with CGA  05/23/21 Gait training: 1 x 230' with SBA and st. Cane, smaller step length On R noted, decreased heel strike bil Soft tissue mobilization performed to L hip flexors followed by pssive hip flexor stretching off EOB Ambulated 1 x 230' no significant difference in her gait noted but patient reported improved ability to move L LE Soft tissue mobilization performed to R hip flexors followed by passive hip flexor stretching off EOB Ambulated 1 x 1150' without AD and SBACGA, cues to work on arm swings.  05/16/21: Gait training: Biodex harness for safety, 100% weight bearing, bil HHA 0.9 to 1.0 mph for 5' (0% incline) Rest 1.2 mph for 3' and then 1.50m1mfor another 3' (0% incline) Rest BP 140/52 and 131/51 with subsequent trials 1.50mp62mor 3' and then 1.8 mph for another 3' (0% incline) Rest 1.8mph62mr 2' and 2.0mph 69m 2' Rest  05/09/21: Gait training: Biodex harness for safety, 100% weight bearing,  1.0 mph for 2', tried no HHA to 1 HHA, but patient is guarded with walking due to inreased fear 1.50mph f107m4' with bil HHA, cues for longer step length on R and to improve heel strike on L (pt has forefoot contact on L during heel  strike) Rest 1.50mph fo29m' with bil HHA and 5% incline, same cues as above. 1.6mph for21m with bil HHA and 0% incline, same cues as above  Rest 1.6mph for 650mwith bil HHA and 5% incline, same cues as above 2.0mph for 250mith bil HHA and 0% incline, same cues as above. HR at end of the walk: 67bpm  05/01/21 Gait training:  x 230 feet without AD cues for longer step length and arm sings Resisted gait training: yellow band around ankles with posterior pull: 1 x 345' without AD Box tap: 2nd step on stairs bil HHA with yellow band around ankles with posterior pull: 2 x 10 R and L Box tap: 1st step on stairs: no HHA: 2 x 10 R and L Side steps:  yellow band around ankles: 6 x 10 R and L rest after 3 sets  04/24/21: Resisted gait training:  - Ball and Chain: with 10lb ankle weight strapped at unilateral ankle with theraband on floor and pt drags it behind her as she walks. Cues for longer step length to advance limb in front of other: 2 x 230' no AD, min a required at times for LOB and stability  - with black sport cord around waist and rehab aide provides resistance with posterior pull: 2 x 230' with same cues as above Pt education: pt educated to use st. Cane primarily in the house, use rollator at night and for community ambulation 04/18/21: Standing on foam with flat top: functional reaching  Bil OH reach: 10x  Toe touches: 10x Kicking 4 kg ball around cones placed in zigzag pattern 5 cones, 2x no AD Marching on trampoline: 20x no HHA Marching with one HHA: 10x R and L Practiced cooking scrambled eggs. Pt able to get things out of refrigerator, place it on countertop, bend to get pots and pans out, cook for 15'. Pt required SBA. Needed cues to not reach too far but step closer to minimize reaching.    Home Exercise Program: Access Code: [email or web address] URL: [email or web address] Date: 10/13/2020 Prepared by: Markus Jarvis  Exercises Straight Leg Raise - 1 x daily - 7 x weekly - 2  sets - 10 reps Supine Bridge - 1 x daily - 7 x weekly - 2 sets - 10 reps Sidelying Hip Abduction - 1 x daily - 7 x weekly - 2 sets - 10 reps Seated Knee Extension with Resistance - 1 x daily - 7 x weekly - 2 sets - 10 reps Seated Hamstring Curl with Anchored Resistance - 1 x daily - 7 x weekly - 2 sets - 10 reps    Assessment: Clinical impression:  Pt had signficaint tightness in bil hip flexors. Noted minor improvement in step length after stretching with L but not with R LE. Pt was given at home stretching for L hip flexors off EOB. Pt ambulated longest distance without AD today.  Rehab potential: Good   Clinical decision making: Stable/uncomplicated   Evaluation complexity: Moderate     Goals: Goals reviewed with patient? Yes   SHORT TERM GOALS:   STG Name Target Date Goal status  1 Pt will be be able to ambulate 1200' with RW to improve walking endurance Comments: Eval = 693' with RW; 1527' with RW (11/22/20) 11/03/20 Goal met   2 Pt will demo compliance with HEP to improve self management of symptoms. Comments: Eval = not issued; compliant 11/03/20 Goal met  3 Pt will get a shower chair and be able to sit and pivot into tub with min A from her husband. Got shower chair on 11/21/20 11/03/20 Goal met  4 Pt will demo 0.47m/s improvement in walking speed in 4 weeks to improve community ambulation 0.44m/s with st. Kasandra Knudsen 04/04/21; 0.625 m/s without AD (05/01/21); 0.71 m/s with st. cane 05/02/21 Revised 04/04/21  Goal met    LONG TERM GOALS:    LTG Name Target Date Goal status  1 Pt will be able to stand up from chair without HHA and without bracing knees against chair/bed to improve strength in bil LE Comments: Eval = needs to brace knees against chair to stand up; able to stand up but rquires CGA; able to stand without bracing knees (12/15/20) 12/01/20 Goal met 12/15/20  2 Pt will be able to ambulate >  2000' with RW and without rest break to improve walking endurance Comments: Eval = 693' with  RW;11/22/20 1527; 12/01/20 Goal met 12/15/20  3 Pt will be able to ambulate at least 800' with LRAD and without rest break to improve functional ambulation Comments: Eval = 693' with RW without rest break, 874' with st. Cane with CGA, 1175' with st. Cane with CGA (12/13/20); 1050' with st cane and CGA (12/15/20); 1050' with st. Cane and SBA/CGA; 1050' with st. Cane SBA 02/09/21   Goal met  02/09/21  4 Pt will be able to don/doff her shoes/braces off at EOB to improve indepence. Comments: Eval =  Husband helps her with getting her shoes and braces and puts them on for her. Pt able to don/doff shoes but due to short legs her legs dangle and pt doesn't feel comfortable. 02/09/21 Not met  5.  Pt will demo 120/120 sec on Modified CTSIB to improve standing balance on uneven surfaces 100/120 sec (02/09/21); 120/120 sec (04/04/21) 04/06/21 (revised 02/09/21) Goal met 04/04/21  6.  Pt will be able to stand on SLS for at least 8 seconds to improve curb negotiations  3 sec bil (02/09/21); 4 sec on L and 5 sec on R (04/04/21) 06/13/21  (updated 04/06/21) Progressing continue  7. Pt will demo 0.10ms gait speed with st. Cane to confidentally access community negotiations 0.564m with st. CaKasandra Knudsen/7/22 06/13/21 Revised 04/04/21 Progressing continue    PLAN:   PT frequency: 1x/week   PT duration: 9 sessions in 10 weeks   Planned Interventions: Therapeutic exercises, Therapeutic activity, Neuro Muscular re-education, Balance training, Gait training, Patient/Family education, Joint mobilization, Stair training, Orthotic/Fit training, Cryotherapy and Moist heat   Plan for next session: Treadmill for gait training, continue to progress to 1 HHA if comfortable, increase speed and incline as tolerated   KaKerrie PleasurePT 05/31/2021, [address]hone: [phone] Fax:  [phone] name: ClCYANI KALLSTROMRN: [id number]: [birth year]

## 2021-06-02 ENCOUNTER — Other Ambulatory Visit: Payer: Self-pay | Admitting: Gastroenterology

## 2021-06-05 ENCOUNTER — Ambulatory Visit: Payer: Medicare Other

## 2021-06-05 ENCOUNTER — Other Ambulatory Visit: Payer: Self-pay

## 2021-06-05 DIAGNOSIS — M6281 Muscle weakness (generalized): Secondary | ICD-10-CM | POA: Diagnosis not present

## 2021-06-05 DIAGNOSIS — R2689 Other abnormalities of gait and mobility: Secondary | ICD-10-CM

## 2021-06-05 NOTE — Therapy (Signed)
Outpatient Physical Therapy  Discharge Note Patient Name: Emily Cannon MRN: [id number] DOB:03-Jan-1933, 87 y.o., female Today's Date: 06/05/2021   PT End of Session - 06/05/21 1447     Visit Number 40    Number of Visits 40    Date for PT Re-Evaluation 06/13/21    Authorization Type 10 v PN on 11/22/20; Recert on 10/21/47-03/23/40 (2x/week for 12 sessions), 10 v PN on 5/83/09; recert 11/03/66 to 0/8/81 (1x/week for 6 sessions); re cert 1/0/31 to 59/45/85 for 9 sessions in 10 weeks    Progress Note Due on Visit 40    PT Start Time 1445    PT Stop Time 1530    PT Time Calculation (min) 45 min    Equipment Utilized During Treatment Gait belt    Activity Tolerance Patient tolerated treatment well    Behavior During Therapy WFL for tasks assessed/performed              PCP: Eulas Post, MD Referring Provier: Marcial Pacas, MD  Referring Diagnosis: R26.9 (ICD-10-CM) - Gait abnormality M35.9,G63 (ICD-10-CM) - 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 ([place] Visit Diagnosis: Muscle weakness (generalized)  Other abnormalities of gait and mobility  Past Medical History:  Diagnosis Date   Allergic rhinitis, cause unspecified    Allergy    Anxiety state, unspecified    Blood transfusion without reported diagnosis    2020   Cataract    removed both eyes    Chronic kidney disease    Disorder of bone and cartilage, unspecified    Esophageal candidiasis (HCC)    Foot drop    Gastric ulcer    History of CMV    History of Helicobacter pylori infection    Hypothyroid    Irritable bowel syndrome    Lumbago    Microscopic polyangiitis (HCC)    Other and unspecified hyperlipidemia    Perforation of tympanic membrane, unspecified    Unspecified essential hypertension    Past Surgical History:  Procedure Laterality Date   ABDOMINAL HYSTERECTOMY     BIOPSY  01/02/2019   Procedure: BIOPSY;  Surgeon: Yetta Flock, MD;  Location: [facility] ENDOSCOPY;  Service:  Gastroenterology;;   cataract surgery  5/12 and 12/24/2012   both eyes   COLONOSCOPY     COLONOSCOPY WITH PROPOFOL N/A 01/02/2019   Procedure: COLONOSCOPY WITH PROPOFOL;  Surgeon: Yetta Flock, MD;  Location: [place];  Service: Gastroenterology;  Laterality: N/A;   ESOPHAGOGASTRODUODENOSCOPY (EGD) WITH PROPOFOL N/A 01/02/2019   Procedure: ESOPHAGOGASTRODUODENOSCOPY (EGD) WITH PROPOFOL;  Surgeon: Yetta Flock, MD;  Location: [place];  Service: Gastroenterology;  Laterality: N/A;   NSVD     x1   POLYPECTOMY  01/02/2019   Procedure: POLYPECTOMY;  Surgeon: Yetta Flock, MD;  Location: [facility] ENDOSCOPY;  Service: Gastroenterology;;   POLYPECTOMY     TONSILLECTOMY     UPPER GASTROINTESTINAL ENDOSCOPY     Patient Active Problem List   Diagnosis Date Noted   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 ([place]) 09/25/2020   Peripheral neuropathy 05/08/2020   Weakness 03/28/2020   Gait abnormality 03/28/2020   Paresthesia 03/28/2020   Left foot drop 03/23/2020   Heme positive stool    Benign neoplasm of colon    Acute blood loss anemia 12/30/2018   Symptomatic anemia 12/15/2018   AKI (acute kidney injury) ([place]) 12/15/2018   Hypomagnesemia 12/15/2018   Steroid-induced hyperglycemia 12/15/2018   Microscopic polyangiitis ([place]) 11/13/2018   Positive P-ANCA titer 10/14/2018  Viral URI 05/12/2018   Syncope 06/05/2016   Unspecified venous (peripheral) insufficiency 11/23/2012   Dermatitis 11/10/2012   Hypothyroidism 05/22/2011   TYMPANIC MEMBRANE PERFORATION, LEFT EAR 08/08/2007   Osteopenia 07/27/2007   Hyperlipidemia 05/11/2007   Anxiety state 05/11/2007   Essential hypertension 05/11/2007   ALLERGIC RHINITIS 05/11/2007   Irritable bowel syndrome 05/11/2007   LOW BACK PAIN 05/11/2007   HYSTERECTOMY, HX OF 05/11/2007    SUBJECTIVE: I am doing okay. I am trying to use cane mostly.  Pain: is patient experiencing pain? No.    OBJECTIVE:  04/04/21  Objective tests: Modified CTSIB: Test 1: 30 sec Test 2: 30 sec Test 3: 30 sec Test 4: 30 sec Total: 120 sec/120 sec  6 MWT (01/05/21): 510 feet with st. Cane; 738 feet with st. cane Gait speed: 10 meter walk test: 0.22ms with st. Cane (01/05/21), 0.55 m/s with st. Cane (04/04/21) Modified CTSIB (02/09/21) 100 sec ; 120 sec 04/04/21  Gait speed: 0.650m without AD; 0.71 m/s with st. Cane (05/01/21)   Today's Treatment:  06/05/21  Patient education: educated pt on moving commounly used kitchen items at waist height or within reach so she doesn't have to bend down or squat down.  Pt educated on using cane mostly and using RW at night time or when she is not feeling as strong. Using RW for community ambulation and using cane for in home mobility  Reassessment performed, goals assessed  Ramp and curb: 3x with st. Cane SBA  Stairs: 4 steps with cane and rail on R; 4 steps with cane and rail on L SBA 05/30/21:  Gait training: 1 x 115' with 10lb KB in R hand; 1 x 115' with 10lb in L hand  3  x 20 feet walking fwd and bwd  4 x 20 feet, with frequent changing of directions with fwd and bwd  Turning 180 deg with frequent walking fwd and bwd.  Going up and down ramp and curb with st. Cane: 4x total with CGA  05/23/21 Gait training: 1 x 230' with SBA and st. Cane, smaller step length On R noted, decreased heel strike bil Soft tissue mobilization performed to L hip flexors followed by pssive hip flexor stretching off EOB Ambulated 1 x 230' no significant difference in her gait noted but patient reported improved ability to move L LE Soft tissue mobilization performed to R hip flexors followed by passive hip flexor stretching off EOB Ambulated 1 x 1150' without AD and SBACGA, cues to work on arm swings.  05/16/21: Gait training: Biodex harness for safety, 100% weight bearing, bil HHA 0.9 to 1.0 mph for 5' (0% incline) Rest 1.2 mph for 3' and then 1.90m63mfor another 3' (0% incline) Rest BP 140/52  and 131/51 with subsequent trials 1.90mp39mor 3' and then 1.8 mph for another 3' (0% incline) Rest 1.8mph52mr 2' and 2.0mph 19m 2' Rest  05/09/21: Gait training: Biodex harness for safety, 100% weight bearing,  1.0 mph for 2', tried no HHA to 1 HHA, but patient is guarded with walking due to inreased fear 1.90mph f72m4' with bil HHA, cues for longer step length on R and to improve heel strike on L (pt has forefoot contact on L during heel strike) Rest 1.90mph fo103m' with bil HHA and 5% incline, same cues as above. 1.6mph for65m with bil HHA and 0% incline, same cues as above  Rest 1.6mph for 35mwith bil HHA and 5% incline, same cues as above  2.69mh for 2' with bil HHA and 0% incline, same cues as above. HR at end of the walk: 67bpm  05/01/21 Gait training:  x 230 feet without AD cues for longer step length and arm sings Resisted gait training: yellow band around ankles with posterior pull: 1 x 345' without AD Box tap: 2nd step on stairs bil HHA with yellow band around ankles with posterior pull: 2 x 10 R and L Box tap: 1st step on stairs: no HHA: 2 x 10 R and L Side steps: yellow band around ankles: 6 x 10 R and L rest after 3 sets  04/24/21: Resisted gait training:  - Ball and Chain: with 10lb ankle weight strapped at unilateral ankle with theraband on floor and pt drags it behind her as she walks. Cues for longer step length to advance limb in front of other: 2 x 230' no AD, min a required at times for LOB and stability  - with black sport cord around waist and rehab aide provides resistance with posterior pull: 2 x 230' with same cues as above Pt education: pt educated to use st. Cane primarily in the house, use rollator at night and for community ambulation 04/18/21: Standing on foam with flat top: functional reaching  Bil OH reach: 10x  Toe touches: 10x Kicking 4 kg ball around cones placed in zigzag pattern 5 cones, 2x no AD Marching on trampoline: 20x no HHA Marching with one  HHA: 10x R and L Practiced cooking scrambled eggs. Pt able to get things out of refrigerator, place it on countertop, bend to get pots and pans out, cook for 15'. Pt required SBA. Needed cues to not reach too far but step closer to minimize reaching.    Home Exercise Program: Access Code: [email or web address]: [email or web address] Date: 10/13/2020 Prepared by: KMarkus Jarvis Exercises Straight Leg Raise - 1 x daily - 7 x weekly - 2 sets - 10 reps Supine Bridge - 1 x daily - 7 x weekly - 2 sets - 10 reps Sidelying Hip Abduction - 1 x daily - 7 x weekly - 2 sets - 10 reps Seated Knee Extension with Resistance - 1 x daily - 7 x weekly - 2 sets - 10 reps Seated Hamstring Curl with Anchored Resistance - 1 x daily - 7 x weekly - 2 sets - 10 reps    Assessment: Clinical impression:  Patient has been seen for total of 40 sessions. Patient has progressed well and met all of her short term goals and 5/7 of long term goals. Patient has made significant progress in her gait speed from last assessment 2 months ago. Patient demo no significant improvement in her single leg balance score. Patient has reached maximum potential from skilled PT and will be discharged from skilled PT with independent home exercises to manage her symptoms and maintain her progress.  Rehab potential: Good   Clinical decision making: Stable/uncomplicated   Evaluation complexity: Moderate     Goals: Goals reviewed with patient? Yes   SHORT TERM GOALS:   STG Name Target Date Goal status  1 Pt will be be able to ambulate 1200' with RW to improve walking endurance Comments: Eval = 693' with RW; 1527' with RW (11/22/20) 11/03/20 Goal met   2 Pt will demo compliance with HEP to improve self management of symptoms. Comments: Eval = not issued; compliant 11/03/20 Goal met  3 Pt will get a shower chair and be able to sit and pivot into tub with  min A from her husband. Got shower chair on 11/21/20 11/03/20 Goal met  4 Pt will  demo 0.95ms improvement in walking speed in 4 weeks to improve community ambulation 0.535m with st. CaKasandra Knudsen/7/22; 0.625 m/s without AD (05/01/21); 0.71 m/s with st. cane 05/02/21 Revised 04/04/21  Goal met    LONG TERM GOALS:    LTG Name Target Date Goal status  1 Pt will be able to stand up from chair without HHA and without bracing knees against chair/bed to improve strength in bil LE Comments: Eval = needs to brace knees against chair to stand up; able to stand up but rquires CGA; able to stand without bracing knees (12/15/20) 12/01/20 Goal met 12/15/20  2 Pt will be able to ambulate >2000' with RW and without rest break to improve walking endurance Comments: Eval = 693' with RW;11/22/20 1527; 12/01/20 Goal met 12/15/20  3 Pt will be able to ambulate at least 800' with LRAD and without rest break to improve functional ambulation Comments: Eval = 693' with RW without rest break, 874' with st. Cane with CGA, 1175' with st. Cane with CGA (12/13/20); 1050' with st cane and CGA (12/15/20); 1050' with st. Cane and SBA/CGA; 1050' with st. Cane SBA 02/09/21   Goal met  02/09/21  4 Pt will be able to don/doff her shoes/braces off at EOB to improve indepence. Comments: Eval =  Husband helps her with getting her shoes and braces and puts them on for her. Pt able to don/doff shoes but due to short legs her legs dangle and pt doesn't feel comfortable. 02/09/21 Not met  5.  Pt will demo 120/120 sec on Modified CTSIB to improve standing balance on uneven surfaces 100/120 sec (02/09/21); 120/120 sec (04/04/21) 04/06/21 (revised 02/09/21) Goal met 04/04/21  6.  Pt will be able to stand on SLS for at least 8 seconds to improve curb negotiations  3 sec bil (02/09/21); 4 sec on L and 5 sec on R (04/04/21); 2 sec on L and 6 sec on R (06/05/21) 06/13/21  (updated 04/06/21) Not met  7. Pt will demo 0.35m9mgait speed with st. Cane to confidentally access community negotiations 0.66m63mith st. CaneKasandra Knudsen/22; 0.98m/59mth st. Cane Kasandra Knudsen/22  06/13/21 Revised 04/04/21 Partially met    PLAN:   Discahrge from skilled PT   KarmeKerrie Pleasure11/02/2021, [id number]: [phone]:  [phone] name: Emily Cannon [id number] 06/02/09/12/1932

## 2021-06-19 ENCOUNTER — Ambulatory Visit: Payer: Medicare Other | Admitting: Gastroenterology

## 2021-07-11 ENCOUNTER — Telehealth: Payer: Self-pay | Admitting: Family Medicine

## 2021-07-11 NOTE — Telephone Encounter (Signed)
Spoke with the patient she stated she has only been taking the Kaopectate for 4 days not 3 weeks. Appointment has been scheduled for next Tuesday. Will send to Dr. Elease Hashimoto as Juluis Rainier

## 2021-07-11 NOTE — Telephone Encounter (Signed)
Patient had antibiotic for kidney infection, but has gotten diarrhea. Patient began taking  Kaopectate and finished the antibiotic but she is still having diarrhea. Kaopectate also states that you shouldn't take it more than two days without contacting doctor, but she has been on it for three weeks. Husband wants to know if she should continue taking or stop.      Good callback number [phone]    Please advise

## 2021-07-14 ENCOUNTER — Other Ambulatory Visit: Payer: Self-pay | Admitting: Internal Medicine

## 2021-07-14 DIAGNOSIS — I10 Essential (primary) hypertension: Secondary | ICD-10-CM

## 2021-07-17 ENCOUNTER — Ambulatory Visit: Payer: Medicare Other | Admitting: Family Medicine

## 2021-07-17 VITALS — BP 138/60 | Temp 97.8°F

## 2021-07-17 DIAGNOSIS — E7800 Pure hypercholesterolemia, unspecified: Secondary | ICD-10-CM | POA: Diagnosis not present

## 2021-07-17 DIAGNOSIS — E039 Hypothyroidism, unspecified: Secondary | ICD-10-CM | POA: Diagnosis not present

## 2021-07-17 DIAGNOSIS — I10 Essential (primary) hypertension: Secondary | ICD-10-CM

## 2021-07-17 LAB — HEPATIC FUNCTION PANEL
ALT: 15 U/L (ref 0–35)
AST: 21 U/L (ref 0–37)
Albumin: 4.3 g/dL (ref 3.5–5.2)
Alkaline Phosphatase: 103 U/L (ref 39–117)
Bilirubin, Direct: 0 mg/dL (ref 0.0–0.3)
Total Bilirubin: 0.4 mg/dL (ref 0.2–1.2)
Total Protein: 6.4 g/dL (ref 6.0–8.3)

## 2021-07-17 LAB — LIPID PANEL
Cholesterol: 163 mg/dL (ref 0–200)
HDL: 36.3 mg/dL — ABNORMAL LOW (ref >39.00)
Total CHOL/HDL Ratio: 5
Triglycerides: 716 mg/dL — ABNORMAL HIGH (ref 0.0–149.0)

## 2021-07-17 LAB — LDL CHOLESTEROL, DIRECT: Direct LDL: 44 mg/dL

## 2021-07-17 LAB — TSH: TSH: 2.85 u[IU]/mL (ref 0.35–5.50)

## 2021-07-17 MED ORDER — LEVOTHYROXINE SODIUM 50 MCG PO TABS: 50.0000 ug | ORAL_TABLET | Freq: Every day | ORAL | 1 refills | Status: DC

## 2021-07-17 NOTE — Progress Notes (Signed)
Established Patient Office Visit  Subjective:  Patient ID: Emily Cannon, female    DOB: 1933/05/13  Age: 87 y.o. MRN: [id number]  CC: No chief complaint on file.   HPI  Emily Cannon presents for medical follow-up.  She has history of microscopic polyangiitis, hypertension, hypothyroidism, hyperlipidemia, peripheral neuropathy, chronic bilateral leg edema.  She is followed by rheumatology both locally and over at [facility] and apparently her microscopic polyangiitis has been very stable.  She is on experimental drug.  Overall feels fairly well.  She does continue to have some chronic lower extremity edema and uses Lasix 40 mg daily for that.  She has hypothyroidism and last TSH was about a year ago.  She is currently on amlodipine and metoprolol for hypertension.  We tapered her off hydralazine within the past year and her blood pressures have remained stable.  Recent renal function stable.  She had creatinine 1.53 and GFR around 33 by labs back in September.  She has bilateral foot drop and has braces for lower extremities.  Past Medical History:  Diagnosis Date   Allergic rhinitis, cause unspecified    Allergy    Anxiety state, unspecified    Blood transfusion without reported diagnosis    2020   Cataract    removed both eyes    Chronic kidney disease    Disorder of bone and cartilage, unspecified    Esophageal candidiasis (HCC)    Foot drop    Gastric ulcer    History of CMV    History of Helicobacter pylori infection    Hypothyroid    Irritable bowel syndrome    Lumbago    Microscopic polyangiitis (HCC)    Other and unspecified hyperlipidemia    Perforation of tympanic membrane, unspecified    Unspecified essential hypertension     Past Surgical History:  Procedure Laterality Date   ABDOMINAL HYSTERECTOMY     BIOPSY  01/02/2019   Procedure: BIOPSY;  Surgeon: Yetta Flock, MD;  Location: [place];  Service: Gastroenterology;;   cataract surgery   5/12 and 12/24/2012   both eyes   COLONOSCOPY     COLONOSCOPY WITH PROPOFOL N/A 01/02/2019   Procedure: COLONOSCOPY WITH PROPOFOL;  Surgeon: Yetta Flock, MD;  Location: [place];  Service: Gastroenterology;  Laterality: N/A;   ESOPHAGOGASTRODUODENOSCOPY (EGD) WITH PROPOFOL N/A 01/02/2019   Procedure: ESOPHAGOGASTRODUODENOSCOPY (EGD) WITH PROPOFOL;  Surgeon: Yetta Flock, MD;  Location: [place];  Service: Gastroenterology;  Laterality: N/A;   NSVD     x1   POLYPECTOMY  01/02/2019   Procedure: POLYPECTOMY;  Surgeon: Yetta Flock, MD;  Location: [facility] ENDOSCOPY;  Service: Gastroenterology;;   POLYPECTOMY     TONSILLECTOMY     UPPER GASTROINTESTINAL ENDOSCOPY      Family History  Problem Relation Age of Onset   Lung cancer Mother 108   Lung disease Father 21   COPD Other        sibling   Colon cancer Neg Hx    Colon polyps Neg Hx    Esophageal cancer Neg Hx    Rectal cancer Neg Hx    Stomach cancer Neg Hx     Social History   Socioeconomic History   Marital status: Married    Spouse name: Jeneen Rinks   Number of children: 2   Years of education: 12   Highest education level: High school graduate  Occupational History   Occupation: retired Research scientist (physical sciences)  Tobacco Use   Smoking status:  Never   Smokeless tobacco: Never  Vaping Use   Vaping Use: Never used  Substance and Sexual Activity   Alcohol use: No   Drug use: No   Sexual activity: Not Currently  Other Topics Concern   Not on file  Social History Narrative   Lives at home with her husband.   Right-handed.   1-2 cups coffee daily, occasional tea or soda.   Social Determinants of Health   Financial Resource Strain: Not on file  Food Insecurity: Not on file  Transportation Needs: Not on file  Physical Activity: Not on file  Stress: Not on file  Social Connections: Not on file  Intimate Partner Violence: Not on file    Outpatient Medications Prior to Visit  Medication Sig Dispense Refill    amLODipine (NORVASC) 10 MG tablet TAKE ONE TABLET BY MOUTH DAILY 90 tablet 3   atorvastatin (LIPITOR) 20 MG tablet TAKE ONE TABLET BY MOUTH DAILY 90 tablet 3   Avacopan (TAVNEOS) 10 MG CAPS Take 3 capsules by mouth twice daily with meals.     calcitRIOL (ROCALTROL) 0.25 MCG capsule Take 0.25 mcg by mouth every Monday, Wednesday, and Friday.      Cholecalciferol 25 MCG (1000 UT) tablet Take 1 capsule by mouth daily.     clotrimazole-betamethasone (LOTRISONE) cream Apply 1 application topically 2 (two) times daily. 30 g 0   COVID-19 mRNA bivalent vaccine, Pfizer, (PFIZER COVID-19 VAC BIVALENT) injection Inject into the muscle. 0.3 mL 0   esomeprazole (NEXIUM) 40 MG capsule TAKE ONE CAPSULE BY MOUTH DAILY 30 capsule 3   furosemide (LASIX) 40 MG tablet Take 0.5 tablets (20 mg total) by mouth 3 (three) times a week. Monday, Wed, Friday (Patient taking differently: Take 40 mg by mouth every Monday, Wednesday, and Friday.) 30 tablet    gabapentin (NEURONTIN) 100 MG capsule Take 2 capsules by mouth at night for neuropathy pain 180 capsule 3   metoprolol succinate (TOPROL-XL) 50 MG 24 hr tablet Take 1 tablet (50 mg total) by mouth 2 (two) times daily. Take with or immediately following a meal. 180 tablet 0   Multiple Vitamins-Minerals (CENTRUM SILVER 50+WOMEN) TABS Take 1 tablet by mouth daily with breakfast.     polyethylene glycol (MIRALAX) 17 g packet Take as directed twice daily and titrate as needed 14 each 0   predniSONE (DELTASONE) 10 MG tablet Take 10 mg by mouth daily with breakfast. Taking 2.5 tablets daily through 10/01/20 then 2 tablets daily through 10/08/20. They will then be further instructed by Dr. Baxter Flattery at [facility].     traMADol (ULTRAM) 50 MG tablet TAKE ONE TABLET BY MOUTH EVERY 6 HOURS AS NEEDED FOR UP TO 5 DAYS 30 tablet 0   valACYclovir (VALTREX) 1000 MG tablet Take 1,000 mg by mouth 3 (three) times daily. Started 03/27/20 - taking for 7 days.     levothyroxine (SYNTHROID) 50 MCG  tablet TAKE ONE TABLET BY MOUTH EVERY MORNING BEFORE BREAKFAST 90 tablet 1   No facility-administered medications prior to visit.    Allergies  Allergen Reactions   Alendronate Sodium Shortness Of Breath   Amoxicillin Diarrhea    Severe diarrhea   Codeine Nausea Only   Valsartan Swelling    Gums/throat swell    ROS Review of Systems  Constitutional:  Negative for chills and fever.  Respiratory:  Negative for cough and shortness of breath.   Cardiovascular:  Positive for leg swelling. Negative for chest pain.  Endocrine: Negative for polydipsia and polyuria.  Genitourinary:  Negative for dysuria.  Neurological:  Negative for weakness.     Objective:    Physical Exam Constitutional:      Appearance: She is well-developed.  Eyes:     Pupils: Pupils are equal, round, and reactive to light.  Neck:     Thyroid: No thyromegaly.     Vascular: No JVD.  Cardiovascular:     Rate and Rhythm: Normal rate and regular rhythm.     Heart sounds:    No gallop.  Pulmonary:     Effort: Pulmonary effort is normal. No respiratory distress.     Breath sounds: Normal breath sounds. No wheezing or rales.  Musculoskeletal:     Cervical back: Neck supple.     Right lower leg: Edema present.     Left lower leg: Edema present.  Neurological:     Mental Status: She is alert.    BP 138/60 (BP Location: Left Arm, Cuff Size: Normal)    Temp 97.8 F (36.6 C) (Oral)    SpO2 96%  Wt Readings from Last 3 Encounters:  04/25/21 125 lb (56.7 kg)  04/03/21 120 lb (54.4 kg)  09/25/20 110 lb (49.9 kg)     Health Maintenance Due  Topic Date Due   URINE MICROALBUMIN  Never done   TETANUS/TDAP  07/28/2019   INFLUENZA VACCINE  02/26/2021   COVID-19 Vaccine (4 - Booster) 07/10/2021    There are no preventive care reminders to display for this patient.  Lab Results  Component Value Date   TSH 3.390 07/18/2020   Lab Results  Component Value Date   WBC 10.3 01/18/2021   HGB 12.9 01/18/2021    HCT 38.6 01/18/2021   MCV 98.2 01/18/2021   PLT 255.0 01/18/2021   Lab Results  Component Value Date   NA 139 01/18/2021   K 4.4 01/18/2021   CO2 23 01/18/2021   GLUCOSE 172 (H) 01/18/2021   BUN 26 (H) 01/18/2021   CREATININE 1.45 (H) 01/18/2021   BILITOT 0.3 01/18/2021   ALKPHOS 57 01/18/2021   AST 20 01/18/2021   ALT 20 01/18/2021   PROT 6.1 01/18/2021   ALBUMIN 4.3 01/18/2021   CALCIUM 9.1 01/18/2021   ANIONGAP 7 01/03/2019   GFR 32.86 (L) 01/18/2021   Lab Results  Component Value Date   CHOL 148 01/17/2020   Lab Results  Component Value Date   HDL 31.30 (L) 01/17/2020   Lab Results  Component Value Date   LDLCALC 65 09/11/2018   Lab Results  Component Value Date   TRIG (H) 01/17/2020    533.0 Triglyceride is over 400; calculations on Lipids are invalid.   Lab Results  Component Value Date   CHOLHDL 5 01/17/2020   Lab Results  Component Value Date   HGBA1C 5.7 (H) 07/18/2020      Assessment & Plan:   #1 hypothyroidism.  Patient on levothyroxine 50 mcg daily.  Needing refills.  Recheck TSH.  #2 hypertension currently stable.  Continue amlodipine and metoprolol.  #3 hyperlipidemia treated with statin.  Check lipid and hepatic panel  #4 bilateral leg edema.  Suspect at least partly related to her amlodipine therapy.  Probably has some sniffing of venous stasis as well.  Elevate legs much as possible.  Continue Lasix 40 mg daily   Meds ordered this encounter  Medications   levothyroxine (SYNTHROID) 50 MCG tablet    Sig: Take 1 tablet (50 mcg total) by mouth daily with breakfast.    Dispense:  90 tablet    Refill:  1    Follow-up: No follow-ups on file.    Carolann Littler, MD

## 2021-07-19 ENCOUNTER — Other Ambulatory Visit: Payer: Self-pay | Admitting: Family Medicine

## 2021-07-24 ENCOUNTER — Ambulatory Visit (INDEPENDENT_AMBULATORY_CARE_PROVIDER_SITE_OTHER)
Admission: RE | Admit: 2021-07-24 | Discharge: 2021-07-24 | Disposition: A | Discharge status: Home / Self Care | Payer: Medicare Other | Source: Ambulatory Visit | Attending: Pulmonary Disease | Admitting: Pulmonary Disease

## 2021-07-24 ENCOUNTER — Other Ambulatory Visit: Payer: Self-pay

## 2021-07-24 DIAGNOSIS — R918 Other nonspecific abnormal finding of lung field: Secondary | ICD-10-CM

## 2021-08-08 ENCOUNTER — Telehealth: Payer: Self-pay

## 2021-08-08 ENCOUNTER — Encounter (HOSPITAL_COMMUNITY): Payer: Self-pay

## 2021-08-08 NOTE — Telephone Encounter (Signed)
-----   Message from Garner Nash, DO sent at 07/31/2021  7:27 AM EST -----  Please schedule a virtual visit with SG to discuss CT results. I am working night shift this week.   Her nodules are stable Judson Roch but she needs thyroid US setup.  Thanks  BLI     ----- Message ----- From: Interface, Rad Results In Sent: 07/25/2021   8:33 AM EST To: Garner Nash, DO

## 2021-08-08 NOTE — Telephone Encounter (Signed)
Spoke to [place] (spouse) to schedule video visit, he states they are in there 76s and don't do computer stuff, and they have a flip phone. States appt will need to be telephone visit. Appt has been scheduled for 08/13/21 at 10:30a.  Nothing further

## 2021-08-09 ENCOUNTER — Ambulatory Visit: Payer: Medicare Other | Admitting: Gastroenterology

## 2021-08-13 ENCOUNTER — Telehealth: Payer: Self-pay | Admitting: Family Medicine

## 2021-08-13 ENCOUNTER — Encounter: Payer: Self-pay | Admitting: Acute Care

## 2021-08-13 ENCOUNTER — Other Ambulatory Visit: Payer: Self-pay

## 2021-08-13 ENCOUNTER — Ambulatory Visit (INDEPENDENT_AMBULATORY_CARE_PROVIDER_SITE_OTHER): Payer: Medicare Other | Admitting: Acute Care

## 2021-08-13 DIAGNOSIS — R918 Other nonspecific abnormal finding of lung field: Secondary | ICD-10-CM

## 2021-08-13 DIAGNOSIS — E041 Nontoxic single thyroid nodule: Secondary | ICD-10-CM | POA: Diagnosis not present

## 2021-08-13 NOTE — Telephone Encounter (Signed)
Received recent report from pulmonary.  Patient had CT chest to follow-up pulmonary nodules.  Was noted to have 2.8 cm right thyroid nodule with recommended thyroid ultrasound to further assess.  This will be ordered.

## 2021-08-13 NOTE — Progress Notes (Signed)
Virtual Visit via Telephone Note  I connected with Emily Cannon on 08/13/21 at 10:30 AM EST by telephone and verified that I am speaking with the correct person using two identifiers.  Location: Patient: At home Provider: [place], [place], [place], [address]    I discussed the limitations, risks, security and privacy concerns of performing an evaluation and management service by telephone and the availability of in person appointments. I also discussed with the patient that there may be a patient responsible charge related to this service. The patient expressed understanding and agreed to proceed.  Synopsis 88 year old female, past medical history of ANCA associated vasculitis, P ANCA positivity, 1: 640 in March 2020., status post Rituxan followed by rheumatology, initially presented with kidney disease.  Had recent CT scan of the chest with small pulmonary nodules.  Patient here today for evaluation of pulmonary nodules.  Lifelong non-smoker   History of Present Illness: Pt. Presents for follow up . She had an abnormal CT Chest 12 months ago, Recommendation was for a 12 month follow up scan which was done 07/24/2021. The scan showed Multiple pulmonary nodules are unchanged or decreased in size. There was recommendation of a US Thyroid  of a 2.8 cm right thyroid nodule.She does have hypothyroidism listed as a medical condition.  I have asked the patient to follow up with PCP for US of the thyroid. I will also send him a message. Additionally I have asked the patient and her husband to follow up with his office. She states she is doing well. She has no respiratory complaints, and is relieved that her CT Chest is stable.    Observations/Objective: CT Chest 07/24/2021 Cardiovascular: Normal heart size. No pericardial effusion. Calcific atherosclerosis of the aorta and coronary arteries.   Mediastinum/Nodes: No enlarged mediastinal or axillary lymph nodes. Trachea and esophagus  demonstrate no significant findings. Similar 2.8 cm right thyroid nodule.   Lungs/Pleura: Unchanged small parenchymal band with coarse calcifications in the peripheral right middle lobe, compatible with scarring. Unchanged 1.2 cm pulmonary nodule in the left lower lobe (series 3, image 91). Previously seen pulmonary nodule in the right lower lobe appears decreased in size, now measuring approximately 3 mm on series 3, image 99 (previously 5 mm). Left upper lobe pulmonary nodule is similar versus slightly decreased in size measuring approximately 2 mm on series 3, image 56. No new pulmonary nodules identified. No consolidation. No pleural effusions or pneumothorax.   Upper Abdomen: No acute abnormality.   Musculoskeletal: Degenerative changes of the thoracic spine without evidence of acute abnormality.   IMPRESSION: 1. Multiple pulmonary nodules are unchanged or decreased in size, detailed above. 2. Redemonstrated 2.8 cm right thyroid nodule. Recommend thyroid US  Assessment and Plan: Stable small Pulmonary nodules in a never smoker  2.8 cm right Thyroid Nodule ( Needs [place]) Plan Recommended Korea of thyroid to better evaluate 2.8 cm right thyroid nodule I will message Dr. Elease Hashimoto re: [place] of thyroid No recommendation for need for further follow up of small pulmonary nodules.   Follow Up Instructions: Please contact office for sooner follow up if symptoms do not improve or worsen or seek emergency care     I discussed the assessment and treatment plan with the patient. The patient was provided an opportunity to ask questions and all were answered. The patient agreed with the plan and demonstrated an understanding of the instructions.   The patient was advised to call back or seek an in-person evaluation if the  symptoms worsen or if the condition fails to improve as anticipated.  I provided 30 minutes of non-face-to-face time during this encounter.   Magdalen Spatz, NP   08/13/2021

## 2021-08-14 ENCOUNTER — Encounter (HOSPITAL_COMMUNITY): Payer: Self-pay

## 2021-08-14 ENCOUNTER — Encounter: Payer: Self-pay | Admitting: Gastroenterology

## 2021-08-14 ENCOUNTER — Ambulatory Visit (INDEPENDENT_AMBULATORY_CARE_PROVIDER_SITE_OTHER): Payer: Medicare Other | Admitting: Gastroenterology

## 2021-08-14 VITALS — BP 148/52 | HR 70 | Ht 60.0 in | Wt 125.0 lb

## 2021-08-14 DIAGNOSIS — K219 Gastro-esophageal reflux disease without esophagitis: Secondary | ICD-10-CM

## 2021-08-14 DIAGNOSIS — K862 Cyst of pancreas: Secondary | ICD-10-CM | POA: Diagnosis not present

## 2021-08-14 DIAGNOSIS — R194 Change in bowel habit: Secondary | ICD-10-CM | POA: Diagnosis not present

## 2021-08-14 DIAGNOSIS — Z8711 Personal history of peptic ulcer disease: Secondary | ICD-10-CM

## 2021-08-14 MED ORDER — CITRUCEL PO POWD: ORAL | Status: DC

## 2021-08-14 NOTE — Progress Notes (Signed)
HPI :  88 year old female with a history of chronic rheumatic disease/microscopic polyangiitis, previously on rituximab / high dose prednisone, history of CMV gastritis and gastric ulcer, history of H. pylori and symptomatic anemia, here for follow-up visit for pancreatic cyst and altered bowel habits.   See prior notes for full details of her case.  I initially saw her in June 2020 when she presented with symptomatic anemia.  She had a gastric ulcer and CMV gastritis, severe esophageal candidiasis.  She was treated for that and recovered.  Had a follow-up endoscopy with me the following October, again had esophageal candidiasis, CMV was negative but H. pylori testing was positive and received a course of Pylera for that.  Follow-up H. pylori testing was negative.  She has since stayed on Nexium 40 mg once daily.    Recall she previously had an MRI of her T-spine which showed incidental noting of cyst in her pancreas.  She had a follow-up CT scan by her primary care in October 2021 with results as outlined below.  She has a greater than 3 cm cystic lesion in her pancreas.  I saw her about a year ago and we coordinated a follow-up MRCP of her pancreas this past October 2022 after discussion with my advanced endoscopy colleagues about EUS versus MRCP.  Follow-up MRCP is as outlined below, essentially no significant change in largest cystic lesion of the pancreas, with some smaller cysts also incidentally noted.  She denies any epigastric pain or weight loss since of last seen her.  No family history of pancreatic cancer.  We discussed how aggressive she want to be with further surveillance given her age.  She states she is off prednisone since last October.  Her vasculitis has been treated with TAVNEOS which she states she thinks is working well for this particular issue.  She has been on this for about the past year.  She has noted altered bowel habits and she has been on this regimen and thinks it is  related.  Previously she was constipated and needed MiraLAX, now she is having upwards of a few bowel movements per day which are occasionally loose and irregular.  No blood in her stool.  She is currently not taking anything for her bowels.  She has been taking Nexium 40 mg daily since have last seen her.  She is off steroids again and not using any NSAIDs.  She has some mild chronic kidney disease for which she is followed by nephrology.  She states she does have some occasional breakthrough pyrosis and epigastric discomfort in the setting of clear food triggers such as eating spaghetti.  In this setting she will use Tums as needed to help relieve her symptoms.  She is using Tylenol otherwise as needed for pains.  She feels she needs to use the Nexium as currently dosed to maintain control of her symptoms.    Prior work-up: EGD 04/29/19 -  - Diffuse, white and yellow plaques were found in the upper third of the esophagus and in the middle third of the esophagus consistent with esophageal candidiasis. - The exam of the esophagus was otherwise normal. - Patchy inflammation characterized by adherent heme, erythema and granularity was found in the gastric antrum. A focal erosion was noted at the site of the prior ulcer - ulcer has healed other than small erosion. - The exam of the stomach was otherwise normal. - Biopsies were taken with a cold forceps in the gastric body, at the  incisura and in the gastric antrum for histology - rule out CMV / H pylori. - The duodenal bulb and second portion of the duodenum were normal.   Biopsies positive for H pylori, negative for CMV - treated with Pylera with eradication testing negative   EGD 01/02/19 Esophagogastric landmarks identified. - Esophageal plaques were found, consistent with candidiasis. - Non-bleeding gastric ulcer with no stigmata of bleeding - biopsies c/w CMV gastritis - Normal duodenal bulb and second portion of the duodenum. - Biopsies  were taken with a cold forceps for Helicobacter pylori testing.   Colonoscopy 01/02/19 - Perianal dermatitis found on perianal exam. - The examined portion of the ileum was normal. - Two 3 mm polyps in the sigmoid colon, removed with a cold biopsy forceps. Resected and retrieved. - Internal hemorrhoids with inflammatory changes. - Several minutes spent lavaging the colon to achieve adequate views. - The examination was otherwise normal. Inflamed internal hemorrhoids are causing rectal bleeding, I suspect ulcer on EGD may be more likely the cause of worsening anemia with component of hemorrhoidal bleeding.   H pylori eradication testing negative 07/22/19     CT scan 04/28/20 - IMPRESSION: Two multiloculated pancreatic cysts measuring up to 3.1 cm in the pancreatic head/uncinate process. No high risk imaging features. As such, in a patient of this age, follow-up CT or MRI abdomen with/without contrast is suggested in 2 years, as clinically Warranted.   MRCP 05/23/21 - IMPRESSION: There is a thinly septated, multiloculated cystic lesion of the superior pancreatic head, measuring 3.1 x 1.5 cm, unchanged. Additional smaller lesions are present in the pancreatic head, uncinate, and body, measuring up to 1.4 x 1.0 cm in the uncinate, and 0.9 cm in the body, likewise unchanged. No associated contrast enhancement. No pancreatic ductal dilatation. These may reflect pancreatic pseudocysts, IPMNs, or serous cystic neoplasm. Consider follow-up MRI at 2 years to ensure ongoing stability, if clinically appropriate. This recommendation follows ACR consensus guidelines: Management of Incidental Pancreatic Cysts: A White Paper of the ACR Incidental Findings Committee. [place] [id number];82:[id number].    Past Medical History:  Diagnosis Date   Allergic rhinitis, cause unspecified    Allergy    Anxiety state, unspecified    Blood transfusion without reported diagnosis    2020   Cataract     removed both eyes    Chronic kidney disease    Disorder of bone and cartilage, unspecified    Esophageal candidiasis (HCC)    Foot drop    Gastric ulcer    History of CMV    History of Helicobacter pylori infection    Hypothyroid    Irritable bowel syndrome    Lumbago    Microscopic polyangiitis (HCC)    Other and unspecified hyperlipidemia    Pancreatic cyst    Perforation of tympanic membrane, unspecified    Unspecified essential hypertension      Past Surgical History:  Procedure Laterality Date   ABDOMINAL HYSTERECTOMY     BIOPSY  01/02/2019   Procedure: BIOPSY;  Surgeon: Yetta Flock, MD;  Location: [facility];  Service: Gastroenterology;;   cataract surgery  5/12 and 12/24/2012   both eyes   COLONOSCOPY     COLONOSCOPY WITH PROPOFOL N/A 01/02/2019   Procedure: COLONOSCOPY WITH PROPOFOL;  Surgeon: Yetta Flock, MD;  Location: [facility];  Service: Gastroenterology;  Laterality: N/A;   ESOPHAGOGASTRODUODENOSCOPY (EGD) WITH PROPOFOL N/A 01/02/2019   Procedure: ESOPHAGOGASTRODUODENOSCOPY (EGD) WITH PROPOFOL;  Surgeon: Yetta Flock, MD;  Location: [facility];  Service: Gastroenterology;  Laterality: N/A;   NSVD     x1   POLYPECTOMY  01/02/2019   Procedure: POLYPECTOMY;  Surgeon: Yetta Flock, MD;  Location: [facility];  Service: Gastroenterology;;   POLYPECTOMY     TONSILLECTOMY     UPPER GASTROINTESTINAL ENDOSCOPY     Family History  Problem Relation Age of Onset   Lung cancer Mother 71   Lung disease Father 55   COPD Other        sibling   Colon cancer Neg Hx    Colon polyps Neg Hx    Esophageal cancer Neg Hx    Rectal cancer Neg Hx    Stomach cancer Neg Hx    Social History   Tobacco Use   Smoking status: Never   Smokeless tobacco: Never  Vaping Use   Vaping Use: Never used  Substance Use Topics   Alcohol use: No   Drug use: No   Current Outpatient Medications  Medication Sig Dispense Refill   amLODipine (NORVASC) 10  MG tablet TAKE ONE TABLET BY MOUTH DAILY 90 tablet 3   atorvastatin (LIPITOR) 20 MG tablet TAKE ONE TABLET BY MOUTH DAILY 90 tablet 3   Avacopan (TAVNEOS) 10 MG CAPS Take 3 capsules by mouth twice daily with meals.     Cholecalciferol 25 MCG (1000 UT) tablet Take 1 capsule by mouth daily.     clotrimazole-betamethasone (LOTRISONE) cream Apply 1 application topically 2 (two) times daily. 30 g 0   COVID-19 mRNA bivalent vaccine, Pfizer, (PFIZER COVID-19 VAC BIVALENT) injection Inject into the muscle. 0.3 mL 0   esomeprazole (NEXIUM) 40 MG capsule TAKE ONE CAPSULE BY MOUTH DAILY 30 capsule 3   furosemide (LASIX) 40 MG tablet Take 0.5 tablets (20 mg total) by mouth 3 (three) times a week. Monday, Wed, Friday (Patient taking differently: Take 40 mg by mouth every Monday, Wednesday, and Friday.) 30 tablet    gabapentin (NEURONTIN) 100 MG capsule Take 2 capsules by mouth at night for neuropathy pain 180 capsule 3   levothyroxine (SYNTHROID) 50 MCG tablet TAKE ONE TABLET BY MOUTH EVERY MORNING BEFORE BREAKFAST 90 tablet 1   methylcellulose (CITRUCEL) oral powder Once daily     metoprolol succinate (TOPROL-XL) 50 MG 24 hr tablet TAKE ONE TABLET BY MOUTH TWICE A DAY WITH OR IMMEDIATELY FOLLOWING A MEAL 180 tablet 0   Multiple Vitamins-Minerals (CENTRUM SILVER 50+WOMEN) TABS Take 1 tablet by mouth daily with breakfast.     traMADol (ULTRAM) 50 MG tablet TAKE ONE TABLET BY MOUTH EVERY 6 HOURS AS NEEDED FOR UP TO 5 DAYS 30 tablet 0   No current facility-administered medications for this visit.   Allergies  Allergen Reactions   Alendronate Sodium Shortness Of Breath   Amoxicillin Diarrhea    Severe diarrhea   Codeine Nausea Only   Valsartan Swelling    Gums/throat swell     Review of Systems: All systems reviewed and negative except where noted in HPI.    Lab Results  Component Value Date   WBC 10.3 01/18/2021   HGB 12.9 01/18/2021   HCT 38.6 01/18/2021   MCV 98.2 01/18/2021   PLT 255.0  01/18/2021    Lab Results  Component Value Date   CREATININE 1.45 (H) 01/18/2021   BUN 26 (H) 01/18/2021   NA 139 01/18/2021   K 4.4 01/18/2021   CL 106 01/18/2021   CO2 23 01/18/2021    Lab Results  Component Value Date  ALT 15 07/17/2021   AST 21 07/17/2021   ALKPHOS 103 07/17/2021   BILITOT 0.4 07/17/2021     Physical Exam: BP (!) 148/52    Pulse 70    Ht 5' (1.524 m)    Wt 125 lb (56.7 kg)    BMI 24.41 kg/m  Constitutional: Pleasant,well-developed, female in no acute distress. Neurological: Alert and oriented to person place and time. Psychiatric: Normal mood and affect. Behavior is normal.   ASSESSMENT AND PLAN: 88 year old female here for reassessment of the following:  Pancreatic cyst Altered bowel habits GERD History of gastric ulcer  As above the patient has a stable slightly greater than 3 cm multiloculated cystic lesion in the pancreatic head with a smaller cysts elsewhere in the pancreas, all unchanged since her last exam, no high risk features based on recent MRCP.  Patient appears asymptomatic from this.  We discussed pancreatic cysts in general, risk for malignant change over time with larger cysts.  We discussed if she wished to have any further surveillance of this at all given her age.  While she is not certain she would want surgery if she did develop pancreatic cancer, she would want to know if she had a high risk feature or at higher risk for pancreatic cancer.  In this light she does want to consider another surveillance exam moving forward, we will consider an MRCP in 1 to 2 years pending her course.  I will see her in 1 year for reassessment of this.  Otherwise we discussed her bowel habits, suspect related to Tavneos, she is off her MiraLAX but having some erratic bowel habits.  Recommend trial of Citrucel once daily to see if that will help provide some regularity.  She will contact me if this is not works we can discuss other options.  We discussed  her history of gastric ulcers that occurred in the setting of CMV gastritis and high-dose prednisone in the past.  She has been on Nexium for this and her history of GERD.  Currently has fair control of symptoms but specific food triggers can cause breakthrough for which she takes Tums.  We discussed long-term risks of chronic PPI use, especially increased risk for CKD.  She is seeing her nephrologist about this.  I am fearful she will have significant reflux symptoms if we reduce her dose of Nexium currently, she wants to continue it for now, but can further discuss with her nephrologist.  We will not increase dose at this time otherwise unless her breakthrough is significant.  Plan: - follow up in one year, consider repeat MRCP in 1-2 years - trial of Citrucel once daily - continue nexium as currently dosed, renal function to be monitored by nephrologist, as above  Jolly Mango, MD [facility]

## 2021-08-14 NOTE — Patient Instructions (Addendum)
If you are age 86 or older, your body mass index should be between 23-30. Your Body mass index is 24.41 kg/m. If this is out of the aforementioned range listed, please consider follow up with your Primary Care Provider.  If you are age 13 or younger, your body mass index should be between 19-25. Your Body mass index is 24.41 kg/m. If this is out of the aformentioned range listed, please consider follow up with your Primary Care Provider.   ________________________________________________________  The [facility] providers would like to encourage you to use [facility] to communicate with providers for non-urgent requests or questions.  Due to long hold times on the telephone, sending your provider a message by [facility] may be a faster and more efficient way to get a response.  Please allow 48 business hours for a response.  Please remember that this is for non-urgent requests.  _______________________________________________________  Continue Nexium. Let us know when you need a refill.  Please purchase the following medications over the counter and take as directed: Citrucel - Take daily  Please follow up in 1 year.  Thank you for entrusting me with your care and for choosing [facility], Dr. [place] Cellar

## 2021-08-15 ENCOUNTER — Other Ambulatory Visit: Payer: Self-pay | Admitting: Family Medicine

## 2021-08-28 ENCOUNTER — Ambulatory Visit
Admission: RE | Admit: 2021-08-28 | Discharge: 2021-08-28 | Disposition: A | Discharge status: Home / Self Care | Payer: Medicare Other | Source: Ambulatory Visit | Attending: Family Medicine | Admitting: Family Medicine

## 2021-08-28 DIAGNOSIS — E041 Nontoxic single thyroid nodule: Secondary | ICD-10-CM

## 2021-08-29 NOTE — Telephone Encounter (Signed)
Referral placed to interventional radiology for fine-needle aspiration biopsy of right thyroid nodule.  Patient aware.

## 2021-08-29 NOTE — Addendum Note (Signed)
Addended by: Eulas Post on: 08/29/2021 07:34 AM   Modules accepted: Orders

## 2021-09-12 ENCOUNTER — Telehealth: Payer: Self-pay | Admitting: Gastroenterology

## 2021-09-12 ENCOUNTER — Telehealth: Payer: Self-pay | Admitting: Family Medicine

## 2021-09-12 DIAGNOSIS — E041 Nontoxic single thyroid nodule: Secondary | ICD-10-CM

## 2021-09-12 NOTE — Telephone Encounter (Signed)
FYI

## 2021-09-12 NOTE — Telephone Encounter (Signed)
Inbound call from patient husband. States in January patient was told about a referral for biopsy. Patient want to follow up because they have not heard anything

## 2021-09-12 NOTE — Telephone Encounter (Signed)
Patient husband called in to tell Dr.Burchette that [place] wanted patient to get a biopsy because he found something on thyroid.  Patient spouse could be contacted at [phone].  Please advise.

## 2021-09-12 NOTE — Telephone Encounter (Signed)
Pt had an [place] on 08/28/21, ambulatory referral to IR was placed by patient's PCP. I returned call to patient's husband and informed him of this information. I told him to reach out to Dr. Erick Blinks office to see if they can follow up on this for them. Pt's husband verbalized understanding and had no concerns at the end of the call.

## 2021-09-13 ENCOUNTER — Telehealth: Payer: Self-pay | Admitting: Gastroenterology

## 2021-09-13 NOTE — Telephone Encounter (Signed)
Returned call to patient's husband. He reports that patient still has ongoing diarrhea, it has not improved. He reports that the diarrhea causes the patient to have nausea, dry heaves, and feel faint. He states that she takes Kaopectate which helps some but he states that he read that it is not good for long-term use. I asked the patient's husband if she has tried the Citrucel that was recommended. He states that he looked for it but has not been able to find it anywhere. He states that he will call around again to a few pharmacies to see if they have it. I explained to him that the pt should try the Citrucel daily for at least a week and see how she does. He knows that this will help bulk up her stool and hopefully provide some consistency to her bowel movements. Pt's husband knows that if pt does not have any improvement after trying the Citrucel to call us back. Pt's husband verbalized understanding and had no concerns at the end of the call.

## 2021-09-13 NOTE — Telephone Encounter (Signed)
Spoke with the patient's husband. He is aware this has been corrected and that someone will be reaching out to get her scheduled.

## 2021-09-13 NOTE — Telephone Encounter (Signed)
Emily Cannon has gotten back to me and states that per the hospital this has to be an order not a referral.  Please advise.

## 2021-09-13 NOTE — Telephone Encounter (Signed)
Inbound call from patient husband states patient is feeling very faint, have diarrhea, and upset stomach. Best contact number [phone]

## 2021-09-13 NOTE — Telephone Encounter (Signed)
I do not see that anything has been done with this referral. Reaching out to referral coordinator Ophelia Charter for assistance.

## 2021-09-13 NOTE — Telephone Encounter (Signed)
Please confirm with them that we entered correctly.

## 2021-09-13 NOTE — Telephone Encounter (Signed)
Left message for patient to call back  

## 2021-09-17 ENCOUNTER — Other Ambulatory Visit (HOSPITAL_COMMUNITY)
Admission: RE | Admit: 2021-09-17 | Discharge: 2021-09-17 | Disposition: A | Discharge status: Home / Self Care | Payer: Medicare Other | Source: Ambulatory Visit | Attending: Physician Assistant | Admitting: Physician Assistant

## 2021-09-17 ENCOUNTER — Telehealth: Payer: Self-pay | Admitting: Gastroenterology

## 2021-09-17 ENCOUNTER — Ambulatory Visit
Admission: RE | Admit: 2021-09-17 | Discharge: 2021-09-17 | Disposition: A | Discharge status: Home / Self Care | Payer: Medicare Other | Source: Ambulatory Visit | Attending: Family Medicine | Admitting: Family Medicine

## 2021-09-17 DIAGNOSIS — E041 Nontoxic single thyroid nodule: Secondary | ICD-10-CM

## 2021-09-17 NOTE — Telephone Encounter (Signed)
Left message on machine to call back to discuss further

## 2021-09-17 NOTE — Telephone Encounter (Signed)
Inbound call from pt's husband requesting a call back stating that he was told to give his wife Citrucel and she is having diarrhea. Pt's husband would like to know what could he give her to stop the diarrhea. Mr. Ryback did advise that he has to take his wife for an appt today and if he doesn't pick up, you can leave a detailed message for him. Please advise. Thank you.

## 2021-09-18 LAB — CYTOLOGY - NON PAP

## 2021-09-18 NOTE — Telephone Encounter (Signed)
Patient's husband returned your call.  Please call back.  Thank you.

## 2021-09-18 NOTE — Telephone Encounter (Signed)
Returned call to patient's husband. He states that the pt tried the Citrucel for a a couple of days and she told him that her symptoms seemed to improve some but then she went back to having diarrhea. Pt's husband states that the pharmacist told him to stop giving her the Citrucel since it is for constipation and she is having diarrhea. I told pt's husband that the fiber can help bulk up the stool and add consistency. She has not had the Citrucel in about 2 days. He reports that the amount of episodes of diarrhea varies, he states that she can have anywhere from 3-5 episodes a day. He states that if you want her on the Citrucel he will start her back on it, he just wants to be sure that he is doing the correct thing. Please advise, thanks.

## 2021-09-18 NOTE — Telephone Encounter (Signed)
Thanks [employer]. Last I saw her she was on Tavneos and having more erratic bowel habits but not always diarrhea, we tried Citrucel to provide some irregularity. Historically she has had constipation. If it is not helping and more so loose stools she can stop the Citrucel and try immodium once in the AM and titrate up as needed. If no benefit and symptoms persist she should see me back in the office. Thanks

## 2021-09-18 NOTE — Telephone Encounter (Signed)
Called and spoke with patient's husband regarding Dr. Doyne Keel recommendations. He is aware that if Citrucel helps then pt can continue and if not then the pt would need to try the Imodium. Pt's husband is aware to contact us if symptoms do not improve. He knows that pt will need an appt if she has continued symptoms despite the recommendations given. Pt's husband verbalized understanding and had no concerns at the end of the call.

## 2021-09-19 ENCOUNTER — Other Ambulatory Visit: Payer: Self-pay

## 2021-09-19 DIAGNOSIS — D34 Benign neoplasm of thyroid gland: Secondary | ICD-10-CM

## 2021-09-25 ENCOUNTER — Telehealth: Payer: Self-pay | Admitting: Family Medicine

## 2021-09-25 ENCOUNTER — Telehealth: Payer: Self-pay | Admitting: Gastroenterology

## 2021-09-25 NOTE — Telephone Encounter (Signed)
Returned call to patient's husband. He states that he stopped the Citrucel this morning. He states that patient had diarrhea for about 3 days along with abdominal cramping and dry heaves. Pt has not tried the Imodium. I gave patient's husband Dr. Doyne Keel previous recommendations regarding Imodium. He can give patient 1 Imodium in the AM and titrate up as needed. He questioned if the pt could try IB Donald Prose, I told him that is also dine for her to try for the abdominal cramping. Pt's husband knows to call ys back if no improvement and at that time we will need to get patient scheduled for an office visit. Pt's husband verbalized understanding and had no concerns at the end of the call.

## 2021-09-25 NOTE — Telephone Encounter (Signed)
Patient's husband called stating he had to take her off the [employer] as she was experiencing diarrhea, cramps and dry heaves.  Please call to advise what her next step should be.  Thank you.

## 2021-09-25 NOTE — Telephone Encounter (Signed)
Pt's husband states pt has appt on 10/16/21 at 1 PM with Dr. Harlow Asa for her thyroid.  Dr. Elease Hashimoto had inquired about this.   No surgery scheduled yet.

## 2021-09-25 NOTE — Telephone Encounter (Signed)
Pt husband is returning Emily Cannon call

## 2021-09-25 NOTE — Telephone Encounter (Signed)
No answer at the patient's home number. 

## 2021-09-26 NOTE — Telephone Encounter (Signed)
Spoke with the patient's husband and informed him of the message below. ?

## 2021-09-30 ENCOUNTER — Other Ambulatory Visit: Payer: Self-pay | Admitting: Gastroenterology

## 2021-10-05 ENCOUNTER — Encounter (HOSPITAL_COMMUNITY): Payer: Self-pay

## 2021-10-19 ENCOUNTER — Other Ambulatory Visit: Payer: Self-pay | Admitting: Family Medicine

## 2021-10-19 ENCOUNTER — Other Ambulatory Visit: Payer: Self-pay

## 2021-10-19 DIAGNOSIS — I10 Essential (primary) hypertension: Secondary | ICD-10-CM

## 2021-10-19 MED ORDER — METOPROLOL SUCCINATE ER 50 MG PO TB24: ORAL_TABLET | ORAL | 0 refills | Status: DC

## 2021-10-29 ENCOUNTER — Ambulatory Visit (INDEPENDENT_AMBULATORY_CARE_PROVIDER_SITE_OTHER): Payer: Medicare Other | Admitting: Family Medicine

## 2021-10-29 ENCOUNTER — Encounter: Payer: Self-pay | Admitting: Family Medicine

## 2021-10-29 VITALS — BP 132/60 | HR 61 | Temp 97.9°F | Ht 60.0 in | Wt 126.7 lb

## 2021-10-29 DIAGNOSIS — I10 Essential (primary) hypertension: Secondary | ICD-10-CM

## 2021-10-29 DIAGNOSIS — L853 Xerosis cutis: Secondary | ICD-10-CM | POA: Diagnosis not present

## 2021-10-29 DIAGNOSIS — R300 Dysuria: Secondary | ICD-10-CM | POA: Diagnosis not present

## 2021-10-29 LAB — POCT URINALYSIS DIPSTICK
Bilirubin, UA: NEGATIVE
Blood, UA: NEGATIVE
Glucose, UA: NEGATIVE
Ketones, UA: NEGATIVE
Nitrite, UA: NEGATIVE
Protein, UA: POSITIVE — AB
Spec Grav, UA: 1.02 (ref 1.010–1.025)
Urobilinogen, UA: 0.2 E.U./dL
pH, UA: 6 (ref 5.0–8.0)

## 2021-10-29 MED ORDER — TRIAMCINOLONE ACETONIDE 0.1 % EX CREA: 1.0000 "application " | TOPICAL_CREAM | Freq: Two times a day (BID) | CUTANEOUS | 1 refills | Status: DC

## 2021-10-29 NOTE — Patient Instructions (Signed)
Continue with Dove soap ? ?Avoid scratching as much as possible ? ?Avoid prolonged showers and use cooler water if possible ? ?Apply the medicated cream twice daily as needed ? ?May try over the counter Claritan once daily.   ?

## 2021-10-29 NOTE — Progress Notes (Signed)
? ?Established Patient Office Visit ? ?Subjective:  ?Patient ID: Emily Cannon, female    DOB: 1933-07-14  Age: 88 y.o. MRN: [id number] ? ?CC:  ?Chief Complaint  ?Patient presents with  ? Rash  ?  Patient complains of rash, x1 month   ? Dysuria  ?  Patient complains of dysuria, x2 weeks   ? ? ?HPI ?Emily Cannon presents for the following items ? ?Pruritic skin rash mostly involving her forearms and upper back for the past month or so.  She uses [employer].  She has been scratching frequently.  Pruritus is severe at times.  No recent change of soap or detergent.  Has not tried anything topically.  Not routinely using moisturizers.  Usually takes showers. ? ?Second issue is about 2-week history of some burning with urination.  No gross hematuria.  No fevers or chills.  Symptoms inconsistent. ? ?She has history of microscopic polyangiitis followed by rheumatology.  She has hypertension and is currently treated with amlodipine and Toprol-XL.  At one point had been on hydralazine but there was concern that this may have been triggering factor for her autoimmune issues. ? ?Past Medical History:  ?Diagnosis Date  ? Allergic rhinitis, cause unspecified   ? Allergy   ? Anxiety state, unspecified   ? Blood transfusion without reported diagnosis   ? 2020  ? Cataract   ? removed both eyes   ? Chronic kidney disease   ? Disorder of bone and cartilage, unspecified   ? Esophageal candidiasis ([place])   ? Foot drop   ? Gastric ulcer   ? History of CMV   ? History of Helicobacter pylori infection   ? Hypothyroid   ? Irritable bowel syndrome   ? Lumbago   ? Microscopic polyangiitis ([place])   ? Other and unspecified hyperlipidemia   ? Pancreatic cyst   ? Perforation of tympanic membrane, unspecified   ? Unspecified essential hypertension   ? ? ?Past Surgical History:  ?Procedure Laterality Date  ? ABDOMINAL HYSTERECTOMY    ? BIOPSY  01/02/2019  ? Procedure: BIOPSY;  Surgeon: Yetta Flock, MD;  Location: [facility] ENDOSCOPY;  Service:  Gastroenterology;;  ? cataract surgery  5/12 and 12/24/2012  ? both eyes  ? COLONOSCOPY    ? COLONOSCOPY WITH PROPOFOL N/A 01/02/2019  ? Procedure: COLONOSCOPY WITH PROPOFOL;  Surgeon: Yetta Flock, MD;  Location: [place];  Service: Gastroenterology;  Laterality: N/A;  ? ESOPHAGOGASTRODUODENOSCOPY (EGD) WITH PROPOFOL N/A 01/02/2019  ? Procedure: ESOPHAGOGASTRODUODENOSCOPY (EGD) WITH PROPOFOL;  Surgeon: Yetta Flock, MD;  Location: [place];  Service: Gastroenterology;  Laterality: N/A;  ? NSVD    ? x1  ? POLYPECTOMY  01/02/2019  ? Procedure: POLYPECTOMY;  Surgeon: Yetta Flock, MD;  Location: [facility] ENDOSCOPY;  Service: Gastroenterology;;  ? POLYPECTOMY    ? TONSILLECTOMY    ? UPPER GASTROINTESTINAL ENDOSCOPY    ? ? ?Family History  ?Problem Relation Age of Onset  ? Lung cancer Mother 82  ? Lung disease Father 29  ? COPD Other   ?     sibling  ? Colon cancer Neg Hx   ? Colon polyps Neg Hx   ? Esophageal cancer Neg Hx   ? Rectal cancer Neg Hx   ? Stomach cancer Neg Hx   ? ? ?Social History  ? ?Socioeconomic History  ? Marital status: Married  ?  Spouse name: Jeneen Rinks  ? Number of children: 2  ? Years of education: 68  ?  Highest education level: High school graduate  ?Occupational History  ? Occupation: retired Research scientist (physical sciences)  ?Tobacco Use  ? Smoking status: Never  ? Smokeless tobacco: Never  ?Vaping Use  ? Vaping Use: Never used  ?Substance and Sexual Activity  ? Alcohol use: No  ? Drug use: No  ? Sexual activity: Not Currently  ?Other Topics Concern  ? Not on file  ?Social History Narrative  ? Lives at home with her husband.  ? Right-handed.  ? 1-2 cups coffee daily, occasional tea or soda.  ? ?Social Determinants of Health  ? ?Financial Resource Strain: Not on file  ?Food Insecurity: Not on file  ?Transportation Needs: Not on file  ?Physical Activity: Not on file  ?Stress: Not on file  ?Social Connections: Not on file  ?Intimate Partner Violence: Not on file  ? ? ?Outpatient Medications Prior to  Visit  ?Medication Sig Dispense Refill  ? amLODipine (NORVASC) 10 MG tablet TAKE ONE TABLET BY MOUTH DAILY 90 tablet 3  ? atorvastatin (LIPITOR) 20 MG tablet TAKE ONE TABLET BY MOUTH DAILY 90 tablet 3  ? Avacopan (TAVNEOS) 10 MG CAPS Take 3 capsules by mouth twice daily with meals.    ? Cholecalciferol 25 MCG (1000 UT) tablet Take 1 capsule by mouth daily.    ? clotrimazole-betamethasone (LOTRISONE) cream Apply 1 application topically 2 (two) times daily. 30 g 0  ? COVID-19 mRNA bivalent vaccine, Pfizer, (PFIZER COVID-19 VAC BIVALENT) injection Inject into the muscle. 0.3 mL 0  ? esomeprazole (NEXIUM) 40 MG capsule TAKE ONE CAPSULE BY MOUTH DAILY 90 capsule 1  ? furosemide (LASIX) 40 MG tablet Take 0.5 tablets (20 mg total) by mouth 3 (three) times a week. Monday, Wed, Friday (Patient taking differently: Take 40 mg by mouth every Monday, Wednesday, and Friday.) 30 tablet   ? gabapentin (NEURONTIN) 100 MG capsule Take 2 capsules by mouth at night for neuropathy pain 180 capsule 3  ? levothyroxine (SYNTHROID) 50 MCG tablet TAKE ONE TABLET BY MOUTH EVERY MORNING BEFORE BREAKFAST 90 tablet 1  ? metoprolol succinate (TOPROL-XL) 50 MG 24 hr tablet TAKE ONE TABLET BY MOUTH TWICE A DAY WITH OR IMMEDIATELY FOLLOWING A MEAL 180 tablet 0  ? Multiple Vitamins-Minerals (CENTRUM SILVER 50+WOMEN) TABS Take 1 tablet by mouth daily with breakfast.    ? traMADol (ULTRAM) 50 MG tablet TAKE ONE TABLET BY MOUTH EVERY 6 HOURS AS NEEDED FOR UP TO 5 DAYS 30 tablet 0  ? methylcellulose (CITRUCEL) oral powder Once daily    ? ?No facility-administered medications prior to visit.  ? ? ?Allergies  ?Allergen Reactions  ? Alendronate Sodium Shortness Of Breath  ? Amoxicillin Diarrhea  ?  Severe diarrhea  ? Codeine Nausea Only  ? Valsartan Swelling  ?  Gums/throat swell  ? ? ?ROS ?Review of Systems  ?Constitutional:  Negative for appetite change, chills and fever.  ?Gastrointestinal:  Negative for abdominal pain, constipation, diarrhea, nausea and  vomiting.  ?Genitourinary:  Positive for dysuria and frequency.  ?Musculoskeletal:  Negative for back pain.  ?Skin:  Positive for rash.  ?Neurological:  Negative for dizziness.  ? ?  ?Objective:  ?  ?Physical Exam ?Vitals reviewed.  ?Constitutional:   ?   Appearance: Normal appearance.  ?Cardiovascular:  ?   Rate and Rhythm: Normal rate and regular rhythm.  ?Pulmonary:  ?   Effort: Pulmonary effort is normal.  ?   Breath sounds: Normal breath sounds.  ?Skin: ?   Findings: Rash present.  ?  Comments: Generally dry skin.  She has several areas of nonspecific dry skin dermatitis with excoriations especially involving her forearms, arms, and upper back region.  No pustules.  No vesicles.  ?Neurological:  ?   Mental Status: She is alert.  ? ? ?BP 132/60 (BP Location: Left Arm, Patient Position: Sitting, Cuff Size: Normal)   Pulse 61   Temp 97.9 ?F (36.6 ?C) (Oral)   Ht 5' (1.524 m)   Wt 126 lb 11.2 oz (57.5 kg)   SpO2 95%   BMI 24.74 kg/m?  ?Wt Readings from Last 3 Encounters:  ?10/29/21 126 lb 11.2 oz (57.5 kg)  ?08/14/21 125 lb (56.7 kg)  ?04/25/21 125 lb (56.7 kg)  ? ? ? ?Health Maintenance Due  ?Topic Date Due  ? URINE MICROALBUMIN  Never done  ? TETANUS/TDAP  07/28/2019  ? COVID-19 Vaccine (4 - Booster) 07/10/2021  ? ? ?There are no preventive care reminders to display for this patient. ? ?Lab Results  ?Component Value Date  ? TSH 2.85 07/17/2021  ? ?Lab Results  ?Component Value Date  ? WBC 10.3 01/18/2021  ? HGB 12.9 01/18/2021  ? HCT 38.6 01/18/2021  ? MCV 98.2 01/18/2021  ? PLT 255.0 01/18/2021  ? ?Lab Results  ?Component Value Date  ? NA 139 01/18/2021  ? K 4.4 01/18/2021  ? CO2 23 01/18/2021  ? GLUCOSE 172 (H) 01/18/2021  ? BUN 26 (H) 01/18/2021  ? CREATININE 1.45 (H) 01/18/2021  ? BILITOT 0.4 07/17/2021  ? ALKPHOS 103 07/17/2021  ? AST 21 07/17/2021  ? ALT 15 07/17/2021  ? PROT 6.4 07/17/2021  ? ALBUMIN 4.3 07/17/2021  ? CALCIUM 9.1 01/18/2021  ? ANIONGAP 7 01/03/2019  ? GFR 32.86 (L) 01/18/2021   ? ?Lab Results  ?Component Value Date  ? CHOL 163 07/17/2021  ? ?Lab Results  ?Component Value Date  ? HDL 36.30 (L) 07/17/2021  ? ?Lab Results  ?Component Value Date  ? [place] 65 09/11/2018  ? ?Lab Results  ?Com

## 2021-11-01 LAB — URINE CULTURE
MICRO NUMBER:: 13214384
SPECIMEN QUALITY:: ADEQUATE

## 2021-11-05 ENCOUNTER — Telehealth: Payer: Self-pay

## 2021-11-05 MED ORDER — NITROFURANTOIN MONOHYD MACRO 100 MG PO CAPS: ORAL_CAPSULE | ORAL | 0 refills | Status: DC

## 2021-11-05 NOTE — Addendum Note (Signed)
Addended by: Nilda Riggs on: 11/05/2021 08:18 AM ? ? Modules accepted: Orders ? ?

## 2021-11-05 NOTE — Telephone Encounter (Signed)
-----   Message from Eulas Post, MD sent at 11/02/2021  2:04 PM EDT ----- ?Urine cx enterococcus species.   She cannot take Ampicillin.   Send in [place] one po bid for 5 days.  ?----- Message ----- ?From: Pearline Yerby L, CMA ?Sent: 10/29/2021   3:48 PM EDT ?To: Eulas Post, MD ? ? ?

## 2022-01-15 ENCOUNTER — Other Ambulatory Visit: Payer: Self-pay | Admitting: Family Medicine

## 2022-01-15 DIAGNOSIS — I10 Essential (primary) hypertension: Secondary | ICD-10-CM

## 2022-02-14 ENCOUNTER — Encounter (HOSPITAL_COMMUNITY): Payer: Self-pay

## 2022-02-14 ENCOUNTER — Telehealth: Payer: Self-pay | Admitting: Family Medicine

## 2022-02-14 NOTE — Telephone Encounter (Signed)
Left message for patient to call back and schedule Medicare Annual Wellness Visit (AWV) either virtually or in office. Left  my Herbie Drape number [phone]   Last AWV ;08/23/19  please schedule at anytime with [facility] Nurse Health Advisor 1 or 2

## 2022-02-20 ENCOUNTER — Ambulatory Visit (INDEPENDENT_AMBULATORY_CARE_PROVIDER_SITE_OTHER): Payer: Medicare Other

## 2022-02-20 VITALS — Ht 60.0 in | Wt 126.0 lb

## 2022-02-20 DIAGNOSIS — Z0000 Encounter for general adult medical examination without abnormal findings: Secondary | ICD-10-CM

## 2022-02-20 NOTE — Progress Notes (Signed)
Subjective:   Emily Cannon is a 88 y.o. female who presents for Medicare Annual (Subsequent) preventive examination.  Review of Systems    Virtual Visit via Telephone Note  I connected with  Emily Cannon on 02/20/22 at 12:30 PM EDT by telephone and verified that I am speaking with the correct person using two identifiers.  Location: Patient: Home Provider: Office Persons participating in the virtual visit: patient/Nurse Health Advisor   I discussed the limitations, risks, security and privacy concerns of performing an evaluation and management service by telephone and the availability of in person appointments. The patient expressed understanding and agreed to proceed.  Interactive audio and video telecommunications were attempted between this nurse and patient, however failed, due to patient having technical difficulties OR patient did not have access to video capability.  We continued and completed visit with audio only.  Some vital signs may be absent or patient reported.   Criselda Peaches, LPN  Cardiac Risk Factors include: advanced age (>22mn, >>6women);hypertension     Objective:    Today's Vitals   02/20/22 1234  Weight: 126 lb (57.2 kg)  Height: 5' (1.524 m)   Body mass index is 24.61 kg/m.     02/20/2022   12:59 PM 10/06/2020    1:50 PM 08/23/2019   10:47 AM 04/14/2019    1:55 PM 12/31/2018    1:59 AM 12/30/2018    5:51 PM 12/15/2018    2:49 PM  Advanced Directives  Does Patient Have a Medical Advance Directive? Yes No No No No No No  Type of [place]        Does patient want to make changes to medical advance directive? No - Patient declined        [place]? No - copy requested        Would patient like information on creating a medical advance directive?  No - Patient declined No - Patient declined No - Patient declined No - Patient declined  No - Patient declined     Current Medications (verified) Outpatient Encounter Medications as of 02/20/2022  Medication Sig   amLODipine (NORVASC) 5 MG tablet Take 5 mg by mouth daily.   amLODipine (NORVASC) 10 MG tablet TAKE ONE TABLET BY MOUTH DAILY (Patient not taking: Reported on 02/20/2022)   atorvastatin (LIPITOR) 20 MG tablet TAKE ONE TABLET BY MOUTH DAILY   Avacopan (TAVNEOS) 10 MG CAPS Take 3 capsules by mouth twice daily with meals.   Cholecalciferol 25 MCG (1000 UT) tablet Take 1 capsule by mouth daily.   clotrimazole-betamethasone (LOTRISONE) cream Apply 1 application topically 2 (two) times daily.   COVID-19 mRNA bivalent vaccine, Pfizer, (PFIZER COVID-19 VAC BIVALENT) injection Inject into the muscle.   esomeprazole (NEXIUM) 40 MG capsule TAKE ONE CAPSULE BY MOUTH DAILY   furosemide (LASIX) 40 MG tablet Take 0.5 tablets (20 mg total) by mouth 3 (three) times a week. Monday, Wed, Friday (Patient taking differently: Take 40 mg by mouth every Monday, Wednesday, and Friday.)   gabapentin (NEURONTIN) 100 MG capsule Take 2 capsules by mouth at night for neuropathy pain   levothyroxine (SYNTHROID) 50 MCG tablet TAKE ONE TABLET BY MOUTH EVERY MORNING BEFORE BREAKFAST   metoprolol succinate (TOPROL-XL) 50 MG 24 hr tablet TAKE ONE TABLET BY MOUTH TWICE A DAY WITH A MEAL OR IMMEDIATELY FOLLOWING A MEAL   Multiple Vitamins-Minerals (CENTRUM SILVER 50+WOMEN) TABS Take 1 tablet by  mouth daily with breakfast.   nitrofurantoin, macrocrystal-monohydrate, (MACROBID) 100 MG capsule Take 1 capsule (100 mg total) by mouth 2 (two) times daily for 5 days   traMADol (ULTRAM) 50 MG tablet TAKE ONE TABLET BY MOUTH EVERY 6 HOURS AS NEEDED FOR UP TO 5 DAYS   triamcinolone cream (KENALOG) 0.1 % Apply 1 application. topically 2 (two) times daily. Compound in 1:1 ratio with Eucerin and apply to affected rash twice daily as  needed.   No facility-administered encounter medications on file as of 02/20/2022.    Allergies  (verified) Alendronate sodium, Amoxicillin, Codeine, and Valsartan   History: Past Medical History:  Diagnosis Date   Allergic rhinitis, cause unspecified    Allergy    Anxiety state, unspecified    Blood transfusion without reported diagnosis    2020   Cataract    removed both eyes    Chronic kidney disease    Disorder of bone and cartilage, unspecified    Esophageal candidiasis (HCC)    Foot drop    Gastric ulcer    History of CMV    History of Helicobacter pylori infection    Hypothyroid    Irritable bowel syndrome    Lumbago    Microscopic polyangiitis (HCC)    Other and unspecified hyperlipidemia    Pancreatic cyst    Perforation of tympanic membrane, unspecified    Unspecified essential hypertension    Past Surgical History:  Procedure Laterality Date   ABDOMINAL HYSTERECTOMY     BIOPSY  01/02/2019   Procedure: BIOPSY;  Surgeon: Yetta Flock, MD;  Location: [facility];  Service: Gastroenterology;;   cataract surgery  5/12 and 12/24/2012   both eyes   COLONOSCOPY     COLONOSCOPY WITH PROPOFOL N/A 01/02/2019   Procedure: COLONOSCOPY WITH PROPOFOL;  Surgeon: Yetta Flock, MD;  Location: [facility] ENDOSCOPY;  Service: Gastroenterology;  Laterality: N/A;   ESOPHAGOGASTRODUODENOSCOPY (EGD) WITH PROPOFOL N/A 01/02/2019   Procedure: ESOPHAGOGASTRODUODENOSCOPY (EGD) WITH PROPOFOL;  Surgeon: Yetta Flock, MD;  Location: [place];  Service: Gastroenterology;  Laterality: N/A;   NSVD     x1   POLYPECTOMY  01/02/2019   Procedure: POLYPECTOMY;  Surgeon: Yetta Flock, MD;  Location: [facility] ENDOSCOPY;  Service: Gastroenterology;;   POLYPECTOMY     TONSILLECTOMY     UPPER GASTROINTESTINAL ENDOSCOPY     Family History  Problem Relation Age of Onset   Lung cancer Mother 77   Lung disease Father 23   COPD Other        sibling   Colon cancer Neg Hx    Colon polyps Neg Hx    Esophageal cancer Neg Hx    Rectal cancer Neg Hx    Stomach cancer Neg Hx     Social History   Socioeconomic History   Marital status: Married    Spouse name: Jeneen Rinks   Number of children: 2   Years of education: 12   Highest education level: High school graduate  Occupational History   Occupation: retired Research scientist (physical sciences)  Tobacco Use   Smoking status: Never   Smokeless tobacco: Never  Vaping Use   Vaping Use: Never used  Substance and Sexual Activity   Alcohol use: No   Drug use: No   Sexual activity: Not Currently  Other Topics Concern   Not on file  Social History Narrative   Lives at home with her husband.   Right-handed.   1-2 cups coffee daily, occasional tea or soda.   Social Determinants  of Health   Financial Resource Strain: Low Risk  (02/20/2022)   Overall Financial Resource Strain (CARDIA)    Difficulty of Paying Living Expenses: Not hard at all  Food Insecurity: No Food Insecurity (02/20/2022)   Hunger Vital Sign    Worried About Running Out of Food in the Last Year: Never true    Ran Out of Food in the Last Year: Never true  Transportation Needs: No Transportation Needs (02/20/2022)   PRAPARE - Hydrologist (Medical): No    Lack of Transportation (Non-Medical): No  Physical Activity: Sufficiently Active (02/20/2022)   Exercise Vital Sign    Days of Exercise per Week: 6 days    Minutes of Exercise per Session: 30 min  Stress: No Stress Concern Present (02/20/2022)   [place]    Feeling of Stress : Not at all  Social Connections: [place] (02/20/2022)   Social Connection and Isolation Panel [NHANES]    Frequency of Communication with Friends and Family: More than three times a week    Frequency of Social Gatherings with Friends and Family: More than three times a week    Attends Religious Services: More than 4 times per year    Active Member of [employer] or Organizations: Yes    Attends Music therapist: More than 4 times  per year    Marital Status: Married    Tobacco Counseling Counseling given: Not Answered   Clinical Intake:   Diabetic? No    Activities of Daily Living    02/20/2022   12:50 PM  In your present state of health, do you have any difficulty performing the following activities:  Hearing? 1  Comment Wearing  Aids  Vision? 0  Difficulty concentrating or making decisions? 0  Walking or climbing stairs? 1  Comment Wears leg braces  Doing errands, shopping? 1  Comment Husband assist  Preparing Food and eating ? N  Comment Husband assist  Using the Toilet? N  In the past six months, have you accidently leaked urine? Y  Comment Wears breifs. Followed by Urologist  Do you have problems with loss of bowel control? N  Managing your Medications? N  Comment Husband assist  Managing your Finances? Y  Comment Husband assist  Housekeeping or managing your Housekeeping? Y  Comment Husband assist    Patient Care Team: Eulas Post, MD as PCP - General (Family Medicine) Stanford Breed Denice Bors, MD as Consulting Physician (Cardiology) Suella Broad, MD as Consulting Physician (Physical Medicine and Rehabilitation) Hennie Duos, MD as Consulting Physician (Rheumatology) Marcial Pacas, MD as Consulting Physician (Neurology) Armbruster, Carlota Raspberry, MD as Consulting Physician (Gastroenterology) Garner Nash, DO as Consulting Physician (Pulmonary Disease)  Indicate any recent Medical Services you may have received from other than [facility] providers in the past year (date may be approximate).     Assessment:   This is a routine wellness examination for Emily Cannon.  Hearing/Vision screen Hearing Screening - Comments:: No hearing difficulty Vision Screening - Comments:: Wears glasses. Followed by Coralie Keens  Dietary issues and exercise activities discussed: Exercise limited by: orthopedic condition(s) (Dx Drop foot)   Goals Addressed               This Visit's Progress      Patient stated (pt-stated)        I want to get better       Depression Screen    02/20/2022  12:48 PM 07/17/2021    3:29 PM 08/23/2019   10:49 AM 01/06/2019    2:16 PM 04/08/2018   10:07 AM 11/19/2017    2:01 PM 08/08/2016    2:40 PM  PHQ 2/9 Scores  PHQ - 2 Score 0 0 0 0 0 0 0    Fall Risk    02/20/2022   12:58 PM 10/29/2021    3:33 PM 08/23/2019   10:48 AM 01/06/2019    2:15 PM 04/08/2018   10:07 AM  Fall Risk   Falls in the past year? 0 0 0 0 No  Number falls in past yr: 0 0     Injury with Fall? 0 0     Risk for fall due to : No Fall Risks No Fall Risks Medication side effect;Impaired mobility    Follow up  Falls evaluation completed Falls evaluation completed;Education provided;Falls prevention discussed      FALL RISK PREVENTION PERTAINING TO THE HOME:  Any stairs in or around the home? No  If so, are there any without handrails? No  Home free of loose throw rugs in walkways, pet beds, electrical cords, etc? Yes  Adequate lighting in your home to reduce risk of falls? Yes   ASSISTIVE DEVICES UTILIZED TO PREVENT FALLS:  Life alert? No  Use of a cane, walker or w/c? Yes  Grab bars in the bathroom? No  Shower chair or bench in shower? Yes  Elevated toilet seat or a handicapped toilet? No   TIMED UP AND GO:  Was the test performed? No . Audio Visit   Cognitive Function:    04/08/2018   10:49 AM 04/08/2018   10:12 AM  MMSE - Mini Mental State Exam  Not completed:  Refused  Orientation to time 5   Orientation to Place 5   Registration 3   Attention/ Calculation 2   Recall 3   Language- name 2 objects 2   Language- repeat 1   Language- follow 3 step command 3   Language- read & follow direction 1   Write a sentence 1   Copy design 0   Total score 26         02/20/2022    1:00 PM 08/23/2019   10:52 AM  6CIT Screen  What Year? 0 points 0 points  What month? 0 points 0 points  What time? 0 points 0 points  Count back from 20 0 points 2 points  Months  in reverse 0 points 0 points  Repeat phrase 0 points 2 points  Total Score 0 points 4 points    Immunizations Immunization History  Administered Date(s) Administered   Fluad Quad(high Dose 65+) 04/14/2019, 05/17/2020   Influenza Split 05/03/2011, 05/25/2012   Influenza Whole 05/27/2006, 04/27/2008, 05/12/2009, 05/04/2010   Influenza, High Dose Seasonal PF 06/08/2015, 04/30/2016, 05/05/2017, 04/08/2018   Influenza,inj,Quad PF,6+ Mos 05/30/2014   Influenza,inj,quad, With Preservative 04/29/2019   Influenza-Unspecified 04/28/2013   Moderna Sars-Covid-2 Vaccination 08/30/2019, 09/20/2019   Pfizer Covid-19 Vaccine Bivalent Booster 19yr & up 05/15/2021   Pneumococcal Conjugate-13 11/30/2013   Pneumococcal Polysaccharide-23 04/28/2000, 08/08/2016   Td 07/27/2009   Tdap 01/21/2022   Zoster Recombinat (Shingrix) 01/23/2018, 04/23/2018   Zoster, Live 10/28/2010, 03/30/2019    TDAP status: Up to date    Pneumococcal vaccine status: Up to date  Covid-19 vaccine status: Completed vaccines  Qualifies for Shingles Vaccine? Yes   Zostavax completed Yes   Shingrix Completed?: Yes  Screening Tests Health Maintenance  Topic Date Due  URINE MICROALBUMIN  Never done   COVID-19 Vaccine (4 - Booster) 03/08/2022 (Originally 07/10/2021)   INFLUENZA VACCINE  02/26/2022   TETANUS/TDAP  01/22/2032   Pneumonia Vaccine 55+ Years old  Completed   DEXA SCAN  Completed   Zoster Vaccines- Shingrix  Completed   HPV VACCINES  Aged Out    Health Maintenance  Health Maintenance Due  Topic Date Due   URINE MICROALBUMIN  Never done    Colorectal cancer screening: No longer required.   Mammogram status: No longer required due to Age.    Lung Cancer Screening: (Low Dose CT Chest recommended if Age 1-80 years, 30 pack-year currently smoking OR have quit w/in 15years.) does not qualify.     Additional Screening:  Hepatitis C Screening:  qualify; Completed   Vision Screening: Recommended  annual ophthalmology exams for early detection of glaucoma and other disorders of the eye. Is the patient up to date with their annual eye exam?  Yes  Who is the provider or what is the name of the office in which the patient attends annual eye exams? [facility] If pt is not established with a provider, would they like to be referred to a provider to establish care? No .   Dental Screening: Recommended annual dental exams for proper oral hygiene  Community Resource Referral / Chronic Care Management:  CRR required this visit?  No   CCM required this visit?  No      Plan:     I have personally reviewed and noted the following in the patient's chart:   Medical and social history Use of alcohol, tobacco or illicit drugs  Current medications and supplements including opioid prescriptions.  Functional ability and status Nutritional status Physical activity Advanced directives List of other physicians Hospitalizations, surgeries, and ER visits in previous 12 months Vitals Screenings to include cognitive, depression, and falls Referrals and appointments  In addition, I have reviewed and discussed with patient certain preventive protocols, quality metrics, and best practice recommendations. A written personalized care plan for preventive services as well as general preventive health recommendations were provided to patient.     Criselda Peaches, LPN   [id number]/99/[id number]   Nurse Notes: None

## 2022-02-20 NOTE — Patient Instructions (Addendum)
Emily Cannon , Thank you for taking time to come for your Medicare Wellness Visit. I appreciate your ongoing commitment to your health goals. Please review the following plan we discussed and let me know if I can assist you in the future.   These are the goals we discussed:  Goals       Patient Stated      Wants to increase walking as she can. Goal is walking 150 minutes every week       Patient Stated      Patient is starting to drive again       Patient stated (pt-stated)      I want to get better        This is a list of the screening recommended for you and due dates:  Health Maintenance  Topic Date Due   Urine Protein Check  Never done   COVID-19 Vaccine (4 - Booster) 03/08/2022*   Flu Shot  02/26/2022   Tetanus Vaccine  01/22/2032   Pneumonia Vaccine  Completed   DEXA scan (bone density measurement)  Completed   Zoster (Shingles) Vaccine  Completed   HPV Vaccine  Aged Out  *Topic was postponed. The date shown is not the original due date.    Opioid Pain Medicine Management Opioids are powerful medicines that are used to treat moderate to severe pain. When used for short periods of time, they can help you to: Sleep better. Do better in physical or occupational therapy. Feel better in the first few days after an injury. Recover from surgery. Opioids should be taken with the supervision of a trained health care provider. They should be taken for the shortest period of time possible. This is because opioids can be addictive, and the longer you take opioids, the greater your risk of addiction. This addiction can also be called opioid use disorder. What are the risks? Using opioid pain medicines for longer than 3 days increases your risk of side effects. Side effects include: Constipation. Nausea and vomiting. Breathing difficulties (respiratory depression). Drowsiness. Confusion. Opioid use disorder. Itching. Taking opioid pain medicine for a long period of time can  affect your ability to do daily tasks. It also puts you at risk for: Motor vehicle crashes. Depression. Suicide. Heart attack. Overdose, which can be life-threatening. What is a pain treatment plan? A pain treatment plan is an agreement between you and your health care provider. Pain is unique to each person, and treatments vary depending on your condition. To manage your pain, you and your health care provider need to work together. To help you do this: Discuss the goals of your treatment, including how much pain you might expect to have and how you will manage the pain. Review the risks and benefits of taking opioid medicines. Remember that a good treatment plan uses more than one approach and minimizes the chance of side effects. Be honest about the amount of medicines you take and about any drug or alcohol use. Get pain medicine prescriptions from only one health care provider. Pain can be managed with many types of alternative treatments. Ask your health care provider to refer you to one or more specialists who can help you manage pain through: Physical or occupational therapy. Counseling (cognitive behavioral therapy). Good nutrition. Biofeedback. Massage. Meditation. Non-opioid medicine. Following a gentle exercise program. How to use opioid pain medicine Taking medicine Take your pain medicine exactly as told by your health care provider. Take it only when you need it. If  your pain gets less severe, you may take less than your prescribed dose if your health care provider approves. If you are not having pain, do nottake pain medicine unless your health care provider tells you to take it. If your pain is severe, do nottry to treat it yourself by taking more pills than instructed on your prescription. Contact your health care provider for help. Write down the times when you take your pain medicine. It is easy to become confused while on pain medicine. Writing the time can help you  avoid overdose. Take other over-the-counter or prescription medicines only as told by your health care provider. Keeping yourself and others safe  While you are taking opioid pain medicine: Do not drive, use machinery, or power tools. Do not sign legal documents. Do not drink alcohol. Do not take sleeping pills. Do not supervise children by yourself. Do not do activities that require climbing or being in high places. Do not go to a lake, river, ocean, spa, or swimming pool. Do not share your pain medicine with anyone. Keep pain medicine in a locked cabinet or in a secure area where pets and children cannot reach it. Stopping your use of opioids If you have been taking opioid medicine for more than a few weeks, you may need to slowly decrease (taper) how much you take until you stop completely. Tapering your use of opioids can decrease your risk of symptoms of withdrawal, such as: Pain and cramping in the abdomen. Nausea. Sweating. Sleepiness. Restlessness. Uncontrollable shaking (tremors). Cravings for the medicine. Do not attempt to taper your use of opioids on your own. Talk with your health care provider about how to do this. Your health care provider may prescribe a step-down schedule based on how much medicine you are taking and how long you have been taking it. Getting rid of leftover pills Do not save any leftover pills. Get rid of leftover pills safely by: Taking the medicine to a prescription take-back program. This is usually offered by the county or law enforcement. Bringing them to a pharmacy that has a drug disposal container. Flushing them down the toilet. Check the label or package insert of your medicine to see whether this is safe to do. Throwing them out in the trash. Check the label or package insert of your medicine to see whether this is safe to do. If it is safe to throw it out, remove the medicine from the original container, put it into a sealable bag or  container, and mix it with used coffee grounds, food scraps, dirt, or cat litter before putting it in the trash. Follow these instructions at home: Activity Do exercises as told by your health care provider. Avoid activities that make your pain worse. Return to your normal activities as told by your health care provider. Ask your health care provider what activities are safe for you. General instructions You may need to take these actions to prevent or treat constipation: Drink enough fluid to keep your urine pale yellow. Take over-the-counter or prescription medicines. Eat foods that are high in fiber, such as beans, whole grains, and fresh fruits and vegetables. Limit foods that are high in fat and processed sugars, such as fried or sweet foods. Keep all follow-up visits. This is important. Where to find support If you have been taking opioids for a long time, you may benefit from receiving support for quitting from a local support group or counselor. Ask your health care provider for a referral to  these resources in your area. Where to find more information Centers for Disease Control and Prevention (CDC): http://www.wolf.info/ [place] Food and Drug Administration (FDA): [place] Get help right away if: You may have taken too much of an opioid (overdosed). Common symptoms of an overdose: Your breathing is slower or more shallow than normal. You have a very slow heartbeat (pulse). You have slurred speech. You have nausea and vomiting. Your pupils become very small. You have other potential symptoms: You are very confused. You faint or feel like you will faint. You have cold, clammy skin. You have blue lips or fingernails. You have thoughts of harming yourself or harming others. These symptoms may represent a serious problem that is an emergency. Do not wait to see if the symptoms will go away. Get medical help right away. Call your local emergency services (911 in the [place] Do not drive  yourself to the hospital.  If you ever feel like you may hurt yourself or others, or have thoughts about taking your own life, get help right away. Go to your nearest emergency department or: Call your local emergency services (911 in the [place] Call the [facility] 402-549-4654 in the [place] Call a suicide crisis helpline, such as the [facility] at 210-411-3637 or 988 in the [place]. This is open 24 hours a day in the [place] Text the Crisis Text Line at 813-473-8788 (in the [place] Summary Opioid medicines can help you manage moderate to severe pain for a short period of time. A pain treatment plan is an agreement between you and your health care provider. Discuss the goals of your treatment, including how much pain you might expect to have and how you will manage the pain. If you think that you or someone else may have taken too much of an opioid, get medical help right away. This information is not intended to replace advice given to you by your health care provider. Make sure you discuss any questions you have with your health care provider. Document Revised: 02/07/2021 Document Reviewed: 10/25/2020 Elsevier Patient Education  [place] directives: Yes  Conditions/risks identified: None  Next appointment: Follow up in one year for your annual wellness visit     Preventive Care 65 Years and Older, Female Preventive care refers to lifestyle choices and visits with your health care provider that can promote health and wellness. What does preventive care include? A yearly physical exam. This is also called an annual well check. Dental exams once or twice a year. Routine eye exams. Ask your health care provider how often you should have your eyes checked. Personal lifestyle choices, including: Daily care of your teeth and gums. Regular physical activity. Eating a healthy diet. Avoiding tobacco and drug use. Limiting alcohol  use. Practicing safe sex. Taking low-dose aspirin every day. Taking vitamin and mineral supplements as recommended by your health care provider. What happens during an annual well check? The services and screenings done by your health care provider during your annual well check will depend on your age, overall health, lifestyle risk factors, and family history of disease. Counseling  Your health care provider may ask you questions about your: Alcohol use. Tobacco use. Drug use. Emotional well-being. Home and relationship well-being. Sexual activity. Eating habits. History of falls. Memory and ability to understand (cognition). Work and work Statistician. Reproductive health. Screening  You may have the following tests or measurements: Height, weight, and BMI. Blood pressure. Lipid and cholesterol levels.  These may be checked every 5 years, or more frequently if you are over 87 years old. Skin check. Lung cancer screening. You may have this screening every year starting at age 10 if you have a 30-pack-year history of smoking and currently smoke or have quit within the past 15 years. Fecal occult blood test (FOBT) of the stool. You may have this test every year starting at age 39. Flexible sigmoidoscopy or colonoscopy. You may have a sigmoidoscopy every 5 years or a colonoscopy every 10 years starting at age 22. Hepatitis C blood test. Hepatitis B blood test. Sexually transmitted disease (STD) testing. Diabetes screening. This is done by checking your blood sugar (glucose) after you have not eaten for a while (fasting). You may have this done every 1-3 years. Bone density scan. This is done to screen for osteoporosis. You may have this done starting at age 18. Mammogram. This may be done every 1-2 years. Talk to your health care provider about how often you should have regular mammograms. Talk with your health care provider about your test results, treatment options, and if necessary,  the need for more tests. Vaccines  Your health care provider may recommend certain vaccines, such as: Influenza vaccine. This is recommended every year. Tetanus, diphtheria, and acellular pertussis (Tdap, Td) vaccine. You may need a Td booster every 10 years. Zoster vaccine. You may need this after age 12. Pneumococcal 13-valent conjugate (PCV13) vaccine. One dose is recommended after age 67. Pneumococcal polysaccharide (PPSV23) vaccine. One dose is recommended after age 72. Talk to your health care provider about which screenings and vaccines you need and how often you need them. This information is not intended to replace advice given to you by your health care provider. Make sure you discuss any questions you have with your health care provider. Document Released: 08/11/2015 Document Revised: 04/03/2016 Document Reviewed: 05/16/2015 Elsevier Interactive Patient Education  2017 [place] Prevention in the Home Falls can cause injuries. They can happen to people of all ages. There are many things you can do to make your home safe and to help prevent falls. What can I do on the outside of my home? Regularly fix the edges of walkways and driveways and fix any cracks. Remove anything that might make you trip as you walk through a door, such as a raised step or threshold. Trim any bushes or trees on the path to your home. Use bright outdoor lighting. Clear any walking paths of anything that might make someone trip, such as rocks or tools. Regularly check to see if handrails are loose or broken. Make sure that both sides of any steps have handrails. Any raised decks and porches should have guardrails on the edges. Have any leaves, snow, or ice cleared regularly. Use sand or salt on walking paths during winter. Clean up any spills in your garage right away. This includes oil or grease spills. What can I do in the bathroom? Use night lights. Install grab bars by the toilet and in the  tub and shower. Do not use towel bars as grab bars. Use non-skid mats or decals in the tub or shower. If you need to sit down in the shower, use a plastic, non-slip stool. Keep the floor dry. Clean up any water that spills on the floor as soon as it happens. Remove soap buildup in the tub or shower regularly. Attach bath mats securely with double-sided non-slip rug tape. Do not have throw rugs and other things on  the floor that can make you trip. What can I do in the bedroom? Use night lights. Make sure that you have a light by your bed that is easy to reach. Do not use any sheets or blankets that are too big for your bed. They should not hang down onto the floor. Have a firm chair that has side arms. You can use this for support while you get dressed. Do not have throw rugs and other things on the floor that can make you trip. What can I do in the kitchen? Clean up any spills right away. Avoid walking on wet floors. Keep items that you use a lot in easy-to-reach places. If you need to reach something above you, use a strong step stool that has a grab bar. Keep electrical cords out of the way. Do not use floor polish or wax that makes floors slippery. If you must use wax, use non-skid floor wax. Do not have throw rugs and other things on the floor that can make you trip. What can I do with my stairs? Do not leave any items on the stairs. Make sure that there are handrails on both sides of the stairs and use them. Fix handrails that are broken or loose. Make sure that handrails are as long as the stairways. Check any carpeting to make sure that it is firmly attached to the stairs. Fix any carpet that is loose or worn. Avoid having throw rugs at the top or bottom of the stairs. If you do have throw rugs, attach them to the floor with carpet tape. Make sure that you have a light switch at the top of the stairs and the bottom of the stairs. If you do not have them, ask someone to add them for  you. What else can I do to help prevent falls? Wear shoes that: Do not have high heels. Have rubber bottoms. Are comfortable and fit you well. Are closed at the toe. Do not wear sandals. If you use a stepladder: Make sure that it is fully opened. Do not climb a closed stepladder. Make sure that both sides of the stepladder are locked into place. Ask someone to hold it for you, if possible. Clearly mark and make sure that you can see: Any grab bars or handrails. First and last steps. Where the edge of each step is. Use tools that help you move around (mobility aids) if they are needed. These include: Canes. Walkers. Scooters. Crutches. Turn on the lights when you go into a dark area. Replace any light bulbs as soon as they burn out. Set up your furniture so you have a clear path. Avoid moving your furniture around. If any of your floors are uneven, fix them. If there are any pets around you, be aware of where they are. Review your medicines with your doctor. Some medicines can make you feel dizzy. This can increase your chance of falling. Ask your doctor what other things that you can do to help prevent falls. This information is not intended to replace advice given to you by your health care provider. Make sure you discuss any questions you have with your health care provider. Document Released: 05/11/2009 Document Revised: 12/21/2015 Document Reviewed: 08/19/2014 Elsevier Interactive Patient Education  2017 [employer].

## 2022-03-05 ENCOUNTER — Encounter (HOSPITAL_COMMUNITY): Payer: Self-pay

## 2022-03-06 ENCOUNTER — Encounter: Payer: Self-pay | Admitting: Podiatry

## 2022-03-06 ENCOUNTER — Ambulatory Visit (INDEPENDENT_AMBULATORY_CARE_PROVIDER_SITE_OTHER): Payer: Medicare Other | Admitting: Podiatry

## 2022-03-06 DIAGNOSIS — M79674 Pain in right toe(s): Secondary | ICD-10-CM | POA: Diagnosis not present

## 2022-03-06 DIAGNOSIS — B351 Tinea unguium: Secondary | ICD-10-CM

## 2022-03-06 DIAGNOSIS — R2681 Unsteadiness on feet: Secondary | ICD-10-CM | POA: Diagnosis not present

## 2022-03-06 DIAGNOSIS — M79675 Pain in left toe(s): Secondary | ICD-10-CM | POA: Diagnosis not present

## 2022-03-07 NOTE — Progress Notes (Signed)
Subjective:   Patient ID: Emily Cannon, female   DOB: 88 y.o.   MRN: [id number]   HPI Patient presents with husband with chronic nail disease and very poor health and does have muscle disease.  Patient cannot take care of her nails they get long and sore in the corners does not smoke likes to be active if possible with bracing   Review of Systems  All other systems reviewed and are negative.       Objective:  Physical Exam Vitals and nursing note reviewed.  Constitutional:      Appearance: She is well-developed.  Pulmonary:     Effort: Pulmonary effort is normal.  Musculoskeletal:        General: Normal range of motion.  Skin:    General: Skin is warm.  Neurological:     Mental Status: She is alert.     Vascular status intact slightly weak on pulses neurological diminished range of motion adequate muscle strength diminished with patient found to have incurvated thick nailbeds 1-5 both feet that do get tender in the corners and can become painful.  Patient has good digital perfusion well-oriented     Assessment:  Chronic ingrown toenail deformity and thick yellow brittle nailbeds 1-5 both feet     Plan:  H&P reviewed continue brace therapy and usage of walker with patient having gait instability and debrided nailbeds 1-5 both feet as she cannot do this on her own with no hand strength and patient will be seen back for routine care

## 2022-04-01 ENCOUNTER — Other Ambulatory Visit: Payer: Self-pay | Admitting: Gastroenterology

## 2022-04-02 NOTE — Progress Notes (Unsigned)
No chief complaint on file.   HISTORICAL  Emily Cannon is a 88 year old female seen in request by orthopedic surgeon Dr. Suella Broad and her primary care physician Dr. Carolann Littler for evaluation of gait abnormality, bilateral feet weakness, initial evaluation was with her husband on March 28, 2020  I reviewed and summarized the referring note. 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 Amil Amen receiving IV infusion every 6 months, patient does not know the name of medication, I assumed it was rituximab,  She also had kidney involvement, was treated by kidney physician Dr. 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 at the beginning of 2021, then she had significant worsening since June 2021,  She started to have left leg numbness extending from top of left foot to lateral leg, left leg weakness, began to rely on her walker, continue to progress over the past few months, now also developed right foot numbness, weakness, even with walker, she has significant difficulty,  She also noted to have worsening memory loss, has occasionally bowel and bladder incontinence, she denies visual loss, no dysarthria, no dysphagia  UPDATE May 08 2020:  MRI of brain on Sept 27 2021 showed mild generalized atrophy, no acute abnormalities. MRI of cervical spine showed multi-level degenerative changes, C5-6, C6-7 moderate canal stenosis. MRI of thoracic spine: widespread thoracic spine degeneration,  Mild spinal stenosis, Right thyroid lobe nodule 2.3 cm.  Laboratory evaluation showed  elevated WBC 14.5, Hg 12.3, CMP creat 1.43, TSH 2.35, triglyceride 533  EMG nerve conduction study today showed evidence of length dependent severe sensorimotor polyneuropathy, there is also evidence of chronic neuropathic changes involving left cervical myotomes, T1, C8 and bilateral lumbar sacral myotomes,  UPDATE Jul 18 2020: She is accompanied by her husband for an earlier appointment for progressive worsening bilateral distal leg weakness, gait abnormalities, increased difficulty to be taking care of at home  She denies double vision, no dysarthria, swallowing difficulty, denies upper extremity weakness, denies significant lower extremity sensory loss, mild low back pain, constipation, but denies incontinence  We personally reviewed MRI of lumbar spine from [facility] in August 2021, multilevel degenerative changes, 2 3, moderate central, moderate right foraminal stenosis, most noticeable at L3-4, with mild to moderate foraminal narrowing, no significant canal stenosis,  There was also reported mild T cystic partially demonstrated changes of pancreatic head, suggest for pancreatic head neoplasm, versus benign cystic change  This led to CT of abdomen with without contrast on April 27, 2020, 2 multilobulated pancreatic cyst measuring up to 3.1 cm in pancreatic head, no high risk imaging features,  EMG nerve conduction study in October 2021, showed severe sensorimotor polyneuropathy, also chronic neuropathic changes involving bilateral lumbar sacral myotomes, and the left cervical myotomes.  She had left ankle brace in October 2021, helped her left foot drop some, but she noticed progressive right ankle weakness, increased gait abnormality.  Has appointment pending with [facility] neuromuscular specialist on August 08, 2019  UPDATE Sep 25 2020: She was seen by [facility] neurologist Dr. Orvilla Fus, MD - 08/07/2020    88 year old female with history of Wegener Granulomatosis (  hemoptysis  and impaired renal function) who has been stable on rituximab infusion every 6 months (managed by Dr. Amil Amen, rheumatologist, in [place]). The patient developed left foot drop along with tingling and painful sensation along peroneal nerve distribution in June 2021. A few months after that (around September 2021), she developed the same symptoms in her right foot. No arms involvement. Her kidney function has been stable. She received rituximab infusion every Octobrer and April and last one was in October 2021. She was referred to see a neurologist in [place] and EMG was performed in October 2021, which showed chronic bilateral lumbosacral radiculopathy, involving L4-L5, and S1 myotomes. In addition, there is evidence of length dependent sensorimotor polyneuropathy.   On my exam, she clearly has significant weakness in left > right peroneal, tibial, and sciatic (knee flexion) nerve distribution. There is sensory involvement primarily in the peroneal nerve distribution, bilaterally.   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 We will repeat her EMG study to determine severity and location of nerve biopsy. We will send labs to evaluate her kidney function, ANCA, inflammatory markers. She will need a nerve biopsy which will be determined after the EMG study. I will start her on prednisone 1 mg/kg (50 mg daily) for vasculitis treatment after the EMG study is done.   The patient had her EMG study done on 08/09/2020 which was consistent with mononeuritis multiplex. There is ongoing denervation which suggestive of active inflammation. The patient underwent a right sural nerve biopsy on 08/11/2020, findings consistent with peripheral neuropathy, no evidence of vasculitis or inflammation, negative for amyloid, Congo red stain is negative,  She was started on  prednisone 50 mg since January 2022, on tapering dose, she denies significant improvement, was also seen by [facility] rheumatologist Dr. Festus Holts  1.Consider rituximab treatment failure, change to cyclophosphamide. Given her age, frailty (though in overall great shape for her age), and prior infectious complications earlier on during remission induction therapy, this is less ideal.  2. Re-induce with rituximab at a higher dose than the 500 mg maintenance regimen she was on, either 1000 mg day 0 and 15 RA dosing or RAVE protocol weekly dosing x 4 weeks. I would also strongly consider adding avacopan 30 mg BID as an adjunct, with hopes this will add further disease control and also allow for rapid prednisone taper.   Receiving higher dose of rituximab induction, coordinated by her rheumatologist Dr. Melissa Noon office, wearing bilateral AFO, which has greatly improved her gait, nowcambulate with a walker, but there was no significant improvement in her lower extremity paresthesia, or weakness, she denies significant neuropathic pain  UPDATE Sept 6 2022: She is accompanied by her husband at today's visit, she is under the care of rheumatologist Dr. Baxter Flattery, receiving rituximab IV infusion, over the past few months also started Avacopan, tapering dose of prednisone for her microscopic angitis  She continues to have significant distal weakness, wearing bilateral AFO, significant improvement with physical therapy  Update April 03, 2022 SS:   REVIEW OF SYSTEMS: Full 14 system review of systems performed and notable only for as above All other review of systems were negative.  ALLERGIES: Allergies  Allergen Reactions   Alendronate Sodium Shortness Of Breath   Amoxicillin Diarrhea    Severe diarrhea   Codeine Nausea Only   Valsartan Swelling    Gums/throat swell    HOME MEDICATIONS: Current Outpatient Medications  Medication  Sig Dispense Refill   amLODipine (NORVASC) 10 MG tablet TAKE  ONE TABLET BY MOUTH DAILY (Patient not taking: Reported on 02/20/2022) 90 tablet 3   amLODipine (NORVASC) 5 MG tablet Take 5 mg by mouth daily.     atorvastatin (LIPITOR) 20 MG tablet TAKE ONE TABLET BY MOUTH DAILY 90 tablet 3   Avacopan (TAVNEOS) 10 MG CAPS Take 3 capsules by mouth twice daily with meals.     Cholecalciferol 25 MCG (1000 UT) tablet Take 1 capsule by mouth daily.     clotrimazole-betamethasone (LOTRISONE) cream Apply 1 application topically 2 (two) times daily. 30 g 0   COVID-19 mRNA bivalent vaccine, Pfizer, (PFIZER COVID-19 VAC BIVALENT) injection Inject into the muscle. 0.3 mL 0   esomeprazole (NEXIUM) 40 MG capsule TAKE ONE CAPSULE BY MOUTH DAILY 90 capsule 2   furosemide (LASIX) 40 MG tablet Take 0.5 tablets (20 mg total) by mouth 3 (three) times a week. Monday, Wed, Friday (Patient taking differently: Take 40 mg by mouth every Monday, Wednesday, and Friday.) 30 tablet    gabapentin (NEURONTIN) 100 MG capsule Take 2 capsules by mouth at night for neuropathy pain 180 capsule 3   levothyroxine (SYNTHROID) 50 MCG tablet TAKE ONE TABLET BY MOUTH EVERY MORNING BEFORE BREAKFAST 90 tablet 1   metoprolol succinate (TOPROL-XL) 50 MG 24 hr tablet TAKE ONE TABLET BY MOUTH TWICE A DAY WITH A MEAL OR IMMEDIATELY FOLLOWING A MEAL 180 tablet 0   Multiple Vitamins-Minerals (CENTRUM SILVER 50+WOMEN) TABS Take 1 tablet by mouth daily with breakfast.     nitrofurantoin, macrocrystal-monohydrate, (MACROBID) 100 MG capsule Take 1 capsule (100 mg total) by mouth 2 (two) times daily for 5 days 10 capsule 0   traMADol (ULTRAM) 50 MG tablet TAKE ONE TABLET BY MOUTH EVERY 6 HOURS AS NEEDED FOR UP TO 5 DAYS 30 tablet 0   triamcinolone cream (KENALOG) 0.1 % Apply 1 application. topically 2 (two) times daily. Compound in 1:1 ratio with Eucerin and apply to affected rash twice daily as  needed. 454 g 1   No current facility-administered medications for this visit.    PAST MEDICAL HISTORY: Past Medical  History:  Diagnosis Date   Allergic rhinitis, cause unspecified    Allergy    Anxiety state, unspecified    Blood transfusion without reported diagnosis    2020   Cataract    removed both eyes    Chronic kidney disease    Disorder of bone and cartilage, unspecified    Esophageal candidiasis (HCC)    Foot drop    Gastric ulcer    History of CMV    History of Helicobacter pylori infection    Hypothyroid    Irritable bowel syndrome    Lumbago    Microscopic polyangiitis (HCC)    Other and unspecified hyperlipidemia    Pancreatic cyst    Perforation of tympanic membrane, unspecified    Unspecified essential hypertension     PAST SURGICAL HISTORY: Past Surgical History:  Procedure Laterality Date   ABDOMINAL HYSTERECTOMY     BIOPSY  01/02/2019   Procedure: BIOPSY;  Surgeon: Yetta Flock, MD;  Location: [place];  Service: Gastroenterology;;   cataract surgery  5/12 and 12/24/2012   both eyes   COLONOSCOPY     COLONOSCOPY WITH PROPOFOL N/A 01/02/2019   Procedure: COLONOSCOPY WITH PROPOFOL;  Surgeon: Yetta Flock, MD;  Location: [place];  Service: Gastroenterology;  Laterality: N/A;   ESOPHAGOGASTRODUODENOSCOPY (EGD) WITH PROPOFOL N/A 01/02/2019  Procedure: ESOPHAGOGASTRODUODENOSCOPY (EGD) WITH PROPOFOL;  Surgeon: Yetta Flock, MD;  Location: [place];  Service: Gastroenterology;  Laterality: N/A;   NSVD     x1   POLYPECTOMY  01/02/2019   Procedure: POLYPECTOMY;  Surgeon: Yetta Flock, MD;  Location: [facility] ENDOSCOPY;  Service: Gastroenterology;;   POLYPECTOMY     TONSILLECTOMY     UPPER GASTROINTESTINAL ENDOSCOPY      FAMILY HISTORY: Family History  Problem Relation Age of Onset   Lung cancer Mother 31   Lung disease Father 47   COPD Other        sibling   Colon cancer Neg Hx    Colon polyps Neg Hx    Esophageal cancer Neg Hx    Rectal cancer Neg Hx    Stomach cancer Neg Hx     SOCIAL HISTORY: Social History   Socioeconomic  History   Marital status: Married    Spouse name: Jeneen Rinks   Number of children: 2   Years of education: 12   Highest education level: High school graduate  Occupational History   Occupation: retired Research scientist (physical sciences)  Tobacco Use   Smoking status: Never   Smokeless tobacco: Never  Vaping Use   Vaping Use: Never used  Substance and Sexual Activity   Alcohol use: No   Drug use: No   Sexual activity: Not Currently  Other Topics Concern   Not on file  Social History Narrative   Lives at home with her husband.   Right-handed.   1-2 cups coffee daily, occasional tea or soda.   Social Determinants of Health   Financial Resource Strain: Low Risk  (02/20/2022)   Overall Financial Resource Strain (CARDIA)    Difficulty of Paying Living Expenses: Not hard at all  Food Insecurity: No Food Insecurity (02/20/2022)   Hunger Vital Sign    Worried About Running Out of Food in the Last Year: Never true    Ran Out of Food in the Last Year: Never true  Transportation Needs: No Transportation Needs (02/20/2022)   PRAPARE - Hydrologist (Medical): No    Lack of Transportation (Non-Medical): No  Physical Activity: Sufficiently Active (02/20/2022)   Exercise Vital Sign    Days of Exercise per Week: 6 days    Minutes of Exercise per Session: 30 min  Stress: No Stress Concern Present (02/20/2022)   [place]    Feeling of Stress : Not at all  Social Connections: [place] (02/20/2022)   Social Connection and Isolation Panel [NHANES]    Frequency of Communication with Friends and Family: More than three times a week    Frequency of Social Gatherings with Friends and Family: More than three times a week    Attends Religious Services: More than 4 times per year    Active Member of [employer] or Organizations: Yes    Attends Music therapist: More than 4 times per year    Marital Status: Married   Human resources officer Violence: Not At Risk (02/20/2022)   Humiliation, Afraid, Rape, and Kick questionnaire    Fear of Current or Ex-Partner: No    Emotionally Abused: No    Physically Abused: No    Sexually Abused: No     PHYSICAL EXAM  PHYSICAL EXAMNIATION:  Gen: NAD, conversant, well nourised, well groomed               NEUROLOGICAL EXAM:  MENTAL STATUS: Speech/cognition Awake,  alert, oriented to history taking care of conversation   CRANIAL NERVES: CN II: Visual fields are full to confrontation. Pupils are round equal and briskly reactive to light. CN III, IV, VI: extraocular movement are normal. No ptosis. CN V: Facial sensation is intact to light touch CN VII: Face is symmetric with normal eye closure  CN VIII: Hearing is normal to causal conversation. CN IX, X: Phonation is normal. CN XI: Head turning and shoulder shrug are intact  MOTOR: Upper extremity motor strength is normal  LE Hip Flexion Knee flexion Knee extension Ankle Dorsiflexion Eversion Ankle plantar Flexion Inversion  R '5 5 5 1 1 4 3  '$ L '5 5 5 1 1 4 3     '$ REFLEXES: Reflexes are 2 and symmetric at the biceps, triceps, 1/1 knees, and absent at ankles. Plantar responses are mute bilaterally  SENSORY: Decreased vibratory sensation in toes, mildly length dependent decreased pinprick to mid shin level  COORDINATION: There is no trunk or limb dysmetria noted.  GAIT/STANCE: Need assistant to get up from seated position, bilateral foot drop, can ambulate with assistance of walker wearing bilateral AFO  DIAGNOSTIC DATA (LABS, IMAGING, TESTING) - I reviewed patient records, labs, notes, testing and imaging myself where available.   ASSESSMENT AND PLAN  Sharnetta ELLAYNA HILLIGOSS is a 88 y.o. 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  Left more than lower extremity distal leg  weakness, no significant neuropathic pain  Left sural nerve biopsy at [facility] in January 2022 showed findings consistent with peripheral neuropathy, no evidence of vasculitis or inflammation, negative for amyloid, Congo red stain is negative,   MRI of neural axis failed to demonstrate etiology.  Laboratory evaluations showed no treatable etiology  CT of the chest in December 2021 showed 2 new small solid pulmonary nodule, largest 0.5 cm in the right lower lobe, will have a repeat CT chest in 12 months  Referral to physical therapy  Treated by rheumatologist Dr. Baxter Flattery, receiving avacopan, tapering dose of prednisone, rituxan infusion.     Evangeline Dakin, DNP  [facility] [address], [place], [place] [address] [phone]

## 2022-04-03 ENCOUNTER — Ambulatory Visit (INDEPENDENT_AMBULATORY_CARE_PROVIDER_SITE_OTHER): Payer: Medicare Other | Admitting: Neurology

## 2022-04-03 ENCOUNTER — Encounter: Payer: Self-pay | Admitting: Neurology

## 2022-04-03 VITALS — BP 139/84 | HR 97 | Ht 60.0 in | Wt 133.0 lb

## 2022-04-03 DIAGNOSIS — G63 Polyneuropathy in diseases classified elsewhere: Secondary | ICD-10-CM

## 2022-04-03 DIAGNOSIS — R269 Unspecified abnormalities of gait and mobility: Secondary | ICD-10-CM

## 2022-04-03 DIAGNOSIS — M359 Systemic involvement of connective tissue, unspecified: Secondary | ICD-10-CM | POA: Diagnosis not present

## 2022-04-03 DIAGNOSIS — R202 Paresthesia of skin: Secondary | ICD-10-CM

## 2022-04-03 NOTE — Progress Notes (Signed)
Chart reviewed, agree above plan ?

## 2022-04-13 ENCOUNTER — Other Ambulatory Visit: Payer: Self-pay | Admitting: Family Medicine

## 2022-04-13 DIAGNOSIS — I10 Essential (primary) hypertension: Secondary | ICD-10-CM

## 2022-05-08 ENCOUNTER — Ambulatory Visit (INDEPENDENT_AMBULATORY_CARE_PROVIDER_SITE_OTHER): Payer: Medicare Other | Admitting: Podiatry

## 2022-05-08 ENCOUNTER — Encounter: Payer: Self-pay | Admitting: Podiatry

## 2022-05-08 DIAGNOSIS — M79674 Pain in right toe(s): Secondary | ICD-10-CM

## 2022-05-08 DIAGNOSIS — M79675 Pain in left toe(s): Secondary | ICD-10-CM | POA: Diagnosis not present

## 2022-05-08 DIAGNOSIS — B351 Tinea unguium: Secondary | ICD-10-CM | POA: Diagnosis not present

## 2022-05-09 NOTE — Progress Notes (Signed)
Subjective:   Patient ID: Emily Cannon, female   DOB: 88 y.o.   MRN: [id number]   HPI Patient presents with caregiver with incurvated painful nailbeds 1-5 both feet that are possible for them to cut and become painful   ROS      Objective:  Physical Exam  Vascular status intact thick yellow brittle nailbeds 1-5 both feet thickened incurvated in the corners and painful     Assessment:  Chronic mycotic nail infection 1-5 both feet symptomatic     Plan:  Debridement painful nailbeds 1-5 both feet no angiogenic bleeding reappoint routine care

## 2022-05-22 ENCOUNTER — Encounter: Payer: Self-pay | Admitting: Family Medicine

## 2022-05-22 ENCOUNTER — Ambulatory Visit (INDEPENDENT_AMBULATORY_CARE_PROVIDER_SITE_OTHER): Payer: Medicare Other | Admitting: Family Medicine

## 2022-05-22 VITALS — BP 120/62 | HR 100 | Temp 97.6°F | Wt 134.2 lb

## 2022-05-22 DIAGNOSIS — E039 Hypothyroidism, unspecified: Secondary | ICD-10-CM | POA: Diagnosis not present

## 2022-05-22 DIAGNOSIS — E785 Hyperlipidemia, unspecified: Secondary | ICD-10-CM | POA: Diagnosis not present

## 2022-05-22 DIAGNOSIS — I10 Essential (primary) hypertension: Secondary | ICD-10-CM

## 2022-05-22 DIAGNOSIS — R4189 Other symptoms and signs involving cognitive functions and awareness: Secondary | ICD-10-CM

## 2022-05-22 DIAGNOSIS — I7782 Antineutrophilic cytoplasmic antibody (ANCA) vasculitis: Secondary | ICD-10-CM | POA: Diagnosis not present

## 2022-05-22 LAB — COMPREHENSIVE METABOLIC PANEL
ALT: 18 U/L (ref 0–35)
AST: 19 U/L (ref 0–37)
Albumin: 4.4 g/dL (ref 3.5–5.2)
Alkaline Phosphatase: 86 U/L (ref 39–117)
BUN: 15 mg/dL (ref 6–23)
CO2: 29 mEq/L (ref 19–32)
Calcium: 9.5 mg/dL (ref 8.4–10.5)
Chloride: 102 mEq/L (ref 96–112)
Creatinine, Ser: 1.17 mg/dL (ref 0.40–1.20)
GFR: 42.12 mL/min — ABNORMAL LOW (ref >60.00)
Glucose, Bld: 110 mg/dL — ABNORMAL HIGH (ref 70–99)
Potassium: 4.1 mEq/L (ref 3.5–5.1)
Sodium: 139 mEq/L (ref 135–145)
Total Bilirubin: 0.4 mg/dL (ref 0.2–1.2)
Total Protein: 6.2 g/dL (ref 6.0–8.3)

## 2022-05-22 LAB — LIPID PANEL
Cholesterol: 225 mg/dL — ABNORMAL HIGH (ref 0–200)
HDL: 37.6 mg/dL — ABNORMAL LOW (ref >39.00)
Total CHOL/HDL Ratio: 6
Triglycerides: 974 mg/dL — ABNORMAL HIGH (ref 0.0–149.0)

## 2022-05-22 LAB — CBC WITH DIFFERENTIAL/PLATELET
Basophils Absolute: 0.1 10*3/uL (ref 0.0–0.1)
Basophils Relative: 1.3 % (ref 0.0–3.0)
Eosinophils Absolute: 0.2 10*3/uL (ref 0.0–0.7)
Eosinophils Relative: 2.8 % (ref 0.0–5.0)
HCT: 41.9 % (ref 36.0–46.0)
Hemoglobin: 14.4 g/dL (ref 12.0–15.0)
Lymphocytes Relative: 50.8 % — ABNORMAL HIGH (ref 12.0–46.0)
Lymphs Abs: 3.3 10*3/uL (ref 0.7–4.0)
MCHC: 34.2 g/dL (ref 30.0–36.0)
MCV: 89.5 fl (ref 78.0–100.0)
Monocytes Absolute: 1.1 10*3/uL — ABNORMAL HIGH (ref 0.1–1.0)
Monocytes Relative: 16.4 % — ABNORMAL HIGH (ref 3.0–12.0)
Neutro Abs: 1.9 10*3/uL (ref 1.4–7.7)
Neutrophils Relative %: 28.7 % — ABNORMAL LOW (ref 43.0–77.0)
Platelets: 209 10*3/uL (ref 150.0–400.0)
RBC: 4.68 Mil/uL (ref 3.87–5.11)
RDW: 14.5 % (ref 11.5–15.5)
WBC: 6.5 10*3/uL (ref 4.0–10.5)

## 2022-05-22 LAB — C-REACTIVE PROTEIN: CRP: <1 mg/dL (ref 0.5–20.0)

## 2022-05-22 LAB — SEDIMENTATION RATE: Sed Rate: 3 mm/hr (ref 0–30)

## 2022-05-22 LAB — TSH: TSH: 2.32 u[IU]/mL (ref 0.35–5.50)

## 2022-05-22 NOTE — Progress Notes (Unsigned)
Established Patient Office Visit  Subjective   Patient ID: Emily Cannon, female    DOB: 01-12-1933  Age: 88 y.o. MRN: [id number]  Chief Complaint  Patient presents with   Follow-up    HPI  {History (Optional):23778} Emily Cannon is seen for medical follow-up.  We had recently received request from her rheumatologist to get multiple labs including CBC, CMP, CRP, sed rate, urinalysis with microscopy, and urine microalbumin creatinine ratio.  She is doing well apparently with regard to her chronic vasculitis.  Was diagnosed with microscopic polyangiitis years ago.  She had significant renal impairment initially but her renal function has stabilized.  She does remain on Tavneos 10 mg 3 capsules twice daily with meals.  She has hypothyroidism and is on replacement needs follow-up TSH soon.  She has hyperlipidemia and takes atorvastatin 20 mg daily.  Ambulates with a walker.  No recent falls.  Past Medical History:  Diagnosis Date   Allergic rhinitis, cause unspecified    Allergy    Anxiety state, unspecified    Blood transfusion without reported diagnosis    2020   Cataract    removed both eyes    Chronic kidney disease    Disorder of bone and cartilage, unspecified    Esophageal candidiasis (HCC)    Foot drop    Gastric ulcer    History of CMV    History of Helicobacter pylori infection    Hypothyroid    Irritable bowel syndrome    Lumbago    Microscopic polyangiitis (HCC)    Other and unspecified hyperlipidemia    Pancreatic cyst    Perforation of tympanic membrane, unspecified    Unspecified essential hypertension    Past Surgical History:  Procedure Laterality Date   ABDOMINAL HYSTERECTOMY     BIOPSY  01/02/2019   Procedure: BIOPSY;  Surgeon: Yetta Flock, MD;  Location: [place];  Service: Gastroenterology;;   cataract surgery  5/12 and 12/24/2012   both eyes   COLONOSCOPY     COLONOSCOPY WITH PROPOFOL N/A 01/02/2019   Procedure: COLONOSCOPY WITH PROPOFOL;   Surgeon: Yetta Flock, MD;  Location: [place];  Service: Gastroenterology;  Laterality: N/A;   ESOPHAGOGASTRODUODENOSCOPY (EGD) WITH PROPOFOL N/A 01/02/2019   Procedure: ESOPHAGOGASTRODUODENOSCOPY (EGD) WITH PROPOFOL;  Surgeon: Yetta Flock, MD;  Location: [place];  Service: Gastroenterology;  Laterality: N/A;   NSVD     x1   POLYPECTOMY  01/02/2019   Procedure: POLYPECTOMY;  Surgeon: Yetta Flock, MD;  Location: [facility] ENDOSCOPY;  Service: Gastroenterology;;   POLYPECTOMY     TONSILLECTOMY     UPPER GASTROINTESTINAL ENDOSCOPY      reports that she has never smoked. She has never used smokeless tobacco. She reports that she does not drink alcohol and does not use drugs. family history includes COPD in an other family member; Lung cancer (age of onset: 75) in her mother; Lung disease (age of onset: 38) in her father. Allergies  Allergen Reactions   Alendronate Sodium Shortness Of Breath   Amoxicillin Diarrhea    Severe diarrhea   Codeine Nausea Only   Valsartan Swelling    Gums/throat swell    Review of Systems  Constitutional:  Negative for malaise/fatigue.  Eyes:  Negative for blurred vision.  Respiratory:  Negative for shortness of breath.   Cardiovascular:  Negative for chest pain.  Gastrointestinal:  Negative for abdominal pain.  Neurological:  Negative for dizziness, weakness and headaches.      Objective:  BP 120/62 (BP Location: Left Arm, Patient Position: Sitting, Cuff Size: Normal)   Pulse 100   Temp 97.6 F (36.4 C) (Oral)   Wt 134 lb 3.2 oz (60.9 kg)   SpO2 96%   BMI 26.21 kg/m  BP Readings from Last 3 Encounters:  05/22/22 120/62  04/03/22 139/84  10/29/21 132/60   Wt Readings from Last 3 Encounters:  05/22/22 134 lb 3.2 oz (60.9 kg)  04/03/22 133 lb (60.3 kg)  02/20/22 126 lb (57.2 kg)      Physical Exam Vitals reviewed.  Constitutional:      Appearance: Normal appearance.  Cardiovascular:     Rate and Rhythm:  Regular rhythm.     Comments: Borderline tachycardia with heart rate around 100 but regular Musculoskeletal:     Comments: Only trace nonpitting edema lower legs bilaterally  Neurological:     General: No focal deficit present.     Mental Status: She is alert.      No results found for any visits on 05/22/22.  {Labs (Optional):23779}  The ASCVD Risk score (Arnett DK, et al., 2019) failed to calculate for the following reasons:   The 2019 ASCVD risk score is only valid for ages 18 to 103    Assessment & Plan:   Problem List Items Addressed This Visit       Unprioritized   Hypothyroidism   Relevant Orders   TSH   Hyperlipidemia   Relevant Orders   Lipid panel   Other Visit Diagnoses     ANCA-associated vasculitis ([place])    -  Primary   Relevant Orders   CBC with Differential/Platelet   CMP   Sedimentation rate   CBC with Differential/Platelet   C-reactive Protein   Urinalysis with Reflex Microscopic   Microalbumin / creatinine urine ratio     -Multiple labs obtained as above per rheumatologist request -Check TSH with regard to her hypothyroidism and lipid panel with regard to her hyperlipidemia. -Both she and her husband have already had flu vaccine. -Consider 51-monthfollow-up  No follow-ups on file.    BCarolann Littler MD

## 2022-05-23 LAB — MICROALBUMIN / CREATININE URINE RATIO
Creatinine,U: 44.3 mg/dL
Microalb Creat Ratio: 9.2 mg/g (ref 0.0–30.0)
Microalb, Ur: 4.1 mg/dL — ABNORMAL HIGH (ref 0.0–1.9)

## 2022-05-23 LAB — URINALYSIS, ROUTINE W REFLEX MICROSCOPIC
Bilirubin Urine: NEGATIVE
Hgb urine dipstick: NEGATIVE
Ketones, ur: NEGATIVE
Leukocytes,Ua: NEGATIVE
Nitrite: NEGATIVE
RBC / HPF: NONE SEEN (ref 0–?)
Specific Gravity, Urine: 1.01 (ref 1.000–1.030)
Total Protein, Urine: NEGATIVE
Urine Glucose: NEGATIVE
Urobilinogen, UA: 0.2 (ref 0.0–1.0)
pH: 5.5 (ref 5.0–8.0)

## 2022-05-23 LAB — LDL CHOLESTEROL, DIRECT: Direct LDL: 60 mg/dL

## 2022-05-24 MED ORDER — ICOSAPENT ETHYL 1 G PO CAPS: ORAL_CAPSULE | ORAL | 5 refills | Status: DC

## 2022-05-24 NOTE — Addendum Note (Signed)
Addended by: Nilda Riggs on: 05/24/2022 09:53 AM   Modules accepted: Orders

## 2022-05-24 NOTE — Addendum Note (Signed)
Addended by: Agnes Lawrence on: 05/24/2022 01:47 PM   Modules accepted: Orders

## 2022-05-24 NOTE — Addendum Note (Signed)
Addended by: Agnes Lawrence on: 05/24/2022 01:45 PM   Modules accepted: Orders

## 2022-06-04 ENCOUNTER — Encounter (HOSPITAL_COMMUNITY): Payer: Self-pay

## 2022-06-04 ENCOUNTER — Other Ambulatory Visit (HOSPITAL_BASED_OUTPATIENT_CLINIC_OR_DEPARTMENT_OTHER): Payer: Self-pay

## 2022-06-04 MED ORDER — COVID-19 MRNA VAC-TRIS(PFIZER) 30 MCG/0.3ML IM SUSY
0.3000 mL | PREFILLED_SYRINGE | Freq: Once | INTRAMUSCULAR | 0 refills | Status: AC
  Filled 2022-06-04: qty 0.3, 1d supply, fill #0

## 2022-06-15 ENCOUNTER — Other Ambulatory Visit (HOSPITAL_BASED_OUTPATIENT_CLINIC_OR_DEPARTMENT_OTHER): Payer: Self-pay

## 2022-06-15 MED ORDER — FLUAD QUADRIVALENT 0.5 ML IM PRSY
0.5000 mL | PREFILLED_SYRINGE | Freq: Once | INTRAMUSCULAR | 0 refills | Status: AC
  Filled 2022-06-15: qty 0.5, 1d supply, fill #0

## 2022-07-10 ENCOUNTER — Ambulatory Visit: Payer: Medicare Other | Admitting: Podiatry

## 2022-07-15 ENCOUNTER — Ambulatory Visit (INDEPENDENT_AMBULATORY_CARE_PROVIDER_SITE_OTHER): Payer: Medicare Other | Admitting: Podiatry

## 2022-07-15 ENCOUNTER — Other Ambulatory Visit: Payer: Self-pay | Admitting: Family Medicine

## 2022-07-15 ENCOUNTER — Encounter: Payer: Self-pay | Admitting: Podiatry

## 2022-07-15 DIAGNOSIS — B351 Tinea unguium: Secondary | ICD-10-CM

## 2022-07-15 DIAGNOSIS — M79675 Pain in left toe(s): Secondary | ICD-10-CM | POA: Diagnosis not present

## 2022-07-15 DIAGNOSIS — M79674 Pain in right toe(s): Secondary | ICD-10-CM

## 2022-07-15 DIAGNOSIS — I10 Essential (primary) hypertension: Secondary | ICD-10-CM

## 2022-07-16 NOTE — Progress Notes (Signed)
Subjective:   Patient ID: Emily Cannon, female   DOB: 88 y.o.   MRN: [id number]   HPI Patient presents with elongated nailbeds that are thickened and they have no ability to cut with caregiver today stating that they become painful   ROS      Objective:  Physical Exam  Neuro vas scaler status intact thick yellow brittle nailbeds 1-5 both feet painful     Assessment:  Chronic mycotic nail infections 1-5 both feet with pain     Plan:  Debridement lesions 1-5 both feet no iatrogenic bleeding reappoint routine care

## 2022-07-19 ENCOUNTER — Other Ambulatory Visit: Payer: Self-pay | Admitting: Family Medicine

## 2022-08-21 ENCOUNTER — Encounter: Payer: Self-pay | Admitting: Gastroenterology

## 2022-08-30 ENCOUNTER — Other Ambulatory Visit (INDEPENDENT_AMBULATORY_CARE_PROVIDER_SITE_OTHER): Payer: Medicare Other

## 2022-08-30 DIAGNOSIS — I7782 Antineutrophilic cytoplasmic antibody (ANCA) vasculitis: Secondary | ICD-10-CM

## 2022-08-30 DIAGNOSIS — E785 Hyperlipidemia, unspecified: Secondary | ICD-10-CM

## 2022-08-30 LAB — CBC WITH DIFFERENTIAL/PLATELET
Basophils Absolute: 0.1 10*3/uL (ref 0.0–0.1)
Basophils Relative: 1.1 % (ref 0.0–3.0)
Eosinophils Absolute: 0.1 10*3/uL (ref 0.0–0.7)
Eosinophils Relative: 1.2 % (ref 0.0–5.0)
HCT: 40.8 % (ref 36.0–46.0)
Hemoglobin: 13.8 g/dL (ref 12.0–15.0)
Lymphocytes Relative: 48.4 % — ABNORMAL HIGH (ref 12.0–46.0)
Lymphs Abs: 2.1 10*3/uL (ref 0.7–4.0)
MCHC: 33.9 g/dL (ref 30.0–36.0)
MCV: 89.6 fl (ref 78.0–100.0)
Monocytes Absolute: 0.8 10*3/uL (ref 0.1–1.0)
Monocytes Relative: 18.7 % — ABNORMAL HIGH (ref 3.0–12.0)
Neutro Abs: 1.4 10*3/uL (ref 1.4–7.7)
Neutrophils Relative %: 30.6 % — ABNORMAL LOW (ref 43.0–77.0)
Platelets: 197 10*3/uL (ref 150.0–400.0)
RBC: 4.55 Mil/uL (ref 3.87–5.11)
RDW: 13.5 % (ref 11.5–15.5)
WBC: 4.4 10*3/uL (ref 4.0–10.5)

## 2022-08-30 LAB — LIPID PANEL
Cholesterol: 167 mg/dL (ref 0–200)
HDL: 32.9 mg/dL — ABNORMAL LOW (ref >39.00)
Total CHOL/HDL Ratio: 5
Triglycerides: 405 mg/dL — ABNORMAL HIGH (ref 0.0–149.0)

## 2022-08-30 LAB — LDL CHOLESTEROL, DIRECT: Direct LDL: 48 mg/dL

## 2022-09-30 ENCOUNTER — Encounter: Payer: Self-pay | Admitting: Podiatry

## 2022-09-30 ENCOUNTER — Ambulatory Visit (INDEPENDENT_AMBULATORY_CARE_PROVIDER_SITE_OTHER): Payer: Medicare Other | Admitting: Podiatry

## 2022-09-30 DIAGNOSIS — M79674 Pain in right toe(s): Secondary | ICD-10-CM | POA: Diagnosis not present

## 2022-09-30 DIAGNOSIS — M79675 Pain in left toe(s): Secondary | ICD-10-CM

## 2022-09-30 DIAGNOSIS — B351 Tinea unguium: Secondary | ICD-10-CM

## 2022-10-02 NOTE — Progress Notes (Signed)
Subjective:   Patient ID: Emily Cannon, female   DOB: 89 y.o.   MRN: [id number]   HPI Patient presents with elongated nailbeds 1-5 both feet that get thick and incurvated in the corners   ROS      Objective:  Physical Exam  Thick yellow nailbeds 1-5 both feet that her or caregiver cannot take care of the become irritated with patient having moderate dementia symptomatology     Assessment:  Chronic mycotic nail infections 1-5 both feet that get painful and she cannot take care of     Plan:  Debridement nailbeds 1-5 both feet no angiogenic bleeding reappoint routine care or earlier if needed

## 2022-10-15 ENCOUNTER — Other Ambulatory Visit: Payer: Self-pay | Admitting: Family Medicine

## 2022-10-15 DIAGNOSIS — I10 Essential (primary) hypertension: Secondary | ICD-10-CM

## 2022-10-20 ENCOUNTER — Other Ambulatory Visit: Payer: Self-pay | Admitting: Family Medicine

## 2022-11-20 ENCOUNTER — Other Ambulatory Visit: Payer: Self-pay | Admitting: Family Medicine

## 2022-11-20 ENCOUNTER — Ambulatory Visit: Payer: Medicare Other | Admitting: Family Medicine

## 2022-12-02 ENCOUNTER — Ambulatory Visit (INDEPENDENT_AMBULATORY_CARE_PROVIDER_SITE_OTHER): Payer: Medicare Other | Admitting: Podiatry

## 2022-12-02 ENCOUNTER — Encounter: Payer: Self-pay | Admitting: Podiatry

## 2022-12-02 DIAGNOSIS — B351 Tinea unguium: Secondary | ICD-10-CM | POA: Diagnosis not present

## 2022-12-02 DIAGNOSIS — M79675 Pain in left toe(s): Secondary | ICD-10-CM

## 2022-12-02 DIAGNOSIS — M79674 Pain in right toe(s): Secondary | ICD-10-CM | POA: Diagnosis not present

## 2022-12-02 NOTE — Progress Notes (Signed)
Subjective:   Patient ID: Emily Cannon, female   DOB: 89 y.o.   MRN: [id number]   HPI Patient presents with incurvation of nailbeds 1-5 both feet that do get thick and can become irritated at times   ROS      Objective:  Physical Exam  Neurovascular status intact with nailbed to get incurvated and she cannot take care of 1-5 both feet with pain     Assessment:  Mycotic nail infection with pain 1-5 both feet     Plan:  Debridement nailbeds 1-5 both feet no angiogenic bleeding reappoint routine care

## 2022-12-25 ENCOUNTER — Encounter: Payer: Self-pay | Admitting: Internal Medicine

## 2022-12-25 ENCOUNTER — Ambulatory Visit (INDEPENDENT_AMBULATORY_CARE_PROVIDER_SITE_OTHER): Payer: Medicare Other | Admitting: Internal Medicine

## 2022-12-25 ENCOUNTER — Other Ambulatory Visit: Payer: Self-pay

## 2022-12-25 VITALS — BP 112/67 | HR 59 | Temp 96.9°F

## 2022-12-25 DIAGNOSIS — R8271 Bacteriuria: Secondary | ICD-10-CM

## 2022-12-25 NOTE — Progress Notes (Signed)
[facility]      Reason for Consult: history of recurrent UTIs    Referring Physician: Dr. Malen Gauze    Patient ID: Emily Cannon, female    DOB: July 19, 1933, 89 y.o.   MRN: [id number]  HPI:   She is here for evaluation of recurrent UTIs. She has a history of microscopic polyangiitis and followed by rheumatology and on rituxan and tavneos for treatment.  She also takes Bactrim for PJP prophylaxis.  She follows dermatology for lesions on her pelvic area and to her groin as well.  She reports intermittent symptoms of burning with urination.  She does report some frequency as well.  She generally reports her symptoms to her PCP and gets started empirically on an antibiotic.  She has had several urine cultures positive for E coli and one with Enterococcus and one last year with Enterococcus.  Recent UAs with minimal findings of just 6-12 WBCs.  No associated fever.    Past Medical History:  Diagnosis Date   Allergic rhinitis, cause unspecified    Allergy    Anxiety state, unspecified    Blood transfusion without reported diagnosis    2020   Cataract    removed both eyes    Chronic kidney disease    Disorder of bone and cartilage, unspecified    Esophageal candidiasis (HCC)    Foot drop    Gastric ulcer    History of CMV    History of Helicobacter pylori infection    Hypothyroid    Irritable bowel syndrome    Lumbago    Microscopic polyangiitis (HCC)    Other and unspecified hyperlipidemia    Pancreatic cyst    Perforation of tympanic membrane, unspecified    Unspecified essential hypertension     Prior to Admission medications   Medication Sig Start Date End Date Taking? Authorizing Provider  acetaminophen (TYLENOL) 500 MG tablet Take 500 mg by mouth every 6 (six) hours as needed.   Yes [provider]  amLODipine (NORVASC) 5 MG tablet Take 5 mg by mouth daily.   Yes [provider]  atorvastatin (LIPITOR) 20 MG tablet TAKE ONE  TABLET BY MOUTH DAILY 10/21/22  Yes Burchette, Elberta Fortis, MD  Avacopan (TAVNEOS) 10 MG CAPS Take 3 capsules by mouth twice daily with meals. 09/27/20  Yes [provider]  Cholecalciferol 25 MCG (1000 UT) tablet Take 1 capsule by mouth daily.   Yes [provider]  COVID-19 mRNA bivalent vaccine, Pfizer, (PFIZER COVID-19 VAC BIVALENT) injection Inject into the muscle. 05/15/21  Yes Judyann Munson, MD  esomeprazole (NEXIUM) 40 MG capsule TAKE ONE CAPSULE BY MOUTH DAILY 04/02/22  Yes Armbruster, Willaim Rayas, MD  furosemide (LASIX) 40 MG tablet Take 0.5 tablets (20 mg total) by mouth 3 (three) times a week. Monday, Wed, Friday Patient taking differently: Take 40 mg by mouth every Monday, Wednesday, and Friday. 12/18/18  Yes Rhetta Mura, MD  gabapentin (NEURONTIN) 100 MG capsule Take 2 capsules by mouth at night for neuropathy pain Patient taking differently: 100 mg 3 (three) times daily. Take 2 capsules by mouth at night for neuropathy pain 04/05/20  Yes Burchette, Elberta Fortis, MD  levothyroxine (SYNTHROID) 50 MCG tablet TAKE ONE TABLET BY MOUTH EVERY MORNING BEFORE BREAKFAST 07/22/22  Yes Burchette, Elberta Fortis, MD  Loratadine (CLARITIN) 10 MG CAPS Take 10 mg by mouth daily.   Yes [provider]  metoprolol succinate (TOPROL-XL) 50 MG 24 hr tablet TAKE 1 TABLET  BY MOUTH TWICE A DAY WITH A MEAL OR IMMEDIATELY FOLLOWING A MEAL 10/15/22  Yes Burchette, Elberta Fortis, MD  Multiple Vitamins-Minerals (CENTRUM SILVER 50+WOMEN) TABS Take 1 tablet by mouth daily with breakfast.   Yes [provider]  sulfamethoxazole-trimethoprim (BACTRIM DS) 800-160 MG tablet Take 1 tablet by mouth 3 (three) times a week.   Yes [provider]  traMADol (ULTRAM) 50 MG tablet TAKE ONE TABLET BY MOUTH EVERY 6 HOURS AS NEEDED FOR UP TO 5 DAYS 02/13/20  Yes Burchette, Elberta Fortis, MD  triamcinolone cream (KENALOG) 0.1 % Apply 1 application. topically 2 (two) times daily. Compound in 1:1 ratio with Eucerin and  apply to affected rash twice daily as  needed. 10/29/21  Yes Burchette, Elberta Fortis, MD  VASCEPA 1 g capsule TAKE 2 CAPSULES BY MOUTH TWICE A DAY 11/21/22  Yes Burchette, Elberta Fortis, MD  silver sulfADIAZINE (SILVADENE) 1 % cream Apply 1 Application topically daily.    [provider]    Allergies  Allergen Reactions   Alendronate Sodium Shortness Of Breath   Amoxicillin Diarrhea    Severe diarrhea   Codeine Nausea Only   Valsartan Swelling    Gums/throat swell    Social History   Tobacco Use   Smoking status: Never   Smokeless tobacco: Never  Vaping Use   Vaping Use: Never used  Substance Use Topics   Alcohol use: No   Drug use: No    Family History  Problem Relation Age of Onset   Lung cancer Mother 41   Lung disease Father 23   COPD Other        sibling   Colon cancer Neg Hx    Colon polyps Neg Hx    Esophageal cancer Neg Hx    Rectal cancer Neg Hx    Stomach cancer Neg Hx     Review of Systems  Constitutional: negative for fevers and chills All other systems reviewed and are negative    Constitutional: in no apparent distress There were no vitals filed for this visit. EYES: anicteric Respiratory: normal respiratory effort Musculoskeletal: no edema  Labs: Lab Results  Component Value Date   WBC 4.4 08/30/2022   HGB 13.8 08/30/2022   HCT 40.8 08/30/2022   MCV 89.6 08/30/2022   PLT 197.0 08/30/2022    Lab Results  Component Value Date   CREATININE 1.17 05/22/2022   BUN 15 05/22/2022   NA 139 05/22/2022   K 4.1 05/22/2022   CL 102 05/22/2022   CO2 29 05/22/2022    Lab Results  Component Value Date   ALT 18 05/22/2022   AST 19 05/22/2022   ALKPHOS 86 05/22/2022   BILITOT 0.4 05/22/2022   INR 1.0 10/01/2018     Assessment: asymptomatic bacteruria.  In review of her symptoms and the UA, I am underwhelmed with the consideration of this as a urinary tract infection.  Minimal inflammation in the UA, very mild symptoms of just burning and some  frequency that occur over and over makes a different etiology more likely.  I recommend seeing how she does off of antibiotics.  I suspect she will feel no different.  Additionally, I do not feel she needs any particular reduction in immunosuppression related to the concern for recurrent UTIs specifically.   Plan: 1)  stop antibiotics and observe She will return as needed or with any new concerns or symptoms.

## 2022-12-27 ENCOUNTER — Other Ambulatory Visit: Payer: Self-pay | Admitting: Gastroenterology

## 2023-01-06 ENCOUNTER — Other Ambulatory Visit: Payer: Self-pay | Admitting: Family Medicine

## 2023-01-07 ENCOUNTER — Ambulatory Visit (INDEPENDENT_AMBULATORY_CARE_PROVIDER_SITE_OTHER): Payer: Medicare Other | Admitting: Family Medicine

## 2023-01-07 ENCOUNTER — Encounter: Payer: Self-pay | Admitting: Family Medicine

## 2023-01-07 VITALS — BP 130/60 | HR 78 | Temp 98.2°F | Ht 60.0 in | Wt 136.6 lb

## 2023-01-07 DIAGNOSIS — I10 Essential (primary) hypertension: Secondary | ICD-10-CM

## 2023-01-07 DIAGNOSIS — E7800 Pure hypercholesterolemia, unspecified: Secondary | ICD-10-CM | POA: Diagnosis not present

## 2023-01-07 DIAGNOSIS — E039 Hypothyroidism, unspecified: Secondary | ICD-10-CM

## 2023-01-07 DIAGNOSIS — M317 Microscopic polyangiitis: Secondary | ICD-10-CM | POA: Diagnosis not present

## 2023-01-07 NOTE — Progress Notes (Signed)
Established Patient Office Visit  Subjective   Patient ID: Emily Cannon, female    DOB: [birth year]  Age: 89 y.o. MRN: [id number]  Chief Complaint  Patient presents with   Medical Management of Chronic Issues    HPI   Emily Cannon is seen for medical follow-up.  She has history of hypertension, microscopic polyangiitis, bilateral foot drop, IBS, hypothyroidism, hyperlipidemia, hypertriglyceridemia with triglycerides previously up near thousand, peripheral neuropathy.  She has had recurrent rash on her left side and initially had been diagnosed with shingles.  She has seen multiple dermatologists.  She eventually ended up getting another biopsy and she had results consistent with herpes simplex virus.  She is now taking Valtrex 500 mg twice daily and was treated with topical Silvadene cream.  Rash is now resolving.  She is pleased with that.  She has hypertension currently treated with metoprolol and amlodipine.  Her blood pressures been controlled by home readings.  No dizziness.  No peripheral edema.  Hypothyroidism treated with levothyroxine 50 mcg daily.  Compliant with therapy.  She has elevated triglycerides and cholesterol and is on combination therapy with atorvastatin and Vascepa.  Would like to get follow-up lipids panel  Past Medical History:  Diagnosis Date   Allergic rhinitis, cause unspecified    Allergy    Anxiety state, unspecified    Blood transfusion without reported diagnosis    2020   Cataract    removed both eyes    Chronic kidney disease    Disorder of bone and cartilage, unspecified    Esophageal candidiasis (HCC)    Foot drop    Gastric ulcer    History of CMV    History of Helicobacter pylori infection    Hypothyroid    Irritable bowel syndrome    Lumbago    Microscopic polyangiitis (HCC)    Other and unspecified hyperlipidemia    Pancreatic cyst    Perforation of tympanic membrane, unspecified    Unspecified essential hypertension    Past Surgical  History:  Procedure Laterality Date   ABDOMINAL HYSTERECTOMY     BIOPSY  01/02/2019   Procedure: BIOPSY;  Surgeon: Benancio Deeds, MD;  Location: [facility] ENDOSCOPY;  Service: Gastroenterology;;   cataract surgery  5/12 and 12/24/2012   both eyes   COLONOSCOPY     COLONOSCOPY WITH PROPOFOL N/A 01/02/2019   Procedure: COLONOSCOPY WITH PROPOFOL;  Surgeon: Benancio Deeds, MD;  Location: [facility] ENDOSCOPY;  Service: Gastroenterology;  Laterality: N/A;   ESOPHAGOGASTRODUODENOSCOPY (EGD) WITH PROPOFOL N/A 01/02/2019   Procedure: ESOPHAGOGASTRODUODENOSCOPY (EGD) WITH PROPOFOL;  Surgeon: Benancio Deeds, MD;  Location: [facility] ENDOSCOPY;  Service: Gastroenterology;  Laterality: N/A;   NSVD     x1   POLYPECTOMY  01/02/2019   Procedure: POLYPECTOMY;  Surgeon: Benancio Deeds, MD;  Location: [facility] ENDOSCOPY;  Service: Gastroenterology;;   POLYPECTOMY     TONSILLECTOMY     UPPER GASTROINTESTINAL ENDOSCOPY      reports that she has never smoked. She has never used smokeless tobacco. She reports that she does not drink alcohol and does not use drugs. family history includes COPD in an other family member; Lung cancer (age of onset: 78) in her mother; Lung disease (age of onset: 57) in her father. Allergies  Allergen Reactions   Alendronate Sodium Shortness Of Breath   Amoxicillin Diarrhea    Severe diarrhea   Codeine Nausea Only   Valsartan Swelling    Gums/throat swell    Review of Systems  Constitutional:  Negative for malaise/fatigue.  Eyes:  Negative for blurred vision.  Respiratory:  Negative for shortness of breath.   Cardiovascular:  Negative for chest pain.  Gastrointestinal:  Negative for abdominal pain.  Neurological:  Negative for dizziness, weakness and headaches.      Objective:     BP 130/60 (BP Location: Left Arm, Patient Position: Sitting, Cuff Size: Large)   Pulse 78   Temp 98.2 F (36.8 C) (Oral)   Ht 5' (1.524 m)   Wt 136 lb 9.6 oz (62 kg)   SpO2 95%   BMI 26.68  kg/m  BP Readings from Last 3 Encounters:  01/07/23 130/60  12/25/22 112/67  05/22/22 120/62   Wt Readings from Last 3 Encounters:  01/07/23 136 lb 9.6 oz (62 kg)  05/22/22 134 lb 3.2 oz (60.9 kg)  04/03/22 133 lb (60.3 kg)      Physical Exam Vitals reviewed.  Constitutional:      Appearance: She is well-developed.  Neck:     Thyroid: No thyromegaly.     Vascular: No JVD.  Cardiovascular:     Rate and Rhythm: Normal rate and regular rhythm.     Heart sounds:     No gallop.  Pulmonary:     Effort: Pulmonary effort is normal. No respiratory distress.     Breath sounds: Normal breath sounds. No wheezing or rales.  Musculoskeletal:     Cervical back: Neck supple.     Right lower leg: No edema.     Left lower leg: No edema.  Neurological:     Mental Status: She is alert.      No results found for any visits on 01/07/23.    The ASCVD Risk score (Arnett DK, et al., 2019) failed to calculate for the following reasons:   The 2019 ASCVD risk score is only valid for ages 63 to 71    Assessment & Plan:   #1 hypertension stable and at goal.  Continue amlodipine 5 mg daily and Toprol-XL 50 mg 1 twice daily.  Continue low-sodium diet  #2 hyperlipidemia/hypertriglyceridemia.  Continue low saturated fat diet.  Schedule fasting lipid panel which she will schedule for 1 day next week.  Continue atorvastatin and Vascepa  #3 hypothyroidism treated with levothyroxine 50 mcg daily.  Recheck TSH with labs next week  #4 microscopic polyangiitis currently stable.  She had some related renal involvement and renal function has largely improved.  Continue close follow-up with rheumatology.  #5 herpes simplex virus rash left side improved on Valtrex.   Return in about 6 months (around 07/09/2023).    Evelena Peat, MD

## 2023-01-09 ENCOUNTER — Other Ambulatory Visit: Payer: Self-pay | Admitting: Family Medicine

## 2023-01-09 DIAGNOSIS — I10 Essential (primary) hypertension: Secondary | ICD-10-CM

## 2023-01-14 ENCOUNTER — Other Ambulatory Visit (INDEPENDENT_AMBULATORY_CARE_PROVIDER_SITE_OTHER): Payer: Medicare Other

## 2023-01-14 ENCOUNTER — Other Ambulatory Visit: Payer: Self-pay | Admitting: Family Medicine

## 2023-01-14 DIAGNOSIS — E7800 Pure hypercholesterolemia, unspecified: Secondary | ICD-10-CM | POA: Diagnosis not present

## 2023-01-14 DIAGNOSIS — E039 Hypothyroidism, unspecified: Secondary | ICD-10-CM

## 2023-01-14 LAB — LIPID PANEL
Cholesterol: 159 mg/dL (ref 0–200)
HDL: 37.3 mg/dL — ABNORMAL LOW (ref >39.00)
Total CHOL/HDL Ratio: 4
Triglycerides: 406 mg/dL — ABNORMAL HIGH (ref 0.0–149.0)

## 2023-01-14 LAB — LDL CHOLESTEROL, DIRECT: Direct LDL: 46 mg/dL

## 2023-01-14 LAB — TSH: TSH: 1.7 u[IU]/mL (ref 0.35–5.50)

## 2023-01-29 ENCOUNTER — Other Ambulatory Visit: Payer: Self-pay | Admitting: Gastroenterology

## 2023-02-01 ENCOUNTER — Other Ambulatory Visit: Payer: Self-pay | Admitting: Gastroenterology

## 2023-02-10 ENCOUNTER — Ambulatory Visit: Payer: Medicare Other | Admitting: Podiatry

## 2023-02-25 ENCOUNTER — Encounter: Payer: Self-pay | Admitting: Family Medicine

## 2023-02-25 ENCOUNTER — Telehealth (INDEPENDENT_AMBULATORY_CARE_PROVIDER_SITE_OTHER): Payer: Medicare Other | Admitting: Family Medicine

## 2023-02-25 VITALS — Ht 60.0 in | Wt 130.0 lb

## 2023-02-25 DIAGNOSIS — Z0000 Encounter for general adult medical examination without abnormal findings: Secondary | ICD-10-CM

## 2023-02-25 NOTE — Progress Notes (Signed)
PATIENT CHECK-IN and HEALTH RISK ASSESSMENT QUESTIONNAIRE:  -completed by phone/video for upcoming Medicare Preventive Visit  Pre-Visit Check-in: 1)Vitals (height, wt, BP, etc) - record in vitals section for visit on day of visit 2)Review and Update Medications, Allergies PMH, Surgeries, Social history in Epic 3)Hospitalizations in the last year with date/reason? no 4)Review and Update Care Team (patient's specialists) in Epic 5) Complete PHQ9 in Epic  6) Complete Fall Screening in Epic 7)Review all Health Maintenance Due and order under PCP if not done.  8)Medicare Wellness Questionnaire: Answer theses question about your habits: Do you drink alcohol? No If yes, how many drinks do you have a day?None Have you ever smoked?No Quit date if applicable? No  How many packs a day do/did you smoke? No Do you use smokeless tobacco?No Do you use an illicit drugs?No Do you exercises? yesIF so, what type and how many days/minutes per week? walking ever other day - at least 15-20 minutes, does chair exercise, balance exercises every other day Are you sexually active? No Number of partners? Typical breakfast: Dry Cereal, Yogurt Typical lunch: Light sandwich Typical dinner: various heavy to light dinner. Beef tips with rice, corn bread Typical snacks: cookies, fruit, apples, oranges  Beverages: coke, coffee, water  Answer theses question about you: Can you perform most household chores? Some - husband does Do you find it hard to follow a conversation in a noisy room? Using hearing aids Do you often ask people to speak up or repeat themselves?no Do you feel that you have a problem with memory? A little - but feels is pretty normal Do you balance your checkbook and or bank acounts? Husband does this Do you feel safe at home?yes Last dentist visit? Did not go during pandemic, going to make appt soon Do you need assistance with any of the following: Please note if so   Driving? Yes, does not  drive  Feeding yourself? no  Getting from bed to chair? no  Getting to the toilet? no  Bathing or showering? no  Dressing yourself? no  Managing money? No, but husband does  Climbing a flight of stairs no  Preparing meals? no  Do you have Advanced Directives in place (Living Will, Healthcare Power or [place])? Does have hcpoa   Last eye Exam and location? They are scheduling eye doctor   Do you currently use prescribed or non-prescribed narcotic or opioid pain medications? On tramadol for pain  Do you have a history or close family history of breast, ovarian, tubal or peritoneal cancer or a family member with BRCA (breast cancer susceptibility 1 and 2) gene mutations?     ----------------------------------------------------------------------------------------------------------------------------------------------------------------------------------------------------------------------   MEDICARE ANNUAL PREVENTIVE VISIT WITH PROVIDER: (Welcome to [employer], initial annual wellness or annual wellness exam)  Virtual Visit via Phone Note  I connected with Emily Cannon on 02/25/23 by phone and verified that I am speaking with the correct person using two identifiers.  Location patient: home Location provider:work or home office Persons participating in the virtual visit: patient, provider  Concerns and/or follow up today: none. She is going to see ID next week about recurrent UTIs.    See HM section in Epic for other details of completed HM.    ROS: negative for report of fevers, unintentional weight loss, vision changes, vision loss, chest pain, sob, hemoptysis, melena, hematochezia, hematuria, falls, bleeding or bruising, thoughts of suicide or self harm, memory loss  Patient-completed extensive health risk assessment - reviewed and discussed with the patient: See Health  Risk Assessment completed with patient prior to the visit either above or in recent phone note. This  was reviewed in detailed with the patient today and appropriate recommendations, orders and referrals were placed as needed per Summary below and patient instructions.   Review of Medical History: -PMH, PSH, Family History and current specialty and care providers reviewed and updated and listed below   Patient Care Team: Kristian Covey, MD as PCP - General (Family Medicine) Jens Som Madolyn Frieze, MD as Consulting Physician (Cardiology) Sheran Luz, MD as Consulting Physician (Physical Medicine and Rehabilitation) Donnetta Hail, MD as Consulting Physician (Rheumatology) Levert Feinstein, MD as Consulting Physician (Neurology) Armbruster, Willaim Rayas, MD as Consulting Physician (Gastroenterology) Josephine Igo, DO as Consulting Physician (Pulmonary Disease)   Past Medical History:  Diagnosis Date   Allergic rhinitis, cause unspecified    Allergy    Anxiety state, unspecified    Blood transfusion without reported diagnosis    2020   Cataract    removed both eyes    Chronic kidney disease    Disorder of bone and cartilage, unspecified    Esophageal candidiasis (HCC)    Foot drop    Gastric ulcer    History of CMV    History of Helicobacter pylori infection    Hypothyroid    Irritable bowel syndrome    Lumbago    Microscopic polyangiitis (HCC)    Other and unspecified hyperlipidemia    Pancreatic cyst    Perforation of tympanic membrane, unspecified    Unspecified essential hypertension     Past Surgical History:  Procedure Laterality Date   ABDOMINAL HYSTERECTOMY     BIOPSY  01/02/2019   Procedure: BIOPSY;  Surgeon: Benancio Deeds, MD;  Location: [facility] ENDOSCOPY;  Service: Gastroenterology;;   cataract surgery  5/12 and 12/24/2012   both eyes   COLONOSCOPY     COLONOSCOPY WITH PROPOFOL N/A 01/02/2019   Procedure: COLONOSCOPY WITH PROPOFOL;  Surgeon: Benancio Deeds, MD;  Location: [facility] ENDOSCOPY;  Service: Gastroenterology;  Laterality: N/A;    ESOPHAGOGASTRODUODENOSCOPY (EGD) WITH PROPOFOL N/A 01/02/2019   Procedure: ESOPHAGOGASTRODUODENOSCOPY (EGD) WITH PROPOFOL;  Surgeon: Benancio Deeds, MD;  Location: [facility] ENDOSCOPY;  Service: Gastroenterology;  Laterality: N/A;   NSVD     x1   POLYPECTOMY  01/02/2019   Procedure: POLYPECTOMY;  Surgeon: Benancio Deeds, MD;  Location[facility] ENDOSCOPY;  Service: Gastroenterology;;   POLYPECTOMY     TONSILLECTOMY     UPPER GASTROINTESTINAL ENDOSCOPY      Social History   Socioeconomic History   Marital status: Married    Spouse name: Fayrene Fearing   Number of children: 2   Years of education: 12   Highest education level: High school graduate  Occupational History   Occupation: retired Scientist, physiological  Tobacco Use   Smoking status: Never   Smokeless tobacco: Never  Vaping Use   Vaping status: Never Used  Substance and Sexual Activity   Alcohol use: No   Drug use: No   Sexual activity: Not Currently  Other Topics Concern   Not on file  Social History Narrative   Lives at home with her husband.   Right-handed.   1-2 cups coffee daily, occasional tea or soda.   Social Determinants of Health   Financial Resource Strain: Low Risk  (02/20/2022)   Overall Financial Resource Strain (CARDIA)    Difficulty of Paying Living Expenses: Not hard at all  Food Insecurity: Low Risk  (02/14/2023)   Received from [facility]   Food vital sign    Within the past 12 months, you worried that your food would run out before you got money to buy more: Never true    Within the past 12 months, the food you bought just didn't last and you didn't have money to get more. : Never true  Transportation Needs: Not on file (02/14/2023)  Physical Activity: Sufficiently Active (02/20/2022)   Exercise Vital Sign    Days of Exercise per Week: 6 days    Minutes of Exercise per Session: 30 min  Stress: No Stress Concern Present (02/20/2022)   [employer] of Occupational Health - Occupational Stress Questionnaire     Feeling of Stress : Not at all  Social Connections: Socially Integrated (02/20/2022)   Social Connection and Isolation Panel [NHANES]    Frequency of Communication with Friends and Family: More than three times a week    Frequency of Social Gatherings with Friends and Family: More than three times a week    Attends Religious Services: More than 4 times per year    Active Member of [employer] or Organizations: Yes    Attends Engineer, structural: More than 4 times per year    Marital Status: Married  Catering manager Violence: Not At Risk (02/20/2022)   Humiliation, Afraid, Rape, and Kick questionnaire    Fear of Current or Ex-Partner: No    Emotionally Abused: No    Physically Abused: No    Sexually Abused: No    Family History  Problem Relation Age of Onset   Lung cancer Mother 66   Lung disease Father 63   COPD Other        sibling   Colon cancer Neg Hx    Colon polyps Neg Hx    Esophageal cancer Neg Hx    Rectal cancer Neg Hx    Stomach cancer Neg Hx     Current Outpatient Medications on File Prior to Visit  Medication Sig Dispense Refill   acetaminophen (TYLENOL) 500 MG tablet Take 500 mg by mouth every 6 (six) hours as needed.     amLODipine (NORVASC) 5 MG tablet Take 5 mg by mouth daily.     atorvastatin (LIPITOR) 20 MG tablet TAKE 1 TABLET BY MOUTH DAILY 90 tablet 0   Avacopan (TAVNEOS) 10 MG CAPS Take 2 capsules by mouth twice daily with meals.     Cholecalciferol 25 MCG (1000 UT) tablet Take 1 capsule by mouth daily.     esomeprazole (NEXIUM) 40 MG capsule Take 1 capsule (40 mg total) by mouth daily. Please schedule a yearly office visit for further refills. Thank you 30 capsule 0   furosemide (LASIX) 40 MG tablet Take 0.5 tablets (20 mg total) by mouth 3 (three) times a week. Monday, Wed, Friday (Patient taking differently: Take 40 mg by mouth every Monday, Wednesday, and Friday.) 30 tablet    gabapentin (NEURONTIN) 100 MG capsule Take 2 capsules by mouth at night  for neuropathy pain (Patient taking differently: 100 mg 2 (two) times daily. Take 2 capsules by mouth at night for neuropathy pain) 180 capsule 3   levothyroxine (SYNTHROID) 50 MCG tablet TAKE ONE TABLET BY MOUTH EVERY MORNING BEFORE BREAKFAST 90 tablet 1   metoprolol succinate (TOPROL-XL) 50 MG 24 hr tablet TAKE 1 TABLET BY MOUTH 2 TIMES A DAY WITH A MEAL OR IMMEDIATELY FOLLOWING A MEAL 180 tablet 0   Multiple Vitamins-Minerals (CENTRUM SILVER 50+WOMEN) TABS Take 1 tablet by mouth daily with  breakfast.     silver sulfADIAZINE (SILVADENE) 1 % cream Apply 1 Application topically daily.     sulfamethoxazole-trimethoprim (BACTRIM DS) 800-160 MG tablet Take 1 tablet by mouth 3 (three) times a week. Takes on Monday/Wednesday/Friday     traMADol (ULTRAM) 50 MG tablet TAKE ONE TABLET BY MOUTH EVERY 6 HOURS AS NEEDED FOR UP TO 5 DAYS 30 tablet 0   triamcinolone cream (KENALOG) 0.1 % Apply 1 application. topically 2 (two) times daily. Compound in 1:1 ratio with Eucerin and apply to affected rash twice daily as  needed. 454 g 1   valACYclovir (VALTREX) 500 MG tablet Take 1 tablet by mouth daily.     VASCEPA 1 g capsule TAKE 2 CAPSULES BY MOUTH TWICE A DAY 120 capsule 5   COVID-19 mRNA bivalent vaccine, Pfizer, (PFIZER COVID-19 VAC BIVALENT) injection Inject into the muscle. 0.3 mL 0   Loratadine (CLARITIN) 10 MG CAPS Take 10 mg by mouth daily. (Patient not taking: Reported on 02/25/2023)     No current facility-administered medications on file prior to visit.    Allergies  Allergen Reactions   Alendronate Sodium Shortness Of Breath   Amoxicillin Diarrhea    Severe diarrhea   Codeine Nausea Only   Valsartan Swelling    Gums/throat swell       Physical Exam Vitals requested from patient and listed below if patient had equipment and was able to obtain at home for this virtual visit: There were no vitals filed for this visit. Estimated body mass index is 25.39 kg/m as calculated from the  following:   Height as of this encounter: 5' (1.524 m).   Weight as of this encounter: 130 lb (59 kg).  EKG (optional): deferred due to virtual visit  GENERAL: alert, oriented, no acute distress detected, full vision exam deferred due to pandemic and/or virtual encounter  PSYCH/NEURO: pleasant and cooperative, no obvious depression or anxiety, speech and thought processing grossly intact, Cognitive function grossly intact  Flowsheet Row Video Visit from 02/25/2023 in [facility]  PHQ-9 Total Score 1           02/25/2023    4:14 PM 12/25/2022    2:54 PM 02/20/2022   12:48 PM 07/17/2021    3:29 PM 08/23/2019   10:49 AM  Depression screen PHQ 2/9  Decreased Interest 0 0 0 0 0  Down, Depressed, Hopeless 0 0 0 0 0  PHQ - 2 Score 0 0 0 0 0  Altered sleeping 0      Tired, decreased energy 0      Change in appetite 0      Feeling bad or failure about yourself  0      Trouble concentrating 1      Moving slowly or fidgety/restless 0      Suicidal thoughts 0      PHQ-9 Score 1      Difficult doing work/chores Not difficult at all           08/23/2019   10:48 AM 10/29/2021    3:33 PM 02/20/2022   12:58 PM 12/25/2022    2:54 PM 02/25/2023    4:14 PM  Fall Risk  Falls in the past year? 0 0 0 0 0  Was there an injury with Fall?  0 0  0  Fall Risk Category Calculator  0 0  0  Fall Risk Category (Retired)  Low Low    (RETIRED) Patient Fall Risk Level Moderate fall  risk Low fall risk Low fall risk    Patient at Risk for Falls Due to Medication side effect;Impaired mobility No Fall Risks No Fall Risks Impaired balance/gait No Fall Risks  Fall risk Follow up Falls evaluation completed;Education provided;Falls prevention discussed Falls evaluation completed  Falls evaluation completed Falls evaluation completed     SUMMARY AND PLAN:  Encounter for Medicare annual wellness exam   Discussed applicable health maintenance/preventive health measures and  advised and referred or ordered per patient preferences:  Health Maintenance  Topic Date Due   COVID-19 Vaccine (5 - 2023-24 season) 03/13/2023 (Originally 07/30/2022)   INFLUENZA VACCINE  02/27/2023   Medicare Annual Wellness (AWV)  02/25/2024   DTaP/Tdap/Td (3 - Td or Tdap) 01/22/2032   Pneumonia Vaccine 18+ Years old  Completed   DEXA SCAN  Completed   Zoster Vaccines- Shingrix  Completed   HPV VACCINES  Aged Out    Education and counseling on the following was provided based on the above review of health and a plan/checklist for the patient, along with additional information discussed, was provided for the patient in the patient instructions :   -Provided counseling and plan for increased risk of falling if applicable per above screening. She is using walker and doing safe balance exercises. Congratulated and encouraged to continue.  -Advised and counseled on a healthy lifestyle - including the importance of a healthy diet, regular physical activity, social connections and stress management. -Reviewed patient's current diet. Advised and counseled on a whole foods based healthy diet. A summary of a healthy diet was provided in the Patient Instructions. Discussed cutting down on sugar and sugary beverages and having fresh or frozen berries when craving sweets instead.  -reviewed patient's current physical activity level and discussed exercise guidelines for adults.Congratulated on regular exercise.  Discussed safe exercise at home to assist in meeting exercise guideline recommendations in a safe and healthy way.  -Advise yearly dental visits at minimum and regular eye exams - they agree to call and schedule  Follow up: see patient instructions     Patient Instructions  I really enjoyed getting to talk with you today! I am available on Tuesdays and Thursdays for virtual visits if you have any questions or concerns, or if I can be of any further assistance.   CHECKLIST FROM ANNUAL  WELLNESS VISIT:  -Follow up (please call to schedule if not scheduled after visit):   -yearly for annual wellness visit with primary care office  Here is a list of your preventive care/health maintenance measures and the plan for each if any are due:  PLAN For any measures below that may be due:   Health Maintenance  Topic Date Due   COVID-19 Vaccine (5 - 2023-24 season) 03/13/2023 (Originally 07/30/2022)   INFLUENZA VACCINE  02/27/2023   Medicare Annual Wellness (AWV)  02/25/2024   DTaP/Tdap/Td (3 - Td or Tdap) 01/22/2032   Pneumonia Vaccine 19+ Years old  Completed   DEXA SCAN  Completed   Zoster Vaccines- Shingrix  Completed   HPV VACCINES  Aged Out    -See a dentist at least yearly  -Get your eyes checked and then per your eye specialist's recommendations  -Other issues addressed today:   -I have included below further information regarding a healthy whole foods based diet, physical activity guidelines for adults, stress management and opportunities for social connections. I hope you find this information useful.   -----------------------------------------------------------------------------------------------------------------------------------------------------------------------------------------------------------------------------------------------------------  NUTRITION: -eat real food: lots of colorful vegetables (half the plate)  and fruits -5-7 servings of vegetables and fruits per day (fresh or steamed is best), exp. 2 servings of vegetables with lunch and dinner and 2 servings of fruit per day. Berries and greens such as kale and collards are great choices.  -consume on a regular basis: whole grains (make sure first ingredient on label contains the word "whole"), fresh fruits, fish, nuts, seeds, healthy oils (such as olive oil, avocado oil, grape seed oil) -may eat small amounts of dairy and lean meat on occasion, but avoid processed meats such as ham, bacon, lunch meat,  etc. -drink water -try to avoid fast food and pre-packaged foods, processed meat -most experts advise limiting sodium to < 2300mg  per day, should limit further is any chronic conditions such as high blood pressure, heart disease, diabetes, etc. The American Heart Association advised that < 1500mg  is is ideal -try to avoid foods that contain any ingredients with names you do not recognize  -try to avoid sugar/sweets (except for the natural sugar that occurs in fresh fruit) -try to avoid sweet drinks -try to avoid white rice, white bread, pasta (unless whole grain), white or yellow potatoes  EXERCISE GUIDELINES FOR ADULTS: -if you wish to increase your physical activity, do so gradually and with the approval of your doctor -STOP and seek medical care immediately if you have any chest pain, chest discomfort or trouble breathing when starting or increasing exercise  -move and stretch your body, legs, feet and arms when sitting for long periods -Physical activity guidelines for optimal health in adults: -least 150 minutes per week of aerobic exercise (can talk, but not sing) once approved by your doctor, 20-30 minutes of sustained activity or two 10 minute episodes of sustained activity every day.  -resistance training at least 2 days per week if approved by your doctor -balance exercises 3+ days per week:   Stand somewhere where you have something sturdy to hold onto if you lose balance.    1) lift up on toes, start with 5x per day and work up to 20x   2) stand and lift on leg straight out to the side so that foot is a few inches of the floor, start with 5x each side and work up to 20x each side   3) stand on one foot, start with 5 seconds each side and work up to 20 seconds on each side  If you need ideas or help with getting more active:  -Silver sneakers https://tools.silversneakers.com  -Walk with a Doc: http://www.duncan-williams.com/  -try to include resistance (weight lifting/strength  building) and balance exercises twice per week: or the following link for ideas: http://castillo-powell.com/  BuyDucts.dk  STRESS MANAGEMENT: -can try meditating, or just sitting quietly with deep breathing while intentionally relaxing all parts of your body for 5 minutes daily -if you need further help with stress, anxiety or depression please follow up with your primary doctor or contact the wonderful folks at [facility]: [phone]  SOCIAL CONNECTIONS: -options in [place] if you wish to engage in more social and exercise related activities:  -Silver sneakers https://tools.silversneakers.com  -Walk with a Doc: http://www.duncan-williams.com/  -Check out the [facility] Active Adults 50+ section on the [place] of [employer] (hiking clubs, book clubs, cards and games, chess, exercise classes, aquatic classes and much more) - see the website for details: https://www.-[place].gov/departments/parks-recreation/active-adults50  -YouTube has lots of exercise videos for different ages and abilities as well  -[facility] (a variety of indoor and outdoor inperson activities for adults).  [phone]. [address]  -Virtual Online Classes (a variety of topics): see seniorplanet.org or call ([phone]  -consider volunteering at a school, hospice center, church, senior center or elsewhere           Terressa Koyanagi, DO

## 2023-02-25 NOTE — Patient Instructions (Signed)
I really enjoyed getting to talk with you today! I am available on Tuesdays and Thursdays for virtual visits if you have any questions or concerns, or if I can be of any further assistance.   CHECKLIST FROM ANNUAL WELLNESS VISIT:  -Follow up (please call to schedule if not scheduled after visit):   -yearly for annual wellness visit with primary care office  Here is a list of your preventive care/health maintenance measures and the plan for each if any are due:  PLAN For any measures below that may be due:   Health Maintenance  Topic Date Due   COVID-19 Vaccine (5 - 2023-24 season) 03/13/2023 (Originally 07/30/2022)   INFLUENZA VACCINE  02/27/2023   Medicare Annual Wellness (AWV)  02/25/2024   DTaP/Tdap/Td (3 - Td or Tdap) 01/22/2032   Pneumonia Vaccine 44+ Years old  Completed   DEXA SCAN  Completed   Zoster Vaccines- Shingrix  Completed   HPV VACCINES  Aged Out    -See a dentist at least yearly  -Get your eyes checked and then per your eye specialist's recommendations  -Other issues addressed today:   -I have included below further information regarding a healthy whole foods based diet, physical activity guidelines for adults, stress management and opportunities for social connections. I hope you find this information useful.   -----------------------------------------------------------------------------------------------------------------------------------------------------------------------------------------------------------------------------------------------------------  NUTRITION: -eat real food: lots of colorful vegetables (half the plate) and fruits -5-7 servings of vegetables and fruits per day (fresh or steamed is best), exp. 2 servings of vegetables with lunch and dinner and 2 servings of fruit per day. Berries and greens such as kale and collards are great choices.  -consume on a regular basis: whole grains (make sure first ingredient on label contains the word  "whole"), fresh fruits, fish, nuts, seeds, healthy oils (such as olive oil, avocado oil, grape seed oil) -may eat small amounts of dairy and lean meat on occasion, but avoid processed meats such as ham, bacon, lunch meat, etc. -drink water -try to avoid fast food and pre-packaged foods, processed meat -most experts advise limiting sodium to < 2300mg  per day, should limit further is any chronic conditions such as high blood pressure, heart disease, diabetes, etc. The American Heart Association advised that < 1500mg  is is ideal -try to avoid foods that contain any ingredients with names you do not recognize  -try to avoid sugar/sweets (except for the natural sugar that occurs in fresh fruit) -try to avoid sweet drinks -try to avoid white rice, white bread, pasta (unless whole grain), white or yellow potatoes  EXERCISE GUIDELINES FOR ADULTS: -if you wish to increase your physical activity, do so gradually and with the approval of your doctor -STOP and seek medical care immediately if you have any chest pain, chest discomfort or trouble breathing when starting or increasing exercise  -move and stretch your body, legs, feet and arms when sitting for long periods -Physical activity guidelines for optimal health in adults: -least 150 minutes per week of aerobic exercise (can talk, but not sing) once approved by your doctor, 20-30 minutes of sustained activity or two 10 minute episodes of sustained activity every day.  -resistance training at least 2 days per week if approved by your doctor -balance exercises 3+ days per week:   Stand somewhere where you have something sturdy to hold onto if you lose balance.    1) lift up on toes, start with 5x per day and work up to 20x   2) stand and lift on  leg straight out to the side so that foot is a few inches of the floor, start with 5x each side and work up to 20x each side   3) stand on one foot, start with 5 seconds each side and work up to 20 seconds on  each side  If you need ideas or help with getting more active:  -Silver sneakers https://tools.silversneakers.com  -Walk with a Doc: http://www.duncan-williams.com/  -try to include resistance (weight lifting/strength building) and balance exercises twice per week: or the following link for ideas: http://castillo-powell.com/  BuyDucts.dk  STRESS MANAGEMENT: -can try meditating, or just sitting quietly with deep breathing while intentionally relaxing all parts of your body for 5 minutes daily -if you need further help with stress, anxiety or depression please follow up with your primary doctor or contact the wonderful folks at [facility]: [phone]  SOCIAL CONNECTIONS: -options in Blue if you wish to engage in more social and exercise related activities:  -Silver sneakers https://tools.silversneakers.com  -Walk with a Doc: http://www.duncan-williams.com/  -Check out the [facility] Active Adults 50+ section on the [place] [employer] (hiking clubs, book clubs, cards and games, chess, exercise classes, aquatic classes and much more) - see the website for details: https://www.[place].gov/departments/parks-recreation/active-adults50  -YouTube has lots of exercise videos for different ages and abilities as well  -[facility] (a variety of indoor and outdoor inperson activities for adults). [phone]. [address]  -Virtual Online Classes (a variety of topics): see seniorplanet.org or call ([phone]  -consider volunteering at a school, hospice center, church, senior center or elsewhere

## 2023-02-28 ENCOUNTER — Other Ambulatory Visit: Payer: Self-pay | Admitting: Gastroenterology

## 2023-03-03 ENCOUNTER — Encounter: Payer: Self-pay | Admitting: Internal Medicine

## 2023-03-03 ENCOUNTER — Ambulatory Visit (INDEPENDENT_AMBULATORY_CARE_PROVIDER_SITE_OTHER): Payer: Medicare Other | Admitting: Internal Medicine

## 2023-03-03 ENCOUNTER — Other Ambulatory Visit: Payer: Self-pay

## 2023-03-03 VITALS — BP 132/74 | HR 55 | Temp 97.2°F | Ht 60.0 in | Wt 130.0 lb

## 2023-03-03 DIAGNOSIS — Z87440 Personal history of urinary (tract) infections: Secondary | ICD-10-CM

## 2023-03-03 DIAGNOSIS — R300 Dysuria: Secondary | ICD-10-CM | POA: Diagnosis present

## 2023-03-03 NOTE — Progress Notes (Signed)
RFV: hx of recurrent uti Patient ID: Emily Cannon, female   DOB: 1933/01/07, 89 y.o.   MRN: [id number]  HPI Weston is a 89yo F with history of microscopic polyangiitis and followed by rheumatology and on rituxan and tavneos for treatment. She also takes Bactrim for PJP prophylaxis.   More frequent and more burning of late. Noticing every 3 weeks. -- has burning sensation and frequency for 4 wks. Also having nocturnal urination  Last was on abtx 6-7 weeks ago dispensed by her nephrologist -- Dr. Malen Gauze.   Outpatient Encounter Medications as of 03/03/2023  Medication Sig   acetaminophen (TYLENOL) 500 MG tablet Take 500 mg by mouth every 6 (six) hours as needed.   amLODipine (NORVASC) 5 MG tablet Take 5 mg by mouth daily.   atorvastatin (LIPITOR) 20 MG tablet TAKE 1 TABLET BY MOUTH DAILY   Avacopan (TAVNEOS) 10 MG CAPS Take 2 capsules by mouth twice daily with meals.   Cholecalciferol 25 MCG (1000 UT) tablet Take 1 capsule by mouth daily.   COVID-19 mRNA bivalent vaccine, Pfizer, (PFIZER COVID-19 VAC BIVALENT) injection Inject into the muscle.   esomeprazole (NEXIUM) 40 MG capsule Take 1 capsule (40 mg total) by mouth daily. Please schedule a yearly office visit for further refills. Thank you   furosemide (LASIX) 40 MG tablet Take 0.5 tablets (20 mg total) by mouth 3 (three) times a week. Monday, Wed, Friday (Patient taking differently: Take 40 mg by mouth every Monday, Wednesday, and Friday.)   gabapentin (NEURONTIN) 100 MG capsule Take 2 capsules by mouth at night for neuropathy pain (Patient taking differently: 100 mg 2 (two) times daily. Take 2 capsules by mouth at night for neuropathy pain)   levothyroxine (SYNTHROID) 50 MCG tablet TAKE ONE TABLET BY MOUTH EVERY MORNING BEFORE BREAKFAST   Loratadine (CLARITIN) 10 MG CAPS Take 10 mg by mouth daily. (Patient not taking: Reported on 02/25/2023)   metoprolol succinate (TOPROL-XL) 50 MG 24 hr tablet TAKE 1 TABLET BY MOUTH 2 TIMES A DAY WITH  A MEAL OR IMMEDIATELY FOLLOWING A MEAL   Multiple Vitamins-Minerals (CENTRUM SILVER 50+WOMEN) TABS Take 1 tablet by mouth daily with breakfast.   silver sulfADIAZINE (SILVADENE) 1 % cream Apply 1 Application topically daily.   sulfamethoxazole-trimethoprim (BACTRIM DS) 800-160 MG tablet Take 1 tablet by mouth 3 (three) times a week. Takes on Monday/Wednesday/Friday   traMADol (ULTRAM) 50 MG tablet TAKE ONE TABLET BY MOUTH EVERY 6 HOURS AS NEEDED FOR UP TO 5 DAYS   triamcinolone cream (KENALOG) 0.1 % Apply 1 application. topically 2 (two) times daily. Compound in 1:1 ratio with Eucerin and apply to affected rash twice daily as  needed.   valACYclovir (VALTREX) 500 MG tablet Take 1 tablet by mouth daily.   VASCEPA 1 g capsule TAKE 2 CAPSULES BY MOUTH TWICE A DAY   No facility-administered encounter medications on file as of 03/03/2023.     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) 09/25/2020   Peripheral neuropathy 05/08/2020   Weakness 03/28/2020   Gait abnormality 03/28/2020   Paresthesia 03/28/2020   Left foot drop 03/23/2020   Heme positive stool    Benign neoplasm of colon    Acute blood loss anemia 12/30/2018   Symptomatic anemia 12/15/2018   AKI (acute kidney injury) (HCC) 12/15/2018   Hypomagnesemia 12/15/2018   Steroid-induced hyperglycemia 12/15/2018   Microscopic polyangiitis (HCC) 11/13/2018   Positive  P-ANCA titer 10/14/2018   Viral URI 05/12/2018   Syncope 06/05/2016   Unspecified venous (peripheral) insufficiency 11/23/2012   Dermatitis 11/10/2012   Hypothyroidism 05/22/2011   TYMPANIC MEMBRANE PERFORATION, LEFT EAR 08/08/2007   Osteopenia 07/27/2007   Hyperlipidemia 05/11/2007   Anxiety state 05/11/2007   Essential hypertension 05/11/2007   ALLERGIC RHINITIS 05/11/2007   Irritable bowel syndrome 05/11/2007   LOW BACK PAIN 05/11/2007   HYSTERECTOMY, HX OF 05/11/2007      Health Maintenance Due  Topic Date Due   INFLUENZA VACCINE  02/27/2023     Review of Systems  Physical Exam   There were no vitals taken for this visit.   No results found for: "CD4TCELL" No results found for: "CD4TABS" No results found for: "HIV1RNAQUANT" No results found for: "HEPBSAB" Lab Results  Component Value Date   LABRPR Non Reactive 07/18/2020    CBC Lab Results  Component Value Date   WBC 4.4 08/30/2022   RBC 4.55 08/30/2022   HGB 13.8 08/30/2022   HCT 40.8 08/30/2022   PLT 197.0 08/30/2022   MCV 89.6 08/30/2022   MCH 30.4 03/24/2020   MCHC 33.9 08/30/2022   RDW 13.5 08/30/2022   LYMPHSABS 2.1 08/30/2022   MONOABS 0.8 08/30/2022   EOSABS 0.1 08/30/2022    BMET Lab Results  Component Value Date   NA 139 05/22/2022   K 4.1 05/22/2022   CL 102 05/22/2022   CO2 29 05/22/2022   GLUCOSE 110 (H) 05/22/2022   BUN 15 05/22/2022   CREATININE 1.17 05/22/2022   CALCIUM 9.5 05/22/2022   GFRNONAA 20 (L) 01/03/2019   GFRAA 23 (L) 01/03/2019      Assessment and Plan   Urinalysis and urine cx.  Pyrudium has not worked in the past. If no uti based on studies, may consider a trial of Methenamine hippurate.  Will call back on Thursday for results

## 2023-03-04 ENCOUNTER — Other Ambulatory Visit: Payer: Self-pay | Admitting: Gastroenterology

## 2023-03-05 ENCOUNTER — Ambulatory Visit: Payer: Medicare Other | Admitting: Internal Medicine

## 2023-03-05 ENCOUNTER — Other Ambulatory Visit: Payer: Self-pay | Admitting: Gastroenterology

## 2023-03-06 ENCOUNTER — Other Ambulatory Visit: Payer: Self-pay | Admitting: Internal Medicine

## 2023-03-06 ENCOUNTER — Telehealth: Payer: Self-pay

## 2023-03-06 MED ORDER — CEPHALEXIN 500 MG PO CAPS: 500.0000 mg | ORAL_CAPSULE | Freq: Four times a day (QID) | ORAL | 0 refills | Status: DC

## 2023-03-06 NOTE — Telephone Encounter (Signed)
Per Dr. Drue Second patient's urine cx grew + e.coli. and Keflex sent in to the pharmacy for treatment.  Patient's husband Rosanne Ashing informed of results and Rx. He verbalized understanding.  T Pricilla Loveless

## 2023-03-18 ENCOUNTER — Other Ambulatory Visit: Payer: Self-pay | Admitting: Family Medicine

## 2023-03-25 ENCOUNTER — Ambulatory Visit (INDEPENDENT_AMBULATORY_CARE_PROVIDER_SITE_OTHER): Payer: Medicare Other | Admitting: Podiatry

## 2023-03-25 DIAGNOSIS — M79675 Pain in left toe(s): Secondary | ICD-10-CM | POA: Diagnosis not present

## 2023-03-25 DIAGNOSIS — M79674 Pain in right toe(s): Secondary | ICD-10-CM | POA: Diagnosis not present

## 2023-03-25 DIAGNOSIS — B351 Tinea unguium: Secondary | ICD-10-CM

## 2023-03-25 NOTE — Progress Notes (Signed)
Subjective:   Patient ID: Emily Cannon, female   DOB: 89 y.o.   MRN: [id number]   HPI Patient presents elongated nailbeds 1-5 both feet painful   ROS      Objective:  Physical Exam  Neurovascular status unchanged thick yellow brittle nailbeds 1-5 both feet painful     Assessment:  Mycotic nail infection 1-5 both feet     Plan:  Debridement painful nailbeds 1-5 both feet neurogenic bleeding reappoint routine care

## 2023-04-04 ENCOUNTER — Other Ambulatory Visit: Payer: Self-pay | Admitting: Gastroenterology

## 2023-04-06 ENCOUNTER — Other Ambulatory Visit: Payer: Self-pay | Admitting: Gastroenterology

## 2023-04-07 ENCOUNTER — Telehealth: Payer: Self-pay | Admitting: Gastroenterology

## 2023-04-07 ENCOUNTER — Other Ambulatory Visit: Payer: Self-pay

## 2023-04-07 MED ORDER — ESOMEPRAZOLE MAGNESIUM 40 MG PO CPDR: 40.0000 mg | DELAYED_RELEASE_CAPSULE | Freq: Every day | ORAL | 2 refills | Status: DC

## 2023-04-07 NOTE — Telephone Encounter (Signed)
Refill of Nexium sent to get to November appointment

## 2023-04-07 NOTE — Telephone Encounter (Signed)
Patient husband called to schedule a follow up appt and ask for a refill on Nexium.

## 2023-04-08 ENCOUNTER — Other Ambulatory Visit: Payer: Self-pay

## 2023-04-08 ENCOUNTER — Encounter: Payer: Self-pay | Admitting: Internal Medicine

## 2023-04-08 ENCOUNTER — Ambulatory Visit (INDEPENDENT_AMBULATORY_CARE_PROVIDER_SITE_OTHER): Payer: Medicare Other | Admitting: Internal Medicine

## 2023-04-08 VITALS — BP 158/72 | HR 58 | Temp 97.5°F | Wt 137.8 lb

## 2023-04-08 DIAGNOSIS — R300 Dysuria: Secondary | ICD-10-CM

## 2023-04-08 LAB — CBC WITH DIFFERENTIAL/PLATELET
Absolute Monocytes: 823 {cells}/uL (ref 200–950)
Basophils Absolute: 39 {cells}/uL (ref 0–200)
Basophils Relative: 0.7 %
Eosinophils Absolute: 190 {cells}/uL (ref 15–500)
Eosinophils Relative: 3.4 %
HCT: 40.8 % (ref 35.0–45.0)
Hemoglobin: 14.3 g/dL (ref 11.7–15.5)
Lymphs Abs: 2257 {cells}/uL (ref 850–3900)
MCH: 31.6 pg (ref 27.0–33.0)
MCHC: 35 g/dL (ref 32.0–36.0)
MCV: 90.1 fL (ref 80.0–100.0)
MPV: 10.8 fL (ref 7.5–12.5)
Monocytes Relative: 14.7 %
Neutro Abs: 2290 {cells}/uL (ref 1500–7800)
Neutrophils Relative %: 40.9 %
Platelets: 169 10*3/uL (ref 140–400)
RBC: 4.53 10*6/uL (ref 3.80–5.10)
RDW: 13.6 % (ref 11.0–15.0)
Total Lymphocyte: 40.3 %
WBC: 5.6 10*3/uL (ref 3.8–10.8)

## 2023-04-08 MED ORDER — METHENAMINE HIPPURATE 1 G PO TABS
1.0000 g | ORAL_TABLET | Freq: Two times a day (BID) | ORAL | 3 refills | Status: DC
Start: 2023-04-08 — End: 2023-08-18

## 2023-04-08 NOTE — Progress Notes (Signed)
Rfv: recurrent uti  Patient ID: Emily Cannon, female   DOB: 1932-12-11, 89 y.o.   MRN: [id number]  HPI 89yo F with recurrent uti. She was recently treated for uti and stated that the symptoms improved while on abtx but then after 4-5 days she has started noticed the recurrence of Dysuria+ occasional flank pain. She has not seen urology in the past.  Outpatient Encounter Medications as of 04/08/2023  Medication Sig   acetaminophen (TYLENOL) 500 MG tablet Take 500 mg by mouth every 6 (six) hours as needed.   amLODipine (NORVASC) 5 MG tablet Take 5 mg by mouth daily.   atorvastatin (LIPITOR) 20 MG tablet TAKE 1 TABLET BY MOUTH DAILY   Avacopan (TAVNEOS) 10 MG CAPS Take 2 capsules by mouth twice daily with meals.   Cholecalciferol 25 MCG (1000 UT) tablet Take 1 capsule by mouth daily.   esomeprazole (NEXIUM) 40 MG capsule Take 1 capsule (40 mg total) by mouth daily. Please keep your November appointment for further refills.   furosemide (LASIX) 40 MG tablet Take 0.5 tablets (20 mg total) by mouth 3 (three) times a week. Monday, Wed, Friday (Patient taking differently: Take 40 mg by mouth every Monday, Wednesday, and Friday.)   gabapentin (NEURONTIN) 100 MG capsule Take 2 capsules by mouth at night for neuropathy pain (Patient taking differently: 100 mg 2 (two) times daily. Take 2 capsules by mouth at night for neuropathy pain)   levothyroxine (SYNTHROID) 50 MCG tablet TAKE ONE TABLET BY MOUTH EVERY MORNING BEFORE BREAKFAST   Loratadine (CLARITIN) 10 MG CAPS Take 10 mg by mouth daily.   metoprolol succinate (TOPROL-XL) 50 MG 24 hr tablet TAKE 1 TABLET BY MOUTH 2 TIMES A DAY WITH A MEAL OR IMMEDIATELY FOLLOWING A MEAL   Multiple Vitamins-Minerals (CENTRUM SILVER 50+WOMEN) TABS Take 1 tablet by mouth daily with breakfast.   silver sulfADIAZINE (SILVADENE) 1 % cream Apply 1 Application topically daily.   sulfamethoxazole-trimethoprim (BACTRIM DS) 800-160 MG tablet Take 1 tablet by mouth 3 (three)  times a week. Takes on Monday/Wednesday/Friday   traMADol (ULTRAM) 50 MG tablet TAKE ONE TABLET BY MOUTH EVERY 6 HOURS AS NEEDED FOR UP TO 5 DAYS   triamcinolone cream (KENALOG) 0.1 % Apply 1 application. topically 2 (two) times daily. Compound in 1:1 ratio with Eucerin and apply to affected rash twice daily as  needed.   valACYclovir (VALTREX) 500 MG tablet Take 1 tablet by mouth daily.   VASCEPA 1 g capsule TAKE 2 CAPSULES BY MOUTH TWICE A DAY   cephALEXin (KEFLEX) 500 MG capsule Take 1 capsule (500 mg total) by mouth 4 (four) times daily. (Patient not taking: Reported on 04/08/2023)   COVID-19 mRNA bivalent vaccine, Pfizer, (PFIZER COVID-19 VAC BIVALENT) injection Inject into the muscle. (Patient not taking: Reported on 04/08/2023)   No facility-administered encounter medications on file as of 04/08/2023.     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) 09/25/2020   Peripheral neuropathy 05/08/2020   Weakness 03/28/2020   Gait abnormality 03/28/2020   Paresthesia 03/28/2020   Left foot drop 03/23/2020   Heme positive stool    Benign neoplasm of colon    Acute blood loss anemia 12/30/2018   Symptomatic anemia 12/15/2018   AKI (acute kidney injury) (HCC) 12/15/2018   Hypomagnesemia 12/15/2018   Steroid-induced hyperglycemia 12/15/2018   Microscopic polyangiitis (HCC) 11/13/2018   Positive P-ANCA titer 10/14/2018   Viral  URI 05/12/2018   Syncope 06/05/2016   Unspecified venous (peripheral) insufficiency 11/23/2012   Dermatitis 11/10/2012   Hypothyroidism 05/22/2011   TYMPANIC MEMBRANE PERFORATION, LEFT EAR 08/08/2007   Osteopenia 07/27/2007   Hyperlipidemia 05/11/2007   Anxiety state 05/11/2007   Essential hypertension 05/11/2007   ALLERGIC RHINITIS 05/11/2007   Irritable bowel syndrome 05/11/2007   LOW BACK PAIN 05/11/2007   HYSTERECTOMY, HX OF 05/11/2007     Health Maintenance  Due  Topic Date Due   INFLUENZA VACCINE  02/27/2023   COVID-19 Vaccine (5 - 2023-24 season) 03/30/2023     Review of Systems 12 point ros is negative except what is mentioned above Physical Exam   Wt 137 lb 12.8 oz (62.5 kg)   BMI 26.91 kg/m   Physical Exam  Constitutional:  oriented to person, place, and time. appears well-developed and well-nourished. No distress.  HENT: [place]/AT, PERRLA, no scleral icterus Mouth/Throat: Oropharynx is clear and moist. No oropharyngeal exudate.  Cardiovascular: Normal rate, regular rhythm and normal heart sounds. Exam reveals no gallop and no friction rub.  No murmur heard.  Pulmonary/Chest: Effort normal and breath sounds normal. No respiratory distress.  has no wheezes.  Neck = supple, no nuchal rigidity Abdominal: Soft. Bowel sounds are normal.  exhibits no distension. There is no tenderness.  Back: no flank pain Neurological: alert and oriented to person, place, and time.  Skin: Skin is warm and dry. No rash noted. No erythema.  Psychiatric: a normal mood and affect.  behavior is normal.   Lab Results  Component Value Date   LABRPR Non Reactive 07/18/2020    CBC Lab Results  Component Value Date   WBC 4.4 08/30/2022   RBC 4.55 08/30/2022   HGB 13.8 08/30/2022   HCT 40.8 08/30/2022   PLT 197.0 08/30/2022   MCV 89.6 08/30/2022   MCH 30.4 03/24/2020   MCHC 33.9 08/30/2022   RDW 13.5 08/30/2022   LYMPHSABS 2.1 08/30/2022   MONOABS 0.8 08/30/2022   EOSABS 0.1 08/30/2022    BMET Lab Results  Component Value Date   NA 139 05/22/2022   K 4.1 05/22/2022   CL 102 05/22/2022   CO2 29 05/22/2022   GLUCOSE 110 (H) 05/22/2022   BUN 15 05/22/2022   CREATININE 1.17 05/22/2022   CALCIUM 9.5 05/22/2022   GFRNONAA 20 (L) 01/03/2019   GFRAA 23 (L) 01/03/2019      Assessment and Plan Dysuria-- concern for recurrent uti Will get ua and urine cx to see if need to treat and cbc Plan to start on hiprex to see if improves symptoms  Rtc  in 4 wk  I have personally spent 20 minutes involved in face-to-face and non-face-to-face activities for this patient on the day of the visit. Professional time spent includes the following activities: Preparing to see the patient (review of tests), Obtaining and/or reviewing separately obtained history (admission/discharge record), Performing a medically appropriate examination and/or evaluation , Ordering medications/tests/procedures, referring and communicating with other health care professionals, Documenting clinical information in the EMR, Independently interpreting results (not separately reported), Communicating results to the patient/family/caregiver, Counseling and educating the patient/family/caregiver and Care coordination (not separately reported).

## 2023-04-09 LAB — URINALYSIS, ROUTINE W REFLEX MICROSCOPIC
Bilirubin Urine: NEGATIVE
Glucose, UA: NEGATIVE
Hyaline Cast: NONE SEEN /LPF
Ketones, ur: NEGATIVE
Nitrite: NEGATIVE
Specific Gravity, Urine: 1.015 (ref 1.001–1.035)
pH: 5.5 (ref 5.0–8.0)

## 2023-04-09 LAB — MICROSCOPIC MESSAGE

## 2023-04-10 ENCOUNTER — Telehealth: Payer: Self-pay

## 2023-04-10 NOTE — Telephone Encounter (Signed)
Received fax from [facility] for clarification. Patient is taking Bactrim on Mondays, Wednesday and Fridays and there is interaction with methenamine. The two abx can't be taken at the same time. Patient husband also called wanting to know urine results.   Alicen Donalson Lesli Albee, CMA

## 2023-04-11 ENCOUNTER — Other Ambulatory Visit: Payer: Self-pay

## 2023-04-11 MED ORDER — CEPHALEXIN 500 MG PO CAPS: 500.0000 mg | ORAL_CAPSULE | Freq: Three times a day (TID) | ORAL | 0 refills | Status: AC

## 2023-04-12 ENCOUNTER — Other Ambulatory Visit: Payer: Self-pay | Admitting: Family Medicine

## 2023-04-12 DIAGNOSIS — I10 Essential (primary) hypertension: Secondary | ICD-10-CM

## 2023-04-14 ENCOUNTER — Telehealth: Payer: Self-pay

## 2023-04-14 NOTE — Telephone Encounter (Signed)
Patient's spouse called office stating that yesterday after taking Keflex had abdominal cramps, nausea/ vomiting, and diarrhea. Has stopped taking Keflex and would like to know if they should pickup Hiprex.  Per Dr. Drue Second will call patient later today for more information. Spouse made aware. Understands to keep an eye for call. Juanita Laster, RMA

## 2023-04-21 ENCOUNTER — Other Ambulatory Visit: Payer: Self-pay | Admitting: Internal Medicine

## 2023-04-21 DIAGNOSIS — R300 Dysuria: Secondary | ICD-10-CM

## 2023-04-24 ENCOUNTER — Telehealth: Payer: Self-pay

## 2023-04-24 NOTE — Telephone Encounter (Signed)
Received a call from [facility] at [facility] [phone]) stating that Dr. Reece Agar wanted to know the reasoning for Dr. Drue Second taking the patient off bactrim and can he continue her on it for prophylaxis for PJP.  Per Dr. Drue Second she was told by the patent that she was taking it for uti prevention and she should continue to take it for prophylaxis for PJP. I relayed message to Arline Asp, who verbalized her understanding.

## 2023-04-25 ENCOUNTER — Ambulatory Visit: Payer: Medicare Other | Admitting: Obstetrics

## 2023-04-29 ENCOUNTER — Other Ambulatory Visit (HOSPITAL_COMMUNITY)
Admission: RE | Admit: 2023-04-29 | Discharge: 2023-04-29 | Disposition: A | Discharge status: Home / Self Care | Payer: Medicare Other | Source: Other Acute Inpatient Hospital | Attending: Obstetrics | Admitting: Obstetrics

## 2023-04-29 ENCOUNTER — Ambulatory Visit (INDEPENDENT_AMBULATORY_CARE_PROVIDER_SITE_OTHER): Payer: Medicare Other | Admitting: Obstetrics

## 2023-04-29 ENCOUNTER — Encounter: Payer: Self-pay | Admitting: Obstetrics

## 2023-04-29 VITALS — BP 134/81 | HR 92 | Ht 61.02 in | Wt 136.0 lb

## 2023-04-29 DIAGNOSIS — R82998 Other abnormal findings in urine: Secondary | ICD-10-CM | POA: Insufficient documentation

## 2023-04-29 DIAGNOSIS — N3941 Urge incontinence: Secondary | ICD-10-CM | POA: Insufficient documentation

## 2023-04-29 DIAGNOSIS — R351 Nocturia: Secondary | ICD-10-CM | POA: Insufficient documentation

## 2023-04-29 DIAGNOSIS — R8290 Unspecified abnormal findings in urine: Secondary | ICD-10-CM | POA: Insufficient documentation

## 2023-04-29 DIAGNOSIS — Z87440 Personal history of urinary (tract) infections: Secondary | ICD-10-CM | POA: Diagnosis not present

## 2023-04-29 DIAGNOSIS — R350 Frequency of micturition: Secondary | ICD-10-CM | POA: Diagnosis not present

## 2023-04-29 LAB — POCT URINALYSIS DIPSTICK
Bilirubin, UA: NEGATIVE
Blood, UA: NEGATIVE
Glucose, UA: NEGATIVE
Ketones, UA: NEGATIVE
Nitrite, UA: NEGATIVE
Protein, UA: NEGATIVE
Spec Grav, UA: <=1.005 — AB (ref 1.010–1.025)
Urobilinogen, UA: 0.2 U/dL
pH, UA: 6.5 (ref 5.0–8.0)

## 2023-04-29 MED ORDER — ESTRADIOL 0.1 MG/GM VA CREA
0.5000 g | TOPICAL_CREAM | VAGINAL | 2 refills | Status: DC
Start: 2023-05-01 — End: 2023-12-15

## 2023-04-29 MED ORDER — GEMTESA 75 MG PO TABS
75.0000 mg | ORAL_TABLET | Freq: Every day | ORAL | 2 refills | Status: DC
Start: 2023-04-29 — End: 2023-07-01

## 2023-04-29 MED ORDER — GEMTESA 75 MG PO TABS
75.0000 mg | ORAL_TABLET | Freq: Every day | ORAL | Status: DC
Start: 2023-04-29 — End: 2023-09-05

## 2023-04-29 NOTE — Patient Instructions (Addendum)
For treatment of recurrent urinary tract infections, we discussed management of recurrent UTIs including prophylaxis with a daily low dose antibiotic, transvaginal estrogen therapy, D-mannose, and cranberry supplements.  We discussed the role of diagnostic testing such as cystoscopy and upper tract imaging.    - continue methenamine 1g twice daily, use with Vitamin C  - call if you experience any UTI symptoms for office urine testing  Start vaginal estrogen therapy nightly for two weeks then 2 times weekly at night for treatment of vaginal atrophy (dryness of the vaginal tissues).  Please let us know if the prescription is too expensive and we can look for alternative options.    We discussed the symptoms of overactive bladder (OAB), which include urinary urgency, urinary frequency, night-time urination, with or without urge incontinence.  We discussed management including behavioral therapy (decreasing bladder irritants by following a bladder diet, urge suppression strategies, timed voids, bladder retraining), physical therapy, medication; and for refractory cases posterior tibial nerve stimulation, sacral neuromodulation, and intravesical botulinum toxin injection.   For Beta-3 agonist medication, we discussed the potential side effect of elevated blood pressure which is more likely to occur in individuals with uncontrolled hypertension. You were given samples for Gemtesa 75 mg.  It can take a month to start working so give it time, but if you have bothersome side effects call sooner and we can try a different medication.  Call us if you have trouble filling the prescription or if it's not covered by your insurance.  For night time frequency: - avoid fluid intake after dinner - elevated your feet during the day or use compression socks to reduce lower extremity swelling - switch your diuretic (furosemide) dosing to 2pm

## 2023-04-29 NOTE — Progress Notes (Signed)
[facility] New Patient Evaluation and Consultation  Referring Provider: Judyann Munson, MD PCP: Kristian Covey, MD Date of Service: 04/29/2023  SUBJECTIVE Chief Complaint: New Patient (Initial Visit) (Referral from Dr Ilsa Iha for dysuria and recurrent UTI)  History of Present Illness: MEEKAH MATH is a 89 y.o. White or Caucasian female seen in consultation at the request of Dr. Drue Second for evaluation of dysuria.    History of recurrent UTI managed by Dr. Drue Second (ID) with methenamine 1g twice a day for 2 weeks. UTI symptoms described as dysuria and some increased incontinence. Denies hematuria, fever, chills.   Review of records significant for: History of HSV2 with left thigh lesion 11/28/22 Rx keflex 500mg  on 04/11/23 for presumed UTI. Ambulates with a cane with leg braces for foot drop since hospitalization for Wegener granulomatosis diagnosis in 2020 managed by rheumatology. History of kidney injury with Cr 1.17 on 04/23/23  Urinary Symptoms: Leaks urine with cough/ sneeze, laughing, exercise, going from sitting to standing, with a full bladder, with movement to the bathroom, with urgency, and continuously Leaks 4 time(s) per days with urgency, minimal stress urinary incontinence Pad use: 3-4 adult diapers per day.   Patient is not bothered by UI symptoms.  Day time voids >15.  Nocturia: 2-3 times per night to void. Voiding dysfunction:  empties bladder well.  Patient does not use a catheter to empty bladder.  When urinating, patient feels a weak stream, difficulty starting urine stream, dribbling after finishing, and the need to urinate multiple times in a row Drinks: 36oz water per day, 4 cups of coffee intake  UTIs:  5-6  UTI's in the last year.   Reports history of possible pyelonephritis while hospitalized around 2022, unclear per patient's husband Susceptibility data from last 90 days. Collected Specimen Info Organism AMOXICILLIN/CLAVULANATE AMPICILLIN  AMPICILLIN/SULBACTAM CEFAZOLIN CEFEPIME Ceftazidime CEFTRIAXONE Ciprofloxacin Gentamicin Susc lslt Imipenem LEVOFLOXACIN  03/03/23 Urine Escherichia coli  S  R  S  NR  S  S  S  S  S  S  S   Collected Specimen Info Organism Nitrofurantoin Susc lslt Piperacillin + Tazobactam Tobramycin Susc lslt Trimethoprim/Sulfa  03/03/23 Urine Escherichia coli  S  S  S  R    Pelvic Organ Prolapse Symptoms:                  Patient Denies a feeling of a bulge the vaginal area.   Bowel Symptom: Bowel movements: 4 time(s) per day with history of IBS, intermittent difficulty using the restroom for 2-3 months History of CMV gastritis Stool consistency: soft  Straining: yes.  Splinting: yes.  Incomplete evacuation: no.  Patient Denies accidental bowel leakage / fecal incontinence Bowel regimen: diet Last colonoscopy: Date 12/2018, Results peranal dermatitis due to HSV, internal hemorrhoids and polyps  Sexual Function Sexually active: no.  Sexual orientation: Straight Pain with sex: No  Pelvic Pain Denies pelvic pain   Past Medical History:  Past Medical History:  Diagnosis Date   Allergic rhinitis, cause unspecified    Allergy    Anxiety state, unspecified    Blood transfusion without reported diagnosis    2020   Cataract    removed both eyes    Chronic kidney disease    Disorder of bone and cartilage, unspecified    Esophageal candidiasis (HCC)    Foot drop    Gastric ulcer    History of CMV    History of Helicobacter pylori infection    Hypothyroid  Irritable bowel syndrome    Lumbago    Microscopic polyangiitis (HCC)    Other and unspecified hyperlipidemia    Pancreatic cyst    Perforation of tympanic membrane, unspecified    Unspecified essential hypertension      Past Surgical History:   Past Surgical History:  Procedure Laterality Date   ABDOMINAL HYSTERECTOMY     BIOPSY  01/02/2019   Procedure: BIOPSY;  Surgeon: Benancio Deeds, MD;  Location: [facility] ENDOSCOPY;   Service: Gastroenterology;;   cataract surgery  5/12 and 12/24/2012   both eyes   COLONOSCOPY     COLONOSCOPY WITH PROPOFOL N/A 01/02/2019   Procedure: COLONOSCOPY WITH PROPOFOL;  Surgeon: Benancio Deeds, MD;  Location: [facility] ENDOSCOPY;  Service: Gastroenterology;  Laterality: N/A;   ESOPHAGOGASTRODUODENOSCOPY (EGD) WITH PROPOFOL N/A 01/02/2019   Procedure: ESOPHAGOGASTRODUODENOSCOPY (EGD) WITH PROPOFOL;  Surgeon: Benancio Deeds, MD;  Location: [facility] ENDOSCOPY;  Service: Gastroenterology;  Laterality: N/A;   NSVD     x1   POLYPECTOMY  01/02/2019   Procedure: POLYPECTOMY;  Surgeon: Benancio Deeds, MD;  Location: [facility] ENDOSCOPY;  Service: Gastroenterology;;   POLYPECTOMY     TONSILLECTOMY     UPPER GASTROINTESTINAL ENDOSCOPY       Past OB/GYN History: G1P1001 Vaginal deliveries: 1,  Forceps/ Vacuum deliveries: 0, Cesarean section: 0 Menopausal: Yes, at age 54yo with hysterectomy  due to abnormal pap smear per patient Contraception: none. Last pap smear was at unknown time per patient, s/p hysterectomy.  Any history of abnormal pap smears: no. HM PAP     This patient has no relevant Health Maintenance data.       Medications: Patient has a current medication list which includes the following prescription(s): acetaminophen, amlodipine, atorvastatin, tavneos, cholecalciferol, esomeprazole, [START ON 05/01/2023] estradiol, furosemide, gabapentin, levothyroxine, methenamine, metoprolol succinate, centrum silver 50+women, silver sulfadiazine, tramadol, vascepa, gemtesa, gemtesa, and pfizer covid-19 vac bivalent.   Allergies: Patient is allergic to alendronate sodium, amoxicillin, cephalexin, codeine, and valsartan.   Social History:  Social History   Tobacco Use   Smoking status: Never   Smokeless tobacco: Never  Vaping Use   Vaping status: Never Used  Substance Use Topics   Alcohol use: No   Drug use: No    Relationship status: married Patient lives with her spouse.    Patient is not employed. Regular exercise: Yes: walking History of abuse: No  Family History:   Family History  Problem Relation Age of Onset   Lung cancer Mother 4   Lung disease Father 22   COPD Other        sibling   Colon cancer Neg Hx    Colon polyps Neg Hx    Esophageal cancer Neg Hx    Rectal cancer Neg Hx    Stomach cancer Neg Hx      Review of Systems: Review of Systems  Constitutional:  Negative for fever, malaise/fatigue and weight loss.  Respiratory:  Negative for cough, shortness of breath and wheezing.   Cardiovascular:  Positive for leg swelling. Negative for chest pain and palpitations.  Gastrointestinal:  Negative for abdominal pain and blood in stool.  Genitourinary:  Negative for hematuria.  Skin:  Positive for rash.  Neurological:  Negative for dizziness, weakness and headaches.  Endo/Heme/Allergies:  Does not bruise/bleed easily.  Psychiatric/Behavioral:  Negative for depression. The patient is not nervous/anxious.      OBJECTIVE Physical Exam: Vitals:   04/29/23 1056  BP: 134/81  Pulse: 92  Weight: 136 lb (  61.7 kg)  Height: 5' 1.02" (1.55 m)    Physical Exam Vitals reviewed. Exam conducted with a chaperone present.  Constitutional:      General: She is not in acute distress. Cardiovascular:     Rate and Rhythm: Normal rate.  Pulmonary:     Effort: Pulmonary effort is normal. No respiratory distress.  Abdominal:     General: There is no distension.     Palpations: Abdomen is soft.     Tenderness: There is no abdominal tenderness.    Genitourinary:    Labia:        Right: No rash, tenderness, lesion or injury.        Left: No rash, tenderness, lesion or injury.      Urethra: No prolapse, urethral pain, urethral swelling or urethral lesion.     Vagina: No signs of injury and foreign body. No vaginal discharge, erythema, tenderness, bleeding, lesions or prolapsed vaginal walls.     Uterus: Absent.      Adnexa:        Right: No mass,  tenderness or fullness.         Left: No mass, tenderness or fullness.    Neurological:     Mental Status: She is alert.      GU / Detailed Urogynecologic Evaluation:  Pelvic Exam: Normal external female genitalia; Bartholin's and Skene's glands normal in appearance; urethral meatus normal in appearance, no urethral masses or discharge.   CST: negative  Reflexes: bulbocavernosis not present, anocutaneous not present bilaterally.  s/p hysterectomy: Speculum exam reveals normal vaginal mucosa with  atrophy and normal vaginal cuff.   Pelvic floor strength V/V, puborectalis V/V   Pelvic floor musculature: Right levator tender, Right obturator non-tender, Left levator non-tender, Left obturator non-tender  POP-Q:   POP-Q  -3                                            Aa   -3                                           Ba  -7                                              C   1                                            Gh  4                                            Pb  7                                            tvl   -3  Ap  -3                                            Bp                                                 D    Post-Void Residual (PVR) by Bladder Scan: In order to evaluate bladder emptying, we discussed obtaining a postvoid residual and patient agreed to this procedure.  Procedure: The ultrasound unit was placed on the patient's abdomen in the suprapubic region after the patient had voided.    Post Void Residual - 04/29/23 1114       Post Void Residual   Post Void Residual 47 mL             Laboratory Results: Lab Results  Component Value Date   COLORU yellow 04/29/2023   CLARITYU Clear 04/29/2023   GLUCOSEUR Negative 04/29/2023   BILIRUBINUR Negative 04/29/2023   KETONESU Negative 04/29/2023   SPECGRAV <=1.005 (A) 04/29/2023   RBCUR Negative 04/29/2023   PHUR 6.5 04/29/2023   PROTEINUR  Negative 04/29/2023   UROBILINOGEN 0.2 04/29/2023   LEUKOCYTESUR Moderate (2+) (A) 04/29/2023    Lab Results  Component Value Date   CREATININE 1.17 05/22/2022   CREATININE 1.45 (H) 01/18/2021   CREATININE 1.43 (H) 03/24/2020    Lab Results  Component Value Date   HGBA1C 5.7 (H) 07/18/2020    Lab Results  Component Value Date   HGB 14.3 04/08/2023     ASSESSMENT AND PLAN Ms. Cumpian is a 89 y.o. with:   Leukocytes in urine Assessment & Plan: - pending clean catch urine culture - continue methenamine  - pt to call for repeat testing with catheterized sample to minimize contamination if clinical change - recently treated on 04/11/23 with Keflex - no hematuria, back pain, fevers, chills  Orders: -     Urine Culture; Future  Urge urinary incontinence Assessment & Plan: We discussed the symptoms of overactive bladder (OAB), which include urinary urgency, urinary frequency, nocturia, with or without urge incontinence.  While we do not know the exact etiology of OAB, several treatment options exist. We discussed management including behavioral therapy (decreasing bladder irritants, urge suppression strategies, timed voids, bladder retraining), physical therapy, medication; for refractory cases posterior tibial nerve stimulation, sacral neuromodulation, and intravesical botulinum toxin injection.  For anticholinergic medications, we discussed the potential side effects of anticholinergics including dry eyes, dry mouth, constipation, cognitive impairment and urinary retention. For Beta-3 agonist medication, we discussed the potential side effect of elevated blood pressure which is more likely to occur in individuals with uncontrolled hypertension. - pt desires trial of medications, samples of Gemtesa provided - reduce caffeine intake - referral for pelvic floor PT sent - reviewed bladder training with handout and instructions provided - consider functional causes of incontinence  due to gait disturbance and cane use  Orders: -     POCT urinalysis dipstick -     AMB referral to rehabilitation  History of recurrent UTI (urinary tract infection) Assessment & Plan: - evaluated by Dr. Drue Second (ID) and started methenamine, continue with Vit C use - For treatment of recurrent urinary tract infections, we discussed management of recurrent UTIs including  prophylaxis with a daily low dose antibiotic, transvaginal estrogen therapy, D-mannose, and cranberry supplements.  We discussed the role of diagnostic testing such as cystoscopy and upper tract imaging.   - provided cranberry extract samples  - encouraged probiotics - Rx to start vaginal estrogen. We discussed the potential risks associated with hormone replacement including stroke, heart attack, and blood clots; and the fact that these risks are very low with vaginal estrogen use due to the very low systemic absorption rate of ~ 0.01% with a twice-week regimen. - catheterized urine testing if clinical change to minimize contamination  Orders: -     Estradiol; Place 0.5 g vaginally 2 (two) times a week. Place 0.5g nightly for two weeks then twice a week after  Dispense: 30 g; Refill: 2  Nocturia Assessment & Plan: - limited fluid intake after dinner - continue elevating legs during daytime to increase venous return - switch lasix to 2pm dosing - denies snoring or history of OSA - trial of Gemtesa  Orders: -     AMB referral to rehabilitation  Other orders -     Gemtesa; Take 1 tablet (75 mg total) by mouth daily.  Dispense: 30 tablet; Refill: 2 -     Gemtesa; Take 1 tablet (75 mg total) by mouth daily.   Time spent: I spent 70 minutes dedicated to the care of this patient on the date of this encounter to include pre-visit review of records, face-to-face time with the patient discussing rUTI, UUI, and nocturia treatment and post visit documentation and ordering medication/ testing.   Loleta Chance, MD

## 2023-04-29 NOTE — Assessment & Plan Note (Signed)
-   limited fluid intake after dinner - continue elevating legs during daytime to increase venous return - switch lasix to 2pm dosing - denies snoring or history of OSA - trial of [place]

## 2023-04-29 NOTE — Assessment & Plan Note (Signed)
We discussed the symptoms of overactive bladder (OAB), which include urinary urgency, urinary frequency, nocturia, with or without urge incontinence.  While we do not know the exact etiology of OAB, several treatment options exist. We discussed management including behavioral therapy (decreasing bladder irritants, urge suppression strategies, timed voids, bladder retraining), physical therapy, medication; for refractory cases posterior tibial nerve stimulation, sacral neuromodulation, and intravesical botulinum toxin injection.  For anticholinergic medications, we discussed the potential side effects of anticholinergics including dry eyes, dry mouth, constipation, cognitive impairment and urinary retention. For Beta-3 agonist medication, we discussed the potential side effect of elevated blood pressure which is more likely to occur in individuals with uncontrolled hypertension. - pt desires trial of medications, samples of Gemtesa provided - reduce caffeine intake - referral for pelvic floor PT sent - reviewed bladder training with handout and instructions provided - consider functional causes of incontinence due to gait disturbance and cane use

## 2023-04-29 NOTE — Assessment & Plan Note (Signed)
-   evaluated by Dr. Drue Second (ID) and started methenamine, continue with Vit C use - For treatment of recurrent urinary tract infections, we discussed management of recurrent UTIs including prophylaxis with a daily low dose antibiotic, transvaginal estrogen therapy, D-mannose, and cranberry supplements.  We discussed the role of diagnostic testing such as cystoscopy and upper tract imaging.   - provided cranberry extract samples  - encouraged probiotics - Rx to start vaginal estrogen. We discussed the potential risks associated with hormone replacement including stroke, heart attack, and blood clots; and the fact that these risks are very low with vaginal estrogen use due to the very low systemic absorption rate of ~ 0.01% with a twice-week regimen. - catheterized urine testing if clinical change to minimize contamination

## 2023-04-29 NOTE — Assessment & Plan Note (Signed)
-   pending clean catch urine culture - continue methenamine  - pt to call for repeat testing with catheterized sample to minimize contamination if clinical change - recently treated on 04/11/23 with Keflex - no hematuria, back pain, fevers, chills

## 2023-04-30 ENCOUNTER — Telehealth: Payer: Self-pay | Admitting: Family Medicine

## 2023-04-30 NOTE — Telephone Encounter (Signed)
Pt husband would like to if pt should get rsv and new covid vaccine

## 2023-04-30 NOTE — Telephone Encounter (Signed)
Patient's husband Rosanne Ashing informed of message below and voiced understanding

## 2023-05-02 LAB — URINE CULTURE: Culture: >=100000 — AB

## 2023-05-07 ENCOUNTER — Ambulatory Visit: Payer: Medicare Other | Admitting: Obstetrics

## 2023-05-09 ENCOUNTER — Other Ambulatory Visit (HOSPITAL_BASED_OUTPATIENT_CLINIC_OR_DEPARTMENT_OTHER): Payer: Self-pay

## 2023-05-09 MED ORDER — COVID-19 MRNA VAC-TRIS(PFIZER) 30 MCG/0.3ML IM SUSY
0.3000 mL | PREFILLED_SYRINGE | Freq: Once | INTRAMUSCULAR | 0 refills | Status: AC
  Filled 2023-05-09: qty 0.3, 1d supply, fill #0

## 2023-05-13 ENCOUNTER — Encounter: Payer: Self-pay | Admitting: Internal Medicine

## 2023-05-13 ENCOUNTER — Other Ambulatory Visit: Payer: Self-pay

## 2023-05-13 ENCOUNTER — Ambulatory Visit (INDEPENDENT_AMBULATORY_CARE_PROVIDER_SITE_OTHER): Payer: Medicare Other | Admitting: Internal Medicine

## 2023-05-13 VITALS — BP 135/78 | HR 86 | Resp 16 | Ht 61.02 in | Wt 137.0 lb

## 2023-05-13 DIAGNOSIS — N390 Urinary tract infection, site not specified: Secondary | ICD-10-CM

## 2023-05-13 DIAGNOSIS — N3281 Overactive bladder: Secondary | ICD-10-CM

## 2023-05-13 DIAGNOSIS — R300 Dysuria: Secondary | ICD-10-CM

## 2023-05-13 NOTE — Progress Notes (Unsigned)
Patient ID: Emily Cannon, female   DOB: 11-09-1932, 89 y.o.   MRN: [id number]  HPI Donna is an 89yo F with htn, recurrent uti, chronic sensation of dysuria, she has been tolerating methanimine in addition was able to be seen by  urogynecologist Feeling alittle better. Initially she had difficulty with applicator for vaginal estrogen which caused her to have bleeding for irritation from applicator. Now improved with doing it manually. No flank pain and dysuria  Outpatient Encounter Medications as of 05/13/2023  Medication Sig   acetaminophen (TYLENOL) 500 MG tablet Take 500 mg by mouth every 6 (six) hours as needed.   amLODipine (NORVASC) 5 MG tablet Take 5 mg by mouth daily.   atorvastatin (LIPITOR) 20 MG tablet TAKE 1 TABLET BY MOUTH DAILY   Avacopan (TAVNEOS) 10 MG CAPS Take 2 capsules by mouth twice daily with meals.   Cholecalciferol 25 MCG (1000 UT) tablet Take 1 capsule by mouth daily.   esomeprazole (NEXIUM) 40 MG capsule Take 1 capsule (40 mg total) by mouth daily. Please keep your November appointment for further refills.   furosemide (LASIX) 40 MG tablet Take 0.5 tablets (20 mg total) by mouth 3 (three) times a week. Monday, Wed, Friday (Patient taking differently: Take 40 mg by mouth every Monday, Wednesday, and Friday.)   gabapentin (NEURONTIN) 100 MG capsule Take 2 capsules by mouth at night for neuropathy pain (Patient taking differently: 100 mg 2 (two) times daily. Take 2 capsules by mouth at night for neuropathy pain)   levothyroxine (SYNTHROID) 50 MCG tablet TAKE ONE TABLET BY MOUTH EVERY MORNING BEFORE BREAKFAST   methenamine (HIPREX) 1 g tablet Take 1 tablet (1 g total) by mouth 2 (two) times daily with a meal.   metoprolol succinate (TOPROL-XL) 50 MG 24 hr tablet TAKE 1 TABLET BY MOUTH 2 TIMES A DAY WITH OR IMMEDIATELY FOLLOWING A MEAL   Multiple Vitamins-Minerals (CENTRUM SILVER 50+WOMEN) TABS Take 1 tablet by mouth daily with breakfast.   silver sulfADIAZINE  (SILVADENE) 1 % cream Apply 1 Application topically daily.   traMADol (ULTRAM) 50 MG tablet TAKE ONE TABLET BY MOUTH EVERY 6 HOURS AS NEEDED FOR UP TO 5 DAYS   VASCEPA 1 g capsule TAKE 2 CAPSULES BY MOUTH TWICE A DAY   Vibegron (GEMTESA) 75 MG TABS Take 1 tablet (75 mg total) by mouth daily.   Vibegron (GEMTESA) 75 MG TABS Take 1 tablet (75 mg total) by mouth daily.   estradiol (ESTRACE) 0.1 MG/GM vaginal cream Place 0.5 g vaginally 2 (two) times a week. Place 0.5g nightly for two weeks then twice a week after (Patient not taking: Reported on 05/13/2023)   [DISCONTINUED] COVID-19 mRNA bivalent vaccine, Pfizer, (PFIZER COVID-19 VAC BIVALENT) injection Inject into the muscle. (Patient not taking: Reported on 04/08/2023)   No facility-administered encounter medications on file as of 05/13/2023.     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) 09/25/2020   Peripheral neuropathy 05/08/2020   Weakness 03/28/2020   Gait abnormality 03/28/2020   Paresthesia 03/28/2020   Left foot drop 03/23/2020   Heme positive stool    Benign neoplasm of colon    Acute blood loss anemia 12/30/2018   Symptomatic anemia 12/15/2018   AKI (acute kidney injury) (HCC)  12/15/2018   Hypomagnesemia 12/15/2018   Steroid-induced hyperglycemia 12/15/2018   Microscopic polyangiitis (HCC) 11/13/2018   Positive P-ANCA titer 10/14/2018   Viral URI 05/12/2018   Syncope 06/05/2016   Venous (peripheral) insufficiency 11/23/2012   Dermatitis 11/10/2012   Hypothyroidism 05/22/2011   Perforation of tympanic membrane 08/08/2007   Osteopenia 07/27/2007   Hyperlipidemia 05/11/2007   Anxiety state 05/11/2007   Essential hypertension 05/11/2007   Allergic rhinitis 05/11/2007    Irritable bowel syndrome 05/11/2007   LOW BACK PAIN 05/11/2007   Acquired absence of genital organ 05/11/2007     Health Maintenance Due  Topic Date Due   INFLUENZA VACCINE  02/27/2023     Review of Systems 12 point ros is negative Physical Exam   BP 135/78   Pulse 86   Resp 16   Ht 5' 1.02" (1.55 m)   Wt 137 lb (62.1 kg)   SpO2 95%   BMI 25.87 kg/m    Physical Exam  Constitutional:  oriented to person, place, and time. appears well-developed and well-nourished. No distress.  HENT: [place]/AT, PERRLA, no scleral icterus Mouth/Throat: Oropharynx is clear and moist. No oropharyngeal exudate.  Cardiovascular: Normal rate, regular rhythm and normal heart sounds. Exam reveals no gallop and no friction rub.  No murmur heard.  Pulmonary/Chest: Effort normal and breath sounds normal. No respiratory distress.  has no wheezes.  Neck = supple, no nuchal rigidity Neurological: alert and oriented to person, place, and time.  Skin: Skin is warm and dry. No rash noted. No erythema.  Psychiatric: a normal mood and affect.  behavior is normal.   Lab Results  Component Value Date   LABRPR Non Reactive 07/18/2020    CBC Lab Results  Component Value Date   WBC 5.6 04/08/2023   RBC 4.53 04/08/2023   HGB 14.3 04/08/2023   HCT 40.8 04/08/2023   PLT 169 04/08/2023   MCV 90.1 04/08/2023   MCH 31.6 04/08/2023   MCHC 35.0 04/08/2023   RDW 13.6 04/08/2023   LYMPHSABS 2,257 04/08/2023   MONOABS 0.8 08/30/2022   EOSABS 190 04/08/2023    BMET Lab Results  Component Value Date   NA 139 05/22/2022   K 4.1 05/22/2022   CL 102 05/22/2022   CO2 29 05/22/2022   GLUCOSE 110 (H) 05/22/2022   BUN 15 05/22/2022   CREATININE 1.17 05/22/2022   CALCIUM 9.5 05/22/2022   GFRNONAA 20 (L) 01/03/2019   GFRAA 23 (L) 01/03/2019   basic metabolic panel Lab Results  Component Value Date   CREATININE 1.16 (H) 05/13/2023      Assessment and Plan Recurrent uti = continue on methenamine for the  time being to help prevent uti.continue with estrogen cream  Long term medication management, and monitoring = Will check cr function-- appears stable  I have personally spent 30 minutes involved in face-to-face and non-face-to-face activities for this patient on the day of the visit. Professional time spent includes the following activities: Preparing to see the patient (review of tests), Obtaining and/or reviewing separately obtained history (admission/discharge record), Performing a medically appropriate examination and/or evaluation , Ordering medications/tests/procedures, referring and communicating with other health care professionals, Documenting clinical information in the EMR, Independently interpreting results (not separately reported), Communicating results to the patient/family/caregiver, Counseling and educating the patient/family/caregiver and Care coordination (not separately reported).

## 2023-05-14 LAB — BASIC METABOLIC PANEL
BUN/Creatinine Ratio: 14 (calc) (ref 6–22)
BUN: 16 mg/dL (ref 7–25)
CO2: 26 mmol/L (ref 20–32)
Calcium: 9.4 mg/dL (ref 8.6–10.4)
Chloride: 104 mmol/L (ref 98–110)
Creat: 1.16 mg/dL — ABNORMAL HIGH (ref 0.60–0.95)
Glucose, Bld: 96 mg/dL (ref 65–99)
Potassium: 4.5 mmol/L (ref 3.5–5.3)
Sodium: 140 mmol/L (ref 135–146)

## 2023-05-20 ENCOUNTER — Other Ambulatory Visit: Payer: Self-pay | Admitting: Family Medicine

## 2023-05-28 ENCOUNTER — Ambulatory Visit (INDEPENDENT_AMBULATORY_CARE_PROVIDER_SITE_OTHER): Payer: Medicare Other | Admitting: Podiatry

## 2023-05-28 ENCOUNTER — Encounter: Payer: Self-pay | Admitting: Podiatry

## 2023-05-28 DIAGNOSIS — M779 Enthesopathy, unspecified: Secondary | ICD-10-CM

## 2023-05-28 DIAGNOSIS — M79675 Pain in left toe(s): Secondary | ICD-10-CM | POA: Diagnosis not present

## 2023-05-28 DIAGNOSIS — M79674 Pain in right toe(s): Secondary | ICD-10-CM | POA: Diagnosis not present

## 2023-05-28 DIAGNOSIS — B351 Tinea unguium: Secondary | ICD-10-CM

## 2023-05-29 NOTE — Progress Notes (Signed)
Subjective:   Patient ID: Karl Pock, female   DOB: 89 y.o.   MRN: [id number]   HPI Patient presents thick yellow brittle nailbeds 1-5 both feet incurvated and gets sore   ROS      Objective:  Physical Exam  Neurovascular status intact thick yellow incurvated nailbeds 1-5 both feet hard for them to cut     Assessment:  Chronic mycotic nail infection with pain 1-5 both the     Plan:  Debridement nailbeds 1-5 both feet no intra genic bleeding reappoint routine care

## 2023-06-04 ENCOUNTER — Other Ambulatory Visit (HOSPITAL_BASED_OUTPATIENT_CLINIC_OR_DEPARTMENT_OTHER): Payer: Self-pay

## 2023-06-04 MED ORDER — AREXVY 120 MCG/0.5ML IM SUSR
0.5000 mL | Freq: Once | INTRAMUSCULAR | 0 refills | Status: AC
  Filled 2023-06-04: qty 0.5, 1d supply, fill #0

## 2023-06-17 ENCOUNTER — Ambulatory Visit (INDEPENDENT_AMBULATORY_CARE_PROVIDER_SITE_OTHER): Payer: Medicare Other | Admitting: Nurse Practitioner

## 2023-06-17 ENCOUNTER — Encounter: Payer: Self-pay | Admitting: Nurse Practitioner

## 2023-06-17 VITALS — BP 120/70 | HR 90 | Ht 61.02 in | Wt 134.0 lb

## 2023-06-17 DIAGNOSIS — R194 Change in bowel habit: Secondary | ICD-10-CM | POA: Diagnosis not present

## 2023-06-17 DIAGNOSIS — Z8619 Personal history of other infectious and parasitic diseases: Secondary | ICD-10-CM | POA: Diagnosis not present

## 2023-06-17 DIAGNOSIS — K219 Gastro-esophageal reflux disease without esophagitis: Secondary | ICD-10-CM

## 2023-06-17 DIAGNOSIS — K862 Cyst of pancreas: Secondary | ICD-10-CM

## 2023-06-17 DIAGNOSIS — K5900 Constipation, unspecified: Secondary | ICD-10-CM

## 2023-06-17 DIAGNOSIS — Z8711 Personal history of peptic ulcer disease: Secondary | ICD-10-CM

## 2023-06-17 NOTE — Progress Notes (Unsigned)
06/17/2023 Emily BODOH [id number] 04/13/1933   Chief Complaint:  History of Present Illness:  a history of chronic rheumatic disease/microscopic polyangiitis, previously on rituximab / high dose prednisone, history of CMV gastritis and gastric ulcer, history of H. pylori and symptomatic anemia, here for follow-up visit for pancreatic cyst and altered bowel habits. She is known by Dr. Adela Lank.   [facility] and infusion [place]  Infusion Q 6 mnths, due 12/2.   She denies having any heartburn on Esomeprazole 40mg  every day. Sometimes potato chips difficlty swallowing. No upper or lower abdominal pain. She passes a BM 4 times in one day then no BM. No blood with BM. No blood.   Recall she previously had an MRI of her T-spine which showed incidental noting of cyst in her pancreas.  She had a follow-up CT scan by her primary care in October 2021 with results as outlined below.  She has a greater than 3 cm cystic lesion in her pancreas.  I saw her about a year ago and we coordinated a follow-up MRCP of her pancreas this past October 2022 after discussion with my advanced endoscopy colleagues about EUS versus MRCP.  Follow-up MRCP is as outlined below, essentially no significant change in largest cystic lesion of the pancreas, with some smaller cysts also incidentally noted.  She denies any epigastric pain or weight loss since of last seen her.  No family history of pancreatic cancer.  We discussed how aggressive she want to be with further surveillance given her age.   EGD 04/29/19 -  - Diffuse, white and yellow plaques were found in the upper third of the esophagus and in the middle third of the esophagus consistent with esophageal candidiasis. - The exam of the esophagus was otherwise normal. - Patchy inflammation characterized by adherent heme, erythema and granularity was found in the gastric antrum. A focal erosion was noted at the site of the prior ulcer - ulcer  has healed other than small erosion. - The exam of the stomach was otherwise normal. - Biopsies were taken with a cold forceps in the gastric body, at the incisura and in the gastric antrum for histology - rule out CMV / H pylori. - The duodenal bulb and second portion of the duodenum were normal.   Biopsies positive for H pylori, negative for CMV - treated with Pylera with eradication testing negative   EGD 01/02/19 Esophagogastric landmarks identified. - Esophageal plaques were found, consistent with candidiasis. - Non-bleeding gastric ulcer with no stigmata of bleeding - biopsies c/w CMV gastritis - Normal duodenal bulb and second portion of the duodenum. - Biopsies were taken with a cold forceps for Helicobacter pylori testing.   Colonoscopy 01/02/19 - Perianal dermatitis found on perianal exam. - The examined portion of the ileum was normal. - Two 3 mm polyps in the sigmoid colon, removed with a cold biopsy forceps. Resected and retrieved. - Internal hemorrhoids with inflammatory changes. - Several minutes spent lavaging the colon to achieve adequate views. - The examination was otherwise normal. Inflamed internal hemorrhoids are causing rectal bleeding, I suspect ulcer on EGD may be more likely the cause of worsening anemia with component of hemorrhoidal bleeding.   H pylori eradication testing negative 07/22/19     CT scan 04/28/20 - IMPRESSION: Two multiloculated pancreatic cysts measuring up to 3.1 cm in the pancreatic head/uncinate process. No high risk imaging features. As such, in a patient of this age, follow-up CT or MRI abdomen with/without  contrast is suggested in 2 years, as clinically Warranted.   MRCP 05/23/21 - IMPRESSION: There is a thinly septated, multiloculated cystic lesion of the superior pancreatic head, measuring 3.1 x 1.5 cm, unchanged. Additional smaller lesions are present in the pancreatic head, uncinate, and body, measuring up to 1.4 x 1.0 cm in the  uncinate, and 0.9 cm in the body, likewise unchanged. No associated contrast enhancement. No pancreatic ductal dilatation. These may reflect pancreatic pseudocysts, IPMNs, or serous cystic neoplasm. Consider follow-up MRI at 2 years to ensure ongoing stability, if clinically appropriate. This recommendation follows ACR consensus guidelines: Management of Incidental Pancreatic Cysts: A White Paper of the ACR Incidental Findings Committee. J Am Coll Radiol 2017;14:911-923.      Current Medications, Allergies, Past Medical History, Past Surgical History, Family History and Social History were reviewed in [employer] record.   Review of Systems:   Constitutional: Negative for fever, sweats, chills or weight loss.  Respiratory: Negative for shortness of breath.   Cardiovascular: Negative for chest pain, palpitations and leg swelling.  Gastrointestinal: See HPI.  Musculoskeletal: Negative for back pain or muscle aches.  Neurological: Negative for dizziness, headaches or paresthesias.    Physical Exam: BP 120/70 (BP Location: Left Arm, Patient Position: Sitting, Cuff Size: Normal)   Pulse 90   Ht 5' 1.02" (1.55 m)   Wt 134 lb (60.8 kg)   SpO2 94%   BMI 25.30 kg/m  General: in no acute distress. Head: Normocephalic and atraumatic. Eyes: No scleral icterus. Conjunctiva pink . Ears: Normal auditory acuity. Mouth: Dentition intact. No ulcers or lesions.  Lungs: Clear throughout to auscultation. Heart: Regular rate and rhythm, no murmur. Abdomen: Soft, nontender and nondistended. No masses or hepatomegaly. Normal bowel sounds x 4 quadrants.  Rectal: Deferred.  Musculoskeletal: Symmetrical with no gross deformities. Extremities: No edema. Neurological: Alert oriented x 4. No focal deficits.  Psychological: Alert and cooperative. Normal mood and affect  Assessment and Recommendations:

## 2023-06-17 NOTE — Patient Instructions (Signed)
A high fiber diet with plenty of fluids (up to 8 glasses of water daily) is suggested to relieve these symptoms.  Benefiber- 1 tablespoon once  daily as tolerated.   Use Miralax 1 capful ( 17 g) at bedtime as needed.   _______________________________________________________  If your blood pressure at your visit was 140/90 or greater, please contact your primary care physician to follow up on this.  _______________________________________________________  If you are age 89 or older, your body mass index should be between 23-30. Your Body mass index is 25.3 kg/m. If this is out of the aforementioned range listed, please consider follow up with your Primary Care Provider.  If you are age 65 or younger, your body mass index should be between 19-25. Your Body mass index is 25.3 kg/m. If this is out of the aformentioned range listed, please consider follow up with your Primary Care Provider.   ________________________________________________________  The [facility] providers would like to encourage you to use [facility] to communicate with providers for non-urgent requests or questions.  Due to long hold times on the telephone, sending your provider a message by [facility] may be a faster and more efficient way to get a response.  Please allow 48 business hours for a response.  Please remember that this is for non-urgent requests.  _______________________________________________________  Thank you for choosing me and [facility].  Noel Gerold- Smith,CRNP

## 2023-06-18 NOTE — Progress Notes (Signed)
Agree with assessment and plan as outlined.  As long as patient understands risks of this cyst growing and potentially turning into pancreatic cancer.  If she would not want surgery if for pancreatic cancer should that develop, which would be a major ordeal for her to undergo, then reasonable to stop further surveillance.  At her age and other medical problems I understand if she would not want to pursue further surveillance.  If she changes her mind, she should let us know.  Thanks

## 2023-06-19 NOTE — Progress Notes (Signed)
Dr. Adela Lank, I spoke to the patient's husband as she was busy getting her hair done and did not wish to take the call. I discussed your input in the addendum below to her husband and he stated that he did not want [place] to discuss further imaging of the pancreas to his wife and they decided not to pursue any further monitoring. He did not want me to inform his wife that the pancreatic cysts potentially could turn into a cancer and he doesn't think she could handle that and it would cause her distress. He stated his wife has mild dementia, she still makes her own medical decisions.He will contact our office if his wife had any N/V/ abdominal pain or weight loss.

## 2023-07-01 ENCOUNTER — Encounter: Payer: Self-pay | Admitting: Family Medicine

## 2023-07-01 ENCOUNTER — Ambulatory Visit (INDEPENDENT_AMBULATORY_CARE_PROVIDER_SITE_OTHER): Payer: Medicare Other | Admitting: Family Medicine

## 2023-07-01 VITALS — BP 136/70 | HR 84 | Temp 97.9°F | Ht 61.0 in | Wt 136.7 lb

## 2023-07-01 DIAGNOSIS — Z23 Encounter for immunization: Secondary | ICD-10-CM

## 2023-07-01 DIAGNOSIS — E785 Hyperlipidemia, unspecified: Secondary | ICD-10-CM | POA: Diagnosis not present

## 2023-07-01 DIAGNOSIS — E039 Hypothyroidism, unspecified: Secondary | ICD-10-CM | POA: Diagnosis not present

## 2023-07-01 DIAGNOSIS — E7800 Pure hypercholesterolemia, unspecified: Secondary | ICD-10-CM

## 2023-07-01 DIAGNOSIS — I10 Essential (primary) hypertension: Secondary | ICD-10-CM

## 2023-07-01 DIAGNOSIS — R300 Dysuria: Secondary | ICD-10-CM | POA: Diagnosis not present

## 2023-07-01 NOTE — Progress Notes (Signed)
Established Patient Office Visit  Subjective   Patient ID: Emily Cannon, female    DOB: 12-13-1932  Age: 89 y.o. MRN: [id number]  No chief complaint on file.   HPI   Emily Cannon has past medical history significant for hypertension, microscopic polyangiitis, hypothyroidism, peripheral neuropathy, osteopenia, hyperlipidemia.  She was diagnosed with microscopic polyangiitis back in 2020 and at 1 point was critically ill and had renal failure and multiple other manifestations including severe vasculitis.  She has stabilized remarkably well and had recent renal function which was greatly improved.  No recent rash.  Followed actively by multiple specialists including rheumatology, gastroenterology, podiatry, and infectious disease.  She has had multiple UTIs.  Currently on methenamine for prevention.  She is supposed to be on estrogen cream but had burning after 1 application and stopped.  She has atrophic vaginitis as a risk factor.  She has history of urine urgency currently on Gemtesa 75 mg daily.  For lipids she takes Vascepa 2 capsules twice daily and atorvastatin 1 mg daily.  Lipids were checked 5 months ago total cholesterol 159 and triglycerides 406.  Generally feels well.  Does have some mild burning with urination currently but this is more or less chronic.  No recent fever reported.  She states she has excellent appetite.  Past Medical History:  Diagnosis Date   Allergic rhinitis, cause unspecified    Allergy    Anxiety state, unspecified    Blood transfusion without reported diagnosis    2020   Cataract    removed both eyes    Chronic kidney disease    Disorder of bone and cartilage, unspecified    Esophageal candidiasis (HCC)    Foot drop    Gastric ulcer    History of CMV    History of Helicobacter pylori infection    Hypothyroid    Irritable bowel syndrome    Lumbago    Microscopic polyangiitis (HCC)    Other and unspecified hyperlipidemia    Pancreatic cyst     Perforation of tympanic membrane, unspecified    Unspecified essential hypertension    Past Surgical History:  Procedure Laterality Date   ABDOMINAL HYSTERECTOMY     BIOPSY  01/02/2019   Procedure: BIOPSY;  Surgeon: Benancio Deeds, MD;  Location: [facility] ENDOSCOPY;  Service: Gastroenterology;;   cataract surgery  5/12 and 12/24/2012   both eyes   COLONOSCOPY     COLONOSCOPY WITH PROPOFOL N/A 01/02/2019   Procedure: COLONOSCOPY WITH PROPOFOL;  Surgeon: Benancio Deeds, MD;  Location: [facility] ENDOSCOPY;  Service: Gastroenterology;  Laterality: N/A;   ESOPHAGOGASTRODUODENOSCOPY (EGD) WITH PROPOFOL N/A 01/02/2019   Procedure: ESOPHAGOGASTRODUODENOSCOPY (EGD) WITH PROPOFOL;  Surgeon: Benancio Deeds, MD;  Location: [facility] ENDOSCOPY;  Service: Gastroenterology;  Laterality: N/A;   NSVD     x1   POLYPECTOMY  01/02/2019   Procedure: POLYPECTOMY;  Surgeon: Benancio Deeds, MD;  Location: [facility] ENDOSCOPY;  Service: Gastroenterology;;   POLYPECTOMY     TONSILLECTOMY     UPPER GASTROINTESTINAL ENDOSCOPY      reports that she has never smoked. She has never used smokeless tobacco. She reports that she does not drink alcohol and does not use drugs. family history includes COPD in an other family member; Lung cancer (age of onset: 74) in her mother; Lung disease (age of onset: 74) in her father. Allergies  Allergen Reactions   Alendronate Sodium Shortness Of Breath   Amoxicillin Diarrhea    Severe diarrhea   Cephalexin Diarrhea,  Nausea Only and Other (See Comments)    Abdominal cramps   Codeine Nausea Only   Valsartan Swelling    Gums/throat swell     Review of Systems  Constitutional:  Negative for malaise/fatigue.  Eyes:  Negative for blurred vision.  Respiratory:  Negative for shortness of breath.   Cardiovascular:  Negative for chest pain.  Neurological:  Negative for dizziness, weakness and headaches.      Objective:     BP 136/70 (BP Location: Left Arm, Patient Position: Sitting,  Cuff Size: Normal)   Pulse 84   Temp 97.9 F (36.6 C) (Oral)   Ht 5\' 1"  (1.549 m)   Wt 136 lb 11.2 oz (62 kg)   SpO2 96%   BMI 25.83 kg/m  BP Readings from Last 3 Encounters:  07/01/23 136/70  06/17/23 120/70  05/13/23 135/78   Wt Readings from Last 3 Encounters:  07/01/23 136 lb 11.2 oz (62 kg)  06/17/23 134 lb (60.8 kg)  05/13/23 137 lb (62.1 kg)      Physical Exam Vitals reviewed.  Constitutional:      General: She is not in acute distress.    Appearance: She is well-developed.  Neck:     Thyroid: No thyromegaly.     Vascular: No JVD.  Cardiovascular:     Rate and Rhythm: Normal rate and regular rhythm.     Heart sounds:     No gallop.  Pulmonary:     Effort: Pulmonary effort is normal. No respiratory distress.     Breath sounds: Normal breath sounds. No wheezing or rales.  Musculoskeletal:     Cervical back: Neck supple.     Right lower leg: No edema.     Left lower leg: No edema.     Comments: Bilateral AFO braces  Neurological:     General: No focal deficit present.     Mental Status: She is alert.      No results found for any visits on 07/01/23.  Last CBC Lab Results  Component Value Date   WBC 5.6 04/08/2023   HGB 14.3 04/08/2023   HCT 40.8 04/08/2023   MCV 90.1 04/08/2023   MCH 31.6 04/08/2023   RDW 13.6 04/08/2023   PLT 169 04/08/2023   Last metabolic panel Lab Results  Component Value Date   GLUCOSE 96 05/13/2023   NA 140 05/13/2023   K 4.5 05/13/2023   CL 104 05/13/2023   CO2 26 05/13/2023   BUN 16 05/13/2023   CREATININE 1.16 (H) 05/13/2023   GFR 42.12 (L) 05/22/2022   CALCIUM 9.4 05/13/2023   PHOS 2.9 01/03/2019   PROT 6.2 05/22/2022   ALBUMIN 4.4 05/22/2022   LABGLOB 2.5 07/18/2020   BILITOT 0.4 05/22/2022   ALKPHOS 86 05/22/2022   AST 19 05/22/2022   ALT 18 05/22/2022   ANIONGAP 7 01/03/2019   Last lipids Lab Results  Component Value Date   CHOL 159 01/14/2023   HDL 37.30 (L) 01/14/2023   LDLCALC 65 09/11/2018    LDLDIRECT 46.0 01/14/2023   TRIG (H) 01/14/2023    406.0 Triglyceride is over 400; calculations on Lipids are invalid.   CHOLHDL 4 01/14/2023   Last thyroid functions Lab Results  Component Value Date   TSH 1.70 01/14/2023      The ASCVD Risk score (Arnett DK, et al., 2019) failed to calculate for the following reasons:   The 2019 ASCVD risk score is only valid for ages 64 to 57    Assessment &  Plan:   #1 dysuria.  Patient has history of recurrent UTI.  Atrophic vaginitis.  Urinalysis ordered but patient was unable to give specimen.  She will try again tomorrow.  Follow-up immediately for any fever or worsening symptoms -We did stress to her to try to get back on estrogen topical cream  #2 dyslipidemia.  She is on atorvastatin and Vascepa.  Triglycerides over 400 last visit.  Future order for fasting lipids in about a month.  Discussed dietary management of hypertriglyceridemia.  Try to scale back high glycemic foods and increase omega-3's  #3 hypothyroidism.  She is on low-dose thyroid replacement.  Recent TSH at goal.  Recheck in about 6 months  #4 hypertension stable and at goal on amlodipine 5 mg daily.  Continue close monitoring  Return in about 3 months (around 09/29/2023).    Evelena Peat, MD

## 2023-07-01 NOTE — Patient Instructions (Signed)
Try to get back on the Premarin vaginal cream.   Set up fasting labs for about one month.

## 2023-07-02 ENCOUNTER — Other Ambulatory Visit: Payer: Self-pay | Admitting: Gastroenterology

## 2023-07-03 ENCOUNTER — Other Ambulatory Visit: Payer: Self-pay | Admitting: Family Medicine

## 2023-07-05 ENCOUNTER — Other Ambulatory Visit: Payer: Self-pay | Admitting: Family Medicine

## 2023-07-10 ENCOUNTER — Other Ambulatory Visit: Payer: Self-pay | Admitting: Family Medicine

## 2023-07-10 DIAGNOSIS — I10 Essential (primary) hypertension: Secondary | ICD-10-CM

## 2023-07-16 ENCOUNTER — Other Ambulatory Visit: Payer: Self-pay

## 2023-07-16 ENCOUNTER — Ambulatory Visit (INDEPENDENT_AMBULATORY_CARE_PROVIDER_SITE_OTHER): Payer: Medicare Other | Admitting: Internal Medicine

## 2023-07-16 ENCOUNTER — Encounter: Payer: Self-pay | Admitting: Internal Medicine

## 2023-07-16 VITALS — BP 129/76 | HR 94 | Resp 16 | Ht 61.0 in | Wt 136.0 lb

## 2023-07-16 DIAGNOSIS — R300 Dysuria: Secondary | ICD-10-CM | POA: Diagnosis present

## 2023-07-16 NOTE — Progress Notes (Unsigned)
Patient ID: Emily Cannon, female   DOB: Sep 09, 1932, 89 y.o.   MRN: [id number]  HPI Emily Cannon is an 89 yo F with ontinues with hipprex   Outpatient Encounter Medications as of 07/16/2023  Medication Sig   acetaminophen (TYLENOL) 500 MG tablet Take 500 mg by mouth every 6 (six) hours as needed.   amLODipine (NORVASC) 5 MG tablet Take 5 mg by mouth daily.   atorvastatin (LIPITOR) 20 MG tablet TAKE 1 TABLET BY MOUTH DAILY   Avacopan (TAVNEOS) 10 MG CAPS Take 2 capsules by mouth twice daily with meals.   Cholecalciferol 25 MCG (1000 UT) tablet Take 1 capsule by mouth daily.   esomeprazole (NEXIUM) 40 MG capsule TAKE 1 CAPSULE BY MOUTH DAILY   estradiol (ESTRACE) 0.1 MG/GM vaginal cream Place 0.5 g vaginally 2 (two) times a week. Place 0.5g nightly for two weeks then twice a week after   furosemide (LASIX) 40 MG tablet Take 0.5 tablets (20 mg total) by mouth 3 (three) times a week. Monday, Wed, Friday (Patient taking differently: Take 40 mg by mouth every Monday, Wednesday, and Friday.)   gabapentin (NEURONTIN) 100 MG capsule Take 2 capsules by mouth at night for neuropathy pain (Patient taking differently: 100 mg 2 (two) times daily. Take 2 capsules by mouth at night for neuropathy pain)   levothyroxine (SYNTHROID) 50 MCG tablet TAKE ONE TABLET BY MOUTH EVERY MORNING BEFORE BREAKFAST   methenamine (HIPREX) 1 g tablet Take 1 tablet (1 g total) by mouth 2 (two) times daily with a meal.   metoprolol succinate (TOPROL-XL) 50 MG 24 hr tablet TAKE 1 TABLET BY MOUTH 2 TIMES A DAY WITH OR IMMEDIATELY FOLLOWING A MEAL   Multiple Vitamins-Minerals (CENTRUM SILVER 50+WOMEN) TABS Take 1 tablet by mouth daily with breakfast.   silver sulfADIAZINE (SILVADENE) 1 % cream Apply 1 Application topically daily.   traMADol (ULTRAM) 50 MG tablet TAKE ONE TABLET BY MOUTH EVERY 6 HOURS AS NEEDED FOR UP TO 5 DAYS   valACYclovir (VALTREX) 500 MG tablet Take 500 mg by mouth 2 (two) times daily.   VASCEPA 1 g capsule  TAKE 2 CAPSULES BY MOUTH TWICE A DAY   Vibegron (GEMTESA) 75 MG TABS Take 1 tablet (75 mg total) by mouth daily.   No facility-administered encounter medications on file as of 07/16/2023.     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) 09/25/2020   Peripheral neuropathy 05/08/2020   Weakness 03/28/2020   Gait abnormality 03/28/2020   Paresthesia 03/28/2020   Left foot drop 03/23/2020   Heme positive stool    Benign neoplasm of colon    Acute blood loss anemia 12/30/2018   Symptomatic anemia 12/15/2018   AKI (acute kidney injury) (HCC) 12/15/2018   Hypomagnesemia 12/15/2018   Steroid-induced hyperglycemia 12/15/2018   Microscopic polyangiitis (HCC) 11/13/2018   Positive P-ANCA titer 10/14/2018   Viral URI 05/12/2018   Syncope 06/05/2016   Venous (peripheral) insufficiency 11/23/2012   Dermatitis 11/10/2012   Hypothyroidism 05/22/2011   Perforation of tympanic membrane 08/08/2007   Osteopenia 07/27/2007   Hyperlipidemia 05/11/2007   Anxiety state 05/11/2007   Essential hypertension 05/11/2007   Allergic rhinitis 05/11/2007   Irritable bowel syndrome 05/11/2007   LOW BACK PAIN 05/11/2007   Acquired  absence of genital organ 05/11/2007     Health Maintenance Due  Topic Date Due   COVID-19 Vaccine (6 - 2024-25 season) 07/04/2023     Review of Systems  Physical Exam   BP 129/76   Pulse 94   Resp 16   Ht 5\' 1"  (1.549 m)   Wt 136 lb (61.7 kg)   SpO2 96%   BMI 25.70 kg/m    No results found for: "CD4TCELL" No results found for: "CD4TABS" No results found for: "HIV1RNAQUANT" No results found for: "HEPBSAB" Lab Results  Component Value Date   LABRPR Non Reactive 07/18/2020    CBC Lab Results  Component  Value Date   WBC 5.6 04/08/2023   RBC 4.53 04/08/2023   HGB 14.3 04/08/2023   HCT 40.8 04/08/2023   PLT 169 04/08/2023   MCV 90.1 04/08/2023   MCH 31.6 04/08/2023   MCHC 35.0 04/08/2023   RDW 13.6 04/08/2023   LYMPHSABS 2,257 04/08/2023   MONOABS 0.8 08/30/2022   EOSABS 190 04/08/2023    BMET Lab Results  Component Value Date   NA 140 05/13/2023   K 4.5 05/13/2023   CL 104 05/13/2023   CO2 26 05/13/2023   GLUCOSE 96 05/13/2023   BUN 16 05/13/2023   CREATININE 1.16 (H) 05/13/2023   CALCIUM 9.4 05/13/2023   GFRNONAA 20 (L) 01/03/2019   GFRAA 23 (L) 01/03/2019      Assessment and Plan

## 2023-07-18 ENCOUNTER — Encounter (HOSPITAL_COMMUNITY): Payer: Self-pay

## 2023-07-24 ENCOUNTER — Encounter: Payer: Medicare Other | Attending: Obstetrics | Admitting: Physical Therapy

## 2023-07-24 ENCOUNTER — Other Ambulatory Visit: Payer: Self-pay

## 2023-07-24 ENCOUNTER — Encounter: Payer: Self-pay | Admitting: Physical Therapy

## 2023-07-24 DIAGNOSIS — M6281 Muscle weakness (generalized): Secondary | ICD-10-CM | POA: Diagnosis present

## 2023-07-24 DIAGNOSIS — R3915 Urgency of urination: Secondary | ICD-10-CM | POA: Diagnosis present

## 2023-07-24 NOTE — Therapy (Signed)
OUTPATIENT PHYSICAL THERAPY FEMALE PELVIC EVALUATION   Patient Name: Emily Cannon MRN: [id number] DOB:Apr 18, 1933, 89 y.o., female Today's Date: 07/24/2023  END OF SESSION:  PT End of Session - 07/24/23 1131     Visit Number 1    Date for PT Re-Evaluation 09/18/23    Authorization Type Medicare    Progress Note Due on Visit 10    PT Start Time 1125    PT Stop Time 1215    PT Time Calculation (min) 50 min    Activity Tolerance Patient tolerated treatment well    Behavior During Therapy WFL for tasks assessed/performed             Past Medical History:  Diagnosis Date   Allergic rhinitis, cause unspecified    Allergy    Anxiety state, unspecified    Blood transfusion without reported diagnosis    2020   Cataract    removed both eyes    Chronic kidney disease    Disorder of bone and cartilage, unspecified    Esophageal candidiasis (HCC)    Foot drop    Gastric ulcer    History of CMV    History of Helicobacter pylori infection    Hypothyroid    Irritable bowel syndrome    Lumbago    Microscopic polyangiitis (HCC)    Other and unspecified hyperlipidemia    Pancreatic cyst    Perforation of tympanic membrane, unspecified    Unspecified essential hypertension    Past Surgical History:  Procedure Laterality Date   ABDOMINAL HYSTERECTOMY     BIOPSY  01/02/2019   Procedure: BIOPSY;  Surgeon: Benancio Deeds, MD;  Location: [facility] ENDOSCOPY;  Service: Gastroenterology;;   cataract surgery  5/12 and 12/24/2012   both eyes   COLONOSCOPY     COLONOSCOPY WITH PROPOFOL N/A 01/02/2019   Procedure: COLONOSCOPY WITH PROPOFOL;  Surgeon: Benancio Deeds, MD;  Location: [facility] ENDOSCOPY;  Service: Gastroenterology;  Laterality: N/A;   ESOPHAGOGASTRODUODENOSCOPY (EGD) WITH PROPOFOL N/A 01/02/2019   Procedure: ESOPHAGOGASTRODUODENOSCOPY (EGD) WITH PROPOFOL;  Surgeon: Benancio Deeds, MD;  Location: [facility] ENDOSCOPY;  Service: Gastroenterology;  Laterality: N/A;   NSVD     x1    POLYPECTOMY  01/02/2019   Procedure: POLYPECTOMY;  Surgeon: Benancio Deeds, MD;  Location: [facility] ENDOSCOPY;  Service: Gastroenterology;;   POLYPECTOMY     TONSILLECTOMY     UPPER GASTROINTESTINAL ENDOSCOPY     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) 09/25/2020   Peripheral neuropathy 05/08/2020   Weakness 03/28/2020   Gait abnormality 03/28/2020   Paresthesia 03/28/2020   Left foot drop 03/23/2020   Heme positive stool    Benign neoplasm of colon    Acute blood loss anemia 12/30/2018   Symptomatic anemia 12/15/2018   AKI (acute kidney injury) (HCC) 12/15/2018   Hypomagnesemia 12/15/2018   Steroid-induced hyperglycemia 12/15/2018   Microscopic polyangiitis (HCC) 11/13/2018   Positive P-ANCA titer 10/14/2018   Viral URI 05/12/2018   Syncope 06/05/2016   Venous (peripheral) insufficiency 11/23/2012   Dermatitis 11/10/2012   Hypothyroidism 05/22/2011   Perforation of tympanic membrane 08/08/2007   Osteopenia 07/27/2007   Hyperlipidemia 05/11/2007   Anxiety state 05/11/2007   Essential hypertension 05/11/2007   Allergic rhinitis 05/11/2007  Irritable bowel syndrome 05/11/2007   LOW BACK PAIN 05/11/2007   Acquired absence of genital organ 05/11/2007    PCP: Kristian Covey, MD  REFERRING PROVIDER: Loleta Chance, MD   REFERRING DIAG:  N39.41 (ICD-10-CM) - Urge urinary incontinence  R35.1 (ICD-10-CM) - Nocturia    THERAPY DIAG:  Muscle weakness (generalized)  Urinary urgency  Rationale for Evaluation and Treatment: Rehabilitation  ONSET DATE: 2022  SUBJECTIVE:                                                                                                                                                                                            SUBJECTIVE STATEMENT: Patient has burning when urinating. She will go to the bathroom every 20 minutes.  Fluid intake:  36oz water per day, 4 cups of coffee intake    PAIN:  Are you having pain? Yes NPRS scale: 8/10 Pain location: Vaginal  Pain type: burning Pain description: intermittent   Aggravating factors: when urinating Relieving factors: not urinating  PRECAUTIONS: None  RED FLAGS: None   WEIGHT BEARING RESTRICTIONS: No  FALLS:  Has patient fallen in last 6 months? No  LIVING ENVIRONMENT: Lives with: lives with their spouse   OCCUPATION: retired  PLOF: Independent  PATIENT GOALS: reduce the urinary leakage and reduce the number pads.   PERTINENT HISTORY:  Ambulates with a cane with leg braces for foot drop since hospitalization for Wegener granulomatosis diagnosis in 2020 managed by rheumatology ; IBS; Hypothyroid; Abdominal hysterectomy;    URINATION: Pain with urination: Yes Fully empty bladder: sit to lean forward to fully empty her bladder;  dribbling after finishing, and the need to urinate multiple times in a row  Stream: Weak Urgency: Yes: while walking to the bathroom and while she is getting her underwear off Frequency: Day time voids >15.  Nocturia: 2-5 times per night to void.  Leakage: Urge to void, Walking to the bathroom, Coughing, Sneezing, Laughing, Exercise, and going from sitting to  standing Pads: 3-4 adult diapers per day    PREGNANCY: Vaginal deliveries 1 Tearing No  PROLAPSE: None   OBJECTIVE:  Note: Objective measures were completed at Evaluation unless otherwise noted.  DIAGNOSTIC FINDINGS:  Pelvic floor strength V/V, puborectalis V/V  Pelvic floor musculature: Right levator tender, Right obturator non-tender, Left levator non-tender, Left obturator non-tender  Post Void Residual 47 mL     COGNITION: Overall cognitive status: Within functional limits for tasks  assessed      GAIT:  Assistive device utilized: Walker - 4 wheeled Level of assistance: Complete Independence Comments: She wears braces on her lower legs  POSTURE: No  Significant postural limitations  PELVIC ALIGNMENT:   LOWER EXTREMITY ROM:  Passive ROM Right eval Left eval  Hip internal rotation 10 20  Hip external rotation 35 30   (Blank rows = not tested)  LOWER EXTREMITY MMT:  MMT Right eval Left eval  Hip extension 4/5 4/5  Hip abduction 3+/5 3+/5   PALPATION:   General  right lower quadrant tenderness; Patient will bulge the lower abdomen when contracting, patient will bulge her abdomen when contracting the pelvic floor.              Patient confirms identification and approves PT to assess internal pelvic floor and treatment No         TODAY'S TREATMENT:                                                                                                                              DATE: 07/24/23  EVAL See below   PATIENT EDUCATION:  07/24/23 Education details: Access Code: [email or web address] Person educated: Patient Education method: Programmer, multimedia, Demonstration, Actor cues, Verbal cues, and Handouts Education comprehension: verbalized understanding, returned demonstration, verbal cues required, tactile cues required, and needs further education  HOME EXERCISE PROGRAM: 07/24/23 Access Code: [email or web address] URL: [email or web address] Date: 07/24/2023 Prepared by: Eulis Foster  Program Notes use the estrogen cream around the urethra  Exercises - Hooklying Transversus Abdominis Palpation  - 1 x daily - 7 x weekly - 1 sets - 10 reps - Supine Bridge with Mini Swiss Ball Between Knees  - 1 x daily - 7 x weekly - 1 sets - 10 reps - Hooklying Isometric Hip Flexion  - 1 x daily - 7 x weekly - 2 sets - 10 reps  ASSESSMENT:  CLINICAL IMPRESSION: Patient is a 89 y.o. female  who was seen today for physical therapy evaluation and treatment for urge  incontinence and nocturia. Patient reports her urinary leakage became worse 2 years ago.   She ambulates with a walker and leg braces. Patient will leak urine with urge to void, walking to the bathroom, coughing, Sneezing, laughing, exercise, as she is pulling her pants down to urinate, and going from sitting to  standing. She wears 3-4 adult diapers per day. She goes to the bathroom every 20 minutes when she is home and less when she is out. She wakes up 2-5 times at night to urinate. She has to lean forward to fully empty her bladder. Patient reports burning pain with urination at level 10/10. Patient has weakness in her hips and limited rotation in the hips. Patient is not comfortable with therapist assessing pelvic floor. Patient needs skilled therapy to improve pelvic floor strength and coordination to reduce urinary leakage.   OBJECTIVE IMPAIRMENTS: decreased coordination, decreased strength, and pain.   ACTIVITY LIMITATIONS: continence, toileting, and locomotion level  PARTICIPATION LIMITATIONS:  home life  PERSONAL FACTORS: Age, Fitness, and 3+ comorbidities: Ambulates with a cane with leg braces for foot drop since hospitalization  for Wegener granulomatosis diagnosis in 2020 managed by rheumatology ; IBS; Hypothyroid; Abdominal hysterectomy;   are also affecting patient's functional outcome.   REHAB POTENTIAL: Good  CLINICAL DECISION MAKING: Evolving/moderate complexity  EVALUATION COMPLEXITY: Moderate   GOALS: Goals reviewed with patient? Yes  SHORT TERM GOALS: Target date: 08/21/23  Patient is independent with initial HEP for pelvic floor and core.  Baseline: Goal status: INITIAL  2.  Patient is educated on urge to void to deter urination.  Baseline:  Goal status: INITIAL  3.  Patient is able to wait 30-45 minutes to urinate while at home.  Baseline:  Goal status: INITIAL   LONG TERM GOALS: Target date: 09/18/23  Patient is independent with advanced HEP for pelvic floor  and core strength.  Baseline:  Goal status: INITIAL  2.  Patient reports she is able to wait >/= 1.5 hours to urinate when at home during the day due to improved coordination of the pelvic floor.  Baseline:  Goal status: INITIAL  3.  Patient is able to urinate during the night </= 2 times per night due to the ability to use the urge to void behavioral technique.  Baseline:  Goal status: INITIAL  4.  Patient is able to wear 1 pull up per day and 1 per night due to her reduction of urinary leakage.  Baseline:  Goal status: INITIAL  5.  Burning during urinate decreased >/= 25% due to the use of her estrogen cream and not wearing as many pull ups.  Baseline:  Goal status: INITIAL   PLAN:  PT FREQUENCY: 1x/week  PT DURATION: 8 weeks  PLANNED INTERVENTIONS: 97110-Therapeutic exercises, 97530- Therapeutic activity, 97112- Neuromuscular re-education, 97140- Manual therapy, Patient/Family education, and Biofeedback  PLAN FOR NEXT SESSION: urge to void technique; abdominal contraction, core strength with pelvic floor contraction   Eulis Foster, PT 07/24/23 1:54 PM

## 2023-07-30 ENCOUNTER — Encounter (HOSPITAL_COMMUNITY): Payer: Self-pay

## 2023-07-31 ENCOUNTER — Encounter: Payer: Self-pay | Admitting: Physical Therapy

## 2023-07-31 ENCOUNTER — Encounter: Payer: Medicare Other | Attending: Obstetrics | Admitting: Physical Therapy

## 2023-07-31 DIAGNOSIS — N3941 Urge incontinence: Secondary | ICD-10-CM | POA: Diagnosis present

## 2023-07-31 DIAGNOSIS — R3915 Urgency of urination: Secondary | ICD-10-CM | POA: Diagnosis present

## 2023-07-31 DIAGNOSIS — R351 Nocturia: Secondary | ICD-10-CM | POA: Insufficient documentation

## 2023-07-31 DIAGNOSIS — M6281 Muscle weakness (generalized): Secondary | ICD-10-CM | POA: Diagnosis present

## 2023-07-31 NOTE — Patient Instructions (Signed)
 Moisturizers They are used in the vagina to hydrate the mucous membrane that make up the vaginal canal. Designed to keep a more normal acid balance (ph) Use daily at bedtime  Ingredients to avoid is glycerin and fragrance, can increase chance of infection    Creams to use externally on the Vulva area Vulva Balm/ V-magic cream by medicine mama- amazon Julva-amazon Vital V Wild Yam salve ( help moisturize and help with thinning vulvar area, does have Beeswax MoodMaid Botanical Pro-Meno Wild Yam Cream- [employer] Desert Harvest Gele Cleo by Sherrlyn labial moisturizer (Amazon),  Coconut or olive oil aloe Good Clean Love-target Enchanted Rose by intimate rose Yes  Things to avoid in the vaginal area Do not use things to irritate the vulvar area No lotions just specialized creams for the vulva area- Neogyn, V-magic,  No soaps; can use Aveeno or Calendula cleanser, unscented Dove if needed. Must be gentle No deodorants No douches Good to sleep without underwear to let the vaginal area to air out No scrubbing: spread the lips to let warm water rinse over labias and pat dry   Channing Pereyra, PT [facility] [address] [place], [place] [address] W: [phone] [email or web address]

## 2023-07-31 NOTE — Therapy (Signed)
 OUTPATIENT PHYSICAL THERAPY FEMALE PELVIC TREATMENT   Patient Name: Emily Cannon MRN: [id number] DOB:03-09-1933, 89 y.o., female Today's Date: 07/31/2023  END OF SESSION:  PT End of Session - 07/31/23 1143     Visit Number 2    Date for PT Re-Evaluation 09/18/23    Authorization Type Medicare    Authorization - Number of Visits 2    Progress Note Due on Visit 10    PT Start Time 1140    PT Stop Time 1220    PT Time Calculation (min) 40 min    Activity Tolerance Patient tolerated treatment well    Behavior During Therapy WFL for tasks assessed/performed             Past Medical History:  Diagnosis Date   Allergic rhinitis, cause unspecified    Allergy    Anxiety state, unspecified    Blood transfusion without reported diagnosis    2020   Cataract    removed both eyes    Chronic kidney disease    Disorder of bone and cartilage, unspecified    Esophageal candidiasis (HCC)    Foot drop    Gastric ulcer    History of CMV    History of Helicobacter pylori infection    Hypothyroid    Irritable bowel syndrome    Lumbago    Microscopic polyangiitis (HCC)    Other and unspecified hyperlipidemia    Pancreatic cyst    Perforation of tympanic membrane, unspecified    Unspecified essential hypertension    Past Surgical History:  Procedure Laterality Date   ABDOMINAL HYSTERECTOMY     BIOPSY  01/02/2019   Procedure: BIOPSY;  Surgeon: Leigh Elspeth SQUIBB, MD;  Location: [facility] ENDOSCOPY;  Service: Gastroenterology;;   cataract surgery  5/12 and 12/24/2012   both eyes   COLONOSCOPY     COLONOSCOPY WITH PROPOFOL  N/A 01/02/2019   Procedure: COLONOSCOPY WITH PROPOFOL ;  Surgeon: Leigh Elspeth SQUIBB, MD;  Location: [facility] ENDOSCOPY;  Service: Gastroenterology;  Laterality: N/A;   ESOPHAGOGASTRODUODENOSCOPY (EGD) WITH PROPOFOL  N/A 01/02/2019   Procedure: ESOPHAGOGASTRODUODENOSCOPY (EGD) WITH PROPOFOL ;  Surgeon: Leigh Elspeth SQUIBB, MD;  Location: [facility] ENDOSCOPY;  Service:  Gastroenterology;  Laterality: N/A;   NSVD     x1   POLYPECTOMY  01/02/2019   Procedure: POLYPECTOMY;  Surgeon: Leigh Elspeth SQUIBB, MD;  Location: [facility] ENDOSCOPY;  Service: Gastroenterology;;   POLYPECTOMY     TONSILLECTOMY     UPPER GASTROINTESTINAL ENDOSCOPY     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) 09/25/2020   Peripheral neuropathy 05/08/2020   Weakness 03/28/2020   Gait abnormality 03/28/2020   Paresthesia 03/28/2020   Left foot drop 03/23/2020   Heme positive stool    Benign neoplasm of colon    Acute blood loss anemia 12/30/2018   Symptomatic anemia 12/15/2018   AKI (acute kidney injury) (HCC) 12/15/2018   Hypomagnesemia 12/15/2018   Steroid-induced hyperglycemia 12/15/2018   Microscopic polyangiitis (HCC) 11/13/2018   Positive P-ANCA titer 10/14/2018   Viral URI 05/12/2018   Syncope 06/05/2016   Venous (peripheral) insufficiency 11/23/2012   Dermatitis 11/10/2012   Hypothyroidism 05/22/2011   Perforation of tympanic membrane 08/08/2007   Osteopenia 07/27/2007   Hyperlipidemia 05/11/2007   Anxiety state 05/11/2007   Essential  hypertension 05/11/2007   Allergic rhinitis 05/11/2007   Irritable bowel syndrome 05/11/2007   LOW BACK PAIN 05/11/2007   Acquired absence of genital organ 05/11/2007    PCP: Micheal Wolm ORN, MD  REFERRING PROVIDER: Guadlupe Lianne DASEN, MD   REFERRING DIAG:  N39.41 (ICD-10-CM) - Urge urinary incontinence  R35.1 (ICD-10-CM) - Nocturia    THERAPY DIAG:  Muscle weakness (generalized)  Urinary urgency  Rationale for Evaluation and Treatment: Rehabilitation  ONSET DATE: 2022  SUBJECTIVE:                                                                                                                                                                                            SUBJECTIVE STATEMENT: I get leg cramps when doing the exercise. I am still getting the burning with urination. I am still urinating a lot.  Fluid intake:  36oz water per day, 4 cups of coffee intake    PAIN:  Are you having pain? Yes NPRS scale: 8/10 Pain location: Vaginal  Pain type: burning Pain description: intermittent   Aggravating factors: when urinating Relieving factors: not urinating  PRECAUTIONS: None  RED FLAGS: None   WEIGHT BEARING RESTRICTIONS: No  FALLS:  Has patient fallen in last 6 months? No  LIVING ENVIRONMENT: Lives with: lives with their spouse   OCCUPATION: retired  PLOF: Independent  PATIENT GOALS: reduce the urinary leakage and reduce the number pads.   PERTINENT HISTORY:  Ambulates with a cane with leg braces for foot drop since hospitalization for Wegener granulomatosis diagnosis in 2020 managed by rheumatology ; IBS; Hypothyroid; Abdominal hysterectomy;    URINATION: Pain with urination: Yes Fully empty bladder: sit to lean forward to fully empty her bladder;  dribbling after finishing, and the need to urinate multiple times in a row  Stream: Weak Urgency: Yes: while walking to the bathroom and while she is getting her underwear off Frequency: Day time voids >15.  Nocturia: 2-5 times per night to void.  Leakage: Urge to void, Walking to the bathroom, Coughing, Sneezing, Laughing, Exercise, and going from sitting to  standing Pads: 3-4 adult diapers per day    PREGNANCY: Vaginal deliveries 1 Tearing No  PROLAPSE: None   OBJECTIVE:  Note: Objective measures were completed at Evaluation unless otherwise noted.  DIAGNOSTIC FINDINGS:  Pelvic floor strength V/V, puborectalis V/V  Pelvic floor musculature: Right levator tender, Right obturator non-tender, Left levator non-tender, Left obturator non-tender  Post Void Residual 47 mL      COGNITION: Overall cognitive status: Within functional limits for tasks assessed      GAIT:  Assistive device utilized: Environmental Consultant - 4  wheeled Level of assistance: Complete Independence Comments: She wears braces on her lower legs  POSTURE: No Significant postural limitations  PELVIC ALIGNMENT:   LOWER EXTREMITY ROM:  Passive ROM Right eval Left eval  Hip internal rotation 10 20  Hip external rotation 35 30   (Blank rows = not tested)  LOWER EXTREMITY MMT:  MMT Right eval Left eval  Hip extension 4/5 4/5  Hip abduction 3+/5 3+/5   PALPATION:   General  right lower quadrant tenderness; Patient will bulge the lower abdomen when contracting, patient will bulge her abdomen when contracting the pelvic floor.              Patient confirms identification and approves PT to assess internal pelvic floor and treatment No         TODAY'S TREATMENT:       07/31/23 Exercises: Stretches/mobility: Educated patient on vaginal moisturizers for daily use at night , ways to take care of the vulva area Strengthening: Supine ball squeeze with pelvic floor contraction 20 x and tactile cues to assist her with contracting the pelvic floor Ball squeeze with pelvic floor contraction and doing a bridge 20 x  Supine hip flexion isometric 10 x each leg to feel the lower abdominals contract and pelvic floor Sitting pelvic floor contraction with ball squeeze holding 3 sec 20 x Sitting shoulder protraction with yellow band and ball squeeze Sitting ball squeeze with elbow flexion using yellow band 2 x 10                                                                                                                            PATIENT EDUCATION:  07/31/23 Education details: Access Code: [email or web address] Person educated: Patient Education method: Programmer, Multimedia, Demonstration, Actor cues, Verbal cues, and Handouts Education comprehension: verbalized understanding, returned demonstration, verbal cues  required, tactile cues required, and needs further education  HOME EXERCISE PROGRAM: 07/31/23 Access Code: [email or web address] URL: [email or web address] Date: 07/31/2023 Prepared by: Channing Pereyra  Program Notes use the estrogen cream around the urethra  Exercises - Hooklying Transversus Abdominis Palpation  - 1 x daily - 7 x weekly - 1 sets - 10 reps - Supine Bridge with Mini Swiss Ball Between Knees  - 1 x daily - 7 x weekly - 1 sets - 10 reps - Hooklying Isometric Hip Flexion  - 1 x daily - 7 x weekly - 2 sets - 10 reps - Seated Hip Adduction Squeeze with Ball  - 1 x daily - 7 x weekly - 1 sets - 10 reps - 3 sec hold - Seated Abdominal Press into [employer]  - 1 x daily - 3 x weekly - 1 sets - 10 reps - 3 sec hold - Seated Scapular Protraction with Resistance  - 1 x daily - 3 x weekly - 2 sets - 10 reps - Seated Elbow Flexion with Resistance Under Foot  - 1 x daily - 3 x weekly - 2 sets -  10 reps  ASSESSMENT:  CLINICAL IMPRESSION: Patient is a 89 y.o. female  who was seen today for physical therapy  treatment for urge incontinence and nocturia.  Patient continues to have burning in the urethra with urination and will start to use vaginal moisturizers to assist with health of tissue. She is able to feel the vaginal lift but will use her buttocks to assist. Patient needs skilled therapy to improve pelvic floor strength and coordination to reduce urinary leakage.   OBJECTIVE IMPAIRMENTS: decreased coordination, decreased strength, and pain.   ACTIVITY LIMITATIONS: continence, toileting, and locomotion level  PARTICIPATION LIMITATIONS:  home life  PERSONAL FACTORS: Age, Fitness, and 3+ comorbidities: Ambulates with a cane with leg braces for foot drop since hospitalization for Wegener granulomatosis diagnosis in 2020 managed by rheumatology ; IBS; Hypothyroid; Abdominal hysterectomy;   are also affecting patient's functional outcome.   REHAB POTENTIAL: Good  CLINICAL DECISION  MAKING: Evolving/moderate complexity  EVALUATION COMPLEXITY: Moderate   GOALS: Goals reviewed with patient? Yes  SHORT TERM GOALS: Target date: 08/21/23  Patient is independent with initial HEP for pelvic floor and core.  Baseline: Goal status: INITIAL  2.  Patient is educated on urge to void to deter urination.  Baseline:  Goal status: INITIAL  3.  Patient is able to wait 30-45 minutes to urinate while at home.  Baseline:  Goal status: INITIAL   LONG TERM GOALS: Target date: 09/18/23  Patient is independent with advanced HEP for pelvic floor and core strength.  Baseline:  Goal status: INITIAL  2.  Patient reports she is able to wait >/= 1.5 hours to urinate when at home during the day due to improved coordination of the pelvic floor.  Baseline:  Goal status: INITIAL  3.  Patient is able to urinate during the night </= 2 times per night due to the ability to use the urge to void behavioral technique.  Baseline:  Goal status: INITIAL  4.  Patient is able to wear 1 pull up per day and 1 per night due to her reduction of urinary leakage.  Baseline:  Goal status: INITIAL  5.  Burning during urinate decreased >/= 25% due to the use of her estrogen cream and not wearing as many pull ups.  Baseline:  Goal status: INITIAL   PLAN:  PT FREQUENCY: 1x/week  PT DURATION: 8 weeks  PLANNED INTERVENTIONS: 97110-Therapeutic exercises, 97530- Therapeutic activity, 97112- Neuromuscular re-education, 97140- Manual therapy, Patient/Family education, and Biofeedback  PLAN FOR NEXT SESSION: urge to void technique; abdominal contraction, core strength with pelvic floor contraction   Channing Pereyra, PT 07/31/23 12:35 PM

## 2023-08-02 ENCOUNTER — Encounter (HOSPITAL_COMMUNITY): Payer: Self-pay

## 2023-08-05 ENCOUNTER — Other Ambulatory Visit (INDEPENDENT_AMBULATORY_CARE_PROVIDER_SITE_OTHER): Payer: Medicare Other

## 2023-08-05 ENCOUNTER — Other Ambulatory Visit: Payer: Self-pay | Admitting: Family Medicine

## 2023-08-05 DIAGNOSIS — E785 Hyperlipidemia, unspecified: Secondary | ICD-10-CM

## 2023-08-05 DIAGNOSIS — R300 Dysuria: Secondary | ICD-10-CM

## 2023-08-05 LAB — LIPID PANEL
Cholesterol: 167 mg/dL (ref 0–200)
HDL: 34.4 mg/dL — ABNORMAL LOW (ref >39.00)
LDL Cholesterol: 59 mg/dL (ref 0–99)
NonHDL: 132.29
Total CHOL/HDL Ratio: 5
Triglycerides: 366 mg/dL — ABNORMAL HIGH (ref 0.0–149.0)
VLDL: 73.2 mg/dL — ABNORMAL HIGH (ref 0.0–40.0)

## 2023-08-06 ENCOUNTER — Ambulatory Visit (INDEPENDENT_AMBULATORY_CARE_PROVIDER_SITE_OTHER): Payer: Medicare Other | Admitting: Podiatry

## 2023-08-06 DIAGNOSIS — M79675 Pain in left toe(s): Secondary | ICD-10-CM

## 2023-08-06 DIAGNOSIS — B351 Tinea unguium: Secondary | ICD-10-CM

## 2023-08-06 DIAGNOSIS — M79674 Pain in right toe(s): Secondary | ICD-10-CM

## 2023-08-07 ENCOUNTER — Encounter: Payer: Medicare Other | Admitting: Physical Therapy

## 2023-08-07 ENCOUNTER — Encounter: Payer: Self-pay | Admitting: Physical Therapy

## 2023-08-07 DIAGNOSIS — N3941 Urge incontinence: Secondary | ICD-10-CM | POA: Diagnosis not present

## 2023-08-07 DIAGNOSIS — R3915 Urgency of urination: Secondary | ICD-10-CM

## 2023-08-07 DIAGNOSIS — M6281 Muscle weakness (generalized): Secondary | ICD-10-CM

## 2023-08-07 LAB — URINALYSIS W MICROSCOPIC + REFLEX CULTURE
Bacteria, UA: NONE SEEN /HPF
Bilirubin Urine: NEGATIVE
Glucose, UA: NEGATIVE
Hgb urine dipstick: NEGATIVE
Ketones, ur: NEGATIVE
Nitrites, Initial: NEGATIVE
Specific Gravity, Urine: 1.019 (ref 1.001–1.035)
WBC, UA: >=60 /[HPF] — AB (ref 0–5)
pH: 7 (ref 5.0–8.0)

## 2023-08-07 LAB — URINE CULTURE
MICRO NUMBER:: 15929815
SPECIMEN QUALITY:: ADEQUATE

## 2023-08-07 LAB — CULTURE INDICATED

## 2023-08-07 NOTE — Therapy (Signed)
 OUTPATIENT PHYSICAL THERAPY FEMALE PELVIC TREATMENT   Patient Name: Emily Cannon MRN: [id number] DOB:Jan 06, 1933, 89 y.o., female Today's Date: 08/07/2023  END OF SESSION:  PT End of Session - 08/07/23 1353     Visit Number 3    Date for PT Re-Evaluation 09/18/23    Authorization Type Medicare    Authorization - Number of Visits 3    Progress Note Due on Visit 10    PT Start Time 1350    PT Stop Time 1440    PT Time Calculation (min) 50 min    Activity Tolerance Patient tolerated treatment well    Behavior During Therapy WFL for tasks assessed/performed             Past Medical History:  Diagnosis Date   Allergic rhinitis, cause unspecified    Allergy    Anxiety state, unspecified    Blood transfusion without reported diagnosis    2020   Cataract    removed both eyes    Chronic kidney disease    Disorder of bone and cartilage, unspecified    Esophageal candidiasis (HCC)    Foot drop    Gastric ulcer    History of CMV    History of Helicobacter pylori infection    Hypothyroid    Irritable bowel syndrome    Lumbago    Microscopic polyangiitis (HCC)    Other and unspecified hyperlipidemia    Pancreatic cyst    Perforation of tympanic membrane, unspecified    Unspecified essential hypertension    Past Surgical History:  Procedure Laterality Date   ABDOMINAL HYSTERECTOMY     BIOPSY  01/02/2019   Procedure: BIOPSY;  Surgeon: Leigh Elspeth SQUIBB, MD;  Location: [facility] ENDOSCOPY;  Service: Gastroenterology;;   cataract surgery  5/12 and 12/24/2012   both eyes   COLONOSCOPY     COLONOSCOPY WITH PROPOFOL  N/A 01/02/2019   Procedure: COLONOSCOPY WITH PROPOFOL ;  Surgeon: Leigh Elspeth SQUIBB, MD;  Location: [facility] ENDOSCOPY;  Service: Gastroenterology;  Laterality: N/A;   ESOPHAGOGASTRODUODENOSCOPY (EGD) WITH PROPOFOL  N/A 01/02/2019   Procedure: ESOPHAGOGASTRODUODENOSCOPY (EGD) WITH PROPOFOL ;  Surgeon: Leigh Elspeth SQUIBB, MD;  Location: [facility] ENDOSCOPY;  Service:  Gastroenterology;  Laterality: N/A;   NSVD     x1   POLYPECTOMY  01/02/2019   Procedure: POLYPECTOMY;  Surgeon: Leigh Elspeth SQUIBB, MD;  Location: [facility] ENDOSCOPY;  Service: Gastroenterology;;   POLYPECTOMY     TONSILLECTOMY     UPPER GASTROINTESTINAL ENDOSCOPY     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) 09/25/2020   Peripheral neuropathy 05/08/2020   Weakness 03/28/2020   Gait abnormality 03/28/2020   Paresthesia 03/28/2020   Left foot drop 03/23/2020   Heme positive stool    Benign neoplasm of colon    Acute blood loss anemia 12/30/2018   Symptomatic anemia 12/15/2018   AKI (acute kidney injury) (HCC) 12/15/2018   Hypomagnesemia 12/15/2018   Steroid-induced hyperglycemia 12/15/2018   Microscopic polyangiitis (HCC) 11/13/2018   Positive P-ANCA titer 10/14/2018   Viral URI 05/12/2018   Syncope 06/05/2016   Venous (peripheral) insufficiency 11/23/2012   Dermatitis 11/10/2012   Hypothyroidism 05/22/2011   Perforation of tympanic membrane 08/08/2007   Osteopenia 07/27/2007   Hyperlipidemia 05/11/2007   Anxiety state 05/11/2007   Essential  hypertension 05/11/2007   Allergic rhinitis 05/11/2007   Irritable bowel syndrome 05/11/2007   LOW BACK PAIN 05/11/2007   Acquired absence of genital organ 05/11/2007    PCP: Micheal Wolm ORN, MD  REFERRING PROVIDER: Guadlupe Lianne DASEN, MD   REFERRING DIAG:  N39.41 (ICD-10-CM) - Urge urinary incontinence  R35.1 (ICD-10-CM) - Nocturia    THERAPY DIAG:  Muscle weakness (generalized)  Urinary urgency  Rationale for Evaluation and Treatment: Rehabilitation  ONSET DATE: 2022  SUBJECTIVE:                                                                                                                                                                                            SUBJECTIVE STATEMENT: I get leg cramps when doing the exercise. I am still getting the burning with urination is less with using the moisturizing cream.  I am still urinating a lot.  Fluid intake:  36oz water per day, 4 cups of coffee intake    PAIN:  Are you having pain? Yes NPRS scale: /10 Pain location: Vaginal  Pain type: burning Pain description: intermittent   Aggravating factors: when urinating Relieving factors: not urinating  PRECAUTIONS: None  RED FLAGS: None   WEIGHT BEARING RESTRICTIONS: No  FALLS:  Has patient fallen in last 6 months? No  LIVING ENVIRONMENT: Lives with: lives with their spouse   OCCUPATION: retired  PLOF: Independent  PATIENT GOALS: reduce the urinary leakage and reduce the number pads.   PERTINENT HISTORY:  Ambulates with a cane with leg braces for foot drop since hospitalization for Wegener granulomatosis diagnosis in 2020 managed by rheumatology ; IBS; Hypothyroid; Abdominal hysterectomy;    URINATION: Pain with urination: Yes Fully empty bladder: sit to lean forward to fully empty her bladder;  dribbling after finishing, and the need to urinate multiple times in a row  Stream: Weak Urgency: Yes: while walking to the bathroom and while she is getting her underwear off Frequency: Day time voids >15.  Nocturia: 2-5 times per night to void.  Leakage: Urge to void, Walking to the bathroom, Coughing, Sneezing, Laughing, Exercise, and going from sitting to  standing Pads: 3-4 adult diapers per day    PREGNANCY: Vaginal deliveries 1 Tearing No  PROLAPSE: None   OBJECTIVE:  Note: Objective measures were completed at Evaluation unless otherwise noted.  DIAGNOSTIC FINDINGS:  Pelvic floor strength V/V, puborectalis V/V  Pelvic floor musculature: Right levator tender, Right obturator non-tender, Left levator non-tender, Left obturator  non-tender  Post Void Residual 47 mL     COGNITION: Overall cognitive status: Within functional limits for tasks assessed  GAIT:  Assistive device utilized: Environmental Consultant - 4 wheeled Level of assistance: Complete Independence Comments: She wears braces on her lower legs  POSTURE: No Significant postural limitations  PELVIC ALIGNMENT:   LOWER EXTREMITY ROM:  Passive ROM Right eval Left eval  Hip internal rotation 10 20  Hip external rotation 35 30   (Blank rows = not tested)  LOWER EXTREMITY MMT:  MMT Right eval Left eval  Hip extension 4/5 4/5  Hip abduction 3+/5 3+/5   PALPATION:   General  right lower quadrant tenderness; Patient will bulge the lower abdomen when contracting, patient will bulge her abdomen when contracting the pelvic floor.              Patient confirms identification and approves PT to assess internal pelvic floor and treatment No         TODAY'S TREATMENT:       08/07/23  Exercises: Stretches/mobility: Reviewed the use of the vaginal moisturizers, where to place it and use daily. She is feeling better with the cream.  Strengthening: Sitting pelvic floor contraction 10 x  Therapeutic activities: Functional strengthening activities: Educated patient and husband on urge to void technique to be used during the day and at night to reduce the amount of times to go to the bathroom. Her goal is to wait every hour to urinate during the day and reduce the amount of urinating to 3 times per night compared to 6. Discussed ways for patient to deter the  urge to urinate with doing her crossword puzzles, calling someone, or talking to her husband to get her mind off the urge to urinate. Had patient to practice her pelvic floor contractions sitting and feeling the vagina lift off the chair without contracting her gluteals. Marching in place in standing and sitting to deter the urge to void by disrupting the Bradleys loop. Patient needed many verbal cues and have  the therapist repeat the instructions several times.  Therapist explained to the patient and her husband on how to fill out the bladder diary to see how often she is voiding, how much she is drinking, how often she gets the urge to void and when. She needed instructions repeated several times.      07/31/23 Exercises: Stretches/mobility: Educated patient on vaginal moisturizers for daily use at night , ways to take care of the vulva area Strengthening: Supine ball squeeze with pelvic floor contraction 20 x and tactile cues to assist her with contracting the pelvic floor Ball squeeze with pelvic floor contraction and doing a bridge 20 x  Supine hip flexion isometric 10 x each leg to feel the lower abdominals contract and pelvic floor Sitting pelvic floor contraction with ball squeeze holding 3 sec 20 x Sitting shoulder protraction with yellow band and ball squeeze Sitting ball squeeze with elbow flexion using yellow band 2 x 10  PATIENT EDUCATION:  08/07/23 Education details: Access Code: [email or web address], how to fill out bladder diary, urge to void when have the urge to urinate Person educated: Patient Education method: Explanation, Demonstration, Tactile cues, Verbal cues, and Handouts Education comprehension: verbalized understanding, returned demonstration, verbal cues required, tactile cues required, and needs further education  HOME EXERCISE PROGRAM: 07/31/23 Access Code: [email or web address] URL: [email or web address] Date: 07/31/2023 Prepared by: Channing Pereyra  Program Notes use the estrogen cream around the urethra  Exercises - Hooklying Transversus Abdominis Palpation  - 1 x daily - 7 x weekly - 1 sets - 10 reps - Supine Bridge with Mini Swiss Ball Between Knees  - 1 x daily - 7 x weekly - 1 sets - 10 reps - Hooklying Isometric Hip Flexion  - 1 x daily - 7 x  weekly - 2 sets - 10 reps - Seated Hip Adduction Squeeze with Ball  - 1 x daily - 7 x weekly - 1 sets - 10 reps - 3 sec hold - Seated Abdominal Press into [employer]  - 1 x daily - 3 x weekly - 1 sets - 10 reps - 3 sec hold - Seated Scapular Protraction with Resistance  - 1 x daily - 3 x weekly - 2 sets - 10 reps - Seated Elbow Flexion with Resistance Under Foot  - 1 x daily - 3 x weekly - 2 sets - 10 reps  ASSESSMENT:  CLINICAL IMPRESSION: Patient is a 89 y.o. female  who was seen today for physical therapy  treatment for urge incontinence and nocturia. Patient reports her pain is not as often and the pain level decreased to 6/10 using the cream.   She is able to feel the vaginal lift but will use her buttocks to assist. She will be filling out a bladder diary to see how often she has to urinate and how often she gets the urge. She will be trying the urge to void technique to reduce the amount she voids. Patient needs skilled therapy to improve pelvic floor strength and coordination to reduce urinary leakage.   OBJECTIVE IMPAIRMENTS: decreased coordination, decreased strength, and pain.   ACTIVITY LIMITATIONS: continence, toileting, and locomotion level  PARTICIPATION LIMITATIONS:  home life  PERSONAL FACTORS: Age, Fitness, and 3+ comorbidities: Ambulates with a cane with leg braces for foot drop since hospitalization for Wegener granulomatosis diagnosis in 2020 managed by rheumatology ; IBS; Hypothyroid; Abdominal hysterectomy;   are also affecting patient's functional outcome.   REHAB POTENTIAL: Good  CLINICAL DECISION MAKING: Evolving/moderate complexity  EVALUATION COMPLEXITY: Moderate   GOALS: Goals reviewed with patient? Yes  SHORT TERM GOALS: Target date: 08/21/23  Patient is independent with initial HEP for pelvic floor and core.  Baseline: Goal status: Met 08/07/23  2.  Patient is educated on urge to void to deter urination.  Baseline:  Goal status: Met 08/07/23  3.   Patient is able to wait 30-45 minutes to urinate while at home.  Baseline:  Goal status: ongoing 08/07/23   LONG TERM GOALS: Target date: 09/18/23  Patient is independent with advanced HEP for pelvic floor and core strength.  Baseline:  Goal status: INITIAL  2.  Patient reports she is able to wait >/= 1.5 hours to urinate when at home during the day due to improved coordination of the pelvic floor.  Baseline:  Goal status: INITIAL  3.  Patient is able to urinate during the night </= 2 times per night due to the ability to use the urge  to void behavioral technique.  Baseline:  Goal status: INITIAL  4.  Patient is able to wear 1 pull up per day and 1 per night due to her reduction of urinary leakage.  Baseline:  Goal status: INITIAL  5.  Burning during urinate decreased >/= 25% due to the use of her estrogen cream and not wearing as many pull ups.  Baseline:  Goal status: INITIAL   PLAN:  PT FREQUENCY: 1x/week  PT DURATION: 8 weeks  PLANNED INTERVENTIONS: 97110-Therapeutic exercises, 97530- Therapeutic activity, 97112- Neuromuscular re-education, 97140- Manual therapy, Patient/Family education, and Biofeedback  PLAN FOR NEXT SESSION: urge to void technique to see how it is going; review bladder diary,  abdominal contraction, core strength with pelvic floor contraction   Channing Pereyra, PT 08/07/23 3:20 PM

## 2023-08-07 NOTE — Patient Instructions (Addendum)
 Urge Incontinence  Ideal urination frequency is every 2-4 wakeful hours, which equates to 5-8 times within a 24-hour period.   Urge incontinence is leakage that occurs when the bladder muscle contracts, creating a sudden need to go before getting to the bathroom.   Going too often when your bladder isn't actually full can disrupt the body's automatic signals to store and hold urine longer, which will increase urgency/frequency.  In this case, the bladder "is running the show" and strategies can be learned to retrain this pattern.   One should be able to control the first urge to urinate, at around .  The bladder can hold up to a "grande latte," or . To help you gain control, practice the Urge Drill below when urgency strikes.  This drill will help retrain your bladder signals and allow you to store and hold urine longer.  The overall goal is to stretch out your time between voids to reach a more manageable voiding schedule.    Practice your quick flicks often throughout the day (each waking hour) even when you don't need feel the urge to go.  This will help strengthen your pelvic floor muscles, making them more effective in controlling leakage.  Urge Drill  When you feel an urge to go, follow these steps to regain control: Stop what you are doing and be still Take one deep breath, directing your air into your abdomen Think an affirming thought, such as "I've got this." Do 5 quick flicks of your pelvic floor Walk with control to the bathroom to void, or delay voiding   During the day try to wait 1 hour bbefore you have to go to the bathroom Stop drinking fluids after 6 PM When you go to bed lay down on back  with legs on pillow for 30 minutes daily  Channing Pereyra, PT [facility] [address] [place], [place] [address] W: [phone] [email or web address]

## 2023-08-08 ENCOUNTER — Telehealth: Payer: Self-pay | Admitting: *Deleted

## 2023-08-08 MED ORDER — CIPROFLOXACIN HCL 250 MG PO TABS: 250.0000 mg | ORAL_TABLET | Freq: Two times a day (BID) | ORAL | 0 refills | Status: DC

## 2023-08-08 NOTE — Telephone Encounter (Signed)
 Rx done per results note-unable to send under results note.

## 2023-08-10 NOTE — Progress Notes (Signed)
 Subjective:   Patient ID: Emily Cannon, female   DOB: 89 y.o.   MRN: [id number]   HPI Patient presents with chronic thick yellow brittle nailbeds 1-5 both feet that become painful she cannot cut   ROS      Objective:  Physical Exam  Neurovascular status intact with thick yellow brittle nailbeds 1-5 both feet painful     Assessment:  Chronic mycotic nail infection with pain 1-5 both feet     Plan:  H&P reviewed debrided nailbeds 1-5 both feet no iatrogenic bleeding reappoint routine care

## 2023-08-14 ENCOUNTER — Encounter: Payer: Medicare Other | Admitting: Physical Therapy

## 2023-08-14 ENCOUNTER — Encounter: Payer: Self-pay | Admitting: Physical Therapy

## 2023-08-14 DIAGNOSIS — M6281 Muscle weakness (generalized): Secondary | ICD-10-CM

## 2023-08-14 DIAGNOSIS — R3915 Urgency of urination: Secondary | ICD-10-CM

## 2023-08-14 DIAGNOSIS — N3941 Urge incontinence: Secondary | ICD-10-CM | POA: Diagnosis not present

## 2023-08-14 NOTE — Therapy (Signed)
OUTPATIENT PHYSICAL THERAPY FEMALE PELVIC TREATMENT   Patient Name: Emily Cannon MRN: [id number] DOB:07/13/1933, 89 y.o., female Today's Date: 08/14/2023  END OF SESSION:  PT End of Session - 08/14/23 1407     Visit Number 4    Authorization Type Medicare    Authorization - Number of Visits 4    Progress Note Due on Visit 10    PT Start Time 1400    PT Stop Time 1445    PT Time Calculation (min) 45 min    Activity Tolerance Patient tolerated treatment well    Behavior During Therapy WFL for tasks assessed/performed             Past Medical History:  Diagnosis Date   Allergic rhinitis, cause unspecified    Allergy    Anxiety state, unspecified    Blood transfusion without reported diagnosis    2020   Cataract    removed both eyes    Chronic kidney disease    Disorder of bone and cartilage, unspecified    Esophageal candidiasis (HCC)    Foot drop    Gastric ulcer    History of CMV    History of Helicobacter pylori infection    Hypothyroid    Irritable bowel syndrome    Lumbago    Microscopic polyangiitis (HCC)    Other and unspecified hyperlipidemia    Pancreatic cyst    Perforation of tympanic membrane, unspecified    Unspecified essential hypertension    Past Surgical History:  Procedure Laterality Date   ABDOMINAL HYSTERECTOMY     BIOPSY  01/02/2019   Procedure: BIOPSY;  Surgeon: Benancio Deeds, MD;  Location: [facility] ENDOSCOPY;  Service: Gastroenterology;;   cataract surgery  5/12 and 12/24/2012   both eyes   COLONOSCOPY     COLONOSCOPY WITH PROPOFOL N/A 01/02/2019   Procedure: COLONOSCOPY WITH PROPOFOL;  Surgeon: Benancio Deeds, MD;  Location: [facility] ENDOSCOPY;  Service: Gastroenterology;  Laterality: N/A;   ESOPHAGOGASTRODUODENOSCOPY (EGD) WITH PROPOFOL N/A 01/02/2019   Procedure: ESOPHAGOGASTRODUODENOSCOPY (EGD) WITH PROPOFOL;  Surgeon: Benancio Deeds, MD;  Location: [facility] ENDOSCOPY;  Service: Gastroenterology;  Laterality: N/A;   NSVD     x1    POLYPECTOMY  01/02/2019   Procedure: POLYPECTOMY;  Surgeon: Benancio Deeds, MD;  Location: [facility] ENDOSCOPY;  Service: Gastroenterology;;   POLYPECTOMY     TONSILLECTOMY     UPPER GASTROINTESTINAL ENDOSCOPY     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) 09/25/2020   Peripheral neuropathy 05/08/2020   Weakness 03/28/2020   Gait abnormality 03/28/2020   Paresthesia 03/28/2020   Left foot drop 03/23/2020   Heme positive stool    Benign neoplasm of colon    Acute blood loss anemia 12/30/2018   Symptomatic anemia 12/15/2018   AKI (acute kidney injury) (HCC) 12/15/2018   Hypomagnesemia 12/15/2018   Steroid-induced hyperglycemia 12/15/2018   Microscopic polyangiitis (HCC) 11/13/2018   Positive P-ANCA titer 10/14/2018   Viral URI 05/12/2018   Syncope 06/05/2016   Venous (peripheral) insufficiency 11/23/2012   Dermatitis 11/10/2012   Hypothyroidism 05/22/2011   Perforation of tympanic membrane 08/08/2007   Osteopenia 07/27/2007   Hyperlipidemia 05/11/2007   Anxiety state 05/11/2007   Essential hypertension 05/11/2007   Allergic rhinitis 05/11/2007  Irritable bowel syndrome 05/11/2007   LOW BACK PAIN 05/11/2007   Acquired absence of genital organ 05/11/2007    PCP: Kristian Covey, MD  REFERRING PROVIDER: Loleta Chance, MD   REFERRING DIAG:  N39.41 (ICD-10-CM) - Urge urinary incontinence  R35.1 (ICD-10-CM) - Nocturia    THERAPY DIAG:  Muscle weakness (generalized)  Urinary urgency  Rationale for Evaluation and Treatment: Rehabilitation  ONSET DATE: 2022  SUBJECTIVE:                                                                                                                                                                                            SUBJECTIVE STATEMENT: I get leg cramps when doing the exercise. I am still getting the burning with urination is less with using the moisturizing cream.  I am still urinating a lot.  Fluid intake:  36oz water per day, 4 cups of coffee intake    PAIN:  Are you having pain? Yes NPRS scale: /10 Pain location: Vaginal  Pain type: burning Pain description: intermittent   Aggravating factors: when urinating Relieving factors: not urinating  PRECAUTIONS: None  RED FLAGS: None   WEIGHT BEARING RESTRICTIONS: No  FALLS:  Has patient fallen in last 6 months? No  LIVING ENVIRONMENT: Lives with: lives with their spouse   OCCUPATION: retired  PLOF: Independent  PATIENT GOALS: reduce the urinary leakage and reduce the number pads.   PERTINENT HISTORY:  Ambulates with a cane with leg braces for foot drop since hospitalization for Wegener granulomatosis diagnosis in 2020 managed by rheumatology ; IBS; Hypothyroid; Abdominal hysterectomy;    URINATION: Pain with urination: Yes Fully empty bladder: sit to lean forward to fully empty her bladder;  dribbling after finishing, and the need to urinate multiple times in a row  Stream: Weak Urgency: Yes: while walking to the bathroom and while she is getting her underwear off Frequency: Day time voids >15.  Nocturia: 2-5 times per night to void.  Leakage: Urge to void, Walking to the bathroom, Coughing, Sneezing, Laughing, Exercise, and going from sitting to  standing Pads: 3-4 adult diapers per day    PREGNANCY: Vaginal deliveries 1 Tearing No  PROLAPSE: None   OBJECTIVE:  Note: Objective measures were completed at Evaluation unless otherwise noted.  DIAGNOSTIC FINDINGS:  Pelvic floor strength V/V, puborectalis V/V  Pelvic floor musculature: Right levator tender, Right obturator non-tender, Left levator non-tender, Left obturator non-tender  Post Void Residual 47 mL     COGNITION: Overall  cognitive status: Within functional limits for tasks assessed      GAIT:  Assistive device utilized: Environmental consultant - 4 wheeled  Level of assistance: Complete Independence Comments: She wears braces on her lower legs  POSTURE: No Significant postural limitations  PELVIC ALIGNMENT:   LOWER EXTREMITY ROM:  Passive ROM Right eval Left eval  Hip internal rotation 10 20  Hip external rotation 35 30   (Blank rows = not tested)  LOWER EXTREMITY MMT:  MMT Right eval Left eval  Hip extension 4/5 4/5  Hip abduction 3+/5 3+/5   PALPATION:   General  right lower quadrant tenderness; Patient will bulge the lower abdomen when contracting, patient will bulge her abdomen when contracting the pelvic floor.              Patient confirms identification and approves PT to assess internal pelvic floor and treatment No         TODAY'S TREATMENT:   08/14/23 Exercises: Strengthening: ( all exercises engage the core and pelvic floor)  Supine transverse abdominus 10 x  Supine marching 10 x each leg Supine knee fall out with yellow band 10 x each leg Lay on side and press into yoga block with pelvic floor contraction 15 X Standing marching  Standing hip abduction  Standing hip extension with gluteal squeeze  Therapeutic activities: Functional strengthening activities: Reviewed bladder diary and saw patient is able to wait 1.5 to 2 hours to urinate, she gets up 4 times at night to urinate, She is going to miss the 2 AM time and go back to sleep to see if she is able to deter the urge.         08/07/23 Exercises: Stretches/mobility: Reviewed the use of the vaginal moisturizers, where to place it and use daily. She is feeling better with the cream.  Strengthening: Sitting pelvic floor contraction 10 x  Therapeutic activities: Functional strengthening activities: Educated patient and husband on urge to void technique to be used during the day and at night to reduce the amount of times to go to  the bathroom. Her goal is to wait every hour to urinate during the day and reduce the amount of urinating to 3 times per night compared to 6. Discussed ways for patient to deter the  urge to urinate with doing her crossword puzzles, calling someone, or talking to her husband to get her mind off the urge to urinate. Had patient to practice her pelvic floor contractions sitting and feeling the vagina lift off the chair without contracting her gluteals. Marching in place in standing and sitting to deter the urge to void by disrupting the Bradleys loop. Patient needed many verbal cues and have the therapist repeat the instructions several times.  Therapist explained to the patient and her husband on how to fill out the bladder diary to see how often she is voiding, how much she is drinking, how often she gets the urge to void and when. She needed instructions repeated several times.      07/31/23 Exercises: Stretches/mobility: Educated patient on vaginal moisturizers for daily use at night , ways to take care of the vulva area Strengthening: Supine ball squeeze with pelvic floor contraction 20 x and tactile cues to assist her with contracting the pelvic floor Ball squeeze with pelvic floor contraction and doing a bridge 20 x  Supine hip flexion isometric 10 x each leg to feel the lower abdominals contract and pelvic floor Sitting pelvic floor contraction with ball squeeze holding 3 sec 20 x Sitting shoulder protraction with yellow band and ball squeeze Sitting ball squeeze with elbow flexion using yellow band 2  x 10                                                                                                                            PATIENT EDUCATION:  08/07/23 Education details: Access Code: [email or web address], how to fill out bladder diary, urge to void when have the urge to urinate Person educated: Patient Education method: Explanation, Demonstration, Tactile cues, Verbal cues, and Handouts Education  comprehension: verbalized understanding, returned demonstration, verbal cues required, tactile cues required, and needs further education  HOME EXERCISE PROGRAM: 07/31/23 Access Code: [email or web address] URL: [email or web address] Date: 07/31/2023 Prepared by: Eulis Foster  Program Notes use the estrogen cream around the urethra  Exercises - Hooklying Transversus Abdominis Palpation  - 1 x daily - 7 x weekly - 1 sets - 10 reps - Supine Bridge with Mini Swiss Ball Between Knees  - 1 x daily - 7 x weekly - 1 sets - 10 reps - Hooklying Isometric Hip Flexion  - 1 x daily - 7 x weekly - 2 sets - 10 reps - Seated Hip Adduction Squeeze with Ball  - 1 x daily - 7 x weekly - 1 sets - 10 reps - 3 sec hold - Seated Abdominal Press into [employer]  - 1 x daily - 3 x weekly - 1 sets - 10 reps - 3 sec hold - Seated Scapular Protraction with Resistance  - 1 x daily - 3 x weekly - 2 sets - 10 reps - Seated Elbow Flexion with Resistance Under Foot  - 1 x daily - 3 x weekly - 2 sets - 10 reps  ASSESSMENT:  CLINICAL IMPRESSION: Patient is a 89 y.o. female  who was seen today for physical therapy  treatment for urge incontinence and nocturia. Patient is going to the bathroom every 1.5 to 2 hours during the day and every 2 hours during the night. She is going to try not go to the bathroom at 2 AM and go back to sleep using the urge to void technique to see how it goes.  She is contracting the lower abdomen better with her exercises and feels the pelvic floor contract. Patient needs skilled therapy to improve pelvic floor strength and coordination to reduce urinary leakage.   OBJECTIVE IMPAIRMENTS: decreased coordination, decreased strength, and pain.   ACTIVITY LIMITATIONS: continence, toileting, and locomotion level  PARTICIPATION LIMITATIONS:  home life  PERSONAL FACTORS: Age, Fitness, and 3+ comorbidities: Ambulates with a cane with leg braces for foot drop since hospitalization for Wegener  granulomatosis diagnosis in 2020 managed by rheumatology ; IBS; Hypothyroid; Abdominal hysterectomy;   are also affecting patient's functional outcome.   REHAB POTENTIAL: Good  CLINICAL DECISION MAKING: Evolving/moderate complexity  EVALUATION COMPLEXITY: Moderate   GOALS: Goals reviewed with patient? Yes  SHORT TERM GOALS: Target date: 08/21/23  Patient is independent with initial HEP for pelvic floor and core.  Baseline: Goal status: Met 08/07/23  2.  Patient is educated on urge to  void to deter urination.  Baseline:  Goal status: Met 08/07/23  3.  Patient is able to wait 30-45 minutes to urinate while at home.  Baseline:  Goal status: Met 08/14/23   LONG TERM GOALS: Target date: 09/18/23  Patient is independent with advanced HEP for pelvic floor and core strength.  Baseline:  Goal status: INITIAL  2.  Patient reports she is able to wait >/= 1.5 hours to urinate when at home during the day due to improved coordination of the pelvic floor.  Baseline:  Goal status: Met 08/14/23  3.  Patient is able to urinate during the night </= 2 times per night due to the ability to use the urge to void behavioral technique.  Baseline:  Goal status: INITIAL  4.  Patient is able to wear 1 pull up per day and 1 per night due to her reduction of urinary leakage.  Baseline:  Goal status: INITIAL  5.  Burning during urinate decreased >/= 25% due to the use of her estrogen cream and not wearing as many pull ups.  Baseline:  Goal status: INITIAL   PLAN:  PT FREQUENCY: 1x/week  PT DURATION: 8 weeks  PLANNED INTERVENTIONS: 97110-Therapeutic exercises, 97530- Therapeutic activity, 97112- Neuromuscular re-education, 97140- Manual therapy, Patient/Family education, and Biofeedback  PLAN FOR NEXT SESSION: see how it goes without going to the bathroom at 2 AM  abdominal contraction, core strength with pelvic floor contraction   Eulis Foster, PT 08/14/23 3:06 PM

## 2023-08-15 ENCOUNTER — Other Ambulatory Visit: Payer: Self-pay | Admitting: Internal Medicine

## 2023-08-15 DIAGNOSIS — R300 Dysuria: Secondary | ICD-10-CM

## 2023-08-18 ENCOUNTER — Other Ambulatory Visit: Payer: Self-pay | Admitting: Family Medicine

## 2023-08-18 NOTE — Telephone Encounter (Signed)
Okay to refill per Dr. Snider.   Charlene Detter D Nicholous Girgenti, RN  

## 2023-08-21 ENCOUNTER — Encounter: Payer: Medicare Other | Admitting: Physical Therapy

## 2023-08-21 ENCOUNTER — Encounter: Payer: Self-pay | Admitting: Physical Therapy

## 2023-08-21 DIAGNOSIS — M6281 Muscle weakness (generalized): Secondary | ICD-10-CM

## 2023-08-21 DIAGNOSIS — R3915 Urgency of urination: Secondary | ICD-10-CM

## 2023-08-21 DIAGNOSIS — N3941 Urge incontinence: Secondary | ICD-10-CM | POA: Diagnosis not present

## 2023-08-21 NOTE — Therapy (Signed)
OUTPATIENT PHYSICAL THERAPY FEMALE PELVIC TREATMENT   Patient Name: Emily Cannon MRN: [id number] DOB:[birth year], 89 y.o., female Today's Date: 08/21/2023  END OF SESSION:  PT End of Session - 08/21/23 1510     Visit Number 5    Date for PT Re-Evaluation 09/18/23    Authorization Type Medicare    Authorization - Number of Visits 5    Progress Note Due on Visit 10    PT Start Time 1500    PT Stop Time 1545    PT Time Calculation (min) 45 min    Activity Tolerance Patient tolerated treatment well    Behavior During Therapy WFL for tasks assessed/performed             Past Medical History:  Diagnosis Date   Allergic rhinitis, cause unspecified    Allergy    Anxiety state, unspecified    Blood transfusion without reported diagnosis    2020   Cataract    removed both eyes    Chronic kidney disease    Disorder of bone and cartilage, unspecified    Esophageal candidiasis (HCC)    Foot drop    Gastric ulcer    History of CMV    History of Helicobacter pylori infection    Hypothyroid    Irritable bowel syndrome    Lumbago    Microscopic polyangiitis (HCC)    Other and unspecified hyperlipidemia    Pancreatic cyst    Perforation of tympanic membrane, unspecified    Unspecified essential hypertension    Past Surgical History:  Procedure Laterality Date   ABDOMINAL HYSTERECTOMY     BIOPSY  01/02/2019   Procedure: BIOPSY;  Surgeon: Benancio Deeds, MD;  Location: [facility] ENDOSCOPY;  Service: Gastroenterology;;   cataract surgery  5/12 and 12/24/2012   both eyes   COLONOSCOPY     COLONOSCOPY WITH PROPOFOL N/A 01/02/2019   Procedure: COLONOSCOPY WITH PROPOFOL;  Surgeon: Benancio Deeds, MD;  Location: [facility] ENDOSCOPY;  Service: Gastroenterology;  Laterality: N/A;   ESOPHAGOGASTRODUODENOSCOPY (EGD) WITH PROPOFOL N/A 01/02/2019   Procedure: ESOPHAGOGASTRODUODENOSCOPY (EGD) WITH PROPOFOL;  Surgeon: Benancio Deeds, MD;  Location: [facility] ENDOSCOPY;  Service:  Gastroenterology;  Laterality: N/A;   NSVD     x1   POLYPECTOMY  01/02/2019   Procedure: POLYPECTOMY;  Surgeon: Benancio Deeds, MD;  Location: [facility] ENDOSCOPY;  Service: Gastroenterology;;   POLYPECTOMY     TONSILLECTOMY     UPPER GASTROINTESTINAL ENDOSCOPY     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) 09/25/2020   Peripheral neuropathy 05/08/2020   Weakness 03/28/2020   Gait abnormality 03/28/2020   Paresthesia 03/28/2020   Left foot drop 03/23/2020   Heme positive stool    Benign neoplasm of colon    Acute blood loss anemia 12/30/2018   Symptomatic anemia 12/15/2018   AKI (acute kidney injury) (HCC) 12/15/2018   Hypomagnesemia 12/15/2018   Steroid-induced hyperglycemia 12/15/2018   Microscopic polyangiitis (HCC) 11/13/2018   Positive P-ANCA titer 10/14/2018   Viral URI 05/12/2018   Syncope 06/05/2016   Venous (peripheral) insufficiency 11/23/2012   Dermatitis 11/10/2012   Hypothyroidism 05/22/2011   Perforation of tympanic membrane 08/08/2007   Osteopenia 07/27/2007   Hyperlipidemia 05/11/2007   Anxiety state 05/11/2007   Essential  hypertension 05/11/2007   Allergic rhinitis 05/11/2007   Irritable bowel syndrome 05/11/2007   LOW BACK PAIN 05/11/2007   Acquired absence of genital organ 05/11/2007    PCP: Kristian Covey, MD  REFERRING PROVIDER: Loleta Chance, MD   REFERRING DIAG:  N39.41 (ICD-10-CM) - Urge urinary incontinence  R35.1 (ICD-10-CM) - Nocturia    THERAPY DIAG:  Muscle weakness (generalized)  Urinary urgency  Rationale for Evaluation and Treatment: Rehabilitation  ONSET DATE: 2022  SUBJECTIVE:                                                                                                                                                                                            SUBJECTIVE STATEMENT: Patient woke up 3 times and was able to get to the bathroom without leaking. Burning pain is 60% better and less often.  Fluid intake:  36oz water per day, 4 cups of coffee intake    PAIN:  Are you having pain? Yes NPRS scale: 5/10 Pain location: Vaginal  Pain type: burning Pain description: intermittent   Aggravating factors: when urinating Relieving factors: not urinating  PRECAUTIONS: None  RED FLAGS: None   WEIGHT BEARING RESTRICTIONS: No  FALLS:  Has patient fallen in last 6 months? No  LIVING ENVIRONMENT: Lives with: lives with their spouse   OCCUPATION: retired  PLOF: Independent  PATIENT GOALS: reduce the urinary leakage and reduce the number pads.   PERTINENT HISTORY:  Ambulates with a cane with leg braces for foot drop since hospitalization for Wegener granulomatosis diagnosis in 2020 managed by rheumatology ; IBS; Hypothyroid; Abdominal hysterectomy;    URINATION: Pain with urination: Yes Fully empty bladder: sit to lean forward to fully empty her bladder;  dribbling after finishing, and the need to urinate multiple times in a row  Stream: Weak Urgency: Yes: while walking to the bathroom and while she is getting her underwear off Frequency: Day time voids >15.  Nocturia: 2-5 times per night to void.  Leakage: Urge to void, Walking to the bathroom, Coughing, Sneezing, Laughing, Exercise, and going from sitting to  standing Pads: 3-4 adult diapers per day    PREGNANCY: Vaginal deliveries 1 Tearing No  PROLAPSE: None   OBJECTIVE:  Note: Objective measures were completed at Evaluation unless otherwise noted.  DIAGNOSTIC FINDINGS:  Pelvic floor strength V/V, puborectalis V/V  Pelvic floor musculature: Right levator tender, Right obturator non-tender, Left levator non-tender, Left obturator non-tender  Post Void Residual 47 mL      COGNITION: Overall cognitive status: Within functional limits for tasks assessed      GAIT:  Assistive device utilized: Environmental consultant -  4 wheeled Level of assistance: Complete Independence Comments: She wears braces on her lower legs  POSTURE: No Significant postural limitations  PELVIC ALIGNMENT:   LOWER EXTREMITY ROM:  Passive ROM Right eval Left eval  Hip internal rotation 10 20  Hip external rotation 35 30   (Blank rows = not tested)  LOWER EXTREMITY MMT:  MMT Right eval Left eval Right/left 08/21/23  Hip extension 4/5 4/5 4/5  Hip abduction 3+/5 3+/5 3+/5   PALPATION:   General  right lower quadrant tenderness; Patient will bulge the lower abdomen when contracting, patient will bulge her abdomen when contracting the pelvic floor.              Patient confirms identification and approves PT to assess internal pelvic floor and treatment No         TODAY'S TREATMENT:   08/21/23 Exercises: Strengthening: Supine marching 10 x 3 each leg with yellow band around knees Supine knee fall out with red band 10 x  2 each leg Standing marching 3 x 10 Standing hip abduction 3 x 10 Standing hip extension with gluteal squeeze 3 x 10 Therapeutic activities: Functional strengthening activities: Discussed with patient on going to the bathroom at night and trying to miss one of the times to urinate   08/14/23 Exercises: Strengthening: ( all exercises engage the core and pelvic floor)  Supine transverse abdominus 10 x  Supine marching 10 x each leg Supine knee fall out with yellow band 10 x each leg Lay on side and press into yoga block with pelvic floor contraction 15 X Standing marching  Standing hip abduction  Standing hip extension with gluteal squeeze  Therapeutic activities: Functional strengthening activities: Reviewed bladder diary and saw patient is able to wait 1.5 to 2 hours to urinate, she gets up 4 times at night to urinate, She is going to miss the 2 AM time  and go back to sleep to see if she is able to deter the urge.         08/07/23 Exercises: Stretches/mobility: Reviewed the use of the vaginal moisturizers, where to place it and use daily. She is feeling better with the cream.  Strengthening: Sitting pelvic floor contraction 10 x  Therapeutic activities: Functional strengthening activities: Educated patient and husband on urge to void technique to be used during the day and at night to reduce the amount of times to go to the bathroom. Her goal is to wait every hour to urinate during the day and reduce the amount of urinating to 3 times per night compared to 6. Discussed ways for patient to deter the  urge to urinate with doing her crossword puzzles, calling someone, or talking to her husband to get her mind off the urge to urinate. Had patient to practice her pelvic floor contractions sitting and feeling the vagina lift off the chair without contracting her gluteals. Marching in place in standing and sitting to deter the urge to void by disrupting the Bradleys loop. Patient needed many verbal cues and have the therapist repeat the instructions several times.  Therapist explained to the patient and her husband on how to fill out the bladder diary to see how often she is voiding, how much she is drinking, how often she gets the urge to void and when. She needed instructions repeated several times.      07/31/23 Exercises: Stretches/mobility: Educated patient on vaginal moisturizers for daily use at night , ways to take care of the vulva area Strengthening:  Supine ball squeeze with pelvic floor contraction 20 x and tactile cues to assist her with contracting the pelvic floor Ball squeeze with pelvic floor contraction and doing a bridge 20 x  Supine hip flexion isometric 10 x each leg to feel the lower abdominals contract and pelvic floor Sitting pelvic floor contraction with ball squeeze holding 3 sec 20 x Sitting shoulder protraction with yellow  band and ball squeeze Sitting ball squeeze with elbow flexion using yellow band 2 x 10                                                                                                                            PATIENT EDUCATION:  08/07/23 Education details: Access Code: [email or web address], how to fill out bladder diary, urge to void when have the urge to urinate Person educated: Patient Education method: Explanation, Demonstration, Tactile cues, Verbal cues, and Handouts Education comprehension: verbalized understanding, returned demonstration, verbal cues required, tactile cues required, and needs further education  HOME EXERCISE PROGRAM: 07/31/23 Access Code: [email or web address] URL: [email or web address] Date: 07/31/2023 Prepared by: Eulis Foster  Program Notes use the estrogen cream around the urethra  Exercises - Hooklying Transversus Abdominis Palpation  - 1 x daily - 7 x weekly - 1 sets - 10 reps - Supine Bridge with Mini Swiss Ball Between Knees  - 1 x daily - 7 x weekly - 1 sets - 10 reps - Hooklying Isometric Hip Flexion  - 1 x daily - 7 x weekly - 2 sets - 10 reps - Seated Hip Adduction Squeeze with Ball  - 1 x daily - 7 x weekly - 1 sets - 10 reps - 3 sec hold - Seated Abdominal Press into [employer]  - 1 x daily - 3 x weekly - 1 sets - 10 reps - 3 sec hold - Seated Scapular Protraction with Resistance  - 1 x daily - 3 x weekly - 2 sets - 10 reps - Seated Elbow Flexion with Resistance Under Foot  - 1 x daily - 3 x weekly - 2 sets - 10 reps  ASSESSMENT:  CLINICAL IMPRESSION: Patient is a 89 y.o. female  who was seen today for physical therapy  treatment for urge incontinence and nocturia. Patient woke up 3 times and was able to get to the bathroom without leaking. Burning pain is 60% better and less often. She is getting stronger due ot using the red band compared to the yellow band. Patient needs skilled therapy to improve pelvic floor strength and coordination to reduce urinary  leakage.   OBJECTIVE IMPAIRMENTS: decreased coordination, decreased strength, and pain.   ACTIVITY LIMITATIONS: continence, toileting, and locomotion level  PARTICIPATION LIMITATIONS:  home life  PERSONAL FACTORS: Age, Fitness, and 3+ comorbidities: Ambulates with a cane with leg braces for foot drop since hospitalization for Wegener granulomatosis diagnosis in 2020 managed by rheumatology ; IBS; Hypothyroid; Abdominal hysterectomy;   are also affecting patient's functional outcome.   REHAB POTENTIAL: Good  CLINICAL DECISION MAKING: Evolving/moderate complexity  EVALUATION COMPLEXITY: Moderate   GOALS: Goals reviewed with patient? Yes  SHORT TERM GOALS: Target date: 08/21/23  Patient is independent with initial HEP for pelvic floor and core.  Baseline: Goal status: Met 08/07/23  2.  Patient is educated on urge to void to deter urination.  Baseline:  Goal status: Met 08/07/23  3.  Patient is able to wait 30-45 minutes to urinate while at home.  Baseline:  Goal status: Met 08/14/23   LONG TERM GOALS: Target date: 09/18/23  Patient is independent with advanced HEP for pelvic floor and core strength.  Baseline:  Goal status: INITIAL  2.  Patient reports she is able to wait >/= 1.5 hours to urinate when at home during the day due to improved coordination of the pelvic floor.  Baseline:  Goal status: Met 08/14/23  3.  Patient is able to urinate during the night </= 2 times per night due to the ability to use the urge to void behavioral technique.  Baseline:  Goal status: INITIAL  4.  Patient is able to wear 1 pull up per day and 1 per night due to her reduction of urinary leakage.  Baseline:  Goal status: INITIAL  5.  Burning during urinate decreased >/= 25% due to the use of her estrogen cream and not wearing as many pull ups.  Baseline:  Goal status: INITIAL   PLAN:  PT FREQUENCY: 1x/week  PT DURATION: 8 weeks  PLANNED INTERVENTIONS: 97110-Therapeutic exercises,  97530- Therapeutic activity, 97112- Neuromuscular re-education, 97140- Manual therapy, Patient/Family education, and Biofeedback  PLAN FOR NEXT SESSION: see how it goes without going to the bathroom at 2 AM  abdominal contraction, core strength with pelvic floor contraction   Eulis Foster, PT 08/21/23 3:57 PM

## 2023-08-26 ENCOUNTER — Encounter: Payer: Medicare Other | Admitting: Physical Therapy

## 2023-08-27 ENCOUNTER — Ambulatory Visit: Payer: Self-pay | Admitting: Physical Therapy

## 2023-08-27 ENCOUNTER — Encounter: Payer: Self-pay | Admitting: Physical Therapy

## 2023-08-27 DIAGNOSIS — M6281 Muscle weakness (generalized): Secondary | ICD-10-CM

## 2023-08-27 DIAGNOSIS — R3915 Urgency of urination: Secondary | ICD-10-CM

## 2023-08-27 DIAGNOSIS — N3941 Urge incontinence: Secondary | ICD-10-CM | POA: Diagnosis not present

## 2023-08-27 NOTE — Therapy (Signed)
OUTPATIENT PHYSICAL THERAPY FEMALE PELVIC TREATMENT   Patient Name: Emily Cannon MRN: [id number] DOB:19-Jun-1933, 89 y.o., female Today's Date: 08/27/2023  END OF SESSION:  PT End of Session - 08/27/23 1446     Visit Number 6    Date for PT Re-Evaluation 09/18/23    Authorization Type Medicare    Authorization - Number of Visits 5    Progress Note Due on Visit 10    PT Start Time 1400    PT Stop Time 1440    PT Time Calculation (min) 40 min    Activity Tolerance Patient tolerated treatment well    Behavior During Therapy WFL for tasks assessed/performed              Past Medical History:  Diagnosis Date   Allergic rhinitis, cause unspecified    Allergy    Anxiety state, unspecified    Blood transfusion without reported diagnosis    2020   Cataract    removed both eyes    Chronic kidney disease    Disorder of bone and cartilage, unspecified    Esophageal candidiasis (HCC)    Foot drop    Gastric ulcer    History of CMV    History of Helicobacter pylori infection    Hypothyroid    Irritable bowel syndrome    Lumbago    Microscopic polyangiitis (HCC)    Other and unspecified hyperlipidemia    Pancreatic cyst    Perforation of tympanic membrane, unspecified    Unspecified essential hypertension    Past Surgical History:  Procedure Laterality Date   ABDOMINAL HYSTERECTOMY     BIOPSY  01/02/2019   Procedure: BIOPSY;  Surgeon: Benancio Deeds, MD;  Location: [facility] ENDOSCOPY;  Service: Gastroenterology;;   cataract surgery  5/12 and 12/24/2012   both eyes   COLONOSCOPY     COLONOSCOPY WITH PROPOFOL N/A 01/02/2019   Procedure: COLONOSCOPY WITH PROPOFOL;  Surgeon: Benancio Deeds, MD;  Location: [facility] ENDOSCOPY;  Service: Gastroenterology;  Laterality: N/A;   ESOPHAGOGASTRODUODENOSCOPY (EGD) WITH PROPOFOL N/A 01/02/2019   Procedure: ESOPHAGOGASTRODUODENOSCOPY (EGD) WITH PROPOFOL;  Surgeon: Benancio Deeds, MD;  Location: [facility] ENDOSCOPY;  Service:  Gastroenterology;  Laterality: N/A;   NSVD     x1   POLYPECTOMY  01/02/2019   Procedure: POLYPECTOMY;  Surgeon: Benancio Deeds, MD;  Location: [facility] ENDOSCOPY;  Service: Gastroenterology;;   POLYPECTOMY     TONSILLECTOMY     UPPER GASTROINTESTINAL ENDOSCOPY     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) 09/25/2020   Peripheral neuropathy 05/08/2020   Weakness 03/28/2020   Gait abnormality 03/28/2020   Paresthesia 03/28/2020   Left foot drop 03/23/2020   Heme positive stool    Benign neoplasm of colon    Acute blood loss anemia 12/30/2018   Symptomatic anemia 12/15/2018   AKI (acute kidney injury) (HCC) 12/15/2018   Hypomagnesemia 12/15/2018   Steroid-induced hyperglycemia 12/15/2018   Microscopic polyangiitis (HCC) 11/13/2018   Positive P-ANCA titer 10/14/2018   Viral URI 05/12/2018   Syncope 06/05/2016   Venous (peripheral) insufficiency 11/23/2012   Dermatitis 11/10/2012   Hypothyroidism 05/22/2011   Perforation of tympanic membrane 08/08/2007   Osteopenia 07/27/2007   Hyperlipidemia 05/11/2007   Anxiety state 05/11/2007  Essential hypertension 05/11/2007   Allergic rhinitis 05/11/2007   Irritable bowel syndrome 05/11/2007   LOW BACK PAIN 05/11/2007   Acquired absence of genital organ 05/11/2007    PCP: Kristian Covey, MD  REFERRING PROVIDER: Loleta Chance, MD   REFERRING DIAG:  N39.41 (ICD-10-CM) - Urge urinary incontinence  R35.1 (ICD-10-CM) - Nocturia    THERAPY DIAG:  Muscle weakness (generalized)  Urinary urgency  Rationale for Evaluation and Treatment: Rehabilitation  ONSET DATE: 2022  SUBJECTIVE:                                                                                                                                                                                            SUBJECTIVE STATEMENT: Patient is still voiding 3x/night. She is still voiding every 2 hrs during the day, and she is able to control urine until she gets her pants down to her knees. At this point in time, she is losing control of urine when sitting down on the toilet.   Fluid intake:  36oz water per day, 4 cups of coffee intake    PAIN:  Are you having pain? Yes NPRS scale: 5/10 Pain location: Vaginal  Pain type: burning Pain description: intermittent   Aggravating factors: when urinating Relieving factors: not urinating  PRECAUTIONS: None  RED FLAGS: None   WEIGHT BEARING RESTRICTIONS: No  FALLS:  Has patient fallen in last 6 months? No  LIVING ENVIRONMENT: Lives with: lives with their spouse   OCCUPATION: retired  PLOF: Independent  PATIENT GOALS: reduce the urinary leakage and reduce the number pads.   PERTINENT HISTORY:  Ambulates with a cane with leg braces for foot drop since hospitalization for Wegener granulomatosis diagnosis in 2020 managed by rheumatology ; IBS; Hypothyroid; Abdominal hysterectomy;    URINATION: Pain with urination: Yes Fully empty bladder: sit to lean forward to fully empty her bladder;  dribbling after finishing, and the need to urinate multiple times in a row  Stream: Weak Urgency: Yes: while walking to the bathroom and while she is getting her underwear off Frequency: Day time voids >15.  Nocturia: 2-5 times per night to void.  Leakage: Urge to void, Walking to the bathroom, Coughing, Sneezing, Laughing, Exercise, and going from sitting to  standing Pads: 3-4 adult diapers per day    PREGNANCY: Vaginal deliveries 1 Tearing No  PROLAPSE: None   OBJECTIVE:  Note: Objective measures were completed at Evaluation unless otherwise noted.  DIAGNOSTIC FINDINGS:  Pelvic floor strength V/V, puborectalis V/V  Pelvic floor musculature: Right  levator tender, Right obturator non-tender, Left levator non-tender, Left obturator non-tender  Post Void Residual 47  mL     COGNITION: Overall cognitive status: Within functional limits for tasks assessed      GAIT:  Assistive device utilized: Walker - 4 wheeled Level of assistance: Complete Independence Comments: She wears braces on her lower legs  POSTURE: No Significant postural limitations  PELVIC ALIGNMENT:   LOWER EXTREMITY ROM:  Passive ROM Right eval Left eval  Hip internal rotation 10 20  Hip external rotation 35 30   (Blank rows = not tested)  LOWER EXTREMITY MMT:  MMT Right eval Left eval Right/left 08/21/23  Hip extension 4/5 4/5 4/5  Hip abduction 3+/5 3+/5 3+/5   PALPATION:   General  right lower quadrant tenderness; Patient will bulge the lower abdomen when contracting, patient will bulge her abdomen when contracting the pelvic floor.              Patient confirms identification and approves PT to assess internal pelvic floor and treatment No         TODAY'S TREATMENT:   08/27/23 Neuro Re-ed:  RUSI in supine with ultrasound head above the pubic bone on the pelvic floor 2 setting, sidelying, and seated: inhalation and lengthening pelvic floor, exhalation and shortening pelvic floor x5 breaths in each position Supine with diaphragmatic breathing to lengthen and contract the pelvic floor with verbal cues to breath out with pursed lips then performed in sidely, sitting, and going from sit to stand. Patient was able to use her visual cues to see the pelvic floor contract and understand the right mechanics.  Therapeutic activities: Functional strengthening activities: Sit to stand with exhalation and pelvic floor contraction x5 - to help with leakage during toilet transfers  08/21/23 Exercises: Strengthening: Supine marching 10 x 3 each leg with yellow band around knees Supine knee fall out with red band 10 x  2 each leg Standing marching 3 x  10 Standing hip abduction 3 x 10 Standing hip extension with gluteal squeeze 3 x 10 Therapeutic activities: Functional strengthening activities: Discussed with patient on going to the bathroom at night and trying to miss one of the times to urinate   08/14/23 Exercises: Strengthening: ( all exercises engage the core and pelvic floor)  Supine transverse abdominus 10 x  Supine marching 10 x each leg Supine knee fall out with yellow band 10 x each leg Lay on side and press into yoga block with pelvic floor contraction 15 X Standing marching  Standing hip abduction  Standing hip extension with gluteal squeeze  Therapeutic activities: Functional strengthening activities: Reviewed bladder diary and saw patient is able to wait 1.5 to 2 hours to urinate, she gets up 4 times at night to urinate, She is going to miss the 2 AM time and go back to sleep to see if she is able to deter the urge.  PATIENT EDUCATION:  08/07/23 Education details: Access Code: [email or web address], how to fill out bladder diary, urge to void when have the urge to urinate Person educated: Patient Education method: Explanation, Demonstration, Tactile cues, Verbal cues, and Handouts Education comprehension: verbalized understanding, returned demonstration, verbal cues required, tactile cues required, and needs further education  HOME EXERCISE PROGRAM: 07/31/23 Access Code: [email or web address] URL: [email or web address] Date: 07/31/2023 Prepared by: Eulis Foster  Program Notes use the estrogen cream around the urethra  Exercises - Hooklying Transversus Abdominis Palpation  - 1 x daily - 7 x weekly - 1 sets - 10 reps - Supine Bridge with Mini Swiss Ball Between Knees  - 1 x daily - 7 x weekly - 1 sets - 10 reps - Hooklying Isometric Hip Flexion  - 1 x daily - 7 x weekly - 2 sets - 10 reps - Seated Hip  Adduction Squeeze with Ball  - 1 x daily - 7 x weekly - 1 sets - 10 reps - 3 sec hold - Seated Abdominal Press into [employer]  - 1 x daily - 3 x weekly - 1 sets - 10 reps - 3 sec hold - Seated Scapular Protraction with Resistance  - 1 x daily - 3 x weekly - 2 sets - 10 reps - Seated Elbow Flexion with Resistance Under Foot  - 1 x daily - 3 x weekly - 2 sets - 10 reps  ASSESSMENT:  CLINICAL IMPRESSION: Patient is a 89 y.o. female who was seen today for physical therapy treatment for urge incontinence and nocturia. Patient woke up 3 times and was able to get to the bathroom without leaking. Patient required consistent verbal/visual/tactile cueing for diaphragmatic breath control while performing pelvic floor active range of motion. She was able to control her pelvic floor while breathing in the following positions: supine, sidelying, and seated. She was taught to engage the pelvic floor when transferring to toilet to prevent leakage at home. Following RUSI training, patient reported a better understanding of the connection between the pelvic floor and core musculature. Patient needs skilled therapy to improve pelvic floor strength and coordination to reduce urinary leakage.   OBJECTIVE IMPAIRMENTS: decreased coordination, decreased strength, and pain.   ACTIVITY LIMITATIONS: continence, toileting, and locomotion level  PARTICIPATION LIMITATIONS:  home life  PERSONAL FACTORS: Age, Fitness, and 3+ comorbidities: Ambulates with a cane with leg braces for foot drop since hospitalization for Wegener granulomatosis diagnosis in 2020 managed by rheumatology ; IBS; Hypothyroid; Abdominal hysterectomy;   are also affecting patient's functional outcome.   REHAB POTENTIAL: Good  CLINICAL DECISION MAKING: Evolving/moderate complexity  EVALUATION COMPLEXITY: Moderate   GOALS: Goals reviewed with patient? Yes  SHORT TERM GOALS: Target date: 08/21/23  Patient is independent with initial HEP for pelvic  floor and core.  Baseline: Goal status: Met 08/07/23  2.  Patient is educated on urge to void to deter urination.  Baseline:  Goal status: Met 08/07/23  3.  Patient is able to wait 30-45 minutes to urinate while at home.  Baseline:  Goal status: Met 08/14/23   LONG TERM GOALS: Target date: 09/18/23  Patient is independent with advanced HEP for pelvic floor and core strength.  Baseline:  Goal status: INITIAL  2.  Patient reports she is able to wait >/= 1.5 hours to urinate when at home during the day due to improved coordination of the pelvic floor.  Baseline:  Goal status: Met 08/14/23  3.  Patient is able to urinate during the night </= 2  times per night due to the ability to use the urge to void behavioral technique.  Baseline:  Goal status: INITIAL  4.  Patient is able to wear 1 pull up per day and 1 per night due to her reduction of urinary leakage.  Baseline:  Goal status: INITIAL  5.  Burning during urinate decreased >/= 25% due to the use of her estrogen cream and not wearing as many pull ups.  Baseline:  Goal status: INITIAL   PLAN:  PT FREQUENCY: 1x/week  PT DURATION: 8 weeks  PLANNED INTERVENTIONS: 97110-Therapeutic exercises, 97530- Therapeutic activity, 97112- Neuromuscular re-education, [id number]- Manual therapy, Patient/Family education, and Biofeedback  PLAN FOR NEXT SESSION: continue RUSI training for pelvic floor with breath training (core and pelvic floor with breath control)    Eulis Foster, PT 08/27/23 2:47 PM  Earna Coder, PT 08/27/23 2:45 PM

## 2023-09-01 ENCOUNTER — Encounter: Payer: Self-pay | Admitting: Physical Therapy

## 2023-09-01 ENCOUNTER — Ambulatory Visit: Payer: Medicare Other | Attending: Obstetrics | Admitting: Physical Therapy

## 2023-09-01 DIAGNOSIS — R3915 Urgency of urination: Secondary | ICD-10-CM | POA: Diagnosis present

## 2023-09-01 DIAGNOSIS — M6281 Muscle weakness (generalized): Secondary | ICD-10-CM | POA: Insufficient documentation

## 2023-09-01 NOTE — Therapy (Signed)
OUTPATIENT PHYSICAL THERAPY FEMALE PELVIC TREATMENT   Patient Name: Emily Cannon MRN: [id number] DOB:1933-01-11, 89 y.o., female Today's Date: 09/01/2023  END OF SESSION:  PT End of Session - 09/01/23 1405     Visit Number 7    Date for PT Re-Evaluation 09/18/23    Authorization Type Medicare    Authorization - Number of Visits 6    Progress Note Due on Visit 10    PT Start Time 1400    PT Stop Time 1440    PT Time Calculation (min) 40 min    Activity Tolerance Patient tolerated treatment well    Behavior During Therapy WFL for tasks assessed/performed              Past Medical History:  Diagnosis Date   Allergic rhinitis, cause unspecified    Allergy    Anxiety state, unspecified    Blood transfusion without reported diagnosis    2020   Cataract    removed both eyes    Chronic kidney disease    Disorder of bone and cartilage, unspecified    Esophageal candidiasis (HCC)    Foot drop    Gastric ulcer    History of CMV    History of Helicobacter pylori infection    Hypothyroid    Irritable bowel syndrome    Lumbago    Microscopic polyangiitis (HCC)    Other and unspecified hyperlipidemia    Pancreatic cyst    Perforation of tympanic membrane, unspecified    Unspecified essential hypertension    Past Surgical History:  Procedure Laterality Date   ABDOMINAL HYSTERECTOMY     BIOPSY  01/02/2019   Procedure: BIOPSY;  Surgeon: Benancio Deeds, MD;  Location: [facility] ENDOSCOPY;  Service: Gastroenterology;;   cataract surgery  5/12 and 12/24/2012   both eyes   COLONOSCOPY     COLONOSCOPY WITH PROPOFOL N/A 01/02/2019   Procedure: COLONOSCOPY WITH PROPOFOL;  Surgeon: Benancio Deeds, MD;  Location: [facility] ENDOSCOPY;  Service: Gastroenterology;  Laterality: N/A;   ESOPHAGOGASTRODUODENOSCOPY (EGD) WITH PROPOFOL N/A 01/02/2019   Procedure: ESOPHAGOGASTRODUODENOSCOPY (EGD) WITH PROPOFOL;  Surgeon: Benancio Deeds, MD;  Location: [facility] ENDOSCOPY;  Service:  Gastroenterology;  Laterality: N/A;   NSVD     x1   POLYPECTOMY  01/02/2019   Procedure: POLYPECTOMY;  Surgeon: Benancio Deeds, MD;  Location: [facility] ENDOSCOPY;  Service: Gastroenterology;;   POLYPECTOMY     TONSILLECTOMY     UPPER GASTROINTESTINAL ENDOSCOPY     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) 09/25/2020   Peripheral neuropathy 05/08/2020   Weakness 03/28/2020   Gait abnormality 03/28/2020   Paresthesia 03/28/2020   Left foot drop 03/23/2020   Heme positive stool    Benign neoplasm of colon    Acute blood loss anemia 12/30/2018   Symptomatic anemia 12/15/2018   AKI (acute kidney injury) (HCC) 12/15/2018   Hypomagnesemia 12/15/2018   Steroid-induced hyperglycemia 12/15/2018   Microscopic polyangiitis (HCC) 11/13/2018   Positive P-ANCA titer 10/14/2018   Viral URI 05/12/2018   Syncope 06/05/2016   Venous (peripheral) insufficiency 11/23/2012   Dermatitis 11/10/2012   Hypothyroidism 05/22/2011   Perforation of tympanic membrane 08/08/2007   Osteopenia 07/27/2007   Hyperlipidemia 05/11/2007   Anxiety state 05/11/2007  Essential hypertension 05/11/2007   Allergic rhinitis 05/11/2007   Irritable bowel syndrome 05/11/2007   LOW BACK PAIN 05/11/2007   Acquired absence of genital organ 05/11/2007    PCP: Kristian Covey, MD  REFERRING PROVIDER: Loleta Chance, MD   REFERRING DIAG:  N39.41 (ICD-10-CM) - Urge urinary incontinence  R35.1 (ICD-10-CM) - Nocturia    THERAPY DIAG:  Muscle weakness (generalized)  Urinary urgency  Rationale for Evaluation and Treatment: Rehabilitation  ONSET DATE: 2022  SUBJECTIVE:                                                                                                                                                                                            SUBJECTIVE STATEMENT: Patient is still voiding 3x/night. She is still voiding every 2 hrs during the day, and she is able to control urine until she gets her pants down to her knees. At this point in time, she is losing control of urine when sitting down on the toilet.   Fluid intake:  36oz water per day, 4 cups of coffee intake    PAIN:  Are you having pain? Yes NPRS scale: 5/10 Pain location: Vaginal  Pain type: burning Pain description: intermittent   Aggravating factors: when urinating Relieving factors: not urinating  PRECAUTIONS: None  RED FLAGS: None   WEIGHT BEARING RESTRICTIONS: No  FALLS:  Has patient fallen in last 6 months? No  LIVING ENVIRONMENT: Lives with: lives with their spouse   OCCUPATION: retired  PLOF: Independent  PATIENT GOALS: reduce the urinary leakage and reduce the number pads.   PERTINENT HISTORY:  Ambulates with a cane with leg braces for foot drop since hospitalization for Wegener granulomatosis diagnosis in 2020 managed by rheumatology ; IBS; Hypothyroid; Abdominal hysterectomy;    URINATION: Pain with urination: Yes Fully empty bladder: sit to lean forward to fully empty her bladder;  dribbling after finishing, and the need to urinate multiple times in a row  Stream: Weak Urgency: Yes: while walking to the bathroom and while she is getting her underwear off Frequency: Day time voids >15.  Nocturia: 2-5 times per night to void.  Leakage: Urge to void, Walking to the bathroom, Coughing, Sneezing, Laughing, Exercise, and going from sitting to  standing Pads: 3-4 adult diapers per day    PREGNANCY: Vaginal deliveries 1 Tearing No  PROLAPSE: None   OBJECTIVE:  Note: Objective measures were completed at Evaluation unless otherwise noted.  DIAGNOSTIC FINDINGS:  Pelvic floor strength V/V, puborectalis V/V  Pelvic floor musculature: Right  levator tender, Right obturator non-tender, Left levator non-tender, Left obturator non-tender  Post Void Residual 47  mL     COGNITION: Overall cognitive status: Within functional limits for tasks assessed      GAIT:  Assistive device utilized: Walker - 4 wheeled Level of assistance: Complete Independence Comments: She wears braces on her lower legs  POSTURE: No Significant postural limitations  PELVIC ALIGNMENT:   LOWER EXTREMITY ROM:  Passive ROM Right eval Left eval  Hip internal rotation 10 20  Hip external rotation 35 30   (Blank rows = not tested)  LOWER EXTREMITY MMT:  MMT Right eval Left eval Right/left 08/21/23  Hip extension 4/5 4/5 4/5  Hip abduction 3+/5 3+/5 3+/5   PALPATION:   General  right lower quadrant tenderness; Patient will bulge the lower abdomen when contracting, patient will bulge her abdomen when contracting the pelvic floor.              Patient confirms identification and approves PT to assess internal pelvic floor and treatment No         TODAY'S TREATMENT:   09/01/23 Exercises: Strengthening: all exercises with pelvic floor contraction Nustep for 7 minutes at level 6 while assessing patient  Side step onto the oval disc holding onto the parallel bars Step up on 4 inch step with holding onto the parallel bars Stepping over the cones with holding onto the parallel bars Standing holding on to the parallel bars with hip extension.  Self-care: Educated patient to not go to the bathroom unless she has the urge. She is going to urinate when she does not have the urgency.  To prevent her from going to the bathroom she is to call a friend or do her puzzles to take her mind off urination.  Discussed with patient to not go to the bathroom in the middle of the night and instead go back to sleep to reduce the number of times she is going to the bathroom     08/27/23 Neuro Re-ed:  RUSI in supine with ultrasound head above the pubic bone on the  pelvic floor 2 setting, sidelying, and seated: inhalation and lengthening pelvic floor, exhalation and shortening pelvic floor x5 breaths in each position Supine with diaphragmatic breathing to lengthen and contract the pelvic floor with verbal cues to breath out with pursed lips then performed in sidely, sitting, and going from sit to stand. Patient was able to use her visual cues to see the pelvic floor contract and understand the right mechanics.  Therapeutic activities: Functional strengthening activities: Sit to stand with exhalation and pelvic floor contraction x5 - to help with leakage during toilet transfers  08/21/23 Exercises: Strengthening: Supine marching 10 x 3 each leg with yellow band around knees Supine knee fall out with red band 10 x  2 each leg Standing marching 3 x 10 Standing hip abduction 3 x 10 Standing hip extension with gluteal squeeze 3 x 10 Therapeutic activities: Functional strengthening activities: Discussed with patient on going to the bathroom at night and trying to miss one of the times to urinate  PATIENT EDUCATION:  08/07/23 Education details: Access Code: [email or web address], how to fill out bladder diary, urge to void when have the urge to urinate Person educated: Patient Education method: Explanation, Demonstration, Tactile cues, Verbal cues, and Handouts Education comprehension: verbalized understanding, returned demonstration, verbal cues required, tactile cues required, and needs further education  HOME EXERCISE PROGRAM: 07/31/23 Access Code: [email or web address] URL: [email or web address] Date: 07/31/2023 Prepared by: Eulis Foster  Program Notes use the estrogen cream around the urethra  Exercises - Hooklying Transversus Abdominis Palpation  - 1 x daily - 7 x weekly - 1 sets - 10 reps - Supine Bridge with Mini Swiss Ball Between  Knees  - 1 x daily - 7 x weekly - 1 sets - 10 reps - Hooklying Isometric Hip Flexion  - 1 x daily - 7 x weekly - 2 sets - 10 reps - Seated Hip Adduction Squeeze with Ball  - 1 x daily - 7 x weekly - 1 sets - 10 reps - 3 sec hold - Seated Abdominal Press into [employer]  - 1 x daily - 3 x weekly - 1 sets - 10 reps - 3 sec hold - Seated Scapular Protraction with Resistance  - 1 x daily - 3 x weekly - 2 sets - 10 reps - Seated Elbow Flexion with Resistance Under Foot  - 1 x daily - 3 x weekly - 2 sets - 10 reps  ASSESSMENT:  CLINICAL IMPRESSION: Patient is a 89 y.o. female who was seen today for physical therapy treatment for urge incontinence and nocturia. Patient woke up 2 times and was able to get to the bathroom without leaking. Patient is going to the bathroom even without the urge to urinate.  Patient changes her pads 2 times per day and uses 1 during the night.  Patient was able to be out for 3 hours without the urge to urinate. Patient needs skilled therapy to improve pelvic floor strength and coordination to reduce urinary leakage.   OBJECTIVE IMPAIRMENTS: decreased coordination, decreased strength, and pain.   ACTIVITY LIMITATIONS: continence, toileting, and locomotion level  PARTICIPATION LIMITATIONS:  home life  PERSONAL FACTORS: Age, Fitness, and 3+ comorbidities: Ambulates with a cane with leg braces for foot drop since hospitalization for Wegener granulomatosis diagnosis in 2020 managed by rheumatology ; IBS; Hypothyroid; Abdominal hysterectomy;   are also affecting patient's functional outcome.   REHAB POTENTIAL: Good  CLINICAL DECISION MAKING: Evolving/moderate complexity  EVALUATION COMPLEXITY: Moderate   GOALS: Goals reviewed with patient? Yes  SHORT TERM GOALS: Target date: 08/21/23  Patient is independent with initial HEP for pelvic floor and core.  Baseline: Goal status: Met 08/07/23  2.  Patient is educated on urge to void to deter urination.  Baseline:  Goal  status: Met 08/07/23  3.  Patient is able to wait 30-45 minutes to urinate while at home.  Baseline:  Goal status: Met 08/14/23   LONG TERM GOALS: Target date: 09/18/23  Patient is independent with advanced HEP for pelvic floor and core strength.  Baseline:  Goal status: ongoing 09/01/23  2.  Patient reports she is able to wait >/= 1.5 hours to urinate when at home during the day due to improved coordination of the pelvic floor.  Baseline:  Goal status: Met 08/14/23  3.  Patient is able to urinate during the night </= 2 times per night due to the ability to use the urge to void behavioral technique.  Baseline: last night woke up 2 times Goal status: ongoing 09/01/23  4.  Patient is able to wear 1 pull up per day and 1 per night due to her reduction of urinary leakage.  Baseline: 2 pull ups during the day and 1 at night.  Goal status: ongoing 09/01/23  5.  Burning during urinate decreased >/= 25% due to the use of her estrogen cream and not wearing as many pull ups.  Baseline:  Goal status: INITIAL   PLAN:  PT FREQUENCY: 1x/week  PT DURATION: 8 weeks  PLANNED INTERVENTIONS: 97110-Therapeutic exercises, 97530- Therapeutic activity, [id number]- Neuromuscular re-education, 97140- Manual therapy, Patient/Family education, and Biofeedback  PLAN FOR NEXT SESSION: continue with strengthening in standing.   Eulis Foster, PT 09/01/23 2:43 PM

## 2023-09-01 NOTE — Patient Instructions (Signed)
 Emily Cannon

## 2023-09-03 ENCOUNTER — Other Ambulatory Visit: Payer: Self-pay

## 2023-09-03 ENCOUNTER — Ambulatory Visit: Payer: Medicare Other | Admitting: Internal Medicine

## 2023-09-03 ENCOUNTER — Encounter: Payer: Self-pay | Admitting: Internal Medicine

## 2023-09-03 VITALS — BP 131/75 | HR 85 | Resp 16 | Ht 61.0 in | Wt 138.0 lb

## 2023-09-03 DIAGNOSIS — N390 Urinary tract infection, site not specified: Secondary | ICD-10-CM | POA: Diagnosis present

## 2023-09-03 DIAGNOSIS — R8271 Bacteriuria: Secondary | ICD-10-CM

## 2023-09-03 DIAGNOSIS — R32 Unspecified urinary incontinence: Secondary | ICD-10-CM

## 2023-09-03 DIAGNOSIS — N3281 Overactive bladder: Secondary | ICD-10-CM

## 2023-09-03 NOTE — Progress Notes (Signed)
 RFV: recurrent uti Patient ID: Emily Cannon, female   DOB: [birth year], 89 y.o.   MRN: [id number]  HPI Emily Cannon is an 89 year old female who presents with recurrent urinary tract symptoms.  She experiences recurrent urinary tract symptoms, including burning sensations that fluctuate in intensity, described as 'flip flop', indicating periods of improvement followed by recurrence. She has resumed using a previously recommended cream, which she finds helpful in alleviating the symptoms.  She has issues with urinary incontinence, sometimes unable to hold her urine, which causes problems. She is undergoing therapy, including pelvic exercises and using a machine at her current therapy location, which is closer to her home in [place]. She urinates frequently, approximately every twenty minutes during the day, but notes improvement at night. She attributes this frequent urination partly to anxiety about having an accident. She uses protective garments and liners as a precaution. No flank pain, fevers, or chills are reported.  She and her family have been healthy otherwise, with no recent flu, despite its prevalence in the community.  Outpatient Encounter Medications as of 09/03/2023  Medication Sig   acetaminophen  (TYLENOL ) 500 MG tablet Take 500 mg by mouth every 6 (six) hours as needed.   amLODipine  (NORVASC ) 5 MG tablet Take 5 mg by mouth daily.   atorvastatin  (LIPITOR) 20 MG tablet TAKE 1 TABLET BY MOUTH DAILY   Avacopan (TAVNEOS) 10 MG CAPS Take 2 capsules by mouth twice daily with meals.   Cholecalciferol 25 MCG (1000 UT) tablet Take 1 capsule by mouth daily.   ciprofloxacin  (CIPRO ) 250 MG tablet Take 1 tablet (250 mg total) by mouth 2 (two) times daily.   esomeprazole  (NEXIUM ) 40 MG capsule TAKE 1 CAPSULE BY MOUTH DAILY   estradiol  (ESTRACE ) 0.1 MG/GM vaginal cream Place 0.5 g vaginally 2 (two) times a week. Place 0.5g nightly for two weeks then twice a week after   furosemide   (LASIX ) 40 MG tablet Take 0.5 tablets (20 mg total) by mouth 3 (three) times a week. Monday, Wed, Friday (Patient taking differently: Take 40 mg by mouth every Monday, Wednesday, and Friday.)   gabapentin  (NEURONTIN ) 100 MG capsule Take 2 capsules by mouth at night for neuropathy pain (Patient taking differently: 100 mg 2 (two) times daily. Take 2 capsules by mouth at night for neuropathy pain)   levothyroxine  (SYNTHROID ) 50 MCG tablet TAKE ONE TABLET BY MOUTH EVERY MORNING BEFORE BREAKFAST   methenamine  (HIPREX ) 1 g tablet TAKE 1 TABLET BY MOUTH 2 TIMES A DAY WITH A MEAL   metoprolol  succinate (TOPROL -XL) 50 MG 24 hr tablet TAKE 1 TABLET BY MOUTH 2 TIMES A DAY WITH OR IMMEDIATELY FOLLOWING A MEAL   Multiple Vitamins-Minerals (CENTRUM SILVER 50+WOMEN) TABS Take 1 tablet by mouth daily with breakfast.   silver sulfADIAZINE (SILVADENE) 1 % cream Apply 1 Application topically daily.   traMADol  (ULTRAM ) 50 MG tablet TAKE ONE TABLET BY MOUTH EVERY 6 HOURS AS NEEDED FOR UP TO 5 DAYS   valACYclovir  (VALTREX ) 500 MG tablet Take 500 mg by mouth 2 (two) times daily.   VASCEPA  1 g capsule TAKE 2 CAPSULES BY MOUTH TWICE A DAY   Vibegron  (GEMTESA ) 75 MG TABS Take 1 tablet (75 mg total) by mouth daily.   No facility-administered encounter medications on file as of 09/03/2023.     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) 09/25/2020   Peripheral neuropathy 05/08/2020   Weakness 03/28/2020   Gait abnormality 03/28/2020   Paresthesia 03/28/2020   Left foot drop 03/23/2020   Heme positive stool    Benign neoplasm of colon    Acute blood loss anemia 12/30/2018   Symptomatic anemia 12/15/2018   AKI (acute kidney injury) (HCC) 12/15/2018   Hypomagnesemia  12/15/2018   Steroid-induced hyperglycemia 12/15/2018   Microscopic polyangiitis (HCC) 11/13/2018   Positive P-ANCA titer 10/14/2018   Viral URI 05/12/2018   Syncope 06/05/2016   Venous (peripheral) insufficiency 11/23/2012   Dermatitis 11/10/2012   Hypothyroidism 05/22/2011   Perforation of tympanic membrane 08/08/2007   Osteopenia 07/27/2007   Hyperlipidemia 05/11/2007   Anxiety state 05/11/2007   Essential hypertension 05/11/2007   Allergic rhinitis 05/11/2007   Irritable bowel syndrome 05/11/2007   LOW BACK PAIN 05/11/2007   Acquired absence of genital organ 05/11/2007     Health Maintenance Due  Topic Date Due   COVID-19 Vaccine (6 - 2024-25 season) 07/04/2023     Review of Systems 12 point ros is negative Physical Exam   Resp 16   Ht 5' 1 (1.549 m)   Wt 138 lb (62.6 kg)   BMI 26.07 kg/m    Physical Exam  Constitutional:  oriented to person, place, and time. appears well-developed and well-nourished. No distress.  HENT: Center/AT, PERRLA, no scleral icterus Mouth/Throat: Oropharynx is clear and moist. No oropharyngeal exudate.  Cardiovascular: Normal rate, regular rhythm and normal heart sounds. Exam reveals no gallop and no friction rub.  No murmur heard.  Pulmonary/Chest: Effort normal and breath sounds normal. No respiratory distress.  has no wheezes.  Neck = supple, no nuchal rigidity Abdominal: Soft. Bowel sounds are normal.  exhibits no distension. There is no tenderness.  Lymphadenopathy: no cervical adenopathy. No axillary adenopathy Neurological: alert and oriented to person, place, and time.  Skin: Skin is warm and dry. No rash noted. No erythema.  Psychiatric: a normal mood and affect.  behavior is normal.   Lab Results  Component Value Date   LABRPR Non Reactive 07/18/2020    CBC Lab Results  Component Value Date   WBC 5.6 04/08/2023   RBC 4.53 04/08/2023   HGB 14.3 04/08/2023   HCT 40.8 04/08/2023   PLT 169 04/08/2023   MCV 90.1 04/08/2023    MCH 31.6 04/08/2023   MCHC 35.0 04/08/2023   RDW 13.6 04/08/2023   LYMPHSABS 2,257 04/08/2023   MONOABS 0.8 08/30/2022   EOSABS 190 04/08/2023    BMET Lab Results  Component Value Date   NA 140 05/13/2023   K 4.5 05/13/2023   CL 104 05/13/2023   CO2 26 05/13/2023   GLUCOSE 96 05/13/2023   BUN 16 05/13/2023   CREATININE 1.16 (H) 05/13/2023   CALCIUM  9.4 05/13/2023   GFRNONAA 20 (L) 01/03/2019   GFRAA 23 (L) 01/03/2019      Assessment and Plan   Recurrent Urinary Tract Infections (UTIs) Intermittent burning sensations and recurrence of symptoms despite ongoing therapy. Prescribed cream provides some relief. Pelvic exercises and other therapies with a therapist help intermittently. Emphasized the importance of regular urination to prevent overflow and symptom exacerbation. - Continue prescribed cream as needed. - Continue pelvic exercises and therapies as advised by the therapist. - Encourage regular urination to prevent overflow.  Urinary Incontinence Difficulty holding urine and frequent urination, approximately every 20  minutes during the day. Uses protective liners and pants. Some symptoms may be influenced by psychological factors. Discussed the potential benefit of spacing urination intervals to every hour to reduce anxiety and improve bladder control. - Encourage spacing urination intervals to every hour. - Continue using protective liners and pants as needed.  General Health Maintenance No flank pain, fevers, chills, or recent illnesses. Generally healthy despite the flu being prevalent in the community.

## 2023-09-05 ENCOUNTER — Encounter: Payer: Self-pay | Admitting: Obstetrics

## 2023-09-05 ENCOUNTER — Ambulatory Visit (INDEPENDENT_AMBULATORY_CARE_PROVIDER_SITE_OTHER): Payer: Medicare Other | Admitting: Obstetrics

## 2023-09-05 VITALS — BP 125/77 | HR 98

## 2023-09-05 DIAGNOSIS — Z87440 Personal history of urinary (tract) infections: Secondary | ICD-10-CM | POA: Diagnosis not present

## 2023-09-05 DIAGNOSIS — R351 Nocturia: Secondary | ICD-10-CM

## 2023-09-05 DIAGNOSIS — N3941 Urge incontinence: Secondary | ICD-10-CM

## 2023-09-05 MED ORDER — VITAMIN C 100 MG PO TABS: 100.0000 mg | ORAL_TABLET | Freq: Every day | ORAL | 2 refills | Status: AC

## 2023-09-05 MED ORDER — GEMTESA 75 MG PO TABS: 75.0000 mg | ORAL_TABLET | Freq: Every day | ORAL | Status: DC

## 2023-09-05 MED ORDER — ESTRADIOL 7.5 MCG/24HR VA RING: 1.0000 | VAGINAL_RING | VAGINAL | 3 refills | Status: DC

## 2023-09-05 MED ORDER — GEMTESA 75 MG PO TABS: 75.0000 mg | ORAL_TABLET | Freq: Every day | ORAL | 2 refills | Status: DC

## 2023-09-05 NOTE — Assessment & Plan Note (Signed)
-   limited fluid intake after dinner - continue elevating legs during daytime to increase venous return - switch lasix  to 2pm dosing - denies snoring or history of OSA - trial of Gemtesa  with samples provided

## 2023-09-05 NOTE — Patient Instructions (Signed)
 We discussed the symptoms of overactive bladder (OAB), which include urinary urgency, urinary frequency, night-time urination, with or without urge incontinence.  We discussed management including behavioral therapy (decreasing bladder irritants by following a bladder diet, urge suppression strategies, timed voids, bladder retraining), physical therapy, medication; and for refractory cases posterior tibial nerve stimulation, sacral neuromodulation, and intravesical botulinum toxin injection.   For Beta-3 agonist medication, we discussed the potential side effect of elevated blood pressure which is more likely to occur in individuals with uncontrolled hypertension. You were given samples for Gemtesa  75 mg.  It can take a month to start working so give it time, but if you have bothersome side effects call sooner and we can try a different medication.  Call us  if you have trouble filling the prescription or if it's not covered by your insurance.  Call your pharmacy to assess cost of estrogen ring, if it is affordable you can bring it to your follow-up appointment for placement.

## 2023-09-05 NOTE — Progress Notes (Signed)
 Emily Cannon Return Visit  SUBJECTIVE  History of Present Illness: Emily Cannon is a 89 y.o. female seen in follow-up for urgency urinary incontinence, history of rUTI, nocturia. Plan at last visit was vaginal estrogen, gemtesa , pelvic floor PT.   Reports resolution of burning with urination Initially stopped using it due to burning after vaginal estrogen application.  Continues methenamine  with juice, dislikes due to burning in her mouth and stomach  Urine testing: - 08/20/23 UA + leuk/protein - 08/05/23 UA + leuk/protein, culture 50-100K E. Coli resistant to ampicillin and bactrim. Rx Cipro  250mg  BID x 7d - 04/29/23 UA + leuk, culture >100K E. Coli resistant to ampicillin and bactrim. Not treated due to improvement of symptoms after visit - Cr 1.17 on 07/03/23 PFPT with some relief Did not start Gemtesa  Continues to follow-up with Dr. Luiz (ID)  Past Medical History: Patient  has a past medical history of Allergic rhinitis, cause unspecified, Allergy, Anxiety state, unspecified, Blood transfusion without reported diagnosis, Cataract, Chronic kidney disease, Disorder of bone and cartilage, unspecified, Esophageal candidiasis (HCC), Foot drop, Gastric ulcer, History of CMV, History of Helicobacter pylori infection, Hypothyroid, Irritable bowel syndrome, Lumbago, Microscopic polyangiitis (HCC), Other and unspecified hyperlipidemia, Pancreatic cyst, Perforation of tympanic membrane, unspecified, and Unspecified essential hypertension.   Past Surgical History: She  has a past surgical history that includes Abdominal hysterectomy; cataract surgery (5/12 and 12/24/2012); Tonsillectomy; Colonoscopy with propofol  (N/A, 01/02/2019); Esophagogastroduodenoscopy (egd) with propofol  (N/A, 01/02/2019); biopsy (01/02/2019); polypectomy (01/02/2019); NSVD; Colonoscopy; Polypectomy; and Upper gastrointestinal endoscopy.   Medications: She has a current medication list which includes the following  prescription(s): acetaminophen , amlodipine , vitamin c , atorvastatin , tavneos, cholecalciferol, esomeprazole , estradiol , estradiol , furosemide , gabapentin , levothyroxine , methenamine , metoprolol  succinate, centrum silver 50+women, silver sulfadiazine, tramadol , valacyclovir , vascepa , gemtesa , and gemtesa .   Allergies: Patient is allergic to alendronate sodium, amoxicillin, cephalexin , codeine, and valsartan.   Social History: Patient  reports that she has never smoked. She has never used smokeless tobacco. She reports that she does not drink alcohol and does not use drugs.     OBJECTIVE     Physical Exam: Vitals:   09/05/23 1509  BP: 125/77  Pulse: 98   Gen: No apparent distress, A&O x 3.  Detailed Urogynecologic Evaluation:  Deferred. Prior exam showed:      No data to display             ASSESSMENT AND PLAN    Emily Cannon is a 89 y.o. with:  1. Urge urinary incontinence   2. History of recurrent UTI (urinary tract infection)   3. Nocturia     Urge urinary incontinence Assessment & Plan: - We discussed the symptoms of overactive bladder (OAB), which include urinary urgency, urinary frequency, nocturia, with or without urge incontinence.  While we do not know the exact etiology of OAB, several treatment options exist. We discussed management including behavioral therapy (decreasing bladder irritants, urge suppression strategies, timed voids, bladder retraining), physical therapy, medication; for refractory cases posterior tibial nerve stimulation, sacral neuromodulation, and intravesical botulinum toxin injection.  For anticholinergic medications, we discussed the potential side effects of anticholinergics including dry eyes, dry mouth, constipation, cognitive impairment and urinary retention. For Beta-3 agonist medication, we discussed the potential side effect of elevated blood pressure which is more likely to occur in individuals with uncontrolled hypertension. - samples  of Gemtesa  provided for trial  - reduce caffeine intake - continue pelvic floor PT and bladder training  - consider functional causes of incontinence due to  gait disturbance and cane use  Orders: -     Gemtesa ; Take 1 tablet (75 mg total) by mouth daily.  Dispense: 28 tablet -     Gemtesa ; Take 1 tablet (75 mg total) by mouth daily.  Dispense: 30 tablet; Refill: 2 -     Estradiol ; Place 1 each vaginally every 3 (three) months.  Dispense: 1 each; Refill: 3  History of recurrent UTI (urinary tract infection) Assessment & Plan: - evaluated by Dr. Luiz (ID) and started methenamine , continue with Vit C use and stop orange juice due to upset stomach - Rx Vitamin C  100mg  daily - For treatment of recurrent urinary tract infections, we discussed management of recurrent UTIs including prophylaxis with a daily low dose antibiotic, transvaginal estrogen therapy, D-mannose, and cranberry supplements.  We discussed the role of diagnostic testing such as cystoscopy and upper tract imaging.   - provided cranberry extract samples  - encouraged probiotics - continue vaginal estrogen due to relief, resumed 2 weeks ago, reassess in 2 months - catheterized urine testing if clinical change to minimize contamination  Orders: -     Vitamin C ; Take 1 tablet (100 mg total) by mouth daily.  Dispense: 30 tablet; Refill: 2 -     Estradiol ; Place 1 each vaginally every 3 (three) months.  Dispense: 1 each; Refill: 3  Nocturia Assessment & Plan: - limited fluid intake after dinner - continue elevating legs during daytime to increase venous return - switch lasix  to 2pm dosing - denies snoring or history of OSA - trial of Gemtesa  with samples provided   Time spent: I spent 39 minutes dedicated to the care of this patient on the date of this encounter to include pre-visit review of records, face-to-face time with the patient discussing recurrent UTI, nocturia, urgency urinary incontinence, and post visit  documentation and ordering medication/ testing.    Lianne ONEIDA Gillis, MD

## 2023-09-05 NOTE — Assessment & Plan Note (Signed)
-   We discussed the symptoms of overactive bladder (OAB), which include urinary urgency, urinary frequency, nocturia, with or without urge incontinence.  While we do not know the exact etiology of OAB, several treatment options exist. We discussed management including behavioral therapy (decreasing bladder irritants, urge suppression strategies, timed voids, bladder retraining), physical therapy, medication; for refractory cases posterior tibial nerve stimulation, sacral neuromodulation, and intravesical botulinum toxin injection.  For anticholinergic medications, we discussed the potential side effects of anticholinergics including dry eyes, dry mouth, constipation, cognitive impairment and urinary retention. For Beta-3 agonist medication, we discussed the potential side effect of elevated blood pressure which is more likely to occur in individuals with uncontrolled hypertension. - samples of Gemtesa  provided for trial  - reduce caffeine intake - continue pelvic floor PT and bladder training  - consider functional causes of incontinence due to gait disturbance and cane use

## 2023-09-05 NOTE — Assessment & Plan Note (Addendum)
-   evaluated by Dr. Luiz (ID) and started methenamine , continue with Vit C use and stop orange juice due to upset stomach - Rx Vitamin C  100mg  daily - For treatment of recurrent urinary tract infections, we discussed management of recurrent UTIs including prophylaxis with a daily low dose antibiotic, transvaginal estrogen therapy, D-mannose, and cranberry supplements.  We discussed the role of diagnostic testing such as cystoscopy and upper tract imaging.   - provided cranberry extract samples  - encouraged probiotics - continue vaginal estrogen due to relief, resumed 2 weeks ago, reassess in 2 months - catheterized urine testing if clinical change to minimize contamination

## 2023-09-08 ENCOUNTER — Ambulatory Visit: Payer: Medicare Other | Admitting: Physical Therapy

## 2023-09-08 DIAGNOSIS — M6281 Muscle weakness (generalized): Secondary | ICD-10-CM | POA: Diagnosis not present

## 2023-09-08 DIAGNOSIS — R3915 Urgency of urination: Secondary | ICD-10-CM

## 2023-09-08 NOTE — Therapy (Signed)
 OUTPATIENT PHYSICAL THERAPY FEMALE PELVIC TREATMENT   Patient Name: Emily Cannon MRN: [id number] DOB:[birth year], 89 y.o., female Today's Date: 09/08/2023  END OF SESSION:  PT End of Session - 09/08/23 1237     Visit Number 8    Date for PT Re-Evaluation 12/11/23    Authorization Type Medicare    Authorization - Number of Visits 8    Progress Note Due on Visit 10    PT Start Time 1230    PT Stop Time 1310    PT Time Calculation (min) 40 min    Activity Tolerance Patient tolerated treatment well    Behavior During Therapy WFL for tasks assessed/performed              Past Medical History:  Diagnosis Date   Allergic rhinitis, cause unspecified    Allergy    Anxiety state, unspecified    Blood transfusion without reported diagnosis    2020   Cataract    removed both eyes    Chronic kidney disease    Disorder of bone and cartilage, unspecified    Esophageal candidiasis (HCC)    Foot drop    Gastric ulcer    History of CMV    History of Helicobacter pylori infection    Hypothyroid    Irritable bowel syndrome    Lumbago    Microscopic polyangiitis (HCC)    Other and unspecified hyperlipidemia    Pancreatic cyst    Perforation of tympanic membrane, unspecified    Unspecified essential hypertension    Past Surgical History:  Procedure Laterality Date   ABDOMINAL HYSTERECTOMY     BIOPSY  01/02/2019   Procedure: BIOPSY;  Surgeon: Ace Holder, MD;  Location: [facility] ENDOSCOPY;  Service: Gastroenterology;;   cataract surgery  5/12 and 12/24/2012   both eyes   COLONOSCOPY     COLONOSCOPY WITH PROPOFOL  N/A 01/02/2019   Procedure: COLONOSCOPY WITH PROPOFOL ;  Surgeon: Ace Holder, MD;  Location: [facility] ENDOSCOPY;  Service: Gastroenterology;  Laterality: N/A;   ESOPHAGOGASTRODUODENOSCOPY (EGD) WITH PROPOFOL  N/A 01/02/2019   Procedure: ESOPHAGOGASTRODUODENOSCOPY (EGD) WITH PROPOFOL ;  Surgeon: Ace Holder, MD;  Location: [facility] ENDOSCOPY;  Service:  Gastroenterology;  Laterality: N/A;   NSVD     x1   POLYPECTOMY  01/02/2019   Procedure: POLYPECTOMY;  Surgeon: Ace Holder, MD;  Location: [facility] ENDOSCOPY;  Service: Gastroenterology;;   POLYPECTOMY     TONSILLECTOMY     UPPER GASTROINTESTINAL ENDOSCOPY     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) 09/25/2020   Peripheral neuropathy 05/08/2020   Weakness 03/28/2020   Gait abnormality 03/28/2020   Paresthesia 03/28/2020   Left foot drop 03/23/2020   Heme positive stool    Benign neoplasm of colon    Acute blood loss anemia 12/30/2018   Symptomatic anemia 12/15/2018   AKI (acute kidney injury) (HCC) 12/15/2018   Hypomagnesemia 12/15/2018   Steroid-induced hyperglycemia 12/15/2018   Microscopic polyangiitis (HCC) 11/13/2018   Positive P-ANCA titer 10/14/2018   Viral URI 05/12/2018   Syncope 06/05/2016   Venous (peripheral) insufficiency 11/23/2012   Dermatitis 11/10/2012   Hypothyroidism 05/22/2011   Perforation of tympanic membrane 08/08/2007   Osteopenia 07/27/2007   Hyperlipidemia 05/11/2007   Anxiety state 05/11/2007  Essential hypertension 05/11/2007   Allergic rhinitis 05/11/2007   Irritable bowel syndrome 05/11/2007   LOW BACK PAIN 05/11/2007   Acquired absence of genital organ 05/11/2007    PCP: Marquetta Sit, MD  REFERRING PROVIDER: Darlene Ehlers, MD   REFERRING DIAG:  N39.41 (ICD-10-CM) - Urge urinary incontinence  R35.1 (ICD-10-CM) - Nocturia    THERAPY DIAG:  Urinary urgency - Plan: PT plan of care cert/re-cert  Muscle weakness (generalized) - Plan: PT plan of care cert/re-cert  Rationale for Evaluation and Treatment: Rehabilitation  ONSET DATE: 2022  SUBJECTIVE:                                                                                                                                                                                            SUBJECTIVE STATEMENT: Patient is still voiding 3x/night. She is still voiding every 2 hrs during the day, and she is able to control urine until she gets her pants down to her knees. At this point in time, she is losing control of urine when sitting down on the toilet.   Fluid intake:  36oz water per day, 4 cups of coffee intake    PAIN:  Are you having pain? Yes NPRS scale: 5/10 Pain location: Vaginal  Pain type: burning Pain description: intermittent   Aggravating factors: when urinating Relieving factors: not urinating  PRECAUTIONS: None  RED FLAGS: None   WEIGHT BEARING RESTRICTIONS: No  FALLS:  Has patient fallen in last 6 months? No  LIVING ENVIRONMENT: Lives with: lives with their spouse   OCCUPATION: retired  PLOF: Independent  PATIENT GOALS: reduce the urinary leakage and reduce the number pads.   PERTINENT HISTORY:  Ambulates with a cane with leg braces for foot drop since hospitalization for Wegener granulomatosis diagnosis in 2020 managed by rheumatology ; IBS; Hypothyroid; Abdominal hysterectomy;    URINATION: Pain with urination: Yes Fully empty bladder: sit to lean forward to fully empty her bladder;  dribbling after finishing, and the need to urinate multiple times in a row  Stream: Weak Urgency: Yes: while walking to the bathroom and while she is getting her underwear off Frequency: Day time voids >15.  Nocturia: 2-5 times per night to void.  Leakage: Urge to void, Walking to the bathroom, Coughing, Sneezing, Laughing, Exercise, and going from sitting to  standing Pads: 3-4 adult diapers per day    PREGNANCY: Vaginal deliveries 1 Tearing No  PROLAPSE: None   OBJECTIVE:  Note: Objective measures were completed at Evaluation unless otherwise noted.  DIAGNOSTIC FINDINGS:  Pelvic  floor strength V/V, puborectalis V/V  Pelvic floor musculature: Right levator tender,  Right obturator non-tender, Left levator non-tender, Left obturator non-tender  Post Void Residual 47 mL     COGNITION: Overall cognitive status: Within functional limits for tasks assessed      GAIT:  Assistive device utilized: Walker - 4 wheeled Level of assistance: Complete Independence Comments: She wears braces on her lower legs  POSTURE: No Significant postural limitations  PELVIC ALIGNMENT:   LOWER EXTREMITY ROM:  Passive ROM Right eval Left eval  Hip internal rotation 10 20  Hip external rotation 35 30   (Blank rows = not tested)  LOWER EXTREMITY MMT:  MMT Right eval Left eval Right/left 08/21/23  Hip extension 4/5 4/5 4/5  Hip abduction 3+/5 3+/5 3+/5   PALPATION:   General  right lower quadrant tenderness; Patient will bulge the lower abdomen when contracting, patient will bulge her abdomen when contracting the pelvic floor.              Patient confirms identification and approves PT to assess internal pelvic floor and treatment No         TODAY'S TREATMENT:   09/08/23 Exercises: Strengthening: Nustep for 7 minutes at level 6 while assessing patient  Step up on foam and down working with hip strength Step over the agility poles forward 10 x then sideways Step up on 6 inch step 10 x each leg Sitting moving the 3# wt diagonally to strengthen the abdominals.  Sit to stand with using the hands the crease 10 x 2  09/01/23 Exercises: Strengthening: all exercises with pelvic floor contraction Nustep for 7 minutes at level 6 while assessing patient  Side step onto the oval disc holding onto the parallel bars Step up on 4 inch step with holding onto the parallel bars Stepping over the cones with holding onto the parallel bars Standing holding on to the parallel bars with hip extension.  Self-care: Educated patient to not go to the bathroom unless she has the urge. She  is going to urinate when she does not have the urgency.  To prevent her from going to the bathroom she is to call a friend or do her puzzles to take her mind off urination.  Discussed with patient to not go to the bathroom in the middle of the night and instead go back to sleep to reduce the number of times she is going to the bathroom     08/27/23 Neuro Re-ed:  RUSI in supine with ultrasound head above the pubic bone on the pelvic floor 2 setting, sidelying, and seated: inhalation and lengthening pelvic floor, exhalation and shortening pelvic floor x5 breaths in each position Supine with diaphragmatic breathing to lengthen and contract the pelvic floor with verbal cues to breath out with pursed lips then performed in sidely, sitting, and going from sit to stand. Patient was able to use her visual cues to see the pelvic floor contract and understand the right mechanics.  Therapeutic activities: Functional strengthening activities: Sit to stand with exhalation and pelvic floor contraction x5 - to help with leakage during toilet transfers  PATIENT EDUCATION:  08/07/23 Education details: Access Code: [email or web address], how to fill out bladder diary, urge to void when have the urge to urinate Person educated: Patient Education method: Explanation, Demonstration, Tactile cues, Verbal cues, and Handouts Education comprehension: verbalized understanding, returned demonstration, verbal cues required, tactile cues required, and needs further education  HOME EXERCISE PROGRAM: 07/31/23 Access Code: [email or web address] URL: [email or web address] Date: 07/31/2023 Prepared by: Marsha Skeen  Program Notes use the estrogen cream around the urethra  Exercises - Hooklying Transversus Abdominis Palpation  - 1 x daily - 7 x weekly - 1 sets - 10 reps - Supine Bridge with Mini Swiss Ball Between  Knees  - 1 x daily - 7 x weekly - 1 sets - 10 reps - Hooklying Isometric Hip Flexion  - 1 x daily - 7 x weekly - 2 sets - 10 reps - Seated Hip Adduction Squeeze with Ball  - 1 x daily - 7 x weekly - 1 sets - 10 reps - 3 sec hold - Seated Abdominal Press into [employer]  - 1 x daily - 3 x weekly - 1 sets - 10 reps - 3 sec hold - Seated Scapular Protraction with Resistance  - 1 x daily - 3 x weekly - 2 sets - 10 reps - Seated Elbow Flexion with Resistance Under Foot  - 1 x daily - 3 x weekly - 2 sets - 10 reps  ASSESSMENT:  CLINICAL IMPRESSION: Patient is a 89 y.o. female who was seen today for physical therapy treatment for urge incontinence and nocturia. Patient woke up 2 times and was able to get to the bathroom without leaking. Patient is going to the bathroom even without the urge to urinate.  Patient changes her pads 2 times per day and uses 1 during the night.  Patient was able to be out for 3 hours without the urge to urinate. Patient has increased strength. Her pads are not as wet. Patient is having less burning since she is using the vaginal moisturizer. Patient needs skilled therapy to improve pelvic floor strength and coordination to reduce urinary leakage.   OBJECTIVE IMPAIRMENTS: decreased coordination, decreased strength, and pain.   ACTIVITY LIMITATIONS: continence, toileting, and locomotion level  PARTICIPATION LIMITATIONS:  home life  PERSONAL FACTORS: Age, Fitness, and 3+ comorbidities: Ambulates with a cane with leg braces for foot drop since hospitalization for Wegener granulomatosis diagnosis in 2020 managed by rheumatology ; IBS; Hypothyroid; Abdominal hysterectomy;   are also affecting patient's functional outcome.   REHAB POTENTIAL: Good  CLINICAL DECISION MAKING: Evolving/moderate complexity  EVALUATION COMPLEXITY: Moderate   GOALS: Goals reviewed with patient? Yes  SHORT TERM GOALS: Target date: 08/21/23  Patient is independent with initial HEP for pelvic floor  and core.  Baseline: Goal status: Met 08/07/23  2.  Patient is educated on urge to void to deter urination.  Baseline:  Goal status: Met 08/07/23  3.  Patient is able to wait 30-45 minutes to urinate while at home.  Baseline:  Goal status: Met 08/14/23   LONG TERM GOALS: Target date: 12/11/23  Patient is independent with advanced HEP for pelvic floor and core strength.  Baseline:  Goal status: ongoing 09/01/23  2.  Patient reports she is able to wait >/= 1.5 hours to urinate when at home during the day due to improved coordination of the pelvic floor.  Baseline:  Goal status: Met 08/14/23  3.  Patient is able to urinate during the night </= 2 times per night due to  the ability to use the urge to void behavioral technique.  Baseline: last night woke up 2 times Goal status: ongoing 09/01/23  4.  Patient is able to wear 1 pull up per day and 1 per night due to her reduction of urinary leakage.  Baseline: 2 pull ups during the day and 1 at night.  Goal status: ongoing 12/11/23  5.  Burning during urinate decreased >/= 25% due to the use of her estrogen cream and not wearing as many pull ups.  Baseline:  Goal status: Met 09/08/23   PLAN:  PT FREQUENCY: 1x/week  PT DURATION: 8 weeks  PLANNED INTERVENTIONS: 97110-Therapeutic exercises, 97530- Therapeutic activity, 97112- Neuromuscular re-education, 97140- Manual therapy, Patient/Family education, and Biofeedback  PLAN FOR NEXT SESSION: continue with strengthening in standing.   Marsha Skeen, PT 09/08/23 3:58 PM

## 2023-09-17 ENCOUNTER — Other Ambulatory Visit: Payer: Self-pay | Admitting: Internal Medicine

## 2023-09-17 DIAGNOSIS — R300 Dysuria: Secondary | ICD-10-CM

## 2023-09-18 ENCOUNTER — Telehealth: Payer: Self-pay

## 2023-09-18 NOTE — Telephone Encounter (Signed)
Per Dr. Drue Second:  it must be for prophylaxis ag. nocardia or pjp if she is getting any of those biologic infusions. have her stop the methenamie and see how she does off of it . continue on bactrim

## 2023-09-18 NOTE — Telephone Encounter (Signed)
Received call from [facility] stating patient is receiving both methenamine and Bactrim which is contraindicated.   Bactrim is being prescribed by Dr. Marlyn Corporal with [facility]. Called his office and spoke with Aleah, Bactrim was originally ordered 09/2022 with 5 refills. According to pharmacy, patient is alternating pickup of Bactrim and methenamine.   Rheumatology office and [facility] are unable to provide clarification on indication for Bactrim.   Sandie Ano, RN

## 2023-09-18 NOTE — Telephone Encounter (Signed)
Called patient to notify her of provider's recommendation, no answer on either number and unable to leave message.  Called [facility] back and spoke with Marchelle Folks. Advised her to cancel refill of methenamine per Dr. Drue Second.   Sandie Ano, RN

## 2023-09-19 NOTE — Telephone Encounter (Signed)
Called Emily Cannon, her husband Rosanne Ashing [facility] answered. Relayed Dr. Feliz Beam recommendation that patient stop methenamine and see how she does without it.   Discussed contraindication with Bactrim. Rosanne Ashing is not sure if she's taking Bactrim. Encouraged him to call rheumatologists's office to see if Laylah is still supposed to be taking it.   Sandie Ano, RN

## 2023-09-22 ENCOUNTER — Ambulatory Visit: Payer: Medicare Other | Admitting: Physical Therapy

## 2023-09-22 ENCOUNTER — Encounter: Payer: Self-pay | Admitting: Physical Therapy

## 2023-09-22 DIAGNOSIS — R3915 Urgency of urination: Secondary | ICD-10-CM

## 2023-09-22 DIAGNOSIS — M6281 Muscle weakness (generalized): Secondary | ICD-10-CM | POA: Diagnosis not present

## 2023-09-22 NOTE — Therapy (Signed)
 OUTPATIENT PHYSICAL THERAPY FEMALE PELVIC TREATMENT   Patient Name: Emily Cannon MRN: [id number] DOB:1932-09-23, 89 y.o., female Today's Date: 09/22/2023  END OF SESSION:  PT End of Session - 09/22/23 1101     Visit Number 9    Date for PT Re-Evaluation 12/11/23    Authorization Type Medicare    Authorization - Number of Visits 9    Progress Note Due on Visit 10    PT Start Time 1100    PT Stop Time 1140    PT Time Calculation (min) 40 min    Activity Tolerance Patient tolerated treatment well    Behavior During Therapy WFL for tasks assessed/performed              Past Medical History:  Diagnosis Date   Allergic rhinitis, cause unspecified    Allergy    Anxiety state, unspecified    Blood transfusion without reported diagnosis    2020   Cataract    removed both eyes    Chronic kidney disease    Disorder of bone and cartilage, unspecified    Esophageal candidiasis (HCC)    Foot drop    Gastric ulcer    History of CMV    History of Helicobacter pylori infection    Hypothyroid    Irritable bowel syndrome    Lumbago    Microscopic polyangiitis (HCC)    Other and unspecified hyperlipidemia    Pancreatic cyst    Perforation of tympanic membrane, unspecified    Unspecified essential hypertension    Past Surgical History:  Procedure Laterality Date   ABDOMINAL HYSTERECTOMY     BIOPSY  01/02/2019   Procedure: BIOPSY;  Surgeon: Benancio Deeds, MD;  Location: [facility] ENDOSCOPY;  Service: Gastroenterology;;   cataract surgery  5/12 and 12/24/2012   both eyes   COLONOSCOPY     COLONOSCOPY WITH PROPOFOL N/A 01/02/2019   Procedure: COLONOSCOPY WITH PROPOFOL;  Surgeon: Benancio Deeds, MD;  Location: [facility] ENDOSCOPY;  Service: Gastroenterology;  Laterality: N/A;   ESOPHAGOGASTRODUODENOSCOPY (EGD) WITH PROPOFOL N/A 01/02/2019   Procedure: ESOPHAGOGASTRODUODENOSCOPY (EGD) WITH PROPOFOL;  Surgeon: Benancio Deeds, MD;  Location: [facility] ENDOSCOPY;  Service:  Gastroenterology;  Laterality: N/A;   NSVD     x1   POLYPECTOMY  01/02/2019   Procedure: POLYPECTOMY;  Surgeon: Benancio Deeds, MD;  Location: [facility] ENDOSCOPY;  Service: Gastroenterology;;   POLYPECTOMY     TONSILLECTOMY     UPPER GASTROINTESTINAL ENDOSCOPY     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) 09/25/2020   Peripheral neuropathy 05/08/2020   Weakness 03/28/2020   Gait abnormality 03/28/2020   Paresthesia 03/28/2020   Left foot drop 03/23/2020   Heme positive stool    Benign neoplasm of colon    Acute blood loss anemia 12/30/2018   Symptomatic anemia 12/15/2018   AKI (acute kidney injury) (HCC) 12/15/2018   Hypomagnesemia 12/15/2018   Steroid-induced hyperglycemia 12/15/2018   Microscopic polyangiitis (HCC) 11/13/2018   Positive P-ANCA titer 10/14/2018   Viral URI 05/12/2018   Syncope 06/05/2016   Venous (peripheral) insufficiency 11/23/2012   Dermatitis 11/10/2012   Hypothyroidism 05/22/2011   Perforation of tympanic membrane 08/08/2007   Osteopenia 07/27/2007   Hyperlipidemia 05/11/2007   Anxiety state 05/11/2007  Essential hypertension 05/11/2007   Allergic rhinitis 05/11/2007   Irritable bowel syndrome 05/11/2007   LOW BACK PAIN 05/11/2007   Acquired absence of genital organ 05/11/2007    PCP: Kristian Covey, MD  REFERRING PROVIDER: Loleta Chance, MD   REFERRING DIAG:  N39.41 (ICD-10-CM) - Urge urinary incontinence  R35.1 (ICD-10-CM) - Nocturia    THERAPY DIAG:  Urinary urgency  Muscle weakness (generalized)  Rationale for Evaluation and Treatment: Rehabilitation  ONSET DATE: 2022  SUBJECTIVE:                                                                                                                                                                                            SUBJECTIVE STATEMENT: The Gemtesa and physical therapy is helping me and I am not wearing a pad due to reduction of leakage. I wear the depends and they are minimally wet. I wake up 2 times per night.  Fluid intake:  36oz water per day, 4 cups of coffee intake    PAIN:  Are you having pain? Yes NPRS scale: 5/10 Pain location: Vaginal  Pain type: burning Pain description: intermittent   Aggravating factors: when urinating Relieving factors: not urinating  PRECAUTIONS: None  RED FLAGS: None   WEIGHT BEARING RESTRICTIONS: No  FALLS:  Has patient fallen in last 6 months? No  LIVING ENVIRONMENT: Lives with: lives with their spouse   OCCUPATION: retired  PLOF: Independent  PATIENT GOALS: reduce the urinary leakage and reduce the number pads.   PERTINENT HISTORY:  Ambulates with a cane with leg braces for foot drop since hospitalization for Wegener granulomatosis diagnosis in 2020 managed by rheumatology ; IBS; Hypothyroid; Abdominal hysterectomy;    URINATION: Pain with urination: Yes Fully empty bladder: sit to lean forward to fully empty her bladder;  dribbling after finishing, and the need to urinate multiple times in a row  Stream: Weak Urgency: Yes: while walking to the bathroom and while she is getting her underwear off Frequency: Day time voids >15.  Nocturia: 2-5 times per night to void.  Leakage: Urge to void, Walking to the bathroom, Coughing, Sneezing, Laughing, Exercise, and going from sitting to  standing Pads: 1 adult diapers per day    PREGNANCY: Vaginal deliveries 1 Tearing No  PROLAPSE: None   OBJECTIVE:  Note: Objective measures were completed at Evaluation unless otherwise noted.  DIAGNOSTIC FINDINGS:  Pelvic floor strength V/V, puborectalis V/V  Pelvic floor musculature: Right levator tender, Right obturator non-tender, Left levator non-tender, Left  obturator non-tender  Post Void Residual 47 mL     COGNITION: Overall cognitive status: Within functional limits for  tasks assessed      GAIT:  Assistive device utilized: Environmental consultant - 4 wheeled Level of assistance: Complete Independence Comments: She wears braces on her lower legs  POSTURE: No Significant postural limitations  PELVIC ALIGNMENT:   LOWER EXTREMITY ROM:  Passive ROM Right eval Left eval  Hip internal rotation 10 20  Hip external rotation 35 30   (Blank rows = not tested)  LOWER EXTREMITY MMT:  MMT Right eval Left eval Right/left 08/21/23 Right/left 09/22/23  Hip extension 4/5 4/5 4/5 4/5  Hip abduction 3+/5 3+/5 3+/5 4/5   PALPATION:   General  right lower quadrant tenderness; Patient will bulge the lower abdomen when contracting, patient will bulge her abdomen when contracting the pelvic floor.              Patient confirms identification and approves PT to assess internal pelvic floor and treatment No         TODAY'S TREATMENT:   09/22/23 Exercises: Strengthening: Walking for 6 minutes to increase my endurance with rolling walker Bridging 20 x with cues to push up further Supine hip abduction with yellow tube band 20 x  Supine marching with yellow tube band 20 x Sitting moving the 3# wt diagonally to strengthen the abdominals.2 x 10 each Sit to stand with using hand to press into groin to assist with standing up    09/08/23 Exercises: Strengthening: Nustep for 7 minutes at level 6 while assessing patient  Step up on foam and down working with hip strength Step over the agility poles forward 10 x then sideways Step up on 6 inch step 10 x each leg Sitting moving the 3# wt diagonally to strengthen the abdominals.  Sit to stand with using the hands the crease 10 x 2  09/01/23 Exercises: Strengthening: all exercises with pelvic floor contraction Nustep for 7 minutes at level 6 while assessing patient  Side step onto the oval disc holding onto  the parallel bars Step up on 4 inch step with holding onto the parallel bars Stepping over the cones with holding onto the parallel bars Standing holding on to the parallel bars with hip extension.  Self-care: Educated patient to not go to the bathroom unless she has the urge. She is going to urinate when she does not have the urgency.  To prevent her from going to the bathroom she is to call a friend or do her puzzles to take her mind off urination.  Discussed with patient to not go to the bathroom in the middle of the night and instead go back to sleep to reduce the number of times she is going to the bathroom                                                                                                                              PATIENT EDUCATION:  08/07/23 Education details: Access Code: [email or web address], how to fill out bladder diary, urge to void when have  the urge to urinate Person educated: Patient Education method: Explanation, Demonstration, Tactile cues, Verbal cues, and Handouts Education comprehension: verbalized understanding, returned demonstration, verbal cues required, tactile cues required, and needs further education  HOME EXERCISE PROGRAM: 07/31/23 Access Code: [email or web address] URL: [email or web address] Date: 07/31/2023 Prepared by: Eulis Foster  Program Notes use the estrogen cream around the urethra  Exercises - Hooklying Transversus Abdominis Palpation  - 1 x daily - 7 x weekly - 1 sets - 10 reps - Supine Bridge with Mini Swiss Ball Between Knees  - 1 x daily - 7 x weekly - 1 sets - 10 reps - Hooklying Isometric Hip Flexion  - 1 x daily - 7 x weekly - 2 sets - 10 reps - Seated Hip Adduction Squeeze with Ball  - 1 x daily - 7 x weekly - 1 sets - 10 reps - 3 sec hold - Seated Abdominal Press into [employer]  - 1 x daily - 3 x weekly - 1 sets - 10 reps - 3 sec hold - Seated Scapular Protraction with Resistance  - 1 x daily - 3 x weekly - 2 sets - 10 reps - Seated  Elbow Flexion with Resistance Under Foot  - 1 x daily - 3 x weekly - 2 sets - 10 reps  ASSESSMENT:  CLINICAL IMPRESSION: Patient is a 89 y.o. female who was seen today for physical therapy treatment for urge incontinence and nocturia. Patient woke up 2 times and was able to get to the bathroom without leaking. Patient is going to the bathroom even without the urge to urinate.  Patient changes her pads 1 times per day and uses 1 during the night.  Increased in bilateral hip abduction strength. Patient is taking Gemtesa and with physical therapy has been helping her with her leakage and the urinary urgency. Patient needs skilled therapy to improve pelvic floor strength and coordination to reduce urinary leakage.   OBJECTIVE IMPAIRMENTS: decreased coordination, decreased strength, and pain.   ACTIVITY LIMITATIONS: continence, toileting, and locomotion level  PARTICIPATION LIMITATIONS:  home life  PERSONAL FACTORS: Age, Fitness, and 3+ comorbidities: Ambulates with a cane with leg braces for foot drop since hospitalization for Wegener granulomatosis diagnosis in 2020 managed by rheumatology ; IBS; Hypothyroid; Abdominal hysterectomy;   are also affecting patient's functional outcome.   REHAB POTENTIAL: Good  CLINICAL DECISION MAKING: Evolving/moderate complexity  EVALUATION COMPLEXITY: Moderate   GOALS: Goals reviewed with patient? Yes  SHORT TERM GOALS: Target date: 08/21/23  Patient is independent with initial HEP for pelvic floor and core.  Baseline: Goal status: Met 08/07/23  2.  Patient is educated on urge to void to deter urination.  Baseline:  Goal status: Met 08/07/23  3.  Patient is able to wait 30-45 minutes to urinate while at home.  Baseline:  Goal status: Met 08/14/23   LONG TERM GOALS: Target date: 12/11/23  Patient is independent with advanced HEP for pelvic floor and core strength.  Baseline:  Goal status: ongoing 09/01/23  2.  Patient reports she is able to wait >/=  1.5 hours to urinate when at home during the day due to improved coordination of the pelvic floor.  Baseline:  Goal status: Met 08/14/23  3.  Patient is able to urinate during the night </= 2 times per night due to the ability to use the urge to void behavioral technique.  Baseline: last night woke up 2 times Goal status: met 09/22/23  4.  Patient is able to wear 1  pull up per day and 1 per night due to her reduction of urinary leakage.  Baseline: 2 pull ups during the day and 1 at night.  Goal status: ongoing 12/11/23  5.  Burning during urinate decreased >/= 25% due to the use of her estrogen cream and not wearing as many pull ups.  Baseline:  Goal status: Met 09/08/23   PLAN:  PT FREQUENCY: 1x/week  PT DURATION: 8 weeks  PLANNED INTERVENTIONS: 97110-Therapeutic exercises, 97530- Therapeutic activity, 97112- Neuromuscular re-education, [id number]- Manual therapy, Patient/Family education, and Biofeedback  PLAN FOR NEXT SESSION: continue with strengthening in standing. Write 10 th visit note (12/26)  Eulis Foster, PT 09/22/23 11:44 AM

## 2023-10-06 ENCOUNTER — Encounter: Payer: Self-pay | Admitting: Physical Therapy

## 2023-10-06 ENCOUNTER — Ambulatory Visit: Payer: Self-pay | Attending: Obstetrics | Admitting: Physical Therapy

## 2023-10-06 DIAGNOSIS — M6281 Muscle weakness (generalized): Secondary | ICD-10-CM | POA: Insufficient documentation

## 2023-10-06 DIAGNOSIS — R3915 Urgency of urination: Secondary | ICD-10-CM | POA: Diagnosis present

## 2023-10-06 NOTE — Therapy (Signed)
 OUTPATIENT PHYSICAL THERAPY FEMALE PELVIC TREATMENT   Patient Name: Emily Cannon MRN: [id number] DOB:1933-01-14, 89 y.o., female Today's Date: 10/06/2023 Progress Note Reporting Period 07/24/23 to 10/06/23  See note below for Objective Data and Assessment of Progress/Goals.     END OF SESSION:  PT End of Session - 10/06/23 1104     Visit Number 10    Date for PT Re-Evaluation 12/11/23    Authorization Type Medicare    Authorization - Number of Visits 10    Progress Note Due on Visit 10    PT Start Time 1100    PT Stop Time 1140    PT Time Calculation (min) 40 min    Activity Tolerance Patient tolerated treatment well    Behavior During Therapy WFL for tasks assessed/performed              Past Medical History:  Diagnosis Date   Allergic rhinitis, cause unspecified    Allergy    Anxiety state, unspecified    Blood transfusion without reported diagnosis    2020   Cataract    removed both eyes    Chronic kidney disease    Disorder of bone and cartilage, unspecified    Esophageal candidiasis (HCC)    Foot drop    Gastric ulcer    History of CMV    History of Helicobacter pylori infection    Hypothyroid    Irritable bowel syndrome    Lumbago    Microscopic polyangiitis (HCC)    Other and unspecified hyperlipidemia    Pancreatic cyst    Perforation of tympanic membrane, unspecified    Unspecified essential hypertension    Past Surgical History:  Procedure Laterality Date   ABDOMINAL HYSTERECTOMY     BIOPSY  01/02/2019   Procedure: BIOPSY;  Surgeon: Benancio Deeds, MD;  Location: [facility] ENDOSCOPY;  Service: Gastroenterology;;   cataract surgery  5/12 and 12/24/2012   both eyes   COLONOSCOPY     COLONOSCOPY WITH PROPOFOL N/A 01/02/2019   Procedure: COLONOSCOPY WITH PROPOFOL;  Surgeon: Benancio Deeds, MD;  Location: [facility] ENDOSCOPY;  Service: Gastroenterology;  Laterality: N/A;   ESOPHAGOGASTRODUODENOSCOPY (EGD) WITH PROPOFOL N/A 01/02/2019   Procedure:  ESOPHAGOGASTRODUODENOSCOPY (EGD) WITH PROPOFOL;  Surgeon: Benancio Deeds, MD;  Location: [facility] ENDOSCOPY;  Service: Gastroenterology;  Laterality: N/A;   NSVD     x1   POLYPECTOMY  01/02/2019   Procedure: POLYPECTOMY;  Surgeon: Benancio Deeds, MD;  Location: [facility] ENDOSCOPY;  Service: Gastroenterology;;   POLYPECTOMY     TONSILLECTOMY     UPPER GASTROINTESTINAL ENDOSCOPY     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) 09/25/2020   Peripheral neuropathy 05/08/2020   Weakness 03/28/2020   Gait abnormality 03/28/2020   Paresthesia 03/28/2020   Left foot drop 03/23/2020   Heme positive stool    Benign neoplasm of colon    Acute blood loss anemia 12/30/2018   Symptomatic anemia 12/15/2018   AKI (acute kidney injury) (HCC) 12/15/2018   Hypomagnesemia 12/15/2018   Steroid-induced hyperglycemia 12/15/2018   Microscopic polyangiitis (HCC) 11/13/2018   Positive P-ANCA titer 10/14/2018   Viral URI 05/12/2018   Syncope 06/05/2016   Venous (peripheral) insufficiency 11/23/2012   Dermatitis 11/10/2012   Hypothyroidism 05/22/2011  Perforation of tympanic membrane 08/08/2007   Osteopenia 07/27/2007   Hyperlipidemia 05/11/2007   Anxiety state 05/11/2007   Essential hypertension 05/11/2007   Allergic rhinitis 05/11/2007   Irritable bowel syndrome 05/11/2007   LOW BACK PAIN 05/11/2007   Acquired absence of genital organ 05/11/2007    PCP: Kristian Covey, MD  REFERRING PROVIDER: Loleta Chance, MD   REFERRING DIAG:  N39.41 (ICD-10-CM) - Urge urinary incontinence  R35.1 (ICD-10-CM) - Nocturia    THERAPY DIAG:  Urinary urgency  Muscle weakness (generalized)  Rationale for Evaluation and Treatment: Rehabilitation  ONSET  DATE: 2022  SUBJECTIVE:                                                                                                                                                                                           SUBJECTIVE STATEMENT: I am not wetting my underwear.   Fluid intake:  36oz water per day, 4 cups of coffee intake    PAIN:  Are you having pain? Yes NPRS scale: 5/10 Pain location: Vaginal  Pain type: burning Pain description: intermittent   Aggravating factors: when urinating Relieving factors: not urinating  PRECAUTIONS: None  RED FLAGS: None   WEIGHT BEARING RESTRICTIONS: No  FALLS:  Has patient fallen in last 6 months? No  LIVING ENVIRONMENT: Lives with: lives with their spouse   OCCUPATION: retired  PLOF: Independent  PATIENT GOALS: reduce the urinary leakage and reduce the number pads.   PERTINENT HISTORY:  Ambulates with a cane with leg braces for foot drop since hospitalization for Wegener granulomatosis diagnosis in 2020 managed by rheumatology ; IBS; Hypothyroid; Abdominal hysterectomy;    URINATION: Pain with urination: Yes Fully empty bladder: sit to lean forward to fully empty her bladder;  dribbling after finishing, and the need to urinate multiple times in a row  Stream: Weak Urgency: Yes: while walking to the bathroom and while she is getting her underwear off Frequency: Day time voids >15.  Nocturia: 2-5 times per night to void.  Leakage: Urge to void, Walking to the bathroom, Coughing, Sneezing, Laughing, Exercise, and going from sitting to  standing Pads: 1 adult diapers per day    PREGNANCY: Vaginal deliveries 1 Tearing No  PROLAPSE: None   OBJECTIVE:  Note: Objective measures were completed at Evaluation unless otherwise noted.  DIAGNOSTIC FINDINGS:  Pelvic floor strength V/V, puborectalis V/V  Pelvic floor musculature: Right levator tender, Right obturator non-tender, Left levator non-tender, Left obturator non-tender   Post Void Residual 47 mL     COGNITION: Overall cognitive status: Within functional limits for tasks assessed      GAIT:  Assistive device utilized: Environmental consultant - 4 wheeled Level of assistance: Complete Independence Comments: She wears braces on her lower legs  POSTURE: No Significant postural limitations  PELVIC ALIGNMENT:   LOWER EXTREMITY ROM:  Passive ROM Right eval Left eval Right 10/06/23 Left  10/06/23  Hip internal rotation 10 20 30 25   Hip external rotation 35 30 30 30    (Blank rows = not tested)  LOWER EXTREMITY MMT:  MMT Right eval Left eval Right/left 08/21/23 Right/left 09/22/23 Right/Left 10/06/23  Hip extension 4/5 4/5 4/5 4/5 4/5  Hip abduction 3+/5 3+/5 3+/5 4/5 4/5   PALPATION:   General  right lower quadrant tenderness; Patient will bulge the lower abdomen when contracting, patient will bulge her abdomen when contracting the pelvic floor.              Patient confirms identification and approves PT to assess internal pelvic floor and treatment No         TODAY'S TREATMENT:   10/06/23 Exercises: Strengthening: Nustep for 7 minutes at level 6 while assessing patient Supine alternate hip flexion with blue round band and shoulder flexion with 1# in each hand Bridge with hip abduction and blue band around knees Sitting moving the 3# wt diagonally to strengthen the abdominals.2 x 10 each with ball squeeze Step over the agility poles forward 10 x then sideways holding bar and VC to step high  Step up on 6 inch step 10 x each leg    09/22/23 Exercises: Strengthening: Walking for 6 minutes to increase my endurance with rolling walker Bridging 20 x with cues to push up further Supine hip abduction with yellow tube band 20 x  Supine marching with yellow tube band 20 x Sitting moving the 3# wt diagonally to strengthen the abdominals.2 x 10 each Sit to stand with using hand to press into groin to assist with standing up     09/08/23 Exercises: Strengthening: Nustep for 7 minutes at level 6 while assessing patient  Step up on foam and down working with hip strength Step over the agility poles forward 10 x then sideways Step up on 6 inch step 10 x each leg Sitting moving the 3# wt diagonally to strengthen the abdominals.  Sit to stand with using the hands the crease 10 x 2                                                                                                                      PATIENT EDUCATION:  08/07/23 Education details: Access Code: [email or web address], how to fill out bladder diary, urge to void when have the urge to urinate Person educated: Patient Education method: Explanation, Demonstration, Tactile cues, Verbal cues, and Handouts Education comprehension: verbalized understanding, returned demonstration, verbal cues required, tactile cues required, and needs further education  HOME EXERCISE PROGRAM: 07/31/23 Access Code: [email or web address] URL: [email or web address] Date: 07/31/2023 Prepared by: Eulis Foster  Program Notes use the estrogen cream around the urethra  Exercises - Hooklying Transversus Abdominis Palpation  - 1  x daily - 7 x weekly - 1 sets - 10 reps - Supine Bridge with Mini Swiss Ball Between Knees  - 1 x daily - 7 x weekly - 1 sets - 10 reps - Hooklying Isometric Hip Flexion  - 1 x daily - 7 x weekly - 2 sets - 10 reps - Seated Hip Adduction Squeeze with Ball  - 1 x daily - 7 x weekly - 1 sets - 10 reps - 3 sec hold - Seated Abdominal Press into [employer]  - 1 x daily - 3 x weekly - 1 sets - 10 reps - 3 sec hold - Seated Scapular Protraction with Resistance  - 1 x daily - 3 x weekly - 2 sets - 10 reps - Seated Elbow Flexion with Resistance Under Foot  - 1 x daily - 3 x weekly - 2 sets - 10 reps  ASSESSMENT:  CLINICAL IMPRESSION: Patient is a 89 y.o. female who was seen today for physical therapy treatment for urge incontinence and nocturia. Patient woke up 2 times and was  able to get to the bathroom without leaking. Patient is going to the bathroom even without the urge to urinate.  Patient changes her pads 1 times per day and uses 1 during the night.  Increased in bilateral hip abduction strength. Patient is taking Gemtesa and with physical therapy has been helping her with her leakage and the urinary urgency. She is able to contract her lower abdominal without pushing it out. Patient needs skilled therapy to improve pelvic floor strength and coordination to reduce urinary leakage.   OBJECTIVE IMPAIRMENTS: decreased coordination, decreased strength, and pain.   ACTIVITY LIMITATIONS: continence, toileting, and locomotion level  PARTICIPATION LIMITATIONS:  home life  PERSONAL FACTORS: Age, Fitness, and 3+ comorbidities: Ambulates with a cane with leg braces for foot drop since hospitalization for Wegener granulomatosis diagnosis in 2020 managed by rheumatology ; IBS; Hypothyroid; Abdominal hysterectomy;   are also affecting patient's functional outcome.   REHAB POTENTIAL: Good  CLINICAL DECISION MAKING: Evolving/moderate complexity  EVALUATION COMPLEXITY: Moderate   GOALS: Goals reviewed with patient? Yes  SHORT TERM GOALS: Target date: 08/21/23  Patient is independent with initial HEP for pelvic floor and core.  Baseline: Goal status: Met 08/07/23  2.  Patient is educated on urge to void to deter urination.  Baseline:  Goal status: Met 08/07/23  3.  Patient is able to wait 30-45 minutes to urinate while at home.  Baseline:  Goal status: Met 08/14/23   LONG TERM GOALS: Target date: 12/11/23  Patient is independent with advanced HEP for pelvic floor and core strength.  Baseline:  Goal status: ongoing 10/06/23  2.  Patient reports she is able to wait >/= 1.5 hours to urinate when at home during the day due to improved coordination of the pelvic floor.  Baseline:  Goal status: Met 08/14/23  3.  Patient is able to urinate during the night </= 2 times  per night due to the ability to use the urge to void behavioral technique.  Baseline: last night woke up 2 times Goal status: met 09/22/23  4.  Patient is able to wear 1 pull up per day and 1 per night due to her reduction of urinary leakage.  Baseline: 1 pull ups during the day and 1 at night.  Goal status: Met 10/06/23  5.  Burning during urinate decreased >/= 25% due to the use of her estrogen cream and not wearing as many pull ups.  Baseline:  Goal status: Met 09/08/23   PLAN:  PT FREQUENCY: 1x/week  PT DURATION: 8 weeks  PLANNED INTERVENTIONS: 97110-Therapeutic exercises, 97530- Therapeutic activity, 97112- Neuromuscular re-education, [id number]- Manual therapy, Patient/Family education, and Biofeedback  PLAN FOR NEXT SESSION: continue with strengthening in standing.   Eulis Foster, PT 10/06/23 11:42 AM

## 2023-10-15 ENCOUNTER — Encounter: Payer: Self-pay | Admitting: Podiatry

## 2023-10-15 ENCOUNTER — Ambulatory Visit (INDEPENDENT_AMBULATORY_CARE_PROVIDER_SITE_OTHER): Payer: Medicare Other | Admitting: Podiatry

## 2023-10-15 ENCOUNTER — Ambulatory Visit: Payer: Medicare Other | Admitting: Podiatry

## 2023-10-15 DIAGNOSIS — M79674 Pain in right toe(s): Secondary | ICD-10-CM | POA: Diagnosis not present

## 2023-10-15 DIAGNOSIS — B351 Tinea unguium: Secondary | ICD-10-CM | POA: Diagnosis not present

## 2023-10-15 DIAGNOSIS — M79675 Pain in left toe(s): Secondary | ICD-10-CM

## 2023-10-16 NOTE — Progress Notes (Signed)
 Subjective:   Patient ID: Emily Cannon, female   DOB: 89 y.o.   MRN: [id number]   HPI Patient presents with thickened incurvated nailbeds 1-5 both feet hard to wear shoe gear with comfortably   ROS      Objective:  Physical Exam  Neurovascular status intact thick yellow brittle nailbeds 1-5 both feet impossible for her caregiver to take care of     Assessment:  Chronic mycotic nail infection 1-5 both feet painful     Plan:  Debridement nailbeds 1-5 both feet no intra genic bleeding reappoint routine care

## 2023-10-20 ENCOUNTER — Encounter: Payer: Self-pay | Admitting: Physical Therapy

## 2023-10-20 ENCOUNTER — Ambulatory Visit: Payer: Self-pay | Admitting: Physical Therapy

## 2023-10-20 DIAGNOSIS — R3915 Urgency of urination: Secondary | ICD-10-CM | POA: Diagnosis not present

## 2023-10-20 DIAGNOSIS — M6281 Muscle weakness (generalized): Secondary | ICD-10-CM

## 2023-10-20 NOTE — Therapy (Signed)
 OUTPATIENT PHYSICAL THERAPY FEMALE PELVIC TREATMENT   Patient Name: Emily Cannon MRN: [id number] DOB:04-03-1933, 89 y.o., female Today's Date: 10/20/2023   END OF SESSION:  PT End of Session - 10/20/23 1110     Visit Number 11    Date for PT Re-Evaluation 12/11/23    Authorization Type Medicare    Authorization - Number of Visits 11    Progress Note Due on Visit 20    PT Start Time 1100    PT Stop Time 1140    PT Time Calculation (min) 40 min    Activity Tolerance Patient tolerated treatment well    Behavior During Therapy WFL for tasks assessed/performed               Past Medical History:  Diagnosis Date   Allergic rhinitis, cause unspecified    Allergy    Anxiety state, unspecified    Blood transfusion without reported diagnosis    2020   Cataract    removed both eyes    Chronic kidney disease    Disorder of bone and cartilage, unspecified    Esophageal candidiasis (HCC)    Foot drop    Gastric ulcer    History of CMV    History of Helicobacter pylori infection    Hypothyroid    Irritable bowel syndrome    Lumbago    Microscopic polyangiitis (HCC)    Other and unspecified hyperlipidemia    Pancreatic cyst    Perforation of tympanic membrane, unspecified    Unspecified essential hypertension    Past Surgical History:  Procedure Laterality Date   ABDOMINAL HYSTERECTOMY     BIOPSY  01/02/2019   Procedure: BIOPSY;  Surgeon: Benancio Deeds, MD;  Location: [facility] ENDOSCOPY;  Service: Gastroenterology;;   cataract surgery  5/12 and 12/24/2012   both eyes   COLONOSCOPY     COLONOSCOPY WITH PROPOFOL N/A 01/02/2019   Procedure: COLONOSCOPY WITH PROPOFOL;  Surgeon: Benancio Deeds, MD;  Location: [facility] ENDOSCOPY;  Service: Gastroenterology;  Laterality: N/A;   ESOPHAGOGASTRODUODENOSCOPY (EGD) WITH PROPOFOL N/A 01/02/2019   Procedure: ESOPHAGOGASTRODUODENOSCOPY (EGD) WITH PROPOFOL;  Surgeon: Benancio Deeds, MD;  Location: [facility] ENDOSCOPY;  Service:  Gastroenterology;  Laterality: N/A;   NSVD     x1   POLYPECTOMY  01/02/2019   Procedure: POLYPECTOMY;  Surgeon: Benancio Deeds, MD;  Location: [facility] ENDOSCOPY;  Service: Gastroenterology;;   POLYPECTOMY     TONSILLECTOMY     UPPER GASTROINTESTINAL ENDOSCOPY     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) 09/25/2020   Peripheral neuropathy 05/08/2020   Weakness 03/28/2020   Gait abnormality 03/28/2020   Paresthesia 03/28/2020   Left foot drop 03/23/2020   Heme positive stool    Benign neoplasm of colon    Acute blood loss anemia 12/30/2018   Symptomatic anemia 12/15/2018   AKI (acute kidney injury) (HCC) 12/15/2018   Hypomagnesemia 12/15/2018   Steroid-induced hyperglycemia 12/15/2018   Microscopic polyangiitis (HCC) 11/13/2018   Positive P-ANCA titer 10/14/2018   Viral URI 05/12/2018   Syncope 06/05/2016   Venous (peripheral) insufficiency 11/23/2012   Dermatitis 11/10/2012   Hypothyroidism 05/22/2011   Perforation of tympanic membrane 08/08/2007   Osteopenia 07/27/2007   Hyperlipidemia 05/11/2007   Anxiety state 05/11/2007  Essential hypertension 05/11/2007   Allergic rhinitis 05/11/2007   Irritable bowel syndrome 05/11/2007   LOW BACK PAIN 05/11/2007   Acquired absence of genital organ 05/11/2007    PCP: Kristian Covey, MD  REFERRING PROVIDER: Loleta Chance, MD   REFERRING DIAG:  N39.41 (ICD-10-CM) - Urge urinary incontinence  R35.1 (ICD-10-CM) - Nocturia    THERAPY DIAG:  Urinary urgency  Muscle weakness (generalized)  Rationale for Evaluation and Treatment: Rehabilitation  ONSET DATE: 2022  SUBJECTIVE:                                                                                                                                                                                            SUBJECTIVE STATEMENT: I am not wetting my underwear.   Fluid intake:  36oz water per day, 4 cups of coffee intake    PAIN:  Are you having pain? Yes NPRS scale: 5/10 Pain location: Vaginal  Pain type: burning Pain description: intermittent   Aggravating factors: when urinating Relieving factors: not urinating  PRECAUTIONS: None  RED FLAGS: None   WEIGHT BEARING RESTRICTIONS: No  FALLS:  Has patient fallen in last 6 months? No  LIVING ENVIRONMENT: Lives with: lives with their spouse   OCCUPATION: retired  PLOF: Independent  PATIENT GOALS: reduce the urinary leakage and reduce the number pads.   PERTINENT HISTORY:  Ambulates with a cane with leg braces for foot drop since hospitalization for Wegener granulomatosis diagnosis in 2020 managed by rheumatology ; IBS; Hypothyroid; Abdominal hysterectomy;    URINATION: Pain with urination: Yes Fully empty bladder: sit to lean forward to fully empty her bladder;  dribbling after finishing, and the need to urinate multiple times in a row  Stream: Weak Urgency: Yes: while walking to the bathroom and while she is getting her underwear off Frequency: Day time voids >15.  Nocturia: 2-5 times per night to void.  Leakage: Urge to void, Walking to the bathroom, Coughing, Sneezing, Laughing, Exercise, and going from sitting to  standing Pads: 1 adult diapers per day    PREGNANCY: Vaginal deliveries 1 Tearing No  PROLAPSE: None   OBJECTIVE:  Note: Objective measures were completed at Evaluation unless otherwise noted.  DIAGNOSTIC FINDINGS:  Pelvic floor strength V/V, puborectalis V/V  Pelvic floor musculature: Right levator tender, Right obturator non-tender, Left levator non-tender, Left obturator non-tender  Post Void Residual 47 mL     COGNITION: Overall cognitive status: Within functional limits for tasks  assessed      GAIT:  Assistive device utilized: Walker - 4 wheeled Level of assistance: Complete Independence Comments: She wears braces on her lower legs  POSTURE: No Significant postural limitations  PELVIC ALIGNMENT:   LOWER EXTREMITY ROM:  Passive ROM Right eval Left eval Right 10/06/23 Left  10/06/23  Hip internal rotation 10 20 30 25   Hip external rotation 35 30 30 30    (Blank rows = not tested)  LOWER EXTREMITY MMT:  MMT Right eval Left eval Right/left 08/21/23 Right/left 09/22/23 Right/Left 10/06/23  Hip extension 4/5 4/5 4/5 4/5 4/5  Hip abduction 3+/5 3+/5 3+/5 4/5 4/5   PALPATION:   General  right lower quadrant tenderness; Patient will bulge the lower abdomen when contracting, patient will bulge her abdomen when contracting the pelvic floor.              Patient confirms identification and approves PT to assess internal pelvic floor and treatment No         TODAY'S TREATMENT:   10/20/23 Neuromuscular re-education: Balance : Step over the agility poles forward 10 x then sideways holding bar and VC to step high Step up and over 6 inch step 10 x  Sit to stand holding a 5# kettle bell 15 x Exercises: Strengthening: Nustep for 7 minutes at level 6 while assessing patient Side step with yellow band around her thighs 10 x  Standing hip extension with yellow band around thighs 20 x  Marching with yellow band around thighs 20 x  Knee extension with ball squeeze and yellow band around ankles Sitting holding 5# kettle bell moving it up and down with 2 hands 12 x Sitting and push 5# kettle bell forward and back chest level 10 x    10/06/23 Exercises: Strengthening: Nustep for 7 minutes at level 6 while assessing patient Supine alternate hip flexion with blue round band and shoulder flexion with 1# in each hand Bridge with hip abduction and blue band around knees Sitting moving the 3# wt diagonally to strengthen the abdominals.2 x 10 each with ball  squeeze Step over the agility poles forward 10 x then sideways holding bar and VC to step high  Step up on 6 inch step 10 x each leg    09/22/23 Exercises: Strengthening: Walking for 6 minutes to increase my endurance with rolling walker Bridging 20 x with cues to push up further Supine hip abduction with yellow tube band 20 x  Supine marching with yellow tube band 20 x Sitting moving the 3# wt diagonally to strengthen the abdominals.2 x 10 each Sit to stand with using hand to press into groin to assist with standing up    09/08/23 Exercises: Strengthening: Nustep for 7 minutes at level 6 while assessing patient  Step up on foam and down working with hip strength Step over the agility poles forward 10 x then sideways Step up on 6 inch step 10 x each leg Sitting moving the 3# wt diagonally to strengthen the abdominals.  Sit to stand with using the hands the crease 10 x 2  PATIENT EDUCATION:  08/07/23 Education details: Access Code: [email or web address], how to fill out bladder diary, urge to void when have the urge to urinate Person educated: Patient Education method: Explanation, Demonstration, Tactile cues, Verbal cues, and Handouts Education comprehension: verbalized understanding, returned demonstration, verbal cues required, tactile cues required, and needs further education  HOME EXERCISE PROGRAM: 07/31/23 Access Code: [email or web address] URL: [email or web address] Date: 07/31/2023 Prepared by: Eulis Foster  Program Notes use the estrogen cream around the urethra  Exercises - Hooklying Transversus Abdominis Palpation  - 1 x daily - 7 x weekly - 1 sets - 10 reps - Supine Bridge with Mini Swiss Ball Between Knees  - 1 x daily - 7 x weekly - 1 sets - 10 reps - Hooklying Isometric Hip Flexion  - 1 x daily - 7 x weekly - 2 sets - 10 reps - Seated Hip Adduction Squeeze  with Ball  - 1 x daily - 7 x weekly - 1 sets - 10 reps - 3 sec hold - Seated Abdominal Press into [employer]  - 1 x daily - 3 x weekly - 1 sets - 10 reps - 3 sec hold - Seated Scapular Protraction with Resistance  - 1 x daily - 3 x weekly - 2 sets - 10 reps - Seated Elbow Flexion with Resistance Under Foot  - 1 x daily - 3 x weekly - 2 sets - 10 reps  ASSESSMENT:  CLINICAL IMPRESSION: Patient is a 89 y.o. female who was seen today for physical therapy treatment for urge incontinence and nocturia. Patient woke up 2 times and was able to get to the bathroom without leaking. Patient is able to go from sit to stand holding a 5 # wt and not use her hands to help her. She is walking faster. She is able to now lift her legs over the agility poles without swinging leg out to side.  Patient needs skilled therapy to improve pelvic floor strength and coordination to reduce urinary leakage.   OBJECTIVE IMPAIRMENTS: decreased coordination, decreased strength, and pain.   ACTIVITY LIMITATIONS: continence, toileting, and locomotion level  PARTICIPATION LIMITATIONS:  home life  PERSONAL FACTORS: Age, Fitness, and 3+ comorbidities: Ambulates with a cane with leg braces for foot drop since hospitalization for Wegener granulomatosis diagnosis in 2020 managed by rheumatology ; IBS; Hypothyroid; Abdominal hysterectomy;   are also affecting patient's functional outcome.   REHAB POTENTIAL: Good  CLINICAL DECISION MAKING: Evolving/moderate complexity  EVALUATION COMPLEXITY: Moderate   GOALS: Goals reviewed with patient? Yes  SHORT TERM GOALS: Target date: 08/21/23  Patient is independent with initial HEP for pelvic floor and core.  Baseline: Goal status: Met 08/07/23  2.  Patient is educated on urge to void to deter urination.  Baseline:  Goal status: Met 08/07/23  3.  Patient is able to wait 30-45 minutes to urinate while at home.  Baseline:  Goal status: Met 08/14/23   LONG TERM GOALS: Target date:  12/11/23  Patient is independent with advanced HEP for pelvic floor and core strength.  Baseline:  Goal status: ongoing 10/06/23  2.  Patient reports she is able to wait >/= 1.5 hours to urinate when at home during the day due to improved coordination of the pelvic floor.  Baseline:  Goal status: Met 08/14/23  3.  Patient is able to urinate during the night </= 2 times per night due to the ability to use the urge to void behavioral technique.  Baseline: last night woke up 2 times Goal  status: met 09/22/23  4.  Patient is able to wear 1 pull up per day and 1 per night due to her reduction of urinary leakage.  Baseline: 1 pull ups during the day and 1 at night.  Goal status: Met 10/06/23  5.  Burning during urinate decreased >/= 25% due to the use of her estrogen cream and not wearing as many pull ups.  Baseline:  Goal status: Met 09/08/23   PLAN:  PT FREQUENCY: 1x/week  PT DURATION: 8 weeks  PLANNED INTERVENTIONS: 97110-Therapeutic exercises, 97530- Therapeutic activity, 97112- Neuromuscular re-education, [id number]- Manual therapy, Patient/Family education, and Biofeedback  PLAN FOR NEXT SESSION: continue with strengthening in standing, possible discharge   Eulis Foster, PT 10/20/23 11:46 AM

## 2023-10-27 ENCOUNTER — Encounter: Payer: Self-pay | Admitting: Physical Therapy

## 2023-10-27 ENCOUNTER — Ambulatory Visit: Payer: Self-pay | Admitting: Physical Therapy

## 2023-10-27 DIAGNOSIS — R3915 Urgency of urination: Secondary | ICD-10-CM | POA: Diagnosis not present

## 2023-10-27 DIAGNOSIS — M6281 Muscle weakness (generalized): Secondary | ICD-10-CM

## 2023-10-27 NOTE — Therapy (Signed)
 OUTPATIENT PHYSICAL THERAPY FEMALE PELVIC TREATMENT   Patient Name: Emily Cannon MRN: [id number] DOB:November 25, 1932, 89 y.o., female Today's Date: 10/27/2023   END OF SESSION:  PT End of Session - 10/27/23 1107     Visit Number 12    Date for PT Re-Evaluation 12/11/23    Authorization Type Medicare    Authorization - Number of Visits 12    Progress Note Due on Visit 20    PT Start Time 1100    PT Stop Time 1140    PT Time Calculation (min) 40 min    Activity Tolerance Patient tolerated treatment well    Behavior During Therapy WFL for tasks assessed/performed               Past Medical History:  Diagnosis Date   Allergic rhinitis, cause unspecified    Allergy    Anxiety state, unspecified    Blood transfusion without reported diagnosis    2020   Cataract    removed both eyes    Chronic kidney disease    Disorder of bone and cartilage, unspecified    Esophageal candidiasis (HCC)    Foot drop    Gastric ulcer    History of CMV    History of Helicobacter pylori infection    Hypothyroid    Irritable bowel syndrome    Lumbago    Microscopic polyangiitis (HCC)    Other and unspecified hyperlipidemia    Pancreatic cyst    Perforation of tympanic membrane, unspecified    Unspecified essential hypertension    Past Surgical History:  Procedure Laterality Date   ABDOMINAL HYSTERECTOMY     BIOPSY  01/02/2019   Procedure: BIOPSY;  Surgeon: Benancio Deeds, MD;  Location: [facility] ENDOSCOPY;  Service: Gastroenterology;;   cataract surgery  5/12 and 12/24/2012   both eyes   COLONOSCOPY     COLONOSCOPY WITH PROPOFOL N/A 01/02/2019   Procedure: COLONOSCOPY WITH PROPOFOL;  Surgeon: Benancio Deeds, MD;  Location: [facility] ENDOSCOPY;  Service: Gastroenterology;  Laterality: N/A;   ESOPHAGOGASTRODUODENOSCOPY (EGD) WITH PROPOFOL N/A 01/02/2019   Procedure: ESOPHAGOGASTRODUODENOSCOPY (EGD) WITH PROPOFOL;  Surgeon: Benancio Deeds, MD;  Location: [facility] ENDOSCOPY;  Service:  Gastroenterology;  Laterality: N/A;   NSVD     x1   POLYPECTOMY  01/02/2019   Procedure: POLYPECTOMY;  Surgeon: Benancio Deeds, MD;  Location: [facility] ENDOSCOPY;  Service: Gastroenterology;;   POLYPECTOMY     TONSILLECTOMY     UPPER GASTROINTESTINAL ENDOSCOPY     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) 09/25/2020   Peripheral neuropathy 05/08/2020   Weakness 03/28/2020   Gait abnormality 03/28/2020   Paresthesia 03/28/2020   Left foot drop 03/23/2020   Heme positive stool    Benign neoplasm of colon    Acute blood loss anemia 12/30/2018   Symptomatic anemia 12/15/2018   AKI (acute kidney injury) (HCC) 12/15/2018   Hypomagnesemia 12/15/2018   Steroid-induced hyperglycemia 12/15/2018   Microscopic polyangiitis (HCC) 11/13/2018   Positive P-ANCA titer 10/14/2018   Viral URI 05/12/2018   Syncope 06/05/2016   Venous (peripheral) insufficiency 11/23/2012   Dermatitis 11/10/2012   Hypothyroidism 05/22/2011   Perforation of tympanic membrane 08/08/2007   Osteopenia 07/27/2007   Hyperlipidemia 05/11/2007   Anxiety state 05/11/2007  Essential hypertension 05/11/2007   Allergic rhinitis 05/11/2007   Irritable bowel syndrome 05/11/2007   LOW BACK PAIN 05/11/2007   Acquired absence of genital organ 05/11/2007    PCP: Kristian Covey, MD  REFERRING PROVIDER: Loleta Chance, MD   REFERRING DIAG:  N39.41 (ICD-10-CM) - Urge urinary incontinence  R35.1 (ICD-10-CM) - Nocturia    THERAPY DIAG:  Urinary urgency  Muscle weakness (generalized)  Rationale for Evaluation and Treatment: Rehabilitation  ONSET DATE: 2022  SUBJECTIVE:                                                                                                                                                                                            SUBJECTIVE STATEMENT: I am not wetting my underwear.   Fluid intake:  36oz water per day, 4 cups of coffee intake    PAIN:  Are you having pain? Yes NPRS scale: 5/10 Pain location: Vaginal  Pain type: burning Pain description: intermittent   Aggravating factors: when urinating Relieving factors: not urinating  PRECAUTIONS: None  RED FLAGS: None   WEIGHT BEARING RESTRICTIONS: No  FALLS:  Has patient fallen in last 6 months? No  LIVING ENVIRONMENT: Lives with: lives with their spouse   OCCUPATION: retired  PLOF: Independent  PATIENT GOALS: reduce the urinary leakage and reduce the number pads.   PERTINENT HISTORY:  Ambulates with a cane with leg braces for foot drop since hospitalization for Wegener granulomatosis diagnosis in 2020 managed by rheumatology ; IBS; Hypothyroid; Abdominal hysterectomy;    URINATION: Pain with urination: Yes Fully empty bladder: sit to lean forward to fully empty her bladder;  dribbling after finishing, and the need to urinate multiple times in a row  Stream: Weak Urgency: Yes: while walking to the bathroom and while she is getting her underwear off Frequency: Day time voids >15.  Nocturia: 2-5 times per night to void.  10/27/23: Nocturia 2 times; Day time is 6 times Leakage: Urge to void, Walking to the bathroom, Coughing, Sneezing, Laughing, Exercise, and going from sitting to  standing Pads: 1 adult diapers per day    PREGNANCY: Vaginal deliveries 1 Tearing No  PROLAPSE: None   OBJECTIVE:  Note: Objective measures were completed at Evaluation unless otherwise noted.  DIAGNOSTIC FINDINGS:  Pelvic floor strength V/V, puborectalis V/V  Pelvic floor musculature: Right levator tender, Right obturator non-tender, Left levator non-tender, Left obturator non-tender  Post Void Residual 47 mL     COGNITION: Overall cognitive status: Within  functional limits for tasks assessed      GAIT:  Assistive device utilized: Walker - 4 wheeled Level of assistance: Complete  Independence Comments: She wears braces on her lower legs  POSTURE: No Significant postural limitations  PELVIC ALIGNMENT:   LOWER EXTREMITY ROM:  Passive ROM Right eval Left eval Right 10/06/23 Left  10/06/23  Hip internal rotation 10 20 30 25   Hip external rotation 35 30 30 30    (Blank rows = not tested)  LOWER EXTREMITY MMT:  MMT Right eval Left eval Right/left 08/21/23 Right/left 09/22/23 Right/Left 10/06/23 Right/left 10/27/23  Hip extension 4/5 4/5 4/5 4/5 4/5 4/5  Hip abduction 3+/5 3+/5 3+/5 4/5 4/5 4/5   PALPATION:   General  right lower quadrant tenderness; Patient will bulge the lower abdomen when contracting, patient will bulge her abdomen when contracting the pelvic floor.              Patient confirms identification and approves PT to assess internal pelvic floor and treatment No         TODAY'S TREATMENT:   10/27/23 Neuromuscular re-education: Balance: Step over the agility poles forward 10 x then sideways holding bar and VC to step high Sit to stand holding a 5# kettle bell 15 x Step up 6 inch step 10 x each leg Side step with yellow band around her thighs 10 x with big steps Exercises: Strengthening: Nustep for 11 minutes at level 5 while assessing patient Sitting holding 5# kettle bell moving it up and down with 2 hands 10 x Knee extension with ball squeeze and yellow band around ankles Marching in sitting with 1 # wt in each hand and yellow tube band around thighs    10/20/23 Neuromuscular re-education: Balance : Step over the agility poles forward 10 x then sideways holding bar and VC to step high Step up and over 6 inch step 10 x  Sit to stand holding a 5# kettle bell 15 x Exercises: Strengthening: Nustep for 7 minutes at level 6 while assessing patient Side step with yellow band around her thighs 10 x   Standing hip extension with yellow band around thighs 20 x  Marching with yellow band around thighs 20 x  Knee extension with ball squeeze and yellow band around ankles Sitting holding 5# kettle bell moving it up and down with 2 hands 12 x Sitting and push 5# kettle bell forward and back chest level 10 x    10/06/23 Exercises: Strengthening: Nustep for 7 minutes at level 6 while assessing patient Supine alternate hip flexion with blue round band and shoulder flexion with 1# in each hand Bridge with hip abduction and blue band around knees Sitting moving the 3# wt diagonally to strengthen the abdominals.2 x 10 each with ball squeeze Step over the agility poles forward 10 x then sideways holding bar and VC to step high  Step up on 6 inch step 10 x each leg                                                     PATIENT EDUCATION:  08/07/23 Education details: Access Code: [email or web address], how to fill out bladder diary, urge to void when have the urge to urinate Person educated: Patient Education method: Explanation, Demonstration, Tactile cues, Verbal cues, and Handouts Education comprehension: verbalized understanding, returned demonstration, verbal cues required, tactile cues required, and needs further education  HOME EXERCISE PROGRAM: 07/31/23 Access Code: [email or web address] URL: [email or web address] Date: 07/31/2023 Prepared by: Elnita Maxwell  Wallace Cullens  Program Notes use the estrogen cream around the urethra  Exercises - Hooklying Transversus Abdominis Palpation  - 1 x daily - 7 x weekly - 1 sets - 10 reps - Supine Bridge with Mini Swiss Ball Between Knees  - 1 x daily - 7 x weekly - 1 sets - 10 reps - Hooklying Isometric Hip Flexion  - 1 x daily - 7 x weekly - 2 sets - 10 reps - Seated Hip Adduction Squeeze with Ball  - 1 x daily - 7 x weekly - 1 sets - 10 reps - 3 sec hold - Seated Abdominal Press into [employer]  - 1 x daily - 3 x weekly - 1 sets - 10 reps - 3 sec hold - Seated Scapular  Protraction with Resistance  - 1 x daily - 3 x weekly - 2 sets - 10 reps - Seated Elbow Flexion with Resistance Under Foot  - 1 x daily - 3 x weekly - 2 sets - 10 reps  ASSESSMENT:  CLINICAL IMPRESSION: Patient is a 89 y.o. female who was seen today for physical therapy treatment for urge incontinence and nocturia. Patient woke up 2 times and was able to get to the bathroom without leaking. Patient is able to empty her bladder 80% better. Her incontinence diapers are 95% dryer. Patient will go to the bathroom 6 times during the day compared to the 15 times. She is able to walk to the bathroom without leaking. Patient is independent with her HEP.   OBJECTIVE IMPAIRMENTS: decreased coordination, decreased strength, and pain.   ACTIVITY LIMITATIONS: continence, toileting, and locomotion level  PARTICIPATION LIMITATIONS:  home life  PERSONAL FACTORS: Age, Fitness, and 3+ comorbidities: Ambulates with a cane with leg braces for foot drop since hospitalization for Wegener granulomatosis diagnosis in 2020 managed by rheumatology ; IBS; Hypothyroid; Abdominal hysterectomy;   are also affecting patient's functional outcome.   REHAB POTENTIAL: Good  CLINICAL DECISION MAKING: Evolving/moderate complexity  EVALUATION COMPLEXITY: Moderate   GOALS: Goals reviewed with patient? Yes  SHORT TERM GOALS: Target date: 08/21/23  Patient is independent with initial HEP for pelvic floor and core.  Baseline: Goal status: Met 08/07/23  2.  Patient is educated on urge to void to deter urination.  Baseline:  Goal status: Met 08/07/23  3.  Patient is able to wait 30-45 minutes to urinate while at home.  Baseline:  Goal status: Met 08/14/23   LONG TERM GOALS: Target date: 12/11/23  Patient is independent with advanced HEP for pelvic floor and core strength.  Baseline:  Goal status: Met  10/27/23  2.  Patient reports she is able to wait >/= 1.5 hours to urinate when at home during the day due to improved  coordination of the pelvic floor.  Baseline:  Goal status: Met 08/14/23  3.  Patient is able to urinate during the night </= 2 times per night due to the ability to use the urge to void behavioral technique.  Baseline: last night woke up 2 times Goal status: met 09/22/23  4.  Patient is able to wear 1 pull up per day and 1 per night due to her reduction of urinary leakage.  Baseline: 1 pull ups during the day and 1 at night.  Goal status: Met 10/06/23  5.  Burning during urinate decreased >/= 25% due to the use of her estrogen cream and not wearing as many pull ups.  Baseline:  Goal status: Met 09/08/23   PLAN:Discharge to HEP  this visit   Eulis Foster, PT 10/27/23 11:08 AM   PHYSICAL THERAPY DISCHARGE SUMMARY  Visits from Start of Care: 12  Current functional level related to goals / functional outcomes: See above.    Remaining deficits: See above.    Education / Equipment: HEP   Patient agrees to discharge. Patient goals were met. Patient is being discharged due to meeting the stated rehab goals. Thank you for the referral.   Eulis Foster, PT 10/27/23 11:09 AM

## 2023-10-31 ENCOUNTER — Telehealth: Payer: Self-pay

## 2023-10-31 DIAGNOSIS — R351 Nocturia: Secondary | ICD-10-CM

## 2023-10-31 DIAGNOSIS — N3941 Urge incontinence: Secondary | ICD-10-CM

## 2023-10-31 NOTE — Telephone Encounter (Signed)
 Emily Cannon is a 89 y.o. female called in because the Leslye Peer is going to cost them $600. Pt's husband said he picked it up 1 time but can't afford to do it again. Pt my need a tier exception or alternative medication.

## 2023-11-01 MED ORDER — MIRABEGRON ER 25 MG PO TB24: 25.0000 mg | ORAL_TABLET | Freq: Every day | ORAL | 0 refills | Status: DC

## 2023-11-01 NOTE — Telephone Encounter (Signed)
 Rx changed to Mirabegron to assess cost.    For Beta-3 agonist medication,there is a potential side effect of elevated blood pressure which is more likely to occur in individuals with uncontrolled hypertension. It appears that ger most recent blood pressure is within normal limits 120s/70s. Please monitor  blood pressure and stop the medication if she experience any headache, chest discomfort, or shortness of breath and seek care immediately.  Prescription of mirabegron to your pharmacy. Start at 25mg  daily for 1 month, if blood pressure remains unchanged, increase to 50mg  after 1 month and continue to monitor blood pressure.

## 2023-11-01 NOTE — Addendum Note (Signed)
 Addended byWyatt Haste T on: 11/01/2023 12:18 AM   Modules accepted: Orders

## 2023-11-03 NOTE — Telephone Encounter (Signed)
 Patient and husband Rosanne Ashing have been given the new updated information on the medication changes. They will call back in a month and let us know the progress.

## 2023-11-24 ENCOUNTER — Other Ambulatory Visit: Payer: Self-pay | Admitting: Family Medicine

## 2023-12-05 ENCOUNTER — Encounter: Payer: Self-pay | Admitting: Obstetrics

## 2023-12-05 ENCOUNTER — Other Ambulatory Visit (HOSPITAL_COMMUNITY)
Admission: RE | Admit: 2023-12-05 | Discharge: 2023-12-05 | Disposition: A | Discharge status: Home / Self Care | Source: Ambulatory Visit | Attending: Obstetrics | Admitting: Obstetrics

## 2023-12-05 ENCOUNTER — Ambulatory Visit (INDEPENDENT_AMBULATORY_CARE_PROVIDER_SITE_OTHER): Payer: Medicare Other | Admitting: Obstetrics

## 2023-12-05 VITALS — BP 135/62 | HR 62

## 2023-12-05 DIAGNOSIS — N898 Other specified noninflammatory disorders of vagina: Secondary | ICD-10-CM | POA: Diagnosis present

## 2023-12-05 DIAGNOSIS — N3941 Urge incontinence: Secondary | ICD-10-CM

## 2023-12-05 DIAGNOSIS — R351 Nocturia: Secondary | ICD-10-CM

## 2023-12-05 MED ORDER — TROSPIUM CHLORIDE ER 60 MG PO CP24
1.0000 | ORAL_CAPSULE | Freq: Every day | ORAL | 2 refills | Status: DC
Start: 2023-12-05 — End: 2023-12-10

## 2023-12-05 MED ORDER — FLUCONAZOLE 150 MG PO TABS: 150.0000 mg | ORAL_TABLET | Freq: Once | ORAL | 1 refills | Status: AC

## 2023-12-05 NOTE — Assessment & Plan Note (Addendum)
-   We previously discussed the symptoms of overactive bladder (OAB), which include urinary urgency, urinary frequency, nocturia, with or without urge incontinence.  While we do not know the exact etiology of OAB, several treatment options exist. We discussed management including behavioral therapy (decreasing bladder irritants, urge suppression strategies, timed voids, bladder retraining), physical therapy, medication; for refractory cases posterior tibial nerve stimulation, sacral neuromodulation, and intravesical botulinum toxin injection.  For anticholinergic medications, we discussed the potential side effects of anticholinergics including dry eyes, dry mouth, constipation, cognitive impairment and urinary retention. For Beta-3 agonist medication, we discussed the potential side effect of elevated blood pressure which is more likely to occur in individuals with uncontrolled hypertension. - initial improvement with Gemtesa  (around $100/month), continues despite cost due to prior relief. Mirabegron  also cost prohibitive - reviewed possible change to Trospium, Rx printed for pt's husband per request to review cost. Discussed [facility] and possible assistance from her son due to reduced cost - reduce caffeine intake - completed pelvic floor PT and continue bladder training  - consider change in vaginal estrogen formulation if it persist after reduction to maintenance dosing 1g 2x/week due to return of symptoms at start of estrogen - consider functional causes of incontinence due to gait disturbance and cane use

## 2023-12-05 NOTE — Patient Instructions (Addendum)
 Reduce your vaginal estrogen to 1g twice a week.   - discussed proper vulvar care, warm compression, avoid pad use, cotton only underwear and barrier ointment if needed   Continue Gemtesa  1 tab daily.  Start diflucan  by mouth, repeat dose if your symptoms improve and return in 72 hours.   We discussed the symptoms of overactive bladder (OAB), which include urinary urgency, urinary frequency, night-time urination, with or without urge incontinence.  We discussed management including behavioral therapy (decreasing bladder irritants by following a bladder diet, urge suppression strategies, timed voids, bladder retraining), physical therapy, medication; and for refractory cases posterior tibial nerve stimulation, sacral neuromodulation, and intravesical botulinum toxin injection.   For anticholinergic medications, we discussed the potential side effects of anticholinergics including dry eyes, dry mouth, constipation, rare risks of cognitive impairment and urinary retention. You were given prescription for trospium 60mg  daily to assess cost. Do not take with gemtesa , use one or the other.   It can take a month to start working so give it time, but if you have bothersome side effects call sooner and we can try a different medication.  Call us  if you have trouble filling the prescription or if it's not covered by your insurance.

## 2023-12-05 NOTE — Assessment & Plan Note (Signed)
-   initial 80% relief after gemtesa  and PFPT, returned to 5x/night due to vaginal irritation - trial of trospium if covered by insurance and consider changing vaginal estrogen formulation - limited fluid intake after dinner - continue elevating legs during daytime to increase venous return - switch lasix  to 2pm dosing - denies snoring or history of OSA

## 2023-12-05 NOTE — Progress Notes (Signed)
 [facility] Return Visit  SUBJECTIVE  History of Present Illness: Emily Cannon is a 89 y.o. female seen in follow-up for urgency urinary incontinence, history of recurrent UTI, and nocturia. Plan at last visit was trial of Gemtesa , continue vaginal estrogen, caffeine reduction, continue methenamine  with Vit C, and change lasix  dosing.   Reports using peasize vaginal estrogen nightly for about 2 months, started to experience vaginal burning and irritation at night managed with pulling her underwear away from her perineum.  Gemtesa  previously with relief, but cost prohibitive. Switched to Mirabegron  but also noted to be cost prohibitive. Switched back to gemtesa  despite cost due to prior relief of symptoms.  Per review of PT notes, reduced daytime frequency of 6x/day, nocturia 2x/night without leakage. Bladder emptying improved by 80% and 95% reduction of leakage Symptoms now returned to nocturia 5x/night  Past Medical History: Patient  has a past medical history of Allergic rhinitis, cause unspecified, Allergy, Anxiety state, unspecified, Blood transfusion without reported diagnosis, Cataract, Chronic kidney disease, Disorder of bone and cartilage, unspecified, Esophageal candidiasis (HCC), Foot drop, Gastric ulcer, History of CMV, History of Helicobacter pylori infection, Hypothyroid, Irritable bowel syndrome, Lumbago, Microscopic polyangiitis (HCC), Other and unspecified hyperlipidemia, Pancreatic cyst, Perforation of tympanic membrane, unspecified, 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, bilateral (HCC), and Unspecified essential hypertension.   Past Surgical History: She  has a past surgical history that includes Abdominal hysterectomy; cataract surgery (5/12 and 12/24/2012); Tonsillectomy; Colonoscopy with propofol  (N/A, 01/02/2019); Esophagogastroduodenoscopy (egd) with propofol  (N/A, 01/02/2019); biopsy (01/02/2019); polypectomy (01/02/2019); NSVD; Colonoscopy; Polypectomy; and  Upper gastrointestinal endoscopy.   Medications: She has a current medication list which includes the following prescription(s): acetaminophen , amlodipine , vitamin c , atorvastatin , tavneos, cholecalciferol, esomeprazole , estradiol , estradiol , fluconazole , furosemide , gabapentin , gemtesa , levothyroxine , metoprolol  succinate, centrum silver 50+women, silver sulfadiazine, tramadol , trospium chloride, valacyclovir , and vascepa .   Allergies: Patient is allergic to alendronate sodium, amoxicillin, cephalexin , codeine, and valsartan.   Social History: Patient  reports that she has never smoked. She has never used smokeless tobacco. She reports that she does not drink alcohol and does not use drugs.     OBJECTIVE     Physical Exam: Vitals:   12/05/23 1327  BP: 135/62  Pulse: 62   Gen: No apparent distress, A&O x 3.  Physical Exam Constitutional:      General: She is not in acute distress.    Appearance: Normal appearance.  Genitourinary:     Right Labia: No rash, tenderness, lesions, skin changes or Bartholin's cyst.    Left Labia: No tenderness, lesions, skin changes, Bartholin's cyst or rash.    No vaginal discharge or bleeding.     No vaginal prolapse present.    Mild vaginal atrophy present. Cardiovascular:     Rate and Rhythm: Normal rate.  Pulmonary:     Effort: Pulmonary effort is normal.  Neurological:     Mental Status: She is alert.  Exam conducted with a chaperone present.        ASSESSMENT AND PLAN    Emily Cannon is a 89 y.o. with:  1. Urge urinary incontinence   2. Nocturia   3. Vaginal irritation     Urge urinary incontinence Assessment & Plan: - We previously discussed the symptoms of overactive bladder (OAB), which include urinary urgency, urinary frequency, nocturia, with or without urge incontinence.  While we do not know the exact etiology of OAB, several treatment options exist. We discussed management including behavioral therapy (decreasing bladder  irritants, urge suppression strategies, timed voids,  bladder retraining), physical therapy, medication; for refractory cases posterior tibial nerve stimulation, sacral neuromodulation, and intravesical botulinum toxin injection.  For anticholinergic medications, we discussed the potential side effects of anticholinergics including dry eyes, dry mouth, constipation, cognitive impairment and urinary retention. For Beta-3 agonist medication, we discussed the potential side effect of elevated blood pressure which is more likely to occur in individuals with uncontrolled hypertension. - initial improvement with Gemtesa  (around $100/month), continues despite cost due to prior relief. Mirabegron  also cost prohibitive - reviewed possible change to Trospium, Rx printed for pt's husband per request to review cost. Discussed [facility] and possible assistance from her son due to reduced cost - reduce caffeine intake - completed pelvic floor PT and continue bladder training  - consider change in vaginal estrogen formulation if it persist after reduction to maintenance dosing 1g 2x/week due to return of symptoms at start of estrogen - consider functional causes of incontinence due to gait disturbance and cane use  Orders: -     Trospium Chloride ER; Take 1 capsule (60 mg total) by mouth daily.  Dispense: 30 capsule; Refill: 2  Nocturia Assessment & Plan: - initial 80% relief after gemtesa  and PFPT, returned to 5x/night due to vaginal irritation - trial of trospium if covered by insurance and consider changing vaginal estrogen formulation - limited fluid intake after dinner - continue elevating legs during daytime to increase venous return - switch lasix  to 2pm dosing - denies snoring or history of OSA   Vaginal irritation Assessment & Plan: - irritation started after vaginal estrogen use nightly for around 2 months, worsens at night.  - Pending nuswab to r/o infectious etiology, Rx diflucan  for  presumed vulvovaginal candidiasis due to irritation - pt to reduce vaginal estrogen to 1g twice a week - discussed possible change to another formulation of estrogen if she continues to experience irritation and exacerbation of urinary frequency - discussed proper vulvar care, warm compression, avoid pad use, cotton only underwear and barrier ointment if needed    Orders: -     Fluconazole ; Take 1 tablet (150 mg total) by mouth once for 1 dose.  Dispense: 1 tablet; Refill: 1 -     Cervicovaginal ancillary only  Time spent: I spent 21 minutes dedicated to the care of this patient on the date of this encounter to include pre-visit review of records, face-to-face time with the patient discussing vaginal irritation, nocturia, urgency urinary incontinence and post visit documentation and ordering medication/ testing.   Darlene Ehlers, MD

## 2023-12-05 NOTE — Assessment & Plan Note (Signed)
-   irritation started after vaginal estrogen use nightly for around 2 months, worsens at night.  - Pending nuswab to r/o infectious etiology, Rx diflucan  for presumed vulvovaginal candidiasis due to irritation - pt to reduce vaginal estrogen to 1g twice a week - discussed possible change to another formulation of estrogen if she continues to experience irritation and exacerbation of urinary frequency - discussed proper vulvar care, warm compression, avoid pad use, cotton only underwear and barrier ointment if needed

## 2023-12-08 ENCOUNTER — Ambulatory Visit: Payer: Medicare Other | Admitting: Internal Medicine

## 2023-12-08 LAB — CERVICOVAGINAL ANCILLARY ONLY
Bacterial Vaginitis (gardnerella): NEGATIVE
Candida Glabrata: NEGATIVE
Candida Vaginitis: NEGATIVE
Comment: NEGATIVE
Comment: NEGATIVE
Comment: NEGATIVE

## 2023-12-09 ENCOUNTER — Ambulatory Visit: Payer: Self-pay

## 2023-12-09 NOTE — Progress Notes (Signed)
 Contacted patient , she stated she is still having vaginal discomfort after taking the diflucan . Also, she is still using the vaginal estrogen cream as directed. Please advise

## 2023-12-10 ENCOUNTER — Ambulatory Visit: Payer: Self-pay | Admitting: Obstetrics

## 2023-12-10 ENCOUNTER — Other Ambulatory Visit (HOSPITAL_COMMUNITY)
Admission: RE | Admit: 2023-12-10 | Discharge: 2023-12-10 | Disposition: A | Discharge status: Home / Self Care | Source: Other Acute Inpatient Hospital | Attending: Obstetrics | Admitting: Obstetrics

## 2023-12-10 ENCOUNTER — Telehealth: Payer: Self-pay

## 2023-12-10 ENCOUNTER — Ambulatory Visit: Admitting: Obstetrics

## 2023-12-10 ENCOUNTER — Encounter: Payer: Self-pay | Admitting: Obstetrics

## 2023-12-10 VITALS — BP 162/77 | HR 88

## 2023-12-10 DIAGNOSIS — R339 Retention of urine, unspecified: Secondary | ICD-10-CM | POA: Diagnosis not present

## 2023-12-10 DIAGNOSIS — N898 Other specified noninflammatory disorders of vagina: Secondary | ICD-10-CM

## 2023-12-10 DIAGNOSIS — Z87440 Personal history of urinary (tract) infections: Secondary | ICD-10-CM

## 2023-12-10 LAB — POCT URINALYSIS DIPSTICK
Bilirubin, UA: NEGATIVE
Blood, UA: POSITIVE
Glucose, UA: NEGATIVE
Ketones, UA: NEGATIVE
Nitrite, UA: NEGATIVE
Protein, UA: POSITIVE — AB
Spec Grav, UA: 1.015 — AB (ref 1.010–1.025)
Urobilinogen, UA: 0.2 U/dL
pH, UA: 6.5 (ref 5.0–8.0)

## 2023-12-10 LAB — URINALYSIS, ROUTINE W REFLEX MICROSCOPIC
Bilirubin Urine: NEGATIVE
Glucose, UA: NEGATIVE mg/dL
Ketones, ur: NEGATIVE mg/dL
Nitrite: NEGATIVE
Protein, ur: 100 mg/dL — AB
Specific Gravity, Urine: 1.009 (ref 1.005–1.030)
WBC, UA: >50 WBC/hpf (ref 0–5)
pH: 6 (ref 5.0–8.0)

## 2023-12-10 MED ORDER — CIPROFLOXACIN HCL 500 MG PO TABS: 500.0000 mg | ORAL_TABLET | Freq: Two times a day (BID) | ORAL | 0 refills | Status: DC

## 2023-12-10 NOTE — Progress Notes (Signed)
 [facility] Return Visit  SUBJECTIVE  History of Present Illness: Emily Cannon is a 89 y.o. female seen in follow-up for suprapubic pain, dysuria after started Trospium 1 day ago. History of urgency urinary incontinence, history of recurrent UTI, and nocturia. Plan at last visit was trial of  Trospium, continue vaginal estrogen, and diflucan  for vaginal irritation.   Started Trospium yesterday and repeated dose this morning at 8am with reduction of urinary frequency. Reports increased vaginal/suprapubic burning and irritation since around lunch time. Called office regarding vomiting and pain, advised patient for urgent evaluation in the office.  Per review of PT notes, reduced daytime frequency of 6x/day, nocturia 2x/night without leakage. Bladder emptying improved by 80% and 95% reduction of leakage Symptoms now returned to nocturia 5x/night  Past Medical History: Patient  has a past medical history of Allergic rhinitis, cause unspecified, Allergy, Anxiety state, unspecified, Blood transfusion without reported diagnosis, Cataract, Chronic kidney disease, Disorder of bone and cartilage, unspecified, Esophageal candidiasis (HCC), Foot drop, Gastric ulcer, History of CMV, History of Helicobacter pylori infection, Hypothyroid, Irritable bowel syndrome, Lumbago, Microscopic polyangiitis (HCC), Other and unspecified hyperlipidemia, Pancreatic cyst, Perforation of tympanic membrane, unspecified, 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, bilateral (HCC), and Unspecified essential hypertension.   Past Surgical History: She  has a past surgical history that includes Abdominal hysterectomy; cataract surgery (5/12 and 12/24/2012); Tonsillectomy; Colonoscopy with propofol  (N/A, 01/02/2019); Esophagogastroduodenoscopy (egd) with propofol  (N/A, 01/02/2019); biopsy (01/02/2019); polypectomy (01/02/2019); NSVD; Colonoscopy; Polypectomy; and Upper gastrointestinal endoscopy.   Medications: She has a  current medication list which includes the following prescription(s): acetaminophen , amlodipine , vitamin c , atorvastatin , tavneos, cholecalciferol, ciprofloxacin , esomeprazole , estradiol , estradiol , furosemide , gabapentin , gemtesa , levothyroxine , metoprolol  succinate, centrum silver 50+women, silver sulfadiazine, tramadol , valacyclovir , and vascepa .   Allergies: Patient is allergic to alendronate sodium, amoxicillin, cephalexin , codeine, and valsartan.   Social History: Patient  reports that she has never smoked. She has never used smokeless tobacco. She reports that she does not drink alcohol and does not use drugs.     OBJECTIVE     Physical Exam: Vitals:   12/10/23 1506  BP: (!) 162/77  Pulse: 88   Physical Exam Constitutional:      General: She is in acute distress.     Appearance: Normal appearance.  Genitourinary:     Urethral meatus normal.     Right Labia: No rash, tenderness, lesions, skin changes or Bartholin's cyst.    Left Labia: No tenderness, lesions, skin changes, Bartholin's cyst or rash.    No vaginal discharge or bleeding.     No vaginal prolapse present.    Mild vaginal atrophy present.    Urethral meatus caruncle not present. Cardiovascular:     Rate and Rhythm: Normal rate.  Pulmonary:     Effort: Pulmonary effort is normal.  Neurological:     Mental Status: She is alert.  Exam conducted with a chaperone present.      Catheterization Procedure for urinary retention: After verbal consent was obtained from the patient for catheterization to assess bladder emptying and residual volume the urethra and surrounding tissues were prepped with betadine and a 14 Fr catheter was placed.  PVR was .  Urine appeared cloudy yellow. The patient tolerated the procedure well.  Foley catheter secured with a stat lock with plug provided. Patient and husband provided instruction for care and able to release from stat lock and unplug foley to drain with minimal assistance.    Lab Results  Component Value Date   COLORU  Yellow 12/10/2023   CLARITYU Sightly cloudy 12/10/2023   GLUCOSEUR Negative 12/10/2023   BILIRUBINUR Negative 12/10/2023   KETONESU Negative 12/10/2023   SPECGRAV 1.015 (A) 12/10/2023   RBCUR Positive 12/10/2023   PHUR 6.5 12/10/2023   PROTEINUR Positive (A) 12/10/2023   UROBILINOGEN 0.2 12/10/2023   LEUKOCYTESUR Small (1+) (A) 12/10/2023      ASSESSMENT AND PLAN    Emily Cannon is a 89 y.o. with:  1. Urinary retention   2. History of recurrent UTI (urinary tract infection)   3. Vaginal irritation      Urinary retention Assessment & Plan: - difficulty with urinary and catheterized for with acute pain - foley catheter placed with resolution of discomfort, stat lock applied with plug and foley bag provided.  - reviewed need for draining every 3-4 hours or sooner if she experiences discomfort in her bladder with return of burning and pain - discussed possible causes of acute urinary retention such as UTI, medication side effect - Rx cipro  for presumed UTI due to abnormal UA, pending culture - foley catheter placed and repeat retrograde filled void trial tomorrow. - reviewed management options for urinary retention includes: indwelling foley, clean intermittent catheterization, suprapubic catheter placement.  - discussed need for additional urodynamics testing - pt desires to undergo [facility] teaching due to relief of burning and pain after bladder emptying  Orders: -     Urine Microscopic; Future -     Urine Culture; Future -     POCT urinalysis dipstick -     Ciprofloxacin  HCl; Take 1 tablet (500 mg total) by mouth 2 (two) times daily for 7 days.  Dispense: 14 tablet; Refill: 0  History of recurrent UTI (urinary tract infection) Assessment & Plan: - catheterized UA + leuk/protein, pending culture - evaluated by Dr. Levern Reader (ID) and previously started methenamine , continue with Vit C use and stop orange juice due to upset  stomach - For treatment of recurrent urinary tract infections, we discussed management of recurrent UTIs including prophylaxis with a daily low dose antibiotic, transvaginal estrogen therapy, D-mannose, and cranberry supplements.  We discussed the role of diagnostic testing such as cystoscopy and upper tract imaging.   - provided cranberry extract samples  - encouraged probiotics - continue vaginal estrogen twice a week - consider changing UTI suppression if culture positive  Orders: -     Ciprofloxacin  HCl; Take 1 tablet (500 mg total) by mouth 2 (two) times daily for 7 days.  Dispense: 14 tablet; Refill: 0  Vaginal irritation Assessment & Plan: - irritation started after vaginal estrogen use nightly for around 2 months, worsens at night.  - similar sensation with urinary retention in the office, resolved after foley placement - 12/05/23 Nuswab, Rx diflucan  for presumed vulvovaginal candidiasis due to irritation - continue vaginal estrogen to 1g twice a week - discussed possible change to another formulation of estrogen if she continues to experience irritation and exacerbation of urinary frequency. Pt declines at this time - discussed proper vulvar care, warm compression, avoid pad use, cotton only underwear and barrier ointment if needed      Time spent: I spent 40 minutes dedicated to the care of this patient on the date of this encounter to include pre-visit review of records, face-to-face time with the patient discussing urinary retention, history of rUTI, vaginal irritation and post visit documentation and ordering medication/ testing.   Darlene Ehlers, MD

## 2023-12-10 NOTE — Assessment & Plan Note (Signed)
-   difficulty with urinary and catheterized for with acute pain - foley catheter placed with resolution of discomfort, stat lock applied with plug and foley bag provided.  - reviewed need for draining every 3-4 hours or sooner if she experiences discomfort in her bladder with return of burning and pain - discussed possible causes of acute urinary retention such as UTI, medication side effect - Rx cipro  for presumed UTI due to abnormal UA, pending culture - foley catheter placed and repeat retrograde filled void trial tomorrow. - reviewed management options for urinary retention includes: indwelling foley, clean intermittent catheterization, suprapubic catheter placement.  - discussed need for additional urodynamics testing - pt desires to undergo [facility] teaching due to relief of burning and pain after bladder emptying

## 2023-12-10 NOTE — Telephone Encounter (Signed)
 Patients son called on her behalf of his mother Emily Cannon. He states his mother is sick on her stomach after starting her Trospium. Today was her first dose. They believe she is having an allergic reaction. She also said she has vaginal burning after starting this medication. Patient has been added to Dr Magdalena Scholz schedule for this afternoon. Family notified to bring her in ASAP

## 2023-12-10 NOTE — Progress Notes (Signed)
 Attempted to contact patient, husband answer and asked her if she willing to try an oral estradiol . Mrs. Emily Cannon stated she woud like to stick with the cream for now and they will contact the office if she change her mind.

## 2023-12-10 NOTE — Assessment & Plan Note (Signed)
-   irritation started after vaginal estrogen use nightly for around 2 months, worsens at night.  - similar sensation with urinary retention in the office, resolved after foley placement - 12/05/23 Nuswab, Rx diflucan  for presumed vulvovaginal candidiasis due to irritation - continue vaginal estrogen to 1g twice a week - discussed possible change to another formulation of estrogen if she continues to experience irritation and exacerbation of urinary frequency. Pt declines at this time - discussed proper vulvar care, warm compression, avoid pad use, cotton only underwear and barrier ointment if needed

## 2023-12-10 NOTE — Assessment & Plan Note (Signed)
-   catheterized UA + leuk/protein, pending culture - evaluated by Dr. Levern Reader (ID) and previously started methenamine , continue with Vit C use and stop orange juice due to upset stomach - For treatment of recurrent urinary tract infections, we discussed management of recurrent UTIs including prophylaxis with a daily low dose antibiotic, transvaginal estrogen therapy, D-mannose, and cranberry supplements.  We discussed the role of diagnostic testing such as cystoscopy and upper tract imaging.   - provided cranberry extract samples  - encouraged probiotics - continue vaginal estrogen twice a week - consider changing UTI suppression if culture positive

## 2023-12-10 NOTE — Patient Instructions (Addendum)
 Continue vaginal estrogen 1g twice a week.   Stop Trospium  Unplug your foley catheter every 3-4 hours or when your bladder feels full.   Please return tomorrow for repeat void trial to assess bladder emptying.   Consider urodynamic bladder testing to assess your bladder emptying.   Please start Ciprofloxacin  500mg  twice daily for 7 days to treat presumed UTI.

## 2023-12-11 ENCOUNTER — Ambulatory Visit

## 2023-12-11 VITALS — BP 149/75 | HR 76

## 2023-12-11 DIAGNOSIS — R339 Retention of urine, unspecified: Secondary | ICD-10-CM | POA: Diagnosis not present

## 2023-12-11 DIAGNOSIS — R8290 Unspecified abnormal findings in urine: Secondary | ICD-10-CM

## 2023-12-11 LAB — URINE CULTURE: Culture: >=100000 — AB

## 2023-12-11 NOTE — Addendum Note (Signed)
 Addended byWyonia Hefty T on: 12/11/2023 05:38 PM   Modules accepted: Level of Service

## 2023-12-11 NOTE — Patient Instructions (Signed)
 Please keep scheduled follow up asa scheduled.  Call us  if you have any questions or concerns.  It was a pleasure to see you today!  Thank you for trusting me with your care!

## 2023-12-11 NOTE — Progress Notes (Signed)
 Emily Cannon is a 89 y.o. female is here for a voiding trial. 14 fr cath removed with no complications.  Instilled: 200 Voided: missed the hat, no results. PVR: 53.  In and out cath 25.   Patient and her husband were taught self-cath teaching with assistance from Dr. Aron Lard. She demonstrated on the pt using a mirror how to use a 57fr cath to drain urine from the bladder. Pt and her husband were able to demonstrate successfully using the catheter and verbalized understanding. Pt was given a self-cath bag with several catheters, lubricant, a mirror, measuring hat.

## 2023-12-11 NOTE — Assessment & Plan Note (Signed)
-   lactobacillus on urine culture with 0-5 RBC/hpf - repeat testing with catheterized sample at follow-up to minimize contamination if clinical change - pt denies hematuria, back pain, fevers, chills - consider renal ultrasound if persistent hematuria on repeat urine testing

## 2023-12-11 NOTE — Progress Notes (Addendum)
  Urogynecology Return Visit  SUBJECTIVE  History of Present Illness: Emily Cannon is a 89 y.o. female seen in follow-up for urinary retention after starting  Trospium. History of urgency urinary incontinence, history of recurrent UTI, and nocturia.   Urinary retention after 2 doses of Trospium, symptoms reported as increased vaginal/suprapubic burning and irritation resolved after foley catheter placement with output.   Per review of prior PT notes, reduced daytime frequency of 6x/day, nocturia 2x/night without leakage. Bladder emptying improved by 80% and 95% reduction of leakage Symptoms now returned to nocturia 5x/night  Past Medical History: Patient  has a past medical history of Allergic rhinitis, cause unspecified, Allergy, Anxiety state, unspecified, Blood transfusion without reported diagnosis, Cataract, Chronic kidney disease, Disorder of bone and cartilage, unspecified, Esophageal candidiasis (HCC), Foot drop, Gastric ulcer, History of CMV, History of Helicobacter pylori infection, Hypothyroid, Irritable bowel syndrome, Lumbago, Microscopic polyangiitis (HCC), Other and unspecified hyperlipidemia, Pancreatic cyst, Perforation of tympanic membrane, unspecified, 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, bilateral (HCC), and Unspecified essential hypertension.   Past Surgical History: She  has a past surgical history that includes Abdominal hysterectomy; cataract surgery (5/12 and 12/24/2012); Tonsillectomy; Colonoscopy with propofol  (N/A, 01/02/2019); Esophagogastroduodenoscopy (egd) with propofol  (N/A, 01/02/2019); biopsy (01/02/2019); polypectomy (01/02/2019); NSVD; Colonoscopy; Polypectomy; and Upper gastrointestinal endoscopy.   Medications: She has a current medication list which includes the following prescription(s): acetaminophen , amlodipine , vitamin c , atorvastatin , tavneos, cholecalciferol, ciprofloxacin , esomeprazole , estradiol , estradiol , furosemide , gabapentin ,  gemtesa , levothyroxine , metoprolol  succinate, centrum silver 50+women, silver sulfadiazine, tramadol , valacyclovir , and vascepa .   Allergies: Patient is allergic to alendronate sodium, amoxicillin, cephalexin , codeine, and valsartan.   Social History: Patient  reports that she has never smoked. She has never used smokeless tobacco. She reports that she does not drink alcohol and does not use drugs.     OBJECTIVE     Physical Exam: Vitals:   12/11/23 1211  BP: (!) 149/75  Pulse: 76   Physical Exam Constitutional:      General: She is not in acute distress.    Appearance: Normal appearance.  Genitourinary:     Urethral meatus normal.     Right Labia: No rash, tenderness, lesions, skin changes or Bartholin's cyst.    Left Labia: No tenderness, lesions, skin changes, Bartholin's cyst or rash.    No vaginal discharge or bleeding.     No vaginal prolapse present.    Mild vaginal atrophy present.    Urethral meatus caruncle not present. Cardiovascular:     Rate and Rhythm: Normal rate.  Pulmonary:     Effort: Pulmonary effort is normal.  Neurological:     Mental Status: She is alert.  Vitals reviewed. Exam conducted with a chaperone present.    CIC teaching: Reviewed anatomy, handwashing, supplies needed for CIC. After verbal consent was obtained from the patient for catheterization assisted by patient's husband. Patient's husband performed an in and out catheterization with minimal assistance.  The patient tolerated the procedure well. PVR 25mL  Lab Results  Component Value Date   COLORU Yellow 12/10/2023   CLARITYU Sightly cloudy 12/10/2023   GLUCOSEUR Negative 12/10/2023   BILIRUBINUR NEGATIVE 12/10/2023   KETONESU Negative 12/10/2023   SPECGRAV 1.015 (A) 12/10/2023   RBCUR Positive 12/10/2023   PHUR 6.5 12/10/2023   PROTEINUR 100 (A) 12/10/2023   UROBILINOGEN 0.2 12/10/2023   LEUKOCYTESUR MODERATE (A) 12/10/2023   RBC 0-5/HPF Culture with >100K lactobaccus     ASSESSMENT AND PLAN    Ms. Bibb is a 89  y.o. with:  1. Urinary retention   2. Abnormal urinalysis      Urinary retention Assessment & Plan: - pain and difficulty with urination after 2 doses of Trospium, catheterized for with foley placed - passed void trial with 25mL, filled to  - discussed possible causes of acute urinary retention such as UTI, medication side effect - Rx cipro  for presumed UTI, stopped cipro  due to lactobacillus on urine culture - reviewed management options for urinary retention includes: indwelling foley, clean intermittent catheterization, suprapubic catheter placement.  - discussed need for additional urodynamics testing, office to schedule - patient with difficulty perform CIC, underwent CIC teaching with pt's husband to start PRN burning and pain after bladder emptying - provided CIC supplies and diary to record volumes if needed   Abnormal urinalysis Assessment & Plan: - lactobacillus on urine culture with 0-5 RBC/hpf - repeat testing with catheterized sample at follow-up to minimize contamination if clinical change - pt denies hematuria, back pain, fevers, chills - consider renal ultrasound if persistent hematuria on repeat urine testing      Darlene Ehlers, MD

## 2023-12-11 NOTE — Assessment & Plan Note (Signed)
-   pain and difficulty with urination after 2 doses of Trospium, catheterized for with foley placed - passed void trial with 25mL, filled to  - discussed possible causes of acute urinary retention such as UTI, medication side effect - Rx cipro  for presumed UTI, stopped cipro  due to lactobacillus on urine culture - reviewed management options for urinary retention includes: indwelling foley, clean intermittent catheterization, suprapubic catheter placement.  - discussed need for additional urodynamics testing, office to schedule - patient with difficulty perform CIC, underwent CIC teaching with pt's husband to start PRN burning and pain after bladder emptying - provided CIC supplies and diary to record volumes if needed

## 2023-12-15 ENCOUNTER — Encounter: Payer: Self-pay | Admitting: Family Medicine

## 2023-12-15 ENCOUNTER — Ambulatory Visit (INDEPENDENT_AMBULATORY_CARE_PROVIDER_SITE_OTHER): Admitting: Family Medicine

## 2023-12-15 VITALS — BP 138/66 | HR 62 | Temp 98.1°F | Wt 139.6 lb

## 2023-12-15 DIAGNOSIS — I10 Essential (primary) hypertension: Secondary | ICD-10-CM | POA: Diagnosis not present

## 2023-12-15 DIAGNOSIS — E039 Hypothyroidism, unspecified: Secondary | ICD-10-CM

## 2023-12-15 DIAGNOSIS — M317 Microscopic polyangiitis: Secondary | ICD-10-CM | POA: Diagnosis not present

## 2023-12-15 NOTE — Progress Notes (Signed)
 Established Patient Office Visit  Subjective   Patient ID: Emily Cannon, female    DOB: [birth year]  Age: 89 y.o. MRN: [id number]  Chief Complaint  Patient presents with   Medical Management of Chronic Issues    HPI   Emily Cannon is seen today for medical follow-up.  He has history of hypertension, microscopic polyangiitis, venous insufficiency, bilateral foot drop, IBS, hypothyroidism, chronic neuropathy.  She continues to ambulate with a walker.  She has bilateral AFO braces.  She has been walking 15 minutes segments several times per day.  She had recent episode of acute urinary retention and saw urogynecologist.  Was taken off trospium .  She apparently had Foley catheter for a while followed by instructions for In-and-Out cath but this was not necessary.  Does have some persistent urinary urgency but no recurrent urinary retention.  She does have hypothyroidism and is on replacement.  Needs follow-up TSH.  She gets regular CBC and chemistries through her rheumatologist.  Her blood pressure is treated with Toprol  XL and amlodipine  5 mg daily.  Compliant with medications.  Past Medical History:  Diagnosis Date   Allergic rhinitis, cause unspecified    Allergy    Anxiety state, unspecified    Blood transfusion without reported diagnosis    2020   Cataract    removed both eyes    Chronic kidney disease    Disorder of bone and cartilage, unspecified    Esophageal candidiasis (HCC)    Foot drop    Gastric ulcer    History of CMV    History of Helicobacter pylori infection    Hypothyroid    Irritable bowel syndrome    Lumbago    Microscopic polyangiitis (HCC)    Other and unspecified hyperlipidemia    Pancreatic cyst    Perforation of tympanic membrane, unspecified    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, bilateral (HCC)    Unspecified essential hypertension    Past Surgical History:  Procedure Laterality Date   ABDOMINAL HYSTERECTOMY     BIOPSY  01/02/2019    Procedure: BIOPSY;  Surgeon: Ace Holder, MD;  Location: [facility] ENDOSCOPY;  Service: Gastroenterology;;   cataract surgery  5/12 and 12/24/2012   both eyes   COLONOSCOPY     COLONOSCOPY WITH PROPOFOL  N/A 01/02/2019   Procedure: COLONOSCOPY WITH PROPOFOL ;  Surgeon: Ace Holder, MD;  Location: [facility] ENDOSCOPY;  Service: Gastroenterology;  Laterality: N/A;   ESOPHAGOGASTRODUODENOSCOPY (EGD) WITH PROPOFOL  N/A 01/02/2019   Procedure: ESOPHAGOGASTRODUODENOSCOPY (EGD) WITH PROPOFOL ;  Surgeon: Ace Holder, MD;  Location: [facility] ENDOSCOPY;  Service: Gastroenterology;  Laterality: N/A;   NSVD     x1   POLYPECTOMY  01/02/2019   Procedure: POLYPECTOMY;  Surgeon: Ace Holder, MD;  Location: [facility] ENDOSCOPY;  Service: Gastroenterology;;   POLYPECTOMY     TONSILLECTOMY     UPPER GASTROINTESTINAL ENDOSCOPY      reports that she has never smoked. She has never used smokeless tobacco. She reports that she does not drink alcohol and does not use drugs. family history includes COPD in an other family member; Lung cancer (age of onset: 16) in her mother; Lung disease (age of onset: 29) in her father. Allergies  Allergen Reactions   Alendronate Sodium Shortness Of Breath   Amoxicillin Diarrhea    Severe diarrhea   Cephalexin  Diarrhea, Nausea Only and Other (See Comments)    Abdominal cramps   Codeine Nausea Only   Valsartan Swelling    Gums/throat swell  Review of Systems  Constitutional:  Negative for malaise/fatigue.  Eyes:  Negative for blurred vision.  Respiratory:  Negative for shortness of breath.   Cardiovascular:  Negative for chest pain.  Neurological:  Negative for dizziness, weakness and headaches.      Objective:     BP 138/66 (BP Location: Left Arm, Patient Position: Sitting, Cuff Size: Normal)   Pulse 62   Temp 98.1 F (36.7 C) (Oral)   Wt 139 lb 9.6 oz (63.3 kg)   SpO2 95%   BMI 26.38 kg/m  BP Readings from Last 3 Encounters:  12/15/23 138/66  12/11/23  (!) 149/75  12/10/23 (!) 162/77   Wt Readings from Last 3 Encounters:  12/15/23 139 lb 9.6 oz (63.3 kg)  09/03/23 138 lb (62.6 kg)  07/16/23 136 lb (61.7 kg)      Physical Exam Vitals reviewed.  Constitutional:      General: She is not in acute distress.    Appearance: She is well-developed. She is not ill-appearing.  Eyes:     Pupils: Pupils are equal, round, and reactive to light.  Neck:     Thyroid : No thyromegaly.     Vascular: No JVD.  Cardiovascular:     Rate and Rhythm: Normal rate and regular rhythm.     Heart sounds:     No gallop.  Pulmonary:     Effort: Pulmonary effort is normal. No respiratory distress.     Breath sounds: Normal breath sounds. No wheezing or rales.  Musculoskeletal:     Cervical back: Neck supple.     Comments: Only trace pitting edema lower legs bilaterally.  She has AFO braces on bilaterally.  Neurological:     Mental Status: She is alert.      No results found for any visits on 12/15/23.    The ASCVD Risk score (Arnett DK, et al., 2019) failed to calculate for the following reasons:   The 2019 ASCVD risk score is only valid for ages 43 to 37    Assessment & Plan:   #1 hypothyroidism.  Patient on levothyroxine .  Needs follow-up TSH and this to be drawn today.  #2 hypertension.  Patient on amlodipine  and low-dose Toprol -XL.  Blood pressure stable.  Continue low-sodium diet.  Continue periodic monitoring.  #3 microscopic polyangiitis-overall stable.  Followed by rheumatology  No follow-ups on file.    Glean Lamy, MD

## 2023-12-16 ENCOUNTER — Ambulatory Visit: Payer: Self-pay | Admitting: Family Medicine

## 2023-12-16 LAB — TSH: TSH: 2.44 u[IU]/mL (ref 0.35–5.50)

## 2023-12-17 ENCOUNTER — Ambulatory Visit (INDEPENDENT_AMBULATORY_CARE_PROVIDER_SITE_OTHER): Admitting: Internal Medicine

## 2023-12-17 ENCOUNTER — Other Ambulatory Visit: Payer: Self-pay

## 2023-12-17 ENCOUNTER — Encounter: Payer: Self-pay | Admitting: Internal Medicine

## 2023-12-17 VITALS — BP 148/87 | HR 74 | Temp 97.7°F | Wt 137.0 lb

## 2023-12-17 DIAGNOSIS — R300 Dysuria: Secondary | ICD-10-CM | POA: Diagnosis present

## 2023-12-17 DIAGNOSIS — R399 Unspecified symptoms and signs involving the genitourinary system: Secondary | ICD-10-CM | POA: Diagnosis not present

## 2023-12-17 DIAGNOSIS — N3281 Overactive bladder: Secondary | ICD-10-CM

## 2023-12-17 NOTE — Progress Notes (Unsigned)
 Patient ID: Emily Cannon, female   DOB: [birth year], 89 y.o.   MRN: [id number]  HPI Emily Cannon is a 89yo F with post menopausal recurrent uti, urinary syndrome. She states that lately she is noticing more burning urination but mostly at night where she is incontinent some discomfort since urine affecting labial skin. She states that barrier cream helps. Last week she states her symptoms were not as bad but did have urine specimen checked and no signs of infection. She has temporarily stopped using estradiol  cream due to causing rawness.  Outpatient Encounter Medications as of 12/17/2023  Medication Sig   acetaminophen  (TYLENOL ) 500 MG tablet Take 500 mg by mouth every 6 (six) hours as needed.   amLODipine  (NORVASC ) 5 MG tablet Take 5 mg by mouth daily.   Ascorbic Acid (VITAMIN C ) 100 MG tablet Take 1 tablet (100 mg total) by mouth daily.   atorvastatin  (LIPITOR) 20 MG tablet TAKE 1 TABLET BY MOUTH DAILY   Avacopan (TAVNEOS) 10 MG CAPS Take 2 capsules by mouth twice daily with meals.   Cholecalciferol 25 MCG (1000 UT) tablet Take 1 capsule by mouth daily.   esomeprazole  (NEXIUM ) 40 MG capsule TAKE 1 CAPSULE BY MOUTH DAILY   furosemide  (LASIX ) 40 MG tablet Take 0.5 tablets (20 mg total) by mouth 3 (three) times a week. Monday, Wed, Friday (Patient taking differently: Take 40 mg by mouth every Monday, Wednesday, and Friday.)   gabapentin  (NEURONTIN ) 100 MG capsule Take 2 capsules by mouth at night for neuropathy pain (Patient taking differently: 100 mg 2 (two) times daily. Take 2 capsules by mouth at night for neuropathy pain)   levothyroxine  (SYNTHROID ) 50 MCG tablet TAKE ONE TABLET BY MOUTH EVERY MORNING BEFORE BREAKFAST   metoprolol  succinate (TOPROL -XL) 50 MG 24 hr tablet TAKE 1 TABLET BY MOUTH 2 TIMES A DAY WITH OR IMMEDIATELY FOLLOWING A MEAL   Multiple Vitamins-Minerals (CENTRUM SILVER 50+WOMEN) TABS Take 1 tablet by mouth daily with breakfast.   traMADol  (ULTRAM ) 50 MG tablet TAKE ONE  TABLET BY MOUTH EVERY 6 HOURS AS NEEDED FOR UP TO 5 DAYS   valACYclovir  (VALTREX ) 500 MG tablet Take 500 mg by mouth 2 (two) times daily.   VASCEPA  1 g capsule TAKE 2 CAPSULES BY MOUTH TWICE A DAY   No facility-administered encounter medications on file as of 12/17/2023.     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) 09/25/2020   Peripheral neuropathy 05/08/2020   Weakness 03/28/2020   Gait abnormality 03/28/2020   Paresthesia 03/28/2020   Left foot drop 03/23/2020   Heme positive stool    Benign neoplasm of colon    Acute blood loss anemia 12/30/2018   Symptomatic anemia 12/15/2018   AKI (acute kidney injury) (HCC) 12/15/2018   Hypomagnesemia 12/15/2018   Steroid-induced hyperglycemia 12/15/2018   Microscopic polyangiitis (HCC) 11/13/2018   Positive P-ANCA titer 10/14/2018   Viral URI 05/12/2018   Syncope 06/05/2016   Venous (peripheral) insufficiency 11/23/2012   Dermatitis 11/10/2012   Hypothyroidism 05/22/2011   Perforation of tympanic membrane 08/08/2007   Osteopenia 07/27/2007   Hyperlipidemia 05/11/2007   Anxiety state 05/11/2007   Essential hypertension 05/11/2007   Allergic rhinitis 05/11/2007   Irritable  bowel syndrome 05/11/2007   LOW BACK PAIN 05/11/2007   Acquired absence of genital organ 05/11/2007     Health Maintenance Due  Topic Date Due   COVID-19 Vaccine (6 - Mixed Product risk 2024-25 season) 11/07/2023     Review of Systems 12 point ros is negative Physical Exam   BP (!) 148/87   Pulse 74   Temp 97.7 F (36.5 C) (Oral)   Wt 137 lb (62.1 kg)   SpO2 95%   BMI 25.89 kg/m    Physical Exam  Constitutional:  oriented to  person, place, and time. appears well-developed and well-nourished. No distress.  HENT: Emily Cannon/AT, PERRLA, no scleral icterus Mouth/Throat: Oropharynx is clear and moist. No oropharyngeal exudate.  Cardiovascular: Normal rate, regular rhythm and normal heart sounds. Exam reveals no gallop and no friction rub.  No murmur heard.  Pulmonary/Chest: Effort normal and breath sounds normal. No respiratory distress.  has no wheezes.  Neck = supple, no nuchal rigidity Abdominal: Soft. Bowel sounds are normal.  exhibits no distension. There is no tenderness.  Lymphadenopathy: no cervical adenopathy. No axillary adenopathy Neurological: alert and oriented to person, place, and time.  Skin: Skin is warm and dry. No rash noted. No erythema.  Psychiatric: a normal mood and affect.  behavior is normal.    CBC Lab Results  Component Value Date   WBC 5.6 04/08/2023   RBC 4.53 04/08/2023   HGB 14.3 04/08/2023   HCT 40.8 04/08/2023   PLT 169 04/08/2023   MCV 90.1 04/08/2023   MCH 31.6 04/08/2023   MCHC 35.0 04/08/2023   RDW 13.6 04/08/2023   LYMPHSABS 2,257 04/08/2023   MONOABS 0.8 08/30/2022   EOSABS 190 04/08/2023    BMET Lab Results  Component Value Date   NA 140 05/13/2023   K 4.5 05/13/2023   CL 104 05/13/2023   CO2 26 05/13/2023   GLUCOSE 96 05/13/2023   BUN 16 05/13/2023   CREATININE 1.16 (H) 05/13/2023   CALCIUM  9.4 05/13/2023   GFRNONAA 20 (L) 01/03/2019   GFRAA 23 (L) 01/03/2019      Assessment and Plan  Dysuria = difficult to tell if this is early uti vs. Discomfort associated atrophy of labial region, causing discomfort if exposed to wet undergarments  - will check ua and urine culture  Addendum = urine culture did not isolate significant count of bacteria. Will not recommend treating at this time  Continue with uti prevention efforts as discussed  I have personally spent 35 minutes involved in face-to-face and non-face-to-face activities for this patient on the day of  the visit. Professional time spent includes the following activities: Preparing to see the patient (review of tests), , Performing a medically appropriate examination and/or evaluation , Ordering tests, Documenting clinical information in the EMR, Independently interpreting results (not separately reported), Communicating results to the patient Counseling and educating the patient on uti symptoms

## 2023-12-18 LAB — URINALYSIS, ROUTINE W REFLEX MICROSCOPIC
Bacteria, UA: NONE SEEN /HPF
Bilirubin Urine: NEGATIVE
Glucose, UA: NEGATIVE
Hgb urine dipstick: NEGATIVE
Hyaline Cast: NONE SEEN /LPF
Ketones, ur: NEGATIVE
Nitrite: NEGATIVE
Protein, ur: NEGATIVE
RBC / HPF: NONE SEEN /HPF (ref 0–2)
Specific Gravity, Urine: 1.007 (ref 1.001–1.035)
pH: <=5 — AB (ref 5.0–8.0)

## 2023-12-18 LAB — MICROSCOPIC MESSAGE

## 2023-12-18 LAB — URINE CULTURE
MICRO NUMBER:: 16485463
SPECIMEN QUALITY:: ADEQUATE

## 2023-12-24 ENCOUNTER — Ambulatory Visit (INDEPENDENT_AMBULATORY_CARE_PROVIDER_SITE_OTHER): Admitting: Podiatry

## 2023-12-24 ENCOUNTER — Encounter: Payer: Self-pay | Admitting: Podiatry

## 2023-12-24 DIAGNOSIS — M79674 Pain in right toe(s): Secondary | ICD-10-CM | POA: Diagnosis not present

## 2023-12-24 DIAGNOSIS — B351 Tinea unguium: Secondary | ICD-10-CM | POA: Diagnosis not present

## 2023-12-24 DIAGNOSIS — M79675 Pain in left toe(s): Secondary | ICD-10-CM | POA: Diagnosis not present

## 2023-12-24 NOTE — Progress Notes (Signed)
 Subjective:   Patient ID: Emily Cannon, female   DOB: 89 y.o.   MRN: [id number]   HPI Patient presents with elongated nailbeds 1-5 both feet that is impossible for her or husband to cut and they become irritated and painful   ROS      Objective:  Physical Exam  Neurovascular status intact with nailbeds that are thick yellow and get brittle and painful in the size     Assessment:  Mycotic nail infection with pain 1-5 both feet     Plan:  Debride nailbeds 1-5 both feet iatrogenic bleeding reappoint routine care

## 2023-12-31 ENCOUNTER — Other Ambulatory Visit: Payer: Self-pay | Admitting: Family Medicine

## 2023-12-31 DIAGNOSIS — I10 Essential (primary) hypertension: Secondary | ICD-10-CM

## 2024-01-01 ENCOUNTER — Telehealth: Payer: Self-pay | Admitting: Nurse Practitioner

## 2024-01-01 NOTE — Telephone Encounter (Signed)
 Inbound call from patient's Husband requesting a refill for esomeprazole  sent to [facility]. States pharmacy has sent over requests but has not received anything. Please advise, thank you.

## 2024-01-02 MED ORDER — ESOMEPRAZOLE MAGNESIUM 40 MG PO CPDR: 40.0000 mg | DELAYED_RELEASE_CAPSULE | Freq: Every day | ORAL | 5 refills | Status: DC

## 2024-01-02 NOTE — Telephone Encounter (Signed)
 Prescription sent to patient's pharmacy.

## 2024-01-05 ENCOUNTER — Telehealth: Payer: Self-pay

## 2024-01-05 NOTE — Telephone Encounter (Signed)
 I spoke with the patient's husband and informed him of the message below. Patient's husband stated he would call back to schedule appt.

## 2024-01-05 NOTE — Telephone Encounter (Signed)
 I spoke with the patient's husband and he inquired if Journavx can be sent in for his wife?

## 2024-01-07 ENCOUNTER — Other Ambulatory Visit: Payer: Self-pay | Admitting: Family Medicine

## 2024-01-21 ENCOUNTER — Encounter: Admitting: Obstetrics and Gynecology

## 2024-01-24 ENCOUNTER — Emergency Department (HOSPITAL_BASED_OUTPATIENT_CLINIC_OR_DEPARTMENT_OTHER)
Admission: EM | Admit: 2024-01-24 | Discharge: 2024-01-24 | Disposition: A | Discharge status: Home / Self Care | Attending: Emergency Medicine | Admitting: Emergency Medicine

## 2024-01-24 ENCOUNTER — Other Ambulatory Visit: Payer: Self-pay

## 2024-01-24 ENCOUNTER — Encounter (HOSPITAL_BASED_OUTPATIENT_CLINIC_OR_DEPARTMENT_OTHER): Payer: Self-pay

## 2024-01-24 DIAGNOSIS — L03211 Cellulitis of face: Secondary | ICD-10-CM | POA: Insufficient documentation

## 2024-01-24 DIAGNOSIS — H00015 Hordeolum externum left lower eyelid: Secondary | ICD-10-CM | POA: Diagnosis not present

## 2024-01-24 DIAGNOSIS — Z79899 Other long term (current) drug therapy: Secondary | ICD-10-CM | POA: Diagnosis not present

## 2024-01-24 DIAGNOSIS — I10 Essential (primary) hypertension: Secondary | ICD-10-CM | POA: Diagnosis not present

## 2024-01-24 DIAGNOSIS — H5712 Ocular pain, left eye: Secondary | ICD-10-CM | POA: Diagnosis present

## 2024-01-24 MED ORDER — CLINDAMYCIN HCL 150 MG PO CAPS
300.0000 mg | ORAL_CAPSULE | Freq: Once | ORAL | Status: AC
  Administered 2024-01-24: 300 mg via ORAL
  Filled 2024-01-24: qty 2

## 2024-01-24 MED ORDER — CLINDAMYCIN HCL 300 MG PO CAPS
300.0000 mg | ORAL_CAPSULE | Freq: Four times a day (QID) | ORAL | 0 refills | Status: AC
Start: 2024-01-24 — End: ?

## 2024-01-24 NOTE — ED Triage Notes (Signed)
 Pt complaining of her left eye being red and the skin around it being red and swollen.

## 2024-01-24 NOTE — ED Provider Notes (Signed)
 [place] EMERGENCY DEPARTMENT AT [facility] Provider Note   CSN: [id number] Arrival date & time: 01/24/24  1651     Patient presents with: Eye Pain   Emily Cannon is a 89 y.o. female.   Patient is an 89 year old female with history of Wegener's granulomatosis, hypertension, hyperlipidemia.  Patient presenting today with complaints of left lower eyelid pain, swelling, and redness.  This has been ongoing for several days.  She was advised that she may have a stye and has attempted using warm compresses with little relief.  She now has redness extending into her cheek.  She denies any visual disturbances.  No eye drainage.       Prior to Admission medications   Medication Sig Start Date End Date Taking? Authorizing Provider  acetaminophen  (TYLENOL ) 500 MG tablet Take 500 mg by mouth every 6 (six) hours as needed.    [provider]  amLODipine  (NORVASC ) 5 MG tablet Take 5 mg by mouth daily.    [provider]  Ascorbic Acid (VITAMIN C ) 100 MG tablet Take 1 tablet (100 mg total) by mouth daily. 09/05/23   Guadlupe Lianne DASEN, MD  atorvastatin  (LIPITOR) 20 MG tablet TAKE 1 TABLET BY MOUTH DAILY 01/08/24   Burchette, Wolm ORN, MD  Avacopan (TAVNEOS) 10 MG CAPS Take 2 capsules by mouth twice daily with meals. 09/27/20   [provider]  Cholecalciferol 25 MCG (1000 UT) tablet Take 1 capsule by mouth daily.    [provider]  esomeprazole  (NEXIUM ) 40 MG capsule Take 1 capsule (40 mg total) by mouth daily. 01/02/24   Kennedy-Smith, Colleen M, NP  furosemide  (LASIX ) 40 MG tablet Take 0.5 tablets (20 mg total) by mouth 3 (three) times a week. Monday, Wed, Friday Patient taking differently: Take 40 mg by mouth every Monday, Wednesday, and Friday. 12/18/18   Samtani, Jai-Gurmukh, MD  gabapentin  (NEURONTIN ) 100 MG capsule Take 2 capsules by mouth at night for neuropathy pain Patient taking differently: 100 mg 2 (two) times daily. Take 2 capsules by mouth at night  for neuropathy pain 04/05/20   Micheal Wolm ORN, MD  levothyroxine  (SYNTHROID ) 50 MCG tablet TAKE ONE TABLET BY MOUTH EVERY MORNING BEFORE BREAKFAST 01/01/24   Burchette, Wolm ORN, MD  metoprolol  succinate (TOPROL -XL) 50 MG 24 hr tablet TAKE 1 TABLET BY MOUTH 2 TIMES A DAY WITH OR IMMEDIATELY FOLLOWING A MEAL 01/01/24   Burchette, Wolm ORN, MD  Multiple Vitamins-Minerals (CENTRUM SILVER 50+WOMEN) TABS Take 1 tablet by mouth daily with breakfast.    [provider]  traMADol  (ULTRAM ) 50 MG tablet TAKE ONE TABLET BY MOUTH EVERY 6 HOURS AS NEEDED FOR UP TO 5 DAYS 02/13/20   Burchette, Wolm ORN, MD  valACYclovir  (VALTREX ) 500 MG tablet Take 500 mg by mouth 2 (two) times daily.    [provider]  VASCEPA  1 g capsule TAKE 2 CAPSULES BY MOUTH TWICE A DAY 11/26/23   Burchette, Wolm ORN, MD    Allergies: Alendronate sodium, Amoxicillin, Cephalexin , Codeine, and Valsartan    Review of Systems  All other systems reviewed and are negative.   Updated Vital Signs BP (!) 199/80 (BP Location: Right Arm)   Pulse 69   Temp 98.5 F (36.9 C) (Oral)   Resp 18   Ht 5' 1 (1.549 m)   Wt 61.7 kg   SpO2 94%   BMI 25.70 kg/m   Physical Exam Vitals and nursing note reviewed.  Constitutional:      Appearance: Normal appearance.  Eyes:     Extraocular Movements: Extraocular movements intact.     Pupils: Pupils are equal, round, and reactive to light.     Comments: The left eye has a stye noted to the lower lid on the medial aspect.  There is erythema extending into the soft tissues of the cheek.  Pulmonary:     Effort: Pulmonary effort is normal.   Neurological:     Mental Status: She is alert and oriented to person, place, and time.     (all labs ordered are listed, but only abnormal results are displayed) Labs Reviewed - No data to display  EKG: None  Radiology: No results found.   Procedures   Medications Ordered in the ED  clindamycin (CLEOCIN) capsule 300 mg (has no  administration in time range)                                    Medical Decision Making Risk Prescription drug management.   Patient presenting here with complaints of a stye with cellulitis extending into the cheek.  Patient will be treated with clindamycin and continued warm compresses.     Final diagnoses:  None    ED Discharge Orders     None          Geroldine Berg, MD 01/24/24 2328

## 2024-01-24 NOTE — Discharge Instructions (Signed)
 Begin taking clindamycin as prescribed.  Apply warm compresses as frequently as possible for the next several days.

## 2024-01-26 ENCOUNTER — Other Ambulatory Visit: Payer: Self-pay

## 2024-01-26 ENCOUNTER — Telehealth: Payer: Self-pay

## 2024-01-26 NOTE — Transitions of Care (Post Inpatient/ED Visit) (Signed)
   01/26/2024  Name: Emily Cannon MRN: [id number] DOB: 1933/06/19  Today's TOC FU Call Status: Today's TOC FU Call Status:: Successful TOC FU Call Completed TOC FU Call Complete Date: 01/26/24 Patient's Name and Date of Birth confirmed.  Transition Care Management Follow-up Telephone Call Date of Discharge: 01/24/24 Discharge Facility: [facility]) Type of Discharge: Emergency Department How have you been since you were released from the hospital?: Better Any questions or concerns?: No  Items Reviewed:    Medications Reviewed Today: Medications Reviewed Today   Medications were not reviewed in this encounter     Home Care and Equipment/Supplies:    Functional Questionnaire:    Follow up appointments reviewed: PCP Follow-up appointment confirmed?: No (spoke to pt's spouse.He states doesn't feel the need for a ED follow up.)    SIGNATURE: Zayvion Stailey, CMA

## 2024-02-25 ENCOUNTER — Encounter: Payer: Self-pay | Admitting: Podiatry

## 2024-02-25 ENCOUNTER — Ambulatory Visit (INDEPENDENT_AMBULATORY_CARE_PROVIDER_SITE_OTHER): Admitting: Podiatry

## 2024-02-25 DIAGNOSIS — M79675 Pain in left toe(s): Secondary | ICD-10-CM | POA: Diagnosis not present

## 2024-02-25 DIAGNOSIS — M79674 Pain in right toe(s): Secondary | ICD-10-CM

## 2024-02-25 DIAGNOSIS — B351 Tinea unguium: Secondary | ICD-10-CM | POA: Diagnosis not present

## 2024-02-26 NOTE — Progress Notes (Signed)
 Subjective:   Patient ID: Emily Cannon, female   DOB: 89 y.o.   MRN: [id number]   HPI Patient presents with elongated painful nailbeds 1-5 both feet that are impossible for them to take care of   ROS      Objective:  Physical Exam  Neurovascular status intact with thick incurvation nailbeds 1-5 both feet that are irritated in the corners for her     Assessment:  Mycotic nail infection 1-5 both feet with irritation     Plan:  Debride nailbeds 1-5 both feet no iatrogenic bleeding reappoint routine care

## 2024-03-17 ENCOUNTER — Ambulatory Visit: Admitting: Obstetrics

## 2024-04-02 ENCOUNTER — Ambulatory Visit: Admitting: Obstetrics

## 2024-04-28 ENCOUNTER — Ambulatory Visit: Admitting: Podiatry

## 2024-04-28 ENCOUNTER — Encounter: Payer: Self-pay | Admitting: Podiatry

## 2024-04-28 DIAGNOSIS — M79674 Pain in right toe(s): Secondary | ICD-10-CM

## 2024-04-28 DIAGNOSIS — M79675 Pain in left toe(s): Secondary | ICD-10-CM | POA: Diagnosis not present

## 2024-04-28 DIAGNOSIS — B351 Tinea unguium: Secondary | ICD-10-CM | POA: Diagnosis not present

## 2024-04-29 NOTE — Progress Notes (Signed)
 Subjective:   Patient ID: Emily Cannon, female   DOB: 89 y.o.   MRN: [id number]   HPI Patient presents with the elongated nailbeds 1-5 both feet that are thick and painful and she cannot cut due to poor health   ROS      Objective:  Physical Exam  Neurovascular status unchanged thick yellow brittle nailbeds 1-5 both feet painful when pressed     Assessment:  Mycotic nail infection with pain 1-5 both feet     Plan:  Debridement of painful nailbeds 1-5 both feet no iatrogenic bleeding reappoint routine care

## 2024-05-03 ENCOUNTER — Other Ambulatory Visit (HOSPITAL_BASED_OUTPATIENT_CLINIC_OR_DEPARTMENT_OTHER): Payer: Self-pay

## 2024-05-03 MED ORDER — COMIRNATY 30 MCG/0.3ML IM SUSY
0.3000 mL | PREFILLED_SYRINGE | Freq: Once | INTRAMUSCULAR | 0 refills | Status: AC
  Filled 2024-05-03: qty 0.3, 1d supply, fill #0

## 2024-05-03 MED ORDER — FLUZONE HIGH-DOSE 0.5 ML IM SUSY
0.5000 mL | PREFILLED_SYRINGE | Freq: Once | INTRAMUSCULAR | 0 refills | Status: AC
  Filled 2024-05-03: qty 0.5, 1d supply, fill #0

## 2024-05-20 ENCOUNTER — Other Ambulatory Visit: Payer: Self-pay | Admitting: Family Medicine

## 2024-06-25 ENCOUNTER — Other Ambulatory Visit: Payer: Self-pay | Admitting: Nurse Practitioner

## 2024-06-28 ENCOUNTER — Other Ambulatory Visit: Payer: Self-pay | Admitting: Family Medicine

## 2024-06-28 DIAGNOSIS — I10 Essential (primary) hypertension: Secondary | ICD-10-CM

## 2024-06-30 ENCOUNTER — Ambulatory Visit: Admitting: Podiatry

## 2024-06-30 ENCOUNTER — Encounter: Payer: Self-pay | Admitting: Podiatry

## 2024-06-30 VITALS — Ht 61.0 in | Wt 136.0 lb

## 2024-06-30 DIAGNOSIS — M79675 Pain in left toe(s): Secondary | ICD-10-CM

## 2024-06-30 DIAGNOSIS — B351 Tinea unguium: Secondary | ICD-10-CM | POA: Diagnosis not present

## 2024-06-30 DIAGNOSIS — M79674 Pain in right toe(s): Secondary | ICD-10-CM | POA: Diagnosis not present

## 2024-07-03 ENCOUNTER — Other Ambulatory Visit: Payer: Self-pay | Admitting: Family Medicine

## 2024-07-03 NOTE — Progress Notes (Signed)
 Subjective:   Patient ID: Emily Cannon, female   DOB: 89 y.o.   MRN: [id number]   HPI Patient presents with elongated nailbeds 1-5 both feet that are painful and impossible for her to cut   ROS      Objective:  Physical Exam  Neurovascular status intact thick yellow brittle nailbeds 1-5 both feet painful     Assessment:  Mycotic nail infection with pain 1-5 both feet     Plan:  H&P reviewed debridement nailbeds 1-5 both feet no iatrogenic bleeding reappoint routine care

## 2024-07-14 ENCOUNTER — Ambulatory Visit

## 2024-07-14 VITALS — Ht 61.0 in | Wt 136.0 lb

## 2024-07-14 DIAGNOSIS — Z0000 Encounter for general adult medical examination without abnormal findings: Secondary | ICD-10-CM | POA: Diagnosis not present

## 2024-07-14 NOTE — Patient Instructions (Addendum)
 Emily Cannon,  Thank you for taking the time for your Medicare Wellness Visit. I appreciate your continued commitment to your health goals. Please review the care plan we discussed, and feel free to reach out if I can assist you further.  Please note that Annual Wellness Visits do not include a physical exam. Some assessments may be limited, especially if the visit was conducted virtually. If needed, we may recommend an in-person follow-up with your provider.  Ongoing Care Seeing your primary care provider every 3 to 6 months helps us  monitor your health and provide consistent, personalized care.    Referrals If a referral was made during today's visit and you haven't received any updates within two weeks, please contact the referred provider directly to check on the status.  Recommended Screenings:  Health Maintenance  Topic Date Due   COVID-19 Vaccine (7 - Mixed Product risk 2025-26 season) 11/01/2024   Medicare Annual Wellness Visit  07/14/2025   DTaP/Tdap/Td vaccine (3 - Td or Tdap) 01/22/2032   Pneumococcal Vaccine for age over 81  Completed   Flu Shot  Completed   Osteoporosis screening with Bone Density Scan  Completed   Zoster (Shingles) Vaccine  Completed   Meningitis B Vaccine  Aged Out   Opioid Pain Medicine Management Opioids are powerful medicines that are used to treat moderate to severe pain. When used for short periods of time, they can help you to: Sleep better. Do better in physical or occupational therapy. Feel better in the first few days after an injury. Recover from surgery. Opioids should be taken with the supervision of a trained health care provider. They should be taken for the shortest period of time possible. This is because opioids can be addictive, and the longer you take opioids, the greater your risk of addiction. This addiction can also be called opioid use disorder. What are the risks? Using opioid pain medicines for longer than 3 days increases  your risk of side effects. Side effects include: Constipation. Nausea and vomiting. Breathing difficulties (respiratory depression). Drowsiness. Confusion. Opioid use disorder. Itching. Taking opioid pain medicine for a long period of time can affect your ability to do daily tasks. It also puts you at risk for: Motor vehicle crashes. Depression. Suicide. Heart attack. Overdose, which can be life-threatening. What is a pain treatment plan? A pain treatment plan is an agreement between you and your health care provider. Pain is unique to each person, and treatments vary depending on your condition. To manage your pain, you and your health care provider need to work together. To help you do this: Discuss the goals of your treatment, including how much pain you might expect to have and how you will manage the pain. Review the risks and benefits of taking opioid medicines. Remember that a good treatment plan uses more than one approach and minimizes the chance of side effects. Be honest about the amount of medicines you take and about any drug or alcohol use. Get pain medicine prescriptions from only one health care provider. Pain can be managed with many types of alternative treatments. Ask your health care provider to refer you to one or more specialists who can help you manage pain through: Physical or occupational therapy. Counseling (cognitive behavioral therapy). Good nutrition. Biofeedback. Massage. Meditation. Non-opioid medicine. Following a gentle exercise program. How to use opioid pain medicine Taking medicine Take your pain medicine exactly as told by your health care provider. Take it only when you need it. If your pain  gets less severe, you may take less than your prescribed dose if your health care provider approves. If you are not having pain, do nottake pain medicine unless your health care provider tells you to take it. If your pain is severe, do nottry to treat it  yourself by taking more pills than instructed on your prescription. Contact your health care provider for help. Write down the times when you take your pain medicine. It is easy to become confused while on pain medicine. Writing the time can help you avoid overdose. Take other over-the-counter or prescription medicines only as told by your health care provider. Keeping yourself and others safe  While you are taking opioid pain medicine: Do not drive, use machinery, or power tools. Do not sign legal documents. Do not drink alcohol. Do not take sleeping pills. Do not supervise children by yourself. Do not do activities that require climbing or being in high places. Do not go to a lake, river, ocean, spa, or swimming pool. Do not share your pain medicine with anyone. Keep pain medicine in a locked cabinet or in a secure area where pets and children cannot reach it. Stopping your use of opioids If you have been taking opioid medicine for more than a few weeks, you may need to slowly decrease (taper) how much you take until you stop completely. Tapering your use of opioids can decrease your risk of symptoms of withdrawal, such as: Pain and cramping in the abdomen. Nausea. Sweating. Sleepiness. Restlessness. Uncontrollable shaking (tremors). Cravings for the medicine. Do not attempt to taper your use of opioids on your own. Talk with your health care provider about how to do this. Your health care provider may prescribe a step-down schedule based on how much medicine you are taking and how long you have been taking it. Getting rid of leftover pills Do not save any leftover pills. Get rid of leftover pills safely by: Taking the medicine to a prescription take-back program. This is usually offered by the county or law enforcement. Bringing them to a pharmacy that has a drug disposal container. Flushing them down the toilet. Check the label or package insert of your medicine to see whether this  is safe to do. Throwing them out in the trash. Check the label or package insert of your medicine to see whether this is safe to do. If it is safe to throw it out, remove the medicine from the original container, put it into a sealable bag or container, and mix it with used coffee grounds, food scraps, dirt, or cat litter before putting it in the trash. Follow these instructions at home: Activity Do exercises as told by your health care provider. Avoid activities that make your pain worse. Return to your normal activities as told by your health care provider. Ask your health care provider what activities are safe for you. General instructions You may need to take these actions to prevent or treat constipation: Drink enough fluid to keep your urine pale yellow. Take over-the-counter or prescription medicines. Eat foods that are high in fiber, such as beans, whole grains, and fresh fruits and vegetables. Limit foods that are high in fat and processed sugars, such as fried or sweet foods. Keep all follow-up visits. This is important. Where to find support If you have been taking opioids for a long time, you may benefit from receiving support for quitting from a local support group or counselor. Ask your health care provider for a referral to these resources  in your area. Where to find more information Centers for Disease Control and Prevention (CDC): footballexhibition.com.br [place] Food and Drug Administration (FDA): pumpkinsearch.com.ee Get help right away if: You may have taken too much of an opioid (overdosed). Common symptoms of an overdose: Your breathing is slower or more shallow than normal. You have a very slow heartbeat (pulse). You have slurred speech. You have nausea and vomiting. Your pupils become very small. You have other potential symptoms: You are very confused. You faint or feel like you will faint. You have cold, clammy skin. You have blue lips or fingernails. You have thoughts of harming  yourself or harming others. These symptoms may represent a serious problem that is an emergency. Do not wait to see if the symptoms will go away. Get medical help right away. Call your local emergency services (911 in the [place] Do not drive yourself to the hospital.  If you ever feel like you may hurt yourself or others, or have thoughts about taking your own life, get help right away. Go to your nearest emergency department or: Call your local emergency services (911 in the [place] Call the [facility] (878-053-3385 in the [place] Call a suicide crisis helpline, such as the National Suicide Prevention Lifeline at 980-491-1979 or 988 in the [place] This is open 24 hours a day in the [place] If youre a Veteran: Call 988 and press 1. This is open 24 hours a day. Text the [employer] at (862)447-1712. Summary Opioid medicines can help you manage moderate to severe pain for a short period of time. A pain treatment plan is an agreement between you and your health care provider. Discuss the goals of your treatment, including how much pain you might expect to have and how you will manage the pain. If you think that you or someone else may have taken too much of an opioid, get medical help right away. This information is not intended to replace advice given to you by your health care provider. Make sure you discuss any questions you have with your health care provider. Document Revised: 04/21/2023 Document Reviewed: 10/25/2020 Elsevier Patient Education  2024 Elsevier Inc.    07/14/2024    3:00 PM  Advanced Directives  Does Patient Have a Medical Advance Directive? No    Vision: Annual vision screenings are recommended for early detection of glaucoma, cataracts, and diabetic retinopathy. These exams can also reveal signs of chronic conditions such as diabetes and high blood pressure.  Dental: Annual dental screenings help detect early signs of oral cancer, gum disease, and other  conditions linked to overall health, including heart disease and diabetes.  Please see the attached documents for additional preventive care recommendations.

## 2024-07-14 NOTE — Progress Notes (Signed)
 Chief Complaint  Patient presents with   Medicare Wellness     Subjective:   Emily Cannon is a 89 y.o. female who presents for a Medicare Annual Wellness Visit.  Visit info / Clinical Intake: Medicare Wellness Visit Type:: Subsequent Annual Wellness Visit Persons participating in visit and providing information:: patient Medicare Wellness Visit Mode:: Telephone If telephone:: video declined Since this visit was completed virtually, some vitals may be partially provided or unavailable. Missing vitals are due to the limitations of the virtual format.: Documented vitals are patient reported If Telephone or Video please confirm:: I connected with patient using audio/video enable telemedicine. I verified patient identity with two identifiers, discussed telehealth limitations, and patient agreed to proceed. Patient Location:: Home Provider Location:: Office Interpreter Needed?: No Pre-visit prep was completed: yes AWV questionnaire completed by patient prior to visit?: no Living arrangements:: lives with spouse/significant other Patient's Overall Health Status Rating: good Typical amount of pain: none Does pain affect daily life?: no Are you currently prescribed opioids?: (!) yes  Dietary Habits and Nutritional Risks How many meals a day?: 2 Eats fruit and vegetables daily?: yes Most meals are obtained by: preparing own meals; eating out In the last 2 weeks, have you had any of the following?: none Diabetic:: no  Functional Status Activities of Daily Living (to include ambulation/medication): Independent Ambulation: Independent with device- listed below Home Assistive Devices/Equipment: Eyeglasses; Other (Comment); Walker (specify Type); Dentures (specify type) (Hearing Aids) Medication Administration: Independent (Husband assist) Home Management (perform basic housework or laundry): Needs assistance (comment) (Aide assist) Manage your own finances?: yes (Husband  assist) Primary transportation is: family / friends (Husband) Concerns about vision?: no *vision screening is required for WTM* Concerns about hearing?: (!) yes Uses hearing aids?: (!) yes Hear whispered voice?: (!) no *in-person visit only*  Fall Screening Falls in the past year?: 0 Number of falls in past year: 0 Was there an injury with Fall?: 0 Fall Risk Category Calculator: 0 Patient Fall Risk Level: Low Fall Risk  Fall Risk Patient at Risk for Falls Due to: No Fall Risks Fall risk Follow up: Falls evaluation completed  Home and Transportation Safety: All rugs have non-skid backing?: yes All stairs or steps have railings?: N/A, no stairs Grab bars in the bathtub or shower?: yes Have non-skid surface in bathtub or shower?: yes Good home lighting?: yes Regular seat belt use?: yes Hospital stays in the last year:: no  Cognitive Assessment Difficulty concentrating, remembering, or making decisions? : yes Will 6CIT or Mini Cog be Completed: yes What year is it?: 0 points What month is it?: 0 points Give patient an address phrase to remember (5 components): [address] [place]  About what time is it?: 0 points Count backwards from 20 to 1: 2 points Repeat the address phrase from earlier: 2 points  Advance Directives (For Healthcare) Does Patient Have a Medical Advance Directive?: No Does patient want to make changes to medical advance directive?: No - Patient declined Type of Advance Directive: Healthcare Power of Attorney Copy of Healthcare Power of Attorney in Chart?: No - copy requested  Reviewed/Updated  Reviewed/Updated: Reviewed All (Medical, Surgical, Family, Medications, Allergies, Care Teams, Patient Goals)    Allergies (verified) Alendronate sodium, Amoxicillin, Cephalexin , Codeine, and Valsartan   Current Medications (verified) Outpatient Encounter Medications as of 07/14/2024  Medication Sig   acetaminophen  (TYLENOL ) 500 MG tablet Take 500 mg  by mouth every 6 (six) hours as needed.   amLODipine  (NORVASC ) 5 MG tablet  Take 5 mg by mouth daily.   Ascorbic Acid (VITAMIN C ) 100 MG tablet Take 1 tablet (100 mg total) by mouth daily.   atorvastatin  (LIPITOR) 20 MG tablet TAKE 1 TABLET BY MOUTH DAILY   Avacopan (TAVNEOS) 10 MG CAPS Take 2 capsules by mouth twice daily with meals.   Cholecalciferol 25 MCG (1000 UT) tablet Take 1 capsule by mouth daily.   clindamycin  (CLEOCIN ) 300 MG capsule Take 1 capsule (300 mg total) by mouth 4 (four) times daily. X 7 days   esomeprazole  (NEXIUM ) 40 MG capsule TAKE 1 CAPSULE BY MOUTH DAILY   furosemide  (LASIX ) 40 MG tablet Take 0.5 tablets (20 mg total) by mouth 3 (three) times a week. Monday, Wed, Friday (Patient taking differently: Take 40 mg by mouth every Monday, Wednesday, and Friday.)   gabapentin  (NEURONTIN ) 100 MG capsule Take 2 capsules by mouth at night for neuropathy pain (Patient taking differently: 100 mg 2 (two) times daily. Take 2 capsules by mouth at night for neuropathy pain)   levothyroxine  (SYNTHROID ) 50 MCG tablet TAKE 1 TABLET BY MOUTH EVERY MORNING BEFORE BREAKFAST   metoprolol  succinate (TOPROL -XL) 50 MG 24 hr tablet TAKE 1 TABLET BY MOUTH 2 TIMES A DAY WITH OR IMMEDIATELY FOLLOWING A MEAL   Multiple Vitamins-Minerals (CENTRUM SILVER 50+WOMEN) TABS Take 1 tablet by mouth daily with breakfast.   traMADol  (ULTRAM ) 50 MG tablet TAKE ONE TABLET BY MOUTH EVERY 6 HOURS AS NEEDED FOR UP TO 5 DAYS   valACYclovir  (VALTREX ) 500 MG tablet Take 500 mg by mouth 2 (two) times daily.   VASCEPA  1 g capsule TAKE 2 CAPSULES BY MOUTH 2 TIMES A DAY   No facility-administered encounter medications on file as of 07/14/2024.    History: Past Medical History:  Diagnosis Date   Allergic rhinitis, cause unspecified    Allergy    Anxiety state, unspecified    Blood transfusion without reported diagnosis    2020   Cataract    removed both eyes    Chronic kidney disease    Disorder of bone and  cartilage, unspecified    Esophageal candidiasis (HCC)    Foot drop    Gastric ulcer    History of CMV    History of Helicobacter pylori infection    Hypothyroid    Irritable bowel syndrome    Lumbago    Microscopic polyangiitis (HCC)    Other and unspecified hyperlipidemia    Pancreatic cyst    Perforation of tympanic membrane, unspecified    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, bilateral (HCC)    Unspecified essential hypertension    Past Surgical History:  Procedure Laterality Date   ABDOMINAL HYSTERECTOMY     BIOPSY  01/02/2019   Procedure: BIOPSY;  Surgeon: Leigh Elspeth SQUIBB, MD;  Location: [facility] ENDOSCOPY;  Service: Gastroenterology;;   cataract surgery  5/12 and 12/24/2012   both eyes   COLONOSCOPY     COLONOSCOPY WITH PROPOFOL  N/A 01/02/2019   Procedure: COLONOSCOPY WITH PROPOFOL ;  Surgeon: Leigh Elspeth SQUIBB, MD;  Location: [facility] ENDOSCOPY;  Service: Gastroenterology;  Laterality: N/A;   ESOPHAGOGASTRODUODENOSCOPY (EGD) WITH PROPOFOL  N/A 01/02/2019   Procedure: ESOPHAGOGASTRODUODENOSCOPY (EGD) WITH PROPOFOL ;  Surgeon: Leigh Elspeth SQUIBB, MD;  Location: [facility] ENDOSCOPY;  Service: Gastroenterology;  Laterality: N/A;   NSVD     x1   POLYPECTOMY  01/02/2019   Procedure: POLYPECTOMY;  Surgeon: Leigh Elspeth SQUIBB, MD;  Location: [facility] ENDOSCOPY;  Service: Gastroenterology;;   POLYPECTOMY     TONSILLECTOMY  UPPER GASTROINTESTINAL ENDOSCOPY     Family History  Problem Relation Age of Onset   Lung cancer Mother 36   Lung disease Father 24   COPD Other        sibling   Colon cancer Neg Hx    Colon polyps Neg Hx    Esophageal cancer Neg Hx    Rectal cancer Neg Hx    Stomach cancer Neg Hx    Social History   Occupational History   Occupation: retired scientist, physiological  Tobacco Use   Smoking status: Never   Smokeless tobacco: Never  Vaping Use   Vaping status: Never Used  Substance and Sexual Activity   Alcohol use: No   Drug use: No   Sexual activity: Not Currently    Tobacco Counseling Counseling given: No  SDOH Screenings   Food Insecurity: No Food Insecurity (07/14/2024)  Housing: Unknown (07/14/2024)  Transportation Needs: No Transportation Needs (07/14/2024)  Utilities: Not At Risk (07/14/2024)  Alcohol Screen: Low Risk (02/20/2022)  Depression (PHQ2-9): Low Risk (07/14/2024)  Financial Resource Strain: Low Risk (02/20/2022)  Physical Activity: Insufficiently Active (07/14/2024)  Social Connections: Socially Integrated (07/14/2024)  Stress: No Stress Concern Present (07/14/2024)  Tobacco Use: Low Risk (07/14/2024)  Health Literacy: Adequate Health Literacy (07/14/2024)   See flowsheets for full screening details  Depression Screen PHQ 2 & 9 Depression Scale- Over the past 2 weeks, how often have you been bothered by any of the following problems? Little interest or pleasure in doing things: 0 Feeling down, depressed, or hopeless (PHQ Adolescent also includes...irritable): 0 PHQ-2 Total Score: 0     Goals Addressed               This Visit's Progress     Increase physical activity (pt-stated)        Get more active.             Objective:    Today's Vitals   07/14/24 1500  Weight: 136 lb (61.7 kg)  Height: 5' 1 (1.549 m)   Body mass index is 25.7 kg/m.  Hearing/Vision screen Hearing Screening - Comments:: Wears Hearing Aids Vision Screening - Comments:: Wears rx glasses - up to date with routine eye exams with  Dr Octavia Immunizations and Health Maintenance Health Maintenance  Topic Date Due   COVID-19 Vaccine (7 - Mixed Product risk 2025-26 season) 11/01/2024   Medicare Annual Wellness (AWV)  07/14/2025   DTaP/Tdap/Td (3 - Td or Tdap) 01/22/2032   Pneumococcal Vaccine: 50+ Years  Completed   Influenza Vaccine  Completed   Bone Density Scan  Completed   Zoster Vaccines- Shingrix  Completed   Meningococcal B Vaccine  Aged Out        Assessment/Plan:  This is a routine wellness examination for  Mckinnley.  Patient Care Team: Micheal Wolm ORN, MD as PCP - General (Family Medicine) Pietro Redell RAMAN, MD as Consulting Physician (Cardiology) Bonner Ade, MD as Consulting Physician (Physical Medicine and Rehabilitation) Mai Lynwood FALCON, MD as Consulting Physician (Rheumatology) Onita Duos, MD as Consulting Physician (Neurology) Armbruster, Elspeth SQUIBB, MD as Consulting Physician (Gastroenterology) Brenna Adine CROME, DO as Consulting Physician (Pulmonary Disease)  I have personally reviewed and noted the following in the patients chart:   Medical and social history Use of alcohol, tobacco or illicit drugs  Current medications and supplements including opioid prescriptions. Functional ability and status Nutritional status Physical activity Advanced directives List of other physicians Hospitalizations, surgeries, and ER visits in previous 12 months Vitals  Screenings to include cognitive, depression, and falls Referrals and appointments  No orders of the defined types were placed in this encounter.  In addition, I have reviewed and discussed with patient certain preventive protocols, quality metrics, and best practice recommendations. A written personalized care plan for preventive services as well as general preventive health recommendations were provided to patient.   Rojelio LELON Blush, LPN   [id number]/[id number]   Return in 53 weeks (on 07/20/2025).  After Visit Summary: (MyChart) Due to this being a telephonic visit, the after visit summary with patients personalized plan was offered to patient via MyChart   Nurse Notes: No voiced or noted concerns at this time

## 2024-08-17 ENCOUNTER — Encounter (HOSPITAL_COMMUNITY): Payer: Self-pay

## 2024-08-24 ENCOUNTER — Telehealth: Payer: Self-pay

## 2024-08-24 MED ORDER — ICOSAPENT ETHYL 1 G PO CAPS: 2.0000 g | ORAL_CAPSULE | Freq: Two times a day (BID) | ORAL | 3 refills | Status: AC

## 2024-08-24 NOTE — Telephone Encounter (Signed)
Patients spouse informed of message below.

## 2024-08-24 NOTE — Addendum Note (Signed)
 Addended by: MICHEAL WOLM ORN on: 08/24/2024 10:26 AM   Modules accepted: Orders

## 2024-08-24 NOTE — Telephone Encounter (Signed)
 Copied from CRM #[id number]. Topic: Clinical - Medication Question >> Aug 23, 2024  8:41 AM Larissa RAMAN wrote: Reason for CRM: Spouse requesting an alternative medication for VASCEPA  1 g capsule. States the cost of the medication is to high.

## 2024-09-01 ENCOUNTER — Ambulatory Visit: Admitting: Podiatry

## 2024-09-01 DIAGNOSIS — B351 Tinea unguium: Secondary | ICD-10-CM

## 2024-09-02 NOTE — Progress Notes (Signed)
 Subjective:   Patient ID: Emily Cannon, female   DOB: 89 y.o.   MRN: [id number]   HPI Patient presents with elongated nailbeds 1-5 both feet that are irritated for her and she presents today with caregiver   ROS      Objective:  Physical Exam  Neurovascular status unchanged with irritated incurvated nailbeds 1-5 both feet that become thick and impossible for caregiver to cut     Assessment:  Chronic mycotic nail infection 1-5 both feet     Plan:  Debridement painful nailbeds 1-5 both feet no iatrogenic bleeding reappoint for routine care

## 2024-11-10 ENCOUNTER — Ambulatory Visit: Admitting: Podiatry

## 2025-07-20 ENCOUNTER — Ambulatory Visit
